# Patient Record
Sex: Male | Born: 1955 | Race: White | Hispanic: No | Marital: Married | Smoking: Never smoker
Health system: Southern US, Community
[De-identification: ages and names within clinical notes are randomized; demographics above are authoritative.]

## PROBLEM LIST (undated history)

## (undated) DIAGNOSIS — I10 Essential (primary) hypertension: Secondary | ICD-10-CM

## (undated) DIAGNOSIS — Z9861 Coronary angioplasty status: Secondary | ICD-10-CM

## (undated) DIAGNOSIS — C801 Malignant (primary) neoplasm, unspecified: Secondary | ICD-10-CM

## (undated) DIAGNOSIS — K219 Gastro-esophageal reflux disease without esophagitis: Secondary | ICD-10-CM

## (undated) DIAGNOSIS — I341 Nonrheumatic mitral (valve) prolapse: Secondary | ICD-10-CM

## (undated) DIAGNOSIS — Z9289 Personal history of other medical treatment: Secondary | ICD-10-CM

## (undated) DIAGNOSIS — E785 Hyperlipidemia, unspecified: Secondary | ICD-10-CM

## (undated) DIAGNOSIS — I2510 Atherosclerotic heart disease of native coronary artery without angina pectoris: Secondary | ICD-10-CM

## (undated) DIAGNOSIS — Z923 Personal history of irradiation: Secondary | ICD-10-CM

## (undated) DIAGNOSIS — I2109 ST elevation (STEMI) myocardial infarction involving other coronary artery of anterior wall: Secondary | ICD-10-CM

## (undated) DIAGNOSIS — Z9889 Other specified postprocedural states: Secondary | ICD-10-CM

## (undated) DIAGNOSIS — I340 Nonrheumatic mitral (valve) insufficiency: Secondary | ICD-10-CM

## (undated) DIAGNOSIS — R112 Nausea with vomiting, unspecified: Secondary | ICD-10-CM

## (undated) DIAGNOSIS — R911 Solitary pulmonary nodule: Secondary | ICD-10-CM

## (undated) DIAGNOSIS — C3491 Malignant neoplasm of unspecified part of right bronchus or lung: Secondary | ICD-10-CM

## (undated) HISTORY — DX: Malignant neoplasm of unspecified part of right bronchus or lung: C34.91

## (undated) HISTORY — DX: Gastro-esophageal reflux disease without esophagitis: K21.9

## (undated) HISTORY — DX: Other specified postprocedural states: Z98.890

## (undated) HISTORY — PX: COLONOSCOPY: SHX174

## (undated) HISTORY — DX: Hyperlipidemia, unspecified: E78.5

## (undated) HISTORY — DX: Solitary pulmonary nodule: R91.1

## (undated) HISTORY — DX: Nonrheumatic mitral (valve) prolapse: I34.1

## (undated) HISTORY — DX: ST elevation (STEMI) myocardial infarction involving other coronary artery of anterior wall: I21.09

## (undated) HISTORY — DX: Atherosclerotic heart disease of native coronary artery without angina pectoris: I25.10

## (undated) HISTORY — DX: Nonrheumatic mitral (valve) insufficiency: I34.0

## (undated) HISTORY — PX: CARDIAC CATHETERIZATION: SHX172

## (undated) HISTORY — DX: Coronary angioplasty status: Z98.61

## (undated) HISTORY — DX: Essential (primary) hypertension: I10

## (undated) HISTORY — DX: Personal history of other medical treatment: Z92.89

---
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
## 1991-08-15 HISTORY — PX: ANTERIOR CRUCIATE LIGAMENT REPAIR: SHX115

## 2002-08-14 DIAGNOSIS — I2510 Atherosclerotic heart disease of native coronary artery without angina pectoris: Secondary | ICD-10-CM

## 2002-08-14 DIAGNOSIS — I2109 ST elevation (STEMI) myocardial infarction involving other coronary artery of anterior wall: Secondary | ICD-10-CM

## 2002-08-14 DIAGNOSIS — Z9861 Coronary angioplasty status: Secondary | ICD-10-CM

## 2002-08-14 HISTORY — DX: Coronary angioplasty status: Z98.61

## 2002-08-14 HISTORY — DX: Atherosclerotic heart disease of native coronary artery without angina pectoris: I25.10

## 2002-08-14 HISTORY — DX: ST elevation (STEMI) myocardial infarction involving other coronary artery of anterior wall: I21.09

## 2002-08-14 HISTORY — PX: CARDIAC CATHETERIZATION: SHX172

## 2002-11-06 ENCOUNTER — Inpatient Hospital Stay (HOSPITAL_COMMUNITY): Admission: EM | Admit: 2002-11-06 | Discharge: 2002-11-09 | Payer: Self-pay | Admitting: Emergency Medicine

## 2002-11-10 ENCOUNTER — Inpatient Hospital Stay (HOSPITAL_COMMUNITY): Admission: EM | Admit: 2002-11-10 | Discharge: 2002-11-11 | Payer: Self-pay | Admitting: Emergency Medicine

## 2004-09-13 ENCOUNTER — Encounter: Admission: RE | Admit: 2004-09-13 | Discharge: 2004-09-13 | Payer: Self-pay | Admitting: Family Medicine

## 2006-05-07 ENCOUNTER — Observation Stay (HOSPITAL_COMMUNITY): Admission: EM | Admit: 2006-05-07 | Discharge: 2006-05-07 | Payer: Self-pay | Admitting: Emergency Medicine

## 2009-03-24 ENCOUNTER — Ambulatory Visit: Payer: Self-pay | Admitting: Sports Medicine

## 2009-03-24 DIAGNOSIS — M7750 Other enthesopathy of unspecified foot: Secondary | ICD-10-CM | POA: Insufficient documentation

## 2009-04-29 ENCOUNTER — Ambulatory Visit: Payer: Self-pay | Admitting: Sports Medicine

## 2009-12-12 HISTORY — PX: NM MYOVIEW LTD: HXRAD82

## 2010-12-30 NOTE — Discharge Summary (Signed)
NAME:  [name], [name] NO.:  [id number]   MEDICAL RECORD NO.:  [id number]          PATIENT TYPE:  INP   LOCATION:  2807                         FACILITY:  [facility]   PHYSICIAN:  Cristy Hilts. Jacinto Halim, MD       DATE OF BIRTH:  06/18/1956   DATE OF ADMISSION:  05/07/2006  DATE OF DISCHARGE:  05/07/2006                                 DISCHARGE SUMMARY   HISTORY:  The patient was never actually admitted.  [name] is a 55-year-  old white married male patient of Dr. Lenise Herald who came to the  hospital who complains of chest pressure, shortness of breath,  lightheadedness and bilateral arm heaviness.  He states it started over the  weekend when he mowed his grass.  He had to keep stopping.  He felt like  something was standing on his chest.  He rated it a 6/10.  He played golf  and he had no symptoms Sunday; he was just very tired.  Today at work he was  just sitting and symptoms recurred.  He did not try to take any  nitroglycerin.  He called the office and he was told to come to the ER.  His  symptoms with his previous MI were some mild lightheadedness and eventual  bilateral arm numbness.  He was seen by Dr. Jacinto Halim, who decided to take him  to the cath lab.  He underwent cardiac catheterization.  He had no  significant change in his coronary anatomy since 2004.  He has a LAD stent  that was widely patent.  It was a Cypher stent.  He had no thrombus.  He had  a distal 40% lesion in his LAD.  He had a 50% lesion in the side branch of  his RCA and a 30% lesion in his midcircumflex.  He was Perclosed and decided  that he could go home that evening.   LABORATORY:  His hemoglobin was 14.5, hematocrit 41, WBCs 4.4, platelets  were 204.  His sodium is 141, potassium 4.1, BUN 13, creatinine 0.9.  AST  was 32, ALT was 21.  TSH was 1.004.  I do not see a chest x-ray report in  this chart at the time of this dictation.   DISCHARGE MEDICATIONS:  Same meds as previously prior to  hospitalization.  His activity is restricted for a week.  He cannot do any strenuous lifting,  pushing, pulling or exercise.   DIAGNOSIS:  1. Chest pain not cardiac ischemia, etiology unknown.  2. Known coronary artery disease.  3. Mitral valve prolapse found on the cardiac catheterization.  He has a      normal ejection fraction.  4. Dyslipidemia.  5. Gastroesophageal reflux disease, previously on Prilosec.      Alan [name], N.P.      Cristy Hilts. Jacinto Halim, MD  Electronically Signed    BB/MEDQ  D:  06/01/2006  T:  06/02/2006  Job:  [id number]   cc:   L. Lupe Carney, M.D.  Darlin Priestly, MD

## 2010-12-30 NOTE — Discharge Summary (Signed)
NAME:  Alan [name], Alan [name]                        ACCOUNT NO.:  [id number]   MEDICAL RECORD NO.:  [id number]                   PATIENT TYPE:  INP   LOCATION:  4733                                 FACILITY:  [facility]   PHYSICIAN:  Darlin Priestly, M.D.             DATE OF BIRTH:  07/16/1956   DATE OF ADMISSION:  11/10/2002  DATE OF DISCHARGE:  11/11/2002                                 DISCHARGE SUMMARY   DISCHARGE DIAGNOSES:  1. Orthostatic hypotensive and syncope.  2. Coronary artery disease with recent myocardial infarction secondary to     left anterior descending artery occlusion, treated with left anterior     descending artery stenting.  3. Mild left ventricle dysfunction with an ejection fraction of 40 to 45%.  4. PENICILLIN allergy.  5. Hyperlipidemia.   HOSPITAL COURSE:  The patient is a 55 year old male who was discharged on  November 09, 2002 after an MI treated with LAD Cypher stenting.  His EF was 40  to 45%.  Prior to his catheterization he did have two episodes of VF and was  shocked.  He was discharged November 09, 2002.  That night he had an episode of  orthostatic dizziness.  On the day of admission, November 10, 2002 he had frank  syncope after getting up.  He did hit his right lateral chest.  He was seen  in the emergency room and admitted.  His EKG showed sinus rhythm without  acute changes.  CK-MB were negative although his troponin is still elevated  from his MI.  Hemoglobin was 15.  He was given IV fluids and his Altace was  held and Toprol cut in half.  On November 11, 2002 he is up, ambulating without  problems and has had no further orthostatic symptoms.  We feel he can be  discharged.  Dr. Jacinto Halim would like to get an outpatient echocardiogram.  The  patient will be discharged on November 11, 2002.   DISCHARGE MEDICATIONS:  1. Aspirin 81 mg q.d.  2. Plavix 75 mg q.d.  3. Toprol XL 12.5 mg q.d.  4. Lipitor 20 mg q.d.  5. Protonix 40 mg q.d.  6. Vioxx p.r.n.   LABORATORY DATA:  White count 6.7, hemoglobin 15.1, hematocrit 43.5,  platelets 213.  Sodium 131, potassium 4.1, BUN 18, creatinine 0.8, glucose  91.  INR 0.9.  CK 133, MB 2.2, troponin still elevated at 1.29.  His  troponin on November 08, 2002 was 4.16.   Rib films were normal showing no fracture.  EKG showed sinus rhythm without  acute changes.  His Q-Tc is 423.   DISPOSITION:  The patient is discharged in stable condition.  He has been  instructed not to drive or do anything strenuous for the next few days.  He  will stop his Altace and we cut his Toprol to 12.5 mg a day.  He will have  an echocardiogram and  see Dr. Jenne Campus on the same day, December 02, 2002.     Abelino Derrick, P.A.                      Darlin Priestly, M.D.   Lenard Lance  D:  11/11/2002  T:  11/11/2002  Job:  [id number]

## 2010-12-30 NOTE — Discharge Summary (Signed)
NAME:  Alan [name], Alan [name]                        ACCOUNT NO.:  [id number]   MEDICAL RECORD NO.:  [id number]                   PATIENT TYPE:  INP   LOCATION:  3703                                 FACILITY:  [facility]   PHYSICIAN:  Darlin Priestly, M.D.             DATE OF BIRTH:  26-Oct-1955   DATE OF ADMISSION:  11/06/2002  DATE OF DISCHARGE:  11/09/2002                                 DISCHARGE SUMMARY   ADMISSION DIAGNOSES:  1. Acute myocardial infarction.  2. Family history of coronary artery disease.   DISCHARGE DIAGNOSES:  1. Acute myocardial infarction.  2. Family history of coronary artery disease.  3. Status post emergent cardiac catheterization on November 06, 2002 by Dr.     Lenise Herald.  The left anterior descending was found to have proximal     thrombus and was 99% occluded in that region.  Dr. Jenne Campus performed     AngioJet x2 runs there with intravascular ultrasound-guided intervention.     He performed percutaneous transluminal coronary angioplasty and stent     with two Cypher stents to the proximal left anterior descending.  There     was some other residual coronary disease that was planned to continue     medical therapy for.  Ejection fraction was 40%-45%.   HISTORY OF PRESENT ILLNESS:  The patient is a pleasant 55 year old white  male who had no prior medical history who presented to the emergency room on  November 06, 2002 with complaints of abrupt onset of chest pain after running  3.5 miles that afternoon.  He normally runs about 2-3 miles per day without  any significant symptoms; however, after running on this evening he  developed left chest pain with shortness of breath which radiated into both  arms.  As well, he did have associated nausea and diaphoresis.  He attempted  to drive himself to [facility] but had to stop secondary to  recurrent and ongoing chest pain and therefore called EMS.  He had no prior  history of chest pain or heart failure.   He was given two sublingual  nitroglycerin by EMS with brief relief of his chest pain, which returned  again in the ER.  At the time of Dr. Mikey Bussing evaluation, he was  complaining of chest pain radiating to the left arm despite being in the ER  on IV nitroglycerin.  There was no history of peptic ulcer disease or GI  bleeds.  No significant known risk factors for coronary disease.  At that  time, he was stable with a heart rate of 87, blood pressure 118/83, oxygen  saturation 99% on room air.  No significant abnormalities to physical exam.  Labs were pending.  EKG shows normal sinus rhythm and anterior ST elevation.   At that time, he was evaluated by Dr. Lenise Herald.  It was felt that he  needed to proceed with urgent cardiac  catheterization.  Risks and benefits  of the procedure were discussed and the patient was willing to proceed.  At  that point, he was continued with IV heparin, IV nitroglycerin, and aspirin  therapy.  Family was at the bedside and was agreeable.   HOSPITAL COURSE:  On November 06, 2002, the patient underwent urgent cardiac  catheterization by Dr. Lenise Herald.  Please see the dictated report for  details.  It was felt that the culprit lesion was an area in the proximal  LAD that had thrombus and it was 99% stenosed.  He performed AngioJet x2  runs there.  He performed IVUS of the vessel.  He proceeded with stenting x2  with two Cypher stents.  As well, there was PTCA with a Maverick.  Planned  to continue Integrilin for 18 hours post intervention.  Plavix for six  months.  We would add statin, beta-blocker, ACE inhibitor, and proton pump  in addition to his other heart medicines that we had begun.  EF was 40%-45%  with anterior and apical hypokinesis, mild MR.  The patient tolerated the  procedure with no complications.  During the heart catheterization  procedure, the patient had ventricular fibrillation x2 with successful  cardioversion x2 at 200 joules.    On that evening, just a little after midnight, the patient complained of  nausea, feeling lightheaded.  Cold washcloths were administered to the  forehead and neck and the patient was feeling better; however, shortly  thereafter the sheath was pulled and the patient complained of nausea and  feeling faint.  Blood pressure was 88/45, heart rate 70.  They gave an amp  of atropine with blood pressure increased to 104/60 and heart rate 64.  This  was followed by Zofran.   The following day, on November 07, 2002, he had no other arrhythmias and was  continued on IV amiodarone. At that time, Dr. Jenne Campus planned to stop the IV  amiodarone.  As well, it was thought that the patient would need followup  evaluation of the PDA disease in several weeks.   On November 08, 2002, the patient remained stable.  He was having no further  chest pain and was maintaining sinus rhythm.  We continued to adjust  medications and monitor him post MI.   On November 09, 2002, again the patient was stable with no chest pain or  shortness of breath.  He was tolerating full activity with rehab with no  problems.  He was afebrile at 98.1, pulse 65, blood pressure 108/70, oxygen  saturation 95% on room air.  Labs have remained stable.  He has maintained  sinus rhythm with no arrhythmias.  His exam is stable and there is no bleed  or hematoma at the groin site.   At this time, he is seen and evaluated by Dr. Charolette Child, who deems him  stable for discharge home.   CONSULTATIONS:  None.   PROCEDURES:  Emergent cardiac catheterization on November 06, 2002 by Dr.  Lenise Herald.  Please see the dictated report for details.  The patient  was found to have the following:  The right coronary artery was free of  significant disease; however, the posterior descending artery branch has a  90% proximal stenosis.  The left main was free of significant disease.  The left circumflex and its branches are also free of significant  disease;  however, the left anterior descending in its proximal portion has  significant thrombus and is 99% stenosed.  Distal to this it has a 40%  stenosis.  The first diagonal vessel is small but has an ostial 70%  stenosis.  Ejection fraction was estimated at 40%-45% with anterior and  apical hypokinesis.  Mild mitral regurgitation.  Dr. Jenne Campus proceeded with  AngioJet x2 runs to the proximal left anterior descending.  He then  performed intravascular ultrasound interrogation.  He then proceeded with  stenting x2 with Cypher stents.  As well, he used Maverick balloons.  Note  that the patient did have ventricular fibrillation x2 with successful shock  x2 at 200 joules.  As well, apparently amiodarone was subsequently begun.  At that time, Dr. Jenne Campus planned to continue Integrilin for 18 hours post  intervention.  Planned to continue Plavix for six months post drug-eluting  stents.  Start statin, beta-blocker, ACE inhibitor, proton pump, in addition  to the aspirin that was already started.   LABORATORY DATA:  TSH normal at 1.112.  Lipid profile showed total  cholesterol 184, triglycerides 55, HDL 50, LDL 123.  Cardiac enzymes showed  CK's of 1035, 781.  CK-MB's were 62.3, 14.2.  Troponin-I 7.56, 4.16.  Electrolytes showed sodium 141, potassium 2.7 which was repleted up to 3.8.  Glucose 141, BUN 20, creatinine 0.8.  These remained stable.  White count  8.4, hemoglobin 14.4, hematocrit 40.8, and platelets 210.  Pro time 13.1,  INR 0.9, PTT 28.   EKG on admission shows normal sinus rhythm, 85 beats per minute.  There was  about 2-3 mm of ST elevation in V2 and about 4 mm of ST elevation in V3-V4.  There is ST depression in leads II, III, aVF, and scooping effect there.   Followup EKG on November 06, 2002 post intervention is much improved.   DISCHARGE MEDICATIONS:  1. Enteric-coated aspirin 325 mg once a day.  2. Plavix 75 mg once a day.  3. Toprol XL 25 mg once a day.  4. Protonix  40 mg once a day for one month.  5. Lipitor 20 mg once a day.  6. Altace 5 mg once a day.  7. Nitroglycerin 0.4 mg sublingual as directed for chest pain.   ACTIVITY:  No strenuous activity, lifting greater than 5 pounds, driving, or  sexual activity for three days.   DIET:  Low-salt/low-fat/low-cholesterol diet.   WOUND CARE:  May gently wash the groin site with warm water and soap.  Call  [phone] for any bleeding or increased size or pain of the groin site.   FOLLOW UP:  On Monday, call [phone] to make an appointment to see Dr.  Jenne Campus in two weeks.   On an outpatient basis, in about 4-6 months, Dr. Jenne Campus had planned to  proceed with possible Cardiolite to evaluate the posterior descending artery  territory.     Mary B. Easley, P.A.-C.                   Darlin Priestly, M.D.    MBE/MEDQ  D:  12/01/2002  T:  12/02/2002  Job:  [id number]

## 2010-12-30 NOTE — Cardiovascular Report (Signed)
NAME:  JAMARRIUS, SALAY NO.:  [id number]   MEDICAL RECORD NO.:  [id number]          PATIENT TYPE:  INP   LOCATION:  2807                         FACILITY:  [facility]   PHYSICIAN:  Cristy Hilts. Jacinto Halim, MD       DATE OF BIRTH:  02-Jun-1956   DATE OF PROCEDURE:  05/07/2006  DATE OF DISCHARGE:  05/07/2006                              CARDIAC CATHETERIZATION   REFERRING PHYSICIAN:  L. Lupe Carney, M.D.   PROCEDURE PERFORMED:  1. Left ventriculography.  2. Selective right and left coronary arteriography.  3. Right femoral angiography and closure of the right femoral arterial      access with StarClose.   INDICATIONS:  Mr. Marty Uy is a 55 year old gentleman with history of  known coronary artery disease with acute anterior wall myocardial infarction  in March 2004.  He has had implantation of a 3.5 x 18 mm Cypher into the mid-  LAD, and a 3.5 x 8 mm Cypher into the proximal LAD, overlapping stents.  He  now was readmitted to the hospital through the emergency room with chest  pain.  Given his atypical presentation previously, and because of worrisome  symptoms, and also the patient's concern, he was brought to the cardiac  catheterization lab for direct evaluation of his coronary anatomy for  progression of coronary disease.   HEMODYNAMIC DATA:  The left ventricular pressures were 116/3, with end-  diastolic pressure of 14 mmHg.  The aortic pressures was 114/74, with a mean  of 92 mmHg.  There was no pressure gradient across the aortic valve.   ANGIOGRAPHIC DATA:  Left ventricle:  The LV systolic function was normal,  with ejection fraction of 55%.  There was mild mid to distal anterolateral  and apical hypokinesis.  There was mild mitral regurgitation, with evidence  of mild mitral valve prolapse.   Right coronary artery:  Right coronary artery is a large-caliber vessel and  a dominant vessel.  It has mild luminal irregularity.  There is a large PDA  and a moderate  to large PLA.  The PLA secondary branch has a 50% to at most  60% stenosis in its ostium, which is significantly improved from around 90%  from previous cardiac catheterization.   Left main:  Left main is a large-caliber vessel.  It is smooth and normal.   Circumflex:  Circumflex is a moderate-caliber vessel.  It has mild luminal  irregularity.   LAD:  LAD is a large-caliber vessel.  The previously placed stent is widely  opened.  The LAD gives origin to several small diagonals.  They are widely  patent.  There is a 40% stenosis in the distal LAD, and there is also a kink  noted at the same region.  However, this lesion did not appear to be  significant.   IMPRESSION:  1. No significant change in coronary anatomy since 2004.  The previously      placed Cypher stents into the proximal and mid-LAD are widely patent.  2. The chest pain could be secondary to mild coronary vasospasm versus  GERD.   RECOMMENDATIONS:  Will add 30 mg of Imdur to his current regimen, and ask  the patient to take Protonix one hour prior to his meal.  I will take him  off of work for a week, and he can return to work the following Monday.  He  will follow up with Dr. Lenise Herald in two weeks.   A total of 120 cc of contrast was utilized for diagnostic angiography.   TECHNIQUE OF THE PROCEDURE:  Under usual sterile precautions, using a 6  French right femoral arterial access, a 6 [place] multipurpose B2 catheter  was advanced into the ascending aorta over a 0.035 J-wire.  The catheter was  gently advanced to the left ventricle.  Left ventricular pressures were  monitored.  Hand contrast injection of the left ventricle was performed both  in LAO and RAO projections.  The catheter was flushed with saline, pulled  back into the ascending aorta, and the pressure gradient across the aortic  valve was monitored.  Right coronary artery was selectively engaged, and  angiography was performed.  Then, the left  main coronary artery was  selectively engaged, and angiography was performed.  Because of inadequate  visualization, a 6 French Judkins left side diagnostic catheter was advanced  and engaged in the left main coronary artery.  Angiography was repeated.  Then, the catheter was pulled out of the body in the usual fashion. Right  femoral angiography was performed through the arterial access sheath, and  the access was closed with StarClose, with excellent hemostasis.  The  patient tolerated the procedure well.  There were no immediate complications  reported.      Cristy Hilts. Jacinto Halim, MD  Electronically Signed     JRG/MEDQ  D:  05/07/2006  T:  05/09/2006  Job:  [id number]   cc:   L. Lupe Carney, M.D.  Darlin Priestly, MD

## 2010-12-30 NOTE — Cardiovascular Report (Signed)
NAME:  Alan [name], Alan [name]                        ACCOUNT NO.:  [id number]   MEDICAL RECORD NO.:  [id number]                   PATIENT TYPE:  INP   LOCATION:  1823                                 FACILITY:  [facility]   PHYSICIAN:  Darlin Priestly, M.D.             DATE OF BIRTH:  [birth year]   DATE OF PROCEDURE:  11/06/2002  DATE OF DISCHARGE:                              CARDIAC CATHETERIZATION   PROCEDURES:  1. Left heart catheterization.  2. Coronary angiography.  3. Left ventriculogram.  4. Insertion of temporary transvenous pacer wire.  5. Left anterior descending, proximal:     a. AngioJet rheolytic therapy.     b. Intravascular ultrasound imaging.     c. Placement of intracoronary stent x2.     d. Percutaneous transluminal coronary angioplasty.   SURGEON:  Darlin Priestly, M.D.   COMPLICATIONS:  None.   The patient did have two VF episodes in the catheterization lab, which were  successfully shocked at 200 joules to sinus rhythm.   INDICATIONS:  The patient is a 54 year old male with no significant previous  medical history.  The patient was running at approximately 4 p.m. when he  developed substernal chest pain approximately 5 p.m. radiating into both  arms with shortness of breath, nausea, diaphoresis.  He subsequently  presented to the ER, where he was found to have anterior ST elevation.  He  is now brought emergently for percutaneous intervention.   DESCRIPTION OF PROCEDURE:  After getting formal written consent, the patient  was brought to the cardiac catheterization, where the right and left groin  were shaved, prepped, and draped in the usual sterile fashion.  The  procedure was begun with a stab incision and modified Seldinger technique, a  #6 French arterial sheath was inserted in the right femoral vein and a #7  [place] arterial sheath inserted in the right femoral artery.  Next a  transvenous pacing wire was then floated into the RV apex and pacing  thresholds were then determined.  Next a #6 [place] JR4 diagnostic catheter  was then used to coaxially engage the right coronary ostium.  This revealed  a large right coronary artery which was dominant and gave rise to both a PDA  as well as a posterolateral branch.  There was no significant disease in the  RCA.  The PDA was a medium-sized vessel with no significant disease.  The  PLA was a large vessel which bifurcated proximally and had 90% ostial lesion  in the lower bifurcation.   The right coronary catheter was then exchanged for a #7 [place] guiding  catheter, which was then coaxially engaged in the left coronary ostium.  Selective angiography was the performed.  This reveals a large left main  with no significant disease.  The LAD was a large vessel, which was  subtotally occluded in its proximal portion with a large thrombus burden  proximally.  There  was TIMI 1-2 flow into the mid- and distal portion of the  LAD.  There was a 40% mid-LAD stenotic lesion beyond the second diagonal.  The first diagonal was a small vessel with 70% ostial lesion.  The second  diagonal was a large vessel which bifurcates in its midsegment and has no  significant disease.   The circumflex was a large vessel that coursed in the AV groove and, and  there were three obtuse marginal branches.  There was no significant disease  in the AV groove circumflex.  The first OM was a large vessel which is  irregular but has no high-grade stenosis.  The second OM is of medium size  and bifurcates in the midsegment and has no stenosis.  The third OM is a  large vessel with a 30% proximal lesion.   Left ventriculogram reveals mildly depressed EF of 40-45% with anterior,  lateral, and apical hypokinesis.  There is mild mitral regurgitation.   HEMODYNAMICS:  Systemic arterial pressure 107/70, LV pressure 114/8, LVEDP  of 21.   INTERVENTIONAL PROCEDURE:  LAD, proximal:  Following diagnostic angiography,  a 0.014  short Forte guidewire was advanced out of the guiding catheter and  easily passed into the mid- and apical LAD.  Next a 4 [place] AngioJet  rheolytic catheter was then positioned in the mid-LAD and two pullbacks were  then performed.  Follow-up angiogram revealed excellent thrombus burden  removal.  There was noted to be increased spasm in the mid-LAD.  This  catheter was then removed and the intravascular ultrasound imaging 40  megahertz catheter was then advanced to the mid-LAD and mechanical pullback  was performed.  The LAD in its midsegment appeared to be approximately 3-3.5  luminal diameter.  There was plaque rupture in the proximal LAD with a 4-4.2  vessel diameter.  The IVUS imaging catheter was removed and a Cypher 3.5 x  18 mm stent was then positioned across the mid-LAD stenotic lesion.  This  stent was employed to a maximum of 18 atmospheres for a total of one minute.  Follow-up angiogram revealed excellent luminal gain; however, there were  some mild irregularities to the proximal portion of the stent.  This stent  was then post-dilated using a Maverick 4.0 x 15 mm balloon taken to 12  atmospheres for a total luminal diameter of 4.28.  This balloon was then  removed and a second Cypher 3.5 x 8 mm stent was then carefully placed in  the proximal LAD to overlap the previously-placed stent.  This stent was  then deployed to a maximum of 18 atmospheres.  The stent balloon was then  removed and the Maverick balloon was then reinserted and positioned across  the overlapping segment.  An additional inflation was then performed to a  maximum of 12 atmospheres.  This balloon was then pulled back to cover the  proximal portion of the proximal Cypher stent and an additional inflation to  12 atmospheres was then performed again, for a luminal diameter of 4.28. Follow-up angiogram revealed no evidence of dissection or thrombus with TIMI-  3 flow through the distal vessel.  IV heparin was  used throughout the case  to maintain an ACT of between 200 and 300.  IV Integrilin was used  throughout the case.   Final orthogonal angiograms revealed less than 10% residual stenosis in the  proximal LAD stenotic lesion with TIMI-3 flow through the distal vessel.  This concluded the procedure.  All balloons and __________  catheters were  removed.  Hemostatic sheath was sewn into place and the patient transferred  back to the ward in stable condition.   CONCLUSION:  1. Successful AngioJet rheolytic therapy with intravascular ultrasound     imaging and placement of two intracoronary stents (Cypher 3.5 x 18,     Cypher 3.5 x 8) in the proximal left anterior descending stenotic lesion.  2. Post-dilatation of the proximal Cypher stents to 4.28 diameter using a     Maverick 4.0 x 15 mm balloon.  3. A 90% stenosis of lower branch of the posterolateral branch.  4. Mildly depressed left ventricular systolic function with wall motion     abnormality as noted above.  5. Mild mitral regurgitation.  6. Adjunct use of Integrilin infusion.                                               Darlin Priestly, M.D.    RHM/MEDQ  D:  11/06/2002  T:  11/08/2002  Job:  [id number]

## 2011-09-14 ENCOUNTER — Encounter (HOSPITAL_COMMUNITY): Payer: Self-pay | Admitting: Respiratory Therapy

## 2011-09-14 ENCOUNTER — Other Ambulatory Visit: Payer: Self-pay | Admitting: Cardiovascular Disease

## 2011-09-15 ENCOUNTER — Ambulatory Visit (HOSPITAL_COMMUNITY)
Admission: RE | Admit: 2011-09-15 | Discharge: 2011-09-15 | Disposition: A | Discharge status: Home / Self Care | Payer: Federal, State, Local not specified - PPO | Source: Ambulatory Visit | Attending: Cardiovascular Disease | Admitting: Cardiovascular Disease

## 2011-09-15 ENCOUNTER — Encounter (HOSPITAL_COMMUNITY)
Admission: RE | Disposition: A | Discharge status: Home / Self Care | Payer: Self-pay | Source: Ambulatory Visit | Attending: Cardiovascular Disease

## 2011-09-15 DIAGNOSIS — R0789 Other chest pain: Secondary | ICD-10-CM | POA: Insufficient documentation

## 2011-09-15 DIAGNOSIS — Z79899 Other long term (current) drug therapy: Secondary | ICD-10-CM | POA: Insufficient documentation

## 2011-09-15 DIAGNOSIS — R0602 Shortness of breath: Secondary | ICD-10-CM | POA: Insufficient documentation

## 2011-09-15 DIAGNOSIS — E785 Hyperlipidemia, unspecified: Secondary | ICD-10-CM | POA: Insufficient documentation

## 2011-09-15 DIAGNOSIS — Z7902 Long term (current) use of antithrombotics/antiplatelets: Secondary | ICD-10-CM | POA: Insufficient documentation

## 2011-09-15 DIAGNOSIS — I2510 Atherosclerotic heart disease of native coronary artery without angina pectoris: Secondary | ICD-10-CM | POA: Insufficient documentation

## 2011-09-15 DIAGNOSIS — I252 Old myocardial infarction: Secondary | ICD-10-CM | POA: Insufficient documentation

## 2011-09-15 DIAGNOSIS — Z7982 Long term (current) use of aspirin: Secondary | ICD-10-CM | POA: Insufficient documentation

## 2011-09-15 HISTORY — PX: LEFT HEART CATHETERIZATION WITH CORONARY ANGIOGRAM: SHX5451

## 2011-09-15 SURGERY — LEFT HEART CATHETERIZATION WITH CORONARY ANGIOGRAM
Anesthesia: LOCAL

## 2011-09-15 MED ORDER — SODIUM CHLORIDE 0.9 % IV SOLN: INTRAVENOUS | Status: AC

## 2011-09-15 MED ORDER — ONDANSETRON HCL 4 MG/2ML IJ SOLN: 4.0000 mg | Freq: Four times a day (QID) | INTRAMUSCULAR | Status: DC | PRN

## 2011-09-15 MED ORDER — SODIUM CHLORIDE 0.9 % IV SOLN: INTRAVENOUS | Status: DC

## 2011-09-15 MED ORDER — SODIUM CHLORIDE 0.9 % IJ SOLN: 3.0000 mL | INTRAMUSCULAR | Status: DC | PRN

## 2011-09-15 MED ORDER — NITROGLYCERIN 0.2 MG/ML ON CALL CATH LAB
INTRAVENOUS | Status: AC
  Filled 2011-09-15: qty 1

## 2011-09-15 MED ORDER — ACETAMINOPHEN 325 MG PO TABS: 650.0000 mg | ORAL_TABLET | ORAL | Status: DC | PRN

## 2011-09-15 MED ORDER — DIAZEPAM 5 MG PO TABS
5.0000 mg | ORAL_TABLET | ORAL | Status: AC
  Administered 2011-09-15: 5 mg via ORAL
  Filled 2011-09-15: qty 1

## 2011-09-15 MED ORDER — HEPARIN (PORCINE) IN NACL 2-0.9 UNIT/ML-% IJ SOLN
INTRAMUSCULAR | Status: AC
  Filled 2011-09-15: qty 2000

## 2011-09-15 MED ORDER — LIDOCAINE HCL (PF) 1 % IJ SOLN
INTRAMUSCULAR | Status: AC
  Filled 2011-09-15: qty 30

## 2011-09-15 MED ORDER — SODIUM CHLORIDE 0.9 % IV SOLN
250.0000 mL | INTRAVENOUS | Status: DC | PRN
  Administered 2011-09-15: 1000 mL via INTRAVENOUS

## 2011-09-15 NOTE — Op Note (Signed)
DAYLN TUGWELL is a 56 y.o. male    [id number] LOCATION:  FACILITY: [facility]  PHYSICIAN: Nanetta Batty, M.D. Dec 26, 1955   DATE OF PROCEDURE:  09/15/2011  DATE OF DISCHARGE:  [facility]  CARDIAC CATHETERIZATION     History obtained from chart review. Mr. Sorg is a 56 year old Caucasian male father of one son who seen by Dr. Gerlene Burdock went up in the office 09/14/2011. History of anterior wall myocardial infarction treated with urgent PCI by Dr. Lenise Herald in March of 2004. He had 2 overlapping DES stents placed at that time which was IVUS guided. He was restudied in 2007 by Dr. Yates Decamp  Demonstrated that his stents were widely patent patent.. His other problems include hyperlipidemia. 2 episodes of chest pain or shortness of breath and was seen by Dr. Alanda Amass for diagnostic coronary arteriography was testitis diagnostic method to evaluate his symptoms.   PROCEDURE DESCRIPTION:    The patient was brought to the second floor  [facility] lab in the postabsorptive state. He was  premedicated with 5 mg of Valium by mouth. His right groin was prepped and shaved in usual sterile fashion. Xylocaine 1% was used  for local anesthesia. A 5 French sheath was inserted into the right common femoral  artery using standard Seldinger technique.5 French right and left Judkins diagnostic catheters along with a 5 French pigtail catheter were used for selective coronary angiography, left ventriculography respectively. Visipaque dye was used for the entirety of the case. Retrograde aortic, left ventricular pressures were recorded.    HEMODYNAMICS:    AO SYSTOLIC/AO DIASTOLIC: 109/69   LV SYSTOLIC/LV DIASTOLIC: 108/8  ANGIOGRAPHIC RESULTS:   1. Left main; normal  2. LAD; widely patent proximal LAD stent 3. Left circumflex; normal.  4. Right coronary artery; mild irregularities in the posterior descending artery 5. Left ventriculography; RAO left  ventriculogram was performed using  25 mL of Visipaque dye at 12 mL/second. The overall LVEF estimated  50 %  With wall motion abnormalities notable for very subtle anterior hypokinesia  IMPRESSION:Mr. Longoria has essentially normal coronary arteries and normal left ventricular function. His proximal LAD stent is widely patent. The etiology of his symptoms are most likely noncardiac. The sheath was removed and pressure was held on the groin to achieve hemostasis. Patient left the lab in stable condition. He will discharge her later today after remaining recumbent for 4  hours and will see Dr. Alanda Amass back in the office in followup.  Runell Gess MD, [facility] 09/15/2011 9:25 AM

## 2011-09-15 NOTE — H&P (Signed)
  H & P will be scanned in.  Pt was reexamined and existing H & P reviewed. No changes found.  Runell Gess, MD [facility] 09/15/2011 9:20 AM

## 2012-02-21 ENCOUNTER — Ambulatory Visit (INDEPENDENT_AMBULATORY_CARE_PROVIDER_SITE_OTHER): Payer: Federal, State, Local not specified - PPO | Admitting: Sports Medicine

## 2012-02-21 VITALS — BP 138/83 | Ht 76.0 in | Wt 185.0 lb

## 2012-02-21 DIAGNOSIS — M7740 Metatarsalgia, unspecified foot: Secondary | ICD-10-CM

## 2012-02-21 DIAGNOSIS — M2040 Other hammer toe(s) (acquired), unspecified foot: Secondary | ICD-10-CM

## 2012-02-21 DIAGNOSIS — M722 Plantar fascial fibromatosis: Secondary | ICD-10-CM

## 2012-02-21 DIAGNOSIS — M7750 Other enthesopathy of unspecified foot: Secondary | ICD-10-CM

## 2012-02-21 NOTE — Progress Notes (Signed)
  Subjective:    Patient ID: Alan [name], male    DOB: 04-14-1956, 56 y.o.   MRN: [id number]  HPI Pt with hx of hammer toes that comes today complaining of pain on right heel area for about one month. Pain is described as throbbing and is worst when changing activity from rest to initiation of walk, also when walking on hard surface. He runs on treadmill and has change to elliptical for about two weeks. The pain is tolerable while exercising but the next day gets worse. He also has tried sleeping with 90 degree angle boots and this helped at the beginning but there is no further improvement. He also has taken ibuprofen and tylenol with no relieve of his symptoms. He also described shooting tingling sensation on 3rd and 4th right toes for 3-4 months duration. Denies numbness or other symptoms.  Pt uses soft orthotics inserts due to his feet deformity that were custom made about 2-3 years ago and hard inserts on his regular shoes. He changes his exercise shoes every 6 months to a year.   Review of Systems Per HPI    Objective:   Physical Exam Awake alert and oriented x3, no acute distress. Well-developed and well-nourished. Right foot: Inspection of the right foot shows hammertoe deformity of the second third and fourth toes. He has some slight tenderness in between the metatarsal head of the third and fourth toes. He is tender to palpation at the calcaneal insertion of the plantar fascia. No pain with calcaneal squeeze. No erythema no soft tissue swelling. No tenderness along the Achilles tendon. He is neurovascular intact distally. Walking with a slight limp.  MSK ultrasound: Right plantar fascia was visualized with the probe in the longitudinal position. Measures 0.71 cm which compares to a measurement of 0.44 cm on the uninvolved right heel. Right plantar fascia also shows several hypoechoic areas consistent with edema and inflammatory changes. I do not appreciate a significant amount of  neovascularization.       Assessment & Plan:  #1. Right heel pain secondary to plantar fasciitis #2. Hammertoe deformity right foot #3. Right third toe discomfort possibly secondary to Morton's neuroma.  Patient was instructed in an extensive home exercise program including plantar fascial stretches and he centric exercises. Daily icing. Arch strap with activity. He orthotics that he has for his stress shoes are much too hard and I've recommended that he instead use his soft inserts in his tennis shoes. I've also given him new metatarsal pads for all of his inserts hoping this will improve his metatarsal pain. I recommended that he continue crosstraining until symptoms improve. Also continue with his night splint. Followup with me in 4-6 weeks for recheck. We discussed the possibility of cortisone injection if symptoms worsen.

## 2013-03-10 ENCOUNTER — Other Ambulatory Visit: Payer: Self-pay | Admitting: *Deleted

## 2013-03-10 MED ORDER — LOSARTAN POTASSIUM 25 MG PO TABS: 25.0000 mg | ORAL_TABLET | Freq: Every day | ORAL | Status: DC

## 2013-03-10 NOTE — Telephone Encounter (Signed)
Rx was sent to pharmacy electronically. 

## 2013-09-17 ENCOUNTER — Telehealth: Payer: Self-pay | Admitting: *Deleted

## 2013-09-17 NOTE — Telephone Encounter (Signed)
CLEARANCE FOR COLONOSCOPY 10/23/13 STOP PLAVIX 5 DAYS PRE-OP ,RESTART 1-2 DAYS POST-OP PER DH  FAXED  ON 09/16/13

## 2013-10-09 ENCOUNTER — Other Ambulatory Visit: Payer: Self-pay | Admitting: Cardiology

## 2013-10-10 ENCOUNTER — Other Ambulatory Visit: Payer: Self-pay | Admitting: Cardiology

## 2013-10-10 NOTE — Telephone Encounter (Signed)
Rx was sent to pharmacy electronically. 

## 2013-11-18 ENCOUNTER — Other Ambulatory Visit: Payer: Self-pay | Admitting: Cardiology

## 2013-11-19 NOTE — Telephone Encounter (Signed)
Rx was sent to pharmacy electronically. 

## 2013-11-25 ENCOUNTER — Encounter: Payer: Self-pay | Admitting: *Deleted

## 2013-11-27 ENCOUNTER — Ambulatory Visit (INDEPENDENT_AMBULATORY_CARE_PROVIDER_SITE_OTHER): Payer: Federal, State, Local not specified - PPO | Admitting: Cardiology

## 2013-11-27 ENCOUNTER — Encounter: Payer: Self-pay | Admitting: Cardiology

## 2013-11-27 VITALS — BP 110/62 | HR 68 | Ht 76.0 in | Wt 194.0 lb

## 2013-11-27 DIAGNOSIS — Z9889 Other specified postprocedural states: Secondary | ICD-10-CM

## 2013-11-27 DIAGNOSIS — I059 Rheumatic mitral valve disease, unspecified: Secondary | ICD-10-CM

## 2013-11-27 DIAGNOSIS — I341 Nonrheumatic mitral (valve) prolapse: Secondary | ICD-10-CM

## 2013-11-27 DIAGNOSIS — E785 Hyperlipidemia, unspecified: Secondary | ICD-10-CM

## 2013-11-27 DIAGNOSIS — Z9861 Coronary angioplasty status: Secondary | ICD-10-CM

## 2013-11-27 DIAGNOSIS — I2510 Atherosclerotic heart disease of native coronary artery without angina pectoris: Secondary | ICD-10-CM

## 2013-11-27 DIAGNOSIS — I2109 ST elevation (STEMI) myocardial infarction involving other coronary artery of anterior wall: Secondary | ICD-10-CM

## 2013-11-27 DIAGNOSIS — I10 Essential (primary) hypertension: Secondary | ICD-10-CM

## 2013-11-27 DIAGNOSIS — I340 Nonrheumatic mitral (valve) insufficiency: Secondary | ICD-10-CM

## 2013-11-27 NOTE — Patient Instructions (Signed)
Your physician has requested that you have an echocardiogram. Echocardiography is a painless test that uses sound waves to create images of your heart. It provides your doctor with information about the size and shape of your heart and how well your heart's chambers and valves are working. This procedure takes approximately one hour. There are no restrictions for this procedure.  Your physician wants you to follow-up in [place].  You will receive a reminder letter in the mail two months in advance. If you don't receive a letter, please call our office to schedule the follow-up appointment.  CONTINUE WITH MEDICATION

## 2013-11-29 ENCOUNTER — Encounter: Payer: Self-pay | Admitting: Cardiology

## 2013-11-29 DIAGNOSIS — I341 Nonrheumatic mitral (valve) prolapse: Secondary | ICD-10-CM | POA: Insufficient documentation

## 2013-11-29 DIAGNOSIS — E785 Hyperlipidemia, unspecified: Secondary | ICD-10-CM | POA: Insufficient documentation

## 2013-11-29 DIAGNOSIS — I2510 Atherosclerotic heart disease of native coronary artery without angina pectoris: Secondary | ICD-10-CM | POA: Insufficient documentation

## 2013-11-29 DIAGNOSIS — I340 Nonrheumatic mitral (valve) insufficiency: Secondary | ICD-10-CM | POA: Insufficient documentation

## 2013-11-29 DIAGNOSIS — Z9861 Coronary angioplasty status: Secondary | ICD-10-CM

## 2013-11-29 DIAGNOSIS — I10 Essential (primary) hypertension: Secondary | ICD-10-CM | POA: Insufficient documentation

## 2013-11-29 DIAGNOSIS — I2109 ST elevation (STEMI) myocardial infarction involving other coronary artery of anterior wall: Secondary | ICD-10-CM | POA: Insufficient documentation

## 2013-11-29 NOTE — Assessment & Plan Note (Signed)
He is not on a statin, but is taking fluids and eating. His cholesterol labs have been followed by his PCP. Unfortunately anatomy results recently.

## 2013-11-29 NOTE — Progress Notes (Signed)
PATIENT: Alan [name] MRN: [id number]  DOB: September 23, 1955   DOV:11/29/2013 PCP: Donnie Coffin, MD  Clinic Note: Chief Complaint  Patient presents with  . Follow-up    12 months follow-up    HPI: Alan [name] is a 58 y.o.  male with a PMH below who presents today for annual followup of CAD. He had a history of the anterior MI back in 2000 11/19/1944 In the possibility occlusion treated with 2 overlapping the DES stents (Cypher DES 3.37mm x 18 mm). He also has a history of mitral regurgitation with mitral prolapse. I last saw him in February of 2 is 14. He is doing quite well. He is an avid runner runs at least 3-4 days a week about 3 miles at a time.  Interval History: Alan [name] is doing quite well today. He denies any recurrence of any anginal chest pain at rest or exertion. Exertional dyspnea with only exercise he does a colonoscopy a month ago and it did well. He denies any PND, orthopnea or edema. Denies any lightheadedness, dizziness or wooziness. No syncope/near syncope no TIA or RCA symptoms. He also denies any melena, hematochezia or hematuria. No epistaxis or claudication.  Past Medical History  Diagnosis Date  . ST elevation myocardial infarction (STEMI) of anterior wall, subsequent episode of care 2004    he had a proximal LAD occlusion treated with 2 overlapping 3.5 x 1.8 mm Cypher DES stents.  Marland Kitchen CAD S/P percutaneous coronary angioplasty 2004    which showed essentially normal LV size and function, moderately dilated left atrium, moderate mitral prolapse with mild to moderate regurgitation.  Marland Kitchen History of stress test 12/2009    he walked 9 mins reaching 10 METS. There was an attenuation artifact in the inferior region but no ischemia or infaract, low risk.  . H/O echocardiogram 09/2011    which showed essentially normal LV size and function, moderately dilated left atrium, moderate mitral prolapse with mild to moderate regurgitation.  . Mitral valve prolapse     Moderate - with  moderate MR, noted February t 2013  . Moderate mitral regurgitation by prior echocardiogram   . Hypertension   . Dyslipidemia, goal LDL below 70   . GERD (gastroesophageal reflux disease)     Prior Cardiac Evaluation and Past Surgical History: Past Surgical History  Procedure Laterality Date  . Cardiac catheterization  2004    he had a proximal LAD occlusion treated with 2 overlapping 3.5 x 1.8 mm Cypher DES stents.  . Cardiac catheterization  2007    where he had widely patent LAD stents, 40% dostal LAD, 30% circumflex and 50-60% inferior bifurcation lesion in the PL with mild anteroapical hypokinesis.  Marland Kitchen Anterior cruciate ligament repair  1993  . [facility]m myoview ltd  May 2011    Walk 9 minutes, and 10 METs, diaphragmatic attenuation but no ischemia or infarction.  . Transthoracic echocardiogram  February 2013    Mild L. the dilation (likely normal for size) normal function greater than 55% EF. Moderate LA dilation. Moderate mitral prolapse of posterior leaflet with mild to moderate mitral regurgitation. -- No significant change since 2010    Allergies  Allergen Reactions  . Penicillins     Current Outpatient Prescriptions  Medication Sig Dispense Refill  . aspirin 81 MG tablet Take 81 mg by mouth. Mon, Wed and Fri      . clopidogrel (PLAVIX) 75 MG tablet TAKE 1 TABLET BY MOUTH EVERY DAY  30 tablet  0  . Coenzyme  Q10 (CO Q 10) 100 MG CAPS Take 1 capsule by mouth daily.      Marland Kitchen losartan (COZAAR) 25 MG tablet Take 1 tablet (25 mg total) by mouth daily.  30 tablet  11  . Multiple Vitamins-Minerals (MULTIVITAMINS THER. W/MINERALS) TABS Take 1 tablet by mouth daily.      . Probiotic Product (PROBIOTIC DAILY PO) Take 1 tablet by mouth daily.       No current facility-administered medications for this visit.    History   Social History Narrative   He is a married father of one. Exercises avidly as noted above - runs routinely at least 3 miles 3-4 times a day.  He drinks his Xango fruit  juce - 32 Oz. Daily.     Never smoked and only takes occasional alcohol    ROS: A comprehensive Review of Systems - Negative except Recovering from a recent injury a muscle pull in his shoulder. He is just now getting back to his full exercise.  PHYSICAL EXAM BP 110/62  Pulse 68  Ht 6\' 4"  (1.93 m)  Wt 194 lb (87.998 kg)  BMI 23.62 kg/m2 General appearance: alert, cooperative, no distress and Pleasant mood and affect. Very healthy-appearing Neck: no adenopathy, no carotid bruit, no JVD, supple, symmetrical, trachea midline and thyroid not enlarged, symmetric, no tenderness/mass/nodules Lungs: clear to auscultation bilaterally, normal percussion bilaterally and Nonlabored, good air movement Heart: RRR, normal S1-S2. There is a soft midsystolic click at 2/6 HSM this high pitched and blowing ; nondisplaced PMI. No other rubs or gallops. Abdomen: soft, non-tender; bowel sounds normal; no masses,  no organomegaly Extremities: extremities normal, atraumatic, no cyanosis or edema Pulses: 2+ and symmetric Neurologic: Alert and oriented X 3, normal strength and tone. Normal symmetric reflexes. Normal coordination and gait   Adult ECG Report  Rate: 68 ;  Rhythm: normal sinus rhythm Borderline LVH but otherwise normal axis and intervals.  Narrative Interpretation: Essentially normal EKG  Recent Labs: No labs available  ASSESSMENT / PLAN: ST elevation myocardial infarction (STEMI) of anterior wall, subsequent episode of care No adverse effects with normal EF. No recurrent angina or heart failure symptoms  CAD S/P percutaneous coronary angioplasty - DES PCI to occluded LAD during anterior STEMI Shealy active with no recurrent anginal symptoms. On aspirin plus Plavix as well as an ARB. None beta blocker due to bradycardia in the past. He also did not tolerate the beta blocker with his running  Mitral regurgitation  This was stable from 2010 to 2013. Due for another echocardiogram now. By exam,  the murmur does seem a bit louder and more prominent. I also did hear a mitral click this time the prolapse. Explained in the main reason to be adding the ACE inhibitor (which was then changed to an ARB due to cough) was in order to provide afterload reduction and minimize mitral regurgitation.  Dyslipidemia, goal LDL below 70 He is not on a statin, but is taking fluids and eating. His cholesterol labs have been followed by his PCP. Unfortunately anatomy results recently.  Hypertension Blood pressure is excellently controlled. He did not really need to be ARB for blood pressure standpoint, however he is starting well without problems.    Orders Placed This Encounter  Procedures  . EKG 12-Lead  . 2D Echocardiogram without contrast    Standing Status: Future     Number of Occurrences:      Standing Expiration Date: 11/27/2014    Scheduling Instructions:  DX MITRAL REGRUG AND MITRAL PROLAPSE    Order Specific Question:  Type of Echo    Answer:  Complete    Order Specific Question:  Where should this test be performed    Answer:  [facility]    Order Specific Question:  Reason for exam-Echo    Answer:  Mitral Valve Disorder  424.0     Followup: One year  Dresden Lozito W. Ellyn Hack, M.D., M.S. Interventional Cardiology [facility]

## 2013-11-29 NOTE — Assessment & Plan Note (Signed)
No adverse effects with normal EF. No recurrent angina or heart failure symptoms

## 2013-11-29 NOTE — Assessment & Plan Note (Addendum)
This was stable from 2010 to 2013. Due for another echocardiogram now. By exam, the murmur does seem a bit louder and more prominent. I also did hear a mitral click this time the prolapse. Explained in the main reason to be adding the ACE inhibitor (which was then changed to an ARB due to cough) was in order to provide afterload reduction and minimize mitral regurgitation.

## 2013-11-29 NOTE — Assessment & Plan Note (Signed)
Shealy active with no recurrent anginal symptoms. On aspirin plus Plavix as well as an ARB. None beta blocker due to bradycardia in the past. He also did not tolerate the beta blocker with his running

## 2013-11-29 NOTE — Assessment & Plan Note (Signed)
Blood pressure is excellently controlled. He did not really need to be ARB for blood pressure standpoint, however he is starting well without problems.

## 2013-12-12 ENCOUNTER — Ambulatory Visit (HOSPITAL_COMMUNITY): Payer: Federal, State, Local not specified - PPO

## 2013-12-18 ENCOUNTER — Other Ambulatory Visit: Payer: Self-pay | Admitting: Cardiology

## 2013-12-19 NOTE — Telephone Encounter (Signed)
Rx was sent to pharmacy electronically. 

## 2013-12-26 ENCOUNTER — Telehealth: Payer: Self-pay | Admitting: *Deleted

## 2013-12-26 ENCOUNTER — Ambulatory Visit (HOSPITAL_COMMUNITY)
Admission: RE | Admit: 2013-12-26 | Discharge: 2013-12-26 | Disposition: A | Discharge status: Home / Self Care | Payer: Federal, State, Local not specified - PPO | Source: Ambulatory Visit | Attending: Cardiology | Admitting: Cardiology

## 2013-12-26 DIAGNOSIS — I059 Rheumatic mitral valve disease, unspecified: Secondary | ICD-10-CM | POA: Insufficient documentation

## 2013-12-26 DIAGNOSIS — I341 Nonrheumatic mitral (valve) prolapse: Secondary | ICD-10-CM

## 2013-12-26 NOTE — Progress Notes (Signed)
Quick Note:  Echo Result:  *EF 60-65%; Grade 2 diastolic Dysfunction; Moderate Left Atrial dilation *Severe posterior leaflet prolapse with Severe Regurgitation (was moderate & moderate last check)   Tthe Mitral Valve looks more severe -- both the prolapse & the regurgitation. I was concerned by the physical exam findings.  At this point, we recommend having a TEE (Trans-esophageal Echo) done to better assess the valve. This is basically to see if it is time for surgical repair.   We should have him come back in to either see me or an APP - to discuss TEE & future thoughts.  Leonie Man, MD  ______

## 2013-12-26 NOTE — Telephone Encounter (Signed)
Spoke to wife. Result given . Verbalized understanding. Offered an appointment for 12/31/13. Wife states he will be out of town unitl next week.Next opening is in June 2015. Appointment schedule for 01/20/14 at 10 :30 am will notify schedule

## 2013-12-26 NOTE — Progress Notes (Signed)
2D Echocardiogram Complete.  12/26/2013   Husam Hohn, RDCS 

## 2013-12-26 NOTE — Telephone Encounter (Signed)
Left message on both phones,to call back discuss results

## 2013-12-26 NOTE — Telephone Encounter (Signed)
Message copied by Raiford Simmonds on Fri Dec 26, 2013  3:02 PM ------      Message from: Leonie Man      Created: Fri Dec 26, 2013  1:56 PM       Echo Result:       #EF 60-65%; Grade 2 diastolic Dysfunction; Moderate Left Atrial dilation      #Severe posterior leaflet prolapse with Severe Regurgitation (was moderate & moderate last check)                  Tthe Mitral Valve looks more severe -- both the prolapse & the regurgitation.  I was concerned by the physical exam findings.            At this point, we recommend having a TEE (Trans-esophageal Echo) done to better assess the valve.  This is basically to see if it is time for surgical repair.             We should have him come back in to either see me or an APP - to discuss TEE & future thoughts.            Leonie Man, MD       ------

## 2014-01-27 ENCOUNTER — Encounter: Payer: Self-pay | Admitting: Cardiology

## 2014-01-28 ENCOUNTER — Ambulatory Visit (INDEPENDENT_AMBULATORY_CARE_PROVIDER_SITE_OTHER): Payer: Federal, State, Local not specified - PPO | Admitting: Cardiology

## 2014-01-28 ENCOUNTER — Encounter: Payer: Self-pay | Admitting: Internal Medicine

## 2014-01-28 VITALS — BP 138/72 | HR 72 | Ht 76.0 in | Wt 194.9 lb

## 2014-01-28 DIAGNOSIS — D689 Coagulation defect, unspecified: Secondary | ICD-10-CM

## 2014-01-28 DIAGNOSIS — I059 Rheumatic mitral valve disease, unspecified: Secondary | ICD-10-CM

## 2014-01-28 DIAGNOSIS — Z9861 Coronary angioplasty status: Secondary | ICD-10-CM

## 2014-01-28 DIAGNOSIS — I10 Essential (primary) hypertension: Secondary | ICD-10-CM

## 2014-01-28 DIAGNOSIS — Z01818 Encounter for other preprocedural examination: Secondary | ICD-10-CM

## 2014-01-28 DIAGNOSIS — I2510 Atherosclerotic heart disease of native coronary artery without angina pectoris: Secondary | ICD-10-CM

## 2014-01-28 DIAGNOSIS — I341 Nonrheumatic mitral (valve) prolapse: Secondary | ICD-10-CM

## 2014-01-28 DIAGNOSIS — I340 Nonrheumatic mitral (valve) insufficiency: Secondary | ICD-10-CM

## 2014-01-28 DIAGNOSIS — E785 Hyperlipidemia, unspecified: Secondary | ICD-10-CM

## 2014-01-28 NOTE — Patient Instructions (Signed)
Your physician has requested that you have a TEE. During a TEE, sound waves are used to create images of your heart. It provides your doctor with information about the size and shape of your heart and how well your heart's chambers and valves are working. In this test, a transducer is attached to the end of a flexible tube that's guided down your throat and into your esophagus (the tube leading from you mouth to your stomach) to get a more detailed image of your heart. You are not awake for the procedure. Please see the instruction sheet given to you today. For further information please visit [email or web address].  PLEASE DO LAB- BMP ,CBC, TSH , PT ,PTT Your physician recommends that you schedule a follow-up appointment AFTER TEE TO DISCUSS RESULTS

## 2014-01-30 ENCOUNTER — Encounter: Payer: Self-pay | Admitting: Cardiology

## 2014-01-30 DIAGNOSIS — Z01818 Encounter for other preprocedural examination: Secondary | ICD-10-CM | POA: Insufficient documentation

## 2014-01-30 LAB — CBC
HCT: 41.3 % (ref 39.0–52.0)
HEMOGLOBIN: 14.4 g/dL (ref 13.0–17.0)
MCH: 31.3 pg (ref 26.0–34.0)
MCHC: 34.9 g/dL (ref 30.0–36.0)
MCV: 89.8 fL (ref 78.0–100.0)
Platelets: 196 10*3/uL (ref 150–400)
RBC: 4.6 MIL/uL (ref 4.22–5.81)
RDW: 13.3 % (ref 11.5–15.5)
WBC: 4.9 10*3/uL (ref 4.0–10.5)

## 2014-01-30 NOTE — Assessment & Plan Note (Signed)
We'll check basic labs for preprocedure testing.

## 2014-01-30 NOTE — Assessment & Plan Note (Signed)
No active anginal symptoms. Remains on aspirin Plavix as well as ARB. Beta blocker because he cannot tolerate them as a runner. His resting heart rate is usually in the 60s and 70s.

## 2014-01-30 NOTE — Assessment & Plan Note (Signed)
Well-controlled with ARB. Continue current regimen

## 2014-01-30 NOTE — Assessment & Plan Note (Signed)
This actually shows a progression of disease since 2013. I did note before the murmur and click sounded louder on exam. This makes sense now with the new findings on echo. Thankfully his EF is still stable and LV chamber size is not dilated.  Plan: Per echocardiogram report, we'll order a TEE for better mitral valve evaluation

## 2014-01-30 NOTE — Assessment & Plan Note (Signed)
See above. Click is definitely prominent on exam.

## 2014-01-30 NOTE — Assessment & Plan Note (Signed)
Labs usually followed by PCP. He says that his hasn't been well-controlled. He does not take a statin because of severe myalgias in the past.

## 2014-01-30 NOTE — Progress Notes (Signed)
PCP: Donnie Coffin, MD  Clinic Note: Chief Complaint  Patient presents with  . Follow-up    result echo , no chest pain , no sob, no edema    HPI: Alan [name] is a 58 y.o. male with a Cardiovascular Problem List below who presents today for followup of the transthoracic echocardiogram for mitral prolapse and regurgitation. Echocardiogram on May 15 which revealed normal EF of 60% with severe posterior mitral valve leaflet prolapse with severe mitral regurgitation. Recommended TEE..  Interval History: He comes in today really feeling too much different overall. He has noted maybe a slight drop in his exercise tolerance, but is hard to tell because his also not to do as much for other reasons.  Otherwise denies any chest tightness or pressure with rest or exertion. No exertional dyspnea unless he is really pushing it. He is an avid runner. Denies any PND, orthopnea or edema. No rapid or irregular heartbeat/palpitations. No TIA/amaurosis fugax, syncope/near-syncope symptoms. No melena, hematochezia hematuria, epistaxis or claudication.  Past Medical History  Diagnosis Date  . ST elevation myocardial infarction (STEMI) of anterior wall, subsequent episode of care 2004    he had a proximal LAD occlusion treated with 2 overlapping 3.5 x 1.8 mm Cypher DES stents.  Marland Kitchen CAD S/P percutaneous coronary angioplasty 2004    which showed essentially normal LV size and function, moderately dilated left atrium, moderate mitral prolapse with mild to moderate regurgitation.  Marland Kitchen History of stress test 12/2009    he walked 9 mins reaching 10 METS. There was an attenuation artifact in the inferior region but no ischemia or infaract, low risk.  . H/O echocardiogram 09/2011    which showed essentially normal LV size and function, moderately dilated left atrium, moderate mitral prolapse with mild to moderate regurgitation.  . Mitral valve prolapse     Moderate - with moderate MR, noted February t 2013  .  Moderate mitral regurgitation by prior echocardiogram   . Hypertension   . Dyslipidemia, goal LDL below 70   . GERD (gastroesophageal reflux disease)     Prior Cardiac Evaluation and Past Surgical History: Past Surgical History  Procedure Laterality Date  . Cardiac catheterization  2004    he had a proximal LAD occlusion treated with 2 overlapping 3.5 x 1.8 mm Cypher DES stents.  . Cardiac catheterization  2007    where he had widely patent LAD stents, 40% dostal LAD, 30% circumflex and 50-60% inferior bifurcation lesion in the PL with mild anteroapical hypokinesis.  Marland Kitchen Anterior cruciate ligament repair  1993  . Nm myoview ltd  May 2011    Walk 9 minutes, and 10 METs, diaphragmatic attenuation but no ischemia or infarction.  . Transthoracic echocardiogram  February 2013    Mild L. the dilation (likely normal for size) normal function greater than 55% EF. Moderate LA dilation. Moderate mitral prolapse of posterior leaflet with mild to moderate mitral regurgitation. -- No significant change since 2010    MEDICATIONS AND ALLERGIES REVIEWED IN EPIC No Change in Social and Family History  ROS: A comprehensive Review of Systems - Negative except Mild joint aches and pains and is slightly decreased exercise tolerance  PHYSICAL EXAM BP 138/72  Pulse 72  Ht 6\' 4"  (1.93 m)  Wt 194 lb 14.4 oz (88.406 kg)  BMI 23.73 kg/m2 General appearance: alert, cooperative, no distress and Pleasant mood and affect. Very healthy-appearing  Neck: no adenopathy, no carotid bruit, no JVD, supple, symmetrical, trachea midline and thyroid not  enlarged, symmetric, no tenderness/mass/nodules  Lungs: clear to auscultation bilaterally, normal percussion bilaterally and Nonlabored, good air movement  Heart: RRR, normal S1-S2. There is a soft midsystolic click with 3/6 HSM this high pitched and blowing ; nondisplaced PMI. No other rubs or gallops.  Abdomen: soft, non-tender; bowel sounds normal; no masses, no  organomegaly  Extremities: extremities normal, atraumatic, no cyanosis or edema  Pulses: 2+ and symmetric  Neurologic: Alert and oriented X 3, normal strength and tone. Normal symmetric reflexes. Normal coordination and gait   Adult ECG Report - not performed  Recent Labs: None  ASSESSMENT / PLAN: Severe mitral regurgitation This actually shows a progression of disease since 2013. I did note before the murmur and click sounded louder on exam. This makes sense now with the new findings on echo. Thankfully his EF is still stable and LV chamber size is not dilated.  Plan: Per echocardiogram report, we'll order a TEE for better mitral valve evaluation  Mitral valve prolapse - severe prolapse of the posterior leaflet with severe MR See above. Click is definitely prominent on exam.  CAD S/P percutaneous coronary angioplasty - DES PCI to occluded LAD during anterior STEMI No active anginal symptoms. Remains on aspirin Plavix as well as ARB. Beta blocker because he cannot tolerate them as a runner. His resting heart rate is usually in the 60s and 70s.  Dyslipidemia, goal LDL below 70 Labs usually followed by PCP. He says that his hasn't been well-controlled. He does not take a statin because of severe myalgias in the past.  Hypertension Well-controlled with ARB. Continue current regimen  Pre-op testing We'll check basic labs for preprocedure testing.      Orders Placed This Encounter  Procedures  . APTT  . Basic metabolic panel  . CBC  . Protime-INR  . TSH  . Echocardiogram transesophageal    Standing Status: Future     Number of Occurrences:      Standing Expiration Date: 01/29/2015    Scheduling Instructions:     SCHEDULE WITH EITHER DR HILTY OR DR [facility]    Order Specific Question:  Type of Echo    Answer:  Complete    Order Specific Question:  Reason for exam-Echo    Answer:  Mitral Valve Disorder  424.0   No orders of the defined types were placed in this encounter.     Followup: 1-2 months - after TEE  DAVID W. Ellyn Hack, M.D., M.S. Interventional Cardiologist [facility]

## 2014-01-31 LAB — BASIC METABOLIC PANEL
BUN: 17 mg/dL (ref 6–23)
CHLORIDE: 105 meq/L (ref 96–112)
CO2: 25 mEq/L (ref 19–32)
CREATININE: 0.78 mg/dL (ref 0.50–1.35)
Calcium: 9.8 mg/dL (ref 8.4–10.5)
Glucose, Bld: 92 mg/dL (ref 70–99)
POTASSIUM: 4.1 meq/L (ref 3.5–5.3)
SODIUM: 140 meq/L (ref 135–145)

## 2014-01-31 LAB — TSH: TSH: 1.442 u[IU]/mL (ref 0.350–4.500)

## 2014-01-31 LAB — APTT: APTT: 32 s (ref 24–37)

## 2014-01-31 LAB — PROTIME-INR
INR: 0.93 (ref <1.50)
PROTHROMBIN TIME: 12.4 s (ref 11.6–15.2)

## 2014-02-03 ENCOUNTER — Telehealth: Payer: Self-pay | Admitting: *Deleted

## 2014-02-03 NOTE — Telephone Encounter (Signed)
Spoke to patient. Result given . Verbalized understanding  

## 2014-02-03 NOTE — Telephone Encounter (Signed)
Message copied by Raiford Simmonds on Tue Feb 03, 2014  3:22 PM ------      Message from: Leonie Man      Created: Sun Feb 01, 2014  2:32 PM       Labs are fine for TEE.            Leonie Man, MD       ------

## 2014-02-04 ENCOUNTER — Other Ambulatory Visit: Payer: Self-pay | Admitting: *Deleted

## 2014-02-04 DIAGNOSIS — Z01818 Encounter for other preprocedural examination: Secondary | ICD-10-CM

## 2014-02-06 ENCOUNTER — Ambulatory Visit (HOSPITAL_COMMUNITY)
Admission: RE | Admit: 2014-02-06 | Discharge: 2014-02-06 | Disposition: A | Discharge status: Home / Self Care | Payer: Federal, State, Local not specified - PPO | Source: Ambulatory Visit | Attending: Internal Medicine | Admitting: Internal Medicine

## 2014-02-06 ENCOUNTER — Encounter (HOSPITAL_COMMUNITY): Payer: Self-pay | Admitting: Internal Medicine

## 2014-02-06 ENCOUNTER — Telehealth: Payer: Self-pay | Admitting: Cardiology

## 2014-02-06 ENCOUNTER — Encounter (HOSPITAL_COMMUNITY)
Admission: RE | Disposition: A | Discharge status: Home / Self Care | Payer: Self-pay | Source: Ambulatory Visit | Attending: Internal Medicine

## 2014-02-06 DIAGNOSIS — I340 Nonrheumatic mitral (valve) insufficiency: Secondary | ICD-10-CM

## 2014-02-06 DIAGNOSIS — Z01818 Encounter for other preprocedural examination: Secondary | ICD-10-CM

## 2014-02-06 DIAGNOSIS — I341 Nonrheumatic mitral (valve) prolapse: Secondary | ICD-10-CM

## 2014-02-06 DIAGNOSIS — I059 Rheumatic mitral valve disease, unspecified: Secondary | ICD-10-CM

## 2014-02-06 HISTORY — PX: TEE WITHOUT CARDIOVERSION: SHX5443

## 2014-02-06 HISTORY — DX: Nonrheumatic mitral (valve) insufficiency: I34.0

## 2014-02-06 SURGERY — ECHOCARDIOGRAM, TRANSESOPHAGEAL
Anesthesia: Moderate Sedation

## 2014-02-06 MED ORDER — LIDOCAINE VISCOUS 2 % MT SOLN
OROMUCOSAL | Status: AC
  Filled 2014-02-06: qty 15

## 2014-02-06 MED ORDER — MIDAZOLAM HCL 10 MG/2ML IJ SOLN
INTRAMUSCULAR | Status: DC | PRN
  Administered 2014-02-06: 1 mg via INTRAVENOUS
  Administered 2014-02-06: 2 mg via INTRAVENOUS
  Administered 2014-02-06 (×2): 1 mg via INTRAVENOUS

## 2014-02-06 MED ORDER — MIDAZOLAM HCL 5 MG/ML IJ SOLN
INTRAMUSCULAR | Status: AC
  Filled 2014-02-06: qty 2

## 2014-02-06 MED ORDER — FENTANYL CITRATE 0.05 MG/ML IJ SOLN
INTRAMUSCULAR | Status: DC | PRN
Start: 2014-02-06 — End: 2014-02-06
  Administered 2014-02-06 (×2): 25 ug via INTRAVENOUS

## 2014-02-06 MED ORDER — SODIUM CHLORIDE 0.9 % IV SOLN: INTRAVENOUS | Status: DC

## 2014-02-06 MED ORDER — LIDOCAINE VISCOUS 2 % MT SOLN
OROMUCOSAL | Status: DC | PRN
  Administered 2014-02-06: 10 mL via OROMUCOSAL

## 2014-02-06 MED ORDER — BUTAMBEN-TETRACAINE-BENZOCAINE 2-2-14 % EX AERO
INHALATION_SPRAY | CUTANEOUS | Status: DC | PRN
  Administered 2014-02-06: 2 via TOPICAL

## 2014-02-06 MED ORDER — FENTANYL CITRATE 0.05 MG/ML IJ SOLN
INTRAMUSCULAR | Status: AC
  Filled 2014-02-06: qty 2

## 2014-02-06 NOTE — Telephone Encounter (Signed)
Alan [name] is calling and they are wanting to know what is the next step that is needed to take . He had a TEE on this morning. Please call  Thanks

## 2014-02-06 NOTE — Discharge Instructions (Signed)
Transesophageal Echocardiogram °Transesophageal echocardiography (TEE) is a special type of test that produces images of the heart by using sound waves (echocardiogram). This type of echocardiography can obtain better images of the heart than standard echocardiography. TEE is done by passing a flexible tube down the esophagus. The heart is located in front of the esophagus. Because the heart and esophagus are close to one another, your health care provider can take very clear, detailed pictures of the heart via ultrasound waves. °TEE may be done: °· If your health care provider needs more information based on standard echocardiography findings. °· If you had a stroke. This might have happened because a clot formed in your heart. TEE can visualize different areas of the heart and check for clots. °· To check valve anatomy and function. °· To check for infection on the inside of your heart (endocarditis). °· To evaluate the dividing wall (septum) of the heart and presence of a hole that did not close after birth (patent foramen ovale or atrial septal defect). °· To help diagnose a tear in the wall of the aorta (aortic dissection). °· During cardiac valve surgery. This allows the surgeon to assess the valve repair before closing the chest. °· During a variety of other cardiac procedures to guide positioning of catheters. °· Sometimes before a cardioversion, which is a shock to convert heart rhythm back to normal. °LET YOUR HEALTH CARE PROVIDER KNOW ABOUT:  °· Any allergies you have. °· All medicines you are taking, including vitamins, herbs, eye drops, creams, and over-the-counter medicines. °· Previous problems you or members of your family have had with the use of anesthetics. °· Any blood disorders you have. °· Previous surgeries you have had. °· Medical conditions you have. °· Swallowing difficulties. °· An esophageal obstruction. °RISKS AND COMPLICATIONS  °Generally, TEE is a safe procedure. However, as with any  procedure, complications can occur. Possible complications include an esophageal tear (rupture). °BEFORE THE PROCEDURE  °· Do not eat or drink for 6 hours before the procedure or as directed by your health care provider. °· Arrange for someone to drive you home after the procedure. Do not drive yourself home. During the procedure, you will be given medicines that can continue to make you feel drowsy and can impair your reflexes. °· An IV access tube will be started in the arm. °PROCEDURE  °· A medicine to help you relax (sedative) will be given through the IV access tube. °· A medicine may be sprayed or gargled to numb the back of the throat. °· Your blood pressure, heart rate, and breathing (vital signs) will be monitored during the procedure. °· The TEE probe is a long, flexible tube. The tip of the probe is placed into the back of the mouth, and you will be asked to swallow. This helps to pass the tip of the probe into the esophagus. Once the tip of the probe is in the correct area, your health care provider can take pictures of the heart. °· TEE is usually not a painful procedure. You may feel the probe press against the back of the throat. The probe does not enter the trachea and does not affect your breathing. °AFTER THE PROCEDURE  °· You will be in bed, resting, until you have fully returned to consciousness. °· When you first awaken, your throat may feel slightly sore and will probably still feel numb. This will improve slowly over time. °· You will not be allowed to eat or drink until it   is clear that the numbness has improved. °· Once you have been able to drink, urinate, and sit on the edge of the bed without feeling sick to your stomach (nausea) or dizzy, you may be cleared to go home. °· You should have a friend or family member with you for the next 24 hours after your procedure. °Document Released: 10/21/2002 Document Revised: 08/05/2013 Document Reviewed: 01/30/2013 °[employer]® Patient Information  ©2015 [employer]. This information is not intended to replace advice given to you by your health care provider. Make sure you discuss any questions you have with your health care provider. ° °

## 2014-02-06 NOTE — Telephone Encounter (Signed)
Informed Mrs Conger , will call when,  appointment is made available

## 2014-02-06 NOTE — Telephone Encounter (Signed)
I have not heard the results -- have been in the cath lab all day.  Will discuss with performing MD.  They should have given a hint of this plan.    Leonie Man, MD

## 2014-02-06 NOTE — H&P (Signed)
     INTERVAL PROCEDURE H&P  History and Physical Interval Note:  02/06/2014 9:34 AM  Alan [name] has presented today for their planned procedure. The various methods of treatment have been discussed with the patient and family. After consideration of risks, benefits and other options for treatment, the patient has consented to the procedure.  The patients' outpatient history has been reviewed, patient examined, and no change in status from most recent office note within the past 30 days. I have reviewed the patients' chart and labs and will proceed as planned. Questions were answered to the patient's satisfaction.   Alan Casino, MD, [facility] Attending Cardiologist [facility]  Alan [name],Alan [name] 02/06/2014, 9:34 AM

## 2014-02-06 NOTE — Telephone Encounter (Signed)
Will refer to Dr. Roxy Manns for Surgical Evaluation.  Will probably need to do a R&LHC.  Will make the referral on Monday. They should hear from [place] once we get a hold of Dr. Roxy Manns.  I was too busy today to check results.  Leonie Man, MD

## 2014-02-06 NOTE — CV Procedure (Signed)
TRANSESOPHAGEAL ECHOCARDIOGRAM (TEE) NOTE  INDICATIONS: Mitral valve prolapse, severe MR  PROCEDURE:   Informed consent was obtained prior to the procedure. The risks, benefits and alternatives for the procedure were discussed and the patient comprehended these risks.  Risks include, but are not limited to, cough, sore throat, vomiting, nausea, somnolence, esophageal and stomach trauma or perforation, bleeding, low blood pressure, aspiration, pneumonia, infection, trauma to the teeth and death.    After a procedural time-out, the patient was given 7 mg versed and 75 mcg fentanyl for moderate sedation.  The oropharynx was anesthetized 10 cc of topical 1% viscous lidocaine and 1 cetacaine spray.  The transesophageal probe was inserted in the esophagus and stomach without difficulty and multiple views were obtained.  The patient was kept under observation until the patient left the procedure room.  The patient left the procedure room in stable condition.   Agitated microbubble saline contrast was not administered.  COMPLICATIONS:    There were no immediate complications.  Findings:  1. LEFT VENTRICLE: The left ventricular wall thickness is top normal.  The left ventricular cavity is normal in size. Wall motion is normal.  LVEF is 55-60%.  2. RIGHT VENTRICLE:  The right ventricle is normal in structure and function without any thrombus or masses.    3. LEFT ATRIUM:  The left atrium is dilated in size without any thrombus or masses.  There is not spontaneous echo contrast ("smoke") in the left atrium consistent with a low flow state.  4. LEFT ATRIAL APPENDAGE:  The left atrial appendage is free of any thrombus or masses. The appendage has single lobes. Prominent trabeculation is noted. Pulse doppler indicates moderate flow in the appendage.  5. ATRIAL SEPTUM:  The atrial septum appears intact and is free of thrombus and/or masses.  There is no evidence for interatrial shunting by color  doppler.  6. RIGHT ATRIUM:  The right atrium is normal in size and function without any thrombus or masses.  7. MITRAL VALVE:  The mitral valve demonstrates severe prolapse of the P2 segment of the posterior leaflet, with a prolapsing cord and Severe, anteriorly directed eccentric regurgitation. Due to the eccentricity of the jet, PISA calculations could not be obtained, however, flow reversal in the pulmonary veins was noted. The valve was well visualized in 2D and 3D surgeon's views. The anterior leaflet appears to be intact and normally functioning. There were no vegetations or stenosis.  8. AORTIC VALVE:  The aortic valve is normal in structure and function with trivial regurgitation.  There were no vegetations or stenosis  9. TRICUSPID VALVE:  The tricuspid valve is normal in structure and function with trivial to mild regurgitation.  There were no vegetations or stenosis  10.  PULMONIC VALVE:  The pulmonic valve is normal in structure and function with trivial regurgitation.  There were no vegetations or stenosis.   11. AORTIC ARCH, ASCENDING AND DESCENDING AORTA:  There was no atherosclerosis of the ascending aorta, aortic arch, or proximal descending aorta.  12. PULMONARY VEINS: Anomalous pulmonary venous return was not noted.  Reversal of flow in the pulmonary vein was noted, suggesting severe mitral regurgitation.  13. PERICARDIUM: The pericardium appeared normal and non-thickened.  There is a no pericardial effusion.  IMPRESSION:   1. Severe, anteriorly directed mitral regurgitation, secondary to a flail P2 segment of the posterior mitral leaflet. There is at least one ruptured cord that is seen prolapsing into the left atrium.  The valve leaflet itself  does not appear thickened or myxomatous. The anterior mitral leaflet appears normal.  2. Moderate left atrial enlargement. 3. Normal LV systolic function, EF 63-78%. 4. No LAA thrombus  RECOMMENDATIONS:    1.  Surgical evaluation  for mitral valve repair is recommended.  Time Spent Directly with the Patient:  60 minutes   Pixie Casino, MD, [facility] Attending Cardiologist [facility]  02/06/2014, 11:41 AM

## 2014-02-06 NOTE — Progress Notes (Signed)
  Echocardiogram Echocardiogram Transesophageal has been performed.  Alan [name] 02/06/2014, 11:25 AM

## 2014-02-09 ENCOUNTER — Encounter (HOSPITAL_COMMUNITY): Payer: Self-pay | Admitting: Internal Medicine

## 2014-02-09 NOTE — Telephone Encounter (Signed)
RN spoke to patient.appointment.  Schedule for February 18 2014 2:30 pm to discuss TEE. PATIENT VERBALIZED UNDERSTANDING.

## 2014-02-18 ENCOUNTER — Encounter: Payer: Self-pay | Admitting: Cardiology

## 2014-02-18 ENCOUNTER — Ambulatory Visit (INDEPENDENT_AMBULATORY_CARE_PROVIDER_SITE_OTHER): Payer: Federal, State, Local not specified - PPO | Admitting: Cardiology

## 2014-02-18 VITALS — BP 126/70 | HR 74 | Ht 76.0 in | Wt 197.1 lb

## 2014-02-18 DIAGNOSIS — I10 Essential (primary) hypertension: Secondary | ICD-10-CM

## 2014-02-18 DIAGNOSIS — I2510 Atherosclerotic heart disease of native coronary artery without angina pectoris: Secondary | ICD-10-CM

## 2014-02-18 DIAGNOSIS — Z01818 Encounter for other preprocedural examination: Secondary | ICD-10-CM

## 2014-02-18 DIAGNOSIS — I341 Nonrheumatic mitral (valve) prolapse: Secondary | ICD-10-CM

## 2014-02-18 DIAGNOSIS — E785 Hyperlipidemia, unspecified: Secondary | ICD-10-CM

## 2014-02-18 DIAGNOSIS — Z9861 Coronary angioplasty status: Secondary | ICD-10-CM

## 2014-02-18 DIAGNOSIS — I340 Nonrheumatic mitral (valve) insufficiency: Secondary | ICD-10-CM

## 2014-02-18 DIAGNOSIS — I059 Rheumatic mitral valve disease, unspecified: Secondary | ICD-10-CM

## 2014-02-18 NOTE — Patient Instructions (Signed)
Your physician recommends that you schedule a follow-up appointment  with DR Halford Chessman for surgery   Your physician wants you to follow-up after surgery- @ 2 months DR Ellyn Hack.   You will receive a reminder letter in the mail two months in advance. If you don't receive a letter, please call our office to schedule the follow-up appointment.

## 2014-02-19 ENCOUNTER — Encounter: Payer: Self-pay | Admitting: Cardiology

## 2014-02-19 NOTE — Assessment & Plan Note (Signed)
Anticipate need for Pre-op R&LHC. Has had labs for TEE - if able to schedule within 30 days, can avoid rechecking labs.

## 2014-02-19 NOTE — Assessment & Plan Note (Addendum)
Significant progression of disease to "Surgical" grade severity. CVTS Referral - Dr. Roxy Manns for MVR.  Remain ready to schedule R&LHC pre-op if requested.  Continue ARB for afterload reduction - not much BP room for additional RX.

## 2014-02-19 NOTE — Progress Notes (Signed)
PCP: Donnie Coffin, MD  Clinic Note: Chief Complaint  Patient presents with  . Follow-up    results of TEE    HPI: Alan [name] is a 58 y.o. male with a Cardiovascular Problem List below who presents today for followup of the trans-esophageal echocardiogram for mitral prolapse and regurgitation. 2D Transthroacic Echocardiogram on May 15 which revealed normal EF of 60% with severe posterior mitral valve leaflet prolapse with severe mitral regurgitation. Recommended TEE during 6/19 clinic visit. TEE 6/26: Severe mitral regurgitation with a flail P2 segment and ruptured chord. Recommendation: MVR.  Interval History: Today he returns to discuss the TEE findings and confirm the plan. He continues to deny any significant symptoms, besides a slight decline in exercise tolerance.  Otherwise denies any chest tightness or pressure with rest or exertion. No exertional dyspnea unless he is really pushing it. He is an avid runner. Denies any PND, orthopnea or edema. No rapid or irregular heartbeat/palpitations. No TIA/amaurosis fugax, syncope/near-syncope symptoms. No melena, hematochezia hematuria, epistaxis or claudication.  Past Medical History  Diagnosis Date  . ST elevation myocardial infarction (STEMI) of anterior wall, subsequent episode of care 2004    he had a proximal LAD occlusion treated with 2 overlapping 3.5 x 1.8 mm Cypher DES stents.  Marland Kitchen CAD S/P percutaneous coronary angioplasty 2004    which showed essentially normal LV size and function, moderately dilated left atrium, moderate mitral prolapse with mild to moderate regurgitation.  Marland Kitchen History of stress test 12/2009    he walked 9 mins reaching 10 METS. There was an attenuation artifact in the inferior region but no ischemia or infaract, low risk.  . H/O echocardiogram 09/2011    which showed essentially normal LV size and function, moderately dilated left atrium, moderate mitral prolapse with mild to moderate regurgitation.  .  Mitral valve prolapse     Moderate - with moderate MR, noted February t 2013  . Severe mitral regurgitation by prior echocardiogram 02/06/2014    TEE: Severe mitral regurgitation with a flail P2 segment and ruptured; Normal LV size & function, dilated LA.  Marland Kitchen Hypertension   . Dyslipidemia, goal LDL below 70   . GERD (gastroesophageal reflux disease)     Prior Cardiac Evaluation and Past Surgical History: Past Surgical History  Procedure Laterality Date  . Cardiac catheterization  2004    he had a proximal LAD occlusion treated with 2 overlapping 3.5 x 1.8 mm Cypher DES stents.  . Cardiac catheterization  2007    where he had widely patent LAD stents, 40% dostal LAD, 30% circumflex and 50-60% inferior bifurcation lesion in the PL with mild anteroapical hypokinesis.  Marland Kitchen Anterior cruciate ligament repair  1993  . Nm myoview ltd  May 2011    Walk 9 minutes, and 10 METs, diaphragmatic attenuation but no ischemia or infarction.  . Transthoracic echocardiogram  February 2013    Mild L. the dilation (likely normal for size) normal function greater than 55% EF. Moderate LA dilation. Moderate mitral prolapse of posterior leaflet with mild to moderate mitral regurgitation. -- No significant change since 2010  . Tee without cardioversion N/A 02/06/2014    Procedure: TRANSESOPHAGEAL ECHOCARDIOGRAM (TEE);  Surgeon: Pixie Casino, MD;  Normal LV Size U& function - EF 55-60%, no regional WMA.  MV P2 Leaflet is flail with ruptured chord with severe prolapse, anterior leaflet intact.  Severe, eccentric anterior directed MR with dilated LA.    MEDICATIONS AND ALLERGIES REVIEWED IN EPIC No Change in Social  and Family History  ROS: A comprehensive Review of Systems - Negative except Mild joint aches and pains and is slightly decreased exercise tolerance  PHYSICAL EXAM BP 126/70  Pulse 74  Ht 6\' 4"  (1.93 m)  Wt 197 lb 1.6 oz (89.404 kg)  BMI 24.00 kg/m2 General appearance: alert, cooperative, no  distress and Pleasant mood and affect. Very healthy-appearing  Neck: no adenopathy, no carotid bruit, no JVD, supple, symmetrical, trachea midline and thyroid not enlarged, symmetric, no tenderness/mass/nodules  Lungs: clear to auscultation bilaterally, normal percussion bilaterally and Nonlabored, good air movement  Heart: RRR, normal S1-S2. There is a soft midsystolic click with 2-7/7 HSM this high pitched and blowing ; nondisplaced PMI. No other rubs or gallops.  Abdomen: soft, non-tender; bowel sounds normal; no masses, no organomegaly  Extremities: extremities normal, atraumatic, no cyanosis or edema  Pulses: 2+ and symmetric  Neurologic: Alert and oriented X 3, normal strength and tone. Normal symmetric reflexes. Normal coordination and gait   Adult ECG Report - not performed  Recent Labs: None  ASSESSMENT / PLAN: Severre Mitral valve prolapse - severe prolapse of the posterior (P2) leaflet with severe MR Due to worsening clinical exam finding, was referred for re-evaluation of MVP/MR with echo done in June that confirmed progression of disease to Severe MR/MVP of Posterior Leaflet --> subsequently confirmed by TEE with Severe mitral regurgitation with a flail P2 segment and ruptured cord.  Thankfully EF remains relatively preserved by ~55-60%.  No SSx of CHF. Plan: Referral to Dr. Darylene Price of CT Surgery for consideration of MVR given the severity of MR & flail P2.  With h/o CAD, would anticipate potential need for pre-op R&LHC.  Will await initial consultation with Dr. Roxy Manns to confirm plans & will remain ready to schedule R&LHC if requested.  He denies angina Sx, but has not had an ischemic evaluation since 2011.   Severe mitral regurgitation Significant progression of disease to "Surgical" grade severity. CVTS Referral - Dr. Roxy Manns for MVR.  Remain ready to schedule R&LHC pre-op if requested.  Continue ARB for afterload reduction - not much BP room for additional RX.   CAD S/P  percutaneous coronary angioplasty - DES PCI to occluded LAD during anterior STEMI Remains asymptomatic.   No BB due to intolerance related to negative chronotropic effects.  On DAPT - would need to hold pero-operatively.  Not on statin - also mild intolerance due to mylagias & pt preference to avoid. Anticipate need for pre-op R&LHC.   Essential hypertension Well controlled on low dose ARB.  Dyslipidemia, goal LDL below 70 Monitored by PCP.  Reports adequate control.  No Statin due to h/o myalgias.  Pre-op testing Anticipate need for Pre-op R&LHC. Has had labs for TEE - if able to schedule within 30 days, can avoid rechecking labs.   I spent > 50 % of the interview discussing TEE findings & recommendations for referral to CVTS for MVR, likely need for R&LHC. Multiple questions addressed including natural progression of disease without surgery, explanation of the surgery, recovery time, pre-op testing, post-op follow-up, potential complications.  Most of the discussion was for the benefit of his wife, who was not present during his pre-TEE visit.  Orders Placed This Encounter  Procedures  . Ambulatory referral to Cardiothoracic Surgery    Referral Priority:  Routine    Referral Type:  Surgical    Referral Reason:  Specialty Services Required    Referred to Provider:  Rexene Alberts, MD  Requested Specialty:  Cardiothoracic Surgery    Number of Visits Requested:  1   No orders of the defined types were placed in this encounter.     Followup: 1-2 months - post-op AVR  Lunna Vogelgesang W. Ellyn Hack, M.D., M.S. Interventional Cardiologist [facility]

## 2014-02-19 NOTE — Assessment & Plan Note (Signed)
Well controlled on low dose ARB.

## 2014-02-19 NOTE — Assessment & Plan Note (Signed)
Remains asymptomatic.   No BB due to intolerance related to negative chronotropic effects.  On DAPT - would need to hold pero-operatively.  Not on statin - also mild intolerance due to mylagias & pt preference to avoid. Anticipate need for pre-op R&LHC.

## 2014-02-19 NOTE — Assessment & Plan Note (Signed)
Monitored by PCP.  Reports adequate control.  No Statin due to h/o myalgias.

## 2014-02-19 NOTE — Assessment & Plan Note (Signed)
Due to worsening clinical exam finding, was referred for re-evaluation of MVP/MR with echo done in June that confirmed progression of disease to Severe MR/MVP of Posterior Leaflet --> subsequently confirmed by TEE with Severe mitral regurgitation with a flail P2 segment and ruptured cord.  Thankfully EF remains relatively preserved by ~55-60%.  No SSx of CHF. Plan: Referral to Dr. Darylene Price of CT Surgery for consideration of MVR given the severity of MR & flail P2.  With h/o CAD, would anticipate potential need for pre-op R&LHC.  Will await initial consultation with Dr. Roxy Manns to confirm plans & will remain ready to schedule R&LHC if requested.  He denies angina Sx, but has not had an ischemic evaluation since 2011.

## 2014-02-20 ENCOUNTER — Telehealth: Payer: Self-pay | Admitting: *Deleted

## 2014-02-20 NOTE — Telephone Encounter (Signed)
Message copied by Raiford Simmonds on Fri Feb 20, 2014 10:18 AM ------      Message from: Drue Second A      Created: Fri Feb 20, 2014  8:44 AM       APPOINTMENT SET UP FOR  7/21 WITH DR Roxy Manns AT 3PM, CAN YOU PLEASE [place] PATIENT?  THANKS      ----- Message -----         From: Rexene Alberts, MD         Sent: 02/19/2014   2:52 PM           To: Ned Clines            Perhaps on an afternoon the week after next.  Check with Thurmond Butts to look for a day when I have a relatively short case.            ----- Message -----         From: Ned Clines         Sent: 02/19/2014  11:34 AM           To: Rexene Alberts, MD            Received referral for patient, severe mitral regurgitation, [id number], echo/tee 6/26.  Patient requested you, when would you like to see patient?  Thanks            [place]             ------

## 2014-02-20 NOTE — Telephone Encounter (Signed)
RN called an notified patient of schedule appointment. Patient verbalized understanding.

## 2014-02-24 ENCOUNTER — Telehealth: Payer: Self-pay | Admitting: *Deleted

## 2014-02-24 DIAGNOSIS — D689 Coagulation defect, unspecified: Secondary | ICD-10-CM

## 2014-02-24 DIAGNOSIS — Z01818 Encounter for other preprocedural examination: Secondary | ICD-10-CM

## 2014-02-24 DIAGNOSIS — I340 Nonrheumatic mitral (valve) insufficiency: Secondary | ICD-10-CM

## 2014-02-24 NOTE — Telephone Encounter (Signed)
Message copied by Raiford Simmonds on Tue Feb 24, 2014  6:41 PM ------      Message from: Leonie Man      Created: Mon Feb 23, 2014  7:03 PM       Lets plan for that Thursday - I am in the lab then.       Will forward this to my RN Ivin Booty) to go ahead & schedule the R&LHC for 7/23.            Leonie Man, MD            ----- Message -----         From: Rexene Alberts, MD         Sent: 02/23/2014  11:19 AM           To: Leonie Man, MD            He's seeing me on Tues 7/21.  If you don't have time before then it can wait until later.  Whatever works            ----- Message -----         From: Leonie Man, MD         Sent: 02/22/2014   6:27 PM           To: Rexene Alberts, MD            OK - will see if I can get a hold of him - am only in lab on Tuesday of this week & next.  What day is he seeing you?            DH      ----- Message -----         From: Rexene Alberts, MD         Sent: 02/21/2014  10:07 AM           To: Leonie Man, MD            Either way is fine.             ----- Message -----         From: Leonie Man, MD         Sent: 02/20/2014  10:42 PM           To: Rexene Alberts, MD            Should I go ahead & schedule the cath b4 he sees you, or wait?            Leonie Man, MD            ----- Message -----         From: Rexene Alberts, MD         Sent: 02/20/2014   6:17 AM           To: Leonie Man, MD            Got it.  As you noted, he will need R+L cath.  I think he is scheduled to see me the week after next in the office.  Thanks.            ----- Message -----         From: Leonie Man, MD         Sent: 02/19/2014  11:17 PM           To: Rexene Alberts, MD                                                 ------

## 2014-02-24 NOTE — Telephone Encounter (Signed)
SPOKE TO MRS Curless ,  MR Kyllo WAS IN [place].  informed MRS KLEMENS that Dr Ellyn Hack and Dr Roxy Manns would like patient to have right and left heart cath on 7 /23/15. Lab slip will be mailed/faxed [phone] to him. Scheduler will contact patient of time/instruction of cath.per wife ,will do labs 03/03/14  Wife verbalized understanding.

## 2014-02-25 ENCOUNTER — Encounter: Payer: Self-pay | Admitting: Cardiology

## 2014-02-26 ENCOUNTER — Telehealth: Payer: Self-pay | Admitting: Cardiology

## 2014-02-26 NOTE — Telephone Encounter (Signed)
Please call,concerning his xray he is suppose to have before his procedure on 03-03-14.Pt says he was not aware of the xray,he knew he needed lab work.

## 2014-02-26 NOTE — Telephone Encounter (Signed)
Patient called to clarify if he needed CXR since it was in his instruction letter. Informed him this is pre-programmed into the letter and must have not been taken out but that a CXR was NOT ordered and is not needed. Patient voiced understanding.

## 2014-02-27 LAB — CBC
HCT: 39.6 % (ref 39.0–52.0)
Hemoglobin: 14.2 g/dL (ref 13.0–17.0)
MCH: 32.4 pg (ref 26.0–34.0)
MCHC: 35.9 g/dL (ref 30.0–36.0)
MCV: 90.4 fL (ref 78.0–100.0)
PLATELETS: 187 10*3/uL (ref 150–400)
RBC: 4.38 MIL/uL (ref 4.22–5.81)
RDW: 13.2 % (ref 11.5–15.5)
WBC: 4.7 10*3/uL (ref 4.0–10.5)

## 2014-02-27 LAB — BASIC METABOLIC PANEL
BUN: 18 mg/dL (ref 6–23)
CALCIUM: 9.5 mg/dL (ref 8.4–10.5)
CHLORIDE: 105 meq/L (ref 96–112)
CO2: 25 meq/L (ref 19–32)
CREATININE: 0.8 mg/dL (ref 0.50–1.35)
Glucose, Bld: 88 mg/dL (ref 70–99)
Potassium: 4 mEq/L (ref 3.5–5.3)
Sodium: 139 mEq/L (ref 135–145)

## 2014-02-27 LAB — PROTIME-INR
INR: 0.98 (ref <1.50)
Prothrombin Time: 13 seconds (ref 11.6–15.2)

## 2014-02-27 LAB — APTT: aPTT: 33 seconds (ref 24–37)

## 2014-03-03 ENCOUNTER — Institutional Professional Consult (permissible substitution) (INDEPENDENT_AMBULATORY_CARE_PROVIDER_SITE_OTHER): Payer: Federal, State, Local not specified - PPO | Admitting: Thoracic Surgery (Cardiothoracic Vascular Surgery)

## 2014-03-03 ENCOUNTER — Encounter: Payer: Self-pay | Admitting: Thoracic Surgery (Cardiothoracic Vascular Surgery)

## 2014-03-03 VITALS — BP 111/74 | HR 92 | Resp 20 | Ht 76.0 in | Wt 190.0 lb

## 2014-03-03 DIAGNOSIS — I341 Nonrheumatic mitral (valve) prolapse: Secondary | ICD-10-CM

## 2014-03-03 DIAGNOSIS — I059 Rheumatic mitral valve disease, unspecified: Secondary | ICD-10-CM

## 2014-03-03 DIAGNOSIS — I340 Nonrheumatic mitral (valve) insufficiency: Secondary | ICD-10-CM

## 2014-03-03 NOTE — Progress Notes (Signed)
[address]       [place] [address]             [phone]     CARDIOTHORACIC SURGERY CONSULTATION REPORT  Referring Provider is Leonie Man, MD PCP is Donnie Coffin, MD or Lavone Orn, MD  Chief Complaint  Patient presents with  . Mitral Regurgitation    HPI:  Patient is a 58 year old male from [place] with history of coronary artery disease and mitral valve prolapse with mitral regurgitation who has been referred for possible elective mitral valve repair.  The patient's cardiac history dates back to 2004 when he presented with an acute ST segment elevation myocardial infarction involving the anterior wall which was treated with PCI and stenting with overlapping drug-eluting stent things in the left anterior descending coronary artery. The patient recovered uneventfully. At the time he was noted to have a heart murmur on exam, and echocardiograms revealed the presence of mitral valve prolapse with mild mitral regurgitation. He has been followed carefully ever since by a variety different cardiologist, most recently by Dr. Ellyn Hack. Recent followup transthoracic echocardiogram suggested significant increase in severity of mitral regurgitation. This prompted transesophageal echocardiogram which was performed 02/06/2014 and confirmed the presence of a large flail segment of the posterior leaflet with severe (4+) mitral regurgitation. Left ventricular systolic function was mildly reduced with ejection fraction estimated 55-60%. The patient has been referred for elective surgical consultation.  The patient is married and lives in [place] with his wife. 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. He lives a reasonably active physical lifestyle without any particular limitations. He specifically denies any symptoms of exertional shortness of breath. He does admit to some exertional fatigue and occasional tachypalpitations. He denies any  history of exertional chest discomfort. He specifically denies any history of resting shortness of breath, PND, orthopnea, or lower extremity edema.    Past Medical History  Diagnosis Date  . ST elevation myocardial infarction (STEMI) of anterior wall, subsequent episode of care 2004    he had a proximal LAD occlusion treated with 2 overlapping 3.5 x 1.8 mm Cypher DES stents.  Marland Kitchen CAD S/P percutaneous coronary angioplasty 2004    which showed essentially normal LV size and function, moderately dilated left atrium, moderate mitral prolapse with mild to moderate regurgitation.  Marland Kitchen History of stress test 12/2009    he walked 9 mins reaching 10 METS. There was an attenuation artifact in the inferior region but no ischemia or infaract, low risk.  . H/O echocardiogram 09/2011    which showed essentially normal LV size and function, moderately dilated left atrium, moderate mitral prolapse with mild to moderate regurgitation.  . Mitral valve prolapse     Moderate - with moderate MR, noted February t 2013  . Severe mitral regurgitation by prior echocardiogram 02/06/2014    TEE: Severe mitral regurgitation with a flail P2 segment and ruptured; Normal LV size & function, dilated LA.  Marland Kitchen Hypertension   . Dyslipidemia, goal LDL below 70   . GERD (gastroesophageal reflux disease)     Past Surgical History  Procedure Laterality Date  . Cardiac catheterization  2004    he had a proximal LAD occlusion treated with 2 overlapping 3.5 x 1.8 mm Cypher DES stents.  . Cardiac catheterization  2007    where he had widely patent LAD stents, 40% dostal LAD, 30% circumflex and 50-60% inferior bifurcation lesion in the PL with mild anteroapical  hypokinesis.  Marland Kitchen Anterior cruciate ligament repair  1993  . Nm myoview ltd  May 2011    Walk 9 minutes, and 10 METs, diaphragmatic attenuation but no ischemia or infarction.  . Transthoracic echocardiogram  February 2013    Mild L. the dilation (likely normal for size) normal  function greater than 55% EF. Moderate LA dilation. Moderate mitral prolapse of posterior leaflet with mild to moderate mitral regurgitation. -- No significant change since 2010  . Tee without cardioversion N/A 02/06/2014    Procedure: TRANSESOPHAGEAL ECHOCARDIOGRAM (TEE);  Surgeon: Pixie Casino, MD;  Normal LV Size U& function - EF 55-60%, no regional WMA.  MV P2 Leaflet is flail with ruptured chord with severe prolapse, anterior leaflet intact.  Severe, eccentric anterior directed MR with dilated LA.    Family History  Problem Relation Age of Onset  . Hypertension Mother   . Heart Problems Father     triple bypass 1989  . Cancer Paternal Grandmother     Pancreatic    History   Social History  . Marital Status: Single    Spouse Name: N/A    Number of Children: N/A  . Years of Education: N/A   Occupational History  . Not on file.   Social History Main Topics  . Smoking status: Never Smoker   . Smokeless tobacco: Not on file  . Alcohol Use: Yes  . Drug Use: No  . Sexual Activity: Not on file   Other Topics Concern  . Not on file   Social History Narrative   He is a married father of one. Exercises avidly as noted above - runs routinely at least 3 miles 3-4 times a day.  He drinks his Xango fruit juce - 32 Oz. Daily.     Never smoked and only takes occasional alcohol    Current Outpatient Prescriptions  Medication Sig Dispense Refill  . aspirin 81 MG tablet Take 81 mg by mouth. Mon, Wed and Fri      . clopidogrel (PLAVIX) 75 MG tablet TAKE 1 TABLET BY MOUTH EVERY DAY  30 tablet  11  . Coenzyme Q10 (CO Q 10) 100 MG CAPS Take 1 capsule by mouth daily.      Marland Kitchen losartan (COZAAR) 25 MG tablet Take 1 tablet (25 mg total) by mouth daily.  30 tablet  11  . Multiple Vitamins-Minerals (MULTIVITAMINS THER. W/MINERALS) TABS Take 1 tablet by mouth daily.      . Probiotic Product (PROBIOTIC DAILY PO) Take 1 tablet by mouth daily.       No current facility-administered medications  for this visit.    Allergies  Allergen Reactions  . Penicillins       Review of Systems:   General:  normal appetite, decreased energy, no weight gain, no weight loss, no fever  Cardiac:  no chest pain with exertion, no chest pain at rest, no SOB with exertion, no resting SOB, no PND, no orthopnea, + palpitations, no arrhythmia, no atrial fibrillation, no LE edema, no dizzy spells, no syncope  Respiratory:  no shortness of breath, no home oxygen, no productive cough, no dry cough, no bronchitis, no wheezing, no hemoptysis, no asthma, no pain with inspiration or cough, no sleep apnea, no CPAP at night  GI:   no difficulty swallowing, no reflux, no frequent heartburn, no hiatal hernia, no abdominal pain, no constipation, no diarrhea, no hematochezia, no hematemesis, no melena  GU:   no dysuria,  no frequency, no urinary tract infection,  no hematuria, no enlarged prostate, no kidney stones, no kidney disease  Vascular:  no pain suggestive of claudication, no pain in feet, no leg cramps, no varicose veins, no DVT, no non-healing foot ulcer  Neuro:   no stroke, no TIA's, no seizures, no headaches, no temporary blindness one eye,  no slurred speech, no peripheral neuropathy, no chronic pain, no instability of gait, no memory/cognitive dysfunction  Musculoskeletal: mild arthritis in knees, no joint swelling, no myalgias, no difficulty walking, normal mobility   Skin:   no rash, no itching, no skin infections, no pressure sores or ulcerations  Psych:   no anxiety, no depression, no nervousness, no unusual recent stress  Eyes:   no blurry vision, + floaters, no recent vision changes, does not wear glasses or contacts  ENT:   no hearing loss, no loose or painful teeth, no dentures, last saw dentist within the past year  Hematologic:  + easy bruising, no abnormal bleeding, no clotting disorder, no frequent epistaxis  Endocrine:  no diabetes, does not check CBG's at home     Physical Exam:   BP  111/74  Pulse 92  Resp 20  Ht 6\' 4"  (1.93 m)  Wt 190 lb (86.183 kg)  BMI 23.14 kg/m2  SpO2 96%  General:    well-appearing  HEENT:  Unremarkable   Neck:   no JVD, no bruits, no adenopathy   Chest:   clear to auscultation, symmetrical breath sounds, no wheezes, no rhonchi   CV:   RRR, grade IV/VI holosystolic murmur heard all across precordium w/ radiation to back  Abdomen:  soft, non-tender, no masses   Extremities:  warm, well-perfused, pulses palpable, no LE edema  Rectal/GU  Deferred  Neuro:   Grossly non-focal and symmetrical throughout  Skin:   Clean and dry, no rashes, no breakdown   Diagnostic Tests:  Transthoracic Echocardiography  Patient:    Alan, [name] MR #:       [id number] Study Date: 12/26/2013 Gender:     M Age:        71 Height:     193cm Weight:     87.1kg BSA:        2.5m^2 Pt. Status: Room:    ATTENDING    Wynetta Fines  PERFORMING   [facility], Outpatient  SONOGRAPHER  [facility], RDCS cc:  ------------------------------------------------------------ LV EF: 60% -   65%  ------------------------------------------------------------ Indications:      424.0 Mitral valve disease.  ------------------------------------------------------------ History:   PMH:  STEMI (2000)  Coronary artery disease. Mitral valve prolapse.  Risk factors:  Family history of coronary artery disease. Hypertension. Dyslipidemia.  ------------------------------------------------------------ Study Conclusions  - Left ventricle: The cavity size was normal. There was mild   focal basal hypertrophy of the septum. Systolic function   was normal. The estimated ejection fraction was in the   range of 60% to 65%. Wall motion was normal; there were no   regional wall motion abnormalities. Features are   consistent with a pseudonormal left ventricular filling   pattern, with  concomitant abnormal relaxation and   increased filling pressure (grade 2 diastolic   dysfunction). - Mitral valve: Calcified annulus. Severe prolapse,   involving the posterior leaflet. Severe regurgitation   directed eccentrically and anteriorly. - Left atrium: The atrium was moderately dilated. - Pulmonary arteries: Systolic pressure was mildly   increased.  Impressions:  - Normal LV function; severe prolapse of posterior MV   leaflet; eccentric, anteriorly directed MR difficult to   quantitate but most likely severe; suggest TEE to further   assess if clinically indicated. Transthoracic echocardiography.  M-mode, complete 2D, spectral Doppler, and color Doppler.  Height:  Height: 193cm. Height: 76in.  Weight:  Weight: 87.1kg. Weight: 191.6lb.  Body mass index:  BMI: 23.4kg/m^2.  Body surface area:    BSA: 2.74m^2.  Blood pressure:     110/62.  Patient status:  Outpatient.  Location:  Echo laboratory.  ------------------------------------------------------------  ------------------------------------------------------------ Left ventricle:  The cavity size was normal. There was mild focal basal hypertrophy of the septum. Systolic function was normal. The estimated ejection fraction was in the range of 60% to 65%. Wall motion was normal; there were no regional wall motion abnormalities. Features are consistent with a pseudonormal left ventricular filling pattern, with concomitant abnormal relaxation and increased filling pressure (grade 2 diastolic dysfunction).  ------------------------------------------------------------ Aortic valve:   Trileaflet; normal thickness leaflets. Mobility was not restricted.  Doppler:  Transvalvular velocity was within the normal range. There was no stenosis.  No regurgitation.    Mean gradient: 22mm Hg (S). Peak gradient: 81mm Hg (S).  ------------------------------------------------------------ Aorta:  Aortic root: The aortic root was normal  in size.  ------------------------------------------------------------ Mitral valve:   Calcified annulus. Mobility was not restricted.  Severe prolapse, involving the posterior leaflet.  Doppler:  Transvalvular velocity was within the normal range. There was no evidence for stenosis.  Severe regurgitation directed eccentrically and anteriorly.    Peak gradient: 11mm Hg (D).  ------------------------------------------------------------ Left atrium:  LA Volume/BSA= 53.2 ml/m2 The atrium was moderately dilated.  ------------------------------------------------------------ Right ventricle:  The cavity size was normal. Systolic function was normal.  ------------------------------------------------------------ Pulmonic valve:    Doppler:  Transvalvular velocity was within the normal range. There was no evidence for stenosis.  Trivial regurgitation.  ------------------------------------------------------------ Tricuspid valve:   Structurally normal valve.    Doppler: Transvalvular velocity was within the normal range.  Mild regurgitation.  ------------------------------------------------------------ Pulmonary artery:   Systolic pressure was mildly increased.   ------------------------------------------------------------ Right atrium:  The atrium was normal in size.  ------------------------------------------------------------ Pericardium:  There was no pericardial effusion.  ------------------------------------------------------------ Systemic veins: Inferior vena cava: The vessel was normal in size; the respirophasic diameter changes were in the normal range (= 50%); findings are consistent with normal central venous pressure. Diameter: 13.20mm.  ------------------------------------------------------------  2D measurements        Normal  Doppler               Normal IVC                            measurements Diam       13.2 mm     ------  Left ventricle Left ventricle                  Ea, lat      10.8 cm/ ------- LVID ED,   56.2 mm     43-52   ann, tiss         s chord,                         DP PLAX                           E/Ea, lat  14.26     ------- LVID ES,   32.4 mm     23-38   ann, tiss chord,                         DP PLAX                           Ea, med      12.2 cm/ ------- FS, chord,   42 %      >29     ann, tiss         s PLAX                           DP LVPW, ED   9.55 mm     ------  E/Ea, med   12.62     ------- IVS/LVPW    1.3        <1.3    ann, tiss ratio, ED                      DP Ventricular septum             Aortic valve IVS, ED    12.4 mm     ------  Peak vel, S   186 cm/ ------- Aorta                                            s Root diam,   30 mm     ------  Mean vel, S   148 cm/ ------- ED                                               s Left atrium                    VTI, S       37.8 cm  ------- AP dim       43 mm     ------  Mean           11 mm  ------- AP dim     1.97 cm/m^2 <2.2    gradient, S       Hg index                          Peak           14 mm  -------                                gradient, S       Hg                                Mitral valve                                Peak E vel    154 cm/ -------  s                                Peak A vel   90.5 cm/ -------                                                  s                                Deceleratio   275 ms  150-230                                n time                                Peak            9 mm  -------                                gradient, D       Hg                                Peak E/A      1.7     -------                                ratio                                Tricuspid valve                                Regurg peak   278 cm/ -------                                vel               s                                Peak RV-RA     31 mm  -------                                 gradient, S       Hg                                Right ventricle                                Sa vel, lat    16 cm/ -------  ann, tiss         s                                DP   ------------------------------------------------------------ Prepared and Electronically Authenticated by  Kirk Ruths 2015-05-15T12:10:51.130    Transesophageal Echocardiography  Patient:    Alan, [name] MR #:       [id number] Study Date: 02/06/2014 Gender:     M Age:        53 Height:     193 cm Weight:     88.2 kg BSA:        2.17 m^2 Pt. Status: Room:   SONOGRAPHER  Florentina Jenny, RDCS  ADMITTING    Lyman Bishop MD  [place] MD  Maury MD  ORDERING     Glenetta Hew  REFERRING    Glenetta Hew  cc:  ------------------------------------------------------------------- LV EF: 55% -   60%  ------------------------------------------------------------------- Indications:      Mitral regurgitation 424.0.  ------------------------------------------------------------------- Study Conclusions  - Left ventricle: The cavity size was normal. Wall thickness was   normal. Systolic function was normal. The estimated ejection   fraction was in the range of 55% to 60%. Wall motion was normal;   there were no regional wall motion abnormalities. - Aortic valve: No evidence of vegetation. There was trivial   regurgitation. - Mitral valve: There is a flail P2 segment of the posterior mitral   leaflet with ruptured cord that is seen prolapsing into the   atrium past the valve plane. The anterior leaflet appears intact.   There is severe, eccentric, anteriorly/septally directed mitral   regurgitation. - Left atrium: The atrium was dilated. No evidence of thrombus in   the atrial cavity or appendage. - Right atrium: No evidence of thrombus in the atrial cavity or   appendage. - Atrial septum: No defect or  patent foramen ovale was identified. - Tricuspid valve: No evidence of vegetation. - Pulmonic valve: No evidence of vegetation.  Impressions:  - Severe mitral regurgitation with a flail P2 segment and ruptured   cord.  Diagnostic transesophageal echocardiography.  2D and color Doppler.  Birthdate:  Patient birthdate: 12-15-1955.  Age:  Patient is 58 yr old.  Sex:  Gender: male.  Height:  Height: 193 cm. Height: 76 in. Weight:  Weight: 88.2 kg. Weight: 194 lb.  Body mass index:  BMI: 23.7 kg/m^2.  Body surface area:    BSA: 2.17 m^2.  Blood pressure:     139/90  Patient status:  Outpatient.  Study date:  Study date: 02/06/2014. Study time: 10:07 AM.  Location:  Endoscopy.  -------------------------------------------------------------------  ------------------------------------------------------------------- Left ventricle:  The cavity size was normal. Wall thickness was normal. Systolic function was normal. The estimated ejection fraction was in the range of 55% to 60%. Wall motion was normal; there were no regional wall motion abnormalities.  ------------------------------------------------------------------- Aortic valve:   Structurally normal valve. Trileaflet. Cusp separation was normal.  No evidence of vegetation.  Doppler:  There was trivial regurgitation.  ------------------------------------------------------------------- Aorta:  The aorta was normal, not dilated, and non-diseased.  ------------------------------------------------------------------- Mitral valve:  There is a flail P2 segment of the posterior mitral leaflet with ruptured cord that is seen prolapsing into the atrium past the valve plane. The anterior leaflet appears intact. There is severe, eccentric, anteriorly/septally directed mitral regurgitation.  ------------------------------------------------------------------- Left atrium:  The atrium  was dilated.  No evidence of thrombus in the atrial cavity  or appendage.  ------------------------------------------------------------------- Atrial septum:  No defect or patent foramen ovale was identified.   ------------------------------------------------------------------- Right ventricle:  The cavity size was normal. Wall thickness was normal. Systolic function was normal.  ------------------------------------------------------------------- Pulmonic valve:    Structurally normal valve.   Cusp separation was normal.  No evidence of vegetation.  Doppler:  There was trivial regurgitation.  ------------------------------------------------------------------- Tricuspid valve:   Structurally normal valve.   Leaflet separation was normal.  No evidence of vegetation.  Doppler:  There was mild regurgitation.  ------------------------------------------------------------------- Pulmonary artery:   The main pulmonary artery was normal-sized.  ------------------------------------------------------------------- Right atrium:  The atrium was normal in size.  No evidence of thrombus in the atrial cavity or appendage.  ------------------------------------------------------------------- Pericardium:  There was no pericardial effusion.   ------------------------------------------------------------------- Post procedure conclusions Ascending Aorta:  - The aorta was normal, not dilated, and non-diseased.  ------------------------------------------------------------------- Prepared and Electronically Authenticated by  Lyman Bishop MD 2015-06-26T17:09:51    Impression:  The patient has stage C asymptomatic severe primary mitral regurgitation.  I have personally reviewed the patient's recent transthoracic and transesophageal echocardiograms. He has mitral valve prolapse with a large flail segment of the middle scallop (P2) of the posterior leaflet with severe (4+) mitral regurgitation. Left ventricular systolic function is preserved. The patient  does have history of coronary artery disease with PCI and stenting of the left anterior descending coronary artery for treatment of acute ST segment elevation myocardial infarction in the remote past. He will need diagnostic cardiac catheterization performed. In the absence of significant coronary artery disease he would likely be a good candidate for mitral valve repair using minimally invasive approach.   Plan:  The patient and his wife were counseled at length regarding the indications, risks and potential benefits of mitral valve repair.  The rationale for elective surgery has been explained, including a comparison between surgery and continued medical therapy with close follow-up.  The likelihood of successful and durable valve repair has been discussed with particular reference to the findings of their recent echocardiogram.  Based upon these findings and previous experience, I have quoted them a greater than 95 percent likelihood of successful valve repair.   Patient is eager to proceed with surgery in the near future. Once diagnostic cardiac catheterization as been performed we will make final decision as to whether or not surgery may be performed using a minimally invasive approach. We tentatively plan to proceed with surgery on Wednesday, 04/01/2014. The patient will return for followup on Monday, 03/16/2014 to review the results of his catheterization and make final plans for surgery.    I spent in excess of 90 minutes during the conduct of this office consultation and >50% of this time involved direct face-to-face encounter with the patient for counseling and/or coordination of their care.   Valentina Gu. Roxy Manns, MD 03/03/2014 4:24 PM

## 2014-03-04 ENCOUNTER — Other Ambulatory Visit: Payer: Self-pay

## 2014-03-04 ENCOUNTER — Other Ambulatory Visit: Payer: Self-pay | Admitting: *Deleted

## 2014-03-04 ENCOUNTER — Telehealth: Payer: Self-pay | Admitting: Cardiology

## 2014-03-04 DIAGNOSIS — I059 Rheumatic mitral valve disease, unspecified: Secondary | ICD-10-CM

## 2014-03-04 DIAGNOSIS — I340 Nonrheumatic mitral (valve) insufficiency: Secondary | ICD-10-CM

## 2014-03-04 NOTE — Telephone Encounter (Signed)
Pt having a cath on tomorow,need to ask you a question.

## 2014-03-04 NOTE — Telephone Encounter (Signed)
Pt. Asked if it was ok if he had a glass of wine at suppertime the night before his cath,I told him that one glass if it was going to be around 7:00 pm. But not to have anything to eat or drink after midnight, pt. Stated understanding of instructions

## 2014-03-05 ENCOUNTER — Ambulatory Visit (HOSPITAL_COMMUNITY)
Admission: RE | Admit: 2014-03-05 | Discharge: 2014-03-05 | Disposition: A | Discharge status: Home / Self Care | Payer: Federal, State, Local not specified - PPO | Source: Ambulatory Visit | Attending: Cardiology | Admitting: Cardiology

## 2014-03-05 ENCOUNTER — Encounter (HOSPITAL_COMMUNITY)
Admission: RE | Disposition: A | Discharge status: Home / Self Care | Payer: Self-pay | Source: Ambulatory Visit | Attending: Cardiology

## 2014-03-05 ENCOUNTER — Encounter (HOSPITAL_COMMUNITY): Payer: Self-pay | Admitting: Pharmacy Technician

## 2014-03-05 DIAGNOSIS — I340 Nonrheumatic mitral (valve) insufficiency: Secondary | ICD-10-CM

## 2014-03-05 DIAGNOSIS — I10 Essential (primary) hypertension: Secondary | ICD-10-CM | POA: Insufficient documentation

## 2014-03-05 DIAGNOSIS — E785 Hyperlipidemia, unspecified: Secondary | ICD-10-CM | POA: Insufficient documentation

## 2014-03-05 DIAGNOSIS — K219 Gastro-esophageal reflux disease without esophagitis: Secondary | ICD-10-CM | POA: Insufficient documentation

## 2014-03-05 DIAGNOSIS — I252 Old myocardial infarction: Secondary | ICD-10-CM | POA: Insufficient documentation

## 2014-03-05 DIAGNOSIS — I341 Nonrheumatic mitral (valve) prolapse: Secondary | ICD-10-CM

## 2014-03-05 DIAGNOSIS — Z01818 Encounter for other preprocedural examination: Secondary | ICD-10-CM

## 2014-03-05 DIAGNOSIS — Z9861 Coronary angioplasty status: Secondary | ICD-10-CM | POA: Insufficient documentation

## 2014-03-05 DIAGNOSIS — I059 Rheumatic mitral valve disease, unspecified: Secondary | ICD-10-CM | POA: Insufficient documentation

## 2014-03-05 DIAGNOSIS — I2510 Atherosclerotic heart disease of native coronary artery without angina pectoris: Secondary | ICD-10-CM | POA: Insufficient documentation

## 2014-03-05 HISTORY — PX: LEFT AND RIGHT HEART CATHETERIZATION WITH CORONARY ANGIOGRAM: SHX5449

## 2014-03-05 LAB — POCT I-STAT 3, VENOUS BLOOD GAS (G3P V)
ACID-BASE EXCESS: 2 mmol/L (ref 0.0–2.0)
Bicarbonate: 27.8 mEq/L — ABNORMAL HIGH (ref 20.0–24.0)
O2 SAT: 65 %
PO2 VEN: 35 mmHg (ref 30.0–45.0)
TCO2: 29 mmol/L (ref 0–100)
pCO2, Ven: 48.7 mmHg (ref 45.0–50.0)
pH, Ven: 7.365 — ABNORMAL HIGH (ref 7.250–7.300)

## 2014-03-05 LAB — POCT I-STAT 3, ART BLOOD GAS (G3+)
ACID-BASE DEFICIT: 2 mmol/L (ref 0.0–2.0)
Bicarbonate: 24.2 mEq/L — ABNORMAL HIGH (ref 20.0–24.0)
O2 Saturation: 94 %
PH ART: 7.349 — AB (ref 7.350–7.450)
TCO2: 25 mmol/L (ref 0–100)
pCO2 arterial: 43.8 mmHg (ref 35.0–45.0)
pO2, Arterial: 77 mmHg — ABNORMAL LOW (ref 80.0–100.0)

## 2014-03-05 LAB — POCT ACTIVATED CLOTTING TIME: ACTIVATED CLOTTING TIME: 157 s

## 2014-03-05 SURGERY — LEFT AND RIGHT HEART CATHETERIZATION WITH CORONARY ANGIOGRAM
Anesthesia: LOCAL

## 2014-03-05 MED ORDER — ACETAMINOPHEN 325 MG PO TABS: 650.0000 mg | ORAL_TABLET | ORAL | Status: DC | PRN

## 2014-03-05 MED ORDER — MORPHINE SULFATE 2 MG/ML IJ SOLN: 2.0000 mg | INTRAMUSCULAR | Status: DC | PRN

## 2014-03-05 MED ORDER — MIDAZOLAM HCL 2 MG/2ML IJ SOLN
INTRAMUSCULAR | Status: AC
  Filled 2014-03-05: qty 2

## 2014-03-05 MED ORDER — ASPIRIN 81 MG PO CHEW: 81.0000 mg | CHEWABLE_TABLET | ORAL | Status: DC

## 2014-03-05 MED ORDER — LIDOCAINE HCL (PF) 1 % IJ SOLN
INTRAMUSCULAR | Status: AC
  Filled 2014-03-05: qty 30

## 2014-03-05 MED ORDER — ONDANSETRON HCL 4 MG/2ML IJ SOLN: 4.0000 mg | Freq: Four times a day (QID) | INTRAMUSCULAR | Status: DC | PRN

## 2014-03-05 MED ORDER — SODIUM CHLORIDE 0.9 % IV SOLN: 1.0000 mL/kg/h | INTRAVENOUS | Status: DC

## 2014-03-05 MED ORDER — NITROGLYCERIN 1 MG/10 ML FOR IR/CATH LAB
INTRA_ARTERIAL | Status: AC
  Filled 2014-03-05: qty 10

## 2014-03-05 MED ORDER — VERAPAMIL HCL 2.5 MG/ML IV SOLN
INTRAVENOUS | Status: AC
  Filled 2014-03-05: qty 2

## 2014-03-05 MED ORDER — SODIUM CHLORIDE 0.9 % IV SOLN
INTRAVENOUS | Status: DC
  Administered 2014-03-05: 08:00:00 via INTRAVENOUS

## 2014-03-05 MED ORDER — ONDANSETRON HCL 4 MG/2ML IJ SOLN
INTRAMUSCULAR | Status: AC
  Filled 2014-03-05: qty 2

## 2014-03-05 MED ORDER — HEPARIN (PORCINE) IN NACL 2-0.9 UNIT/ML-% IJ SOLN
INTRAMUSCULAR | Status: AC
  Filled 2014-03-05: qty 1000

## 2014-03-05 MED ORDER — SODIUM CHLORIDE 0.9 % IJ SOLN
3.0000 mL | INTRAMUSCULAR | Status: DC | PRN
Start: 2014-03-05 — End: 2014-03-05

## 2014-03-05 MED ORDER — DOPAMINE-DEXTROSE 3.2-5 MG/ML-% IV SOLN
INTRAVENOUS | Status: AC
  Filled 2014-03-05: qty 250

## 2014-03-05 MED ORDER — SODIUM CHLORIDE 0.9 % IV SOLN
250.0000 mL | INTRAVENOUS | Status: DC | PRN
Start: 2014-03-05 — End: 2014-03-05

## 2014-03-05 MED ORDER — SODIUM CHLORIDE 0.9 % IJ SOLN: 3.0000 mL | Freq: Two times a day (BID) | INTRAMUSCULAR | Status: DC

## 2014-03-05 MED ORDER — FENTANYL CITRATE 0.05 MG/ML IJ SOLN
INTRAMUSCULAR | Status: AC
  Filled 2014-03-05: qty 2

## 2014-03-05 NOTE — CV Procedure (Signed)
CARDIAC CATHETERIZATION REPORT  NAME:  Alan [name]   MRN: [id number] DOB:  Jun 01, 1956   ADMIT DATE: 03/05/2014 Procedure Date: 03/05/2014  INTERVENTIONAL CARDIOLOGIST: Leonie Man, M.D., MS PRIMARY CARE PROVIDER: Donnie Coffin, MD PRIMARY CARDIOLOGIST: Leonie Man, MD, MS  PATIENT:  Alan [name] is a 58 y.o. male with prior Anterior STEMI - PCI to LAD as well as Mitral Regurgitation / MVP.  He has remained symptomatically stable, but surveillance Echocardiography identified significant progression of MR confirmed by TEE to be Severe MR.  He has been referred to CT Surgery with planned MVR in August.  He now presents for R&LHC.  PRE-OPERATIVE DIAGNOSIS:    CAD- s/p PCI to LAD (for Anterior STEMI)  Severe MR with MVP  PROCEDURES PERFORMED:    Right & Left Heart Catheterization with Native Coronary Angiography  via Right Radial Artery & Right Brachial Vein Access.  PROCEDURE: The patient was brought to the [facility] in the fasting state and prepped and draped in the usual sterile fashion for Right radial & brahcial access. A modified Allen's test was performed on the Right wrist demonstrating excellent collateral flow. Sterile technique was used including antiseptics, cap, gloves, gown, hand hygiene, mask and sheet. Skin prep: Chlorhexidine.   Consent: Risks of procedure as well as the alternatives and risks of each were explained to the (patient/caregiver). Consent for procedure obtained.   Time Out: Verified patient identification, verified procedure, site/side was marked, verified correct patient position, special equipment/implants available, medications/allergies/relevent history reviewed, required imaging and test results available. Performed.  Access:  Right Radial Artery: 6 Fr sheath -- Seldinger technique using Angiocath Micropuncture Kit 10 mL radial cocktail IA; 4500 Units IV Heparin Right Brachial/Antecubital Vein: The  existing 18-gauge IV was exchanged over a wire for a 5Fr short sheath --> after initial attempts to advance the Swan Ganz catheter were unsuccessful, this sheath was exchanged for a standard 5Fr glide sheath.  Right Heart Catheterization: 5 Fr Gordy Councilman catheter advanced under fluoroscopy with balloon inflated to the RA, RV, then PCWP-PA for hemodynamic measurement.  Simultaneous FA & PA blood gases checked for SaO2% to calculate FICK CO/CI  Simultaneous PCWP/LV & RV/LV pressures monitored with Angled Pigtail in LV.  Catheter removed completely out of the body with balloon deflated.  Left Heart Catheterization: 5Fr Catheters advanced or exchanged over a J-wire; TIG 4.0 catheter advanced first.  LV Hemodynamics (LV Gram): Angled Pigtail Left Coronary Artery Cineangiography: TIG 4.0 & EBU Guide Catheter  Right Coronary Artery Cineangiography: TIG 4.0 Catheter   Venous Sheath removed in the holding area with manual pressure for hemostasis.  TR band:  1020 hours; 13 mL he   EBL: < 10 ml, not including ABG and VBG samples   FINDINGS:  Hemodynamics:  Findings:   SaO2%  Pressures mmHg  Mean P  mmHg  EDP  mmHg   Right Atrium   8/6  6  Right Ventricle    45/7    17   Pulmonary Artery   65   41/17   27    PCWP   large V wave   24/35   21    Central Aortic   94   96/53   59    Left Ventricle   99/12    25          Cardiac Output:   Cardiac Index:    Fick   5.15    2.37  Left Ventriculography: Deferred  Coronary Anatomy: Right dominant  Left Main: Large caliber vessel that trifurcates into the LAD, Circumflex and Ramus Intermedius. This is a short vessel but free of disease. LAD: A large caliber vessel with a proximal stent is widely patent. There does appear to be a step up and step down with the stent being larger diameter than the rest the vessel. There is about 30-40% stenosis just prior to the stent. The downstream LAD has a distal 40% stenosis near the apex.  D1: Small-caliber  vessel it comes off from the stented segment. Minimal luminal irregularity. There is some mild ostial disease.  D2: Moderate caliber vessel with at least 4 branches covering the anterolateral wall. Angiographically normal. Left Circumflex: Moderate to large caliber vessel with mild little irregular it is. There is a small caliber mid OM branch as well as another smaller OM branch for the vessel itself terminates as a large inferolateral OM/LPL with mild little irregularities.  Ramus intermedius: Small to moderate caliber vessel with proximal 40% tubular stenosis. The vessel only covers about two thirds of the anterolateral wall.   RCA: Large caliber, dominant vessel that bifurcates distally into the Right Posterior Descending Artery (RPDA) and the  Right Posterior AV Groove Branch (RPAV). Just prior to bifurcation is roughly 40% stenosis.  RPDA: Moderate caliber vessel with a proximal roughly  40% stenosis  RPL Sysytem:The RPAV begins a large caliber vessel is of major illnesses. There is a smaller branch that has 40-50% proximal stenosis.  MEDICATIONS:  Anesthesia:  Local Lidocaine 3 ml  Sedation:  2 mg IV Versed, 25 mcg IV fentanyl ;  Omnipaque Contrast: 140 ml  Anticoagulation:  IV Heparin 4500 Units Radial Cocktail: 5 mg Verapamil, 400 mcg NTG, 2 ml 2% Lidocaine in 10 ml NS IV NTG 200 mcg x 1 500 mL NS Bolus Short run of Dopamine infusion at 10 mcg/mn reduced to 0 prior to completion of the procedure.  PATIENT DISPOSITION:    The patient was transferred to the PACU holding area in a hemodynamicaly stable, chest pain free condition.  The patient tolerated the procedure well, and there were no complications. He did get hypotensive with mild bradycardia in high 40s  The patient was stable before, during, and after the procedure.  POST-OPERATIVE DIAGNOSIS:    Moderate ~40% pre-stent stenosis in proximal LAD otherwise minimal CAD  Mildly elevated pulmonary pressures with normal  Cardiac Output & Index by FICK.  Large V wave on PCWP waveform confirming Severe MR.  PLAN OF CARE:  Standard post Radial cath care - will monitor an additional hour to ensure BP remains stable.  Discharge post bedrest  Plan to proceed with MVR via L thoracotomy minimally invasive approach, will need to stop Plavix 5-7 days pre-op.  F/u with Dr. Ellyn Hack post -op.   Leonie Man, M.D., M.S. [facility] [address] [place], [place]  [address]  [phone]  03/05/2014 10:25 AM

## 2014-03-05 NOTE — Interval H&P Note (Signed)
History and Physical Interval Note:  03/05/2014 7:29 AM  Alan [name]  has presented today for surgery, with the diagnosis of Severe MR - Plan for MVR by Dr. Roxy Manns.  The various methods of treatment have been discussed with the patient and family. After consideration of risks, benefits and other options for treatment, the patient has consented to  Procedure(s): LEFT AND RIGHT HEART CATHETERIZATION WITH CORONARY ANGIOGRAM (N/A) as a surgical intervention .  The patient's history has been reviewed, patient examined, no change in status, stable for surgery.  I have reviewed the patient's chart and labs.  Questions were answered to the patient's satisfaction.     HARDING,DAVID W

## 2014-03-05 NOTE — Discharge Instructions (Signed)
Radial Site Care °Refer to this sheet in the next few weeks. These instructions provide you with information on caring for yourself after your procedure. Your caregiver may also give you more specific instructions. Your treatment has been planned according to current medical practices, but problems sometimes occur. Call your caregiver if you have any problems or questions after your procedure. °HOME CARE INSTRUCTIONS °· You may shower the day after the procedure. Remove the bandage (dressing) and gently wash the site with plain soap and water. Gently pat the site dry. °· Do not apply powder or lotion to the site. °· Do not submerge the affected site in water for 3 to 5 days. °· Inspect the site at least twice daily. °· Do not flex or bend the affected arm for 24 hours. °· No lifting over 5 pounds (2.3 kg) for 5 days after your procedure. °· Do not drive home if you are discharged the same day of the procedure. Have someone else drive you. °· You may drive 24 hours after the procedure unless otherwise instructed by your caregiver. °· Do not operate machinery or power tools for 24 hours. °· A responsible adult should be with you for the first 24 hours after you arrive home. °What to expect: °· Any bruising will usually fade within 1 to 2 weeks. °· Blood that collects in the tissue (hematoma) may be painful to the touch. It should usually decrease in size and tenderness within 1 to 2 weeks. °SEEK IMMEDIATE MEDICAL CARE IF: °· You have unusual pain at the radial site. °· You have redness, warmth, swelling, or pain at the radial site. °· You have drainage (other than a small amount of blood on the dressing). °· You have chills. °· You have a fever or persistent symptoms for more than 72 hours. °· You have a fever and your symptoms suddenly get worse. °· Your arm becomes pale, cool, tingly, or numb. °· You have heavy bleeding from the site. Hold pressure on the site. °Document Released: 09/02/2010 Document Revised:  10/23/2011 Document Reviewed: 09/02/2010 °ExitCare® Patient Information ©2015 [employer]. This information is not intended to replace advice given to you by your health care provider. Make sure you discuss any questions you have with your health care provider. ° °

## 2014-03-05 NOTE — H&P (View-Only) (Signed)
PCP: Donnie Coffin, MD  Clinic Note: Chief Complaint  Patient presents with  . Follow-up    results of TEE    HPI: Alan [name] is a 58 y.o. male with a Cardiovascular Problem List below who presents today for followup of the trans-esophageal echocardiogram for mitral prolapse and regurgitation. 2D Transthroacic Echocardiogram on May 15 which revealed normal EF of 60% with severe posterior mitral valve leaflet prolapse with severe mitral regurgitation. Recommended TEE during 6/19 clinic visit. TEE 6/26: Severe mitral regurgitation with a flail P2 segment and ruptured chord. Recommendation: MVR.  Interval History: Today he returns to discuss the TEE findings and confirm the plan. He continues to deny any significant symptoms, besides a slight decline in exercise tolerance.  Otherwise denies any chest tightness or pressure with rest or exertion. No exertional dyspnea unless he is really pushing it. He is an avid runner. Denies any PND, orthopnea or edema. No rapid or irregular heartbeat/palpitations. No TIA/amaurosis fugax, syncope/near-syncope symptoms. No melena, hematochezia hematuria, epistaxis or claudication.  Past Medical History  Diagnosis Date  . ST elevation myocardial infarction (STEMI) of anterior wall, subsequent episode of care 2004    he had a proximal LAD occlusion treated with 2 overlapping 3.5 x 1.8 mm Cypher DES stents.  Marland Kitchen CAD S/P percutaneous coronary angioplasty 2004    which showed essentially normal LV size and function, moderately dilated left atrium, moderate mitral prolapse with mild to moderate regurgitation.  Marland Kitchen History of stress test 12/2009    he walked 9 mins reaching 10 METS. There was an attenuation artifact in the inferior region but no ischemia or infaract, low risk.  . H/O echocardiogram 09/2011    which showed essentially normal LV size and function, moderately dilated left atrium, moderate mitral prolapse with mild to moderate regurgitation.  .  Mitral valve prolapse     Moderate - with moderate MR, noted February t 2013  . Severe mitral regurgitation by prior echocardiogram 02/06/2014    TEE: Severe mitral regurgitation with a flail P2 segment and ruptured; Normal LV size & function, dilated LA.  Marland Kitchen Hypertension   . Dyslipidemia, goal LDL below 70   . GERD (gastroesophageal reflux disease)     Prior Cardiac Evaluation and Past Surgical History: Past Surgical History  Procedure Laterality Date  . Cardiac catheterization  2004    he had a proximal LAD occlusion treated with 2 overlapping 3.5 x 1.8 mm Cypher DES stents.  . Cardiac catheterization  2007    where he had widely patent LAD stents, 40% dostal LAD, 30% circumflex and 50-60% inferior bifurcation lesion in the PL with mild anteroapical hypokinesis.  Marland Kitchen Anterior cruciate ligament repair  1993  . Nm myoview ltd  May 2011    Walk 9 minutes, and 10 METs, diaphragmatic attenuation but no ischemia or infarction.  . Transthoracic echocardiogram  February 2013    Mild L. the dilation (likely normal for size) normal function greater than 55% EF. Moderate LA dilation. Moderate mitral prolapse of posterior leaflet with mild to moderate mitral regurgitation. -- No significant change since 2010  . Tee without cardioversion N/A 02/06/2014    Procedure: TRANSESOPHAGEAL ECHOCARDIOGRAM (TEE);  Surgeon: Pixie Casino, MD;  Normal LV Size U& function - EF 55-60%, no regional WMA.  MV P2 Leaflet is flail with ruptured chord with severe prolapse, anterior leaflet intact.  Severe, eccentric anterior directed MR with dilated LA.    MEDICATIONS AND ALLERGIES REVIEWED IN EPIC No Change in Social  and Family History  ROS: A comprehensive Review of Systems - Negative except Mild joint aches and pains and is slightly decreased exercise tolerance  PHYSICAL EXAM BP 126/70  Pulse 74  Ht 6\' 4"  (1.93 m)  Wt 197 lb 1.6 oz (89.404 kg)  BMI 24.00 kg/m2 General appearance: alert, cooperative, no  distress and Pleasant mood and affect. Very healthy-appearing  Neck: no adenopathy, no carotid bruit, no JVD, supple, symmetrical, trachea midline and thyroid not enlarged, symmetric, no tenderness/mass/nodules  Lungs: clear to auscultation bilaterally, normal percussion bilaterally and Nonlabored, good air movement  Heart: RRR, normal S1-S2. There is a soft midsystolic click with 2-6/9 HSM this high pitched and blowing ; nondisplaced PMI. No other rubs or gallops.  Abdomen: soft, non-tender; bowel sounds normal; no masses, no organomegaly  Extremities: extremities normal, atraumatic, no cyanosis or edema  Pulses: 2+ and symmetric  Neurologic: Alert and oriented X 3, normal strength and tone. Normal symmetric reflexes. Normal coordination and gait   Adult ECG Report - not performed  Recent Labs: None  ASSESSMENT / PLAN: Severre Mitral valve prolapse - severe prolapse of the posterior (P2) leaflet with severe MR Due to worsening clinical exam finding, was referred for re-evaluation of MVP/MR with echo done in June that confirmed progression of disease to Severe MR/MVP of Posterior Leaflet --> subsequently confirmed by TEE with Severe mitral regurgitation with a flail P2 segment and ruptured cord.  Thankfully EF remains relatively preserved by ~55-60%.  No SSx of CHF. Plan: Referral to Dr. Darylene Price of CT Surgery for consideration of MVR given the severity of MR & flail P2.  With h/o CAD, would anticipate potential need for pre-op R&LHC.  Will await initial consultation with Dr. Roxy Manns to confirm plans & will remain ready to schedule R&LHC if requested.  He denies angina Sx, but has not had an ischemic evaluation since 2011.   Severe mitral regurgitation Significant progression of disease to "Surgical" grade severity. CVTS Referral - Dr. Roxy Manns for MVR.  Remain ready to schedule R&LHC pre-op if requested.  Continue ARB for afterload reduction - not much BP room for additional RX.   CAD S/P  percutaneous coronary angioplasty - DES PCI to occluded LAD during anterior STEMI Remains asymptomatic.   No BB due to intolerance related to negative chronotropic effects.  On DAPT - would need to hold pero-operatively.  Not on statin - also mild intolerance due to mylagias & pt preference to avoid. Anticipate need for pre-op R&LHC.   Essential hypertension Well controlled on low dose ARB.  Dyslipidemia, goal LDL below 70 Monitored by PCP.  Reports adequate control.  No Statin due to h/o myalgias.  Pre-op testing Anticipate need for Pre-op R&LHC. Has had labs for TEE - if able to schedule within 30 days, can avoid rechecking labs.   I spent > 50 % of the interview discussing TEE findings & recommendations for referral to CVTS for MVR, likely need for R&LHC. Multiple questions addressed including natural progression of disease without surgery, explanation of the surgery, recovery time, pre-op testing, post-op follow-up, potential complications.  Most of the discussion was for the benefit of his wife, who was not present during his pre-TEE visit.  Orders Placed This Encounter  Procedures  . Ambulatory referral to Cardiothoracic Surgery    Referral Priority:  Routine    Referral Type:  Surgical    Referral Reason:  Specialty Services Required    Referred to Provider:  Rexene Alberts, MD  Requested Specialty:  Cardiothoracic Surgery    Number of Visits Requested:  1   No orders of the defined types were placed in this encounter.     Followup: 1-2 months - post-op AVR  DAVID W. Ellyn Hack, M.D., M.S. Interventional Cardiologist [facility]

## 2014-03-06 ENCOUNTER — Telehealth: Payer: Self-pay | Admitting: Internal Medicine

## 2014-03-06 NOTE — Telephone Encounter (Signed)
Mr. Rousseau called to discuss a headache he's having. He had an uneventful [facility] today with plans for a minimally invasive MV repair. He's had a posterior headache tonight with pressure. It is not the worst headache of his life but he doesn't get headaches often. Pressure sensation, posterior, mild to moderate improvement with tylenol. No focal neurological deficits. He was initially able to sleep. He has no vision changes, no fever/chills, no weakness, walking well, no dizziness/syncope. He is with his wife. We discussed trying ibuprofen and if symptoms persist, worsening or warning signs add on to call 911. He verbalized understanding. He will call the office in AM or PCP if still gradually persistent.

## 2014-03-10 ENCOUNTER — Telehealth: Payer: Self-pay | Admitting: Cardiology

## 2014-03-10 ENCOUNTER — Encounter (HOSPITAL_COMMUNITY): Payer: Self-pay | Admitting: Emergency Medicine

## 2014-03-10 ENCOUNTER — Emergency Department (HOSPITAL_COMMUNITY)
Admission: EM | Admit: 2014-03-10 | Discharge: 2014-03-10 | Disposition: A | Discharge status: Home / Self Care | Payer: Federal, State, Local not specified - PPO | Attending: Emergency Medicine | Admitting: Emergency Medicine

## 2014-03-10 DIAGNOSIS — Z8719 Personal history of other diseases of the digestive system: Secondary | ICD-10-CM | POA: Insufficient documentation

## 2014-03-10 DIAGNOSIS — I10 Essential (primary) hypertension: Secondary | ICD-10-CM | POA: Insufficient documentation

## 2014-03-10 DIAGNOSIS — Z862 Personal history of diseases of the blood and blood-forming organs and certain disorders involving the immune mechanism: Secondary | ICD-10-CM | POA: Insufficient documentation

## 2014-03-10 DIAGNOSIS — R5383 Other fatigue: Secondary | ICD-10-CM

## 2014-03-10 DIAGNOSIS — R112 Nausea with vomiting, unspecified: Secondary | ICD-10-CM | POA: Insufficient documentation

## 2014-03-10 DIAGNOSIS — Z880 Allergy status to penicillin: Secondary | ICD-10-CM | POA: Insufficient documentation

## 2014-03-10 DIAGNOSIS — R5381 Other malaise: Secondary | ICD-10-CM | POA: Insufficient documentation

## 2014-03-10 DIAGNOSIS — I252 Old myocardial infarction: Secondary | ICD-10-CM | POA: Insufficient documentation

## 2014-03-10 DIAGNOSIS — I2510 Atherosclerotic heart disease of native coronary artery without angina pectoris: Secondary | ICD-10-CM | POA: Insufficient documentation

## 2014-03-10 DIAGNOSIS — Z7982 Long term (current) use of aspirin: Secondary | ICD-10-CM | POA: Insufficient documentation

## 2014-03-10 DIAGNOSIS — Z8639 Personal history of other endocrine, nutritional and metabolic disease: Secondary | ICD-10-CM | POA: Insufficient documentation

## 2014-03-10 DIAGNOSIS — Z9861 Coronary angioplasty status: Secondary | ICD-10-CM | POA: Insufficient documentation

## 2014-03-10 DIAGNOSIS — Z9889 Other specified postprocedural states: Secondary | ICD-10-CM | POA: Insufficient documentation

## 2014-03-10 DIAGNOSIS — Z7902 Long term (current) use of antithrombotics/antiplatelets: Secondary | ICD-10-CM | POA: Insufficient documentation

## 2014-03-10 DIAGNOSIS — R110 Nausea: Secondary | ICD-10-CM | POA: Insufficient documentation

## 2014-03-10 DIAGNOSIS — Z79899 Other long term (current) drug therapy: Secondary | ICD-10-CM | POA: Insufficient documentation

## 2014-03-10 LAB — COMPREHENSIVE METABOLIC PANEL
ALT: 17 U/L (ref 0–53)
ANION GAP: 18 — AB (ref 5–15)
AST: 23 U/L (ref 0–37)
Albumin: 4.2 g/dL (ref 3.5–5.2)
Alkaline Phosphatase: 66 U/L (ref 39–117)
BUN: 22 mg/dL (ref 6–23)
CHLORIDE: 106 meq/L (ref 96–112)
CO2: 21 mEq/L (ref 19–32)
Calcium: 9.5 mg/dL (ref 8.4–10.5)
Creatinine, Ser: 0.71 mg/dL (ref 0.50–1.35)
Glucose, Bld: 110 mg/dL — ABNORMAL HIGH (ref 70–99)
POTASSIUM: 4.6 meq/L (ref 3.7–5.3)
SODIUM: 145 meq/L (ref 137–147)
TOTAL PROTEIN: 7 g/dL (ref 6.0–8.3)
Total Bilirubin: 1.1 mg/dL (ref 0.3–1.2)

## 2014-03-10 LAB — CBC WITH DIFFERENTIAL/PLATELET
BASOS ABS: 0 10*3/uL (ref 0.0–0.1)
BASOS PCT: 0 % (ref 0–1)
EOS ABS: 0.1 10*3/uL (ref 0.0–0.7)
Eosinophils Relative: 1 % (ref 0–5)
HCT: 45.9 % (ref 39.0–52.0)
Hemoglobin: 15.7 g/dL (ref 13.0–17.0)
Lymphocytes Relative: 6 % — ABNORMAL LOW (ref 12–46)
Lymphs Abs: 0.5 10*3/uL — ABNORMAL LOW (ref 0.7–4.0)
MCH: 32.3 pg (ref 26.0–34.0)
MCHC: 34.2 g/dL (ref 30.0–36.0)
MCV: 94.4 fL (ref 78.0–100.0)
MONOS PCT: 7 % (ref 3–12)
Monocytes Absolute: 0.6 10*3/uL (ref 0.1–1.0)
NEUTROS ABS: 7.9 10*3/uL — AB (ref 1.7–7.7)
NEUTROS PCT: 86 % — AB (ref 43–77)
PLATELETS: 169 10*3/uL (ref 150–400)
RBC: 4.86 MIL/uL (ref 4.22–5.81)
RDW: 12.5 % (ref 11.5–15.5)
WBC: 9.2 10*3/uL (ref 4.0–10.5)

## 2014-03-10 LAB — TROPONIN I

## 2014-03-10 MED ORDER — SODIUM CHLORIDE 0.9 % IV SOLN
1000.0000 mL | Freq: Once | INTRAVENOUS | Status: AC
  Administered 2014-03-10: 1000 mL via INTRAVENOUS

## 2014-03-10 MED ORDER — ONDANSETRON HCL 4 MG/2ML IJ SOLN
4.0000 mg | Freq: Once | INTRAMUSCULAR | Status: AC
  Administered 2014-03-10: 4 mg via INTRAVENOUS
  Filled 2014-03-10: qty 2

## 2014-03-10 MED ORDER — ONDANSETRON 8 MG PO TBDP: 8.0000 mg | ORAL_TABLET | Freq: Three times a day (TID) | ORAL | Status: DC | PRN

## 2014-03-10 MED ORDER — SODIUM CHLORIDE 0.9 % IV SOLN: 1000.0000 mL | INTRAVENOUS | Status: DC

## 2014-03-10 NOTE — Discharge Instructions (Signed)

## 2014-03-10 NOTE — ED Notes (Signed)
Per pt sts that he woke up this am feeling weak and very sick. Denies any pain. sts some SOB. Pt scheduled for a valve replacement in a few weeks.

## 2014-03-10 NOTE — ED Notes (Signed)
D/c I/v

## 2014-03-10 NOTE — Telephone Encounter (Signed)
Pt's wife called, Pt woke this AM with nausea and feeling very weak.  She is bringing him to ER at [facility], I agree, MVR is scheduled for 04/01/14.

## 2014-03-10 NOTE — ED Provider Notes (Signed)
CSN: [id number]     Arrival date & time 03/10/14  0709 History   First MD Initiated Contact with Patient 03/10/14 [phone]     Chief Complaint  Patient presents with  . Nausea  . Weakness    The history is provided by the patient, medical records and the spouse.   Patient presents with nausea and vomiting this morning.  He states he feels weak.  He was in his normal state of health yesterday.  He does have a history of severe mitral valve prolapse with plans for surgery.  Denies chest pain.  Denies any significant shortness of breath.  Denies exertional shortness of breath.  Continues to work out.  Patient with right radial heart catheterization performed 5 days ago which demonstrates nonobstructive coronary disease.  This was done in a preop fashion for his mitral valve procedure.  The chest pain at this time.  Denies abdominal pain.  No abdominal cramping.  Denies diarrhea.  No lightheadedness or syncope.  No sick contacts.  Patient vomited on arrival to emergency department with nonbloody or bilious material.  He states after vomiting he feels much better.  His wife reports that his color is better.  He is currently drinking water and states he feels better during the history and exam.  He denies fevers and chills.  He denies urinary complaints.   Past Medical History  Diagnosis Date  . ST elevation myocardial infarction (STEMI) of anterior wall, subsequent episode of care 2004    he had a proximal LAD occlusion treated with 2 overlapping 3.5 x 1.8 mm Cypher DES stents.  Marland Kitchen CAD S/P percutaneous coronary angioplasty 2004    which showed essentially normal LV size and function, moderately dilated left atrium, moderate mitral prolapse with mild to moderate regurgitation.  Marland Kitchen History of stress test 12/2009    he walked 9 mins reaching 10 METS. There was an attenuation artifact in the inferior region but no ischemia or infaract, low risk.  . H/O echocardiogram 09/2011    which showed essentially normal  LV size and function, moderately dilated left atrium, moderate mitral prolapse with mild to moderate regurgitation.  . Mitral valve prolapse     Moderate - with moderate MR, noted February t 2013  . Severe mitral regurgitation by prior echocardiogram 02/06/2014    TEE: Severe mitral regurgitation with a flail P2 segment and ruptured; Normal LV size & function, dilated LA.  Marland Kitchen Hypertension   . Dyslipidemia, goal LDL below 70   . GERD (gastroesophageal reflux disease)    Past Surgical History  Procedure Laterality Date  . Cardiac catheterization  2004    he had a proximal LAD occlusion treated with 2 overlapping 3.5 x 1.8 mm Cypher DES stents.  . Cardiac catheterization  2007    where he had widely patent LAD stents, 40% dostal LAD, 30% circumflex and 50-60% inferior bifurcation lesion in the PL with mild anteroapical hypokinesis.  Marland Kitchen Anterior cruciate ligament repair  1993  . Nm myoview ltd  May 2011    Walk 9 minutes, and 10 METs, diaphragmatic attenuation but no ischemia or infarction.  . Transthoracic echocardiogram  February 2013    Mild L. the dilation (likely normal for size) normal function greater than 55% EF. Moderate LA dilation. Moderate mitral prolapse of posterior leaflet with mild to moderate mitral regurgitation. -- No significant change since 2010  . Tee without cardioversion N/A 02/06/2014    Procedure: TRANSESOPHAGEAL ECHOCARDIOGRAM (TEE);  Surgeon: Pixie Casino, MD;  Normal LV Size U& function - EF 55-60%, no regional WMA.  MV P2 Leaflet is flail with ruptured chord with severe prolapse, anterior leaflet intact.  Severe, eccentric anterior directed MR with dilated LA.   Family History  Problem Relation Age of Onset  . Hypertension Mother   . Heart Problems Father     triple bypass 1989  . Cancer Paternal Grandmother     Pancreatic   History  Substance Use Topics  . Smoking status: Never Smoker   . Smokeless tobacco: Not on file  . Alcohol Use: Yes    Review of  Systems  Neurological: Positive for weakness.  All other systems reviewed and are negative.     Allergies  Amoxicillin; Other; and Penicillins  Home Medications   Prior to Admission medications   Medication Sig Start Date End Date Taking? Authorizing Provider  aspirin 81 MG tablet Take 81 mg by mouth daily.    Yes Historical Provider, MD  cholecalciferol (VITAMIN D) 1000 UNITS tablet Take 1,000 Units by mouth daily.   Yes Historical Provider, MD  clopidogrel (PLAVIX) 75 MG tablet Take 75 mg by mouth daily.   Yes Historical Provider, MD  Coenzyme Q10 (CO Q 10) 100 MG CAPS Take 1 capsule by mouth daily.   Yes Historical Provider, MD  losartan (COZAAR) 25 MG tablet Take 1 tablet (25 mg total) by mouth daily. 03/10/13  Yes Leonie Man, MD  Multiple Vitamins-Minerals (MULTIVITAMINS THER. W/MINERALS) TABS Take 1 tablet by mouth daily.   Yes Historical Provider, MD  Probiotic Product (PROBIOTIC DAILY PO) Take 1 tablet by mouth daily.   Yes Historical Provider, MD  ondansetron (ZOFRAN ODT) 8 MG disintegrating tablet Take 1 tablet (8 mg total) by mouth every 8 (eight) hours as needed for nausea or vomiting. 03/10/14   Hoy Morn, MD   BP 117/78  Pulse 87  Temp(Src) 98 F (36.7 C) (Oral)  Resp 12  SpO2 96% Physical Exam  Nursing note and vitals reviewed. Constitutional: He is oriented to person, place, and time. He appears well-developed and well-nourished.  HENT:  Head: Normocephalic and atraumatic.  Eyes: EOM are normal.  Neck: Normal range of motion.  Cardiovascular: Normal rate, regular rhythm, normal heart sounds and intact distal pulses.   Pulmonary/Chest: Effort normal and breath sounds normal. No respiratory distress.  Abdominal: Soft. He exhibits no distension. There is no tenderness.  Musculoskeletal: Normal range of motion.  Neurological: He is alert and oriented to person, place, and time.  Skin: Skin is warm and dry.  Psychiatric: He has a normal mood and affect.  Judgment normal.    ED Course  Procedures (including critical care time) Labs Review Labs Reviewed  CBC WITH DIFFERENTIAL - Abnormal; Notable for the following:    Neutrophils Relative % 86 (*)    Neutro Abs 7.9 (*)    Lymphocytes Relative 6 (*)    Lymphs Abs 0.5 (*)    All other components within normal limits  COMPREHENSIVE METABOLIC PANEL - Abnormal; Notable for the following:    Glucose, Bld 110 (*)    Anion gap 18 (*)    All other components within normal limits  TROPONIN I    Imaging Review No results found.   EKG Interpretation   Date/Time:  Tuesday March 10 2014 07:10:19 EDT Ventricular Rate:  94 PR Interval:  152 QRS Duration: 92 QT Interval:  368 QTC Calculation: 460 R Axis:   86 Text Interpretation:  Normal sinus rhythm with  sinus arrhythmia Normal ECG  No significant change was found Confirmed by Cartez Mogle  MD, Royetta Probus ([id number]) on  03/10/2014 7:49:17 AM      MDM   Final diagnoses:  Nausea    8:43 AM Patient feels much better at this time.  Discharge home in good condition.  Likely viral process.  Home with Zofran.  Repeat abdominal exam is benign.  I do not believe this is related to his mitral valve prolapse.  He understands to return to the ER for new or worsening symptoms.     Hoy Morn, MD 03/10/14 [phone]

## 2014-03-11 ENCOUNTER — Other Ambulatory Visit: Payer: Self-pay | Admitting: *Deleted

## 2014-03-11 DIAGNOSIS — I7100 Dissection of unspecified site of aorta: Secondary | ICD-10-CM

## 2014-03-11 DIAGNOSIS — I059 Rheumatic mitral valve disease, unspecified: Secondary | ICD-10-CM

## 2014-03-13 ENCOUNTER — Ambulatory Visit
Admission: RE | Admit: 2014-03-13 | Discharge: 2014-03-13 | Disposition: A | Discharge status: Home / Self Care | Payer: Federal, State, Local not specified - PPO | Source: Ambulatory Visit | Attending: Thoracic Surgery (Cardiothoracic Vascular Surgery) | Admitting: Thoracic Surgery (Cardiothoracic Vascular Surgery)

## 2014-03-13 DIAGNOSIS — I059 Rheumatic mitral valve disease, unspecified: Secondary | ICD-10-CM

## 2014-03-13 DIAGNOSIS — I7100 Dissection of unspecified site of aorta: Secondary | ICD-10-CM

## 2014-03-13 MED ORDER — IOHEXOL 350 MG/ML SOLN
80.0000 mL | Freq: Once | INTRAVENOUS | Status: AC | PRN
  Administered 2014-03-13: 80 mL via INTRAVENOUS

## 2014-03-15 ENCOUNTER — Other Ambulatory Visit: Payer: Self-pay | Admitting: Cardiology

## 2014-03-16 ENCOUNTER — Ambulatory Visit (INDEPENDENT_AMBULATORY_CARE_PROVIDER_SITE_OTHER): Payer: Federal, State, Local not specified - PPO | Admitting: Thoracic Surgery (Cardiothoracic Vascular Surgery)

## 2014-03-16 ENCOUNTER — Other Ambulatory Visit: Payer: Self-pay | Admitting: Cardiology

## 2014-03-16 ENCOUNTER — Encounter: Payer: Self-pay | Admitting: Thoracic Surgery (Cardiothoracic Vascular Surgery)

## 2014-03-16 ENCOUNTER — Other Ambulatory Visit: Payer: Self-pay | Admitting: Thoracic Surgery (Cardiothoracic Vascular Surgery)

## 2014-03-16 ENCOUNTER — Other Ambulatory Visit: Payer: Self-pay

## 2014-03-16 VITALS — BP 126/79 | HR 67 | Ht 76.0 in | Wt 197.0 lb

## 2014-03-16 DIAGNOSIS — I341 Nonrheumatic mitral (valve) prolapse: Secondary | ICD-10-CM

## 2014-03-16 DIAGNOSIS — I340 Nonrheumatic mitral (valve) insufficiency: Secondary | ICD-10-CM

## 2014-03-16 DIAGNOSIS — R911 Solitary pulmonary nodule: Secondary | ICD-10-CM

## 2014-03-16 DIAGNOSIS — I059 Rheumatic mitral valve disease, unspecified: Secondary | ICD-10-CM

## 2014-03-16 HISTORY — DX: Solitary pulmonary nodule: R91.1

## 2014-03-16 MED ORDER — AMIODARONE HCL 200 MG PO TABS: 200.0000 mg | ORAL_TABLET | Freq: Two times a day (BID) | ORAL | Status: DC

## 2014-03-16 NOTE — Progress Notes (Addendum)
[name]     CARDIOTHORACIC SURGERY OFFICE NOTE  Referring Provider is Leonie Man, MD PCP is Donnie Coffin, MD   HPI:  Patient returns for followup of stage C asymptomatic severe primary mitral regurgitation. He was originally seen in consultation on 03/03/2014. Since then he underwent left and right heart catheterization by Dr. Ellyn Hack on 03/05/2014.  Coronary arteriography was notable for 40% stenosis of the proximal left anterior descending coronary artery with otherwise widely patent stents in the proximal and midportion of the LAD. There was no other significant flow limiting coronary artery disease. Pulmonary artery pressures were mildly elevated and there were large V waves on wedge tracings consistent with severe mitral regurgitation.  Since then the patient underwent CT angiography of the aorta and its branches to establish whether or not he would be candidate for femoral cannulation for surgery. He returns to the office today to review the results of these tests with hopes to proceed with surgery later this month.  He reports no new problems or complaints.   Current Outpatient Prescriptions  Medication Sig Dispense Refill  . aspirin 81 MG tablet Take 81 mg by mouth daily.       . cholecalciferol (VITAMIN D) 1000 UNITS tablet Take 1,000 Units by mouth daily.      . clopidogrel (PLAVIX) 75 MG tablet Take 75 mg by mouth daily.      . Coenzyme Q10 (CO Q 10) 100 MG CAPS Take 1 capsule by mouth daily.      . Multiple Vitamins-Minerals (MULTIVITAMINS THER. W/MINERALS) TABS Take 1 tablet by mouth daily.      . Probiotic Product (PROBIOTIC DAILY PO) Take 1 tablet by mouth daily.      Marland Kitchen amiodarone (PACERONE) 200 MG tablet TAKE 1 TABLET BY MOUTH TWICE DAILY STARTING 7 DAYS PRIOR TO SURGERY  180 tablet  0  . losartan (COZAAR) 25 MG tablet TAKE 1 TABLET BY MOUTH EVERY DAY  90 tablet  5  . ondansetron (ZOFRAN  ODT) 8 MG disintegrating tablet Take 1 tablet (8 mg total) by mouth every 8 (eight) hours as needed for nausea or vomiting.  10 tablet  0   No current facility-administered medications for this visit.      Physical Exam:   BP 126/79  Pulse 67  Ht _0  (1.93 m)  Wt 197 lb (89.359 kg)  BMI 23.99 kg/m2  SpO2 97%  General:  Well-appearing  Chest:   Clear to auscultation  CV:   Regular rate and rhythm with prominent systolic murmur  Incisions:  n/a  Abdomen:  Soft and nontender  Extremities:  Warm and well-perfused  Diagnostic Tests:  CARDIAC CATHETERIZATION REPORT   NAME: Alan [name] MRN: [id number]  DOB: January 26, 1956 ADMIT DATE: 03/05/2014  Procedure Date: 03/05/2014  INTERVENTIONAL CARDIOLOGIST: Leonie Man, M.D., MS  PRIMARY CARE PROVIDER: Donnie Coffin, MD  PRIMARY CARDIOLOGIST: Leonie Man, MD, MS  PATIENT: Alan [name] is a 58 y.o. male with prior Anterior STEMI - PCI to LAD as well as Mitral Regurgitation / MVP. He has remained symptomatically stable, but surveillance Echocardiography identified significant progression of MR confirmed by TEE to be Severe MR. He has been referred to CT Surgery with planned MVR in August. He now presents for R&LHC.  PRE-OPERATIVE DIAGNOSIS:  CAD- s/p PCI to LAD (for Anterior STEMI)  Severe MR with MVP PROCEDURES PERFORMED:  Right & Left Heart Catheterization with Native Coronary Angiography via Right Radial Artery & Right Brachial Vein Access. PROCEDURE: The patient was brought to the [facility] in the fasting state and prepped and draped in the usual sterile fashion for Right radial & brahcial access. A modified Allen's test was performed on the Right wrist demonstrating excellent collateral flow. Sterile technique was used including antiseptics, cap, gloves, gown, hand hygiene, mask and sheet. Skin prep: Chlorhexidine.  Consent: Risks of procedure as well as the alternatives and risks of each  were explained to the (patient/caregiver). Consent for procedure obtained.  Time Out: Verified patient identification, verified procedure, site/side was marked, verified correct patient position, special equipment/implants available, medications/allergies/relevent history reviewed, required imaging and test results available. Performed.  Access:  Right Radial Artery: 6 Fr sheath -- Seldinger technique using Angiocath Micropuncture Kit  10 mL radial cocktail IA; 4500 Units IV Heparin  Right Brachial/Antecubital Vein: The existing 18-gauge IV was exchanged over a wire for a 5Fr short sheath --> after initial attempts to advance the Swan Ganz catheter were unsuccessful, this sheath was exchanged for a standard 5Fr glide sheath. Right Heart Catheterization: 5 Fr Gordy Councilman catheter advanced under fluoroscopy with balloon inflated to the RA, RV, then PCWP-PA for hemodynamic measurement.  Simultaneous FA & PA blood gases checked for SaO2% to calculate FICK CO/CI  Simultaneous PCWP/LV & RV/LV pressures monitored with Angled Pigtail in LV.  Catheter removed completely out of the body with balloon deflated.  Left Heart Catheterization: 5Fr Catheters advanced or exchanged over a J-wire; TIG 4.0 catheter advanced first.  LV Hemodynamics (LV Gram): Angled Pigtail  Left Coronary Artery Cineangiography: TIG 4.0 & EBU Guide Catheter  Right Coronary Artery Cineangiography: TIG 4.0 Catheter  Venous Sheath removed in the holding area with manual pressure for hemostasis.  TR band: 1020 hours; 13 mL he  EBL: < 10 ml, not including ABG and VBG samples  FINDINGS:  Hemodynamics:  Findings:   SaO2%  Pressures mmHg  Mean P  mmHg  EDP  mmHg   Right Atrium   8/6   6   Right Ventricle   45/7   17   Pulmonary Artery  65  41/17  27    PCWP  large V wave  24/35  21    Central Aortic  94  96/53  59    Left Ventricle   99/12   25          Cardiac Output:   Cardiac Index:    Fick  5.15   2.37    Left Ventriculography:  Deferred  Coronary Anatomy: Right dominant  Left Main: Large caliber vessel that trifurcates into the LAD, Circumflex and Ramus Intermedius. This is a short vessel but free of disease. LAD: A large caliber vessel with a proximal stent is widely patent. There does appear to be a step up and step down with the stent being larger diameter than the rest the vessel. There is about 30-40% stenosis just prior to the stent. The downstream LAD has a distal 40% stenosis near the apex.  D1: Small-caliber vessel it comes off from the stented segment. Minimal luminal irregularity. There is some mild ostial disease.  D2: Moderate caliber vessel with at least 4 branches covering the anterolateral wall. Angiographically normal. Left Circumflex: Moderate to large caliber vessel with mild little irregular it is. There is a small caliber mid OM branch as  well as another smaller OM branch for the vessel itself terminates as a large inferolateral OM/LPL with mild little irregularities.  Ramus intermedius: Small to moderate caliber vessel with proximal 40% tubular stenosis. The vessel only covers about two thirds of the anterolateral wall.  RCA: Large caliber, dominant vessel that bifurcates distally into the Right Posterior Descending Artery (RPDA) and the Right Posterior AV Groove Branch (RPAV). Just prior to bifurcation is roughly 40% stenosis.  RPDA: Moderate caliber vessel with a proximal roughly 40% stenosis  RPL Sysytem:The RPAV begins a large caliber vessel is of major illnesses. There is a smaller branch that has 40-50% proximal stenosis. MEDICATIONS:  Anesthesia: Local Lidocaine 3 ml Sedation: 2 mg IV Versed, 25 mcg IV fentanyl ; Omnipaque Contrast: 140 ml  Anticoagulation: IV Heparin 4500 Units Radial Cocktail: 5 mg Verapamil, 400 mcg NTG, 2 ml 2% Lidocaine in 10 ml NS  IV NTG 200 mcg x 1  500 mL NS Bolus  Short run of Dopamine infusion at 10 mcg/mn reduced to 0 prior to completion of the procedure. PATIENT  DISPOSITION:  The patient was transferred to the PACU holding area in a hemodynamicaly stable, chest pain free condition.  The patient tolerated the procedure well, and there were no complications. He did get hypotensive with mild bradycardia in high 40s  The patient was stable before, during, and after the procedure. POST-OPERATIVE DIAGNOSIS:  Moderate ~40% pre-stent stenosis in proximal LAD otherwise minimal CAD  Mildly elevated pulmonary pressures with normal Cardiac Output & Index by FICK.  Large V wave on PCWP waveform confirming Severe MR. PLAN OF CARE:  Standard post Radial cath care - will monitor an additional hour to ensure BP remains stable.  Discharge post bedrest  Plan to proceed with MVR via L thoracotomy minimally invasive approach, will need to stop Plavix 5-7 days pre-op.  F/u with Dr. Ellyn Hack post -op. Leonie Man, M.D., M.S.  [facility]  [address] [place], [place] [address]  [phone]  03/05/2014  10:25 AM   CT ANGIOGRAPHY CHEST, ABDOMEN AND PELVIS  TECHNIQUE:  Multidetector CT imaging through the chest, abdomen and pelvis was  performed using the standard protocol during bolus administration of  intravenous contrast. Multiplanar reconstructed images and MIPs were  obtained and reviewed to evaluate the vascular anatomy.  CONTRAST: 62m OMNIPAQUE IOHEXOL 350 MG/ML SOLN  COMPARISON: None.  FINDINGS:  CTA CHEST FINDINGS  VASCULAR  Heart/Vascular: Left atrial dilatation concerning for mitral valve  insufficiency. Probable mild left ventricular concentric  hypertrophy. Atherosclerotic calcifications present within the  coronary arteries. There appears to be a stent in the proximal left  anterior descending coronary artery. No pericardial effusion.  Limited evaluation of the mitral valve secondary to non cardiac  gated technique. Conventional 3 vessel arch anatomy. Trace  atherosclerotic calcifications without evidence  of dissection,  aneurysmal dilatation or significant stenosis. Normal size main and  central pulmonary arteries.  Review of the MIP images confirms the above findings.  NON VASCULAR  Mediastinum: Unremarkable appearance of the thyroid gland.  Nonspecific low-attenuation mediastinal adenopathy. A right  paratracheal lymph node measures 1.2 cm in short axis while a low  right paratracheal node measures 1.4 cm in short axis. Additionally,  there are calcified right hilar nodes which are not enlarged by CT  criteria. Unremarkable thoracic esophagus.  Lungs/Pleura: No pleural effusion. Nonspecific focus of ground-glass  attenuation nodular opacity in the right middle lobe measures 1.2 x  1.0 cm on  image 46 of series 5. The solid component measures  approximately 5 mm. Linear atelectasis versus scarring present  within the right lower lobe. Trace dependent atelectasis posteriorly  in the lower lobes. No focal airspace consolidation, evidence of  pulmonary edema or emphysema.  Bones/Soft Tissues: No acute fracture or aggressive appearing lytic  or blastic osseous lesion.  CTA ABDOMEN AND PELVIS FINDINGS  VASCULAR  Aorta: Normal caliber aorta with out evidence of aneurysm,  dissection or significant atherosclerotic plaque.  Celiac: Widely patent. Conventional hepatic arterial anatomy.  SMA: Widely patent.  Renals: Two co-dominant right renal arteries which are widely patent  and demonstrate no evidence of fibromuscular dysplasia. On the left,  there is a single renal artery with trace atherosclerotic  calcification at the origin but no evidence of a stenosis. No  changes of fibromuscular dysplasia.  IMA: Widely patent and unremarkable.  Inflow: Mildly tortuous common iliac arteries. No evidence of  atherosclerotic plaque, aneurysm, dissection or other abnormality.  The bilateral internal and external iliac arteries are also  unremarkable.  Proximal Outflow: Unremarkable appearance of the  bilateral common,  superficial and profunda femoral arteries. Single metallic artifact  identified over lying in the right common femoral artery. Probable  StarClose extra arterial closure device.  Veins: Given limitations of non venous phase timing, no focal  abnormality identified.  Review of the MIP images confirms the above findings.  NON-VASCULAR  Abdomen: Unremarkable CT appearance of the stomach, duodenum,  spleen, adrenal glands and pancreas. Normal hepatic contour and  morphology. No suspicious hepatic lesion. 9 mm focus with peripheral  incomplete nodular enhancement in the right hemi liver (image 131  series 4) is incompletely evaluated without delayed imaging but  highly likely a benign hemangioma. Gallbladder is unremarkable. No  intra or extrahepatic biliary ductal dilatation. Unremarkable  appearance of the bilateral kidneys. No focal solid lesion,  hydronephrosis or nephrolithiasis.  Colonic diverticular disease without CT evidence of active  inflammation. No evidence of obstruction or focal bowel wall  thickening. Normal appendix in the right lower quadrant. The  terminal ileum is unremarkable. No free fluid or suspicious  adenopathy.  Pelvis: Unremarkable bladder, prostate gland and seminal vesicles.  No free fluid or suspicious adenopathy.  Bones/Soft Tissues: No acute fracture or aggressive appearing lytic  or blastic osseous lesion. Focal L2-L3 degenerative disc disease.  IMPRESSION:  CTA CHEST  1. Suspicious 1.2 x 1.0 cm semi-solid (central solid component  measures 5 mm) ground-glass attenuation nodular opacity within the  right middle lobe. Differential considerations include a focus of  active infection/inflammation, parenchymal scarring from a remote  infectious/inflammatory process and primary bronchogenic  adenocarcinoma. Initial follow-up by chest CT without contrast is  recommended in 3 months to confirm persistence. This recommendation  follows the  consensus statement: Recommendations for the Management  of Subsolid Pulmonary Nodules Detected at CT: A Statement from the  [place] as published in Radiology 2013; 266:304-317.  2. Nonspecific, low-attenuation mediastinal lymphadenopathy.  Recommend attention on three-month follow-up imaging.  3. Focal left atrial and left atrial appendage dilatation consistent  with the clinical history of mitral valve insufficiency.  4. Borderline concentric hypertrophy of the left ventricular  myocardium. Query clinical history of systemic arterial  hypertension?  5. Atherosclerosis including coronary artery disease. There appears  to be a metallic stent in the left anterior descending coronary  artery.  6. Unremarkable appearance of the thoracic aorta.  CTA ABD/PELVIS  1. Essentially unremarkable appearance of the abdominal aorta and  branch vessels for age as detailed above.  2. Access vessels are widely patent, minimally tortuous and  essentially plaque free.  3. Small metallic clip overlying the right common femoral artery  likely represents a StarClose extra arterial closure device.  These results will be called to the ordering clinician or  representative by the Radiologist Assistant, and communication  documented in the PACS or zVision Dashboard.  Signed,  Criselda Peaches, MD  [facility]  [facility]  Electronically Signed  By: Jacqulynn Cadet M.D.  On: 03/13/2014 14:10    Impression:  The patient has stage C asymptomatic severe primary mitral regurgitation. I have personally reviewed the patient's recent transthoracic and transesophageal echocardiograms. He has mitral valve prolapse with a large flail segment of the middle scallop (P2) of the posterior leaflet with severe (4+) mitral regurgitation. Left ventricular systolic function is preserved.  Cardiac catheterization demonstrates 40% proximal stenosis of the left  anterior descending coronary artery was otherwise widely patent stents in the left anterior descending coronary artery and no other significant flow limiting coronary artery disease. CT angiography is notable for the absence of significant aortoiliac occlusive disease, and as such the patient appears to be a relatively good candidate for a minimally invasive approach for surgery.  However, the CT scan also reveals a small "suspicious" groundglass opacity deep within the parenchyma of the right middle lobe that requires followup. The patient has no history of tobacco use and would otherwise be at relatively low risk for primary lung cancer.  Unfortunately, the location of this nodule is deep within the parenchyma of the right middle lobe and therefore not be easy to biopsy at the time of right mini thoracotomy for minimally invasive mitral valve repair.  Moreover, postoperatively it is very possible that the radiographic appearance of the entire right middle lobe may be at least temporarily altered by the right mini thoracotomy approach for mitral valve repair.     Plan:  I discussed options at length with the patient and his wife in the office today. I explained the significance of the incidental lung nodule discovered on CT scan. Although the likelihood that this could be an invasive lung cancer is probably quite small, it definitely requires followup.  Under the circumstances I would favor postponing surgery for approximately 3 months and getting a second repeat chest CT scan to ensure stability or resolution of this lesion.  The rationale for this recommendation was discussed at length. However, the patient is very anxious about his mitral valve surgery, and at this time he insists on proceeding with mitral valve repair as soon as practical. Both he and his wife understand that he might develop significant scar tissue or adhesions around the right middle lobe which could affect either the radiographic  appearance of the right middle lobe lesion or the risks associated with surgical resection in the future.  Following a decision to proceed with surgery in the immediate future, I again reviewed the indications, risks, and potential benefits of mitral valve repair with the patient and his wife.  They understand and accept all potential risks of surgery including but not limited to risk of death, stroke or other neurologic complication, myocardial infarction, congestive heart failure, respiratory failure, renal failure, bleeding requiring transfusion and/or reexploration, arrhythmia, infection or other wound complications, pneumonia, pleural and/or pericardial effusion, pulmonary embolus, aortic dissection or other major vascular complication, or delayed complications related to valve repair or replacement including but not limited to structural valve  deterioration and failure, thrombosis, embolization, endocarditis, or paravalvular leak.  The patient will return for followup on Monday, 04/06/2014 prior to surgery on Wednesday, 04/08/2014.  He will stop taking Plavix 7 days before his operation but continued taking aspirin.  He has been given a prescription for amiodarone to begin 7 days prior to surgery to decrease his risk of perioperative atrial dysrhythmias.  All of their questions have been answered.      I spent in excess of 45 minutes during the conduct of this office consultation and >50% of this time involved direct face-to-face encounter with the patient for counseling and/or coordination of their care.   Valentina Gu. Roxy Manns, MD 03/16/2014 6:28 PM

## 2014-03-16 NOTE — Telephone Encounter (Signed)
Rx refill sent to patient pharmacy   

## 2014-03-16 NOTE — Patient Instructions (Signed)
Stop taking Plavix and begin taking Amiodarone 7 days before your surgery

## 2014-03-24 ENCOUNTER — Encounter: Payer: Self-pay | Admitting: *Deleted

## 2014-03-30 ENCOUNTER — Other Ambulatory Visit (HOSPITAL_COMMUNITY): Payer: Federal, State, Local not specified - PPO

## 2014-03-30 ENCOUNTER — Ambulatory Visit (HOSPITAL_COMMUNITY): Payer: Federal, State, Local not specified - PPO

## 2014-03-30 ENCOUNTER — Encounter (HOSPITAL_COMMUNITY): Payer: Federal, State, Local not specified - PPO

## 2014-04-01 ENCOUNTER — Encounter (HOSPITAL_COMMUNITY): Payer: Self-pay | Admitting: Pharmacy Technician

## 2014-04-04 NOTE — Pre-Procedure Instructions (Signed)
ODARIUS DINES  04/04/2014   Your procedure is scheduled on:  August 26  Report to [facility] Admitting at 06:30 AM.  Call this number if you have problems the morning of surgery: [phone]   Remember:   Do not eat food or drink liquids after midnight.   Take these medicines the morning of surgery with A SIP OF WATER: Amiodarone,    STOP Vitamin D, CoQ10, Plavix, Multiple Vitamins, Probiotic today   STOP/ Do not take Aleve, Naproxen, Advil, Ibuprofen, Motrin, Vitamins, Herbs, or Supplements starting today   Do not wear jewelry, make-up or nail polish.  Do not wear lotions, powders, or perfumes. You may wear deodorant.  Do not shave 48 hours prior to surgery. Men may shave face and neck.  Do not bring valuables to the hospital.  [facility] is not responsible for any belongings or valuables.               Contacts, dentures or bridgework may not be worn into surgery.  Leave suitcase in the car. After surgery it may be brought to your room.  For patients admitted to the hospital, discharge time is determined by your treatment team.               Special Instructions: See [facility] Preparing For Surgery   Please read over the following fact sheets that you were given: Pain Booklet, Coughing and Deep Breathing, Blood Transfusion Information and Surgical Site Infection Prevention

## 2014-04-06 ENCOUNTER — Encounter (HOSPITAL_COMMUNITY)
Admission: RE | Admit: 2014-04-06 | Discharge: 2014-04-06 | Disposition: A | Discharge status: Home / Self Care | Payer: Federal, State, Local not specified - PPO | Source: Ambulatory Visit | Attending: Thoracic Surgery (Cardiothoracic Vascular Surgery) | Admitting: Thoracic Surgery (Cardiothoracic Vascular Surgery)

## 2014-04-06 ENCOUNTER — Encounter (HOSPITAL_COMMUNITY): Payer: Self-pay

## 2014-04-06 ENCOUNTER — Ambulatory Visit (INDEPENDENT_AMBULATORY_CARE_PROVIDER_SITE_OTHER): Payer: Federal, State, Local not specified - PPO | Admitting: Thoracic Surgery (Cardiothoracic Vascular Surgery)

## 2014-04-06 ENCOUNTER — Ambulatory Visit (HOSPITAL_COMMUNITY)
Admission: RE | Admit: 2014-04-06 | Discharge: 2014-04-06 | Disposition: A | Discharge status: Home / Self Care | Payer: Federal, State, Local not specified - PPO | Source: Ambulatory Visit | Attending: Thoracic Surgery (Cardiothoracic Vascular Surgery) | Admitting: Thoracic Surgery (Cardiothoracic Vascular Surgery)

## 2014-04-06 ENCOUNTER — Encounter: Payer: Self-pay | Admitting: Thoracic Surgery (Cardiothoracic Vascular Surgery)

## 2014-04-06 VITALS — BP 128/78 | HR 71 | Ht 76.0 in | Wt 193.0 lb

## 2014-04-06 DIAGNOSIS — I340 Nonrheumatic mitral (valve) insufficiency: Secondary | ICD-10-CM

## 2014-04-06 DIAGNOSIS — I059 Rheumatic mitral valve disease, unspecified: Secondary | ICD-10-CM | POA: Diagnosis present

## 2014-04-06 DIAGNOSIS — I341 Nonrheumatic mitral (valve) prolapse: Secondary | ICD-10-CM

## 2014-04-06 DIAGNOSIS — Z01810 Encounter for preprocedural cardiovascular examination: Secondary | ICD-10-CM

## 2014-04-06 HISTORY — DX: Other specified postprocedural states: R11.2

## 2014-04-06 HISTORY — DX: Other specified postprocedural states: Z98.890

## 2014-04-06 LAB — PULMONARY FUNCTION TEST
DL/VA % PRED: 83 %
DL/VA: 4.12 ml/min/mmHg/L
DLCO UNC: 42.68 ml/min/mmHg
DLCO cor % pred: 105 %
DLCO cor: 42.68 ml/min/mmHg
DLCO unc % pred: 105 %
FEF 25-75 Pre: 2.44 L/sec
FEF2575-%Pred-Pre: 66 %
FEV1-%Pred-Pre: 82 %
FEV1-PRE: 3.7 L
FEV1FVC-%Pred-Pre: 91 %
FEV6-%PRED-PRE: 92 %
FEV6-Pre: 5.24 L
FEV6FVC-%Pred-Pre: 102 %
FVC-%PRED-PRE: 90 %
FVC-Pre: 5.34 L
Pre FEV1/FVC ratio: 69 %
Pre FEV6/FVC Ratio: 98 %
RV % PRED: 100 %
RV: 2.53 L
TLC % pred: 107 %
TLC: 8.8 L

## 2014-04-06 LAB — CBC
HEMATOCRIT: 38.7 % — AB (ref 39.0–52.0)
Hemoglobin: 13 g/dL (ref 13.0–17.0)
MCH: 30.5 pg (ref 26.0–34.0)
MCHC: 33.6 g/dL (ref 30.0–36.0)
MCV: 90.8 fL (ref 78.0–100.0)
Platelets: 154 10*3/uL (ref 150–400)
RBC: 4.26 MIL/uL (ref 4.22–5.81)
RDW: 12.4 % (ref 11.5–15.5)
WBC: 4.6 10*3/uL (ref 4.0–10.5)

## 2014-04-06 LAB — HEMOGLOBIN A1C
HEMOGLOBIN A1C: 5.4 % (ref <5.7)
Mean Plasma Glucose: 108 mg/dL (ref <117)

## 2014-04-06 LAB — COMPREHENSIVE METABOLIC PANEL
ALK PHOS: 70 U/L (ref 39–117)
ALT: 23 U/L (ref 0–53)
ANION GAP: 14 (ref 5–15)
AST: 29 U/L (ref 0–37)
Albumin: 4.1 g/dL (ref 3.5–5.2)
BUN: 16 mg/dL (ref 6–23)
CHLORIDE: 105 meq/L (ref 96–112)
CO2: 20 mEq/L (ref 19–32)
CREATININE: 0.7 mg/dL (ref 0.50–1.35)
Calcium: 9.3 mg/dL (ref 8.4–10.5)
GFR calc Af Amer: >90 mL/min (ref >90)
Glucose, Bld: 94 mg/dL (ref 70–99)
POTASSIUM: 4 meq/L (ref 3.7–5.3)
Sodium: 139 mEq/L (ref 137–147)
Total Bilirubin: 1.1 mg/dL (ref 0.3–1.2)
Total Protein: 6.8 g/dL (ref 6.0–8.3)

## 2014-04-06 LAB — BLOOD GAS, ARTERIAL
Acid-base deficit: 1.1 mmol/L (ref 0.0–2.0)
Bicarbonate: 22.5 mEq/L (ref 20.0–24.0)
Drawn by: 206361
FIO2: 0.21 %
O2 Saturation: 97.8 %
PCO2 ART: 34 mmHg — AB (ref 35.0–45.0)
PO2 ART: 95.5 mmHg (ref 80.0–100.0)
Patient temperature: 98.6
TCO2: 23.5 mmol/L (ref 0–100)
pH, Arterial: 7.436 (ref 7.350–7.450)

## 2014-04-06 LAB — URINALYSIS, ROUTINE W REFLEX MICROSCOPIC
Bilirubin Urine: NEGATIVE
GLUCOSE, UA: NEGATIVE mg/dL
Hgb urine dipstick: NEGATIVE
Ketones, ur: NEGATIVE mg/dL
LEUKOCYTES UA: NEGATIVE
NITRITE: NEGATIVE
PROTEIN: NEGATIVE mg/dL
Specific Gravity, Urine: 1.018 (ref 1.005–1.030)
Urobilinogen, UA: 0.2 mg/dL (ref 0.0–1.0)
pH: 7.5 (ref 5.0–8.0)

## 2014-04-06 LAB — SURGICAL PCR SCREEN
MRSA, PCR: NEGATIVE
Staphylococcus aureus: POSITIVE — AB

## 2014-04-06 LAB — TYPE AND SCREEN
ABO/RH(D): O POS
Antibody Screen: NEGATIVE

## 2014-04-06 LAB — APTT: aPTT: 32 seconds (ref 24–37)

## 2014-04-06 LAB — PROTIME-INR
INR: 1.01 (ref 0.00–1.49)
Prothrombin Time: 13.3 seconds (ref 11.6–15.2)

## 2014-04-06 LAB — ABO/RH: ABO/RH(D): O POS

## 2014-04-06 MED ORDER — ALPRAZOLAM 0.5 MG PO TABS: 0.5000 mg | ORAL_TABLET | Freq: Every evening | ORAL | Status: DC | PRN

## 2014-04-06 NOTE — H&P (Addendum)
     [address]       [place] [address]             [phone]          CARDIOTHORACIC SURGERY HISTORY AND PHYSICAL EXAM  Referring Provider is Harding, David W, MD PCP is MITCHELL,DEAN, MD or GRIFFIN, JOHN, MD    Chief Complaint   Patient presents with   .  Mitral Regurgitation     HPI:  Patient is a 58-year-old male from [place] with history of coronary artery disease and mitral valve prolapse with mitral regurgitation who has been referred for possible elective mitral valve repair.  The patient's cardiac history dates back to 2004 when he presented with an acute ST segment elevation myocardial infarction involving the anterior wall which was treated with PCI and stenting with overlapping drug-eluting stent things in the left anterior descending coronary artery. The patient recovered uneventfully. At the time he was noted to have a heart murmur on exam, and echocardiograms revealed the presence of mitral valve prolapse with mild mitral regurgitation. He has been followed carefully ever since by a variety different cardiologist, most recently by Dr. Harding. Recent followup transthoracic echocardiogram suggested significant increase in severity of mitral regurgitation. This prompted transesophageal echocardiogram which was performed 02/06/2014 and confirmed the presence of a large flail segment of the posterior leaflet with severe (4+) mitral regurgitation. Left ventricular systolic function was mildly reduced with ejection fraction estimated 55-60%. The patient was referred for elective surgical consultation.  He was originally seen in consultation on 03/03/2014. Since then he underwent left and right heart catheterization by Dr. Harding on 03/05/2014.  Coronary arteriography was notable for 40% stenosis of the proximal left anterior descending coronary artery with otherwise widely patent stents in the proximal and midportion of the LAD. There was no other significant  flow limiting coronary artery disease. Pulmonary artery pressures were mildly elevated and there were large V waves on wedge tracings consistent with severe mitral regurgitation.  Since then the patient underwent CT angiography of the aorta and its branches to establish whether or not he would be candidate for femoral cannulation for surgery.    The patient is married and lives in [place] with his wife. 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. He lives a reasonably active physical lifestyle without any particular limitations. He specifically denies any symptoms of exertional shortness of breath. He does admit to some exertional fatigue and occasional tachypalpitations. He denies any history of exertional chest discomfort. He specifically denies any history of resting shortness of breath, PND, orthopnea, or lower extremity edema.   Past Medical History  Diagnosis Date  . ST elevation myocardial infarction (STEMI) of anterior wall, subsequent episode of care 2004    he had a proximal LAD occlusion treated with 2 overlapping 3.5 x 1.8 mm Cypher DES stents.  . CAD S/P percutaneous coronary angioplasty 2004    which showed essentially normal LV size and function, moderately dilated left atrium, moderate mitral prolapse with mild to moderate regurgitation.  . History of stress test 12/2009    he walked 9 mins reaching 10 METS. There was an attenuation artifact in the inferior region but no ischemia or infaract, low risk.  . H/O echocardiogram 09/2011    which showed essentially normal LV size and function, moderately dilated left atrium, moderate mitral prolapse with mild to moderate regurgitation.  . Mitral valve prolapse     Moderate - with   moderate MR, noted February t 2013  . Severe mitral regurgitation by prior echocardiogram 02/06/2014    TEE: Severe mitral regurgitation with a flail P2 segment and ruptured; Normal LV size & function, dilated LA.  Marland Kitchen Hypertension   .  Dyslipidemia, goal LDL below 70   . GERD (gastroesophageal reflux disease)   . Incidental lung nodule, greater than or equal to 28m 03/16/2014    Ground glass opacity RML noted on CT scan  . PONV (postoperative nausea and vomiting)     Past Surgical History  Procedure Laterality Date  . Cardiac catheterization  2004    he had a proximal LAD occlusion treated with 2 overlapping 3.5 x 1.8 mm Cypher DES stents.  . Cardiac catheterization  2007    where he had widely patent LAD stents, 40% dostal LAD, 30% circumflex and 50-60% inferior bifurcation lesion in the PL with mild anteroapical hypokinesis.  .Marland KitchenAnterior cruciate ligament repair  1993  . Nm myoview ltd  May 2011    Walk 9 minutes, and 10 METs, diaphragmatic attenuation but no ischemia or infarction.  . Transthoracic echocardiogram  February 2013    Mild L. the dilation (likely normal for size) normal function greater than 55% EF. Moderate LA dilation. Moderate mitral prolapse of posterior leaflet with mild to moderate mitral regurgitation. -- No significant change since 2010  . Tee without cardioversion N/A 02/06/2014    Procedure: TRANSESOPHAGEAL ECHOCARDIOGRAM (TEE);  Surgeon: KPixie Casino MD;  Normal LV Size U& function - EF 55-60%, no regional WMA.  MV P2 Leaflet is flail with ruptured chord with severe prolapse, anterior leaflet intact.  Severe, eccentric anterior directed MR with dilated LA.  . Colonoscopy      Family History  Problem Relation Age of Onset  . Hypertension Mother   . Heart Problems Father     triple bypass 1989  . Cancer Paternal Grandmother     Pancreatic    Social History History  Substance Use Topics  . Smoking status: Never Smoker   . Smokeless tobacco: Not on file  . Alcohol Use: Yes     Comment: social    Prior to Admission medications   Medication Sig Start Date End Date Taking? Authorizing Provider  amiodarone (PACERONE) 200 MG tablet Take 200 mg by mouth 2 (two) times daily. For 7 days  04/01/14  Yes Historical Provider, MD  aspirin 81 MG tablet Take 81 mg by mouth daily.    Yes Historical Provider, MD  cholecalciferol (VITAMIN D) 1000 UNITS tablet Take 1,000 Units by mouth daily.   Yes Historical Provider, MD  clopidogrel (PLAVIX) 75 MG tablet Take 75 mg by mouth daily.   Yes Historical Provider, MD  Coenzyme Q10 (CO Q 10) 100 MG CAPS Take 100 mg by mouth daily.    Yes Historical Provider, MD  losartan (COZAAR) 25 MG tablet Take 25 mg by mouth daily.   Yes Historical Provider, MD  Multiple Vitamins-Minerals (MULTIVITAMINS THER. W/MINERALS) TABS Take 1 tablet by mouth daily.   Yes Historical Provider, MD  Probiotic Product (PROBIOTIC DAILY PO) Take 1 tablet by mouth daily.   Yes Historical Provider, MD  ALPRAZolam (Duanne Moron 0.5 MG tablet Take 1 tablet (0.5 mg total) by mouth at bedtime as needed for anxiety or sleep. 04/06/14   CRexene Alberts MD    Allergies  Allergen Reactions  . Amoxicillin Hives  . Other Hives    "CILLINS"  . Penicillins Hives     Review of  Systems:              General:                      normal appetite, decreased energy, no weight gain, no weight loss, no fever             Cardiac:                      no chest pain with exertion, no chest pain at rest, no SOB with exertion, no resting SOB, no PND, no orthopnea, + palpitations, no arrhythmia, no atrial fibrillation, no LE edema, no dizzy spells, no syncope             Respiratory:                no shortness of breath, no home oxygen, no productive cough, no dry cough, no bronchitis, no wheezing, no hemoptysis, no asthma, no pain with inspiration or cough, no sleep apnea, no CPAP at night             GI:                                no difficulty swallowing, no reflux, no frequent heartburn, no hiatal hernia, no abdominal pain, no constipation, no diarrhea, no hematochezia, no hematemesis, no melena             GU:                              no dysuria,  no frequency, no urinary tract infection,  no hematuria, no enlarged prostate, no kidney stones, no kidney disease             Vascular:                     no pain suggestive of claudication, no pain in feet, no leg cramps, no varicose veins, no DVT, no non-healing foot ulcer             Neuro:                         no stroke, no TIA's, no seizures, no headaches, no temporary blindness one eye,  no slurred speech, no peripheral neuropathy, no chronic pain, no instability of gait, no memory/cognitive dysfunction             Musculoskeletal:         mild arthritis in knees, no joint swelling, no myalgias, no difficulty walking, normal mobility               Skin:                            no rash, no itching, no skin infections, no pressure sores or ulcerations             Psych:                         no anxiety, no depression, no nervousness, no unusual recent stress             Eyes:  no blurry vision, + floaters, no recent vision changes, does not wear glasses or contacts             ENT:                            no hearing loss, no loose or painful teeth, no dentures, last saw dentist within the past year             Hematologic:               + easy bruising, no abnormal bleeding, no clotting disorder, no frequent epistaxis             Endocrine:                   no diabetes, does not check CBG's at home                           Physical Exam:              BP 111/74  Pulse 92  Resp 20  Ht 6' 4" (1.93 m)  Wt 190 lb (86.183 kg)  BMI 23.14 kg/m2  SpO2 96%             General:                        well-appearing             HEENT:                       Unremarkable               Neck:                           no JVD, no bruits, no adenopathy               Chest:                         clear to auscultation, symmetrical breath sounds, no wheezes, no rhonchi               CV:                              RRR, grade IV/VI holosystolic murmur heard all across precordium w/ radiation to back              Abdomen:                    soft, non-tender, no masses               Extremities:                 warm, well-perfused, pulses palpable, no LE edema             Rectal/GU                   Deferred             Neuro:                         Grossly non-focal and symmetrical throughout               Skin:                            Clean and dry, no rashes, no breakdown   Diagnostic Tests:  Transthoracic Echocardiography  Patient:    Gilad, Dugger MR #:       [id number] Study Date: 12/26/2013 Gender:     M Age:        68 Height:     193cm Weight:     87.1kg BSA:        2.84m2 Pt. Status: Room:    ATTENDING    HWynetta Fines PERFORMING   [facility], Outpatient  SONOGRAPHER  [facility] cc:  ------------------------------------------------------------ LV EF: 60% -   65%  ------------------------------------------------------------ Indications:      424.0 Mitral valve disease.  ------------------------------------------------------------ History:   PMH:  STEMI (2000)  Coronary artery disease. Mitral valve prolapse.  Risk factors:  Family history of coronary artery disease. Hypertension. Dyslipidemia.  ------------------------------------------------------------ Study Conclusions  - Left ventricle: The cavity size was normal. There was mild   focal basal hypertrophy of the septum. Systolic function   was normal. The estimated ejection fraction was in the   range of 60% to 65%. Wall motion was normal; there were no   regional wall motion abnormalities. Features are   consistent with a pseudonormal left ventricular filling   pattern, with concomitant abnormal relaxation and   increased filling pressure (grade 2 diastolic   dysfunction). - Mitral valve: Calcified annulus. Severe prolapse,   involving the posterior leaflet. Severe regurgitation   directed eccentrically and  anteriorly. - Left atrium: The atrium was moderately dilated. - Pulmonary arteries: Systolic pressure was mildly   increased. Impressions:  - Normal LV function; severe prolapse of posterior MV   leaflet; eccentric, anteriorly directed MR difficult to   quantitate but most likely severe; suggest TEE to further   assess if clinically indicated. Transthoracic echocardiography.  M-mode, complete 2D, spectral Doppler, and color Doppler.  Height:  Height: 193cm. Height: 76in.  Weight:  Weight: 87.1kg. Weight: 191.6lb.  Body mass index:  BMI: 23.4kg/m^2.  Body surface area:    BSA: 2.162m.  Blood pressure:     110/62.  Patient status:  Outpatient.  Location:  Echo laboratory.  ------------------------------------------------------------  ------------------------------------------------------------ Left ventricle:  The cavity size was normal. There was mild focal basal hypertrophy of the septum. Systolic function was normal. The estimated ejection fraction was in the range of 60% to 65%. Wall motion was normal; there were no regional wall motion abnormalities. Features are consistent with a pseudonormal left ventricular filling pattern, with concomitant abnormal relaxation and increased filling pressure (grade 2 diastolic dysfunction).  ------------------------------------------------------------ Aortic valve:   Trileaflet; normal thickness leaflets. Mobility was not restricted.  Doppler:  Transvalvular velocity was within the normal range. There was no stenosis.  No regurgitation.    Mean gradient: 111mg (S). Peak gradient: 73m71m (S).  ------------------------------------------------------------ Aorta:  Aortic root: The aortic root was normal in size.  ------------------------------------------------------------ Mitral valve:   Calcified annulus. Mobility was not restricted.  Severe prolapse, involving the posterior leaflet.  Doppler:  Transvalvular velocity was within  the normal range. There was no evidence for stenosis.  Severe regurgitation directed eccentrically and anteriorly.    Peak gradient: 9mm 39m(D).  ------------------------------------------------------------ Left atrium:  LA Volume/BSA= 53.2 ml/m2 The atrium was moderately dilated.  ------------------------------------------------------------ Right ventricle:  The cavity size was normal. Systolic function was normal.  ------------------------------------------------------------ Pulmonic valve:    Doppler:  Transvalvular velocity was within the normal range. There was no evidence for stenosis.  Trivial regurgitation.  ------------------------------------------------------------ Tricuspid valve:   Structurally normal valve.    Doppler: Transvalvular velocity was within the normal range.  Mild regurgitation.  ------------------------------------------------------------ Pulmonary artery:   Systolic pressure was mildly increased.   ------------------------------------------------------------ Right atrium:  The atrium was normal in size.  ------------------------------------------------------------ Pericardium:  There was no pericardial effusion.  ------------------------------------------------------------ Systemic veins: Inferior vena cava: The vessel was normal in size; the respirophasic diameter changes were in the normal range (= 50%); findings are consistent with normal central venous pressure. Diameter: 13.2mm.  ------------------------------------------------------------  2D measurements        Normal  Doppler               Normal IVC                            measurements Diam       13.2 mm     ------  Left ventricle Left ventricle                 Ea, lat      10.8 cm/ ------- LVID ED,   56.2 mm     43-52   ann, tiss         s chord,                         DP PLAX                           E/Ea, lat   14.26     ------- LVID ES,   32.4 mm     23-38   ann,  tiss chord,                         DP PLAX                           Ea, med      12.2 cm/ ------- FS, chord,   42 %      >29     ann, tiss         s PLAX                           DP LVPW, ED   9.55 mm     ------  E/Ea, med   12.62     ------- IVS/LVPW    1.3        <1.3    ann, tiss ratio, ED                      DP Ventricular septum             Aortic valve IVS, ED    12.4 mm     ------  Peak vel, S   186 cm/ ------- Aorta                                              s Root diam,   30 mm     ------  Mean vel, S   148 cm/ ------- ED                                               s Left atrium                    VTI, S       37.8 cm  ------- AP dim       43 mm     ------  Mean           11 mm  ------- AP dim     1.97 cm/m^2 <2.2    gradient, S       Hg index                          Peak           14 mm  -------                                gradient, S       Hg                                Mitral valve                                Peak E vel    154 cm/ -------                                                  s                                Peak A vel   90.5 cm/ -------                                                  s                                Deceleratio   275 ms  150-230                                n time                                Peak            9 mm  -------                                gradient, D  Hg                                Peak E/A      1.7     -------                                ratio                                Tricuspid valve                                Regurg peak   278 cm/ -------                                vel               s                                Peak RV-RA     31 mm  -------                                gradient, S       Hg                                Right ventricle                                Sa vel, lat    16 cm/ -------                                ann, tiss         s                                 DP   ------------------------------------------------------------ Prepared and Electronically Authenticated by  Crenshaw, Brian 2015-05-15T12:10:51.130    Transesophageal Echocardiography  Patient:    Baumgarten, Jhalil H MR #:       [id number] Study Date: 02/06/2014 Gender:     M Age:        58 Height:     193 cm Weight:     88.2 kg BSA:        2.17 m^2 Pt. Status: Room:   SONOGRAPHER  Rich Lombardo, RDCS  ADMITTING    Kenneth Hilty MD  ATTENDING    Kenneth Hilty MD  PERFORMING   Kenneth Hilty MD  ORDERING     David Harding  REFERRING    David Harding  cc:  ------------------------------------------------------------------- LV EF: 55% -   60%  ------------------------------------------------------------------- Indications:      Mitral regurgitation 424.0.  ------------------------------------------------------------------- Study Conclusions  - Left ventricle: The cavity size was normal. Wall thickness was   normal. Systolic function was normal. The estimated ejection   fraction was   in the range of 55% to 60%. Wall motion was normal;   there were no regional wall motion abnormalities. - Aortic valve: No evidence of vegetation. There was trivial   regurgitation. - Mitral valve: There is a flail P2 segment of the posterior mitral   leaflet with ruptured cord that is seen prolapsing into the   atrium past the valve plane. The anterior leaflet appears intact.   There is severe, eccentric, anteriorly/septally directed mitral   regurgitation. - Left atrium: The atrium was dilated. No evidence of thrombus in   the atrial cavity or appendage. - Right atrium: No evidence of thrombus in the atrial cavity or   appendage. - Atrial septum: No defect or patent foramen ovale was identified. - Tricuspid valve: No evidence of vegetation. - Pulmonic valve: No evidence of vegetation.  Impressions:  - Severe mitral regurgitation with a flail P2 segment and ruptured    cord.  Diagnostic transesophageal echocardiography.  2D and color Doppler.  Birthdate:  Patient birthdate: 08/05/1956.  Age:  Patient is 58 yr old.  Sex:  Gender: male.  Height:  Height: 193 cm. Height: 76 in. Weight:  Weight: 88.2 kg. Weight: 194 lb.  Body mass index:  BMI: 23.7 kg/m^2.  Body surface area:    BSA: 2.17 m^2.  Blood pressure:     139/90  Patient status:  Outpatient.  Study date:  Study date: 02/06/2014. Study time: 10:07 AM.  Location:  Endoscopy.  -------------------------------------------------------------------  ------------------------------------------------------------------- Left ventricle:  The cavity size was normal. Wall thickness was normal. Systolic function was normal. The estimated ejection fraction was in the range of 55% to 60%. Wall motion was normal; there were no regional wall motion abnormalities.  ------------------------------------------------------------------- Aortic valve:   Structurally normal valve. Trileaflet. Cusp separation was normal.  No evidence of vegetation.  Doppler:  There was trivial regurgitation.  ------------------------------------------------------------------- Aorta:  The aorta was normal, not dilated, and non-diseased.  ------------------------------------------------------------------- Mitral valve:  There is a flail P2 segment of the posterior mitral leaflet with ruptured cord that is seen prolapsing into the atrium past the valve plane. The anterior leaflet appears intact. There is severe, eccentric, anteriorly/septally directed mitral regurgitation.  ------------------------------------------------------------------- Left atrium:  The atrium was dilated.  No evidence of thrombus in the atrial cavity or appendage.  ------------------------------------------------------------------- Atrial septum:  No defect or patent foramen ovale was identified.    ------------------------------------------------------------------- Right ventricle:  The cavity size was normal. Wall thickness was normal. Systolic function was normal.  ------------------------------------------------------------------- Pulmonic valve:    Structurally normal valve.   Cusp separation was normal.  No evidence of vegetation.  Doppler:  There was trivial regurgitation.  ------------------------------------------------------------------- Tricuspid valve:   Structurally normal valve.   Leaflet separation was normal.  No evidence of vegetation.  Doppler:  There was mild regurgitation.  ------------------------------------------------------------------- Pulmonary artery:   The main pulmonary artery was normal-sized.  ------------------------------------------------------------------- Right atrium:  The atrium was normal in size.  No evidence of thrombus in the atrial cavity or appendage.  ------------------------------------------------------------------- Pericardium:  There was no pericardial effusion.   ------------------------------------------------------------------- Post procedure conclusions Ascending Aorta:  - The aorta was normal, not dilated, and non-diseased.  ------------------------------------------------------------------- Prepared and Electronically Authenticated by  Kenneth Hilty MD 2015-06-26T17:09:51  CARDIAC CATHETERIZATION REPORT   NAME: Desmund H Hagans MRN: [id number]   DOB: 03/05/1956 ADMIT DATE: 03/05/2014  Procedure Date: 03/05/2014   INTERVENTIONAL CARDIOLOGIST: David W Harding, M.D., MS   PRIMARY CARE PROVIDER: MITCHELL,DEAN, MD   PRIMARY CARDIOLOGIST: HARDING,DAVID W,   MD, MS   PATIENT: FERRIS FIELDEN is a 58 y.o. male with prior Anterior STEMI - PCI to LAD as well as Mitral Regurgitation / MVP. He has remained symptomatically stable, but surveillance Echocardiography identified significant progression of MR confirmed by TEE to be  Severe MR. He has been referred to CT Surgery with planned MVR in August. He now presents for R&LHC.   PRE-OPERATIVE DIAGNOSIS:   CAD- s/p PCI to LAD (for Anterior STEMI)    Severe MR with MVP PROCEDURES PERFORMED:   Right & Left Heart Catheterization with Native Coronary Angiography via Right Radial Artery & Right Brachial Vein Access. PROCEDURE: The patient was brought to the [facility] in the fasting state and prepped and draped in the usual sterile fashion for Right radial & brahcial access. A modified Allen's test was performed on the Right wrist demonstrating excellent collateral flow. Sterile technique was used including antiseptics, cap, gloves, gown, hand hygiene, mask and sheet. Skin prep: Chlorhexidine.   Consent: Risks of procedure as well as the alternatives and risks of each were explained to the (patient/caregiver). Consent for procedure obtained.   Time Out: Verified patient identification, verified procedure, site/side was marked, verified correct patient position, special equipment/implants available, medications/allergies/relevent history reviewed, required imaging and test results available. Performed.  Access:   Right Radial Artery: 6 Fr sheath -- Seldinger technique using Angiocath Micropuncture Kit    10 mL radial cocktail IA; 4500 Units IV Heparin    Right Brachial/Antecubital Vein: The existing 18-gauge IV was exchanged over a wire for a 5Fr short sheath --> after initial attempts to advance the Swan Ganz catheter were unsuccessful, this sheath was exchanged for a standard 5Fr glide sheath. Right Heart Catheterization: 5 Fr Gordy Councilman catheter advanced under fluoroscopy with balloon inflated to the RA, RV, then PCWP-PA for hemodynamic measurement.   Simultaneous FA & PA blood gases checked for SaO2% to calculate FICK CO/CI    Simultaneous PCWP/LV & RV/LV pressures monitored with Angled Pigtail in LV.    Catheter removed completely  out of the body with balloon deflated.    Left Heart Catheterization: 5Fr Catheters advanced or exchanged over a J-wire; TIG 4.0 catheter advanced first.    LV Hemodynamics (LV Gram): Angled Pigtail    Left Coronary Artery Cineangiography: TIG 4.0 & EBU Guide Catheter    Right Coronary Artery Cineangiography: TIG 4.0 Catheter    Venous Sheath removed in the holding area with manual pressure for hemostasis.    TR band: 1020 hours; 13 mL he EBL: < 10 ml, not including ABG and VBG samples   FINDINGS:  Hemodynamics:   Findings:    SaO2%   Pressures mmHg   Mean P    mmHg   EDP   mmHg    Right Atrium     8/6    6    Right Ventricle     45/7     17    Pulmonary Artery   65   41/17   27      PCWP   large V wave   24/35   21      Central Aortic   94   96/53   59      Left Ventricle     99/12     25                 Cardiac Output:     Cardiac Index:  Fick   5.15     2.37      Left Ventriculography: Deferred  Coronary Anatomy: Right dominant   Left Main: Large caliber vessel that trifurcates into the LAD, Circumflex and Ramus Intermedius. This is a short vessel but free of disease. LAD: A large caliber vessel with a proximal stent is widely patent. There does appear to be a step up and step down with the stent being larger diameter than the rest the vessel. There is about 30-40% stenosis just prior to the stent. The downstream LAD has a distal 40% stenosis near the apex.   D1: Small-caliber vessel it comes off from the stented segment. Minimal luminal irregularity. There is some mild ostial disease.    D2: Moderate caliber vessel with at least 4 branches covering the anterolateral wall. Angiographically normal. Left Circumflex: Moderate to large caliber vessel with mild little irregular it is. There is a small caliber mid OM branch as well as another smaller OM branch for the vessel itself terminates as a large inferolateral OM/LPL with mild little irregularities.   Ramus intermedius:  Small to moderate caliber vessel with proximal 40% tubular stenosis. The vessel only covers about two thirds of the anterolateral wall.   RCA: Large caliber, dominant vessel that bifurcates distally into the Right Posterior Descending Artery (RPDA) and the Right Posterior AV Groove Branch (RPAV). Just prior to bifurcation is roughly 40% stenosis.    RPDA: Moderate caliber vessel with a proximal roughly 40% stenosis    RPL Sysytem:The RPAV begins a large caliber vessel is of major illnesses. There is a smaller branch that has 40-50% proximal stenosis. MEDICATIONS:   Anesthesia: Local Lidocaine 3 ml  Sedation: 2 mg IV Versed, 25 mcg IV fentanyl ;  Omnipaque Contrast: 140 ml    Anticoagulation: IV Heparin 4500 Units  Radial Cocktail: 5 mg Verapamil, 400 mcg NTG, 2 ml 2% Lidocaine in 10 ml NS    IV NTG 200 mcg x 1    500 mL NS Bolus    Short run of Dopamine infusion at 10 mcg/mn reduced to 0 prior to completion of the procedure. PATIENT DISPOSITION:   The patient was transferred to the PACU holding area in a hemodynamicaly stable, chest pain free condition.    The patient tolerated the procedure well, and there were no complications. He did get hypotensive with mild bradycardia in high 40s    The patient was stable before, during, and after the procedure. POST-OPERATIVE DIAGNOSIS:   Moderate ~40% pre-stent stenosis in proximal LAD otherwise minimal CAD    Mildly elevated pulmonary pressures with normal Cardiac Output & Index by FICK.    Large V wave on PCWP waveform confirming Severe MR. PLAN OF CARE:   Standard post Radial cath care - will monitor an additional hour to ensure BP remains stable.    Discharge post bedrest    Plan to proceed with MVR via L thoracotomy minimally invasive approach, will need to stop Plavix 5-7 days pre-op.    F/u with Dr. Harding post -op. HARDING,DAVID W, M.D., M.S.   [facility]  [address]    [place], [place] [address]   [phone]   03/05/2014   10:25 AM   CT ANGIOGRAPHY CHEST, ABDOMEN AND PELVIS  TECHNIQUE:   Multidetector CT imaging through the chest, abdomen and pelvis was   performed using the standard protocol during bolus administration of   intravenous contrast. Multiplanar reconstructed images and MIPs were     obtained and reviewed to evaluate the vascular anatomy.   CONTRAST: 39m OMNIPAQUE IOHEXOL 350 MG/ML SOLN   COMPARISON: None.   FINDINGS:   CTA CHEST FINDINGS   VASCULAR   Heart/Vascular: Left atrial dilatation concerning for mitral valve   insufficiency. Probable mild left ventricular concentric   hypertrophy. Atherosclerotic calcifications present within the   coronary arteries. There appears to be a stent in the proximal left   anterior descending coronary artery. No pericardial effusion.   Limited evaluation of the mitral valve secondary to non cardiac   gated technique. Conventional 3 vessel arch anatomy. Trace   atherosclerotic calcifications without evidence of dissection,   aneurysmal dilatation or significant stenosis. Normal size main and   central pulmonary arteries.   Review of the MIP images confirms the above findings.   NON VASCULAR   Mediastinum: Unremarkable appearance of the thyroid gland.   Nonspecific low-attenuation mediastinal adenopathy. A right   paratracheal lymph node measures 1.2 cm in short axis while a low   right paratracheal node measures 1.4 cm in short axis. Additionally,   there are calcified right hilar nodes which are not enlarged by CT   criteria. Unremarkable thoracic esophagus.   Lungs/Pleura: No pleural effusion. Nonspecific focus of ground-glass   attenuation nodular opacity in the right middle lobe measures 1.2 x   1.0 cm on image 46 of series 5. The solid component measures   approximately 5 mm. Linear atelectasis versus scarring present   within the right lower lobe. Trace dependent atelectasis posteriorly   in  the lower lobes. No focal airspace consolidation, evidence of   pulmonary edema or emphysema.   Bones/Soft Tissues: No acute fracture or aggressive appearing lytic   or blastic osseous lesion.   CTA ABDOMEN AND PELVIS FINDINGS   VASCULAR   Aorta: Normal caliber aorta with out evidence of aneurysm,   dissection or significant atherosclerotic plaque.   Celiac: Widely patent. Conventional hepatic arterial anatomy.   SMA: Widely patent.   Renals: Two co-dominant right renal arteries which are widely patent   and demonstrate no evidence of fibromuscular dysplasia. On the left,   there is a single renal artery with trace atherosclerotic   calcification at the origin but no evidence of a stenosis. No   changes of fibromuscular dysplasia.   IMA: Widely patent and unremarkable.   Inflow: Mildly tortuous common iliac arteries. No evidence of   atherosclerotic plaque, aneurysm, dissection or other abnormality.   The bilateral internal and external iliac arteries are also   unremarkable.   Proximal Outflow: Unremarkable appearance of the bilateral common,   superficial and profunda femoral arteries. Single metallic artifact   identified over lying in the right common femoral artery. Probable   StarClose extra arterial closure device.   Veins: Given limitations of non venous phase timing, no focal   abnormality identified.   Review of the MIP images confirms the above findings.   NON-VASCULAR   Abdomen: Unremarkable CT appearance of the stomach, duodenum,   spleen, adrenal glands and pancreas. Normal hepatic contour and   morphology. No suspicious hepatic lesion. 9 mm focus with peripheral   incomplete nodular enhancement in the right hemi liver (image 131   series 4) is incompletely evaluated without delayed imaging but   highly likely a benign hemangioma. Gallbladder is unremarkable. No   intra or extrahepatic biliary ductal dilatation. Unremarkable   appearance of the bilateral kidneys. No  focal solid lesion,   hydronephrosis or nephrolithiasis.  Colonic diverticular disease without CT evidence of active   inflammation. No evidence of obstruction or focal bowel wall   thickening. Normal appendix in the right lower quadrant. The   terminal ileum is unremarkable. No free fluid or suspicious   adenopathy.   Pelvis: Unremarkable bladder, prostate gland and seminal vesicles.   No free fluid or suspicious adenopathy.   Bones/Soft Tissues: No acute fracture or aggressive appearing lytic   or blastic osseous lesion. Focal L2-L3 degenerative disc disease.   IMPRESSION:   CTA CHEST   1. Suspicious 1.2 x 1.0 cm semi-solid (central solid component   measures 5 mm) ground-glass attenuation nodular opacity within the   right middle lobe. Differential considerations include a focus of   active infection/inflammation, parenchymal scarring from a remote   infectious/inflammatory process and primary bronchogenic   adenocarcinoma. Initial follow-up by chest CT without contrast is   recommended in 3 months to confirm persistence. This recommendation   follows the consensus statement: Recommendations for the Management   of Subsolid Pulmonary Nodules Detected at CT: A Statement from the   Fleischner Society as published in Radiology 2013; 266:304-317.   2. Nonspecific, low-attenuation mediastinal lymphadenopathy.   Recommend attention on three-month follow-up imaging.   3. Focal left atrial and left atrial appendage dilatation consistent   with the clinical history of mitral valve insufficiency.   4. Borderline concentric hypertrophy of the left ventricular   myocardium. Query clinical history of systemic arterial   hypertension?   5. Atherosclerosis including coronary artery disease. There appears   to be a metallic stent in the left anterior descending coronary   artery.   6. Unremarkable appearance of the thoracic aorta.   CTA ABD/PELVIS   1. Essentially unremarkable appearance of  the abdominal aorta and   branch vessels for age as detailed above.   2. Access vessels are widely patent, minimally tortuous and   essentially plaque free.   3. Small metallic clip overlying the right common femoral artery   likely represents a StarClose extra arterial closure device.   These results will be called to the ordering clinician or   representative by the Radiologist Assistant, and communication   documented in the PACS or zVision Dashboard.   Signed,   Heath K. McCullough, MD   [facility]   [facility]   Electronically Signed   By: Heath McCullough M.D.   On: 03/13/2014 14:10      Impression:  The patient has stage C asymptomatic severe primary mitral regurgitation. I have personally reviewed the patient's recent transthoracic and transesophageal echocardiograms. He has mitral valve prolapse with a large flail segment of the middle scallop (P2) of the posterior leaflet with severe (4+) mitral regurgitation. Left ventricular systolic function is preserved.  Cardiac catheterization demonstrates 40% proximal stenosis of the left anterior descending coronary artery with otherwise widely patent stents in the left anterior descending coronary artery and no other significant flow limiting coronary artery disease. CT angiography is notable for the absence of significant aortoiliac occlusive disease, and as such the patient appears to be a relatively good candidate for a minimally invasive approach for surgery.  However, the CT scan also reveals a small "suspicious" groundglass opacity deep within the parenchyma of the right middle lobe that requires followup. The patient has no history of tobacco use and would otherwise be at relatively low risk for primary lung cancer.  Unfortunately, the location of this nodule is deep within the parenchyma of the   right middle lobe and therefore not be easy to biopsy at the time of right mini thoracotomy  for minimally invasive mitral valve repair.  Moreover, postoperatively it is very possible that the radiographic appearance of the entire right middle lobe may be at least temporarily altered by the right mini thoracotomy approach for mitral valve repair.     Plan:  I discussed options at length with the patient and his wife in the office today. I explained the significance of the incidental lung nodule discovered on CT scan. Although the likelihood that this could be an invasive lung cancer is probably quite small, it definitely requires followup.  Under the circumstances I would favor postponing surgery for approximately 3 months and getting a second repeat chest CT scan to ensure stability or resolution of this lesion.  The rationale for this recommendation was discussed at length. However, the patient is very anxious about his mitral valve surgery, and at this time he insists on proceeding with mitral valve repair as soon as practical. Both he and his wife understand that he might develop significant scar tissue or adhesions around the right middle lobe which could affect either the radiographic appearance of the right middle lobe lesion or the risks associated with surgical resection in the future.  Following a decision to proceed with surgery in the immediate future, I again reviewed the indications, risks, and potential benefits of mitral valve repair with the patient and his wife.  They understand and accept all potential risks of surgery including but not limited to risk of death, stroke or other neurologic complication, myocardial infarction, congestive heart failure, respiratory failure, renal failure, bleeding requiring transfusion and/or reexploration, arrhythmia, infection or other wound complications, pneumonia, pleural and/or pericardial effusion, pulmonary embolus, aortic dissection or other major vascular complication, or delayed complications related to valve repair or replacement including  but not limited to structural valve deterioration and failure, thrombosis, embolization, endocarditis, or paravalvular leak.  The patient will return for followup on Monday, 04/06/2014 prior to surgery on Wednesday, 04/08/2014.  He will stop taking Plavix 7 days before his operation but continued taking aspirin.  He has been given a prescription for amiodarone to begin 7 days prior to surgery to decrease his risk of perioperative atrial dysrhythmias.  All of their questions have been answered.     Valentina Gu. Roxy Manns, MD

## 2014-04-06 NOTE — Pre-Procedure Instructions (Signed)
Alan [name]  04/06/2014   Your procedure is scheduled on:  Wednesday April 08, 2014 at 8:30 AM.  Report to [facility] Admitting at 06:30 AM.  Call this number if you have problems the morning of surgery: [phone]   Remember:   Do not eat food or drink liquids after midnight.   Take these medicines the morning of surgery with A SIP OF WATER: Amiodarone (Pacerone)    STOP Vitamin D, CoQ10, Plavix, Multiple Vitamins, Probiotic today   STOP/ Do not take Aleve, Naproxen, Advil, Ibuprofen, Motrin, Vitamins, Herbs, or Supplements starting today   Do not wear jewelry.  Do not wear lotions, powders, or colgone.  Men may shave face and neck.  Do not bring valuables to the hospital.  [facility] is not responsible for any belongings or valuables.               Contacts, dentures or bridgework may not be worn into surgery.  Leave suitcase in the car. After surgery it may be brought to your room.  For patients admitted to the hospital, discharge time is determined by your treatment team.               Special Instructions: See [facility] Preparing For Surgery; Shower the night before your surgery and the morning of your surgery using CHG soap   Please read over the following fact sheets that you were given: Pain Booklet, Coughing and Deep Breathing, Blood Transfusion Information and Surgical Site Infection Prevention

## 2014-04-06 NOTE — Progress Notes (Signed)
04/06/14 1252  OBSTRUCTIVE SLEEP APNEA  Have you ever been diagnosed with sleep apnea through a sleep study? No  Do you snore loudly (loud enough to be heard through closed doors)?  0  Do you often feel tired, fatigued, or sleepy during the daytime? 0  Has anyone observed you stop breathing during your sleep? 0  Do you have, or are you being treated for high blood pressure? 1  BMI more than 35 kg/m2? 0  Age over 57 years old? 1  Neck circumference greater than 40 cm/16 inches? 1  Gender: 1  Obstructive Sleep Apnea Score 4  Score 4 or greater  Results sent to PCP   This patient has screened at risk for sleep apnea using the STOP bang tool during a pre-surgical visit. A score of 4 or greater is at risk for sleep apnea.

## 2014-04-06 NOTE — Progress Notes (Signed)
Alan [name]       [place] [address]             [phone]     CARDIOTHORACIC SURGERY OFFICE NOTE  Referring Provider is Alan Man, MD PCP is Alan Coffin, MD   HPI:  Patient returns for followup of stage C asymptomatic severe primary mitral regurgitation. He was originally seen in consultation on 03/03/2014 and he was seen more recently on 03/16/2014. He now returns with tentative plans to proceed with elective mitral valve repair later this week. He reports no new problems or complaints over last 2 weeks.    Current Outpatient Prescriptions  Medication Sig Dispense Refill  . amiodarone (PACERONE) 200 MG tablet Take 200 mg by mouth 2 (two) times daily. For 7 days      . aspirin 81 MG tablet Take 81 mg by mouth daily.       . cholecalciferol (VITAMIN D) 1000 UNITS tablet Take 1,000 Units by mouth daily.      . clopidogrel (PLAVIX) 75 MG tablet Take 75 mg by mouth daily.      . Coenzyme Q10 (CO Q 10) 100 MG CAPS Take 100 mg by mouth daily.       Marland Kitchen losartan (COZAAR) 25 MG tablet Take 25 mg by mouth daily.      . Multiple Vitamins-Minerals (MULTIVITAMINS THER. W/MINERALS) TABS Take 1 tablet by mouth daily.      . Probiotic Product (PROBIOTIC DAILY PO) Take 1 tablet by mouth daily.      Marland Kitchen ALPRAZolam (XANAX) 0.5 MG tablet Take 1 tablet (0.5 mg total) by mouth at bedtime as needed for anxiety or sleep.  5 tablet  0   No current facility-administered medications for this visit.      Physical Exam:   BP 128/78  Pulse 71  Ht 6\' 4"  (1.93 m)  Wt 193 lb (87.544 kg)  BMI 23.50 kg/m2  SpO2 97%  General:  Well appearing  Chest:   Clear  CV:   Regular rate and rhythm with holosystolic murmur  Incisions:  n/a  Abdomen:  Soft and nontender  Extremities:  Warm and well-perfused  Diagnostic Tests:  CHEST  2 VIEW   COMPARISON:  CT scan of the chest of March 13, 2014   FINDINGS: The lungs are well-expanded and clear. The heart and  pulmonary vascularity are normal. There is no pleural effusion. The mediastinum is normal in width. The bony thorax is unremarkable.   IMPRESSION: There is no acute cardiopulmonary abnormality.     Electronically Signed   By: Alan  [name]   On: 04/06/2014 14:48      Impression:  The patient has stage C asymptomatic severe primary mitral regurgitation. I have personally reviewed the patient's recent transthoracic and transesophageal echocardiograms. He has mitral valve prolapse with a large flail segment of the middle scallop (P2) of the posterior leaflet with severe (4+) mitral regurgitation. Left ventricular systolic function is preserved. Cardiac catheterization demonstrates 40% proximal stenosis of the left anterior descending coronary artery was otherwise widely patent stents in the left anterior descending coronary artery and no other significant flow limiting coronary artery disease. CT angiography is notable for the absence of significant aortoiliac occlusive disease, and as such the patient appears to be a relatively good candidate for a minimally invasive approach for surgery.    Plan:  I again reviewed the indications, risks, and potential benefits of surgery with Alan [name] in the office  today. Expectations for his postoperative convalescence at been discussed at length. All of his questions been addressed.  I spent in excess of 15 minutes during the conduct of this office consultation and >50% of this time involved direct face-to-face encounter with the patient for counseling and/or coordination of their care.   Alan Gu. Roxy Manns, MD 04/06/2014 2:57 PM

## 2014-04-06 NOTE — Progress Notes (Signed)
Mupirocin ointment Rx called into [facility] ([phone]) for positive PCR of staph. Pt notified and voiced understanding.

## 2014-04-06 NOTE — Progress Notes (Signed)
Pre-op Cardiac Surgery  Carotid Findings:  1-39% ICA stenosis.  Vertebral artery flow is antegrade.  Upper Extremity Right Left  Brachial Pressures 119T 116T  Radial Waveforms T T  Ulnar Waveforms T T  Palmar Arch (Allen's Test) Doppler signal remains normal with radial compression and obliterates with ulnar compression Doppler signal remains normal with radial compression and obliterates with ulnar compression   Findings:      Lower  Extremity Right Left  Dorsalis Pedis    Anterior Tibial    Posterior Tibial    Ankle/Brachial Indices      Findings:

## 2014-04-06 NOTE — Patient Instructions (Signed)
Nothing to eat or drink after midnight the night before surgery Do not take any medications on the morning of surgery

## 2014-04-07 ENCOUNTER — Other Ambulatory Visit: Payer: Self-pay

## 2014-04-07 ENCOUNTER — Encounter (HOSPITAL_COMMUNITY): Payer: Self-pay | Admitting: Certified Registered Nurse Anesthetist

## 2014-04-07 DIAGNOSIS — I340 Nonrheumatic mitral (valve) insufficiency: Secondary | ICD-10-CM

## 2014-04-07 MED ORDER — HEPARIN SODIUM (PORCINE) 1000 UNIT/ML IJ SOLN
INTRAMUSCULAR | Status: DC
Start: 2014-04-08 — End: 2014-04-08
  Filled 2014-04-07: qty 30

## 2014-04-07 MED ORDER — METOPROLOL TARTRATE 12.5 MG HALF TABLET
12.5000 mg | ORAL_TABLET | Freq: Once | ORAL | Status: AC
  Administered 2014-04-08: 12.5 mg via ORAL
  Filled 2014-04-07: qty 1

## 2014-04-07 MED ORDER — PLASMA-LYTE 148 IV SOLN
INTRAVENOUS | Status: AC
  Administered 2014-04-08: 11:00:00
  Filled 2014-04-07: qty 2.5

## 2014-04-07 MED ORDER — VANCOMYCIN HCL 10 G IV SOLR
1500.0000 mg | INTRAVENOUS | Status: AC
  Administered 2014-04-08: 1500 mg via INTRAVENOUS
  Filled 2014-04-07: qty 1500

## 2014-04-07 MED ORDER — DOPAMINE-DEXTROSE 3.2-5 MG/ML-% IV SOLN
2.0000 ug/kg/min | INTRAVENOUS | Status: DC
  Filled 2014-04-07: qty 250

## 2014-04-07 MED ORDER — EPINEPHRINE HCL 1 MG/ML IJ SOLN
0.5000 ug/min | INTRAVENOUS | Status: DC
  Filled 2014-04-07: qty 4

## 2014-04-07 MED ORDER — DEXMEDETOMIDINE HCL IN NACL 400 MCG/100ML IV SOLN
0.1000 ug/kg/h | INTRAVENOUS | Status: DC
  Filled 2014-04-07: qty 100

## 2014-04-07 MED ORDER — MAGNESIUM SULFATE 50 % IJ SOLN
40.0000 meq | INTRAMUSCULAR | Status: DC
  Filled 2014-04-07: qty 10

## 2014-04-07 MED ORDER — PHENYLEPHRINE HCL 10 MG/ML IJ SOLN
30.0000 ug/min | INTRAVENOUS | Status: DC
  Filled 2014-04-07: qty 2

## 2014-04-07 MED ORDER — POTASSIUM CHLORIDE 2 MEQ/ML IV SOLN
80.0000 meq | INTRAVENOUS | Status: DC
  Filled 2014-04-07: qty 40

## 2014-04-07 MED ORDER — LEVOFLOXACIN IN D5W 500 MG/100ML IV SOLN
500.0000 mg | INTRAVENOUS | Status: AC
  Administered 2014-04-08: 500 mg via INTRAVENOUS
  Filled 2014-04-07: qty 100

## 2014-04-07 MED ORDER — INSULIN REGULAR HUMAN 100 UNIT/ML IJ SOLN
INTRAMUSCULAR | Status: DC
  Filled 2014-04-07: qty 2.5

## 2014-04-07 MED ORDER — GLUTARALDEHYDE 0.625% SOAKING SOLUTION: TOPICAL | Status: DC | PRN

## 2014-04-07 MED ORDER — AMINOCAPROIC ACID 250 MG/ML IV SOLN
INTRAVENOUS | Status: DC
  Filled 2014-04-07: qty 40

## 2014-04-07 MED ORDER — NITROGLYCERIN IN D5W 200-5 MCG/ML-% IV SOLN
2.0000 ug/min | INTRAVENOUS | Status: DC
  Filled 2014-04-07: qty 250

## 2014-04-07 MED ORDER — VANCOMYCIN HCL 1000 MG IV SOLR
INTRAVENOUS | Status: AC
  Administered 2014-04-08: 12:00:00
  Filled 2014-04-07: qty 1000

## 2014-04-08 ENCOUNTER — Encounter (HOSPITAL_COMMUNITY)
Admission: RE | Disposition: A | Discharge status: Home / Self Care | Payer: Federal, State, Local not specified - PPO | Source: Ambulatory Visit | Attending: Thoracic Surgery (Cardiothoracic Vascular Surgery)

## 2014-04-08 ENCOUNTER — Encounter (HOSPITAL_COMMUNITY): Payer: Self-pay | Admitting: *Deleted

## 2014-04-08 ENCOUNTER — Inpatient Hospital Stay (HOSPITAL_COMMUNITY): Payer: Federal, State, Local not specified - PPO

## 2014-04-08 ENCOUNTER — Encounter (HOSPITAL_COMMUNITY): Payer: Federal, State, Local not specified - PPO | Admitting: Certified Registered Nurse Anesthetist

## 2014-04-08 ENCOUNTER — Inpatient Hospital Stay (HOSPITAL_COMMUNITY)
Admission: RE | Admit: 2014-04-08 | Discharge: 2014-04-12 | DRG: 220 | Disposition: A | Discharge status: Home / Self Care | Payer: Federal, State, Local not specified - PPO | Source: Ambulatory Visit | Attending: Thoracic Surgery (Cardiothoracic Vascular Surgery) | Admitting: Thoracic Surgery (Cardiothoracic Vascular Surgery)

## 2014-04-08 ENCOUNTER — Inpatient Hospital Stay (HOSPITAL_COMMUNITY): Payer: Federal, State, Local not specified - PPO | Admitting: Certified Registered Nurse Anesthetist

## 2014-04-08 DIAGNOSIS — I2510 Atherosclerotic heart disease of native coronary artery without angina pectoris: Secondary | ICD-10-CM

## 2014-04-08 DIAGNOSIS — R011 Cardiac murmur, unspecified: Secondary | ICD-10-CM | POA: Diagnosis present

## 2014-04-08 DIAGNOSIS — Z881 Allergy status to other antibiotic agents status: Secondary | ICD-10-CM | POA: Diagnosis not present

## 2014-04-08 DIAGNOSIS — I252 Old myocardial infarction: Secondary | ICD-10-CM | POA: Diagnosis not present

## 2014-04-08 DIAGNOSIS — I341 Nonrheumatic mitral (valve) prolapse: Secondary | ICD-10-CM | POA: Diagnosis present

## 2014-04-08 DIAGNOSIS — R911 Solitary pulmonary nodule: Secondary | ICD-10-CM | POA: Diagnosis present

## 2014-04-08 DIAGNOSIS — Z8249 Family history of ischemic heart disease and other diseases of the circulatory system: Secondary | ICD-10-CM | POA: Diagnosis not present

## 2014-04-08 DIAGNOSIS — Z880 Allergy status to penicillin: Secondary | ICD-10-CM

## 2014-04-08 DIAGNOSIS — I059 Rheumatic mitral valve disease, unspecified: Secondary | ICD-10-CM | POA: Diagnosis present

## 2014-04-08 DIAGNOSIS — J9819 Other pulmonary collapse: Secondary | ICD-10-CM | POA: Diagnosis not present

## 2014-04-08 DIAGNOSIS — E785 Hyperlipidemia, unspecified: Secondary | ICD-10-CM | POA: Diagnosis present

## 2014-04-08 DIAGNOSIS — D6959 Other secondary thrombocytopenia: Secondary | ICD-10-CM | POA: Diagnosis not present

## 2014-04-08 DIAGNOSIS — I10 Essential (primary) hypertension: Secondary | ICD-10-CM | POA: Diagnosis present

## 2014-04-08 DIAGNOSIS — D62 Acute posthemorrhagic anemia: Secondary | ICD-10-CM | POA: Diagnosis not present

## 2014-04-08 DIAGNOSIS — K219 Gastro-esophageal reflux disease without esophagitis: Secondary | ICD-10-CM | POA: Diagnosis present

## 2014-04-08 DIAGNOSIS — Z9861 Coronary angioplasty status: Secondary | ICD-10-CM | POA: Diagnosis not present

## 2014-04-08 DIAGNOSIS — Z7982 Long term (current) use of aspirin: Secondary | ICD-10-CM | POA: Diagnosis not present

## 2014-04-08 DIAGNOSIS — Z9889 Other specified postprocedural states: Secondary | ICD-10-CM

## 2014-04-08 DIAGNOSIS — Z7902 Long term (current) use of antithrombotics/antiplatelets: Secondary | ICD-10-CM

## 2014-04-08 DIAGNOSIS — I340 Nonrheumatic mitral (valve) insufficiency: Secondary | ICD-10-CM | POA: Diagnosis present

## 2014-04-08 HISTORY — DX: Other specified postprocedural states: Z98.890

## 2014-04-08 HISTORY — PX: INTRAOPERATIVE TRANSESOPHAGEAL ECHOCARDIOGRAM: SHX5062

## 2014-04-08 HISTORY — PX: MITRAL VALVE REPAIR: SHX2039

## 2014-04-08 LAB — POCT I-STAT, CHEM 8
BUN: 16 mg/dL (ref 6–23)
BUN: 15 mg/dL (ref 6–23)
BUN: 16 mg/dL (ref 6–23)
BUN: 14 mg/dL (ref 6–23)
BUN: 14 mg/dL (ref 6–23)
BUN: 13 mg/dL (ref 6–23)
CALCIUM ION: 1.27 mmol/L — AB (ref 1.12–1.23)
CHLORIDE: 107 meq/L (ref 96–112)
CHLORIDE: 107 meq/L (ref 96–112)
CHLORIDE: 109 meq/L (ref 96–112)
CREATININE: 0.6 mg/dL (ref 0.50–1.35)
CREATININE: 0.6 mg/dL (ref 0.50–1.35)
Calcium, Ion: 1.09 mmol/L — ABNORMAL LOW (ref 1.12–1.23)
Calcium, Ion: 1.26 mmol/L — ABNORMAL HIGH (ref 1.12–1.23)
Calcium, Ion: 1.13 mmol/L (ref 1.12–1.23)
Calcium, Ion: 1.12 mmol/L (ref 1.12–1.23)
Calcium, Ion: 1.16 mmol/L (ref 1.12–1.23)
Chloride: 105 mEq/L (ref 96–112)
Chloride: 106 mEq/L (ref 96–112)
Chloride: 110 mEq/L (ref 96–112)
Creatinine, Ser: 0.7 mg/dL (ref 0.50–1.35)
Creatinine, Ser: 0.7 mg/dL (ref 0.50–1.35)
Creatinine, Ser: 0.7 mg/dL (ref 0.50–1.35)
Creatinine, Ser: 0.7 mg/dL (ref 0.50–1.35)
GLUCOSE: 81 mg/dL (ref 70–99)
GLUCOSE: 106 mg/dL — AB (ref 70–99)
Glucose, Bld: 158 mg/dL — ABNORMAL HIGH (ref 70–99)
Glucose, Bld: 109 mg/dL — ABNORMAL HIGH (ref 70–99)
Glucose, Bld: 119 mg/dL — ABNORMAL HIGH (ref 70–99)
Glucose, Bld: 142 mg/dL — ABNORMAL HIGH (ref 70–99)
HCT: 27 % — ABNORMAL LOW (ref 39.0–52.0)
HCT: 24 % — ABNORMAL LOW (ref 39.0–52.0)
HEMATOCRIT: 37 % — AB (ref 39.0–52.0)
HEMATOCRIT: 32 % — AB (ref 39.0–52.0)
HEMATOCRIT: 22 % — AB (ref 39.0–52.0)
HEMATOCRIT: 30 % — AB (ref 39.0–52.0)
HEMOGLOBIN: 12.6 g/dL — AB (ref 13.0–17.0)
HEMOGLOBIN: 9.2 g/dL — AB (ref 13.0–17.0)
Hemoglobin: 10.9 g/dL — ABNORMAL LOW (ref 13.0–17.0)
Hemoglobin: 8.2 g/dL — ABNORMAL LOW (ref 13.0–17.0)
Hemoglobin: 7.5 g/dL — ABNORMAL LOW (ref 13.0–17.0)
Hemoglobin: 10.2 g/dL — ABNORMAL LOW (ref 13.0–17.0)
POTASSIUM: 3.8 meq/L (ref 3.7–5.3)
POTASSIUM: 3.8 meq/L (ref 3.7–5.3)
POTASSIUM: 4.8 meq/L (ref 3.7–5.3)
Potassium: 3.9 mEq/L (ref 3.7–5.3)
Potassium: 4.1 mEq/L (ref 3.7–5.3)
Potassium: 3.9 mEq/L (ref 3.7–5.3)
SODIUM: 141 meq/L (ref 137–147)
SODIUM: 139 meq/L (ref 137–147)
SODIUM: 141 meq/L (ref 137–147)
Sodium: 140 mEq/L (ref 137–147)
Sodium: 139 mEq/L (ref 137–147)
Sodium: 139 mEq/L (ref 137–147)
TCO2: 25 mmol/L (ref 0–100)
TCO2: 23 mmol/L (ref 0–100)
TCO2: 23 mmol/L (ref 0–100)
TCO2: 25 mmol/L (ref 0–100)
TCO2: 20 mmol/L (ref 0–100)
TCO2: 17 mmol/L (ref 0–100)

## 2014-04-08 LAB — POCT I-STAT 3, ART BLOOD GAS (G3+)
ACID-BASE DEFICIT: 4 mmol/L — AB (ref 0.0–2.0)
BICARBONATE: 20.5 meq/L (ref 20.0–24.0)
Bicarbonate: 25 mEq/L — ABNORMAL HIGH (ref 20.0–24.0)
O2 Saturation: 100 %
O2 Saturation: 90 %
PCO2 ART: 43.8 mmHg (ref 35.0–45.0)
PH ART: 7.365 (ref 7.350–7.450)
PH ART: 7.406 (ref 7.350–7.450)
PO2 ART: 53 mmHg — AB (ref 80.0–100.0)
Patient temperature: 35
TCO2: 26 mmol/L (ref 0–100)
TCO2: 22 mmol/L (ref 0–100)
pCO2 arterial: 32 mmHg — ABNORMAL LOW (ref 35.0–45.0)
pO2, Arterial: 340 mmHg — ABNORMAL HIGH (ref 80.0–100.0)

## 2014-04-08 LAB — HEMOGLOBIN AND HEMATOCRIT, BLOOD
HEMATOCRIT: 24.9 % — AB (ref 39.0–52.0)
Hemoglobin: 8.8 g/dL — ABNORMAL LOW (ref 13.0–17.0)

## 2014-04-08 LAB — POCT I-STAT 4, (NA,K, GLUC, HGB,HCT)
Glucose, Bld: 103 mg/dL — ABNORMAL HIGH (ref 70–99)
HEMATOCRIT: 31 % — AB (ref 39.0–52.0)
Hemoglobin: 10.5 g/dL — ABNORMAL LOW (ref 13.0–17.0)
Potassium: 3.7 mEq/L (ref 3.7–5.3)
Sodium: 142 mEq/L (ref 137–147)

## 2014-04-08 LAB — CBC
HCT: 31.1 % — ABNORMAL LOW (ref 39.0–52.0)
HCT: 31.9 % — ABNORMAL LOW (ref 39.0–52.0)
Hemoglobin: 10.9 g/dL — ABNORMAL LOW (ref 13.0–17.0)
Hemoglobin: 11.2 g/dL — ABNORMAL LOW (ref 13.0–17.0)
MCH: 32.2 pg (ref 26.0–34.0)
MCH: 32 pg (ref 26.0–34.0)
MCHC: 35 g/dL (ref 30.0–36.0)
MCHC: 35.1 g/dL (ref 30.0–36.0)
MCV: 92 fL (ref 78.0–100.0)
MCV: 91.1 fL (ref 78.0–100.0)
PLATELETS: 88 10*3/uL — AB (ref 150–400)
PLATELETS: 91 10*3/uL — AB (ref 150–400)
RBC: 3.38 MIL/uL — ABNORMAL LOW (ref 4.22–5.81)
RBC: 3.5 MIL/uL — AB (ref 4.22–5.81)
RDW: 12.5 % (ref 11.5–15.5)
RDW: 12.6 % (ref 11.5–15.5)
WBC: 6.9 10*3/uL (ref 4.0–10.5)
WBC: 8.8 10*3/uL (ref 4.0–10.5)

## 2014-04-08 LAB — GLUCOSE, CAPILLARY
GLUCOSE-CAPILLARY: 88 mg/dL (ref 70–99)
GLUCOSE-CAPILLARY: 112 mg/dL — AB (ref 70–99)
GLUCOSE-CAPILLARY: 106 mg/dL — AB (ref 70–99)
Glucose-Capillary: 100 mg/dL — ABNORMAL HIGH (ref 70–99)
Glucose-Capillary: 138 mg/dL — ABNORMAL HIGH (ref 70–99)

## 2014-04-08 LAB — APTT: aPTT: 37 seconds (ref 24–37)

## 2014-04-08 LAB — CREATININE, SERUM
Creatinine, Ser: 0.78 mg/dL (ref 0.50–1.35)
GFR calc Af Amer: >90 mL/min (ref >90)
GFR calc non Af Amer: >90 mL/min (ref >90)

## 2014-04-08 LAB — PLATELET COUNT: Platelets: 98 10*3/uL — ABNORMAL LOW (ref 150–400)

## 2014-04-08 LAB — MAGNESIUM: Magnesium: 2.6 mg/dL — ABNORMAL HIGH (ref 1.5–2.5)

## 2014-04-08 LAB — POCT I-STAT GLUCOSE
GLUCOSE: 133 mg/dL — AB (ref 70–99)
Operator id: 3402

## 2014-04-08 LAB — PROTIME-INR
INR: 1.57 — ABNORMAL HIGH (ref 0.00–1.49)
PROTHROMBIN TIME: 18.8 s — AB (ref 11.6–15.2)

## 2014-04-08 SURGERY — REPAIR, MITRAL VALVE, MINIMALLY INVASIVE
Anesthesia: General | Site: Chest | Laterality: Right

## 2014-04-08 MED ORDER — METOPROLOL TARTRATE 12.5 MG HALF TABLET
12.5000 mg | ORAL_TABLET | Freq: Two times a day (BID) | ORAL | Status: DC
  Administered 2014-04-10 – 2014-04-12 (×4): 12.5 mg via ORAL
  Filled 2014-04-08 (×9): qty 1

## 2014-04-08 MED ORDER — FENTANYL CITRATE 0.05 MG/ML IJ SOLN
INTRAMUSCULAR | Status: AC
  Filled 2014-04-08: qty 5

## 2014-04-08 MED ORDER — 0.9 % SODIUM CHLORIDE (POUR BTL) OPTIME
TOPICAL | Status: DC | PRN
  Administered 2014-04-08: 6000 mL

## 2014-04-08 MED ORDER — GLUTARALDEHYDE 0.625% SOAKING SOLUTION
Freq: Once | TOPICAL | Status: DC
  Filled 2014-04-08: qty 50

## 2014-04-08 MED ORDER — SODIUM CHLORIDE 0.9 % IJ SOLN
3.0000 mL | Freq: Two times a day (BID) | INTRAMUSCULAR | Status: DC
  Administered 2014-04-09 – 2014-04-12 (×7): 3 mL via INTRAVENOUS

## 2014-04-08 MED ORDER — VANCOMYCIN HCL 1000 MG IV SOLR
INTRAVENOUS | Status: AC
  Administered 2014-04-08: 14:00:00
  Filled 2014-04-08: qty 1000

## 2014-04-08 MED ORDER — MUPIROCIN 2 % EX OINT
1.0000 "application " | TOPICAL_OINTMENT | Freq: Two times a day (BID) | CUTANEOUS | Status: DC
  Administered 2014-04-08 – 2014-04-12 (×8): 1 via NASAL
  Filled 2014-04-08: qty 22

## 2014-04-08 MED ORDER — LACTATED RINGERS IV SOLN: 500.0000 mL | Freq: Once | INTRAVENOUS | Status: AC | PRN

## 2014-04-08 MED ORDER — MIDAZOLAM HCL 2 MG/2ML IJ SOLN: 2.0000 mg | INTRAMUSCULAR | Status: DC | PRN

## 2014-04-08 MED ORDER — BUPIVACAINE 0.5 % ON-Q PUMP SINGLE CATH 400 ML: 400.0000 mL | INJECTION | Status: DC

## 2014-04-08 MED ORDER — SODIUM CHLORIDE 0.9 % IV SOLN
INTRAVENOUS | Status: DC
  Administered 2014-04-08: 1.6 [IU]/h via INTRAVENOUS
  Filled 2014-04-08: qty 2.5

## 2014-04-08 MED ORDER — ACETAMINOPHEN 650 MG RE SUPP
650.0000 mg | Freq: Once | RECTAL | Status: AC
  Administered 2014-04-08: 650 mg via RECTAL

## 2014-04-08 MED ORDER — ROCURONIUM BROMIDE 100 MG/10ML IV SOLN
INTRAVENOUS | Status: DC | PRN
  Administered 2014-04-08: 40 mg via INTRAVENOUS
  Administered 2014-04-08: 60 mg via INTRAVENOUS

## 2014-04-08 MED ORDER — ACETAMINOPHEN 500 MG PO TABS
1000.0000 mg | ORAL_TABLET | Freq: Four times a day (QID) | ORAL | Status: DC
  Administered 2014-04-09 – 2014-04-12 (×11): 1000 mg via ORAL
  Filled 2014-04-08 (×20): qty 2

## 2014-04-08 MED ORDER — METOPROLOL TARTRATE 25 MG/10 ML ORAL SUSPENSION
12.5000 mg | Freq: Two times a day (BID) | ORAL | Status: DC
  Filled 2014-04-08 (×3): qty 5

## 2014-04-08 MED ORDER — DOCUSATE SODIUM 100 MG PO CAPS
200.0000 mg | ORAL_CAPSULE | Freq: Every day | ORAL | Status: DC
  Administered 2014-04-09 – 2014-04-12 (×4): 200 mg via ORAL
  Filled 2014-04-08 (×5): qty 2

## 2014-04-08 MED ORDER — VECURONIUM BROMIDE 10 MG IV SOLR
INTRAVENOUS | Status: DC | PRN
  Administered 2014-04-08: 3 mg via INTRAVENOUS
  Administered 2014-04-08: 5 mg via INTRAVENOUS
  Administered 2014-04-08: 2 mg via INTRAVENOUS
  Administered 2014-04-08 (×2): 5 mg via INTRAVENOUS

## 2014-04-08 MED ORDER — SODIUM CHLORIDE 0.9 % IR SOLN
Status: DC | PRN
  Administered 2014-04-08: 1000 mL

## 2014-04-08 MED ORDER — MORPHINE SULFATE 2 MG/ML IJ SOLN
1.0000 mg | INTRAMUSCULAR | Status: AC | PRN
  Administered 2014-04-08 (×2): 2 mg via INTRAVENOUS
  Filled 2014-04-08 (×2): qty 1

## 2014-04-08 MED ORDER — SODIUM CHLORIDE 0.9 % IJ SOLN
3.0000 mL | INTRAMUSCULAR | Status: DC | PRN
Start: 2014-04-09 — End: 2014-04-12

## 2014-04-08 MED ORDER — SODIUM CHLORIDE 0.9 % IV SOLN: 250.0000 mL | INTRAVENOUS | Status: DC

## 2014-04-08 MED ORDER — LACTATED RINGERS IV SOLN
INTRAVENOUS | Status: DC | PRN
  Administered 2014-04-08: 08:00:00 via INTRAVENOUS

## 2014-04-08 MED ORDER — ASPIRIN EC 325 MG PO TBEC
325.0000 mg | DELAYED_RELEASE_TABLET | Freq: Every day | ORAL | Status: DC
  Administered 2014-04-09 – 2014-04-10 (×2): 325 mg via ORAL
  Filled 2014-04-08 (×2): qty 1

## 2014-04-08 MED ORDER — SODIUM CHLORIDE 0.9 % IV SOLN
250.0000 [IU] | INTRAVENOUS | Status: DC | PRN
  Administered 2014-04-08: 2.9 [IU]/h via INTRAVENOUS

## 2014-04-08 MED ORDER — LACTATED RINGERS IV SOLN
INTRAVENOUS | Status: DC | PRN
  Administered 2014-04-08 (×2): via INTRAVENOUS

## 2014-04-08 MED ORDER — SODIUM CHLORIDE 0.9 % IR SOLN
Status: DC | PRN
  Administered 2014-04-08: 3000 mL

## 2014-04-08 MED ORDER — MIDAZOLAM HCL 5 MG/5ML IJ SOLN
INTRAMUSCULAR | Status: DC | PRN
  Administered 2014-04-08 (×2): 3 mg via INTRAVENOUS
  Administered 2014-04-08 (×2): 2 mg via INTRAVENOUS

## 2014-04-08 MED ORDER — SODIUM CHLORIDE 0.9 % IV SOLN
INTRAVENOUS | Status: DC
  Administered 2014-04-08: 20 mL/h via INTRAVENOUS

## 2014-04-08 MED ORDER — SODIUM CHLORIDE 0.9 % IV SOLN
INTRAVENOUS | Status: AC
Start: 2014-04-08 — End: 2014-04-09
  Administered 2014-04-08: 100 mL/h via INTRAVENOUS

## 2014-04-08 MED ORDER — LEVOFLOXACIN IN D5W 750 MG/150ML IV SOLN
750.0000 mg | INTRAVENOUS | Status: AC
  Administered 2014-04-09: 750 mg via INTRAVENOUS
  Filled 2014-04-08: qty 150

## 2014-04-08 MED ORDER — OXYCODONE HCL 5 MG PO TABS
5.0000 mg | ORAL_TABLET | ORAL | Status: DC | PRN
  Administered 2014-04-10: 5 mg via ORAL
  Administered 2014-04-11: 10 mg via ORAL
  Filled 2014-04-08: qty 1
  Filled 2014-04-08: qty 2

## 2014-04-08 MED ORDER — ACETAMINOPHEN 160 MG/5ML PO SOLN: 650.0000 mg | Freq: Once | ORAL | Status: AC

## 2014-04-08 MED ORDER — ACETAMINOPHEN 160 MG/5ML PO SOLN
1000.0000 mg | Freq: Four times a day (QID) | ORAL | Status: DC
  Administered 2014-04-08: 1000 mg
  Filled 2014-04-08: qty 40.6

## 2014-04-08 MED ORDER — BISACODYL 5 MG PO TBEC
10.0000 mg | DELAYED_RELEASE_TABLET | Freq: Every day | ORAL | Status: DC
  Administered 2014-04-09 – 2014-04-12 (×4): 10 mg via ORAL
  Filled 2014-04-08 (×4): qty 2

## 2014-04-08 MED ORDER — INSULIN REGULAR BOLUS VIA INFUSION
0.0000 [IU] | Freq: Three times a day (TID) | INTRAVENOUS | Status: DC
  Filled 2014-04-08: qty 10

## 2014-04-08 MED ORDER — SODIUM CHLORIDE 0.9 % IV SOLN
10.0000 g | INTRAVENOUS | Status: DC | PRN
  Administered 2014-04-08: 5 g/h via INTRAVENOUS

## 2014-04-08 MED ORDER — NITROGLYCERIN IN D5W 200-5 MCG/ML-% IV SOLN
0.0000 ug/min | INTRAVENOUS | Status: DC
  Administered 2014-04-08: 0 ug/min via INTRAVENOUS

## 2014-04-08 MED ORDER — PROTAMINE SULFATE 10 MG/ML IV SOLN
INTRAVENOUS | Status: DC | PRN
  Administered 2014-04-08: 290 mg via INTRAVENOUS
  Administered 2014-04-08: 10 mg via INTRAVENOUS

## 2014-04-08 MED ORDER — PHENYLEPHRINE HCL 10 MG/ML IJ SOLN
0.0000 ug/min | INTRAVENOUS | Status: DC
  Administered 2014-04-09: 10 ug/min via INTRAVENOUS
  Filled 2014-04-08: qty 2

## 2014-04-08 MED ORDER — HEPARIN SODIUM (PORCINE) 1000 UNIT/ML IJ SOLN
INTRAMUSCULAR | Status: DC | PRN
Start: 2014-04-08 — End: 2014-04-08
  Administered 2014-04-08: 5 mL via INTRAVENOUS
  Administered 2014-04-08: 31 mL via INTRAVENOUS

## 2014-04-08 MED ORDER — PROPOFOL 10 MG/ML IV BOLUS
INTRAVENOUS | Status: DC | PRN
  Administered 2014-04-08: 20 mg via INTRAVENOUS

## 2014-04-08 MED ORDER — ONDANSETRON HCL 4 MG/2ML IJ SOLN
4.0000 mg | Freq: Four times a day (QID) | INTRAMUSCULAR | Status: DC | PRN
  Administered 2014-04-09 – 2014-04-11 (×4): 4 mg via INTRAVENOUS
  Filled 2014-04-08 (×4): qty 2

## 2014-04-08 MED ORDER — FAMOTIDINE IN NACL 20-0.9 MG/50ML-% IV SOLN
20.0000 mg | Freq: Two times a day (BID) | INTRAVENOUS | Status: AC
  Administered 2014-04-08 (×2): 20 mg via INTRAVENOUS
  Filled 2014-04-08: qty 50

## 2014-04-08 MED ORDER — LACTATED RINGERS IV SOLN
INTRAVENOUS | Status: DC | PRN
  Administered 2014-04-08 (×2): via INTRAVENOUS

## 2014-04-08 MED ORDER — MIDAZOLAM HCL 10 MG/2ML IJ SOLN
INTRAMUSCULAR | Status: AC
  Filled 2014-04-08: qty 2

## 2014-04-08 MED ORDER — ALBUMIN HUMAN 5 % IV SOLN
250.0000 mL | INTRAVENOUS | Status: AC | PRN
  Administered 2014-04-09 (×2): 250 mL via INTRAVENOUS

## 2014-04-08 MED ORDER — ALBUMIN HUMAN 5 % IV SOLN
INTRAVENOUS | Status: DC | PRN
  Administered 2014-04-08 (×2): via INTRAVENOUS

## 2014-04-08 MED ORDER — MAGNESIUM SULFATE 4000MG/100ML IJ SOLN
4.0000 g | Freq: Once | INTRAMUSCULAR | Status: AC
  Administered 2014-04-08: 4 g via INTRAVENOUS
  Filled 2014-04-08: qty 100

## 2014-04-08 MED ORDER — CHLORHEXIDINE GLUCONATE 4 % EX LIQD
30.0000 mL | CUTANEOUS | Status: DC
  Filled 2014-04-08: qty 30

## 2014-04-08 MED ORDER — LIDOCAINE HCL (CARDIAC) 20 MG/ML IV SOLN
INTRAVENOUS | Status: DC | PRN
  Administered 2014-04-08: 20 mg via INTRAVENOUS

## 2014-04-08 MED ORDER — BUPIVACAINE 0.5 % ON-Q PUMP SINGLE CATH 400 ML
400.0000 mL | INJECTION | Status: DC
  Filled 2014-04-08: qty 400

## 2014-04-08 MED ORDER — LACTATED RINGERS IV SOLN
INTRAVENOUS | Status: DC
  Administered 2014-04-08: 20 mL/h via INTRAVENOUS

## 2014-04-08 MED ORDER — VANCOMYCIN HCL IN DEXTROSE 1-5 GM/200ML-% IV SOLN
1000.0000 mg | Freq: Once | INTRAVENOUS | Status: AC
  Administered 2014-04-08: 1000 mg via INTRAVENOUS
  Filled 2014-04-08: qty 200

## 2014-04-08 MED ORDER — PHENYLEPHRINE HCL 10 MG/ML IJ SOLN
10.0000 mg | INTRAVENOUS | Status: DC | PRN
  Administered 2014-04-08: 15 ug/min via INTRAVENOUS

## 2014-04-08 MED ORDER — INSULIN ASPART 100 UNIT/ML ~~LOC~~ SOLN
0.0000 [IU] | SUBCUTANEOUS | Status: DC
Start: 2014-04-08 — End: 2014-04-10
  Administered 2014-04-08 – 2014-04-09 (×5): 2 [IU] via SUBCUTANEOUS

## 2014-04-08 MED ORDER — FENTANYL CITRATE 0.05 MG/ML IJ SOLN
INTRAMUSCULAR | Status: DC | PRN
  Administered 2014-04-08: 250 ug via INTRAVENOUS
  Administered 2014-04-08: 100 ug via INTRAVENOUS
  Administered 2014-04-08: 150 ug via INTRAVENOUS
  Administered 2014-04-08 (×4): 100 ug via INTRAVENOUS
  Administered 2014-04-08 (×2): 50 ug via INTRAVENOUS
  Administered 2014-04-08: 100 ug via INTRAVENOUS
  Administered 2014-04-08: 150 ug via INTRAVENOUS
  Administered 2014-04-08: 100 ug via INTRAVENOUS
  Administered 2014-04-08: 150 ug via INTRAVENOUS
  Administered 2014-04-08: 100 ug via INTRAVENOUS
  Administered 2014-04-08: 150 ug via INTRAVENOUS

## 2014-04-08 MED ORDER — ONDANSETRON HCL 4 MG/2ML IJ SOLN
INTRAMUSCULAR | Status: DC | PRN
  Administered 2014-04-08: 4 mg via INTRAVENOUS

## 2014-04-08 MED ORDER — SODIUM CHLORIDE 0.9 % IV SOLN
200.0000 ug | INTRAVENOUS | Status: DC | PRN
  Administered 2014-04-08: .3 ug/kg/h via INTRAVENOUS

## 2014-04-08 MED ORDER — METOPROLOL TARTRATE 1 MG/ML IV SOLN: 2.5000 mg | INTRAVENOUS | Status: DC | PRN

## 2014-04-08 MED ORDER — BISACODYL 10 MG RE SUPP: 10.0000 mg | Freq: Every day | RECTAL | Status: DC

## 2014-04-08 MED ORDER — PROPOFOL 10 MG/ML IV BOLUS
INTRAVENOUS | Status: AC
  Filled 2014-04-08: qty 20

## 2014-04-08 MED ORDER — MORPHINE SULFATE 2 MG/ML IJ SOLN
2.0000 mg | INTRAMUSCULAR | Status: DC | PRN
  Administered 2014-04-09 (×2): 2 mg via INTRAVENOUS
  Administered 2014-04-09: 4 mg via INTRAVENOUS
  Filled 2014-04-08: qty 2
  Filled 2014-04-08 (×2): qty 1

## 2014-04-08 MED ORDER — POTASSIUM CHLORIDE 10 MEQ/50ML IV SOLN
10.0000 meq | INTRAVENOUS | Status: AC
  Administered 2014-04-08 (×3): 10 meq via INTRAVENOUS

## 2014-04-08 MED ORDER — NITROGLYCERIN IN D5W 200-5 MCG/ML-% IV SOLN
INTRAVENOUS | Status: DC | PRN
  Administered 2014-04-08: 16.67 ug/min via INTRAVENOUS

## 2014-04-08 MED ORDER — SODIUM CHLORIDE 0.45 % IV SOLN
INTRAVENOUS | Status: DC
  Administered 2014-04-08: 20 mL/h via INTRAVENOUS

## 2014-04-08 MED ORDER — ASPIRIN 81 MG PO CHEW: 324.0000 mg | CHEWABLE_TABLET | Freq: Every day | ORAL | Status: DC

## 2014-04-08 MED ORDER — DEXMEDETOMIDINE HCL IN NACL 200 MCG/50ML IV SOLN
0.1000 ug/kg/h | INTRAVENOUS | Status: DC
  Administered 2014-04-08 (×2): 0.7 ug/kg/h via INTRAVENOUS
  Filled 2014-04-08: qty 50

## 2014-04-08 MED ORDER — PANTOPRAZOLE SODIUM 40 MG PO TBEC: 40.0000 mg | DELAYED_RELEASE_TABLET | Freq: Every day | ORAL | Status: DC

## 2014-04-08 SURGICAL SUPPLY — 103 items
ADAPTER CARDIO PERF ANTE/RETRO (ADAPTER) ×3 IMPLANT
BAG DECANTER FOR FLEXI CONT (MISCELLANEOUS) ×9 IMPLANT
BENZOIN TINCTURE PRP APPL 2/3 (GAUZE/BANDAGES/DRESSINGS) ×3 IMPLANT
BLADE SURG 11 STRL SS (BLADE) ×3 IMPLANT
CANISTER SUCTION 2500CC (MISCELLANEOUS) ×6 IMPLANT
CANNULA FEM VENOUS REMOTE 22FR (CANNULA) ×3 IMPLANT
CANNULA FEMORAL ART 14 SM (MISCELLANEOUS) ×3 IMPLANT
CANNULA GUNDRY RCSP 15FR (MISCELLANEOUS) ×3 IMPLANT
CANNULA OPTISITE PERFUSION 16F (CANNULA) IMPLANT
CANNULA OPTISITE PERFUSION 18F (CANNULA) ×3 IMPLANT
CARDIAC SUCTION (MISCELLANEOUS) ×3 IMPLANT
CATH KIT ON Q 5IN SLV (PAIN MANAGEMENT) IMPLANT
CATH KIT ON-Q SILVERSOAK 5IN (CATHETERS) ×3 IMPLANT
CONN ST 1/4X3/8  BEN (MISCELLANEOUS) ×2
CONN ST 1/4X3/8 BEN (MISCELLANEOUS) ×4 IMPLANT
CONT SPEC STER OR (MISCELLANEOUS) ×3 IMPLANT
COVER BACK TABLE 24X17X13 BIG (DRAPES) ×3 IMPLANT
COVER PROBE W GEL 5X96 (DRAPES) ×3 IMPLANT
COVER SURGICAL LIGHT HANDLE (MISCELLANEOUS) ×3 IMPLANT
CRADLE DONUT ADULT HEAD (MISCELLANEOUS) ×3 IMPLANT
DERMABOND ADVANCED (GAUZE/BANDAGES/DRESSINGS) ×2
DERMABOND ADVANCED .7 DNX12 (GAUZE/BANDAGES/DRESSINGS) ×4 IMPLANT
DEVICE PMI PUNCTURE CLOSURE (MISCELLANEOUS) ×3 IMPLANT
DEVICE SUT CK QUICK LOAD INDV (Prosthesis & Implant Heart) ×6 IMPLANT
DEVICE SUT CK QUICK LOAD MINI (Prosthesis & Implant Heart) ×9 IMPLANT
DEVICE TROCAR PUNCTURE CLOSURE (ENDOMECHANICALS) ×3 IMPLANT
DRAIN CHANNEL 28F RND 3/8 FF (WOUND CARE) ×6 IMPLANT
DRAPE BILATERAL SPLIT (DRAPES) ×3 IMPLANT
DRAPE C-ARM 42X72 X-RAY (DRAPES) ×3 IMPLANT
DRAPE CV SPLIT W-CLR ANES SCRN (DRAPES) ×3 IMPLANT
DRAPE INCISE IOBAN 66X45 STRL (DRAPES) ×12 IMPLANT
DRAPE SLUSH/WARMER DISC (DRAPES) ×3 IMPLANT
DRSG COVADERM 4X8 (GAUZE/BANDAGES/DRESSINGS) ×3 IMPLANT
ELECT BLADE 6.5 EXT (BLADE) ×3 IMPLANT
ELECT REM PT RETURN 9FT ADLT (ELECTROSURGICAL) ×6
ELECTRODE REM PT RTRN 9FT ADLT (ELECTROSURGICAL) ×4 IMPLANT
FEMORAL VENOUS CANN RAP (CANNULA) IMPLANT
GAUZE SPONGE 4X4 12PLY STRL (GAUZE/BANDAGES/DRESSINGS) ×3 IMPLANT
GLOVE BIO SURGEON STRL SZ 6 (GLOVE) ×21 IMPLANT
GLOVE BIO SURGEON STRL SZ 6.5 (GLOVE) ×6 IMPLANT
GLOVE BIO SURGEON STRL SZ7.5 (GLOVE) ×9 IMPLANT
GLOVE BIOGEL PI IND STRL 6 (GLOVE) ×8 IMPLANT
GLOVE BIOGEL PI INDICATOR 6 (GLOVE) ×4
GLOVE ORTHO TXT STRL SZ7.5 (GLOVE) ×9 IMPLANT
GOWN STRL REUS W/ TWL LRG LVL3 (GOWN DISPOSABLE) ×14 IMPLANT
GOWN STRL REUS W/TWL LRG LVL3 (GOWN DISPOSABLE) ×7
GUIDEWIRE ANG ZIPWIRE 038X150 (WIRE) IMPLANT
INSERT CONFORM CROSS CLAMP 66M (MISCELLANEOUS) IMPLANT
INSERT CONFORM CROSS CLAMP 86M (MISCELLANEOUS) IMPLANT
IV NS 1000ML (IV SOLUTION) ×2
IV NS 1000ML BAXH (IV SOLUTION) ×4 IMPLANT
KIT BASIN OR (CUSTOM PROCEDURE TRAY) ×3 IMPLANT
KIT DEVICE SUT COR-KNOT MIS 5 (INSTRUMENTS) ×3 IMPLANT
KIT DILATOR VASC 18G NDL (KITS) ×3 IMPLANT
KIT ROOM TURNOVER OR (KITS) ×3 IMPLANT
KIT SUCTION CATH 14FR (SUCTIONS) ×3 IMPLANT
LEAD PACING MYOCARDI (MISCELLANEOUS) ×3 IMPLANT
MARKER SKIN DUAL TIP RULER LAB (MISCELLANEOUS) ×3 IMPLANT
NEEDLE AORTIC ROOT 14G 7F (CATHETERS) ×3 IMPLANT
NS IRRIG 1000ML POUR BTL (IV SOLUTION) ×18 IMPLANT
PACK OPEN HEART (CUSTOM PROCEDURE TRAY) ×3 IMPLANT
PAD ARMBOARD 7.5X6 YLW CONV (MISCELLANEOUS) ×9 IMPLANT
PAD ELECT DEFIB RADIOL ZOLL (MISCELLANEOUS) ×3 IMPLANT
PATCH CORMATRIX 4CMX7CM (Prosthesis & Implant Heart) ×3 IMPLANT
RETRACTOR PVM SOFT TISSUE M (INSTRUMENTS) ×3 IMPLANT
RETRACTOR TRL SOFT TISSUE LG (INSTRUMENTS) IMPLANT
RETRACTOR TRM SOFT TISSUE 7.5 (INSTRUMENTS) IMPLANT
RING RECHORD MEMO 3D 34MM (Vascular Products) ×3 IMPLANT
SET CANNULATION TOURNIQUET (MISCELLANEOUS) ×3 IMPLANT
SET IRRIG TUBING LAPAROSCOPIC (IRRIGATION / IRRIGATOR) ×3 IMPLANT
SOLUTION ANTI FOG 6CC (MISCELLANEOUS) ×3 IMPLANT
SPONGE GAUZE 4X4 12PLY STER LF (GAUZE/BANDAGES/DRESSINGS) ×3 IMPLANT
SUCKER WEIGHTED FLEX (MISCELLANEOUS) ×6 IMPLANT
SUT BONE WAX W31G (SUTURE) ×3 IMPLANT
SUT E-PACK MINIMALLY INVASIVE (SUTURE) ×3 IMPLANT
SUT ETHIBOND (SUTURE) ×6 IMPLANT
SUT ETHIBOND 2 0 SH (SUTURE) ×3 IMPLANT
SUT ETHIBOND 2-0 RB-1 WHT (SUTURE) ×6 IMPLANT
SUT ETHIBOND X763 2 0 SH 1 (SUTURE) ×3 IMPLANT
SUT GORETEX CV 4 TH 22 36 (SUTURE) ×3 IMPLANT
SUT GORETEX CV-5THC-13 36IN (SUTURE) ×51 IMPLANT
SUT GORETEX CV4 TH-18 (SUTURE) ×24 IMPLANT
SUT MNCRL AB 3-0 PS2 18 (SUTURE) IMPLANT
SUT PROLENE 3 0 SH1 36 (SUTURE) ×15 IMPLANT
SUT PROLENE 4 0 RB 1 (SUTURE) ×9
SUT PROLENE 4-0 RB1 .5 CRCL 36 (SUTURE) ×18 IMPLANT
SUT SILK 2 0 SH CR/8 (SUTURE) IMPLANT
SUT SILK 3 0 SH CR/8 (SUTURE) IMPLANT
SUT VIC AB 2-0 CTX 36 (SUTURE) IMPLANT
SUT VIC AB 3-0 SH 8-18 (SUTURE) IMPLANT
SUT VICRYL 2 TP 1 (SUTURE) IMPLANT
SYRINGE 10CC LL (SYRINGE) ×3 IMPLANT
SYSTEM SAHARA CHEST DRAIN ATS (WOUND CARE) ×3 IMPLANT
TAPE CLOTH SURG 4X10 WHT LF (GAUZE/BANDAGES/DRESSINGS) ×3 IMPLANT
TOWEL OR 17X24 6PK STRL BLUE (TOWEL DISPOSABLE) ×6 IMPLANT
TOWEL OR 17X26 10 PK STRL BLUE (TOWEL DISPOSABLE) ×6 IMPLANT
TRAY FOLEY IC TEMP SENS 16FR (CATHETERS) ×3 IMPLANT
TROCAR XCEL BLADELESS 5X75MML (TROCAR) ×3 IMPLANT
TROCAR XCEL NON-BLD 11X100MML (ENDOMECHANICALS) ×6 IMPLANT
TUNNELER SHEATH ON-Q 11GX8 DSP (PAIN MANAGEMENT) ×3 IMPLANT
UNDERPAD 30X30 INCONTINENT (UNDERPADS AND DIAPERS) ×3 IMPLANT
WATER STERILE IRR 1000ML POUR (IV SOLUTION) ×6 IMPLANT
WIRE BENTSON .035X145CM (WIRE) ×3 IMPLANT

## 2014-04-08 NOTE — Progress Notes (Signed)
Recruitment done at this time per MD. PC 40, rate 10, 100%, +5 and I:E of 1:1. Patient responded well. SAT increased to 100%. RT will continue to monitor.

## 2014-04-08 NOTE — Op Note (Signed)
CARDIOTHORACIC SURGERY OPERATIVE NOTE  Date of Procedure:  04/08/2014  Preoperative Diagnosis: Severe Mitral Regurgitation  Postoperative Diagnosis: Same  Procedure:    Minimally-Invasive Mitral Valve Repair  Complex valvuloplasty including quadrangular resection of flail segment of posterior leaflet  Sliding leaflet plasty  Chordal transfer of tertiary chord from posterior annulus to free margin of P2  Gore-tex neocord placement x4  Sorin Memo 3D Rechord Ring Annuloplasty (size 9mm, catalog # [id number], serial # [id number])    Surgeon: Valentina Gu. 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 The patient's cardiac history dates back to 2004 when he presented with an acute ST segment elevation myocardial infarction involving the anterior wall which was treated with PCI and stenting with overlapping drug-eluting stent things in the left anterior descending coronary artery. The patient recovered uneventfully. At the time he was noted to have a heart murmur on exam, and echocardiograms revealed the presence of mitral valve prolapse with mild mitral regurgitation. He has been followed carefully ever since by a variety different cardiologist, most recently by Dr. Ellyn Hack. Recent followup transthoracic echocardiogram suggested significant increase in severity of mitral  regurgitation. This prompted transesophageal echocardiogram which was performed 02/06/2014 and confirmed the presence of a large flail segment of the posterior leaflet with severe (4+) mitral regurgitation. Left ventricular systolic function was mildly reduced with ejection fraction estimated 55-60%. He underwent left and right heart catheterization by Dr. Ellyn Hack on 03/05/2014. Coronary arteriography was notable for 40% stenosis of the proximal left anterior descending coronary artery with otherwise widely patent stents in the proximal and midportion of the LAD. There was no other significant flow limiting coronary artery disease. Pulmonary artery pressures were mildly elevated and there were large V waves on wedge tracings consistent with severe mitral regurgitation.  The patient has been seen in consultation and counseled at length regarding the indications, risks and potential benefits of surgery.  All questions have been answered, and the patient provides full informed consent for the operation as described.     DETAILS OF THE OPERATIVE PROCEDURE  Preparation:  The patient is brought to the operating room on the above mentioned date and central monitoring was established by the anesthesia team including placement of Swan-Ganz catheter through the left internal jugular vein.  A radial arterial line is placed. The patient is placed in the supine position on the operating table.  Intravenous antibiotics are administered. General endotracheal anesthesia is induced uneventfully. The patient is initially intubated using a dual lumen endotracheal tube.  A Foley catheter is placed.  Baseline transesophageal echocardiogram was performed.  Findings were notable for mitral valve prolapse with a large flail segment of the middle scallop (P2) of the posterior leaflet and severe (4+) mitral regurgitation. The left ventricle was mildly dilated. There was normal left ventricular systolic function. There was trivial  aortic insufficiency. No other significant abnormalities  were noted.  A soft roll is placed behind the patient's left scapula and the neck gently extended and turned to the left.   The patient's right neck, chest, abdomen, both groins, and both lower extremities are prepared and draped in a sterile manner. A time out procedure is performed.  Surgical Approach:  A right miniature anterolateral thoracotomy incision is performed. The incision is placed just lateral to and superior to the right nipple. The pectoralis major muscle is retracted medially and completely preserved. The right pleural space is entered through the 3rd intercostal space. A soft tissue retractor is placed.  Two 11 mm ports are placed through separate stab incisions inferiorly. The right pleural space is insufflated continuously with carbon dioxide gas through the posterior port during the remainder of the operation.  A pledgeted sutures placed through the dome of the right hemidiaphragm and retracted inferiorly to facilitate exposure.  A longitudinal incision is made in the pericardium 3 cm anterior to the phrenic nerve and silk traction sutures are placed on either side of the incision for exposure.   Extracorporeal Cardiopulmonary Bypass and Myocardial Protection:  A small incision is made in the right inguinal crease and the anterior surface of the right common femoral artery and right common femoral vein are identified.  The patient is placed in Trendelenburg position. The right internal jugular vein is cannulated with Seldinger technique and a guidewire advanced into the right atrium. The patient is heparinized systemically. The right internal jugular vein is cannulated with a 14 [place] pediatric femoral venous cannula. Pursestring sutures are placed on the anterior surface of the right common femoral vein and right common femoral artery. The right common femoral vein is cannulated with the Seldinger technique and a guidewire is  advanced under transesophageal echocardiogram guidance through the right atrium. The femoral vein is cannulated with a long 22 French femoral venous cannula. The right common femoral artery is cannulated with Seldinger technique and a flexible guidewire is advanced until it can be appreciated intraluminally in the descending thoracic aorta on transesophageal echocardiogram. The femoral artery is cannulated with an 18 French femoral arterial cannula.  Adequate heparinization is verified.     The entire pre-bypass portion of the operation was notable for stable hemodynamics.  Cardiopulmonary bypass was begun.  Vacuum assist venous drainage is utilized. The incision in the pericardium is extended in both directions. Venous drainage and exposure are notably excellent. A retrograde cardioplegia cannula is placed through the right atrium into the coronary sinus using transesophageal echocardiogram guidance.  An antegrade cardioplegia cannula is placed in the ascending aorta.    The patient is cooled to 28C systemic temperature.  The aortic cross clamp is applied and cold blood cardioplegia is delivered initially in an antegrade fashion through the aortic root.   Supplemental cardioplegia is given retrograde through the coronary sinus catheter. The initial cardioplegic arrest is rapid with early diastolic arrest.  Repeat doses of cardioplegia are administered intermittently every 20 to 30 minutes throughout the entire cross clamp portion of the operation through the aortic root and through the coronary sinus catheter in order to maintain completely flat electrocardiogram.  Myocardial protection was felt to be excellent.   Mitral Valve Repair:  A left atriotomy incision was performed through the interatrial groove and extended partially across the back wall of the left atrium after opening the oblique sinus inferiorly.  The mitral valve is exposed using a self-retaining retractor.  The mitral valve was inspected  and notable for forme  fruste variant of Barlow's disease.  There was a huge flail P2 with multiple ruptured primary cords and elongation of secondary cords. There was some billowing and prolapsed of the anterior leaflet as well, although this was relatively mild. There was mild calcification in the posterior annulus.  Interrupted 2-0 Ethibond horizontal mattress sutures are placed circumferentially around the entire mitral valve annulus. The sutures will ultimately be utilized for ring annuloplasty, and at this juncture there are utilized to suspend the valve symmetrically.  A pledgeted CV 4 Gore-Tex sutures placed through the head of the anterior papillary muscle and tied in a horizontal mattress fashion. A second CV 4 Gore-Tex sutures placed through the head of the anterior papillary muscle incorporating the pledgets from the first suture in a figure-of-eight fashion.  Similarly, another pledgeted CV 4 Gore-Tex sutures placed through the head of the posterior papillary muscle in a horizontal mattress fashion.  A fourth CV 4 Gore-Tex sutures placed through the head of the anterior papillary muscle incorporating the pledgets from the first suture in a figure-of-eight fashion.  The 8 limbs of these 4 sutures will later be utilized for neocord replacement.    The large flail segment (P2) of the posterior leaflet was repaired using a modest size quadrangular resection. Approximately 30% of the total surface area of P2 is resected.  A small sliding leaflet plasty is performed to facilitate shortening of the very tall P2 segment.  A relatively large tertiary cord attached to the posterior annulus is mobilized during the dissection for later chordal transfer.  The intervening defect in PT was repaired using interrupted everting CV 5 simple Gore-Tex sutures. At this juncture the valve was tested with saline and there appears to be of reasonably symmetrical line of coaptation and competent valve, although there lacked  adequate support for the free margin of P2.  The mobilized tertiary cord is reattached to the free margin of P2.  The individual limbs of the Goretex neocords were retrieved from the LV chamber, woven into the posterior leaflet beginning at the free margin where they were placed from the ventricular surface to the atrial surface, and then woven in a diamond shaped fashion towards the posterior mitral annulus.  The Goretex sutures were then tied while the LV was distended with saline so as to adjust the length of the neocords to the appropriate length.  The valve was tested with saline and appeared competent even without ring annuloplasty complete. The valve was sized to a 34 mm annuloplasty ring, based upon the transverse distance between the left and right commissures and the height of the anterior leaflet, corresponding to a size just slightly larger than the overall surface area of the anterior leaflet.  A Sorin Memo 3D annuloplasty ring (size 71mm, catalog [id number], serial [id number]) was secured in place uneventfully. All ring sutures were secured using a Cor-knot device.  The valve was tested with saline and appeared competent although there was some restriction of the posterior leaflet because 2 of the neocords were too short.  These were excised. The cleft between P1 and P2 was closed with interrupted everting 4-0 Prolene suture.  The valve is again tested with saline and appears to be perfectly competent with a broad symmetrical line of coaptation of the anterior and posterior leaflet. There is no residual leak. There was a broad, symmetrical line of coaptation of the anterior and posterior leaflet which was confirmed using the blue ink test.  Rewarming is begun.   Procedure Completion:  The  atriotomy was closed using a 2-layer closure of running 3-0 Prolene suture after placing a sump drain across the mitral valve to serve as a left ventricular vent.  One final dose of warm retrograde "hot shot"  cardioplegia was administered retrograde through the coronary sinus catheter while all air was evacuated through the aortic root.  The aortic cross clamp was removed after a total cross clamp time of 176 minutes.  Epicardial pacing wires are fixed to the inferior wall of the right ventricule and to the right atrial appendage. The patient is rewarmed to 37C temperature. The left ventricular vent is removed.  The patient is ventilated and flow volumes turndown while the mitral valve repair is inspected using transesophageal echocardiogram. The valve repair appears intact with no residual leak. The antegrade cardioplegia cannula is now removed. The patient is weaned and disconnected from cardiopulmonary bypass.  The patient's rhythm at separation from bypass was AV paced.  The patient was weaned from bypass without any inotropic support. Total cardiopulmonary bypass time for the operation was 224 minutes.  Followup transesophageal echocardiogram performed after separation from bypass revealed a well-seated annuloplasty ring in the mitral position with a normal functioning mitral valve. There was no residual leak.  Left ventricular function was unchanged from preoperatively.    The femoral arterial and venous cannulae were removed uneventfully. There was a palpable pulse in the distal right common femoral artery after removal of the cannula. Protamine was administered to reverse the anticoagulation. The right internal jugular cannula was removed and manual pressure held on the neck for 15 minutes.  Single lung ventilation was begun. The atriotomy closure was inspected for hemostasis. The pericardial sac was drained using a 28 French Bard drain placed through the anterior port incision.  The pericardium was closed using a patch of core matrix bovine submucosal tissue patch. The right pleural space is irrigated with saline solution and inspected for hemostasis. The right pleural space was drained using a 28 French  Bard drain placed through the posterior port incision. A single lumen On-Q Catheter is tunneled through the subcutaneous tissues to the posterior port incision and subsequently tunneled into the subpleural space posteriorly to cover the second through the sixth intercostal nerve roots. This catheter will ultimately be connected to a continuous infusion pump instilling 0.5% bupivacaine solution.  The miniature thoracotomy incision was closed in multiple layers in routine fashion. The right groin incision was inspected for hemostasis and closed in multiple layers in routine fashion.  The post-bypass portion of the operation was notable for stable rhythm and hemodynamics.  No blood products were administered during the operation.   Disposition:  The patient tolerated the procedure well.  The patient was reintubated using a single lumen endotracheal tube and subsequently transported to the surgical intensive care unit in stable condition. There were no intraoperative complications. All sponge instrument and needle counts are verified correct at completion of the operation.     Valentina Gu. Roxy Manns MD 04/08/2014 4:09 PM

## 2014-04-08 NOTE — Anesthesia Postprocedure Evaluation (Signed)
  Anesthesia Post-op Note  Patient: Alan [name]  Procedure(s) Performed: Procedure(s): MINIMALLY INVASIVE MITRAL VALVE REPAIR (MVR) (Right) INTRAOPERATIVE TRANSESOPHAGEAL ECHOCARDIOGRAM (N/A)  Patient Location: SICU  Anesthesia Type:General  Level of Consciousness: sedated and Patient remains intubated per anesthesia plan  Airway and Oxygen Therapy: Patient remains intubated per anesthesia plan and Patient placed on Ventilator (see vital sign flow sheet for setting)  Post-op Pain: none  Post-op Assessment: Post-op Vital signs reviewed, Patient's Cardiovascular Status Stable, Respiratory Function Stable and Pain level controlled, remains intubated and sedated  Post-op Vital Signs: Reviewed and stable  Last Vitals:  Filed Vitals:   04/08/14 1643  BP: 129/80  Pulse:   Temp:   Resp:     Complications: No apparent anesthesia complications

## 2014-04-08 NOTE — Anesthesia Procedure Notes (Signed)
Anesthesia Procedure Note PA catheter:  Routine monitors. Timeout, sterile prep, drape, FBP L neck.  Trendelenburg position.  1% [place]ido local, finder and trocar LIJ 2nd pass (1st pass unable to pass wire) with US guidance.  Cordis placed over J wire. PA catheter in easily.  Sterile dressing applied.  Patient tolerated well, VSS.  Jenita Seashore, MD  [phone]

## 2014-04-08 NOTE — Progress Notes (Signed)
CT surgery p.m. Rounds  Back from OR hemodynamically stable Chest x-ray reviewed, lines in good position Arterial PO2 low but should improve with adequate ICU ventilator Cardiac index greater than 2, not bleeding

## 2014-04-08 NOTE — Anesthesia Preprocedure Evaluation (Addendum)
Anesthesia Evaluation  Patient identified by MRN, date of birth, ID band Patient awake    Reviewed: Allergy & Precautions, H&P , NPO status , Patient's Chart, lab work & pertinent test results, reviewed documented beta blocker date and time   History of Anesthesia Complications (+) PONV and history of anesthetic complications  Airway Mallampati: II TM Distance: >3 FB Neck ROM: Full    Dental  (+) Teeth Intact, Dental Advisory Given   Pulmonary neg pulmonary ROS,  breath sounds clear to auscultation        Cardiovascular hypertension, Pt. on medications - angina+ CAD, + Past MI and + Cardiac Stents + Valvular Problems/Murmurs (flail P2 with ruptured chord) MR Rhythm:Regular Rate:Normal + Systolic murmurs 6/54         6/15 ECHO:  EF 55-60%, severe MR with flail P2 03/05/2014 cath:  Coronary arteriography was notable for 40% stenosis of the proximal left anterior descending coronary artery with otherwise widely patent stents in the proximal and midportion of the LAD. There was no other significant flow limiting coronary artery disease.    Neuro/Psych negative neurological ROS     GI/Hepatic Neg liver ROS, GERD-  Medicated and Controlled,  Endo/Other  negative endocrine ROS  Renal/GU negative Renal ROS     Musculoskeletal   Abdominal (+)  Abdomen: soft.    Peds  Hematology   Anesthesia Other Findings   Reproductive/Obstetrics                       Anesthesia Physical Anesthesia Plan  ASA: IV  Anesthesia Plan: General   Post-op Pain Management:    Induction: Intravenous  Airway Management Planned: Oral ETT and Double Lumen EBT  Additional Equipment: Arterial line, CVP, PA Cath, 3D TEE and Ultrasound Guidance Line Placement  Intra-op Plan:   Post-operative Plan: Post-operative intubation/ventilation  Informed Consent: I have reviewed the patients History and Physical, chart, labs  and discussed the procedure including the risks, benefits and alternatives for the proposed anesthesia with the patient or authorized representative who has indicated his/her understanding and acceptance.   Dental advisory given  Plan Discussed with: Anesthesiologist, Surgeon and CRNA  Anesthesia Plan Comments: (Plan routine monitors,  A-line, PA cath, GETA with DLT, TEE, and post op ventilation)       Anesthesia Quick Evaluation

## 2014-04-08 NOTE — Brief Op Note (Addendum)
04/08/2014  2:30 PM  PATIENT:  Alan [name]  58 y.o. male  PRE-OPERATIVE DIAGNOSIS:  mitral regurgitation  POST-OPERATIVE DIAGNOSIS:  mitral regurgitation  PROCEDURE:  Procedure(s):  MINIMALLY INVASIVE MITRAL VALVE REPAIR (MVR) (Right) -Complex Valvular Repair utilizing a 34 mm Sorin Memo 3D Reochord Ring Annuloplasty -Quadrangular resection with sliding plasty of the posterior leaflet (p2) -Suspension of Neochords x 4 -Chordal Transfer  INTRAOPERATIVE TRANSESOPHAGEAL ECHOCARDIOGRAM (N/A)  SURGEON:    Rexene Alberts, MD  ASSISTANTS:  Ellwood Handler, PA-C  ANESTHESIA:   Midge Minium, MD  CROSSCLAMP TIME:   176'  CARDIOPULMONARY BYPASS TIME: 224'  FINDINGS:  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  MV Stenosis: No mitral valve stenosis.  MV Disease Functional Class: MV Disease Functional Class: Type II.  Etiology (Choose at least one and up to five): Degenerative.  MV Lesions (Choose at least one): Leaflet prolapse, posterior. and Elongated/ruptured chords(s).    Mitral/Tricuspid/Pulmonary Valve Procedure  Mitral Valve Procedure Performed:  Repair: Annuloplasty., Leaflet Resection. Resection Type: Quadrangular. Mitral Leaflet Resection Location: Posterior., Sliding Plasty., Neochrods. Number of Neochords Inserted: 4 and Chordal/Leaflet Transfer.  Implant: Annuloplasty Device: Implant model number [id number], Size 34, Unique Device Identifier [id number].       COMPLICATIONS: None  BASELINE WEIGHT: 87 kg  PATIENT DISPOSITION:   TO SICU IN STABLE CONDITION  Tyreik Delahoussaye H 04/08/2014 4:01 PM

## 2014-04-08 NOTE — Interval H&P Note (Signed)
History and Physical Interval Note:  04/08/2014 6:22 AM  Alan [name]  has presented today for surgery, with the diagnosis of mitral regurgitation  The various methods of treatment have been discussed with the patient and family. After consideration of risks, benefits and other options for treatment, the patient has consented to  Procedure(s): MINIMALLY INVASIVE MITRAL VALVE REPAIR (MVR) (Right) INTRAOPERATIVE TRANSESOPHAGEAL ECHOCARDIOGRAM (N/A) as a surgical intervention .  The patient's history has been reviewed, patient examined, no change in status, stable for surgery.  I have reviewed the patient's chart and labs.  Questions were answered to the patient's satisfaction.     [name] H

## 2014-04-08 NOTE — Progress Notes (Signed)
Echocardiogram Echocardiogram Transesophageal has been performed.  Darlina Sicilian M 04/08/2014, 10:20 AM

## 2014-04-08 NOTE — Transfer of Care (Signed)
Immediate Anesthesia Transfer of Care Note  Patient: Alan [name]  Procedure(s) Performed: Procedure(s): MINIMALLY INVASIVE MITRAL VALVE REPAIR (MVR) (Right) INTRAOPERATIVE TRANSESOPHAGEAL ECHOCARDIOGRAM (N/A)  Patient Location: SICU  Anesthesia Type:General  Level of Consciousness: Patient remains intubated per anesthesia plan  Airway & Oxygen Therapy: Patient remains intubated per anesthesia plan  Post-op Assessment: Report given to PACU RN  Post vital signs: Reviewed and stable  Complications: No apparent anesthesia complications

## 2014-04-08 NOTE — OR Nursing (Signed)
45 min call made to SICU nurse  

## 2014-04-09 ENCOUNTER — Inpatient Hospital Stay (HOSPITAL_COMMUNITY): Payer: Federal, State, Local not specified - PPO

## 2014-04-09 LAB — MAGNESIUM
MAGNESIUM: 2.2 mg/dL (ref 1.5–2.5)
MAGNESIUM: 2.1 mg/dL (ref 1.5–2.5)

## 2014-04-09 LAB — CBC
HCT: 29.4 % — ABNORMAL LOW (ref 39.0–52.0)
HCT: 29.3 % — ABNORMAL LOW (ref 39.0–52.0)
HEMOGLOBIN: 10.4 g/dL — AB (ref 13.0–17.0)
Hemoglobin: 10.2 g/dL — ABNORMAL LOW (ref 13.0–17.0)
MCH: 32.7 pg (ref 26.0–34.0)
MCH: 32.8 pg (ref 26.0–34.0)
MCHC: 35.4 g/dL (ref 30.0–36.0)
MCHC: 34.8 g/dL (ref 30.0–36.0)
MCV: 92.5 fL (ref 78.0–100.0)
MCV: 94.2 fL (ref 78.0–100.0)
PLATELETS: 83 10*3/uL — AB (ref 150–400)
PLATELETS: 80 10*3/uL — AB (ref 150–400)
RBC: 3.18 MIL/uL — ABNORMAL LOW (ref 4.22–5.81)
RBC: 3.11 MIL/uL — ABNORMAL LOW (ref 4.22–5.81)
RDW: 12.6 % (ref 11.5–15.5)
RDW: 13.1 % (ref 11.5–15.5)
WBC: 8 10*3/uL (ref 4.0–10.5)
WBC: 8.4 10*3/uL (ref 4.0–10.5)

## 2014-04-09 LAB — POCT I-STAT 3, ART BLOOD GAS (G3+)
ACID-BASE DEFICIT: 1 mmol/L (ref 0.0–2.0)
Acid-base deficit: 5 mmol/L — ABNORMAL HIGH (ref 0.0–2.0)
BICARBONATE: 21 meq/L (ref 20.0–24.0)
Bicarbonate: 19.8 mEq/L — ABNORMAL LOW (ref 20.0–24.0)
O2 Saturation: 98 %
O2 Saturation: 95 %
TCO2: 21 mmol/L (ref 0–100)
TCO2: 22 mmol/L (ref 0–100)
pCO2 arterial: 22.5 mmHg — ABNORMAL LOW (ref 35.0–45.0)
pCO2 arterial: 42.1 mmHg (ref 35.0–45.0)
pH, Arterial: 7.556 — ABNORMAL HIGH (ref 7.350–7.450)
pH, Arterial: 7.311 — ABNORMAL LOW (ref 7.350–7.450)
pO2, Arterial: 86 mmHg (ref 80.0–100.0)
pO2, Arterial: 89 mmHg (ref 80.0–100.0)

## 2014-04-09 LAB — BASIC METABOLIC PANEL
ANION GAP: 11 (ref 5–15)
BUN: 13 mg/dL (ref 6–23)
CALCIUM: 7.2 mg/dL — AB (ref 8.4–10.5)
CHLORIDE: 112 meq/L (ref 96–112)
CO2: 21 meq/L (ref 19–32)
Creatinine, Ser: 0.74 mg/dL (ref 0.50–1.35)
GFR calc Af Amer: >90 mL/min (ref >90)
GFR calc non Af Amer: >90 mL/min (ref >90)
GLUCOSE: 131 mg/dL — AB (ref 70–99)
Potassium: 4 mEq/L (ref 3.7–5.3)
SODIUM: 144 meq/L (ref 137–147)

## 2014-04-09 LAB — GLUCOSE, CAPILLARY
GLUCOSE-CAPILLARY: 120 mg/dL — AB (ref 70–99)
Glucose-Capillary: 133 mg/dL — ABNORMAL HIGH (ref 70–99)
Glucose-Capillary: 127 mg/dL — ABNORMAL HIGH (ref 70–99)
Glucose-Capillary: 121 mg/dL — ABNORMAL HIGH (ref 70–99)
Glucose-Capillary: 126 mg/dL — ABNORMAL HIGH (ref 70–99)
Glucose-Capillary: 116 mg/dL — ABNORMAL HIGH (ref 70–99)

## 2014-04-09 LAB — POCT I-STAT, CHEM 8
BUN: 11 mg/dL (ref 6–23)
Calcium, Ion: 1.22 mmol/L (ref 1.12–1.23)
Chloride: 106 mEq/L (ref 96–112)
Creatinine, Ser: 0.6 mg/dL (ref 0.50–1.35)
Glucose, Bld: 133 mg/dL — ABNORMAL HIGH (ref 70–99)
HCT: 28 % — ABNORMAL LOW (ref 39.0–52.0)
Hemoglobin: 9.5 g/dL — ABNORMAL LOW (ref 13.0–17.0)
Potassium: 3.8 mEq/L (ref 3.7–5.3)
Sodium: 138 mEq/L (ref 137–147)
TCO2: 20 mmol/L (ref 0–100)

## 2014-04-09 LAB — CREATININE, SERUM
CREATININE: 0.64 mg/dL (ref 0.50–1.35)
GFR calc Af Amer: >90 mL/min (ref >90)

## 2014-04-09 MED ORDER — PANTOPRAZOLE SODIUM 40 MG PO TBEC
40.0000 mg | DELAYED_RELEASE_TABLET | Freq: Every day | ORAL | Status: DC
  Administered 2014-04-09 – 2014-04-12 (×4): 40 mg via ORAL
  Filled 2014-04-09 (×4): qty 1

## 2014-04-09 MED ORDER — POTASSIUM CHLORIDE CRYS ER 20 MEQ PO TBCR: 40.0000 meq | EXTENDED_RELEASE_TABLET | Freq: Once | ORAL | Status: DC

## 2014-04-09 MED ORDER — INSULIN ASPART 100 UNIT/ML ~~LOC~~ SOLN: 0.0000 [IU] | SUBCUTANEOUS | Status: DC

## 2014-04-09 MED ORDER — KETOROLAC TROMETHAMINE 15 MG/ML IJ SOLN
15.0000 mg | Freq: Four times a day (QID) | INTRAMUSCULAR | Status: AC
  Administered 2014-04-09 – 2014-04-10 (×5): 15 mg via INTRAVENOUS
  Filled 2014-04-09 (×5): qty 1

## 2014-04-09 MED ORDER — AMIODARONE HCL 200 MG PO TABS
200.0000 mg | ORAL_TABLET | Freq: Two times a day (BID) | ORAL | Status: DC
  Administered 2014-04-10 – 2014-04-12 (×5): 200 mg via ORAL
  Filled 2014-04-09 (×6): qty 1

## 2014-04-09 MED ORDER — FUROSEMIDE 10 MG/ML IJ SOLN
20.0000 mg | Freq: Four times a day (QID) | INTRAMUSCULAR | Status: AC
Start: 2014-04-09 — End: 2014-04-10
  Administered 2014-04-09 – 2014-04-10 (×3): 20 mg via INTRAVENOUS
  Filled 2014-04-09 (×3): qty 2

## 2014-04-09 MED ORDER — POTASSIUM CHLORIDE 10 MEQ/50ML IV SOLN
10.0000 meq | INTRAVENOUS | Status: AC
  Administered 2014-04-09 (×3): 10 meq via INTRAVENOUS

## 2014-04-09 MED ORDER — CETYLPYRIDINIUM CHLORIDE 0.05 % MT LIQD
7.0000 mL | Freq: Two times a day (BID) | OROMUCOSAL | Status: DC
  Administered 2014-04-09 – 2014-04-11 (×6): 7 mL via OROMUCOSAL

## 2014-04-09 MED ORDER — MORPHINE SULFATE 2 MG/ML IJ SOLN
2.0000 mg | INTRAMUSCULAR | Status: DC | PRN
  Administered 2014-04-09 – 2014-04-10 (×5): 2 mg via INTRAVENOUS
  Filled 2014-04-09 (×5): qty 1

## 2014-04-09 MED ORDER — CLOPIDOGREL BISULFATE 75 MG PO TABS
75.0000 mg | ORAL_TABLET | Freq: Every day | ORAL | Status: DC
  Administered 2014-04-10 – 2014-04-12 (×3): 75 mg via ORAL
  Filled 2014-04-09 (×3): qty 1

## 2014-04-09 MED ORDER — SODIUM CHLORIDE 0.9 % IV SOLN
INTRAVENOUS | Status: DC
  Administered 2014-04-09: 10 mL/h via INTRAVENOUS

## 2014-04-09 NOTE — Procedures (Signed)
Extubation Procedure Note  Patient Details:   Name: Alan [name] DOB: March 30, 1956 MRN: [id number]   Airway Documentation:  Airway 40 mm (Active)  Secured at (cm) 25 cm 04/08/2014  9:00 PM  Measured From Lips 04/08/2014  9:00 PM  Secured Location Right 04/08/2014  9:00 PM  Secured By Rana Snare Tape 04/08/2014  9:00 PM  Site Condition Dry;Cool 04/08/2014  4:40 PM     Airway (Active)    Evaluation  O2 sats: stable throughout Complications: No apparent complications Patient did tolerate procedure well. Bilateral Breath Sounds: Diminished;Clear  Patient extubated to 5 lpm nasal cannula.  VC 1.3L  NIF -26 positive cuff leak noted.  Patient able to vocalize post extubation.  Catha Brow 04/09/2014, 1:25 AM

## 2014-04-09 NOTE — Progress Notes (Signed)
Utilization Review Completed.  

## 2014-04-09 NOTE — Progress Notes (Addendum)
      [address]       [place] [address]             [phone]        CARDIOTHORACIC SURGERY PROGRESS NOTE   R1 Day Post-Op Procedure(s) (LRB): MINIMALLY INVASIVE MITRAL VALVE REPAIR (MVR) (Right) INTRAOPERATIVE TRANSESOPHAGEAL ECHOCARDIOGRAM (N/A)  Subjective: Looks good.  Mild soreness across middle of chest.  No SOB  Objective: Vital signs: BP Readings from Last 1 Encounters:  04/09/14 106/58   Pulse Readings from Last 1 Encounters:  04/09/14 80   Resp Readings from Last 1 Encounters:  04/09/14 10   Temp Readings from Last 1 Encounters:  04/09/14 99.1 F (37.3 C)     Hemodynamics: PAP: (19-32)/(10-19) 29/13 mmHg CO:  [4.1 L/min-9.2 L/min] 9.2 L/min CI:  [1.9 L/min/m2-109 L/min/m2] 4.2 L/min/m2  Physical Exam:  Rhythm:   Sinus 60's - AAI paced for rate  Breath sounds: clear  Heart sounds:  RRR w/out murmur  Incisions:  Dressings dry, intact  Abdomen:  Soft, non-distended, non-tender  Extremities:  Warm, well-perfused    Intake/Output from previous day: 08/26 0701 - 08/27 0700 In: 6975.6 [I.V.:4872.6; Blood:773; NG/GT:30; IV Piggyback:1300] Out: 7253 [Urine:3280; Emesis/NG output:100; Blood:1150; Chest Tube:500] Intake/Output this shift:    Lab Results:  CBC: Recent Labs  04/08/14 2300 04/09/14 0400  WBC 8.8 8.0  HGB 11.2* 10.4*  HCT 31.9* 29.4*  PLT 91* 83*    BMET:  Recent Labs  04/06/14 1319  04/08/14 2259 04/08/14 2300 04/09/14 0400  NA 139  < > 141  --  144  K 4.0  < > 3.9  --  4.0  CL 105  < > 110  --  112  CO2 20  --   --   --  21  GLUCOSE 94  < > 142*  --  131*  BUN 16  < > 13  --  13  CREATININE 0.70  < > 0.70 0.78 0.74  CALCIUM 9.3  --   --   --  7.2*  < > = values in this interval not displayed.   CBG (last 3)   Recent Labs  04/08/14 2102 04/08/14 2344 04/09/14 0404  GLUCAP 106* 138* 133*    ABG    Component Value Date/Time   PHART 7.311* 04/09/2014 0222   PCO2ART 42.1 04/09/2014 0222   PO2ART 89.0 04/09/2014 0222   HCO3 21.0 04/09/2014 0222   TCO2 22 04/09/2014 0222   ACIDBASEDEF 5.0* 04/09/2014 0222   O2SAT 95.0 04/09/2014 0222    CXR: Looks good.  Mild bibasilar atelectasis  Assessment/Plan: S/P Procedure(s) (LRB): MINIMALLY INVASIVE MITRAL VALVE REPAIR (MVR) (Right) INTRAOPERATIVE TRANSESOPHAGEAL ECHOCARDIOGRAM (N/A)  Doing well POD1 Maintaining NSR w/ stable hemodynamics on very low dose Neo drip for BP Expected post op acute blood loss anemia, mild, stable Expected post op volume excess Expected post op atelectasis, mild Post op thrombocytopenia, stable   Mobilize  Wean Neo drip as tolerated  D/C lines  Hold diuretics until BP stable off Neo drip  Restart Plavix tomorrow if platelet count stable  No coumadin since patient on dual anti-platelet therapy   Alan [name] H 04/09/2014 7:45 AM

## 2014-04-09 NOTE — Progress Notes (Signed)
Patient ID: Alan [name], male   DOB: 04-Nov-1955, 58 y.o.   MRN: [id number]  SICU Evening Rounds:  Hemodynamically stable. A-pacing at 80  Urine output ok  BMET    Component Value Date/Time   NA 138 04/09/2014 1602   K 3.8 04/09/2014 1602   CL 106 04/09/2014 1602   CO2 21 04/09/2014 0400   GLUCOSE 133* 04/09/2014 1602   BUN 11 04/09/2014 1602   CREATININE 0.60 04/09/2014 1602   CREATININE 0.80 02/27/2014 1152   CALCIUM 7.2* 04/09/2014 0400   GFRNONAA >90 04/09/2014 0400   GFRAA >90 04/09/2014 0400    CBC    Component Value Date/Time   WBC 8.4 04/09/2014 1610   RBC 3.11* 04/09/2014 1610   HGB 10.2* 04/09/2014 1610   HCT 29.3* 04/09/2014 1610   PLT 80* 04/09/2014 1610   MCV 94.2 04/09/2014 1610   MCH 32.8 04/09/2014 1610   MCHC 34.8 04/09/2014 1610   RDW 13.1 04/09/2014 1610   LYMPHSABS 0.5* 03/10/2014 0738   MONOABS 0.6 03/10/2014 0738   EOSABS 0.1 03/10/2014 0738   BASOSABS 0.0 03/10/2014 0738    A/P: stable. repleat K+

## 2014-04-10 ENCOUNTER — Inpatient Hospital Stay (HOSPITAL_COMMUNITY): Payer: Federal, State, Local not specified - PPO

## 2014-04-10 ENCOUNTER — Encounter (HOSPITAL_COMMUNITY): Payer: Self-pay | Admitting: Thoracic Surgery (Cardiothoracic Vascular Surgery)

## 2014-04-10 LAB — CBC
HCT: 29.6 % — ABNORMAL LOW (ref 39.0–52.0)
HEMOGLOBIN: 9.9 g/dL — AB (ref 13.0–17.0)
MCH: 32.2 pg (ref 26.0–34.0)
MCHC: 33.4 g/dL (ref 30.0–36.0)
MCV: 96.4 fL (ref 78.0–100.0)
Platelets: 69 10*3/uL — ABNORMAL LOW (ref 150–400)
RBC: 3.07 MIL/uL — AB (ref 4.22–5.81)
RDW: 13.2 % (ref 11.5–15.5)
WBC: 6.9 10*3/uL (ref 4.0–10.5)

## 2014-04-10 LAB — BASIC METABOLIC PANEL
Anion gap: 7 (ref 5–15)
BUN: 12 mg/dL (ref 6–23)
CHLORIDE: 105 meq/L (ref 96–112)
CO2: 28 mEq/L (ref 19–32)
Calcium: 8.3 mg/dL — ABNORMAL LOW (ref 8.4–10.5)
Creatinine, Ser: 0.73 mg/dL (ref 0.50–1.35)
GFR calc Af Amer: >90 mL/min (ref >90)
GFR calc non Af Amer: >90 mL/min (ref >90)
GLUCOSE: 111 mg/dL — AB (ref 70–99)
POTASSIUM: 3.4 meq/L — AB (ref 3.7–5.3)
Sodium: 140 mEq/L (ref 137–147)

## 2014-04-10 LAB — GLUCOSE, CAPILLARY
GLUCOSE-CAPILLARY: 116 mg/dL — AB (ref 70–99)
Glucose-Capillary: 114 mg/dL — ABNORMAL HIGH (ref 70–99)
Glucose-Capillary: 96 mg/dL (ref 70–99)

## 2014-04-10 MED ORDER — ASPIRIN EC 81 MG PO TBEC: 81.0000 mg | DELAYED_RELEASE_TABLET | Freq: Every day | ORAL | Status: DC

## 2014-04-10 MED ORDER — POTASSIUM CHLORIDE 10 MEQ/50ML IV SOLN
10.0000 meq | INTRAVENOUS | Status: AC
  Administered 2014-04-10 (×3): 10 meq via INTRAVENOUS
  Filled 2014-04-10 (×2): qty 50

## 2014-04-10 MED ORDER — POTASSIUM CHLORIDE 10 MEQ/50ML IV SOLN
10.0000 meq | INTRAVENOUS | Status: AC | PRN
  Administered 2014-04-10 (×3): 10 meq via INTRAVENOUS
  Filled 2014-04-10 (×2): qty 50

## 2014-04-10 MED ORDER — ALPRAZOLAM 0.25 MG PO TABS: 0.2500 mg | ORAL_TABLET | Freq: Four times a day (QID) | ORAL | Status: DC | PRN

## 2014-04-10 MED ORDER — POTASSIUM CHLORIDE CRYS ER 20 MEQ PO TBCR
20.0000 meq | EXTENDED_RELEASE_TABLET | Freq: Every day | ORAL | Status: DC
  Administered 2014-04-11 – 2014-04-12 (×2): 20 meq via ORAL
  Filled 2014-04-10 (×2): qty 1

## 2014-04-10 MED ORDER — ALPRAZOLAM 0.5 MG PO TABS
0.5000 mg | ORAL_TABLET | Freq: Every evening | ORAL | Status: DC | PRN
  Administered 2014-04-10 – 2014-04-11 (×2): 0.5 mg via ORAL
  Filled 2014-04-10 (×2): qty 1

## 2014-04-10 MED ORDER — LOSARTAN POTASSIUM 25 MG PO TABS
25.0000 mg | ORAL_TABLET | Freq: Every day | ORAL | Status: DC
  Administered 2014-04-11 – 2014-04-12 (×2): 25 mg via ORAL
  Filled 2014-04-10 (×2): qty 1

## 2014-04-10 MED ORDER — SODIUM CHLORIDE 0.9 % IV SOLN: 250.0000 mL | INTRAVENOUS | Status: DC | PRN

## 2014-04-10 MED ORDER — TRAMADOL HCL 50 MG PO TABS: 50.0000 mg | ORAL_TABLET | ORAL | Status: DC | PRN

## 2014-04-10 MED ORDER — SODIUM CHLORIDE 0.9 % IJ SOLN: 3.0000 mL | INTRAMUSCULAR | Status: DC | PRN

## 2014-04-10 MED ORDER — FUROSEMIDE 10 MG/ML IJ SOLN
20.0000 mg | Freq: Once | INTRAMUSCULAR | Status: AC
  Administered 2014-04-10: 20 mg via INTRAVENOUS
  Filled 2014-04-10: qty 2

## 2014-04-10 MED ORDER — MOVING RIGHT ALONG BOOK
Freq: Once | Status: AC
  Administered 2014-04-10: 11:00:00
  Filled 2014-04-10: qty 1

## 2014-04-10 MED ORDER — FUROSEMIDE 40 MG PO TABS
40.0000 mg | ORAL_TABLET | Freq: Every day | ORAL | Status: DC
  Administered 2014-04-11 – 2014-04-12 (×2): 40 mg via ORAL
  Filled 2014-04-10 (×2): qty 1

## 2014-04-10 MED ORDER — FUROSEMIDE 40 MG PO TABS: 40.0000 mg | ORAL_TABLET | Freq: Every day | ORAL | Status: DC

## 2014-04-10 MED ORDER — ASPIRIN EC 81 MG PO TBEC
81.0000 mg | DELAYED_RELEASE_TABLET | Freq: Every day | ORAL | Status: DC
  Administered 2014-04-11 – 2014-04-12 (×2): 81 mg via ORAL
  Filled 2014-04-10 (×2): qty 1

## 2014-04-10 MED ORDER — SODIUM CHLORIDE 0.9 % IJ SOLN
3.0000 mL | Freq: Two times a day (BID) | INTRAMUSCULAR | Status: DC
  Administered 2014-04-10 – 2014-04-12 (×5): 3 mL via INTRAVENOUS

## 2014-04-10 MED FILL — Heparin Sodium (Porcine) Inj 1000 Unit/ML: INTRAMUSCULAR | Qty: 20 | Status: AC

## 2014-04-10 MED FILL — Sodium Chloride IV Soln 0.9%: INTRAVENOUS | Qty: 2000 | Status: AC

## 2014-04-10 MED FILL — Mannitol IV Soln 20%: INTRAVENOUS | Qty: 500 | Status: AC

## 2014-04-10 MED FILL — Sodium Bicarbonate IV Soln 8.4%: INTRAVENOUS | Qty: 50 | Status: AC

## 2014-04-10 MED FILL — Heparin Sodium (Porcine) Inj 1000 Unit/ML: INTRAMUSCULAR | Qty: 30 | Status: AC

## 2014-04-10 MED FILL — Magnesium Sulfate Inj 50%: INTRAMUSCULAR | Qty: 10 | Status: AC

## 2014-04-10 MED FILL — Electrolyte-R (PH 7.4) Solution: INTRAVENOUS | Qty: 5000 | Status: AC

## 2014-04-10 MED FILL — Potassium Chloride Inj 2 mEq/ML: INTRAVENOUS | Qty: 40 | Status: AC

## 2014-04-10 MED FILL — Lidocaine HCl IV Inj 20 MG/ML: INTRAVENOUS | Qty: 5 | Status: AC

## 2014-04-10 MED FILL — Dexmedetomidine HCl IV Soln 200 MCG/2ML: INTRAVENOUS | Qty: 2 | Status: AC

## 2014-04-10 NOTE — Progress Notes (Signed)
Pt transferred to 2W23 with belongings. Pt ambulated 400 ft from ICU room to new room.  CCMD notified of transfer. VSS.  Wife notified of room change.

## 2014-04-10 NOTE — Progress Notes (Addendum)
      [address]       [place], [address]             [phone]        CARDIOTHORACIC SURGERY PROGRESS NOTE   R2 Days Post-Op Procedure(s) (LRB): MINIMALLY INVASIVE MITRAL VALVE REPAIR (MVR) (Right) INTRAOPERATIVE TRANSESOPHAGEAL ECHOCARDIOGRAM (N/A)  Subjective: Feeling well.  Minimal soreness.  Appetite improving.  Ambulated around SICU twice this morning  Objective: Vital signs: BP Readings from Last 1 Encounters:  04/10/14 123/77   Pulse Readings from Last 1 Encounters:  04/10/14 80   Resp Readings from Last 1 Encounters:  04/10/14 11   Temp Readings from Last 1 Encounters:  04/10/14 98.1 F (36.7 C) Oral    Hemodynamics:    Physical Exam:  Rhythm:   sinus  Breath sounds: clear  Heart sounds:  RRR w/out murmur  Incisions:  Clean and dry  Abdomen:  Soft, non-distended, non-tender  Extremities:  Warm, well-perfused    Intake/Output from previous day: 08/27 0701 - 08/28 0700 In: 705.2 [P.O.:100; I.V.:205.2; IV Piggyback:400] Out: 7169 [Urine:2910; Chest Tube:595] Intake/Output this shift: Total I/O In: 170 [P.O.:120; IV Piggyback:50] Out: 50 [Urine:50]  Lab Results:  CBC: Recent Labs  04/09/14 1610 04/10/14 0405  WBC 8.4 6.9  HGB 10.2* 9.9*  HCT 29.3* 29.6*  PLT 80* 69*    BMET:  Recent Labs  04/09/14 0400 04/09/14 1602 04/09/14 1610 04/10/14 0405  NA 144 138  --  140  K 4.0 3.8  --  3.4*  CL 112 106  --  105  CO2 21  --   --  28  GLUCOSE 131* 133*  --  111*  BUN 13 11  --  12  CREATININE 0.74 0.60 0.64 0.73  CALCIUM 7.2*  --   --  8.3*     CBG (last 3)   Recent Labs  04/09/14 2340 04/10/14 0350 04/10/14 0736  GLUCAP 120* 116* 114*    ABG    Component Value Date/Time   PHART 7.311* 04/09/2014 0222   PCO2ART 42.1 04/09/2014 0222   PO2ART 89.0 04/09/2014 0222   HCO3 21.0 04/09/2014 0222   TCO2 20 04/09/2014 1602   ACIDBASEDEF 5.0* 04/09/2014 0222   O2SAT 95.0 04/09/2014 0222    CXR: PORTABLE CHEST -  1 VIEW  COMPARISON: 04/09/2014  FINDINGS:  Tiny right apical pneumothorax is slightly smaller. Right chest tube  in unchanged position. Left internal jugular vascular sheath noted  with removal of Swan-Ganz central line. Heart size and vascular  pattern are normal. No consolidation or effusion. Stable mild  scarring left base.  IMPRESSION:  Approximately 4 mm right apical pneumothorax, smaller when compared  to prior study  Electronically Signed  By: Skipper Cliche M.D.  On: 04/10/2014 07:20    Assessment/Plan: S/P Procedure(s) (LRB): MINIMALLY INVASIVE MITRAL VALVE REPAIR (MVR) (Right) INTRAOPERATIVE TRANSESOPHAGEAL ECHOCARDIOGRAM (N/A)  Doing very well POD2 Maintaining NSR w/ stable BP Expected post op acute blood loss anemia, stabe Expected post op volume excess, mild, diuresing Expected post op atelectasis, mild   Mobilize  Diuresis  No coumadin since patient on dual antiplatelet therapy  Leave chest tubes one more day  Transfer step down  Possibly ready for d/c home 2-3 days   OWEN,CLARENCE H 04/10/2014 10:28 AM

## 2014-04-10 NOTE — Progress Notes (Signed)
Pt's HR with bursts of AFib with RVR. Upon assessment of patient, HR is back in SR 70-80s. BP 107/66. MD paged and orders received to give PM does of Lopressor and Amiodarone early. Will continue to monitor pt closely.

## 2014-04-11 ENCOUNTER — Inpatient Hospital Stay (HOSPITAL_COMMUNITY): Payer: Federal, State, Local not specified - PPO

## 2014-04-11 LAB — BASIC METABOLIC PANEL
Anion gap: 8 (ref 5–15)
BUN: 12 mg/dL (ref 6–23)
CHLORIDE: 103 meq/L (ref 96–112)
CO2: 30 mEq/L (ref 19–32)
Calcium: 8.5 mg/dL (ref 8.4–10.5)
Creatinine, Ser: 0.79 mg/dL (ref 0.50–1.35)
GFR calc Af Amer: >90 mL/min (ref >90)
GFR calc non Af Amer: >90 mL/min (ref >90)
Glucose, Bld: 99 mg/dL (ref 70–99)
Potassium: 3.9 mEq/L (ref 3.7–5.3)
SODIUM: 141 meq/L (ref 137–147)

## 2014-04-11 LAB — CBC
HEMATOCRIT: 29.5 % — AB (ref 39.0–52.0)
HEMOGLOBIN: 9.8 g/dL — AB (ref 13.0–17.0)
MCH: 31.4 pg (ref 26.0–34.0)
MCHC: 33.2 g/dL (ref 30.0–36.0)
MCV: 94.6 fL (ref 78.0–100.0)
Platelets: 80 10*3/uL — ABNORMAL LOW (ref 150–400)
RBC: 3.12 MIL/uL — AB (ref 4.22–5.81)
RDW: 12.9 % (ref 11.5–15.5)
WBC: 6.9 10*3/uL (ref 4.0–10.5)

## 2014-04-11 NOTE — Discharge Summary (Signed)
Physician Discharge Summary  Patient ID: Alan [name] MRN: [id number] DOB/AGE: 12-28-1955 58 y.o.  Admit date: 04/08/2014 Discharge date: 04/11/2014  Admission Diagnoses: Mitral regurgitation   Discharge Diagnoses:  Principal Problem:   S/P minimally invasive mitral valve repair Active Problems:   CAD S/P percutaneous coronary angioplasty - DES PCI to occluded LAD during anterior STEMI   Severe mitral regurgitation   Severre Mitral valve prolapse - severe prolapse of the posterior (P2) leaflet with severe MR   Essential hypertension  History of present illness: Patient is a 58 year old male from [place] with history of coronary artery disease and mitral valve prolapse with mitral regurgitation who has been referred for possible elective mitral valve repair. The patient's cardiac history dates back to 2004 when he presented with an acute ST segment elevation myocardial infarction involving the anterior wall which was treated with PCI and stenting with overlapping drug-eluting stent things in the left anterior descending coronary artery. The patient recovered uneventfully. At the time he was noted to have a heart murmur on exam, and echocardiograms revealed the presence of mitral valve prolapse with mild mitral regurgitation. He has been followed carefully ever since by a variety different cardiologist, most recently by Dr. Ellyn Hack. Recent followup transthoracic echocardiogram suggested significant increase in severity of mitral regurgitation. This prompted transesophageal echocardiogram which was performed 02/06/2014 and confirmed the presence of a large flail segment of the posterior leaflet with severe (4+) mitral regurgitation. Left ventricular systolic function was mildly reduced with ejection fraction estimated 55-60%. The patient was referred for elective surgical consultation. He was originally seen in consultation on 03/03/2014. Since then he underwent left and right heart  catheterization by Dr. Ellyn Hack on 03/05/2014. Coronary arteriography was notable for 40% stenosis of the proximal left anterior descending coronary artery with otherwise widely patent stents in the proximal and midportion of the LAD. There was no other significant flow limiting coronary artery disease. Pulmonary artery pressures were mildly elevated and there were large V waves on wedge tracings consistent with severe mitral regurgitation. Since then the patient underwent CT angiography of the aorta and its branches to establish whether or not he would be candidate for femoral cannulation for surgery.  The patient is married and lives in [place] with his wife. 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. He lives a reasonably active physical lifestyle without any particular limitations. He specifically denies any symptoms of exertional shortness of breath. He does admit to some exertional fatigue and occasional tachypalpitations. He denies any history of exertional chest discomfort. He specifically denies any history of resting shortness of breath, PND, orthopnea, or lower extremity edema. The procedure, risks, and benefits were discussed with the patient. He agreed to proceed. He was admitted this hospitalization for minimally invasive mitral valve repair.  Past Medical History   Diagnosis  Date   .  ST elevation myocardial infarction (STEMI) of anterior wall, subsequent episode of care  2004     he had a proximal LAD occlusion treated with 2 overlapping 3.5 x 1.8 mm Cypher DES stents.   Marland Kitchen  CAD S/P percutaneous coronary angioplasty  2004     which showed essentially normal LV size and function, moderately dilated left atrium, moderate mitral prolapse with mild to moderate regurgitation.   Marland Kitchen  History of stress test  12/2009     he walked 9 mins reaching 10 METS. There was an attenuation artifact in the inferior region but no ischemia or infaract, low risk.   Marland Kitchen  H/O echocardiogram   09/2011     which showed essentially normal LV size and function, moderately dilated left atrium, moderate mitral prolapse with mild to moderate regurgitation.   .  Mitral valve prolapse      Moderate - with moderate MR, noted February t 2013   .  Severe mitral regurgitation by prior echocardiogram  02/06/2014     TEE: Severe mitral regurgitation with a flail P2 segment and ruptured; Normal LV size & function, dilated LA.   Marland Kitchen  Hypertension    .  Dyslipidemia, goal LDL below 70    .  GERD (gastroesophageal reflux disease)    .  Incidental lung nodule, greater than or equal to 67mm  03/16/2014     Ground glass opacity RML noted on CT scan   .  PONV (postoperative nausea and vomiting)     Past Surgical History   Procedure  Laterality  Date   .  Cardiac catheterization   2004     he had a proximal LAD occlusion treated with 2 overlapping 3.5 x 1.8 mm Cypher DES stents.   .  Cardiac catheterization   2007     where he had widely patent LAD stents, 40% dostal LAD, 30% circumflex and 50-60% inferior bifurcation lesion in the PL with mild anteroapical hypokinesis.   Marland Kitchen  Anterior cruciate ligament repair   1993   .  Nm myoview ltd   May 2011     Walk 9 minutes, and 10 METs, diaphragmatic attenuation but no ischemia or infarction.   .  Transthoracic echocardiogram   February 2013     Mild L. the dilation (likely normal for size) normal function greater than 55% EF. Moderate LA dilation. Moderate mitral prolapse of posterior leaflet with mild to moderate mitral regurgitation. -- No significant change since 2010   .  Tee without cardioversion  N/A  02/06/2014     Procedure: TRANSESOPHAGEAL ECHOCARDIOGRAM (TEE); Surgeon: Pixie Casino, MD; Normal LV Size U& function - EF 55-60%, no regional WMA. MV P2 Leaflet is flail with ruptured chord with severe prolapse, anterior leaflet intact. Severe, eccentric anterior directed MR with dilated LA.   .  Colonoscopy      Family History   Problem  Relation  Age of  Onset   .  Hypertension  Mother    .  Heart Problems  Father      triple bypass 1989   .  Cancer  Paternal Grandmother      Pancreatic    Social History  History   Substance Use Topics   .  Smoking status:  Never Smoker   .  Smokeless tobacco:  Not on file   .  Alcohol Use:  Yes      Comment: social    Prior to Admission medications   Medication  Sig  Start Date  End Date  Taking?  Authorizing Provider   amiodarone (PACERONE) 200 MG tablet  Take 200 mg by mouth 2 (two) times daily. For 7 days  04/01/14   Yes  Historical Provider, MD   aspirin 81 MG tablet  Take 81 mg by mouth daily.    Yes  Historical Provider, MD   cholecalciferol (VITAMIN D) 1000 UNITS tablet  Take 1,000 Units by mouth daily.    Yes  Historical Provider, MD   clopidogrel (PLAVIX) 75 MG tablet  Take 75 mg by mouth daily.    Yes  Historical Provider, MD   Coenzyme Q10 (CO  Q 10) 100 MG CAPS  Take 100 mg by mouth daily.    Yes  Historical Provider, MD   losartan (COZAAR) 25 MG tablet  Take 25 mg by mouth daily.    Yes  Historical Provider, MD   Multiple Vitamins-Minerals (MULTIVITAMINS THER. W/MINERALS) TABS  Take 1 tablet by mouth daily.    Yes  Historical Provider, MD   Probiotic Product (PROBIOTIC DAILY PO)  Take 1 tablet by mouth daily.    Yes  Historical Provider, MD   ALPRAZolam Duanne Moron) 0.5 MG tablet  Take 1 tablet (0.5 mg total) by mouth at bedtime as needed for anxiety or sleep.  04/06/14    Rexene Alberts, MD    Allergies   Allergen  Reactions   .  Amoxicillin  Hives   .  Other  Hives     "CILLINS"   .  Penicillins  Hives      Discharged Condition: good  Hospital Course: The patient was admitted electively and on 04/08/2014 taken the operating room for minimally invasive mitral repair. CARDIOTHORACIC SURGERY OPERATIVE NOTE  Date of Procedure: 04/08/2014  Preoperative Diagnosis: Severe Mitral Regurgitation  Postoperative Diagnosis: Same  Procedure:  Minimally-Invasive Mitral Valve Repair Complex  valvuloplasty including quadrangular resection of flail segment of posterior leaflet  Sliding leaflet plasty  Chordal transfer of tertiary chord from posterior annulus to free margin of P2  Gore-tex neocord placement x4  Sorin Memo 3D Rechord Ring Annuloplasty (size 20mm, catalog # [id number], serial # [id number])  Surgeon: Valentina Gu. 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  Postoperative hospital course: The patient is done quite well. He has maintained stable hemodynamics. He was weaned from the ventilator without difficulty using standard protocols. He has remained neurologically intact. He has maintained normal sinus rhythm. He had some mild postoperative volume overload which has responded to diuretics. He has an expected acute blood loss anemia which is stable. He is not felt to require Coumadin as he is on antiplatelet therapy. He is tolerating gradually increasing activities using standard postoperative rehabilitation modalities. Incisions are healing well without evidence of infection. At time of discharge she was felt to be quite stable.  Consults: None  Treatments: surgery: As described above  Discharge Exam: Blood pressure 115/68, pulse 69, temperature 98.6 F (37 C), temperature source Oral, resp. rate 18, height 6\' 4"  (1.93 m), weight 197 lb 1.5 oz (89.4 kg), SpO2 96.00%. General appearance: alert, cooperative and no distress  Heart: regular rate and rhythm  Lungs: clear to auscultation bilaterally  Abdomen: benign exam  Extremities:  no edema  Wound: incis healing well   Disposition: 01-Home or Self Care   medications at time of discharge:   Medication List         ALPRAZolam 0.5 MG tablet  Commonly known as:  XANAX  Take 1 tablet (0.5 mg total) by mouth at bedtime as needed for anxiety or sleep.     amiodarone 200 MG tablet  Commonly known as:  PACERONE  Take 1 tablet (200 mg total) by mouth 2 (two) times  daily.     aspirin 81 MG tablet  Take 81 mg by mouth daily.     cholecalciferol 1000 UNITS tablet  Commonly known as:  VITAMIN D  Take 1,000 Units by mouth daily.     clopidogrel 75 MG tablet  Commonly known as:  PLAVIX  Take 75 mg by mouth daily.     Co Q 10 100 MG Caps  Take 100 mg by mouth daily.     losartan 25 MG tablet  Commonly known as:  COZAAR  Take 25 mg by mouth daily.     metoprolol tartrate 12.5 mg Tabs tablet  Commonly known as:  LOPRESSOR  Take 0.5 tablets (12.5 mg total) by mouth 2 (two) times daily.     multivitamins ther. w/minerals Tabs tablet  Take 1 tablet by mouth daily.     mupirocin ointment 2 %  Commonly known as:  BACTROBAN  Place 1 application into the nose 2 (two) times daily. BID for 5 days for Positive PCR     PROBIOTIC DAILY PO  Take 1 tablet by mouth daily.     traMADol 50 MG tablet  Commonly known as:  ULTRAM  Take 1-2 tablets (50-100 mg total) by mouth every 6 (six) hours as needed for moderate pain.            Follow-up Information   Follow up with Rexene Alberts, MD. (2 weeks-the office will contact you with this appointment. Please obtain a chest x-ray from [place] 1 hour prior to seeing the surgeon. [facility] is located in the same office complex.)    Specialty:  Cardiothoracic Surgery   Contact information:   [address] [place] [address] [phone]       Follow up with Leonie Man, MD. (Please arrange to see cardiologist in 2 weeks)    Specialty:  Cardiology   Contact information:   [address] [place] [address] [phone]       Follow up with [facility] Roslyn. (The office will contact you his appointment to see nurse for suture removal)       Signed: GOLD,WAYNE E 04/11/2014, 10:57 AM

## 2014-04-11 NOTE — Progress Notes (Signed)
Pt given IV Zofran at this time for nausea thought to be caused by Oxy; will cont. To monitor.

## 2014-04-11 NOTE — Progress Notes (Signed)
CARDIAC REHAB PHASE I   PRE:  Rate/Rhythm: 73 SR  BP:  Supine: 116/70 Sitting:   Standing:    SaO2: 92% RA  MODE:  Ambulation: 550 ft   POST:  Rate/Rhythm: 76 SR  BP:  Supine:   Sitting: 129/69  Standing:    SaO2: 97% RA  [id number] Patient tolerated ambulation well with assist x1 and pushing rolling walker, one standing rest break taken, c/o left sided pain, which passed when he burped. To chair after walk with legs elevated, chest tube intact. Reviewed post surgery instructions including restrictions, risk factor modification, and activity progression. Pt states he eats heart healthy and exercised regularly prior to surgery. Discussed Phase 2 cardiac rehab, but patient is not interested at this time unless his physician recommends it. Pt verbalizes understanding of instructions given. Pt would like a walker for home use, will let pt's RN know of request.    Sol Passer, MS, ACSM CCEP

## 2014-04-11 NOTE — Progress Notes (Addendum)
[address]       [place] [address]             [phone]      3 Days Post-Op Procedure(s) (LRB): MINIMALLY INVASIVE MITRAL VALVE REPAIR (MVR) (Right) INTRAOPERATIVE TRANSESOPHAGEAL ECHOCARDIOGRAM (N/A) Subjective: conts to feel better  Objective: Vital signs in last 24 hours: Temp:  [97.9 F (36.6 C)-98.8 F (37.1 C)] 98.6 F (37 C) (08/29 0556) Pulse Rate:  [68-82] 69 (08/29 0556) Cardiac Rhythm:  [-] Normal sinus rhythm;Heart block (08/28 1940) Resp:  [11-21] 18 (08/29 0556) BP: (107-128)/(64-79) 115/68 mmHg (08/29 0556) SpO2:  [96 %-100 %] 96 % (08/29 0556) Weight:  [197 lb 1.5 oz (89.4 kg)] 197 lb 1.5 oz (89.4 kg) (08/29 0556)  Hemodynamic parameters for last 24 hours:    Intake/Output from previous day: 08/28 0701 - 08/29 0700 In: 710 [P.O.:480; I.V.:30; IV Piggyback:200] Out: 1950 [Urine:1750; Chest Tube:200] Intake/Output this shift:    General appearance: alert, cooperative and no distress Heart: regular rate and rhythm and no murmur Lungs: mildly dim in right base Abdomen: benign Extremities: not edematous Wound: gressings clean and dry  Lab Results:  Recent Labs  04/10/14 0405 04/11/14 0420  WBC 6.9 6.9  HGB 9.9* 9.8*  HCT 29.6* 29.5*  PLT 69* 80*   BMET:  Recent Labs  04/10/14 0405 04/11/14 0420  NA 140 141  K 3.4* 3.9  CL 105 103  CO2 28 30  GLUCOSE 111* 99  BUN 12 12  CREATININE 0.73 0.79  CALCIUM 8.3* 8.5    PT/INR:  Recent Labs  04/08/14 1640  LABPROT 18.8*  INR 1.57*   ABG    Component Value Date/Time   PHART 7.311* 04/09/2014 0222   HCO3 21.0 04/09/2014 0222   TCO2 20 04/09/2014 1602   ACIDBASEDEF 5.0* 04/09/2014 0222   O2SAT 95.0 04/09/2014 0222   CBG (last 3)   Recent Labs  04/10/14 0350 04/10/14 0736 04/10/14 1203  GLUCAP 116* 114* 96    Meds Scheduled Meds: . acetaminophen  1,000 mg Oral 4 times per day  . amiodarone  200 mg Oral BID  . antiseptic oral rinse  7 mL Mouth Rinse  BID  . aspirin EC  81 mg Oral Daily  . bisacodyl  10 mg Oral Daily   Or  . bisacodyl  10 mg Rectal Daily  . clopidogrel  75 mg Oral Daily  . docusate sodium  200 mg Oral Daily  . furosemide  40 mg Oral Daily  . losartan  25 mg Oral Daily  . metoprolol tartrate  12.5 mg Oral BID  . mupirocin ointment  1 application Nasal BID  . pantoprazole  40 mg Oral Daily  . potassium chloride  20 mEq Oral Daily  . sodium chloride  3 mL Intravenous Q12H  . sodium chloride  3 mL Intravenous Q12H   Continuous Infusions: . sodium chloride 10 mL/hr at 04/10/14 0400   PRN Meds:.sodium chloride, ALPRAZolam, metoprolol, morphine injection, ondansetron (ZOFRAN) IV, oxyCODONE, sodium chloride, sodium chloride, traMADol  Xrays Dg Chest Port 1 View  04/10/2014   CLINICAL DATA:  Mitral valve repair  EXAM: PORTABLE CHEST - 1 VIEW  COMPARISON:  04/09/2014  FINDINGS: Tiny right apical pneumothorax is slightly smaller. Right chest tube in unchanged position. Left internal jugular vascular sheath noted with removal of Swan-Ganz central line. Heart size and vascular pattern are normal. No consolidation or effusion. Stable mild scarring left base.  IMPRESSION: Approximately  4 mm right apical pneumothorax, smaller when compared to prior study   Electronically Signed   By: Skipper Cliche M.D.   On: 04/10/2014 07:20    Assessment/Plan: S/P Procedure(s) (LRB): MINIMALLY INVASIVE MITRAL VALVE REPAIR (MVR) (Right) INTRAOPERATIVE TRANSESOPHAGEAL ECHOCARDIOGRAM (N/A) 1 very steady progress 2 d/c chest tube 3 labs stable 4 cont pulm toilet/rehab 5 gentle diuresis 6 poss d/c 1-2 days    LOS: 3 days    GOLD,WAYNE E 04/11/2014  I have seen and examined the patient and agree with the assessment and plan as outlined.  OWEN,CLARENCE H 04/11/2014 11:24 AM

## 2014-04-11 NOTE — Progress Notes (Signed)
Patient ambulated 550 feet with rolling walker. No difficulties and no complaints.

## 2014-04-11 NOTE — Discharge Instructions (Signed)
Mitral Valve Replacement/Repair, Care After Refer to this sheet in the next few weeks. These instructions provide you with information on caring for yourself after your procedure. Your health care provider may also give you specific instructions. Your treatment has been planned according to current medical practices, but problems sometimes occur. Call your health care provider if you have any problems or questions after your procedure.  HOME CARE INSTRUCTIONS   Take medicines only as directed by your health care provider.  Take your temperature every morning for the first 7 days after surgery. Write these down.  Weigh yourself every morning for at least 7 days after surgery. Write your weight down.  Take frequent naps or rest often throughout the day.  Avoid lifting more than 10 lb (4.5 kg) or pushing or pulling things with your arms for 6-8 weeks or as directed by your health care provider.  Avoid driving or airplane travel for 4-6 weeks after surgery or as directed. If you are riding in a car for an extended period, stop every 1-2 hours to stretch your legs.  Avoid crossing your legs.  Avoid climbing stairs and using the handrail to pull yourself up for the first 2-3 weeks after surgery.  Do not take baths for 2-4 weeks after surgery. Take showers . Pat incisions dry. Do not rub incisions with a washcloth or towel.  Return to work as directed by your health care provider.  Drink enough fluids to keep your urine clear or pale yellow.  Do not strain to have a bowel movement. Eat high-fiber foods if you become constipated. You may also take a medicine to help you have a bowel movement (laxative) as directed by your health care provider.  Resume sexual activity as directed by your health care provider. SEEK MEDICAL CARE IF:   You develop a skin rash.   Your weight is increasing each day over 2-3 days.  Your weight increases by 2 or more pounds (1 kg) in a single day.  You have a  fever. SEEK IMMEDIATE MEDICAL CARE IF:   You develop chest pain that is not coming from your incision.  You develop shortness of breath or difficulty breathing.  You have drainage, redness, swelling, or pain at your incision site.  You have pus coming from your incision.  You develop light-headedness. MAKE SURE YOU:  Understand these directions.  Will watch your condition.  Will get help right away if you are not doing well or get worse. Document Released: 02/17/2005 Document Revised: 12/15/2013 Document Reviewed: 12/31/2012 [facility] Patient Information 2015 [place], [place]. This information is not intended to replace advice given to you by your health care provider. Make sure you discuss any questions you have with your health care provider.

## 2014-04-12 ENCOUNTER — Inpatient Hospital Stay (HOSPITAL_COMMUNITY): Payer: Federal, State, Local not specified - PPO

## 2014-04-12 ENCOUNTER — Other Ambulatory Visit: Payer: Self-pay | Admitting: Thoracic Surgery (Cardiothoracic Vascular Surgery)

## 2014-04-12 MED ORDER — TRAMADOL HCL 50 MG PO TABS: 50.0000 mg | ORAL_TABLET | Freq: Four times a day (QID) | ORAL | Status: DC | PRN

## 2014-04-12 MED ORDER — METOPROLOL TARTRATE 12.5 MG HALF TABLET: 12.5000 mg | ORAL_TABLET | Freq: Two times a day (BID) | ORAL | Status: DC

## 2014-04-12 MED ORDER — AMIODARONE HCL 200 MG PO TABS: 200.0000 mg | ORAL_TABLET | Freq: Two times a day (BID) | ORAL | Status: DC

## 2014-04-12 NOTE — Progress Notes (Addendum)
[address]       [place] [address]             [phone]      4 Days Post-Op Procedure(s) (LRB): MINIMALLY INVASIVE MITRAL VALVE REPAIR (MVR) (Right) INTRAOPERATIVE TRANSESOPHAGEAL ECHOCARDIOGRAM (N/A) Subjective: Feels ok, no new isues  Objective: Vital signs in last 24 hours: Temp:  [98 F (36.7 C)-98.9 F (37.2 C)] 98 F (36.7 C) (08/30 0539) Pulse Rate:  [63-72] 65 (08/30 0539) Cardiac Rhythm:  [-] Normal sinus rhythm;Heart block (08/29 1950) Resp:  [18-20] 18 (08/30 0539) BP: (104-121)/(52-84) 121/77 mmHg (08/30 0539) SpO2:  [95 %-100 %] 100 % (08/30 0539) Weight:  [189 lb 2.5 oz (85.8 kg)] 189 lb 2.5 oz (85.8 kg) (08/30 0539)  Hemodynamic parameters for last 24 hours:    Intake/Output from previous day: 08/29 0701 - 08/30 0700 In: 480 [P.O.:480] Out: -  Intake/Output this shift:    General appearance: alert, cooperative and no distress Heart: regular rate and rhythm Lungs: clear to auscultation bilaterally Abdomen: benign exam Extremities: no edema Wound: incis healing well  Lab Results:  Recent Labs  04/10/14 0405 04/11/14 0420  WBC 6.9 6.9  HGB 9.9* 9.8*  HCT 29.6* 29.5*  PLT 69* 80*   BMET:  Recent Labs  04/10/14 0405 04/11/14 0420  NA 140 141  K 3.4* 3.9  CL 105 103  CO2 28 30  GLUCOSE 111* 99  BUN 12 12  CREATININE 0.73 0.79  CALCIUM 8.3* 8.5    PT/INR: No results found for this basename: LABPROT, INR,  in the last 72 hours ABG    Component Value Date/Time   PHART 7.311* 04/09/2014 0222   HCO3 21.0 04/09/2014 0222   TCO2 20 04/09/2014 1602   ACIDBASEDEF 5.0* 04/09/2014 0222   O2SAT 95.0 04/09/2014 0222   CBG (last 3)   Recent Labs  04/10/14 0350 04/10/14 0736 04/10/14 1203  GLUCAP 116* 114* 96    Meds Scheduled Meds: . acetaminophen  1,000 mg Oral 4 times per day  . amiodarone  200 mg Oral BID  . antiseptic oral rinse  7 mL Mouth Rinse BID  . aspirin EC  81 mg Oral Daily  . bisacodyl  10  mg Oral Daily   Or  . bisacodyl  10 mg Rectal Daily  . clopidogrel  75 mg Oral Daily  . docusate sodium  200 mg Oral Daily  . furosemide  40 mg Oral Daily  . losartan  25 mg Oral Daily  . metoprolol tartrate  12.5 mg Oral BID  . mupirocin ointment  1 application Nasal BID  . pantoprazole  40 mg Oral Daily  . potassium chloride  20 mEq Oral Daily  . sodium chloride  3 mL Intravenous Q12H  . sodium chloride  3 mL Intravenous Q12H   Continuous Infusions: . sodium chloride 10 mL/hr at 04/10/14 0400   PRN Meds:.sodium chloride, ALPRAZolam, ondansetron (ZOFRAN) IV, sodium chloride, sodium chloride, traMADol  Xrays Dg Chest 2 View  04/12/2014   CLINICAL DATA:  Status post mitral valve repair  EXAM: CHEST  2 VIEW  COMPARISON:  04/11/2014  FINDINGS: The previously seen tiny right pneumothorax is again identified and stable. Cardiac shadow is stable. Postsurgical changes are again noted. Some bibasilar atelectasis is seen. No focal confluent infiltrate is noted. No sizable effusion is seen.  IMPRESSION: Stable right pneumothorax.  Mild bibasilar atelectasis.   Electronically Signed   By: Linus Mako.D.  On: 04/12/2014 07:37   Dg Chest Port 1v Same Day  04/11/2014   CLINICAL DATA:  Chest tube removal.  EXAM: PORTABLE CHEST - 1 VIEW SAME DAY  COMPARISON:  Chest x-ray 04/10/2014.  FINDINGS: Previously noted right-sided chest tube has been removed. Trace right apical pneumothorax (less than 5% of the volume of the right hemithorax) similar to the prior examination prior to chest-tube removal. Previously noted left internal jugular catheter is also been removed. Lung volumes are low. There are minimal bibasilar opacities likely related to mild postoperative subsegmental atelectasis. No consolidative airspace disease. No pleural effusions. No evidence of pulmonary edema. Heart size is normal. Mediastinal contours are unremarkable. A cluster of faintly radiopaque wires are seen projecting over the lower  right hemithorax medially, presumably epicardial pacing wires. Postoperative changes of mitral annuloplasty are noted.  IMPRESSION: 1. Status post removal of right-sided chest tube with unchanged tiny right apical pneumothorax. 2. Low lung volumes with minimal bibasilar subsegmental atelectasis. 3. Postoperative changes and support apparatus, as above.   Electronically Signed   By: Vinnie Langton M.D.   On: 04/11/2014 14:21    Assessment/Plan: S/P Procedure(s) (LRB): MINIMALLY INVASIVE MITRAL VALVE REPAIR (MVR) (Right) INTRAOPERATIVE TRANSESOPHAGEAL ECHOCARDIOGRAM (N/A) Plan for discharge: see discharge orders D/c epw's   LOS: 4 days    GOLD,WAYNE E 04/12/2014  I have seen and examined the patient and agree with the assessment and plan as outlined.  D/C home today.  Follow up in office 2 weeks from tomorrow w/ CXR.  Instructions given.  OWEN,CLARENCE H 04/12/2014 10:34 AM

## 2014-04-12 NOTE — Progress Notes (Signed)
UR Completed.  Alan [name] Jane [phone] 04/12/2014  

## 2014-04-12 NOTE — Progress Notes (Signed)
1010 EPW discontinued per protocol. Patient tolerated well. Wires intact. No drainage noted. BP 116/69. Patient educated to remain in bed x 1 hour and verbalized understanding.

## 2014-04-15 ENCOUNTER — Telehealth: Payer: Self-pay | Admitting: Cardiology

## 2014-04-15 NOTE — Telephone Encounter (Signed)
Close encounter 

## 2014-04-17 ENCOUNTER — Ambulatory Visit (INDEPENDENT_AMBULATORY_CARE_PROVIDER_SITE_OTHER): Payer: Self-pay

## 2014-04-17 DIAGNOSIS — I059 Rheumatic mitral valve disease, unspecified: Secondary | ICD-10-CM

## 2014-04-17 DIAGNOSIS — Z4802 Encounter for removal of sutures: Secondary | ICD-10-CM

## 2014-04-17 NOTE — Progress Notes (Signed)
Pt was received in clinic today for removal of Sutures. Pt 's skin looks well. Healing, No draining, Mild pain. Pt tolerated removal well.

## 2014-04-23 ENCOUNTER — Other Ambulatory Visit: Payer: Self-pay | Admitting: Thoracic Surgery (Cardiothoracic Vascular Surgery)

## 2014-04-23 DIAGNOSIS — I059 Rheumatic mitral valve disease, unspecified: Secondary | ICD-10-CM

## 2014-04-27 ENCOUNTER — Ambulatory Visit (INDEPENDENT_AMBULATORY_CARE_PROVIDER_SITE_OTHER): Payer: Self-pay | Admitting: Thoracic Surgery (Cardiothoracic Vascular Surgery)

## 2014-04-27 ENCOUNTER — Ambulatory Visit
Admission: RE | Admit: 2014-04-27 | Discharge: 2014-04-27 | Disposition: A | Discharge status: Home / Self Care | Payer: Federal, State, Local not specified - PPO | Source: Ambulatory Visit | Attending: Thoracic Surgery (Cardiothoracic Vascular Surgery) | Admitting: Thoracic Surgery (Cardiothoracic Vascular Surgery)

## 2014-04-27 ENCOUNTER — Encounter: Payer: Self-pay | Admitting: Thoracic Surgery (Cardiothoracic Vascular Surgery)

## 2014-04-27 VITALS — BP 110/72 | HR 70 | Resp 16 | Ht 76.0 in | Wt 182.0 lb

## 2014-04-27 DIAGNOSIS — Z9889 Other specified postprocedural states: Secondary | ICD-10-CM

## 2014-04-27 DIAGNOSIS — I340 Nonrheumatic mitral (valve) insufficiency: Secondary | ICD-10-CM

## 2014-04-27 DIAGNOSIS — I059 Rheumatic mitral valve disease, unspecified: Secondary | ICD-10-CM

## 2014-04-27 DIAGNOSIS — I341 Nonrheumatic mitral (valve) prolapse: Secondary | ICD-10-CM

## 2014-04-27 MED ORDER — AMIODARONE HCL 200 MG PO TABS: 200.0000 mg | ORAL_TABLET | Freq: Every day | ORAL | Status: DC

## 2014-04-27 NOTE — Patient Instructions (Addendum)
The patient is to decrease their dose of Amiodarone to 200 mg daily until their current prescription runs out, then stop taking it altogether.  The patient may continue to gradually increase their physical activity as tolerated.  They should refrain from any heavy lifting or strenuous use of their arms and shoulders until at least 8 weeks from the time of their surgery, and they should avoid activities that cause increased pain in their chest on the side of their surgical incision.  Otherwise they may continue to increase their activities without any particular limitations.  The patient may return to driving an automobile as long as they are no longer requiring oral narcotic pain relievers during the daytime.  It would be wise to start driving only short distances during the daylight and gradually increase from there as they feel comfortable.

## 2014-04-27 NOTE — Progress Notes (Signed)
[address]       [place] [address]             [phone]     CARDIOTHORACIC SURGERY OFFICE NOTE  Referring Provider is Leonie Man, MD PCP is Donnie Coffin, MD   HPI:  Patient returns for routine followup status post minimally invasive mitral valve repair on 04/08/2014. His postoperative recovery in the hospital was entirely uncomplicated and he was discharged on the fourth postoperative day.  He remains on dual antiplatelet therapy because of his history of coronary artery disease with drug-eluting stent. For this reason he was not placed on anticoagulation using warfarin.  He returns to the office today for routine followup. He reports that he is doing exceptionally well. He no longer has any significant pain and he only took a total of 3 pain pills after leaving the hospital. He states that he still feels numbness along the right anterolateral chest wall related to his surgical incision. He is sleeping well. He has no shortness of breath. He has no tachypalpitations. He is walking every day and has been going to the gym for exercise. He is eager to start increasing his activity.   Current Outpatient Prescriptions  Medication Sig Dispense Refill  . ALPRAZolam (XANAX) 0.5 MG tablet Take 1 tablet (0.5 mg total) by mouth at bedtime as needed for anxiety or sleep.  5 tablet  0  . amiodarone (PACERONE) 200 MG tablet Take 1 tablet (200 mg total) by mouth 2 (two) times daily.  60 tablet  1  . aspirin 81 MG tablet Take 81 mg by mouth daily.       . cholecalciferol (VITAMIN D) 1000 UNITS tablet Take 1,000 Units by mouth daily.      . clopidogrel (PLAVIX) 75 MG tablet Take 75 mg by mouth daily.      . Coenzyme Q10 (CO Q 10) 100 MG CAPS Take 100 mg by mouth daily.       Marland Kitchen losartan (COZAAR) 25 MG tablet Take 25 mg by mouth daily.      . metoprolol tartrate (LOPRESSOR) 12.5 mg TABS tablet Take 0.5 tablets (12.5 mg total) by mouth 2 (two) times daily.  60 each  1  .  Multiple Vitamins-Minerals (MULTIVITAMINS THER. W/MINERALS) TABS Take 1 tablet by mouth daily.      . Probiotic Product (PROBIOTIC DAILY PO) Take 1 tablet by mouth daily.       No current facility-administered medications for this visit.      Physical Exam:   BP 110/72  Pulse 70  Resp 16  Ht 6\' 4"  (1.93 m)  Wt 182 lb (82.555 kg)  BMI 22.16 kg/m2  SpO2 98%  General:  Well-appearing  Chest:   clear  CV:   Regular rate and rhythm without murmur  Incisions:  Healing nicely  Abdomen:  Soft and nontender  Extremities:  Warm and well-perfused  Diagnostic Tests:  CHEST 2 VIEW  COMPARISON: PA and lateral chest of April 12, 2014  FINDINGS:  The lungs are well-expanded. The tiny right apical pneumothorax has  resolved. There is decreased conspicuity of coarse lung markings in  the mid and lower right lung. The cardiac silhouette is normal in  size. The mitral valve ring is visible. The pulmonary vascularity is  normal. There is no pleural effusion. No acute bony thoracic  abnormality is demonstrated.  IMPRESSION:  There is no evidence of CHF nor other acute cardiopulmonary  abnormality. Coarse  interstitial lung markings in and the mid and  lower right lung are resolving.  Electronically Signed  By: David Martinique  On: 04/27/2014 09:12    Impression:  Patient is doing very well approximately 3 weeks following minimally invasive mitral valve repair.  Plan:  I've instructed the patient to decrease his dose of amiodarone to 200 mg daily. When his current prescription runs out I would not plan to redo amiodarone as long as he does not have any arrhythmias. We have otherwise not made any changes to his current medications. I've encouraged patient to continue to gradually increase his physical activity without any particular limitations at this time. He may resume driving an automobile. He is ready to participate in the cardiac rehabilitation program. All of his questions been  addressed.    Valentina Gu. Roxy Manns, MD 04/27/2014 9:43 AM

## 2014-05-25 ENCOUNTER — Encounter: Payer: Self-pay | Admitting: Thoracic Surgery (Cardiothoracic Vascular Surgery)

## 2014-05-25 ENCOUNTER — Ambulatory Visit (INDEPENDENT_AMBULATORY_CARE_PROVIDER_SITE_OTHER): Payer: Self-pay | Admitting: Thoracic Surgery (Cardiothoracic Vascular Surgery)

## 2014-05-25 VITALS — BP 124/76 | HR 80 | Ht 76.0 in | Wt 182.0 lb

## 2014-05-25 DIAGNOSIS — I340 Nonrheumatic mitral (valve) insufficiency: Secondary | ICD-10-CM

## 2014-05-25 DIAGNOSIS — Z9889 Other specified postprocedural states: Secondary | ICD-10-CM

## 2014-05-25 DIAGNOSIS — I341 Nonrheumatic mitral (valve) prolapse: Secondary | ICD-10-CM

## 2014-05-25 DIAGNOSIS — R911 Solitary pulmonary nodule: Secondary | ICD-10-CM

## 2014-05-25 NOTE — Progress Notes (Signed)
[address]       [place] [address]             [phone]     CARDIOTHORACIC SURGERY OFFICE NOTE  Referring Provider is Leonie Man, MD PCP is Donnie Coffin, MD   HPI:  Patient returns for routine followup status post minimally invasive mitral valve repair on 04/08/2014. His postoperative recovery in the hospital was entirely uncomplicated and he was discharged on the fourth postoperative day. He remains on dual antiplatelet therapy because of his history of coronary artery disease with drug-eluting stent. For this reason he was not placed on anticoagulation using warfarin. He was last seen in our office on 04/27/2014 and he returns to the office today for routine followup now approximately 6 weeks post-op.  He has not yet been seen in followup by Dr. Ellyn Hack but he has an appointment to see him in the office tomorrow.  Patient states that he is doing exceptionally well. He notes that his exercise tolerance is already noticeably better than it was prior to surgery. He has been exercising regularly at least 3 days a week and he recently even began jogging. He has no shortness of breath. He has not had any chest pain or palpitations. He started playing golf. He has been back to work for a couple of weeks and overall he continues to do remarkably well. He has been under some stress and he has had some difficulty sleeping. Otherwise he has no complaints.    Current Outpatient Prescriptions  Medication Sig Dispense Refill  . amiodarone (PACERONE) 200 MG tablet Take 1 tablet (200 mg total) by mouth daily.  60 tablet  1  . aspirin 81 MG tablet Take 81 mg by mouth daily.       . cholecalciferol (VITAMIN D) 1000 UNITS tablet Take 1,000 Units by mouth daily.      . clopidogrel (PLAVIX) 75 MG tablet Take 75 mg by mouth daily.      . Coenzyme Q10 (CO Q 10) 100 MG CAPS Take 100 mg by mouth daily.       Marland Kitchen losartan (COZAAR) 25 MG tablet Take 25 mg by mouth daily.      .  metoprolol tartrate (LOPRESSOR) 12.5 mg TABS tablet Take 0.5 tablets (12.5 mg total) by mouth 2 (two) times daily.  60 each  1  . Multiple Vitamins-Minerals (MULTIVITAMINS THER. W/MINERALS) TABS Take 1 tablet by mouth daily.      . Probiotic Product (PROBIOTIC DAILY PO) Take 1 tablet by mouth daily.      Marland Kitchen ALPRAZolam (XANAX) 0.5 MG tablet Take 1 tablet (0.5 mg total) by mouth at bedtime as needed for anxiety or sleep.  5 tablet  0   No current facility-administered medications for this visit.      Physical Exam:   BP 124/76  Pulse 80  Ht 6\' 4"  (1.93 m)  Wt 182 lb (82.555 kg)  BMI 22.16 kg/m2  SpO2 94%  General:  Well-appearing  Chest:   Clear  CV:   Regular rate and rhythm without murmur  Incisions:  Healing nicely  Abdomen:  Soft nontender  Extremities:  Warm and well-perfused  Diagnostic Tests:  n/a   Impression:  Patient is doing exceptionally well approximately 6 weeks following minimally invasive mitral valve repair.   Plan:  I have instructed the patient to stop taking amiodarone. We have not made any other changes to his current medications. I've encouraged him to  continue to increase his activity without any particular limitations at this time. At some point he will need a routine followup echocardiogram to establish a new baseline for left ventricular ejection fraction.  The patient has been reminded regarding the life long need for antibiotic prophylaxis for all dental cleaning and related procedures.  We will plan to see him back in 6 months with chest CT scan to followup the small lung nodule noted on CT scan performed immediately prior to surgery.    Valentina Gu. Roxy Manns, MD 05/25/2014 2:00 PM

## 2014-05-25 NOTE — Patient Instructions (Signed)
Patient has been instructed to stop taking amiodarone.  Patient may resume normal physical activity without any particular limitations at this time.  Endocarditis is a potentially serious infection of heart valves or inside lining of the heart.  It occurs more commonly in patients with diseased heart valves (such as patient's with aortic or mitral valve disease) and in patients who have undergone heart valve repair or replacement.  Certain surgical and dental procedures may put you at risk, such as dental cleaning, other dental procedures, or any surgery involving the respiratory, urinary, gastrointestinal tract, gallbladder or prostate gland.   To minimize your chances for develooping endocarditis, maintain good oral health and seek prompt medical attention for any infections involving the mouth, teeth, gums, skin or urinary tract.  Always notify your doctor or dentist about your underlying heart valve condition before having any invasive procedures. You will need to take antibiotics before certain procedures.

## 2014-05-26 ENCOUNTER — Encounter: Payer: Self-pay | Admitting: Cardiology

## 2014-05-26 ENCOUNTER — Ambulatory Visit (INDEPENDENT_AMBULATORY_CARE_PROVIDER_SITE_OTHER): Payer: Federal, State, Local not specified - PPO | Admitting: Cardiology

## 2014-05-26 VITALS — BP 100/60 | HR 76 | Ht 76.0 in | Wt 192.0 lb

## 2014-05-26 DIAGNOSIS — I2510 Atherosclerotic heart disease of native coronary artery without angina pectoris: Secondary | ICD-10-CM

## 2014-05-26 DIAGNOSIS — Z9889 Other specified postprocedural states: Secondary | ICD-10-CM

## 2014-05-26 DIAGNOSIS — E785 Hyperlipidemia, unspecified: Secondary | ICD-10-CM

## 2014-05-26 DIAGNOSIS — I340 Nonrheumatic mitral (valve) insufficiency: Secondary | ICD-10-CM

## 2014-05-26 DIAGNOSIS — I341 Nonrheumatic mitral (valve) prolapse: Secondary | ICD-10-CM

## 2014-05-26 DIAGNOSIS — I10 Essential (primary) hypertension: Secondary | ICD-10-CM

## 2014-05-26 DIAGNOSIS — Z9861 Coronary angioplasty status: Secondary | ICD-10-CM

## 2014-05-26 MED ORDER — ZOLPIDEM TARTRATE 5 MG PO TABS: 5.0000 mg | ORAL_TABLET | Freq: Every evening | ORAL | Status: DC | PRN

## 2014-05-26 NOTE — Patient Instructions (Signed)
STOP LOSARTAN   MAY USE AMBIEN (ZOLIPDEM) AT BEDTIME IF NEED FOR SLEEP.  6 month before next office visit. Your physician has requested that you have an echocardiogram. Echocardiography is a painless test that uses sound waves to create images of your heart. It provides your doctor with information about the size and shape of your heart and how well your heart's chambers and valves are working. This procedure takes approximately one hour. There are no restrictions for this procedure.   Your physician wants you to follow-up in 6 month Dr Ellyn Hack. 30 MIN APPT.  You will receive a reminder letter in the mail two months in advance. If you don't receive a letter, please call our office to schedule the follow-up appointment.

## 2014-05-28 ENCOUNTER — Encounter: Payer: Self-pay | Admitting: Cardiology

## 2014-05-28 NOTE — Assessment & Plan Note (Signed)
If anything he is relatively hypotensive. We have started him on Cozaar preoperatively. I think we can stop it now to avoid excessive hypotension.

## 2014-05-28 NOTE — Assessment & Plan Note (Signed)
Followup by PCP. By report his control is inadequate. Not on statins due to myalgias.

## 2014-05-28 NOTE — Assessment & Plan Note (Signed)
He had preserved ejection fraction with severe MR preoperatively.  Repeat echocardiogram should show preserved ejection fraction with normal bowel function. In fact he EF should increase.

## 2014-05-28 NOTE — Assessment & Plan Note (Signed)
Remains asymptomatic. On low-dose beta blocker. He is very active and has not had anginal symptoms. Preop cath showed minimal CAD with widely patent LAD stent. He is not on statin, but has been relatively well controlled based on his PCPs evaluation.

## 2014-05-28 NOTE — Assessment & Plan Note (Signed)
Doing very well postoperatively. No arrhythmias.  Plan: Recheck 2-D echocardiogram prior to followup visit in 6 months

## 2014-05-28 NOTE — Progress Notes (Signed)
PCP: Donnie Coffin, MD  Clinic Note: Chief Complaint  Patient presents with  . Follow-up    post valve replacement; no complaints    HPI: Alan [name] is a 58 y.o. male with a PMH below who presents today for his initial cardiology evaluation status post minimally invasive mitral valve repair on 04/08/2014.  He had relatively uncomplicated postop course except for mild A. fib which was treated with amiodarone. He was discharged on postop day 4. He was not started on warfarin due to his Plavix which was restarted postoperatively. He saw Dr. Roxy Manns yesterday and was doing very well with increased exercise tolerance, and finally back to playing golf and jogging. His main concern is difficulty sleeping. Amiodarone was stopped yesterday..  Past Medical History  Diagnosis Date  . ST elevation myocardial infarction (STEMI) of anterior wall, subsequent episode of care 2004    he had a proximal LAD occlusion treated with 2 overlapping 3.5 x 1.8 mm Cypher DES stents.  Marland Kitchen CAD S/P percutaneous coronary angioplasty 2004    which showed essentially normal LV size and function, moderately dilated left atrium, moderate mitral prolapse with mild to moderate regurgitation.  Marland Kitchen History of stress test 12/2009    he walked 9 mins reaching 10 METS. There was an attenuation artifact in the inferior region but no ischemia or infaract, low risk.  . H/O echocardiogram 09/2011    which showed essentially normal LV size and function, moderately dilated left atrium, moderate mitral prolapse with mild to moderate regurgitation.  Marland Kitchen History of Mitral valve prolapse     Moderate - with moderate MR, noted February t 2013  . History of Severe mitral regurgitation by prior echocardiogram 02/06/2014    TEE: Severe mitral regurgitation with a flail P2 segment and ruptured; Normal LV size & function, dilated LA.  . 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 Dyslipidemia, goal LDL below 70   . Essential hypertension   . Incidental lung nodule, greater than or equal to 34mm 03/16/2014    Ground glass opacity RML noted on CT scan  . PONV (postoperative nausea and vomiting)   . GERD (gastroesophageal reflux disease)    Interval History: He does not that he is feeling much better. He is very pleased at how rapidly he recovered. The main problem he had the surgery was throat soreness from the intubation and extubation process. Overall he is more cheerful today than normal energy level his exercise tolerance seems to be better in that he is getting back to the level of exercise he was doing. He is old shape.  He's not had any resting or exertional chest tightness or pressure or dyspnea. No heart failure symptoms of PND, orthopnea or edema. Musculoskeletal pain from his surgery at all since resolved to the point that he is able to swing a [employer]. He denies any rapid or irregular heartbeats/palpitations. No lightheadedness no dizziness, syncope or near syncope, TIA/amaurosis fugax symptoms.  No melena, hematochezia, hematuria, or epstaxis. No claudication.  ROS: A comprehensive was performed. Review of Systems  Constitutional:       Increased energy level  HENT: Positive for congestion. Negative for nosebleeds.   Respiratory: Negative for cough, shortness of breath and wheezing.   Cardiovascular: Negative  for claudication and leg swelling.  Gastrointestinal: Negative for constipation, blood in stool and melena.  Genitourinary: Negative for frequency and hematuria.  Musculoskeletal: Negative.  Negative for myalgias.  Neurological: Negative for dizziness, sensory change, speech change, focal weakness, seizures, loss of consciousness and headaches.    Endo/Heme/Allergies: Does not bruise/bleed easily.  Psychiatric/Behavioral: Negative for depression. The patient has insomnia.         Mostly having a hard time falling asleep.    Current Outpatient Prescriptions on File Prior to Visit  Medication Sig Dispense Refill  . aspirin 81 MG tablet Take 81 mg by mouth daily.       . cholecalciferol (VITAMIN D) 1000 UNITS tablet Take 1,000 Units by mouth daily.      . clopidogrel (PLAVIX) 75 MG tablet Take 75 mg by mouth daily.      . Coenzyme Q10 (CO Q 10) 100 MG CAPS Take 100 mg by mouth daily.       . metoprolol tartrate (LOPRESSOR) 12.5 mg TABS tablet Take 0.5 tablets (12.5 mg total) by mouth 2 (two) times daily.  60 each  1  . Multiple Vitamins-Minerals (MULTIVITAMINS THER. W/MINERALS) TABS Take 1 tablet by mouth daily.      . Probiotic Product (PROBIOTIC DAILY PO) Take 1 tablet by mouth daily.       No current facility-administered medications on file prior to visit.   ALLERGIES REVIEWED IN EPIC -- no change SOCIAL AND FAMILY HISTORY REVIEWED IN EPIC -- no change  Wt Readings from Last 3 Encounters:  05/26/14 192 lb (87.091 kg)  05/25/14 182 lb (82.555 kg)  04/27/14 182 lb (82.555 kg)   different scales  PHYSICAL EXAM BP 100/60  Pulse 76  Ht 6\' 4"  (1.93 m)  Wt 192 lb (87.091 kg)  BMI 23.38 kg/m2 General appearance: alert, cooperative, appears stated age, no distress; healthy appearing well-nourished and well-groomed. Neck: no adenopathy, no carotid bruit and no JVD HEENT: [place]/AT, EOMI, MMM, anicteric sclera Lungs: CTAB, normal percussion bilaterally and non-labored Heart: RRR, S1& S2 normal, no murmur, click, rub or gallop; nondisplaced PMI Abdomen: soft, non-tender; bowel sounds normal; no masses,  no organomegaly;  Extremities: extremities normal, atraumatic, no cyanosis, and edema  Pulses: 2+ and symmetric;  Neurologic: Mental status: Alert, oriented, thought content appropriate Cranial nerves: normal (II-XII grossly  intact)    Adult ECG Report  Rate: 76 ;  Rhythm: normal sinus rhythm  Narrative Interpretation: Normal EKG  Recent Labs:  No new labs available.    ASSESSMENT / PLAN: Alan [name] is progressing very well postoperatively from his mitral valve repair. He is not having any untoward symptoms. No arrhythmias, no significant pain. Certainly no heart failure symptoms. As expected with a healthy gentleman known to the surgery he has recovered very well.   He will need a reevaluation of his EF and valve function prior to his followup visit.  S/P minimally invasive mitral valve repair Doing very well postoperatively. No arrhythmias.  Plan: Recheck 2-D echocardiogram prior to followup visit in 6 months  History of Severe Mitral valve prolapse - severe prolapse of the posterior (P2) leaflet with severe MR He had preserved ejection fraction with severe MR preoperatively.  Repeat echocardiogram should show preserved ejection fraction with normal bowel function. In fact he EF should increase.  Essential hypertension If anything he is relatively hypotensive. We have started him on Cozaar preoperatively. I think we can stop it now to avoid excessive hypotension.  CAD S/P percutaneous coronary angioplasty -  DES PCI to occluded LAD during anterior STEMI Remains asymptomatic. On low-dose beta blocker. He is very active and has not had anginal symptoms. Preop cath showed minimal CAD with widely patent LAD stent. He is not on statin, but has been relatively well controlled based on his PCPs evaluation.  Dyslipidemia, goal LDL below 70 Followup by PCP. By report his control is inadequate. Not on statins due to myalgias.    Orders Placed This Encounter  Procedures  . EKG 12-Lead  . 2D Echocardiogram without contrast    Standing Status: Future     Number of Occurrences:      Standing Expiration Date: 05/26/2015    Order Specific Question:  Type of Echo    Answer:  Complete    Order Specific Question:   Where should this test be performed    Answer:  [facility]    Order Specific Question:  Reason for exam-Echo    Answer:  Chest Pain  786.50 / R07.9   Meds ordered this encounter  Medications  . zolpidem (AMBIEN) 5 MG tablet    Sig: Take 1 tablet (5 mg total) by mouth at bedtime as needed for sleep.    Dispense:  30 tablet    Refill:  1     Followup: 6 months   Juliza Machnik W, M.D., M.S. Interventional Cardiologist   Pager # [phone]

## 2014-06-15 ENCOUNTER — Other Ambulatory Visit: Payer: Self-pay | Admitting: Cardiology

## 2014-06-15 NOTE — Telephone Encounter (Signed)
Rx was sent to pharmacy electronically. 

## 2014-07-23 ENCOUNTER — Encounter (HOSPITAL_COMMUNITY): Payer: Self-pay | Admitting: Cardiovascular Disease

## 2014-07-28 ENCOUNTER — Other Ambulatory Visit: Payer: Self-pay | Admitting: Cardiology

## 2014-07-29 NOTE — Telephone Encounter (Signed)
OK to fill.  [place]

## 2014-07-30 NOTE — Telephone Encounter (Signed)
Phoned in refill via pharmacy VM service

## 2014-07-31 ENCOUNTER — Telehealth: Payer: Self-pay | Admitting: Cardiology

## 2014-07-31 NOTE — Telephone Encounter (Signed)
Left message on answer machine. Medication was called into pharmacy on 12/171/5.

## 2014-07-31 NOTE — Telephone Encounter (Signed)
I think I already responded to that - OK to refill.  Alan [name] W

## 2014-07-31 NOTE — Telephone Encounter (Signed)
Pt called in stating that he needs a refill on his Zolpidem called in to the [facility] in [place]. Please call  Thanks

## 2014-08-03 ENCOUNTER — Telehealth: Payer: Self-pay | Admitting: *Deleted

## 2014-08-03 NOTE — Telephone Encounter (Signed)
Spoke to [place] ( rep) information given for prior authorization DX insomnia  Can use up to 1 tablet nightly. Approve from 06/04/14 to 08/03/15,notified [facility] and patient 's wife notifed

## 2014-09-14 ENCOUNTER — Other Ambulatory Visit: Payer: Self-pay | Admitting: Cardiology

## 2014-09-17 NOTE — Telephone Encounter (Signed)
Okay to refill? 

## 2014-09-22 NOTE — Telephone Encounter (Signed)
Prescription called in to pharmacy

## 2014-11-03 ENCOUNTER — Other Ambulatory Visit: Payer: Self-pay | Admitting: *Deleted

## 2014-11-03 DIAGNOSIS — R911 Solitary pulmonary nodule: Secondary | ICD-10-CM

## 2014-11-30 ENCOUNTER — Ambulatory Visit (INDEPENDENT_AMBULATORY_CARE_PROVIDER_SITE_OTHER): Payer: Federal, State, Local not specified - PPO | Admitting: Thoracic Surgery (Cardiothoracic Vascular Surgery)

## 2014-11-30 ENCOUNTER — Ambulatory Visit
Admission: RE | Admit: 2014-11-30 | Discharge: 2014-11-30 | Disposition: A | Discharge status: Home / Self Care | Payer: Federal, State, Local not specified - PPO | Source: Ambulatory Visit | Attending: Thoracic Surgery (Cardiothoracic Vascular Surgery) | Admitting: Thoracic Surgery (Cardiothoracic Vascular Surgery)

## 2014-11-30 ENCOUNTER — Encounter: Payer: Self-pay | Admitting: Thoracic Surgery (Cardiothoracic Vascular Surgery)

## 2014-11-30 VITALS — BP 112/73 | HR 74 | Resp 20 | Ht 76.0 in | Wt 196.0 lb

## 2014-11-30 DIAGNOSIS — Z9889 Other specified postprocedural states: Secondary | ICD-10-CM

## 2014-11-30 DIAGNOSIS — R911 Solitary pulmonary nodule: Secondary | ICD-10-CM | POA: Diagnosis not present

## 2014-11-30 NOTE — Patient Instructions (Signed)

## 2014-11-30 NOTE — Progress Notes (Signed)
[address]       [place] [address]             [phone]     CARDIOTHORACIC SURGERY OFFICE NOTE  Referring Provider is Leonie Man, MD PCP is Donnie Coffin, MD   HPI:  Patient returns for routine followup status post minimally invasive mitral valve repair on 04/08/2014. His postoperative recovery in the hospital was entirely uncomplicated and he was discharged on the fourth postoperative day. He remains on dual antiplatelet therapy because of his history of coronary artery disease with drug-eluting stent. For this reason he was not placed on anticoagulation using warfarin. He was last seen in our office on 05/25/2014 at which time he was doing well.  He was seen in follow-up by Dr. Ellyn Hack the following day who plans to see the patient again in the near future and get a routine follow-up echocardiogram. The patient returns to the office today having undergone follow-up chest CT scan because of the presence of a small nodule in the right lung that was incidentally discovered prior to his mitral valve surgery. The patient states that clinically he is doing exceptionally well from a cardiac standpoint. He is back to normal physical activity with good exercise tolerance. He denies any symptoms of exertional shortness of breath or fatigue.  Overall the patient states that he feels fantastic with exception of problems related to left shoulder pain that is unrelated from his cardiac surgery.    Current Outpatient Prescriptions  Medication Sig Dispense Refill  . aspirin 81 MG tablet Take 81 mg by mouth daily.     . cholecalciferol (VITAMIN D) 1000 UNITS tablet Take 1,000 Units by mouth daily.    . clopidogrel (PLAVIX) 75 MG tablet Take 75 mg by mouth daily.    . Coenzyme Q10 (CO Q 10) 100 MG CAPS Take 100 mg by mouth daily.     . metoprolol tartrate (LOPRESSOR) 25 MG tablet TAKE 1/2 TABLET(12.5 MG) BY MOUTH TWICE DAILY 30 tablet 11  . Multiple Vitamins-Minerals  (MULTIVITAMINS THER. W/MINERALS) TABS Take 1 tablet by mouth daily.    . Probiotic Product (PROBIOTIC DAILY PO) Take 1 tablet by mouth daily.    Marland Kitchen zolpidem (AMBIEN) 5 MG tablet TAKE 1 TABLET BY MOUTH EVERY DAY AS NEEDED FOR AT BEDTIME 30 tablet 0   No current facility-administered medications for this visit.      Physical Exam:   BP 112/73 mmHg  Pulse 74  Resp 20  Ht 6\' 4"  (1.93 m)  Wt 196 lb (88.905 kg)  BMI 23.87 kg/m2  SpO2 98%  General:  Well-appearing  Chest:   Clear  CV:   Regular rate and rhythm without murmur  Incisions:  Completely healed  Abdomen:  Soft and nontender  Extremities:  Warm and well-perfused  Diagnostic Tests:  CT CHEST WITHOUT CONTRAST  TECHNIQUE: Multidetector CT imaging of the chest was performed following the standard protocol without IV contrast.  COMPARISON: 03/13/2014  FINDINGS: Heart is normal size. Ascending aorta is upper limits normal in caliber measuring 3.7 cm maximally, stable. Coronary artery calcifications noted. Left anterior descending coronary stent noted. Prior mitral valve replacement.  No mediastinal, hilar or axillary adenopathy. Chest wall soft tissues are unremarkable.  Ground-glass nodule is again noted within the right middle lobe. This measures 10 x 7 mm compared with 12 x 10 mm previously, it measured on image 44 of today's study. Linear densities in the lung bases, most  likely scarring. No additional suspicious pulmonary nodules. No pleural effusions.  Imaging into the upper abdomen demonstrates no acute findings. No acute bony abnormality or focal bone lesion.  IMPRESSION: Right middle lobe nodule again noted, slightly smaller on today's study, measuring 10 x 7 mm. This most likely reflects an inflammatory process given the slightly smaller size, but recommend continued followup with repeat CT in 6-12 months.   Electronically Signed  By: Rolm Baptise M.D.  On: 11/30/2014  14:35    Impression:  Patient is doing very well more than 6 months status post minimally invasive mitral valve repair. He recovered uneventfully and currently enjoys normal exercise tolerance without any symptoms of exertional shortness of breath. He does not have a murmur on exam. He has not yet had a routine follow-up echocardiogram performed since surgery.  Follow-up chest CT scan demonstrates that the small nodule in the right lung has actually gotten a bit smaller in size, most consistent with some type of inflammatory process. The interpreting radiologist has recommended at least 1 additional follow-up CT scan in the future to ensure stability.    Plan:  The patient will return in 6 months for one additional CT scan of the chest. He will continue to follow-up with Dr. Ellyn Hack. I reminded the patient that at some point he should have a routine follow-up echocardiogram performed to reestablish new baseline and reassess left ventricular systolic function. He has been reminded regarding the lifelong need for antibiotic prophylaxis for all dental cleaning and related procedures.   I spent in excess of 15 minutes during the conduct of this office consultation and >50% of this time involved direct face-to-face encounter with the patient for counseling and/or coordination of their care.   Valentina Gu. Roxy Manns, MD 11/30/2014 2:58 PM

## 2014-12-31 ENCOUNTER — Other Ambulatory Visit: Payer: Self-pay | Admitting: Cardiology

## 2015-01-01 NOTE — Telephone Encounter (Signed)
Rx(s) sent to pharmacy electronically.  

## 2015-05-20 ENCOUNTER — Other Ambulatory Visit: Payer: Self-pay | Admitting: *Deleted

## 2015-05-20 DIAGNOSIS — R911 Solitary pulmonary nodule: Secondary | ICD-10-CM

## 2015-05-24 ENCOUNTER — Other Ambulatory Visit: Payer: Self-pay | Admitting: Cardiology

## 2015-05-24 NOTE — Telephone Encounter (Signed)
Alan [name], this patient wants a refill on ambien. I do not feel comfortable refilling this medication. Will you please take care of this. Thank you for your time.

## 2015-05-24 NOTE — Telephone Encounter (Signed)
PATIENT IS REQUESTING REFILL MEDICATIONS - AMBIEN LAST PRESCRIPTION WAS 10 /2015  WITH ONLY 1 REFILL. IS IT OKAY, TO REFILL OR SEND TO PRIMARY TO FILL?

## 2015-05-25 NOTE — Telephone Encounter (Signed)
Called in prescription refill x 1 ambien 5 mg #30

## 2015-05-25 NOTE — Telephone Encounter (Signed)
OK to refill until he has PCP

## 2015-06-07 ENCOUNTER — Ambulatory Visit
Admission: RE | Admit: 2015-06-07 | Discharge: 2015-06-07 | Disposition: A | Discharge status: Home / Self Care | Payer: Federal, State, Local not specified - PPO | Source: Ambulatory Visit | Attending: Thoracic Surgery (Cardiothoracic Vascular Surgery) | Admitting: Thoracic Surgery (Cardiothoracic Vascular Surgery)

## 2015-06-07 ENCOUNTER — Ambulatory Visit (INDEPENDENT_AMBULATORY_CARE_PROVIDER_SITE_OTHER): Payer: Federal, State, Local not specified - PPO | Admitting: Thoracic Surgery (Cardiothoracic Vascular Surgery)

## 2015-06-07 ENCOUNTER — Encounter: Payer: Self-pay | Admitting: Thoracic Surgery (Cardiothoracic Vascular Surgery)

## 2015-06-07 VITALS — BP 115/78 | HR 75 | Resp 20 | Ht 76.0 in | Wt 196.0 lb

## 2015-06-07 DIAGNOSIS — I340 Nonrheumatic mitral (valve) insufficiency: Secondary | ICD-10-CM | POA: Diagnosis not present

## 2015-06-07 DIAGNOSIS — R911 Solitary pulmonary nodule: Secondary | ICD-10-CM | POA: Diagnosis not present

## 2015-06-07 DIAGNOSIS — I341 Nonrheumatic mitral (valve) prolapse: Secondary | ICD-10-CM

## 2015-06-07 DIAGNOSIS — Z9889 Other specified postprocedural states: Secondary | ICD-10-CM

## 2015-06-07 NOTE — Progress Notes (Signed)
[address]       [place], [address]             [phone]     CARDIOTHORACIC SURGERY OFFICE NOTE  Referring Provider is Leonie Man, MD PCP is Donnie Coffin, MD   HPI:  Patient returns for routine follow-up approximately one year status post minimally invasive mitral valve repair on 04/08/2014. He was last seen here in our office on 11/30/2014. Since then the patient has continued to do very well clinically. He has been followed intermittently by Dr. Ellyn Hack. He has still not had a follow-up echocardiogram since his surgery was performed. The patient reports normal exercise tolerance with no limitations whatsoever. He specifically denies any symptoms of exertional shortness of breath. He never gets any chest pain or chest pressure with activity. His health is otherwise been good and he has no complaints.   Current Outpatient Prescriptions  Medication Sig Dispense Refill  . aspirin 81 MG tablet Take 81 mg by mouth daily.     . cholecalciferol (VITAMIN D) 1000 UNITS tablet Take 1,000 Units by mouth daily.    . clopidogrel (PLAVIX) 75 MG tablet Take 1 tablet (75 mg total) by mouth daily. 30 tablet 5  . Coenzyme Q10 (CO Q 10) 100 MG CAPS Take 100 mg by mouth daily.     . metoprolol tartrate (LOPRESSOR) 25 MG tablet TAKE 1/2 TABLET(12.5 MG) BY MOUTH TWICE DAILY 30 tablet 11  . Multiple Vitamins-Minerals (MULTIVITAMINS THER. W/MINERALS) TABS Take 1 tablet by mouth daily.    . Probiotic Product (PROBIOTIC DAILY PO) Take 1 tablet by mouth daily.    Marland Kitchen zolpidem (AMBIEN) 5 MG tablet TAKE 1 TABLET BY MOUTH EVERY DAY AT BEDTIME AS NEEDED FOR SLEEP 30 tablet 1   No current facility-administered medications for this visit.      Physical Exam:   BP 115/78 mmHg  Pulse 75  Resp 20  Ht '6\' 4"'$  (1.93 m)  Wt 196 lb (88.905 kg)  BMI 23.87 kg/m2  SpO2 98%  General:  Well-appearing  Chest:   Clear  CV:   Regular rate and rhythm without  murmur  Incisions:  Completely healed  Abdomen:  Soft and nontender  Extremities:  Warm and well-perfused  Diagnostic Tests:  CT CHEST WITHOUT CONTRAST  TECHNIQUE: Multidetector CT imaging of the chest was performed following the standard protocol without IV contrast.  COMPARISON: Chest CT, 11/30/2014 and 03/13/2014.  FINDINGS: Lungs and pleura: There is a small irregular focal opacity in the right middle lobe just below the minor fissure. It currently measures 13 x 8 by 6 mm. This is without significant change the prior 2 studies allowing for differences in measurement technique. There is also linear opacity in the lateral right middle lobe which retracts the oblique fissure anteriorly. This is consistent with scarring or atelectasis. It is stable from the most recent prior exam. Minor linear scarring or subsegmental atelectasis is noted in the lateral right lower lobe, stable. Lungs are otherwise clear. No pleural effusion or pneumothorax.  Thoracic inlet: No mass or adenopathy. Thyroid is unremarkable.  Mediastinum and hila: Heart is normal in size and configuration. Prosthetic mitral valve appears well positioned and is also stable. There are dense coronary artery calcifications. No mediastinal or hilar masses or enlarged lymph nodes.  Limited upper abdomen: Unremarkable.  Musculoskeletal: Minimal mid thoracic spine disc degenerative change. Otherwise unremarkable.  IMPRESSION: 1. Small irregular focal opacity in the right middle lobe  is without substantial change from the prior 2 exams allowing for differences in slice selection and measurement technique. This is likely an area of postinflammatory scarring. There is linear scarring or atelectasis in the lateral right middle lobe, stable from the most recent prior exam. Recommend 1 additional followup unenhanced chest CT in 1 year to re-evaluate this focal right middle lobe opacity and establish 2 years  of stability. 2. No acute findings. No new lung nodules.   Electronically Signed  By: Lajean Manes M.D.  On: 06/07/2015 13:58   Impression:  Patient is doing very well more than one year status post minimally invasive mitral valve repair. He denies any symptoms of exertional shortness of breath or chest discomfort. Follow-up CT scan of the chest performed to reevaluate the presence of a small, benign-appearing nodule in the right lung remains stable. The patient has never been a smoker and would be at low risk for lung cancer.  Plan:  I have suggested that the patient should undergo routine follow-up echocardiogram at some point to establish a new baseline.  I have reminded the patient that he should continue to take prophylactic antibiotics prior to all dental cleanings and related procedures for the rest of his life. All of his questions have been addressed. In the future he will call and return to see [place] as needed.  I spent in excess of 15 minutes during the conduct of this office consultation and >50% of this time involved direct face-to-face encounter with the patient for counseling and/or coordination of their care.  Valentina Gu. Roxy Manns, MD 06/07/2015 3:26 PM

## 2015-06-07 NOTE — Patient Instructions (Signed)

## 2015-06-18 ENCOUNTER — Telehealth: Payer: Self-pay | Admitting: *Deleted

## 2015-06-18 DIAGNOSIS — Z9889 Other specified postprocedural states: Secondary | ICD-10-CM

## 2015-06-18 DIAGNOSIS — I341 Nonrheumatic mitral (valve) prolapse: Secondary | ICD-10-CM

## 2015-06-18 NOTE — Telephone Encounter (Signed)
-----   Message from Cruz Condon sent at 06/08/2015 11:23 AM EDT ----- Alan [name].. This pt saw Dr. Ricard Dillon and he suggested that he have a baseline echocardiogram on file. Pt wants Dr. Ellyn Hack to order this for him.  Please advise Thanks Charlena Cross

## 2015-06-18 NOTE — Telephone Encounter (Signed)
Patient needs an echo Per last office note - needed echo and follow up  RN spoke to ebony -She states she will give patient appointment.

## 2015-06-28 ENCOUNTER — Other Ambulatory Visit: Payer: Self-pay | Admitting: Cardiology

## 2015-06-29 ENCOUNTER — Other Ambulatory Visit: Payer: Self-pay

## 2015-06-29 ENCOUNTER — Ambulatory Visit (HOSPITAL_COMMUNITY): Payer: Federal, State, Local not specified - PPO | Attending: Cardiology

## 2015-06-29 DIAGNOSIS — I10 Essential (primary) hypertension: Secondary | ICD-10-CM | POA: Insufficient documentation

## 2015-06-29 DIAGNOSIS — Z9889 Other specified postprocedural states: Secondary | ICD-10-CM

## 2015-06-29 DIAGNOSIS — I341 Nonrheumatic mitral (valve) prolapse: Secondary | ICD-10-CM | POA: Diagnosis not present

## 2015-06-29 DIAGNOSIS — E785 Hyperlipidemia, unspecified: Secondary | ICD-10-CM | POA: Diagnosis not present

## 2015-06-29 DIAGNOSIS — I77810 Thoracic aortic ectasia: Secondary | ICD-10-CM | POA: Diagnosis not present

## 2015-06-29 DIAGNOSIS — I5189 Other ill-defined heart diseases: Secondary | ICD-10-CM | POA: Diagnosis not present

## 2015-06-30 ENCOUNTER — Other Ambulatory Visit: Payer: Self-pay | Admitting: Cardiology

## 2015-06-30 HISTORY — PX: TRANSTHORACIC ECHOCARDIOGRAM: SHX275

## 2015-06-30 NOTE — Telephone Encounter (Signed)
Rx(s) sent to pharmacy electronically.  

## 2015-06-30 NOTE — Telephone Encounter (Signed)
°*  STAT* If patient is at the pharmacy, call can be transferred to refill team.   1. Which medications need to be refilled? (please list name of each medication and dose if known) Clopidogrel   2. Which pharmacy/location (including street and city if local pharmacy) is medication to be sent to? [facility] on [address]   3. Do they need a 30 day or 90 day supply? 90  * he is completely out*

## 2015-07-01 ENCOUNTER — Telehealth: Payer: Self-pay | Admitting: *Deleted

## 2015-07-01 MED ORDER — CLOPIDOGREL BISULFATE 75 MG PO TABS: 75.0000 mg | ORAL_TABLET | Freq: Every day | ORAL | Status: DC

## 2015-07-01 NOTE — Telephone Encounter (Signed)
REFILL 

## 2015-07-01 NOTE — Telephone Encounter (Signed)
-----   Message from Leonie Man, MD sent at 07/01/2015  3:44 PM EST ----- Good news.   The Mitral Valve looks great - no MR & normal gradients. EF normal @ 50-55%.  Everything else looks great!!.  [place], Leonie Green, MD

## 2015-07-01 NOTE — Telephone Encounter (Signed)
Spoke to patient. Result given . Verbalized understanding Appointment schedule 1/10/16at 4 pm Refill clopidogrel

## 2015-07-08 ENCOUNTER — Other Ambulatory Visit: Payer: Self-pay | Admitting: Cardiology

## 2015-07-12 NOTE — Telephone Encounter (Signed)
°*  STAT* If patient is at the pharmacy, call can be transferred to refill team.   1. Which medications need to be refilled? (please list name of each medication and dose if known) Metoprolol  2. Which pharmacy/location (including street and city if local pharmacy) is medication to be sent to?[facility][phone]  3. Do they need a 30 day or 90 day supply? 30 and refills- does need this until his next appt,January 10,2017 please.

## 2015-08-09 ENCOUNTER — Other Ambulatory Visit: Payer: Self-pay | Admitting: Cardiology

## 2015-08-24 ENCOUNTER — Ambulatory Visit (INDEPENDENT_AMBULATORY_CARE_PROVIDER_SITE_OTHER): Payer: Federal, State, Local not specified - PPO | Admitting: Cardiology

## 2015-08-24 ENCOUNTER — Encounter: Payer: Self-pay | Admitting: Cardiology

## 2015-08-24 VITALS — BP 124/82 | HR 67 | Ht 76.0 in | Wt 194.0 lb

## 2015-08-24 DIAGNOSIS — Z9889 Other specified postprocedural states: Secondary | ICD-10-CM

## 2015-08-24 DIAGNOSIS — I10 Essential (primary) hypertension: Secondary | ICD-10-CM

## 2015-08-24 DIAGNOSIS — I341 Nonrheumatic mitral (valve) prolapse: Secondary | ICD-10-CM | POA: Diagnosis not present

## 2015-08-24 DIAGNOSIS — I2510 Atherosclerotic heart disease of native coronary artery without angina pectoris: Secondary | ICD-10-CM

## 2015-08-24 DIAGNOSIS — E785 Hyperlipidemia, unspecified: Secondary | ICD-10-CM

## 2015-08-24 DIAGNOSIS — Z9861 Coronary angioplasty status: Secondary | ICD-10-CM

## 2015-08-24 NOTE — Progress Notes (Signed)
PCP: Donnie Coffin, MD  Clinic Note: Chief Complaint  Patient presents with  . Annual Exam    NO COMPLAINTS.  Marland Kitchen Coronary Artery Disease  . Mitral Valve Prolapse    Status post MVR    HPI: Alan [name] is a 60 y.o. male with a PMH below who presents today for delayed one-year follow-up for his history of CAD-PCI to LAD in the setting anterior STEMI as well as minimally invasive mitral repair in August 2015 by Dr. Roxy Manns.  Alan [name] was last seen on 05/26/2014 here at this office. He has seen Dr. Roxy Manns several times to follow-up abnormal findings on chest CT. He was last seen there on 06/07/2015 - the follow-up CT scan found a small benign-appearing nodule in the right lung that was stable. He was doing otherwise well from a postop mitral valve repair.  Recent Hospitalizations: None  Studies Reviewed:   2-D echocardiogram on 06/30/2015: Low normal LV function with EF 50-55%. GR 1 DD. No MR. Normal gradient of 4 mmHg the mitral valve. No prolapse.  Interval History: [name] presents today doing great without any major complaints. He is back to his routine level activity. He is thinking about retiring soon. 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 This is made him really look his life, and he hopes to try to enjoy some time being retired. He is very stable cardiac standpoint. No active anginal heart failure symptoms.   No chest pain or shortness of breath with rest or exertion. No PND, orthopnea or edema. No palpitations, lightheadedness, dizziness, weakness or syncope/near syncope. No TIA/amaurosis fugax symptoms. No claudication.  ROS: A comprehensive was performed. Review of Systems  Constitutional: Negative for malaise/fatigue.  HENT: Negative for nosebleeds.   Respiratory: Negative.   Cardiovascular:       Per history of present illness  Gastrointestinal: Negative for blood in stool and melena.  Genitourinary: Negative for  hematuria.  Musculoskeletal: Negative for myalgias.  Neurological: Negative.  Negative for weakness.  Endo/Heme/Allergies: Does not bruise/bleed easily.  Psychiatric/Behavioral: Negative for depression.  All other systems reviewed and are negative.    Past Medical History  Diagnosis Date  . ST elevation myocardial infarction (STEMI) of anterior wall, subsequent episode of care [facility]) 2004    he had a proximal LAD occlusion treated with 2 overlapping 3.5 x 1.8 mm Cypher DES stents.  Marland Kitchen CAD S/P percutaneous coronary angioplasty 2004    which showed essentially normal LV size and function, moderately dilated left atrium, moderate mitral prolapse with mild to moderate regurgitation.  Marland Kitchen History of stress test 12/2009    he walked 9 mins reaching 10 METS. There was an attenuation artifact in the inferior region but no ischemia or infaract, low risk.  Marland Kitchen History of Mitral valve prolapse     Moderate - with moderate MR, noted February t 2013  . History of Severe mitral regurgitation by prior echocardiogram 02/06/2014    TEE: Severe mitral regurgitation with a flail P2 segment and ruptured; Normal LV size & function, dilated LA.  . 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 Dyslipidemia, goal LDL below 70   . Essential hypertension   . Incidental lung nodule, greater than or equal  to 92m 03/16/2014    Ground glass opacity RML noted on CT scan  . PONV (postoperative nausea and vomiting)   . GERD (gastroesophageal reflux disease)     Past Surgical History  Procedure Laterality Date  . Cardiac catheterization  2004    he had a proximal LAD occlusion treated with 2 overlapping 3.5 x 1.8 mm Cypher DES stents.  . Cardiac  catheterization  2007; 03/2014    Widely patent LAD stents, 40% distal LAD, 30% Cx, ~50% PL-PDA bifurcation lesion   . Anterior cruciate ligament repair  1993  . Nm myoview ltd  May 2011    Walk 9 minutes, and 10 METs, diaphragmatic attenuation but no ischemia or infarction.  . Transthoracic echocardiogram  February 2013    Mild L. the dilation (likely normal for size) normal function greater than 55% EF. Moderate LA dilation. Moderate mitral prolapse of posterior leaflet with mild to moderate mitral regurgitation. -- No significant change since 2010  . Tee without cardioversion N/A 02/06/2014    Procedure: TRANSESOPHAGEAL ECHOCARDIOGRAM (TEE);  Surgeon: KPixie Casino MD;  Normal LV Size U& function - EF 55-60%, no regional WMA.  MV P2 Leaflet is flail with ruptured chord with severe prolapse, anterior leaflet intact.  Severe, eccentric anterior directed MR with dilated LA.  . Colonoscopy    . Mitral valve repair Right 04/08/2014    Procedure: MINIMALLY INVASIVE MITRAL VALVE REPAIR (MVR);  Surgeon: CRexene Alberts MD;  [place]  Service: Open Heart Surgery;  Laterality: Right;  . Intraoperative transesophageal echocardiogram N/A 04/08/2014    Procedure: INTRAOPERATIVE TRANSESOPHAGEAL ECHOCARDIOGRAM;  Surgeon: CRexene Alberts MD;  [place]  Service: Open Heart Surgery;  Laterality: N/A;  . Left heart catheterization with coronary angiogram N/A 09/15/2011    Procedure: LEFT HEART CATHETERIZATION WITH CORONARY ANGIOGRAM;  Surgeon: JLorretta Harp MD;  [facility];  Service: Cardiovascular;  Laterality: N/A;  . Left and right heart catheterization with coronary angiogram N/A 03/05/2014    Procedure: LEFT AND RIGHT HEART CATHETERIZATION WITH CORONARY ANGIOGRAM;  Surgeon: DLeonie Man MD;  [facility];  Service: Cardiovascular;  Laterality: N/A;  . Transthoracic echocardiogram  June 30 2015    Low normal LV function with EF 50-55%. GR 1 DD. No MR. Normal  gradient of 4 mmHg the mitral valve. No prolapse.   Prior to Admission medications   Medication Sig Start Date End Date Taking? Authorizing Provider  aspirin 81 MG tablet Take 81 mg by mouth daily.    Yes Historical Provider, MD  cholecalciferol (VITAMIN D) 1000 UNITS tablet Take 1,000 Units by mouth daily.   Yes Historical Provider, MD  clopidogrel (PLAVIX) 75 MG tablet TAKE 1 TABLET(75 MG) BY MOUTH DAILY 07/01/15  Yes DLeonie Man MD  Coenzyme Q10 (CO Q 10) 100 MG CAPS Take 100 mg by mouth daily.    Yes Historical Provider, MD  metoprolol tartrate (LOPRESSOR) 25 MG tablet TAKE 1/2 TABLET BY MOUTH TWICE DAILY 08/10/15  Yes KRockne Menghini RPH-CPP  Multiple Vitamins-Minerals (MULTIVITAMINS THER. W/MINERALS) TABS Take 1 tablet by mouth daily.   Yes Historical Provider, MD  Probiotic Product (PROBIOTIC DAILY PO) Take 1 tablet by mouth daily.   Yes Historical Provider, MD  zolpidem (AMBIEN) 5 MG tablet TAKE 1 TABLET BY MOUTH EVERY DAY AT BEDTIME AS NEEDED FOR SLEEP 05/25/15  Yes DLeonie Man MD   Allergies  Allergen Reactions  . Amoxicillin Hives  . Other  Hives    "CILLINS"  . Penicillins Hives     Social History   Social History  . Marital Status: Single    Spouse Name: N/A  . Number of Children: N/A  . Years of Education: N/A   Social History Main Topics  . Smoking status: Never Smoker   . Smokeless tobacco: Never Used  . Alcohol Use: Yes     Comment: social  . Drug Use: No  . Sexual Activity: Not Asked   Other Topics Concern  . None   Social History Narrative   He is a married father of one. Exercises avidly as noted above - runs routinely at least 3 miles 3-4 times a day.  He drinks his Xango fruit juce - 32 Oz. Daily.     Never smoked and only takes occasional alcohol   Family History  Problem Relation Age of Onset  . Hypertension Mother   . Heart Problems Father     triple bypass 1989  . Cancer Paternal Grandmother     Pancreatic     Wt Readings  from Last 3 Encounters:  08/24/15 194 lb (87.998 kg)  06/07/15 196 lb (88.905 kg)  11/30/14 196 lb (88.905 kg)    PHYSICAL EXAM BP 124/82 mmHg  Pulse 67  Ht '6\' 4"'$  (1.93 m)  Wt 194 lb (87.998 kg)  BMI 23.62 kg/m2 General appearance: alert, cooperative, appears stated age, no distress; healthy appearing well-nourished and well-groomed. Neck: no adenopathy, no carotid bruit and no JVD HEENT: [place]/AT, EOMI, MMM, anicteric sclera Lungs: CTAB, normal percussion bilaterally and non-labored Heart: RRR, S1& S2 normal, no murmur, click, rub or gallop; nondisplaced PMI Abdomen: soft, non-tender; bowel sounds normal; no masses, no organomegaly;  Extremities: extremities normal, atraumatic, no cyanosis, and edema  Pulses: 2+ and symmetric;  Neurologic: Mental status: Alert, oriented, thought content appropriate Cranial nerves: normal (II-XII grossly intact)   Adult ECG Report  Rate: 68 ;  Rhythm: normal sinus rhythm and Possible left atrial enlargement. Normal axis, intervals and durations.;   Narrative Interpretation: Relatively normal EKG. Stable   Other studies Reviewed: Additional studies/ records that were reviewed today include:  Recent Labs:  He states that he recently had labs checked by his PCP. He says cholesterol level was a little bit "up for him", but still in the normal range.    ASSESSMENT / PLAN: Problem List Items Addressed This Visit    S/P MVR (mitral valve repair)   Relevant Orders   EKG 12-Lead (Completed)   S/P minimally invasive mitral valve repair (Chronic)    Repeat echo looked good. Probably doesn't need another one for 2 years.      History of Severe Mitral valve prolapse - severe prolapse of the posterior (P2) leaflet with severe MR (Chronic)    Recent relook echo looks great. Low normal EF, but normally functioning valve. No heart failure symptoms. No murmur on exam Would probably follow-up echo may be every 2 years      Essential hypertension  (Chronic)    Excellent control on beta blocker. We started Cozaar preoperatively, but was stopped due to hypotension      Dyslipidemia, goal LDL below 70 (Chronic)    Monitored by PCP. He takes coenzyme Q 10 and is very active. He has had myalgias with statins, and as long as his lipids look okay, I think we can be fine holding off.      Relevant Orders   EKG 12-Lead (Completed)   CAD S/P  percutaneous coronary angioplasty - DES PCI to occluded LAD during anterior STEMI - Primary (Chronic)    Asymptomatic with no anginal symptoms. He is on low-dose beta blocker as well as Plavix. No bleeding issues. Stent was widely patent and preop catheterization surgery.  Lipid panel has been relatively normal per PCP evaluation. Not currently on statin, due to patient preference      Relevant Orders   EKG 12-Lead (Completed)      Current medicines are reviewed at length with the patient today. (+/- concerns) none The following changes have been made: None Studies Ordered:   Orders Placed This Encounter  Procedures  . EKG 12-Lead      Leonie Man, M.D., M.S. Interventional Cardiologist   Pager # [phone]

## 2015-08-24 NOTE — Patient Instructions (Signed)
NO CHANGE IN CURRENT MEDICATIONS  Your physician wants you to follow-up in 12 MONTHS WITH DR HARDING.  You will receive a reminder letter in the mail two months in advance. If you don't receive a letter, please call our office to schedule the follow-up appointment.  If you need a refill on your cardiac medications before your next appointment, please call your pharmacy.   

## 2015-08-26 ENCOUNTER — Encounter: Payer: Self-pay | Admitting: Cardiology

## 2015-08-26 NOTE — Assessment & Plan Note (Signed)
Asymptomatic with no anginal symptoms. He is on low-dose beta blocker as well as Plavix. No bleeding issues. Stent was widely patent and preop catheterization surgery.  Lipid panel has been relatively normal per PCP evaluation. Not currently on statin, due to patient preference

## 2015-08-26 NOTE — Assessment & Plan Note (Signed)
Repeat echo looked good. Probably doesn't need another one for 2 years.

## 2015-08-26 NOTE — Assessment & Plan Note (Signed)
Recent relook echo looks great. Low normal EF, but normally functioning valve. No heart failure symptoms. No murmur on exam Would probably follow-up echo may be every 2 years

## 2015-08-26 NOTE — Assessment & Plan Note (Addendum)
Monitored by PCP. He takes coenzyme Q 10 and is very active. He has had myalgias with statins, and as long as his lipids look okay, I think we can be fine holding off.

## 2015-08-26 NOTE — Assessment & Plan Note (Signed)
Excellent control on beta blocker. We started Cozaar preoperatively, but was stopped due to hypotension

## 2015-10-31 ENCOUNTER — Other Ambulatory Visit: Payer: Self-pay | Admitting: Cardiology

## 2015-11-01 NOTE — Telephone Encounter (Signed)
Rx request sent to pharmacy.  

## 2016-02-22 ENCOUNTER — Other Ambulatory Visit: Payer: Self-pay | Admitting: Pharmacist Clinician (PhC)/ Clinical Pharmacy Specialist

## 2016-04-20 ENCOUNTER — Other Ambulatory Visit: Payer: Self-pay | Admitting: Cardiology

## 2016-05-24 ENCOUNTER — Other Ambulatory Visit: Payer: Self-pay | Admitting: Cardiology

## 2016-05-26 ENCOUNTER — Other Ambulatory Visit: Payer: Self-pay | Admitting: *Deleted

## 2016-05-26 ENCOUNTER — Other Ambulatory Visit: Payer: Self-pay

## 2016-05-26 MED ORDER — ZOLPIDEM TARTRATE 5 MG PO TABS: ORAL_TABLET | ORAL | 2 refills | Status: DC

## 2016-05-26 NOTE — Telephone Encounter (Signed)
REFILLED BY PHONE  #[phone] X 2 REFILLS

## 2016-10-30 ENCOUNTER — Other Ambulatory Visit: Payer: Self-pay | Admitting: Cardiology

## 2017-01-30 ENCOUNTER — Other Ambulatory Visit: Payer: Self-pay | Admitting: Cardiology

## 2017-03-01 ENCOUNTER — Other Ambulatory Visit: Payer: Self-pay | Admitting: Cardiology

## 2017-03-01 NOTE — Telephone Encounter (Signed)
REFILL 

## 2017-06-03 ENCOUNTER — Other Ambulatory Visit: Payer: Self-pay | Admitting: Cardiology

## 2017-07-04 ENCOUNTER — Ambulatory Visit: Payer: Federal, State, Local not specified - PPO | Admitting: Cardiology

## 2017-07-04 ENCOUNTER — Encounter: Payer: Self-pay | Admitting: Cardiology

## 2017-07-04 VITALS — BP 117/60 | HR 59 | Ht 76.0 in | Wt 185.0 lb

## 2017-07-04 DIAGNOSIS — I341 Nonrheumatic mitral (valve) prolapse: Secondary | ICD-10-CM

## 2017-07-04 DIAGNOSIS — I10 Essential (primary) hypertension: Secondary | ICD-10-CM

## 2017-07-04 DIAGNOSIS — E785 Hyperlipidemia, unspecified: Secondary | ICD-10-CM

## 2017-07-04 DIAGNOSIS — Z9861 Coronary angioplasty status: Secondary | ICD-10-CM

## 2017-07-04 DIAGNOSIS — Z9889 Other specified postprocedural states: Secondary | ICD-10-CM

## 2017-07-04 DIAGNOSIS — I2510 Atherosclerotic heart disease of native coronary artery without angina pectoris: Secondary | ICD-10-CM | POA: Diagnosis not present

## 2017-07-04 DIAGNOSIS — I340 Nonrheumatic mitral (valve) insufficiency: Secondary | ICD-10-CM

## 2017-07-04 NOTE — Progress Notes (Signed)
PCP: Alan [name], L.[name], MD  Clinic Note: Chief Complaint  Patient presents with  . Follow-up    CAD, valve disease    HPI: Alan [name] is a 61 y.o. male with a PMH below who presents today for almost 2-year follow-up for CAD-PCI. Marland KitchenCAD-PCI to LAD in the setting anterior STEMI as well as minimally invasive mitral repair in August 2015 by Dr. Roxy [name]. Alan [name] was last seen on in January 2017 he was doing very well at that time. -No major issues.  Recent Hospitalizations: None  Studies Personally Reviewed - (if available, images/films reviewed: From Epic Chart or Care Everywhere)  None  Interval History: Alan [name] returns today for a very delayed follow-up visit doing very well.  He has not had any cardiac symptoms since last time I saw him.  He is very active with exercise, golf etc.  He has had nothing to slow him down the last year and a half. He questions the need for some of his cardiac medications.  Cardiac review of symptoms: No chest pain or shortness of breath with rest or exertion.  No PND, orthopnea or edema.  No palpitations, lightheadedness, dizziness, weakness or syncope/near syncope. No TIA/amaurosis fugax symptoms. No melena, hematochezia, hematuria, or epstaxis. No claudication.  ROS: A comprehensive was performed. Review of Systems  Constitutional: Negative for fever, malaise/fatigue and weight loss.  HENT: Negative for congestion and nosebleeds.   Respiratory: Negative for cough, shortness of breath and wheezing.   Cardiovascular:       Negative per HPI  Gastrointestinal: Negative for blood in stool, constipation, heartburn and melena.  Genitourinary: Negative for dysuria and hematuria.  Musculoskeletal: Negative for falls and joint pain.  Skin: Negative.   Neurological: Negative for dizziness and weakness.  Endo/Heme/Allergies: Negative for environmental allergies. Does not bruise/bleed easily.  Psychiatric/Behavioral: Negative for memory loss.  The patient is not nervous/anxious and does not have insomnia.     I have reviewed and (if needed) personally updated the patient's problem list, medications, allergies, past medical and surgical history, social and family history.   Past Medical History:  Diagnosis Date  . CAD S/P percutaneous coronary angioplasty 2004   which showed essentially normal LV size and function, moderately dilated left atrium, moderate mitral prolapse with mild to moderate regurgitation.  . Dyslipidemia, goal LDL below 70   . Essential hypertension   . GERD (gastroesophageal reflux disease)   . History of Mitral valve prolapse    Moderate - with moderate MR, noted February t 2013  . History of Severe mitral regurgitation by prior echocardiogram 02/06/2014   TEE: Severe mitral regurgitation with a flail P2 segment and ruptured; Normal LV size & function, dilated LA.  Marland Kitchen History of stress test 12/2009   he walked 9 mins reaching 10 METS. There was an attenuation artifact in the inferior region but no ischemia or infaract, low risk.  . Incidental lung nodule, greater than or equal to 85mm 03/16/2014   Ground glass opacity RML noted on CT scan  . PONV (postoperative nausea and vomiting)   . 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 ST elevation myocardial infarction (STEMI) of anterior wall, subsequent episode of care [facility]) 2004   he had a proximal LAD occlusion treated with 2  overlapping 3.5 x 1.8 mm Cypher DES stents.    Past Surgical History:  Procedure Laterality Date  . ANTERIOR CRUCIATE LIGAMENT REPAIR  1993  . CARDIAC CATHETERIZATION  2004   he had a proximal LAD occlusion treated with 2 overlapping 3.5 x 1.8 mm Cypher DES stents.  Marland Kitchen  CARDIAC CATHETERIZATION  2007; 03/2014   Widely patent LAD stents, 40% distal LAD, 30% Cx, ~50% PL-PDA bifurcation lesion   . COLONOSCOPY    . INTRAOPERATIVE TRANSESOPHAGEAL ECHOCARDIOGRAM N/A 04/08/2014   Procedure: INTRAOPERATIVE TRANSESOPHAGEAL ECHOCARDIOGRAM;  Surgeon: Rexene Alberts, MD;  Location: [place];  Service: Open Heart Surgery;  Laterality: N/A;  . LEFT AND RIGHT HEART CATHETERIZATION WITH CORONARY ANGIOGRAM N/A 03/05/2014   Procedure: LEFT AND RIGHT HEART CATHETERIZATION WITH CORONARY ANGIOGRAM;  Surgeon: Leonie Man, MD;  Location: [facility] CATH LAB;  Service: Cardiovascular;  Laterality: N/A;  . LEFT HEART CATHETERIZATION WITH CORONARY ANGIOGRAM N/A 09/15/2011   Procedure: LEFT HEART CATHETERIZATION WITH CORONARY ANGIOGRAM;  Surgeon: Lorretta Harp, MD;  Location: [facility] CATH LAB;  Service: Cardiovascular;  Laterality: N/A;  . MITRAL VALVE REPAIR Right 04/08/2014   Procedure: MINIMALLY INVASIVE MITRAL VALVE REPAIR (MVR);  Surgeon: Rexene Alberts, MD;  Location: [facility];  Service: Open Heart Surgery;  Laterality: Right;  . NM MYOVIEW LTD  May 2011   Walk 9 minutes, and 10 METs, diaphragmatic attenuation but no ischemia or infarction.  . TEE WITHOUT CARDIOVERSION N/A 02/06/2014   Procedure: TRANSESOPHAGEAL ECHOCARDIOGRAM (TEE);  Surgeon: Pixie Casino, MD;  Normal LV Size U& function - EF 55-60%, no regional WMA.  MV P2 Leaflet is flail with ruptured chord with severe prolapse, anterior leaflet intact.  Severe, eccentric anterior directed MR with dilated LA.  Marland Kitchen TRANSTHORACIC ECHOCARDIOGRAM  February 2013   Mild L. the dilation (likely normal for size) normal function greater than 55% EF. Moderate LA dilation. Moderate mitral prolapse of posterior leaflet with mild to moderate mitral regurgitation. -- No significant change since 2010  . TRANSTHORACIC ECHOCARDIOGRAM  June 30 2015   Low normal LV function with EF 50-55%. GR 1 DD. No MR. Normal gradient of 4 mmHg the mitral valve. No prolapse.     Current Meds  Medication Sig  . aspirin 81 MG tablet Take 81 mg by mouth daily.   . cholecalciferol (VITAMIN D) 1000 UNITS tablet Take 1,000 Units by mouth daily.  . Coenzyme Q10 (CO Q 10) 100 MG CAPS Take 100 mg by mouth daily.   . Multiple Vitamins-Minerals (MULTIVITAMINS THER. W/MINERALS) TABS Take 1 tablet by mouth daily.  . Probiotic Product (PROBIOTIC DAILY PO) Take 1 tablet by mouth daily.  Marland Kitchen zolpidem (AMBIEN) 5 MG tablet TAKE 1 TABLET BY MOUTH EVERY DAY AT BEDTIME AS NEEDED FOR SLEEP  . [DISCONTINUED] clopidogrel (PLAVIX) 75 MG tablet TAKE 1 TABLET BY MOUTH DAILY  . [DISCONTINUED] clopidogrel (PLAVIX) 75 MG tablet TAKE 1 TABLET BY MOUTH DAILY  . [DISCONTINUED] clopidogrel (PLAVIX) 75 MG tablet TAKE 1 TABLET BY MOUTH EVERY DAY. NEED OFFICE VISIT  . [DISCONTINUED] metoprolol tartrate (LOPRESSOR) 25 MG tablet Take 0.5 tablets (12.5 mg total) by mouth 2 (two) times daily.    Allergies  Allergen Reactions  . Amoxicillin Hives  . Other Hives    "CILLINS"  . Penicillins Hives    Social History   Socioeconomic History  . Marital status: Single    Spouse name: None  . Number of children: None  . Years of education: None  . Highest  education level: None  Social Needs  . Financial resource strain: None  . Food insecurity - worry: None  . Food insecurity - inability: None  . Transportation needs - medical: None  . Transportation needs - non-medical: None  Occupational History  . None  Tobacco Use  . Smoking status: Never Smoker  . Smokeless tobacco: Never Used  Substance and Sexual Activity  . Alcohol use: Yes    Comment: social  . Drug use: No  . Sexual activity: None  Other Topics Concern  . None  Social History Narrative   He is a married father of one. Exercises avidly as noted above - runs routinely at least 3 miles 3-4 times a day.  He drinks his Xango fruit juce - 32 Oz. Daily.     Never smoked and only takes occasional alcohol    family history includes  Cancer in his paternal grandmother; Heart Problems in his father; Hypertension in his mother.  Wt Readings from Last 3 Encounters:  07/04/17 185 lb (83.9 kg)  08/24/15 194 lb (88 kg)  06/07/15 196 lb (88.9 kg)    PHYSICAL EXAM BP 117/60   Pulse (!) 59   Ht 6\' 4"  (1.93 m)   Wt 185 lb (83.9 kg)   BMI 22.52 kg/m  Physical Exam  Constitutional: He is oriented to person, place, and time. He appears well-developed and well-nourished. No distress.  Healthy-appearing.  HENT:  Head: Normocephalic and atraumatic.  Eyes: EOM are normal. Pupils are equal, round, and reactive to light.  Neck: Normal range of motion. Neck supple. No hepatojugular reflux and no JVD present. Carotid bruit is not present.  Cardiovascular: Normal rate, regular rhythm and intact distal pulses. Exam reveals no gallop and no friction rub.  Murmur (Very soft SEM and RUSB.) heard. Nondisplaced PMI  Pulmonary/Chest: Effort normal and breath sounds normal. No respiratory distress. He has no wheezes.  Abdominal: Soft. Bowel sounds are normal. He exhibits no distension. There is no tenderness. There is no rebound.  Musculoskeletal: Normal range of motion. He exhibits no edema or deformity.  Neurological: He is alert and oriented to person, place, and time.  Skin: Skin is warm and dry. No rash noted. No erythema.  Psychiatric: He has a normal mood and affect. His behavior is normal. Judgment and thought content normal.  Nursing note and vitals reviewed.    Adult ECG Report  Rate: 59 ;  Rhythm: normal sinus rhythm and Normal axis, intervals and durations.;   Narrative Interpretation: NORMAL EKG - stable   Other studies Reviewed: Additional studies/ records that were reviewed today include:  Recent Labs:  No labs available - followed by PCP.  ASSESSMENT / PLAN: Problem List Items Addressed This Visit    CAD S/P percutaneous coronary angioplasty - DES PCI to occluded LAD during anterior STEMI (Chronic)    Remains  asymptomatic with no further anginal symptoms.  Very active. He is now only taking 12.5 mg of metoprolol once a day.  I think it is fine versus simply stop this now. Continue with the aspirin but okay to stop Plavix.  As such she will only be on aspirin.  Not currently on statin due to patient preference.      Dyslipidemia, goal LDL below 70 (Chronic)    Labs are monitored by PCP.  He is only agreeable to take coenzyme Q 10.  He has had multiple issues with statins in the past.  As long as his lipids are close to control,  he would prefer to not be on another medicine.      Essential hypertension (Chronic)    Really probably not a true diagnosis for him.  He is on minimal dose of metoprolol which we are stopping now.      Relevant Orders   EKG 12-Lead   History of Severe mitral regurgitation (Chronic)   Relevant Orders   ECHOCARDIOGRAM COMPLETE   History of Severe Mitral valve prolapse - severe prolapse of the posterior (P2) leaflet with severe MR - Primary (Chronic)    Status post mitral valve repair in 2015.  He actually noted dramatic improvement in symptoms and breathing issues postoperatively.  He has had no issues since. Last echo was in 2016.  We can follow-up echo for see him back next year.      Relevant Orders   EKG 12-Lead   ECHOCARDIOGRAM COMPLETE   S/P MVR (mitral valve repair)   Relevant Orders   ECHOCARDIOGRAM COMPLETE      Current medicines are reviewed at length with the patient today. (+/- concerns) ?  Can he stop some medications. The following changes have been made:See below   Patient Instructions  Medication Instructions: Discontinue the Metoprolol Discontinue the Plavix  If you need a refill on your cardiac medications before your next appointment, please call your pharmacy.    Procedures/Testing: Your physician has requested that you have an echocardiogram in November 2019. Echocardiography is a painless test that uses sound waves to create  images of your heart. It provides your doctor with information about the size and shape of your heart and how well your heart's chambers and valves are working. This procedure takes approximately one hour. There are no restrictions for this procedure. This will be done at [address], [address]    Follow-Up: Your physician wants you to follow-up in: 12 months with Dr. Ellyn Hack. You will receive a reminder letter in the mail two months in advance. If you don't receive a letter, please call our office at [phone] to schedule this follow-up appointment.   Thank you for choosing Heartcare at [facility]!!        Studies Ordered:   Orders Placed This Encounter  Procedures  . EKG 12-Lead  . ECHOCARDIOGRAM COMPLETE      Glenetta Hew, M.D., M.S. Interventional Cardiologist   Pager # [phone] Phone # [phone] [address] [place], [place] [address]

## 2017-07-04 NOTE — Patient Instructions (Addendum)
Medication Instructions: Discontinue the Metoprolol Discontinue the Plavix  If you need a refill on your cardiac medications before your next appointment, please call your pharmacy.    Procedures/Testing: Your physician has requested that you have an echocardiogram in November 2019. Echocardiography is a painless test that uses sound waves to create images of your heart. It provides your doctor with information about the size and shape of your heart and how well your heart's chambers and valves are working. This procedure takes approximately one hour. There are no restrictions for this procedure. This will be done at [address], [address]    Follow-Up: Your physician wants you to follow-up in: 12 months with Dr. Ellyn Hack. You will receive a reminder letter in the mail two months in advance. If you don't receive a letter, please call our office at [phone] to schedule this follow-up appointment.   Thank you for choosing [facility] at [facility]!!

## 2017-07-06 ENCOUNTER — Encounter: Payer: Self-pay | Admitting: Cardiology

## 2017-07-06 NOTE — Assessment & Plan Note (Signed)
Labs are monitored by PCP.  He is only agreeable to take coenzyme Q 10.  He has had multiple issues with statins in the past.  As long as his lipids are close to control, he would prefer to not be on another medicine.

## 2017-07-06 NOTE — Assessment & Plan Note (Addendum)
Remains asymptomatic with no further anginal symptoms.  Very active. He is now only taking 12.5 mg of metoprolol once a day.  I think it is fine versus simply stop this now. Continue with the aspirin but okay to stop Plavix.  As such she will only be on aspirin.  Not currently on statin due to patient preference.

## 2017-07-06 NOTE — Assessment & Plan Note (Signed)
Status post mitral valve repair in 2015.  He actually noted dramatic improvement in symptoms and breathing issues postoperatively.  He has had no issues since. Last echo was in 2016.  We can follow-up echo for see him back next year.

## 2017-07-06 NOTE — Assessment & Plan Note (Signed)
Really probably not a true diagnosis for him.  He is on minimal dose of metoprolol which we are stopping now.

## 2017-10-11 ENCOUNTER — Other Ambulatory Visit: Payer: Self-pay | Admitting: Cardiology

## 2017-10-11 NOTE — Telephone Encounter (Signed)
° °*  STAT* If patient is at the pharmacy, call can be transferred to refill team.   1. Which medications need to be refilled? (please list name of each medication and dose if known) *zolpidem (AMBIEN) 5 MG tablet   TAKE 1 TABLET BY MOUTH EVERY DAY AT BEDTIME AS NEEDED FOR SLEEP 2. Which pharmacy/location (including street and city if local pharmacy) is medication to be sent to? [id number] - [place], [address]. Do they need a 30 day or 90 day supply?  [place]

## 2017-10-12 ENCOUNTER — Other Ambulatory Visit: Payer: Self-pay | Admitting: *Deleted

## 2017-10-12 MED ORDER — ZOLPIDEM TARTRATE 5 MG PO TABS: ORAL_TABLET | ORAL | 2 refills | Status: DC

## 2017-10-12 NOTE — Progress Notes (Signed)
MEDICATION PHONE IN .- AMBIEN REFILL X2 PER DR HARDING.

## 2017-10-12 NOTE — Telephone Encounter (Signed)
OKAY TO REFILL PER DR HARDING.

## 2018-07-11 ENCOUNTER — Other Ambulatory Visit (HOSPITAL_COMMUNITY): Payer: Federal, State, Local not specified - PPO

## 2018-07-15 ENCOUNTER — Other Ambulatory Visit (HOSPITAL_COMMUNITY): Payer: Federal, State, Local not specified - PPO

## 2018-07-18 ENCOUNTER — Ambulatory Visit (HOSPITAL_COMMUNITY): Payer: Federal, State, Local not specified - PPO | Attending: Cardiovascular Disease

## 2018-07-18 ENCOUNTER — Other Ambulatory Visit: Payer: Self-pay

## 2018-07-18 DIAGNOSIS — Z9889 Other specified postprocedural states: Secondary | ICD-10-CM | POA: Diagnosis not present

## 2018-07-18 DIAGNOSIS — I340 Nonrheumatic mitral (valve) insufficiency: Secondary | ICD-10-CM | POA: Insufficient documentation

## 2018-07-18 DIAGNOSIS — I341 Nonrheumatic mitral (valve) prolapse: Secondary | ICD-10-CM | POA: Diagnosis not present

## 2018-07-18 HISTORY — PX: TRANSTHORACIC ECHOCARDIOGRAM: SHX275

## 2018-07-22 ENCOUNTER — Telehealth: Payer: Self-pay | Admitting: *Deleted

## 2018-07-22 DIAGNOSIS — I340 Nonrheumatic mitral (valve) insufficiency: Secondary | ICD-10-CM

## 2018-07-22 DIAGNOSIS — Z9889 Other specified postprocedural states: Secondary | ICD-10-CM

## 2018-07-22 NOTE — Telephone Encounter (Signed)
The patient has been notified of the result and verbalized understanding.  All questions (if any) were answered. Raiford Simmonds, RN 07/22/2018 12:11 PM  Annual  Office appointment schedule  routed to primary DR [place] 2 YEARS -- [address]

## 2018-07-23 ENCOUNTER — Encounter: Payer: Self-pay | Admitting: Cardiology

## 2018-07-23 ENCOUNTER — Ambulatory Visit: Payer: Federal, State, Local not specified - PPO | Admitting: Cardiology

## 2018-07-23 VITALS — BP 100/80 | HR 91 | Ht 75.0 in | Wt 191.0 lb

## 2018-07-23 DIAGNOSIS — I341 Nonrheumatic mitral (valve) prolapse: Secondary | ICD-10-CM | POA: Diagnosis not present

## 2018-07-23 DIAGNOSIS — I10 Essential (primary) hypertension: Secondary | ICD-10-CM | POA: Diagnosis not present

## 2018-07-23 DIAGNOSIS — Z9889 Other specified postprocedural states: Secondary | ICD-10-CM

## 2018-07-23 DIAGNOSIS — E785 Hyperlipidemia, unspecified: Secondary | ICD-10-CM

## 2018-07-23 DIAGNOSIS — I2510 Atherosclerotic heart disease of native coronary artery without angina pectoris: Secondary | ICD-10-CM

## 2018-07-23 DIAGNOSIS — Z9861 Coronary angioplasty status: Secondary | ICD-10-CM | POA: Diagnosis not present

## 2018-07-23 NOTE — Patient Instructions (Signed)
Medication Instructions:  NOT NEEDED If you need a refill on your cardiac medications before your next appointment, please call your pharmacy.   Lab work: LIPID CMP IN 6 MONTHS -- DO NOT EAT OR DRINK THE MORNING OF TEST WILL MAILLABSLIP If you have labs (blood work) drawn today and your tests are completely normal, you will receive your results only by: Marland Kitchen MyChart Message (if you have MyChart) OR . A paper copy in the mail If you have any lab test that is abnormal or we need to change your treatment, we will call you to review the results.  Testing/Procedures:  IN DEC 2021 - 2 YEARS-- [place] has requested that you have an echocardiogram. Echocardiography is a painless test that uses sound waves to create images of your heart. It provides your doctor with information about the size and shape of your heart and how well your heart's chambers and valves are working. This procedure takes approximately one hour. There are no restrictions for this procedure.    Follow-Up: At [facility], you and your health needs are our priority.  As part of our continuing mission to provide you with exceptional heart care, we have created designated Provider Care Teams.  These Care Teams include your primary Cardiologist (physician) and Advanced Practice Providers (APPs -  Physician Assistants and Nurse Practitioners) who all work together to provide you with the care you need, when you need it. You will need a follow up appointment in 12 months  DEC 2020.  Please call our office 2 months in advance to schedule this appointment.  You may see No primary care provider on file. or one of the following Advanced Practice Providers on your designated Care Team:   Rosaria Ferries, PA-C Jory Sims, DNP, ANP  Your physician recommends that you schedule a follow-up appointment in 6 MONTHS WITH CVRR- CHOLESTEROL  Any Other Special Instructions Will Be Listed Below (If  Applicable).

## 2018-07-23 NOTE — Progress Notes (Signed)
PCP: [name], L.[name], MD  Clinic Note: Chief Complaint  Patient presents with  . Follow-up    No complaints  . Coronary Artery Disease    Status post PCI.  Not on any medications.  . Mitral Valve Prolapse    With regurgitation status post MVR.    HPI: Alan [name] is a 62 y.o. male with a PMH CAD-PCI as well as MVR who presents today for almost 1-year follow-up .   Marland KitchenCAD-PCI to LAD in the setting anterior STEMI as well as minimally invasive mitral repair in August 2015 by Dr. Roxy Manns. Alan [name] was last seen on in no 2018.  Doing well.  Very active with golf.  Not slowing down at all.  No angina or heart failure.  He wanted to stop his medicines.  (Now not on any standing medications)  Recent Hospitalizations: None  Studies Personally Reviewed - (if available, images/films reviewed: From Epic Chart or Care Everywhere)  2D Echo July 18, 2018: Normal LV size and function.  EF 50-60 %.  Unable to assess diastolic function.  Mitral valve sewing ring in place.  Mild stenosis with a gradient of 6 mmHg noted. -->  Stable (follow-up in 2 years)   Interval History: Alan [name] returns today for routine annual follow-up visit still doing well.  He has noted that he has not been doing as much playing golf and other activities that he had been doing, but has still been active.  He has been having some issues with vertigo and dizziness, oftentimes associated with when he is somewhat dehydrated.  As a result, he is not doing as much aggressive golfing as he had been doing before.  Not doing when he is either too hot or too cold.   Relatively stable overall from chronic standpoint: No chest pain/pressure or dyspnea with rest or exertion.  No PND, orthopnea or edema.  Remainder of CARDIAC REVIEW OF SYMPTOMS: No palpitations, lightheadedness, dizziness, weakness or syncope/near syncope. No TIA/amaurosis fugax symptoms. No melena, hematochezia, hematuria, or epstaxis. No claudication.  ROS:  A comprehensive was performed. Review of Systems  Constitutional: Negative for fever, malaise/fatigue and weight loss.  HENT: Negative for congestion and nosebleeds.   Respiratory: Negative for cough, shortness of breath and wheezing.   Cardiovascular:       Negative per HPI  Gastrointestinal: Negative for blood in stool, constipation, heartburn and melena.  Genitourinary: Negative for dysuria and hematuria.  Musculoskeletal: Positive for back pain. Negative for falls and joint pain.  Skin: Negative.   Neurological: Negative for dizziness and weakness.  Endo/Heme/Allergies: Negative for environmental allergies. Does not bruise/bleed easily.  Psychiatric/Behavioral: Negative for memory loss. The patient is not nervous/anxious and does not have insomnia.   All other systems reviewed and are negative.   I have reviewed and (if needed) personally updated the patient's problem list, medications, allergies, past medical and surgical history, social and family history.   Past Medical History:  Diagnosis Date  . CAD S/P percutaneous coronary angioplasty 2004   which showed essentially normal LV size and function, moderately dilated left atrium, moderate mitral prolapse with mild to moderate regurgitation.  . Dyslipidemia, goal LDL below 70   . Essential hypertension   . GERD (gastroesophageal reflux disease)   . History of Mitral valve prolapse    Moderate - with moderate MR, noted February t 2013  . History of Severe mitral regurgitation by prior echocardiogram 02/06/2014   TEE: Severe mitral regurgitation with a flail P2 segment  and ruptured; Normal LV size & function, dilated LA.  Marland Kitchen History of stress test 12/2009   he walked 9 mins reaching 10 METS. There was an attenuation artifact in the inferior region but no ischemia or infaract, low risk.  . Incidental lung nodule, greater than or equal to 67mm 03/16/2014   Ground glass opacity RML noted on CT scan  . PONV (postoperative nausea and  vomiting)   . 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 ST elevation myocardial infarction (STEMI) of anterior wall, subsequent episode of care [facility]) 2004   he had a proximal LAD occlusion treated with 2 overlapping 3.5 x 1.8 mm Cypher DES stents.    Past Surgical History:  Procedure Laterality Date  . ANTERIOR CRUCIATE LIGAMENT REPAIR  1993  . CARDIAC CATHETERIZATION  2004   he had a proximal LAD occlusion treated with 2 overlapping 3.5 x 1.8 mm Cypher DES stents.  Marland Kitchen CARDIAC CATHETERIZATION  2007; 03/2014   Widely patent LAD stents, 40% distal LAD, 30% Cx, ~50% PL-PDA bifurcation lesion   . COLONOSCOPY    . INTRAOPERATIVE TRANSESOPHAGEAL ECHOCARDIOGRAM N/A 04/08/2014   Procedure: INTRAOPERATIVE TRANSESOPHAGEAL ECHOCARDIOGRAM;  Surgeon: Rexene Alberts, MD;  Location: [place];  Service: Open Heart Surgery;  Laterality: N/A;  . LEFT AND RIGHT HEART CATHETERIZATION WITH CORONARY ANGIOGRAM N/A 03/05/2014   Procedure: LEFT AND RIGHT HEART CATHETERIZATION WITH CORONARY ANGIOGRAM;  Surgeon: Leonie Man, MD;  Location: [facility] CATH LAB;  Service: Cardiovascular;  Laterality: N/A;  . LEFT HEART CATHETERIZATION WITH CORONARY ANGIOGRAM N/A 09/15/2011   Procedure: LEFT HEART CATHETERIZATION WITH CORONARY ANGIOGRAM;  Surgeon: Lorretta Harp, MD;  Location: [facility] CATH LAB;  Service: Cardiovascular;  Laterality: N/A;  . MITRAL VALVE REPAIR Right 04/08/2014   Procedure: MINIMALLY INVASIVE MITRAL VALVE REPAIR (MVR);  Surgeon: Rexene Alberts, MD;  Location: [place];  Service: Open Heart Surgery;  Laterality: Right;  . NM MYOVIEW LTD  May 2011   Walk 9 minutes, and 10 METs, diaphragmatic attenuation but no ischemia or infarction.  . TEE WITHOUT  CARDIOVERSION N/A 02/06/2014   Procedure: TRANSESOPHAGEAL ECHOCARDIOGRAM (TEE);  Surgeon: Pixie Casino, MD;  Normal LV Size U& function - EF 55-60%, no regional WMA.  MV P2 Leaflet is flail with ruptured chord with severe prolapse, anterior leaflet intact.  Severe, eccentric anterior directed MR with dilated LA.  Marland Kitchen TRANSTHORACIC ECHOCARDIOGRAM  07/18/2018   Normal LV size and function.  EF 50-60 %.  Unable to assess diastolic function.  Mitral valve sewing ring in place.  Mild stenosis with a gradient of 6 mmHg noted. -  Domingo Dimes ECHOCARDIOGRAM  June 30 2015   Low normal LV function with EF 50-55%. GR 1 DD. No MR. Normal gradient of 4 mmHg the mitral valve. No prolapse.    Current Meds  Medication Sig  . cholecalciferol (VITAMIN D) 1000 UNITS tablet Take 1,000 Units by mouth daily.  . Coenzyme Q10 (CO Q 10) 100 MG CAPS Take 100 mg by mouth daily.   . Multiple Vitamins-Minerals (MULTIVITAMINS THER. W/MINERALS) TABS Take 1 tablet by mouth daily.  . Probiotic Product (PROBIOTIC DAILY PO) Take 1 tablet by mouth daily.  Marland Kitchen zolpidem (AMBIEN) 5 MG tablet TAKE 1 TABLET BY MOUTH EVERY NIGHT AT BEDTIME  AS NEEDED FOR SLEEP    Allergies  Allergen Reactions  . Amoxicillin Hives  . Other Hives    "CILLINS"  . Penicillins Hives    Social History   Tobacco Use  . Smoking status: Never Smoker  . Smokeless tobacco: Never Used  Substance Use Topics  . Alcohol use: Yes    Comment: social  . Drug use: No   Social History   Social History Narrative   He is a married father of one. Exercises avidly as noted above - runs routinely at least 3 miles 3-4 times a day.  He drinks his Xango fruit juce - 32 Oz. Daily.     Never smoked and only takes occasional alcohol   --Had a lot of social stress over the summer time.  Temporally separated from his wife.  Now back together and all is well.  family history includes Cancer in his paternal grandmother; Heart Problems in his father;  Hypertension in his mother.  Wt Readings from Last 3 Encounters:  07/23/18 191 lb (86.6 kg)  07/04/17 185 lb (83.9 kg)  08/24/15 194 lb (88 kg)    PHYSICAL EXAM BP 100/80   Pulse 91   Ht 6\' 3"  (1.905 m)   Wt 191 lb (86.6 kg)   BMI 23.87 kg/m  Physical Exam  Constitutional: He is oriented to person, place, and time. He appears well-developed and well-nourished. No distress.  Healthy-appearing.  Appears younger than stated age.  Well-groomed  HENT:  Head: Normocephalic and atraumatic.  Neck: Normal range of motion. Neck supple. No hepatojugular reflux and no JVD present. Carotid bruit is not present.  Cardiovascular: Normal rate, regular rhythm and intact distal pulses. Exam reveals no gallop and no friction rub.  Murmur (Very soft SEM and RUSB.) heard. Nondisplaced PMI  Pulmonary/Chest: Effort normal and breath sounds normal. No respiratory distress. He has no wheezes.  Abdominal: Soft. Bowel sounds are normal. He exhibits no distension. There is no tenderness. There is no rebound.  Musculoskeletal: Normal range of motion. He exhibits no edema or deformity.  Neurological: He is alert and oriented to person, place, and time.  Skin: Skin is warm and dry. No rash noted. No erythema.  Psychiatric: He has a normal mood and affect. His behavior is normal. Judgment and thought content normal.  Nursing note and vitals reviewed.    Adult ECG Report  Rate: 91;  Rhythm: normal sinus rhythm and Borderline rightward axis, otherwise normal intervals and durations.;   Narrative Interpretation: NORMAL EKG - stable   Other studies Reviewed: Additional studies/ records that were reviewed today include:  Recent Labs:  No results found for: CHOL, HDL, LDLCALC, LDLDIRECT, TRIG, CHOLHDL By [facility](05/23/2018): Total cholesterol 199, triglyceride 94, LDL 131 and HDL 49 .  Glucose 92, BUN/creatinine 16/0.7.  ASSESSMENT / PLAN: Problem List Items Addressed This Visit    CAD S/P percutaneous coronary  angioplasty - DES PCI to occluded LAD during anterior STEMI - Primary (Chronic)    Remains totally asymptomatic with no angina symptoms.  No heart failure symptoms.  No longer on metoprolol. He was post to be on aspirin, but has not been taking.  No longer on Plavix. Has been reluctant to consider statin or other cholesterol lowering agent.  But is taking co-Q10.  He agrees that if he starts having any symptoms he will start taking aspirin again.      Relevant Orders   EKG 12-Lead (Completed)   Comprehensive metabolic panel   Dyslipidemia, goal LDL  below 70 (Chronic)    Labs from October shows LDL is really poorly controlled.  He has not been interested in statins in the past.  I will have him recheck his labs in 6 months and then come in and talk to our pharmacist in the [place] ([facility]) to discuss the possibility of trying a new non-statin medication.      Relevant Orders   Lipid panel   Essential hypertension (Chronic)    Normal blood pressure.  Not on any medications.      Relevant Orders   Comprehensive metabolic panel   History of Severe Mitral valve prolapse - severe prolapse of the posterior (P2) leaflet with severe MR (Chronic)    Follow-up echo showed well-seated mitral ring.  Mild stenosis with slight increase in gradient from 2 years ago. Would be okay for follow-up in about 2 years.      Relevant Orders   EKG 12-Lead (Completed)   S/P minimally invasive mitral valve repair (Chronic)    Stable sewing ring.  Mild stenosis.  Follow-up echo in 2 years.      Relevant Orders   Comprehensive metabolic panel      Current medicines are reviewed at length with the patient today. (+/- concerns) none The following changes have been made:See below   Patient Instructions  Medication Instructions:  NOT NEEDED If you need a refill on your cardiac medications before your next appointment, please call your pharmacy.   Lab work: LIPID CMP IN 6  MONTHS -- DO NOT EAT OR DRINK THE MORNING OF TEST WILL MAILLABSLIP If you have labs (blood work) drawn today and your tests are completely normal, you will receive your results only by: Marland Kitchen MyChart Message (if you have MyChart) OR . A paper copy in the mail If you have any lab test that is abnormal or we need to change your treatment, we will call you to review the results.  Testing/Procedures:  IN DEC 2021 - 2 YEARS-- [facility] has requested that you have an echocardiogram. Echocardiography is a painless test that uses sound waves to create images of your heart. It provides your doctor with information about the size and shape of your heart and how well your heart's chambers and valves are working. This procedure takes approximately one hour. There are no restrictions for this procedure.    Follow-Up: At [facility], you and your health needs are our priority.  As part of our continuing mission to provide you with exceptional heart care, we have created designated Provider Care Teams.  These Care Teams include your primary Cardiologist (physician) and Advanced Practice Providers (APPs -  Physician Assistants and Nurse Practitioners) who all work together to provide you with the care you need, when you need it. You will need a follow up appointment in 12 months  DEC 2020.  Please call our office 2 months in advance to schedule this appointment.  You may see No primary care provider on file. or one of the following Advanced Practice Providers on your designated Care Team:   Rosaria Ferries, PA-C Jory Sims, DNP, ANP  Your physician recommends that you schedule a follow-up appointment in 6 MONTHS WITH [facility]- CHOLESTEROL  Any Other Special Instructions Will Be Listed Below (If Applicable).      Studies Ordered:   Orders Placed This Encounter  Procedures  . Lipid panel  . Comprehensive metabolic panel  . EKG 12-Lead  Glenetta Hew,  M.D., M.S. Interventional Cardiologist   Pager # [phone] Phone # [phone] [address] [place], [place] [address]

## 2018-07-25 ENCOUNTER — Encounter: Payer: Self-pay | Admitting: Cardiology

## 2018-07-25 NOTE — Assessment & Plan Note (Signed)
Stable sewing ring.  Mild stenosis.  Follow-up echo in 2 years.

## 2018-07-25 NOTE — Assessment & Plan Note (Signed)
Follow-up echo showed well-seated mitral ring.  Mild stenosis with slight increase in gradient from 2 years ago. Would be okay for follow-up in about 2 years.

## 2018-07-25 NOTE — Assessment & Plan Note (Signed)
Normal blood pressure.  Not on any medications.

## 2018-07-25 NOTE — Assessment & Plan Note (Signed)
Remains totally asymptomatic with no angina symptoms.  No heart failure symptoms.  No longer on metoprolol. He was post to be on aspirin, but has not been taking.  No longer on Plavix. Has been reluctant to consider statin or other cholesterol lowering agent.  But is taking co-Q10.  He agrees that if he starts having any symptoms he will start taking aspirin again.

## 2018-07-25 NOTE — Assessment & Plan Note (Deleted)
Stable sewing ring.  Mild stenosis.  Follow-up echo in 2 years.

## 2018-07-25 NOTE — Assessment & Plan Note (Signed)
Labs from October shows LDL is really poorly controlled.  He has not been interested in statins in the past.  I will have him recheck his labs in 6 months and then come in and talk to our pharmacist in the [place] ([facility]) to discuss the possibility of trying a new non-statin medication.

## 2018-07-26 ENCOUNTER — Emergency Department (HOSPITAL_COMMUNITY): Payer: Federal, State, Local not specified - PPO

## 2018-07-26 ENCOUNTER — Other Ambulatory Visit: Payer: Self-pay

## 2018-07-26 ENCOUNTER — Emergency Department (HOSPITAL_COMMUNITY)
Admission: EM | Admit: 2018-07-26 | Discharge: 2018-07-26 | Disposition: A | Discharge status: Home / Self Care | Payer: Federal, State, Local not specified - PPO | Attending: Emergency Medicine | Admitting: Emergency Medicine

## 2018-07-26 ENCOUNTER — Encounter (HOSPITAL_COMMUNITY): Payer: Self-pay | Admitting: Emergency Medicine

## 2018-07-26 DIAGNOSIS — I4581 Long QT syndrome: Secondary | ICD-10-CM | POA: Diagnosis not present

## 2018-07-26 DIAGNOSIS — Z79899 Other long term (current) drug therapy: Secondary | ICD-10-CM | POA: Insufficient documentation

## 2018-07-26 DIAGNOSIS — R9431 Abnormal electrocardiogram [ECG] [EKG]: Secondary | ICD-10-CM

## 2018-07-26 DIAGNOSIS — I10 Essential (primary) hypertension: Secondary | ICD-10-CM | POA: Diagnosis not present

## 2018-07-26 DIAGNOSIS — R42 Dizziness and giddiness: Secondary | ICD-10-CM | POA: Insufficient documentation

## 2018-07-26 LAB — BASIC METABOLIC PANEL
Anion gap: 12 (ref 5–15)
BUN: 16 mg/dL (ref 8–23)
CO2: 21 mmol/L — ABNORMAL LOW (ref 22–32)
CREATININE: 0.87 mg/dL (ref 0.61–1.24)
Calcium: 8.8 mg/dL — ABNORMAL LOW (ref 8.9–10.3)
Chloride: 107 mmol/L (ref 98–111)
GFR calc Af Amer: >60 mL/min (ref >60)
GFR calc non Af Amer: >60 mL/min (ref >60)
Glucose, Bld: 129 mg/dL — ABNORMAL HIGH (ref 70–99)
Potassium: 3.6 mmol/L (ref 3.5–5.1)
Sodium: 140 mmol/L (ref 135–145)

## 2018-07-26 LAB — CBC
HCT: 39.9 % (ref 39.0–52.0)
Hemoglobin: 13.1 g/dL (ref 13.0–17.0)
MCH: 31 pg (ref 26.0–34.0)
MCHC: 32.8 g/dL (ref 30.0–36.0)
MCV: 94.3 fL (ref 80.0–100.0)
Platelets: 207 10*3/uL (ref 150–400)
RBC: 4.23 MIL/uL (ref 4.22–5.81)
RDW: 11.7 % (ref 11.5–15.5)
WBC: 13.8 10*3/uL — ABNORMAL HIGH (ref 4.0–10.5)
nRBC: 0 % (ref 0.0–0.2)

## 2018-07-26 LAB — CBG MONITORING, ED: Glucose-Capillary: 144 mg/dL — ABNORMAL HIGH (ref 70–99)

## 2018-07-26 MED ORDER — PROMETHAZINE HCL 25 MG/ML IJ SOLN
25.0000 mg | Freq: Once | INTRAMUSCULAR | Status: AC
  Administered 2018-07-26: 25 mg via INTRAVENOUS
  Filled 2018-07-26: qty 1

## 2018-07-26 MED ORDER — DIAZEPAM 5 MG/ML IJ SOLN
2.5000 mg | Freq: Once | INTRAMUSCULAR | Status: AC
  Administered 2018-07-26: 2.5 mg via INTRAVENOUS
  Filled 2018-07-26: qty 2

## 2018-07-26 MED ORDER — DIAZEPAM 5 MG PO TABS: 5.0000 mg | ORAL_TABLET | Freq: Three times a day (TID) | ORAL | 0 refills | Status: DC | PRN

## 2018-07-26 MED ORDER — MECLIZINE HCL 12.5 MG PO TABS: 12.5000 mg | ORAL_TABLET | Freq: Three times a day (TID) | ORAL | 0 refills | Status: DC | PRN

## 2018-07-26 NOTE — ED Provider Notes (Signed)
[facility] DEPARTMENT Provider Note   CSN: [id number] Arrival date & time: 07/26/18  1257     History   Chief Complaint Chief Complaint  Patient presents with  . Dizziness  . Nausea  . Emesis    HPI Alan [name] is a 62 y.o. male with history of CAD, dyslipidemia, hypertension, MVP, MI presenting today with sudden onset of dizziness.  Patient states that he walked up the stairs today reaching his office when he has had a sudden onset of what the patient describes as a vertigo.  Patient states that his eyes went "sideways "and he brought himself slowly to the ground lying down on his side for several moments before calling his wife to the room.  Patient states that shortly after he developed nausea and nonbloody, nonbilious emesis.  Patient was then seen by EMS who gave 8 mg of Zofran without relief of symptoms.  Upon arrival to emergency department patient still endorsing lightheadedness, dizziness as well as nausea and vomiting.  Patient reports that he has had similar symptoms in the past, diagnosed with vertigo at urgent care which resolved after several days following antihistamines.  Additionally patient reports that he has had an upper respiratory tract infection for the past few weeks, denies fever, chest pain, shortness of breath, abdominal pain, extremity swelling.  Patient states that he was feeling well this morning prior to symptom onset.  HPI  Past Medical History:  Diagnosis Date  . CAD S/P percutaneous coronary angioplasty 2004   which showed essentially normal LV size and function, moderately dilated left atrium, moderate mitral prolapse with mild to moderate regurgitation.  . Dyslipidemia, goal LDL below 70   . Essential hypertension   . GERD (gastroesophageal reflux disease)   . History of Mitral valve prolapse    Moderate - with moderate MR, noted February t 2013  . History of Severe mitral regurgitation by prior echocardiogram  02/06/2014   TEE: Severe mitral regurgitation with a flail P2 segment and ruptured; Normal LV size & function, dilated LA.  Marland Kitchen History of stress test 12/2009   he walked 9 mins reaching 10 METS. There was an attenuation artifact in the inferior region but no ischemia or infaract, low risk.  . Incidental lung nodule, greater than or equal to 85mm 03/16/2014   Ground glass opacity RML noted on CT scan  . PONV (postoperative nausea and vomiting)   . 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 ST elevation myocardial infarction (STEMI) of anterior wall, subsequent episode of care [facility]) 2004   he had a proximal LAD occlusion treated with 2 overlapping 3.5 x 1.8 mm Cypher DES stents.    Patient Active Problem List   Diagnosis Date Noted  . S/P minimally invasive mitral valve repair 04/08/2014  . Incidental lung nodule, greater than or equal to 11mm 03/16/2014  . CAD S/P percutaneous coronary angioplasty - DES PCI to occluded LAD during anterior STEMI 11/29/2013  . History of Severe mitral regurgitation 11/29/2013  . History of Severe Mitral valve prolapse - severe prolapse of the posterior (P2) leaflet with severe MR 11/29/2013  . Essential hypertension   . Dyslipidemia, goal LDL below 70   . METATARSALGIA 03/24/2009    Past Surgical History:  Procedure Laterality Date  . ANTERIOR CRUCIATE LIGAMENT REPAIR  1993  . CARDIAC CATHETERIZATION  2004   he had a proximal LAD occlusion treated with 2 overlapping 3.5 x 1.8 mm Cypher DES stents.  Marland Kitchen CARDIAC CATHETERIZATION  2007; 03/2014   Widely patent LAD stents, 40% distal LAD, 30% Cx, ~50% PL-PDA bifurcation lesion   . COLONOSCOPY    . INTRAOPERATIVE TRANSESOPHAGEAL ECHOCARDIOGRAM N/A  04/08/2014   Procedure: INTRAOPERATIVE TRANSESOPHAGEAL ECHOCARDIOGRAM;  Surgeon: Rexene Alberts, MD;  Location: [place];  Service: Open Heart Surgery;  Laterality: N/A;  . LEFT AND RIGHT HEART CATHETERIZATION WITH CORONARY ANGIOGRAM N/A 03/05/2014   Procedure: LEFT AND RIGHT HEART CATHETERIZATION WITH CORONARY ANGIOGRAM;  Surgeon: Leonie Man, MD;  Location: [facility] CATH LAB;  Service: Cardiovascular;  Laterality: N/A;  . LEFT HEART CATHETERIZATION WITH CORONARY ANGIOGRAM N/A 09/15/2011   Procedure: LEFT HEART CATHETERIZATION WITH CORONARY ANGIOGRAM;  Surgeon: Lorretta Harp, MD;  Location: [facility] CATH LAB;  Service: Cardiovascular;  Laterality: N/A;  . MITRAL VALVE REPAIR Right 04/08/2014   Procedure: MINIMALLY INVASIVE MITRAL VALVE REPAIR (MVR);  Surgeon: Rexene Alberts, MD;  Location: [place];  Service: Open Heart Surgery;  Laterality: Right;  . NM MYOVIEW LTD  May 2011   Walk 9 minutes, and 10 METs, diaphragmatic attenuation but no ischemia or infarction.  . TEE WITHOUT CARDIOVERSION N/A 02/06/2014   Procedure: TRANSESOPHAGEAL ECHOCARDIOGRAM (TEE);  Surgeon: Pixie Casino, MD;  Normal LV Size U& function - EF 55-60%, no regional WMA.  MV P2 Leaflet is flail with ruptured chord with severe prolapse, anterior leaflet intact.  Severe, eccentric anterior directed MR with dilated LA.  Marland Kitchen TRANSTHORACIC ECHOCARDIOGRAM  07/18/2018   Normal LV size and function.  EF 50-60 %.  Unable to assess diastolic function.  Mitral valve sewing ring in place.  Mild stenosis with a gradient of 6 mmHg noted. -  Domingo Dimes ECHOCARDIOGRAM  June 30 2015   Low normal LV function with EF 50-55%. GR 1 DD. No MR. Normal gradient of 4 mmHg the mitral valve. No prolapse.        Home Medications    Prior to Admission medications   Medication Sig Start Date End Date Taking? Authorizing Provider  cholecalciferol (VITAMIN D) 1000 UNITS tablet Take 1,000 Units by mouth daily.    [provider]  Coenzyme Q10 (CO Q  10) 100 MG CAPS Take 100 mg by mouth daily.     [provider]  diazepam (VALIUM) 5 MG tablet Take 1 tablet (5 mg total) by mouth every 8 (eight) hours as needed (Vertigo). 07/26/18   Deliah Boston, PA-C  meclizine (ANTIVERT) 12.5 MG tablet Take 1 tablet (12.5 mg total) by mouth 3 (three) times daily as needed for dizziness. 07/26/18   Nuala Alpha A, PA-C  Multiple Vitamins-Minerals (MULTIVITAMINS THER. W/MINERALS) TABS Take 1 tablet by mouth daily.    [provider]  Probiotic Product (PROBIOTIC DAILY PO) Take 1 tablet by mouth daily.    [provider]  zolpidem (AMBIEN) 5 MG tablet TAKE 1 TABLET BY MOUTH EVERY NIGHT AT BEDTIME AS NEEDED FOR SLEEP 10/12/17   Leonie Man, MD    Family History Family History  Problem Relation Age of Onset  . Hypertension Mother   . Heart Problems Father        triple bypass 1989  . Cancer Paternal Grandmother        Pancreatic    Social History Social History  Tobacco Use  . Smoking status: Never Smoker  . Smokeless tobacco: Never Used  Substance Use Topics  . Alcohol use: Yes    Comment: social  . Drug use: No     Allergies   Amoxicillin; Other; and Penicillins   Review of Systems Review of Systems  Constitutional: Negative.  Negative for chills and fever.  HENT: Negative.  Negative for rhinorrhea and sore throat.   Eyes: Positive for visual disturbance. Negative for photophobia and pain.  Respiratory: Negative.  Negative for cough and shortness of breath.   Cardiovascular: Negative.  Negative for chest pain.  Gastrointestinal: Positive for nausea and vomiting. Negative for abdominal pain and diarrhea.  Genitourinary: Negative.  Negative for dysuria and hematuria.  Musculoskeletal: Negative.  Negative for arthralgias and myalgias.  Skin: Negative.  Negative for rash.  Neurological: Positive for dizziness and light-headedness. Negative for syncope, facial asymmetry, speech difficulty, weakness,  numbness and headaches.   Physical Exam Updated Vital Signs BP 119/90   Pulse 91   Temp (!) 97.2 F (36.2 C) (Oral)   Resp 14   Ht 6\' 3"  (1.905 m)   Wt 86 kg   SpO2 95%   BMI 23.70 kg/m   Physical Exam Constitutional:      General: He is not in acute distress.    Appearance: He is well-developed.  HENT:     Head: Normocephalic and atraumatic.     Comments: Patient refusing positional changes as this would exacerbate his symptoms.    Right Ear: Hearing, tympanic membrane, ear canal and external ear normal.     Left Ear: Hearing, tympanic membrane, ear canal and external ear normal.     Nose: Nose normal.     Mouth/Throat:     Lips: Pink.     Mouth: Mucous membranes are moist.     Pharynx: Oropharynx is clear. Uvula midline.  Eyes:     General: Vision grossly intact. Gaze aligned appropriately.     Extraocular Movements: Extraocular movements intact.     Conjunctiva/sclera: Conjunctivae normal.     Pupils: Pupils are equal, round, and reactive to light.     Visual Fields: Right eye visual fields normal and left eye visual fields normal.     Comments: Skew grossly absent No nystagmus on my examination however patient is refusing to move his head and is not experiencing dizziness at this time. Patient refuses head impulse test.  Neck:     Musculoskeletal: Full passive range of motion without pain, normal range of motion and neck supple.     Trachea: Trachea and phonation normal. No tracheal deviation.  Cardiovascular:     Rate and Rhythm: Normal rate and regular rhythm.     Pulses: Normal pulses.          Radial pulses are 2+ on the right side and 2+ on the left side.       Dorsalis pedis pulses are 2+ on the right side and 2+ on the left side.       Posterior tibial pulses are 2+ on the right side and 2+ on the left side.     Heart sounds: Normal heart sounds.  Pulmonary:     Effort: Pulmonary effort is normal. No respiratory distress.     Breath sounds: Normal breath  sounds and air entry.  Chest:     Chest wall: No deformity, tenderness or crepitus.  Abdominal:     General: Bowel sounds are normal.     Palpations: Abdomen  is soft.     Tenderness: There is no abdominal tenderness. There is no guarding or rebound.  Musculoskeletal: Normal range of motion.     Right lower leg: Normal.     Left lower leg: Normal.  Feet:     Right foot:     Protective Sensation: 3 sites tested. 3 sites sensed.     Left foot:     Protective Sensation: 3 sites tested. 3 sites sensed.  Skin:    General: Skin is warm and dry.     Capillary Refill: Capillary refill takes less than 2 seconds.  Neurological:     Mental Status: He is alert and oriented to person, place, and time.     GCS: GCS eye subscore is 4. GCS verbal subscore is 5. GCS motor subscore is 6.     Cranial Nerves: Cranial nerves are intact.     Sensory: Sensation is intact.     Motor: Motor function is intact.     Coordination: Coordination is intact.     Comments: Mental Status: Alert, oriented, thought content appropriate, able to give a coherent history. Speech fluent without evidence of aphasia. Able to follow 2 step commands without difficulty. Cranial Nerves: II: Peripheral visual fields grossly normal, pupils equal, round, reactive to light III,IV, VI: ptosis not present, extra-ocular motions intact bilaterally V,VII: smile symmetric, eyebrows raise symmetric, facial light touch sensation equal VIII: hearing grossly normal to voice X: uvula elevates symmetrically XI: bilateral shoulder shrug symmetric and strong XII: midline tongue extension without fassiculations Motor: Normal tone. 5/5 strength in upper and lower extremities bilaterally including strong and equal grip strength and dorsiflexion/plantar flexion Sensory: Sensation intact to light touch in all extremities. Cerebellar: normal finger-to-nose with bilateral upper extremities. Normal heel-to -shin balance bilaterally of the  lower extremity. No pronator drift.  Gait: normal gait and balance CV: distal pulses palpable throughout  Psychiatric:        Mood and Affect: Mood and affect normal.        Speech: Speech normal.        Behavior: Behavior normal.     ED Treatments / Results  Labs (all labs ordered are listed, but only abnormal results are displayed) Labs Reviewed  BASIC METABOLIC PANEL - Abnormal; Notable for the following components:      Result Value   CO2 21 (*)    Glucose, Bld 129 (*)    Calcium 8.8 (*)    All other components within normal limits  CBC - Abnormal; Notable for the following components:   WBC 13.8 (*)    All other components within normal limits  CBG MONITORING, ED - Abnormal; Notable for the following components:   Glucose-Capillary 144 (*)    All other components within normal limits    EKG EKG Interpretation  Date/Time:  Friday July 26 2018 15:22:10 EST Ventricular Rate:  86 PR Interval:    QRS Duration: 87 QT Interval:  420 QTC Calculation: 503 R Axis:   79 Text Interpretation:  Sinus rhythm Probable left atrial enlargement Prolonged QT interval Confirmed by Virgel Manifold [phone]) on 07/26/2018 3:32:58 PM   Radiology Ct Head Wo Contrast  Result Date: 07/26/2018 CLINICAL DATA:  Woke up early this morning with lightheadedness and dizziness as well as nausea and vomiting. EXAM: CT HEAD WITHOUT CONTRAST TECHNIQUE: Contiguous axial images were obtained from the base of the skull through the vertex without intravenous contrast. COMPARISON:  None. FINDINGS: Brain: Ventricles, cisterns and other CSF spaces are  normal. There is no mass, mass effect, shift of midline structures or acute hemorrhage. No evidence of acute infarction. Prominent perivascular space over the region inferior to the left lentiform nucleus. Vascular: No hyperdense vessel or unexpected calcification. Skull: Normal. Negative for fracture or focal lesion. Sinuses/Orbits: Orbits are normal. Paranasal  sinuses are well developed and well aerated. Mastoid air cells are clear. Other: None. IMPRESSION: No acute findings. Electronically Signed   By: Marin Olp M.D.   On: 07/26/2018 16:18    Procedures Procedures (including critical care time)  Medications Ordered in ED Medications  promethazine (PHENERGAN) injection 25 mg (25 mg Intravenous Given 07/26/18 1348)  diazepam (VALIUM) injection 2.5 mg (2.5 mg Intravenous Given 07/26/18 1352)    Initial Impression / Assessment and Plan / ED Course  I have reviewed the triage vital signs and the nursing notes.  Pertinent labs & imaging results that were available during my care of the patient were reviewed by me and considered in my medical decision making (see chart for details).    62 year old male presenting today with sudden onset dizziness, nausea and vomiting following positional change.  Patient symptoms today most consistent with a peripheral cause, likely vertigo. No red flag features for central vertigo to include gradual onset, vertical/bidirectional or nonfatigable nystagmus, or focal neuro deficit.     Patient's dizziness, nausea and vomiting resolved following Valium and Phenergan today.  Patient is now ambulatory on department without assistance or difficulty.  BMP nonacute CBC with mild leukocytosis, no infectious symptoms CBG nonacute CT head without acute findings  Of note patient noted to have prolonged QT interval on ECG today, was given 8 mg of Zofran by EMS prior to arrival.  Second EKG today shows improvement of QT prolongation.  Patient informed of QT prolongation and to follow-up with primary care provider for reassessment on Monday and possible repeat EKG.  Patient has been discharged with meclizine for peripheral vertigo and diazepam for severe symptoms.  At this time there does not appear to be any evidence of an acute emergency medical condition and the patient appears stable for discharge with appropriate  outpatient follow up. Diagnosis was discussed with patient who verbalizes understanding of care plan and is agreeable to discharge. I have discussed return precautions with patient and wife at bedside who verbalize understanding of return precautions. Patient strongly encouraged to follow-up with their PCP on Monday regarding vertigo at QT. All questions answered.  Patient was seen and evaluated by Dr. Sabra Heck during this visit who agrees with discharge at this time with meclizine, Valium and PCP follow-up regarding vertigo and QT prolongation.  Note: Portions of this report may have been transcribed using voice recognition software. Every effort was made to ensure accuracy; however, inadvertent computerized transcription errors may still be present. Final Clinical Impressions(s) / ED Diagnoses   Final diagnoses:  Vertigo  Prolonged Q-T interval on ECG    ED Discharge Orders         Ordered    diazepam (VALIUM) 5 MG tablet  Every 8 hours PRN     07/26/18 1632    meclizine (ANTIVERT) 12.5 MG tablet  3 times daily PRN     07/26/18 1632           Deliah Boston, PA-C 07/26/18 1726    Noemi Chapel, MD 07/27/18 [phone]

## 2018-07-26 NOTE — ED Triage Notes (Signed)
Arrived via EMS from home. Patient woke up middle of the night at 0400 having a bad dream and was lightheaded and dizzy. States at 1130 developed nausea and emesis. Denies pain alert answering and following commands appropriate. EMS administered Zofran 8mg  with little to no relief per patient. States still feels lightheaded and has a history of vertigo.

## 2018-07-26 NOTE — Discharge Instructions (Signed)
You have been diagnosed today with Vertigo.  At this time there does not appear to be the presence of an emergent medical condition, however there is always the potential for conditions to change. Please read and follow the below instructions.  Please return to the Emergency Department immediately for any new or worsening symptoms or if your symptoms return and do not improve following medication Please be sure to follow up with your Primary Care Provider on [facility] regarding your visit today; please call their office to schedule an appointment even if you are feeling better for a follow-up visit. You may use the antihistamine meclizine as prescribed for vertigo symptoms.  You may use the medication Valium for severe symptoms as prescribed. Your lecture cardiogram today showed prolonged QT interval, this was likely caused by medications given to you for nausea today.  This should resolve spontaneously with cessation of QT prolonging drugs however it is important for you to follow-up with your primary care provider or cardiologist for follow-up regarding this.  Contact a health care provider if: Your medicines do not relieve your vertigo or they make it worse. You have a fever. Your condition gets worse or you develop new symptoms. Your family or friends notice any behavioral changes. Your nausea or vomiting gets worse. You have numbness or a pins and needles sensation in part of your body. Get help right away if: You have difficulty moving or speaking. You are always dizzy. You faint. You develop severe headaches. You have weakness in your hands, arms, or legs. You have changes in your hearing or vision. You develop a stiff neck. You develop sensitivity to light. Get help right away if: You have chest pain or difficulty breathing. You have a fluttering feeling in your chest. You faint. You have a seizure. Please read the additional information packets attached to your discharge  summary.  Do not take your medicine if  develop an itchy rash, swelling in your mouth or lips, or difficulty breathing.

## 2018-07-26 NOTE — ED Provider Notes (Signed)
Medical screening examination/treatment/procedure(s) were conducted as a shared visit with non-physician practitioner(s) and myself.  I personally evaluated the patient during the encounter.  Clinical Impression:   Final diagnoses:  Vertigo  Prolonged Q-T interval on ECG    The patient is a pleasant 62 year old male, states that he had an upper respiratory illness for the last couple of weeks which is gradually improved, he also reports that he has had a couple of episodes of fairly severe vertigo in the past 6 months, each 1 lasted for couple of days and then resolved, worse with movement.  He reports that he had acute onset this time of dizziness with feeling like his vision was abnormal, he laid down on the ground, he had continual symptoms which were severe and caused multiple episodes of vomiting.  He comes in by ambulance after getting Phenergan and 2 doses of Zofran with only minimal improvement.  He does not want to move his head because this induces the symptoms and has induced the symptoms in the times where he has had this in the past.  He was evaluated at the urgent care in the past and was given antihistamines with some improvement over several days.  No numbness weakness changes in speech or facial droop, his wife is at the bedside and corroborates the patient's story and how normal he looks at this time.  The patient refuses to move his head, he does not want to comply with the exam to look for nystagmus, he has a normal neurologic exam otherwise.  CT scan of the brain to rule out other causes, Phenergan, small dose of Valium, reevaluate.  Patient agreeable.  Seems consistent with a benign cause of inner ear vertigo   Noemi Chapel, MD 07/27/18 [phone]

## 2018-07-26 NOTE — ED Notes (Signed)
Patient verbalizes understanding of discharge instructions. Opportunity for questioning and answers were provided. Armband removed by staff, pt discharged from ED. Pt wheeled to lobby. 

## 2018-07-26 NOTE — ED Notes (Signed)
ED Provider at bedside. 

## 2018-12-24 ENCOUNTER — Telehealth: Payer: Self-pay | Admitting: *Deleted

## 2018-12-24 NOTE — Telephone Encounter (Signed)
-----   Message from Raiford Simmonds, RN sent at 07/23/2018  3:16 PM EST ----- LABS DUE @June  10,2020 -- LIPID,CMP  MAIL LABSLIP @ MAY 11,2020

## 2018-12-24 NOTE — Telephone Encounter (Addendum)
Labslip and letter mailed  01/03/19

## 2019-01-03 ENCOUNTER — Other Ambulatory Visit: Payer: Self-pay | Admitting: *Deleted

## 2019-01-03 DIAGNOSIS — I2510 Atherosclerotic heart disease of native coronary artery without angina pectoris: Secondary | ICD-10-CM

## 2019-01-03 DIAGNOSIS — I10 Essential (primary) hypertension: Secondary | ICD-10-CM

## 2019-01-03 DIAGNOSIS — E785 Hyperlipidemia, unspecified: Secondary | ICD-10-CM

## 2019-01-03 DIAGNOSIS — Z9889 Other specified postprocedural states: Secondary | ICD-10-CM

## 2019-01-03 DIAGNOSIS — Z9861 Coronary angioplasty status: Secondary | ICD-10-CM

## 2019-03-18 ENCOUNTER — Other Ambulatory Visit: Payer: Self-pay | Admitting: Family Medicine

## 2019-03-18 ENCOUNTER — Ambulatory Visit
Admission: RE | Admit: 2019-03-18 | Discharge: 2019-03-18 | Disposition: A | Discharge status: Home / Self Care | Payer: Federal, State, Local not specified - PPO | Source: Ambulatory Visit | Attending: Family Medicine | Admitting: Family Medicine

## 2019-03-18 DIAGNOSIS — R0781 Pleurodynia: Secondary | ICD-10-CM

## 2019-04-18 ENCOUNTER — Ambulatory Visit
Admission: RE | Admit: 2019-04-18 | Discharge: 2019-04-18 | Disposition: A | Discharge status: Home / Self Care | Payer: Federal, State, Local not specified - PPO | Source: Ambulatory Visit | Attending: Family Medicine | Admitting: Family Medicine

## 2019-04-18 ENCOUNTER — Other Ambulatory Visit: Payer: Self-pay | Admitting: Family Medicine

## 2019-04-18 DIAGNOSIS — R9389 Abnormal findings on diagnostic imaging of other specified body structures: Secondary | ICD-10-CM

## 2019-04-25 ENCOUNTER — Other Ambulatory Visit: Payer: Self-pay | Admitting: Family Medicine

## 2019-04-25 DIAGNOSIS — R9389 Abnormal findings on diagnostic imaging of other specified body structures: Secondary | ICD-10-CM

## 2019-04-30 ENCOUNTER — Other Ambulatory Visit: Payer: Self-pay

## 2019-04-30 ENCOUNTER — Ambulatory Visit
Admission: RE | Admit: 2019-04-30 | Discharge: 2019-04-30 | Disposition: A | Discharge status: Home / Self Care | Payer: Federal, State, Local not specified - PPO | Source: Ambulatory Visit | Attending: Family Medicine | Admitting: Family Medicine

## 2019-04-30 DIAGNOSIS — R9389 Abnormal findings on diagnostic imaging of other specified body structures: Secondary | ICD-10-CM

## 2019-04-30 MED ORDER — IOPAMIDOL (ISOVUE-300) INJECTION 61%
75.0000 mL | Freq: Once | INTRAVENOUS | Status: AC | PRN
  Administered 2019-04-30: 75 mL via INTRAVENOUS

## 2019-05-08 ENCOUNTER — Ambulatory Visit: Payer: Federal, State, Local not specified - PPO | Admitting: Pulmonary Disease

## 2019-05-08 ENCOUNTER — Telehealth: Payer: Self-pay

## 2019-05-08 ENCOUNTER — Other Ambulatory Visit: Payer: Self-pay

## 2019-05-08 ENCOUNTER — Encounter: Payer: Self-pay | Admitting: Pulmonary Disease

## 2019-05-08 VITALS — BP 118/64 | HR 80 | Temp 97.9°F | Ht 76.0 in | Wt 190.4 lb

## 2019-05-08 DIAGNOSIS — R918 Other nonspecific abnormal finding of lung field: Secondary | ICD-10-CM

## 2019-05-08 DIAGNOSIS — C3490 Malignant neoplasm of unspecified part of unspecified bronchus or lung: Secondary | ICD-10-CM

## 2019-05-08 LAB — CBC WITH DIFFERENTIAL/PLATELET
Basophils Absolute: 0 10*3/uL (ref 0.0–0.1)
Basophils Relative: 0.7 % (ref 0.0–3.0)
Eosinophils Absolute: 0.5 10*3/uL (ref 0.0–0.7)
Eosinophils Relative: 7.2 % — ABNORMAL HIGH (ref 0.0–5.0)
HCT: 40.5 % (ref 39.0–52.0)
Hemoglobin: 13.5 g/dL (ref 13.0–17.0)
Lymphocytes Relative: 18.9 % (ref 12.0–46.0)
Lymphs Abs: 1.3 10*3/uL (ref 0.7–4.0)
MCHC: 33.4 g/dL (ref 30.0–36.0)
MCV: 92.5 fl (ref 78.0–100.0)
Monocytes Absolute: 0.6 10*3/uL (ref 0.1–1.0)
Monocytes Relative: 8.9 % (ref 3.0–12.0)
Neutro Abs: 4.6 10*3/uL (ref 1.4–7.7)
Neutrophils Relative %: 64.3 % (ref 43.0–77.0)
Platelets: 272 10*3/uL (ref 150.0–400.0)
RBC: 4.38 Mil/uL (ref 4.22–5.81)
RDW: 12.9 % (ref 11.5–15.5)
WBC: 7.1 10*3/uL (ref 4.0–10.5)

## 2019-05-08 LAB — PROTIME-INR
INR: 1.2 ratio — ABNORMAL HIGH (ref 0.8–1.0)
Prothrombin Time: 13.6 s — ABNORMAL HIGH (ref 9.6–13.1)

## 2019-05-08 NOTE — Patient Instructions (Signed)
We have schedule you for bronchoscope on October 2 at 1 PM at [facility].  Please arrive 2 hours before procedure We will get some labs today including CBC, PT/INR and schedule a PET scan Follow-up in 2 to 4 weeks.

## 2019-05-08 NOTE — Progress Notes (Signed)
Alan [name]    [id number]    05-01-1956  Primary Care Physician:Mitchell, L.[name], MD  Referring Physician: [name], Carlean Jews.Marlou [name], [address] [employer] [place],  [place] [address]  Chief complaint: Consult for lung mass  HPI: Alan [name] has history of mitral valve repair, coronary artery disease Referred here for evaluation of right middle lobe mass  Patient removes remote history of aspiration on magnesium and melatonin pills around 2013.  He choked on the pill and coughed up fragments of pill.  This episode repeated again in 2018 Was evaluated at primary care in July 2020 for cough, dyspnea.  He had a right middle lobe opacity on chest x-ray and treated with doxycycline, prednisone.  COVID testing at that time was negative  As he has persistent symptoms he had a repeat chest x-ray and CT scan which demonstrated a right middle lobe mass with mediastinal lymphadenopathy and has been referred here for further evaluation. Chief complaint today is cough, dyspnea, chest tightness and sharp pain while coughing.  No fevers or chills, hemoptysis  Pets: Has a dog.  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  He has feather pillows for the past 10 years. Smoking history: Never smoker Travel history: Traveled to [place] in 2016.  No significant recent travel Relevant family history: No significant family history of lung disease  Outpatient Encounter Medications as of 05/08/2019  Medication Sig  . cholecalciferol (VITAMIN D) 1000 UNITS tablet Take 1,000 Units by mouth daily.  . Coenzyme Q10 (CO Q 10) 100 MG CAPS Take 100 mg by mouth daily.   . diazepam (VALIUM) 5 MG tablet Take 1 tablet (5 mg total) by mouth every 8 (eight) hours as needed (Vertigo).  . meclizine (ANTIVERT) 12.5 MG tablet Take 1 tablet (12.5 mg total) by mouth 3 (three) times daily as needed for dizziness.  . Multiple  Vitamins-Minerals (MULTIVITAMINS THER. W/MINERALS) TABS Take 1 tablet by mouth daily.  . Probiotic Product (PROBIOTIC DAILY PO) Take 1 tablet by mouth daily.  Marland Kitchen zolpidem (AMBIEN) 5 MG tablet TAKE 1 TABLET BY MOUTH EVERY NIGHT AT BEDTIME AS NEEDED FOR SLEEP   No facility-administered encounter medications on file as of 05/08/2019.     Allergies as of 05/08/2019 - Review Complete 05/08/2019  Allergen Reaction Noted  . Amoxicillin Hives 03/05/2014  . Other Hives 03/05/2014  . Penicillins Hives 03/24/2009    Past Medical History:  Diagnosis Date  . CAD S/P percutaneous coronary angioplasty 2004   which showed essentially normal LV size and function, moderately dilated left atrium, moderate mitral prolapse with mild to moderate regurgitation.  . Dyslipidemia, goal LDL below 70   . Essential hypertension   . GERD (gastroesophageal reflux disease)   . History of Mitral valve prolapse    Moderate - with moderate MR, noted February t 2013  . History of Severe mitral regurgitation by prior echocardiogram 02/06/2014   TEE: Severe mitral regurgitation with a flail P2 segment and ruptured; Normal LV size & function, dilated LA.  Marland Kitchen History of stress test 12/2009   he walked 9 mins reaching 10 METS. There was an attenuation artifact in the inferior region but no ischemia or infaract, low risk.  . Incidental lung nodule, greater than or equal to 41mm 03/16/2014   Ground glass opacity RML noted on CT scan  . PONV (postoperative nausea and vomiting)   . 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 ST elevation myocardial infarction (STEMI) of anterior wall, subsequent episode of care [facility]) 2004   he had a proximal LAD occlusion treated  with 2 overlapping 3.5 x 1.8 mm Cypher DES stents.    Past Surgical History:  Procedure Laterality Date  . ANTERIOR CRUCIATE LIGAMENT REPAIR  1993  . CARDIAC CATHETERIZATION  2004   he had a proximal LAD occlusion treated with 2 overlapping 3.5 x 1.8 mm Cypher DES stents.  Marland Kitchen CARDIAC CATHETERIZATION  2007; 03/2014   Widely patent LAD stents, 40% distal LAD, 30% Cx, ~50% PL-PDA bifurcation lesion   . COLONOSCOPY    . INTRAOPERATIVE TRANSESOPHAGEAL ECHOCARDIOGRAM N/A 04/08/2014   Procedure: INTRAOPERATIVE TRANSESOPHAGEAL ECHOCARDIOGRAM;  Surgeon: Alan Alberts, MD;  Location: [place];  Service: Open Heart Surgery;  Laterality: N/A;  . LEFT AND RIGHT HEART CATHETERIZATION WITH CORONARY ANGIOGRAM N/A 03/05/2014   Procedure: LEFT AND RIGHT HEART CATHETERIZATION WITH CORONARY ANGIOGRAM;  Surgeon: Alan Man, MD;  Location: [facility] CATH LAB;  Service: Cardiovascular;  Laterality: N/A;  . LEFT HEART CATHETERIZATION WITH CORONARY ANGIOGRAM N/A 09/15/2011   Procedure: LEFT HEART CATHETERIZATION WITH CORONARY ANGIOGRAM;  Surgeon: Alan Harp, MD;  Location: [facility] CATH LAB;  Service: Cardiovascular;  Laterality: N/A;  . MITRAL VALVE REPAIR Right 04/08/2014   Procedure: MINIMALLY INVASIVE MITRAL VALVE REPAIR (MVR);  Surgeon: Alan Alberts, MD;  Location: [place];  Service: Open Heart Surgery;  Laterality: Right;  . NM MYOVIEW LTD  May 2011   Walk 9 minutes, and 10 METs, diaphragmatic attenuation but no ischemia or infarction.  . TEE WITHOUT CARDIOVERSION N/A 02/06/2014   Procedure: TRANSESOPHAGEAL ECHOCARDIOGRAM (TEE);  Surgeon: Alan Casino, MD;  Normal LV Size U& function - EF 55-60%, no regional WMA.  MV P2 Leaflet is flail with ruptured chord with severe prolapse, anterior leaflet intact.  Severe, eccentric anterior directed MR with dilated LA.  Marland Kitchen TRANSTHORACIC ECHOCARDIOGRAM  07/18/2018   Normal LV size and function.  EF 50-60 %.  Unable to assess diastolic function.  Mitral valve sewing ring in place.   Mild stenosis with a gradient of 6 mmHg noted. -  Domingo Dimes ECHOCARDIOGRAM  June 30 2015   Low normal LV function with EF 50-55%. GR 1 DD. No MR. Normal gradient of 4 mmHg the mitral valve. No prolapse.    Family History  Problem Relation Age of Onset  . Hypertension Mother   . Heart Problems Father        triple bypass 1989  . Cancer Paternal Grandmother        Pancreatic    Social History   Socioeconomic History  . Marital status: Single    Spouse name: Not on file  . Number of children: Not on file  . Years of education: Not on file  . Highest education level: Not on file  Occupational History  . Not on file  Social Needs  . Financial resource strain: Not on file  . Food insecurity    Worry: Not on file    Inability: Not on file  . Transportation needs    Medical: Not on file    Non-medical: Not on file  Tobacco Use  . Smoking status: Never Smoker  .  Smokeless tobacco: Never Used  Substance and Sexual Activity  . Alcohol use: Yes    Comment: social  . Drug use: No  . Sexual activity: Not on file  Lifestyle  . Physical activity    Days per week: Not on file    Minutes per session: Not on file  . Stress: Not on file  Relationships  . Social Herbalist on phone: Not on file    Gets together: Not on file    Attends religious service: Not on file    Active member of club or organization: Not on file    Attends meetings of clubs or organizations: Not on file    Relationship status: Not on file  . Intimate partner violence    Fear of current or ex partner: Not on file    Emotionally abused: Not on file    Physically abused: Not on file    Forced sexual activity: Not on file  Other Topics Concern  . Not on file  Social History Narrative   He is a married father of one. Exercises avidly as noted above - runs routinely at least 3 miles 3-4 times a day.  He drinks his Xango fruit juce - 32 Oz. Daily.     Never smoked and only takes occasional  alcohol    Review of systems: Review of Systems  Constitutional: Negative for fever and chills.  HENT: Negative.   Eyes: Negative for blurred vision.  Respiratory: as per HPI  Cardiovascular: Negative for chest pain and palpitations.  Gastrointestinal: Negative for vomiting, diarrhea, blood per rectum. Genitourinary: Negative for dysuria, urgency, frequency and hematuria.  Musculoskeletal: Negative for myalgias, back pain and joint pain.  Skin: Negative for itching and rash.  Neurological: Negative for dizziness, tremors, focal weakness, seizures and loss of consciousness.  Endo/Heme/Allergies: Negative for environmental allergies.  Psychiatric/Behavioral: Negative for depression, suicidal ideas and hallucinations.  All other systems reviewed and are negative.  Physical Exam: Blood pressure 118/64, pulse 80, temperature 97.9 F (36.6 C), temperature source Temporal, height 6\' 4"  (1.93 m), weight 190 lb 6.4 oz (86.4 kg), SpO2 95 %. Gen:      No acute distress HEENT:  EOMI, sclera anicteric Neck:     No masses; no thyromegaly Lungs:    Clear to auscultation bilaterally; normal respiratory effort CV:         Regular rate and rhythm; no murmurs Abd:      + bowel sounds; soft, non-tender; no palpable masses, no distension Ext:    No edema; adequate peripheral perfusion Skin:      Warm and dry; no rash Neuro: alert and oriented x 3 Psych: normal mood and affect  Data Reviewed: Imaging: CT chest 11/30/2014- middle lobe groundglass opacity CT chest 06/07/2015- persistent right middle lobe opacity without change. CT chest 04/30/2019- right infrahilar mass with narrowing of the right middle lobe bronchus.  Volume loss and atelectasis in the right middle lobe.  Mild atelectasis in the right lower lobe.  Paratracheal, subcarinal, right supra clavicular lymphadenopathy.  I have reviewed the images personally  Assessment:  Lung mass CT reviewed with right middle lobe infrahilar mass with  mediastinal lymphadenopathy.  He does have a small supraclavicular node.  Findings are suspicious for malignancy even though he is a non-smoker.  Previously noted to have a right middle lobe nodule in 2016 but current mass is in a different area and is likely not related.  He has remote history of pill  aspiration.  Discussed PET scan and assessment of supraclavicular node versus bronchoscopy and we have decided to proceed with bronchoscopy as that will enable airway inspection as well. Risks benefits of procedure discussed in detail with patient and wife over telephone Check CBC and PT/INR Procedure scheduled for 10/2 at [facility].  More then 1/2 the time of the 40 min visit was spent in counseling and/or coordination of care with the patient and family.  Plan/Recommendations: - Bronchoscope with endobronchial ultrasound on 10/2 - PET scan - CBC, PT/INR  Marshell Garfinkel MD [facility] 05/08/2019, 9:13 AM  CC: Alan Dust, L.[name], MD

## 2019-05-08 NOTE — Progress Notes (Signed)
cbc

## 2019-05-08 NOTE — Telephone Encounter (Signed)
Patient is scheduled for a Bronchoscopy 05/16/2019 at 1pm. Please schedule patient to have Covid testing for this procedure. Patient is aware that he will be contacted to have this done. Thank you

## 2019-05-09 NOTE — Telephone Encounter (Signed)
I was going to schedule patient for covid testing but someone outside of our office has already set this up. Nothing further needed.   Patient was scheduled for covid testing by: Clifton Custard

## 2019-05-13 ENCOUNTER — Telehealth: Payer: Self-pay | Admitting: Cardiology

## 2019-05-13 ENCOUNTER — Other Ambulatory Visit (HOSPITAL_COMMUNITY)
Admission: RE | Admit: 2019-05-13 | Discharge: 2019-05-13 | Disposition: A | Discharge status: Home / Self Care | Payer: Federal, State, Local not specified - PPO | Source: Ambulatory Visit | Attending: Pulmonary Disease | Admitting: Pulmonary Disease

## 2019-05-13 DIAGNOSIS — Z01812 Encounter for preprocedural laboratory examination: Secondary | ICD-10-CM | POA: Diagnosis present

## 2019-05-13 DIAGNOSIS — R918 Other nonspecific abnormal finding of lung field: Secondary | ICD-10-CM | POA: Insufficient documentation

## 2019-05-13 DIAGNOSIS — Z20828 Contact with and (suspected) exposure to other viral communicable diseases: Secondary | ICD-10-CM | POA: Insufficient documentation

## 2019-05-13 MED ORDER — ZOLPIDEM TARTRATE 5 MG PO TABS: ORAL_TABLET | ORAL | 2 refills | Status: DC

## 2019-05-13 NOTE — Telephone Encounter (Signed)
New Message   *STAT* If patient is at the pharmacy, call can be transferred to refill team.   1. Which medications need to be refilled? (please list name of each medication and dose if known) zolpidem (AMBIEN) 5 MG tablet  2. Which pharmacy/location (including street and city if local pharmacy) is medication to be sent to? [id number] - [place], [place] - [address] AT [address]  3. Do they need a 30 day or 90 day supply? 90 day

## 2019-05-13 NOTE — Telephone Encounter (Signed)
Spoke with Dr Ellyn Hack and he stated patient can have 90 day supply with 2 additional refills. Rx called into pharmacy and left message with refill instructions. Contacted patient and made him aware as well and he voiced understanding.

## 2019-05-13 NOTE — Telephone Encounter (Signed)
Yes  Glenetta Hew, MD

## 2019-05-14 LAB — NOVEL CORONAVIRUS, NAA (HOSP ORDER, SEND-OUT TO REF LAB; TAT 18-24 HRS): SARS-CoV-2, NAA: NOT DETECTED

## 2019-05-15 ENCOUNTER — Telehealth: Payer: Self-pay | Admitting: Pulmonary Disease

## 2019-05-15 ENCOUNTER — Other Ambulatory Visit: Payer: Self-pay

## 2019-05-15 ENCOUNTER — Ambulatory Visit (HOSPITAL_COMMUNITY)
Admission: RE | Admit: 2019-05-15 | Discharge: 2019-05-15 | Disposition: A | Discharge status: Home / Self Care | Payer: Federal, State, Local not specified - PPO | Source: Ambulatory Visit | Attending: Pulmonary Disease | Admitting: Pulmonary Disease

## 2019-05-15 ENCOUNTER — Encounter (HOSPITAL_COMMUNITY): Payer: Self-pay | Admitting: *Deleted

## 2019-05-15 DIAGNOSIS — C3490 Malignant neoplasm of unspecified part of unspecified bronchus or lung: Secondary | ICD-10-CM | POA: Insufficient documentation

## 2019-05-15 LAB — GLUCOSE, CAPILLARY: Glucose-Capillary: 101 mg/dL — ABNORMAL HIGH (ref 70–99)

## 2019-05-15 MED ORDER — FLUDEOXYGLUCOSE F - 18 (FDG) INJECTION
9.1000 | Freq: Once | INTRAVENOUS | Status: AC | PRN
  Administered 2019-05-15: 07:00:00 9.1 via INTRAVENOUS

## 2019-05-15 NOTE — Progress Notes (Signed)
Alan [name] denies chest pain or shortness of breath. Patient was tested for Covid and has been in quarantine since that time. I instructed patient to stop all vitamins and supplements.

## 2019-05-15 NOTE — Telephone Encounter (Signed)
Call report on PET scan dated today 05/15/19   Impression:  IMPRESSION: 1. Hypermetabolic nodule in the in the RIGHT lower lobe along the fissure may represent primary bronchogenic carcinoma. 2. Enlarged hypermetabolic RIGHT hilar node/nodule is favored metastatic bronchogenic carcinoma node versus primary lesion. 3. 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 No supraclavicular adenopathy. 4. No evidence distant metastatic disease. 5. Peripheral consolidation in the RIGHT lower lobe and thickening along the fissures is favored chronic inflammatory or infectious process.  If multidisciplinary follow up management is desired, this is available in the [facility] through the [facility] [phone].  These results will be called to the ordering clinician or representative by the Radiologist Assistant, and communication documented in the PACS or zVision Dashboard.   Electronically Signed   By: Suzy Bouchard M.D.   On: 05/15/2019 09:15

## 2019-05-16 ENCOUNTER — Other Ambulatory Visit: Payer: Self-pay

## 2019-05-16 ENCOUNTER — Ambulatory Visit (HOSPITAL_COMMUNITY): Payer: Federal, State, Local not specified - PPO | Admitting: Certified Registered"

## 2019-05-16 ENCOUNTER — Encounter (HOSPITAL_COMMUNITY)
Admission: RE | Disposition: A | Discharge status: Home / Self Care | Payer: Self-pay | Source: Home / Self Care | Attending: Pulmonary Disease

## 2019-05-16 ENCOUNTER — Encounter (HOSPITAL_COMMUNITY): Payer: Self-pay

## 2019-05-16 ENCOUNTER — Ambulatory Visit (HOSPITAL_COMMUNITY): Payer: Federal, State, Local not specified - PPO

## 2019-05-16 ENCOUNTER — Ambulatory Visit (HOSPITAL_COMMUNITY)
Admission: RE | Admit: 2019-05-16 | Discharge: 2019-05-16 | Disposition: A | Discharge status: Home / Self Care | Payer: Federal, State, Local not specified - PPO | Attending: Pulmonary Disease | Admitting: Pulmonary Disease

## 2019-05-16 DIAGNOSIS — Z955 Presence of coronary angioplasty implant and graft: Secondary | ICD-10-CM | POA: Insufficient documentation

## 2019-05-16 DIAGNOSIS — J90 Pleural effusion, not elsewhere classified: Secondary | ICD-10-CM | POA: Diagnosis not present

## 2019-05-16 DIAGNOSIS — Z79899 Other long term (current) drug therapy: Secondary | ICD-10-CM | POA: Diagnosis not present

## 2019-05-16 DIAGNOSIS — I10 Essential (primary) hypertension: Secondary | ICD-10-CM | POA: Diagnosis not present

## 2019-05-16 DIAGNOSIS — Z880 Allergy status to penicillin: Secondary | ICD-10-CM | POA: Diagnosis not present

## 2019-05-16 DIAGNOSIS — I2510 Atherosclerotic heart disease of native coronary artery without angina pectoris: Secondary | ICD-10-CM | POA: Diagnosis not present

## 2019-05-16 DIAGNOSIS — J984 Other disorders of lung: Secondary | ICD-10-CM | POA: Insufficient documentation

## 2019-05-16 DIAGNOSIS — Z888 Allergy status to other drugs, medicaments and biological substances status: Secondary | ICD-10-CM | POA: Diagnosis not present

## 2019-05-16 DIAGNOSIS — Z800 Family history of malignant neoplasm of digestive organs: Secondary | ICD-10-CM | POA: Diagnosis not present

## 2019-05-16 DIAGNOSIS — I252 Old myocardial infarction: Secondary | ICD-10-CM | POA: Diagnosis not present

## 2019-05-16 DIAGNOSIS — K219 Gastro-esophageal reflux disease without esophagitis: Secondary | ICD-10-CM | POA: Diagnosis not present

## 2019-05-16 DIAGNOSIS — R918 Other nonspecific abnormal finding of lung field: Secondary | ICD-10-CM

## 2019-05-16 DIAGNOSIS — C342 Malignant neoplasm of middle lobe, bronchus or lung: Secondary | ICD-10-CM | POA: Insufficient documentation

## 2019-05-16 DIAGNOSIS — Z8249 Family history of ischemic heart disease and other diseases of the circulatory system: Secondary | ICD-10-CM | POA: Insufficient documentation

## 2019-05-16 DIAGNOSIS — R590 Localized enlarged lymph nodes: Secondary | ICD-10-CM | POA: Diagnosis not present

## 2019-05-16 DIAGNOSIS — Z9889 Other specified postprocedural states: Secondary | ICD-10-CM

## 2019-05-16 HISTORY — PX: VIDEO BRONCHOSCOPY WITH ENDOBRONCHIAL ULTRASOUND: SHX6177

## 2019-05-16 LAB — BASIC METABOLIC PANEL
Anion gap: 11 (ref 5–15)
BUN: 12 mg/dL (ref 8–23)
CO2: 24 mmol/L (ref 22–32)
Calcium: 9.4 mg/dL (ref 8.9–10.3)
Chloride: 105 mmol/L (ref 98–111)
Creatinine, Ser: 0.66 mg/dL (ref 0.61–1.24)
GFR calc Af Amer: >60 mL/min (ref >60)
GFR calc non Af Amer: >60 mL/min (ref >60)
Glucose, Bld: 95 mg/dL (ref 70–99)
Potassium: 3.6 mmol/L (ref 3.5–5.1)
Sodium: 140 mmol/L (ref 135–145)

## 2019-05-16 SURGERY — BRONCHOSCOPY, WITH EBUS
Anesthesia: General

## 2019-05-16 MED ORDER — FENTANYL CITRATE (PF) 100 MCG/2ML IJ SOLN: 25.0000 ug | INTRAMUSCULAR | Status: DC | PRN

## 2019-05-16 MED ORDER — FENTANYL CITRATE (PF) 250 MCG/5ML IJ SOLN
INTRAMUSCULAR | Status: DC | PRN
  Administered 2019-05-16: 100 ug via INTRAVENOUS
  Administered 2019-05-16: 50 ug via INTRAVENOUS

## 2019-05-16 MED ORDER — FENTANYL CITRATE (PF) 250 MCG/5ML IJ SOLN
INTRAMUSCULAR | Status: AC
  Filled 2019-05-16: qty 5

## 2019-05-16 MED ORDER — LIDOCAINE 2% (20 MG/ML) 5 ML SYRINGE
INTRAMUSCULAR | Status: AC
  Filled 2019-05-16: qty 5

## 2019-05-16 MED ORDER — PROPOFOL 10 MG/ML IV BOLUS
INTRAVENOUS | Status: DC | PRN
  Administered 2019-05-16: 30 mg via INTRAVENOUS
  Administered 2019-05-16: 200 mg via INTRAVENOUS

## 2019-05-16 MED ORDER — ROCURONIUM BROMIDE 10 MG/ML (PF) SYRINGE
PREFILLED_SYRINGE | INTRAVENOUS | Status: DC | PRN
  Administered 2019-05-16: 60 mg via INTRAVENOUS

## 2019-05-16 MED ORDER — ONDANSETRON HCL 4 MG/2ML IJ SOLN
INTRAMUSCULAR | Status: AC
  Filled 2019-05-16: qty 2

## 2019-05-16 MED ORDER — MEPERIDINE HCL 25 MG/ML IJ SOLN: 6.2500 mg | INTRAMUSCULAR | Status: DC | PRN

## 2019-05-16 MED ORDER — LACTATED RINGERS IV SOLN
INTRAVENOUS | Status: DC
  Administered 2019-05-16: 11:00:00 via INTRAVENOUS

## 2019-05-16 MED ORDER — DEXAMETHASONE SODIUM PHOSPHATE 10 MG/ML IJ SOLN
INTRAMUSCULAR | Status: DC | PRN
  Administered 2019-05-16: 5 mg via INTRAVENOUS

## 2019-05-16 MED ORDER — 0.9 % SODIUM CHLORIDE (POUR BTL) OPTIME
TOPICAL | Status: DC | PRN
  Administered 2019-05-16: 1000 mL

## 2019-05-16 MED ORDER — ROCURONIUM BROMIDE 10 MG/ML (PF) SYRINGE
PREFILLED_SYRINGE | INTRAVENOUS | Status: AC
  Filled 2019-05-16: qty 10

## 2019-05-16 MED ORDER — PROPOFOL 10 MG/ML IV BOLUS
INTRAVENOUS | Status: AC
  Filled 2019-05-16: qty 20

## 2019-05-16 MED ORDER — MIDAZOLAM HCL 2 MG/2ML IJ SOLN: 0.5000 mg | Freq: Once | INTRAMUSCULAR | Status: DC | PRN

## 2019-05-16 MED ORDER — ONDANSETRON HCL 4 MG/2ML IJ SOLN
INTRAMUSCULAR | Status: DC | PRN
  Administered 2019-05-16: 4 mg via INTRAVENOUS

## 2019-05-16 MED ORDER — SUGAMMADEX SODIUM 200 MG/2ML IV SOLN
INTRAVENOUS | Status: DC | PRN
  Administered 2019-05-16: 200 mg via INTRAVENOUS

## 2019-05-16 MED ORDER — LIDOCAINE 2% (20 MG/ML) 5 ML SYRINGE
INTRAMUSCULAR | Status: DC | PRN
  Administered 2019-05-16: 30 mg via INTRAVENOUS

## 2019-05-16 MED ORDER — PROMETHAZINE HCL 25 MG/ML IJ SOLN: 6.2500 mg | INTRAMUSCULAR | Status: DC | PRN

## 2019-05-16 SURGICAL SUPPLY — 36 items
ADAPTER VALVE BIOPSY EBUS (MISCELLANEOUS) IMPLANT
ADPTR VALVE BIOPSY EBUS (MISCELLANEOUS) ×2
BRUSH CYTOL CELLEBRITY 1.5X140 (MISCELLANEOUS) IMPLANT
CANISTER SUCT 3000ML PPV (MISCELLANEOUS) ×3 IMPLANT
CONT SPEC 4OZ CLIKSEAL STRL BL (MISCELLANEOUS) ×3 IMPLANT
COVER BACK TABLE 60X90IN (DRAPES) ×3 IMPLANT
FILTER STRAW FLUID ASPIR (MISCELLANEOUS) IMPLANT
FORCEPS BIOP RJ4 1.8 (CUTTING FORCEPS) IMPLANT
GAUZE SPONGE 4X4 12PLY STRL (GAUZE/BANDAGES/DRESSINGS) ×3 IMPLANT
GLOVE BIO SURGEON STRL SZ7.5 (GLOVE) ×3 IMPLANT
GOWN STRL REUS W/ TWL LRG LVL3 (GOWN DISPOSABLE) ×2 IMPLANT
GOWN STRL REUS W/TWL LRG LVL3 (GOWN DISPOSABLE) ×4
KIT CLEAN ENDO COMPLIANCE (KITS) ×6 IMPLANT
KIT TURNOVER KIT B (KITS) ×3 IMPLANT
MARKER SKIN DUAL TIP RULER LAB (MISCELLANEOUS) ×3 IMPLANT
NDL ASPIRATION VIZISHOT 19G (NEEDLE) IMPLANT
NDL ASPIRATION VIZISHOT 21G (NEEDLE) ×1 IMPLANT
NEEDLE ASPIRATION VIZISHOT 19G (NEEDLE) IMPLANT
NEEDLE ASPIRATION VIZISHOT 21G (NEEDLE) ×3 IMPLANT
NS IRRIG 1000ML POUR BTL (IV SOLUTION) ×3 IMPLANT
OIL SILICONE PENTAX (PARTS (SERVICE/REPAIRS)) ×3 IMPLANT
PAD ARMBOARD 7.5X6 YLW CONV (MISCELLANEOUS) ×6 IMPLANT
STOPCOCK 4 WAY LG BORE MALE ST (IV SETS) ×2 IMPLANT
SYR 20ML ECCENTRIC (SYRINGE) ×9 IMPLANT
SYR 20ML LL LF (SYRINGE) ×2 IMPLANT
SYR 3ML LL SCALE MARK (SYRINGE) IMPLANT
SYR 5ML LUER SLIP (SYRINGE) ×3 IMPLANT
TOWEL GREEN STERILE FF (TOWEL DISPOSABLE) ×3 IMPLANT
TRAP SPECIMEN MUCOUS 40CC (MISCELLANEOUS) IMPLANT
TUBE CONNECTING 20'X1/4 (TUBING) ×1
TUBE CONNECTING 20X1/4 (TUBING) ×2 IMPLANT
TUBING EXTENTION W/L.L. (IV SETS) ×2 IMPLANT
VALVE BIOPSY  SINGLE USE (MISCELLANEOUS) ×4
VALVE BIOPSY SINGLE USE (MISCELLANEOUS) ×1 IMPLANT
VALVE SUCTION BRONCHIO DISP (MISCELLANEOUS) ×3 IMPLANT
WATER STERILE IRR 1000ML POUR (IV SOLUTION) ×3 IMPLANT

## 2019-05-16 NOTE — Anesthesia Procedure Notes (Addendum)
Procedure Name: Intubation Date/Time: 05/16/2019 2:43 PM Performed by: Myna Bright, CRNA Pre-anesthesia Checklist: Patient identified, Emergency Drugs available, Suction available and Patient being monitored Patient Re-evaluated:Patient Re-evaluated prior to induction Oxygen Delivery Method: Circle system utilized Preoxygenation: Pre-oxygenation with 100% oxygen Induction Type: IV induction Ventilation: Mask ventilation without difficulty Laryngoscope Size: Mac and 4 Grade View: Grade III Tube type: Oral Tube size: 8.5 mm Number of attempts: 2 Airway Equipment and Method: Bougie stylet Placement Confirmation: ETT inserted through vocal cords under direct vision,  positive ETCO2 and breath sounds checked- equal and bilateral Secured at: 22 cm Tube secured with: Tape Dental Injury: Teeth and Oropharynx as per pre-operative assessment  Difficulty Due To: Difficult Airway- due to anterior larynx Comments: Easy mask airway. Grade III view with MAC 4 blade. DL x 1 by CRNA, partial view of cords, but unable to pass ETT. DL x 1 by MD, partial view of cords, passed bougie and ETT over bougie. Atraumatic intubation, SaO2 never dropped below 99%. Recommend Glidescope for future intubations.

## 2019-05-16 NOTE — Op Note (Signed)
Video Bronchoscopy with Endobronchial Ultrasound Procedure Note  Date of Operation: 05/16/2019  Pre-op Diagnosis: Lung mass and mediastinal LAD  Post-op Diagnosis: Same  Surgeon: Marshell Garfinkel  Assistants:   Anesthesia: General endotracheal anesthesia  Operation: Flexible video fiberoptic bronchoscopy with endobronchial ultrasound and biopsies.  Estimated Blood Loss: Minimal  Complications: None apparent  Indications and History: Alan [name] is a 63 y.o. male with right lung mass and mediastinal, hilar lymphadenopathy. Recommendation made to achieve tissue sampling via endobronchial ultrasound guided nodal biopsies and also navigational biopsies of the distal opacities. The risks, benefits, complications, treatment options and expected outcomes were discussed with the patient.  The possibilities of pneumothorax, pneumonia, reaction to medication, pulmonary aspiration, perforation of a viscus, bleeding, failure to diagnose a condition and creating a complication requiring transfusion or operation were discussed with the patient who freely signed the consent.    Description of Procedure: The patient was examined in the preoperative area and history and data from the preprocedure consultation were reviewed. It was deemed appropriate to proceed.  The patient was taken to OR 8, identified as Alan [name] and the procedure verified as Flexible Video Fiberoptic Bronchoscopy.  A Time Out was held and the above information confirmed. After being taken to the operating room general anesthesia was initiated and the patient  was orally intubated. The video fiberoptic bronchoscope was introduced via the endotracheal tube and a general inspection was performed which showed normal airway anatomy. The standard scope was then withdrawn and the endobronchial ultrasound was used to identify and characterize the peritracheal, hilar and bronchial lymph nodes. Inspection showed enlargement of  station 7 and 11R nodes. Using real-time ultrasound guidance Wang needle biopsies were take from Station 7 and 11R  nodes and were sent for cytology.  Verified with onsite pathology that there was enough tissue to make a diagnosis  At the end of the procedure a general airway inspection was performed and there was no evidence of active bleeding. The bronchoscope was removed. The patient tolerated the procedure well without apparent complications. There was no significant blood loss. The bronchoscope was withdrawn. Anesthesia was reversed and the patient was taken to the PACU for recovery.   Samples: 1. Wang needle biopsies from 7 node 2. Wang needle biopsies from 11R node  Plans:  The patient will be discharged from the PACU to home when recovered from anesthesia and after chest x-ray is reviewed. We will review the cytology results with the patient when they become available. Outpatient followup will be with Dr Vaughan Browner.   Marshell Garfinkel MD [facility] 05/16/2019, 4:03 PM

## 2019-05-16 NOTE — Anesthesia Postprocedure Evaluation (Signed)
Anesthesia Post Note  Patient: BREK REECE  Procedure(s) Performed: VIDEO BRONCHOSCOPY WITH ENDOBRONCHIAL ULTRASOUND (N/A )     Patient location during evaluation: PACU Anesthesia Type: General Level of consciousness: awake and alert Pain management: pain level controlled Vital Signs Assessment: post-procedure vital signs reviewed and stable Respiratory status: spontaneous breathing, nonlabored ventilation, respiratory function stable and patient connected to nasal cannula oxygen Cardiovascular status: blood pressure returned to baseline and stable Postop Assessment: no apparent nausea or vomiting Anesthetic complications: no    Last Vitals:  Vitals:   05/16/19 1615 05/16/19 1630  BP: 126/90 136/90  Pulse: 89 84  Resp: 12 13  Temp:  36.4 C  SpO2: 100% 98%    Last Pain:  Vitals:   05/16/19 1630  TempSrc:   PainSc: 0-No pain                 Effie Berkshire

## 2019-05-16 NOTE — Transfer of Care (Signed)
Immediate Anesthesia Transfer of Care Note  Patient: Alan [name]  Procedure(s) Performed: VIDEO BRONCHOSCOPY WITH ENDOBRONCHIAL ULTRASOUND (N/A )  Patient Location: PACU  Anesthesia Type:General  Level of Consciousness: awake, alert , oriented and patient cooperative  Airway & Oxygen Therapy: Patient Spontanous Breathing and Patient connected to face mask oxygen  Post-op Assessment: Report given to RN, Post -op Vital signs reviewed and stable and Patient moving all extremities  Post vital signs: Reviewed and stable  Last Vitals:  Vitals Value Taken Time  BP 130/92 05/16/19 1559  Temp    Pulse 86 05/16/19 1603  Resp 11 05/16/19 1603  SpO2 97 % 05/16/19 1603  Vitals shown include unvalidated device data.  Last Pain:  Vitals:   05/16/19 1119  TempSrc: Oral  PainSc:       Patients Stated Pain Goal: 2 (14/48/18 5631)  Complications: No apparent anesthesia complications

## 2019-05-16 NOTE — H&P (Signed)
Alan [name]    [id number]    Aug 11, 1956  Primary Care Physician:Alan [name], Alan Sa, MD  Referring Physician: No referring [name] defined for this encounter.  Chief complaint: Eval for lung mass  HPI: [name] has history of mitral valve repair, coronary artery disease Referred here for evaluation of right middle lobe mass  Patient removes remote history of aspiration on magnesium and melatonin pills around 2013.  He choked on the pill and coughed up fragments of pill.  This episode repeated again in 2018 Was evaluated at primary care in July 2020 for cough, dyspnea.  He had a right middle lobe opacity on chest x-ray and treated with doxycycline, prednisone.  COVID testing at that time was negative  As he has persistent symptoms he had a repeat chest x-ray and CT scan which demonstrated a right middle lobe mass with mediastinal lymphadenopathy and has been referred here for further evaluation. Chief complaint today is cough, dyspnea, chest tightness and sharp pain while coughing.  Alan fevers or chills, hemoptysis  Pets: Has a dog.  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  He has feather pillows for the past 10 years. Smoking history: Never smoker Travel history: Traveled to [place] in 2016.  Alan significant recent travel Relevant family history: Alan significant family history of lung disease  Outpatient Encounter Medications as of 05/08/2019  Medication Sig  . cholecalciferol (VITAMIN D) 1000 UNITS tablet Take 1,000 Units by mouth daily.  . Coenzyme Q10 (CO Q 10) 100 MG CAPS Take 100 mg by mouth daily.   . diazepam (VALIUM) 5 MG tablet Take 1 tablet (5 mg total) by mouth every 8 (eight) hours as needed (Vertigo).  . meclizine (ANTIVERT) 12.5 MG tablet Take 1 tablet (12.5 mg total) by mouth 3 (three) times daily as needed for dizziness.  . Multiple Vitamins-Minerals (MULTIVITAMINS THER.  W/MINERALS) TABS Take 1 tablet by mouth daily.  . Probiotic Product (PROBIOTIC DAILY PO) Take 1 tablet by mouth daily.  Marland Kitchen zolpidem (AMBIEN) 5 MG tablet TAKE 1 TABLET BY MOUTH EVERY NIGHT AT BEDTIME AS NEEDED FOR SLEEP   Alan facility-administered encounter medications on file as of 05/08/2019.    Physical Exam: Blood pressure (!) 146/91, pulse 81, temperature 98 F (36.7 C), temperature source Oral, resp. rate 18, height 6\' 4"  (1.93 m), weight 86.2 kg, SpO2 100 %. Gen:      Alan acute distress HEENT:  EOMI, sclera anicteric Neck:     Alan masses; Alan thyromegaly Lungs:    Clear to auscultation bilaterally; normal respiratory effort CV:         Regular rate and rhythm; Alan murmurs Abd:      + bowel sounds; soft, non-tender; Alan palpable masses, Alan distension Ext:    Alan edema; adequate peripheral perfusion Skin:      Warm and dry; Alan rash Neuro: alert and oriented x 3 Psych: normal mood and affect  Data Reviewed: Imaging: CT chest 11/30/2014- middle lobe groundglass opacity CT chest 06/07/2015- persistent right middle lobe opacity without change. CT chest 04/30/2019- right infrahilar mass with narrowing of the right middle lobe bronchus.  Volume loss and atelectasis in the right middle lobe.  Mild atelectasis in the right lower lobe.  Paratracheal, subcarinal, right supra clavicular lymphadenopathy.   PET scan 37/01/2830- hypermetabolic right lower lobe lung nodule with positive right hilar, mediastinal lymph nodes.  Alan supraclavicular adenopathy.  I have reviewed the images personally.  Assessment:  Lung mass CT and PET reviewed with right middle lobe infrahilar mass with mediastinal lymphadenopathy. Findings are suspicious for malignancy even though he is a non-smoker.  Previously noted to have a right middle lobe nodule in 2016 but current mass is in a different area and is likely not related.  He has remote history of pill aspiration.  He is here for endobronchial ultrasound bronchoscopy and  biopsy. All risks/benefit reviewed in detail with patient and he has agreed to proceed  Plan/Recommendations: - Proceed with bronchoscope with endobronchial ultrasound  Marshell Garfinkel MD [facility] 05/16/2019, 11:52 AM  CC: Alan ref. [name] found

## 2019-05-16 NOTE — Anesthesia Preprocedure Evaluation (Addendum)
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   breath sounds clear to auscultation       Cardiovascular hypertension, (-) angina+ CAD, + Past MI and + Cardiac Stents  + Valvular Problems/Murmurs (s/p MV repair 2015)  Rhythm:Regular Rate:Normal  '19 ECHO: EF 50-55%, mild MS, trivial MR   Neuro/Psych negative neurological ROS     GI/Hepatic negative GI ROS, Neg liver ROS,   Endo/Other  negative endocrine ROS  Renal/GU negative Renal ROS     Musculoskeletal   Abdominal   Peds  Hematology negative hematology ROS (+)   Anesthesia Other Findings   Reproductive/Obstetrics                            Anesthesia Physical Anesthesia Plan  ASA: III  Anesthesia Plan: General   Post-op Pain Management:    Induction: Intravenous  PONV Risk Score and Plan: 3 and Ondansetron, Dexamethasone and Treatment may vary due to age or medical condition  Airway Management Planned: Oral ETT  Additional Equipment:   Intra-op Plan:   Post-operative Plan: Extubation in OR  Informed Consent: I have reviewed the patients History and Physical, chart, labs and discussed the procedure including the risks, benefits and alternatives for the proposed anesthesia with the patient or authorized representative who has indicated his/her understanding and acceptance.     Dental advisory given  Plan Discussed with: CRNA and Surgeon  Anesthesia Plan Comments:        Anesthesia Quick Evaluation

## 2019-05-17 ENCOUNTER — Encounter (HOSPITAL_COMMUNITY): Payer: Self-pay | Admitting: Pulmonary Disease

## 2019-05-20 LAB — CYTOLOGY - NON PAP

## 2019-05-21 ENCOUNTER — Other Ambulatory Visit: Payer: Self-pay

## 2019-05-21 DIAGNOSIS — C3490 Malignant neoplasm of unspecified part of unspecified bronchus or lung: Secondary | ICD-10-CM

## 2019-05-26 ENCOUNTER — Telehealth: Payer: Self-pay | Admitting: *Deleted

## 2019-05-26 ENCOUNTER — Telehealth: Payer: Self-pay | Admitting: Pulmonary Disease

## 2019-05-26 DIAGNOSIS — R918 Other nonspecific abnormal finding of lung field: Secondary | ICD-10-CM

## 2019-05-26 NOTE — Telephone Encounter (Signed)
Checked referral & saw that Dr Inda Merlin has made a note and sent to [facility] to schedule.  I called pt & he states he received a call from oncology 2 hrs ago.  Nothing further needed.

## 2019-05-26 NOTE — Telephone Encounter (Signed)
Patient referred to Medical Oncology by Dr. Vaughan Browner.  I called patient to arrange appointment for [place] on Thursday, 05/29/2019 and instructed him to arrive at 1:15 PM for labs at 1:30 PM.  We discussed that he would be seeing Dr. Julien Nordmann at 2:00 PM and Dr. Sondra Come at 3:00 PM.  Patient verbalized and was able to read back instructions.  We discussed that he would not be able to have a visitor but that we can call or face time his wife during his appointment.  I encouraged patient to call back if he has any questions.

## 2019-05-29 ENCOUNTER — Encounter: Payer: Self-pay | Admitting: *Deleted

## 2019-05-29 ENCOUNTER — Inpatient Hospital Stay: Payer: Federal, State, Local not specified - PPO | Attending: Internal Medicine

## 2019-05-29 ENCOUNTER — Encounter: Payer: Self-pay | Admitting: Internal Medicine

## 2019-05-29 ENCOUNTER — Telehealth: Payer: Self-pay | Admitting: Internal Medicine

## 2019-05-29 ENCOUNTER — Other Ambulatory Visit: Payer: Self-pay | Admitting: *Deleted

## 2019-05-29 ENCOUNTER — Ambulatory Visit
Admission: RE | Admit: 2019-05-29 | Discharge: 2019-05-29 | Disposition: A | Discharge status: Home / Self Care | Payer: Federal, State, Local not specified - PPO | Source: Ambulatory Visit | Attending: Radiation Oncology | Admitting: Radiation Oncology

## 2019-05-29 ENCOUNTER — Other Ambulatory Visit: Payer: Self-pay

## 2019-05-29 ENCOUNTER — Inpatient Hospital Stay (HOSPITAL_BASED_OUTPATIENT_CLINIC_OR_DEPARTMENT_OTHER): Payer: Federal, State, Local not specified - PPO | Admitting: Internal Medicine

## 2019-05-29 ENCOUNTER — Inpatient Hospital Stay: Payer: Federal, State, Local not specified - PPO

## 2019-05-29 VITALS — BP 130/85 | HR 85 | Temp 98.7°F | Resp 17 | Wt 184.2 lb

## 2019-05-29 VITALS — BP 130/85 | HR 85 | Temp 98.7°F | Resp 17 | Ht 76.0 in | Wt 184.2 lb

## 2019-05-29 DIAGNOSIS — I10 Essential (primary) hypertension: Secondary | ICD-10-CM | POA: Insufficient documentation

## 2019-05-29 DIAGNOSIS — C3490 Malignant neoplasm of unspecified part of unspecified bronchus or lung: Secondary | ICD-10-CM

## 2019-05-29 DIAGNOSIS — C771 Secondary and unspecified malignant neoplasm of intrathoracic lymph nodes: Secondary | ICD-10-CM | POA: Insufficient documentation

## 2019-05-29 DIAGNOSIS — Z5111 Encounter for antineoplastic chemotherapy: Secondary | ICD-10-CM

## 2019-05-29 DIAGNOSIS — C3431 Malignant neoplasm of lower lobe, right bronchus or lung: Secondary | ICD-10-CM | POA: Insufficient documentation

## 2019-05-29 DIAGNOSIS — C3491 Malignant neoplasm of unspecified part of right bronchus or lung: Secondary | ICD-10-CM

## 2019-05-29 DIAGNOSIS — Z7189 Other specified counseling: Secondary | ICD-10-CM | POA: Insufficient documentation

## 2019-05-29 DIAGNOSIS — R918 Other nonspecific abnormal finding of lung field: Secondary | ICD-10-CM

## 2019-05-29 DIAGNOSIS — I2510 Atherosclerotic heart disease of native coronary artery without angina pectoris: Secondary | ICD-10-CM

## 2019-05-29 LAB — CBC WITH DIFFERENTIAL (CANCER CENTER ONLY)
Abs Immature Granulocytes: 0.03 10*3/uL (ref 0.00–0.07)
Basophils Absolute: 0 10*3/uL (ref 0.0–0.1)
Basophils Relative: 1 %
Eosinophils Absolute: 0.2 10*3/uL (ref 0.0–0.5)
Eosinophils Relative: 2 %
HCT: 40 % (ref 39.0–52.0)
Hemoglobin: 13.3 g/dL (ref 13.0–17.0)
Immature Granulocytes: 0 %
Lymphocytes Relative: 18 %
Lymphs Abs: 1.5 10*3/uL (ref 0.7–4.0)
MCH: 30.8 pg (ref 26.0–34.0)
MCHC: 33.3 g/dL (ref 30.0–36.0)
MCV: 92.6 fL (ref 80.0–100.0)
Monocytes Absolute: 0.6 10*3/uL (ref 0.1–1.0)
Monocytes Relative: 7 %
Neutro Abs: 6 10*3/uL (ref 1.7–7.7)
Neutrophils Relative %: 72 %
Platelet Count: 242 10*3/uL (ref 150–400)
RBC: 4.32 MIL/uL (ref 4.22–5.81)
RDW: 12.1 % (ref 11.5–15.5)
WBC Count: 8.3 10*3/uL (ref 4.0–10.5)
nRBC: 0 % (ref 0.0–0.2)

## 2019-05-29 MED ORDER — PROCHLORPERAZINE MALEATE 10 MG PO TABS: 10.0000 mg | ORAL_TABLET | Freq: Four times a day (QID) | ORAL | 0 refills | Status: DC | PRN

## 2019-05-29 NOTE — Progress Notes (Signed)
Oncology Nurse Navigator Documentation  Oncology Nurse Navigator Flowsheets 05/29/2019  Diagnosis Status Pending Molecular Studies  Phase of Treatment Chemo/Radiation Concurrent  Navigator Follow Up Date: 06/02/2019  Navigator Follow Up Reason: Review Note  Navigator Location [facility]  Navigator Encounter Type Initial MedOnc;Initial RadOnc/spoke with patient today at thoracic clinic.  Patient needed blood test Guardant 360, I help to arrange this and it was completed.  I gave and explained information on lung cancer, treatment, and next steps.    Abnormal Finding Date 04/30/2019  Confirmed Diagnosis Date 05/16/2019  Multidisiplinary Clinic Date 05/29/2019  Treatment Initiated Date 06/02/2019  Patient Visit Type MedOnc  Treatment Phase Pre-Tx/Tx Discussion  Barriers/Navigation Needs Coordination of Care;Education  Education Newly Diagnosed Cancer Education;Other  Interventions Coordination of Care;Education  Coordination of Care Appts;Other  Education Method Verbal;Written  Acuity Level 2-Minimal Needs (1-2 Barriers Identified)  Time Spent with Patient 45

## 2019-05-29 NOTE — Progress Notes (Signed)
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

## 2019-05-29 NOTE — Progress Notes (Signed)
Alan [name] Telephone:([phone]   [phone] Multidisciplinary thoracic oncology clinic  CONSULT NOTE  REFERRING PHYSICIAN: Dr. Marshell Garfinkel  REASON FOR CONSULTATION:  63 years old white male recently diagnosed with lung cancer.  HPI Alan [name] is a 63 y.o. male a never smoker with with past medical history significant for coronary artery disease, dyslipidemia, mitral valve repair.  The patient mentions that few years ago he has some aspiration of melatonin and magnesium that were extracted.  He had a similar episode around 18 months ago.  In early July 2020 the patient presented to his primary care physician complaining of cough and shortness of breasts.  He was treated with a course of antibiotics as well as a steroid with no improvement in his condition.  Chest x-ray at that time showed right middle lobe opacity that did not show any improvement after the treatment.  He had CT scan of the chest on 04/30/2019 and that showed 3.8 cm right infrahilar mass causing prominent narrowing of the right middle lobe bronchus, prominent narrowing of the right lower lobe artery and abutting the bronchus intermedius and the right lower lobe bronchus.  This is associated with atelectasis and some peripheral airspace opacity in both the right middle lobe and right lower lobe.  There was also a small right pleural effusion.  There was mildly abnormal enlargement of prevascular and right paratracheal adenopathy suspicious for malignant involvement.  The patient was referred to Dr. Vaughan Browner and a PET scan was performed on May 15, 2019 and that showed hypermetabolic nodule in the right lower lobe along the fissure suspicious for primary bronchogenic carcinoma.  There was enlarged hypermetabolic right hilar node/nodule favored metastatic bronchogenic carcinoma node versus primary lesion.  There was also mediastinal nodal metastasis to the ipsilateral and contralateral nodal station  but no supraclavicular adenopathy.  No evidence of distant metastatic disease. On May 16, 2019 the patient underwent video bronchoscopy with endobronchial ultrasound under the care of Dr. Vaughan Browner with endobronchial biopsy and Wang needle biopsies from station 7 node as well as 11 R node.  The final cytology ([id number]) from lymph node from station 7 as well as 11-hour showed malignant cells consistent with adenocarcinoma.  There was no tumor in the cellblock for molecular studies. The patient was referred to the multidisciplinary thoracic oncology clinic today for evaluation and recommendation regarding treatment of his condition. When seen today he continues to complain of dry cough as well as shortness of breath with exertion but no significant chest pain or hemoptysis.  He lost only few pounds in the last few weeks.  He denied having any headache or visual changes but has few episodes of vertigo.  He denied having any nausea, vomiting, diarrhea or constipation. Family history significant for mother with history of breast cancer.  Father had heart disease and paternal grandmother had pancreatic cancer. The patient is married and has 1 son.  He is currently retired and used to work in parishes and sub like contraction.  His wife Daine Floras was available by phone during the visit.  The patient has no history for smoking and drinks 1-2 drinks 4-5 times a week.  He has no history of drug abuse.  HPI  Past Medical History:  Diagnosis Date   CAD S/P percutaneous coronary angioplasty 2004   which showed essentially normal LV size and function, moderately dilated left atrium, moderate mitral prolapse with mild to moderate regurgitation.   Dyslipidemia, goal LDL below 70  Essential hypertension    GERD (gastroesophageal reflux disease)    History of Mitral valve prolapse    Moderate - with moderate MR, noted February t 2013   History of Severe mitral regurgitation by prior echocardiogram  02/06/2014   TEE: Severe mitral regurgitation with a flail P2 segment and ruptured; Normal LV size & function, dilated LA.   History of stress test 12/2009   he walked 9 mins reaching 10 METS. There was an attenuation artifact in the inferior region but no ischemia or infaract, low risk.   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    Past Surgical History:  Procedure Laterality Date   ANTERIOR CRUCIATE LIGAMENT REPAIR Left 1993   CARDIAC CATHETERIZATION  2004   he had a proximal LAD occlusion treated with 2 overlapping 3.5 x 1.8 mm Cypher DES stents.   CARDIAC CATHETERIZATION  2007; 03/2014   Widely patent LAD stents, 40% distal LAD, 30% Cx, ~50% PL-PDA bifurcation lesion    COLONOSCOPY     INTRAOPERATIVE TRANSESOPHAGEAL ECHOCARDIOGRAM N/A 04/08/2014   Procedure: INTRAOPERATIVE TRANSESOPHAGEAL ECHOCARDIOGRAM;  Surgeon: Rexene Alberts, MD;  Location: [place];  Service: Open Heart Surgery;  Laterality: N/A;   LEFT AND RIGHT HEART CATHETERIZATION WITH CORONARY ANGIOGRAM N/A 03/05/2014   Procedure: LEFT AND RIGHT HEART CATHETERIZATION WITH CORONARY ANGIOGRAM;  Surgeon: Leonie Man, MD;  Location: [facility] CATH LAB;  Service: Cardiovascular;  Laterality: N/A;   LEFT HEART CATHETERIZATION WITH CORONARY ANGIOGRAM N/A 09/15/2011   Procedure: LEFT HEART CATHETERIZATION WITH CORONARY  ANGIOGRAM;  Surgeon: Lorretta Harp, MD;  Location: [facility] CATH LAB;  Service: Cardiovascular;  Laterality: N/A;   MITRAL VALVE REPAIR Right 04/08/2014   Procedure: MINIMALLY INVASIVE MITRAL VALVE REPAIR (MVR);  Surgeon: Rexene Alberts, MD;  Location: [place];  Service: Open Heart Surgery;  Laterality: Right;   NM MYOVIEW LTD  May 2011   Walk 9 minutes, and 10 METs, diaphragmatic attenuation but no ischemia or infarction.   TEE WITHOUT CARDIOVERSION N/A 02/06/2014   Procedure: TRANSESOPHAGEAL ECHOCARDIOGRAM (TEE);  Surgeon: Pixie Casino, MD;  Normal LV Size U& function - EF 55-60%, no regional WMA.  MV P2 Leaflet is flail with ruptured chord with severe prolapse, anterior leaflet intact.  Severe, eccentric anterior directed MR with dilated LA.   TRANSTHORACIC ECHOCARDIOGRAM  07/18/2018   Normal LV size and function.  EF 50-60 %.  Unable to assess diastolic function.  Mitral valve sewing ring in place.  Mild stenosis with a gradient of 6 mmHg noted. -   TRANSTHORACIC ECHOCARDIOGRAM  June 30 2015   Low normal LV function with EF 50-55%. GR 1 DD. No MR. Normal gradient of 4 mmHg the mitral valve. No prolapse.   VIDEO BRONCHOSCOPY WITH ENDOBRONCHIAL ULTRASOUND N/A 05/16/2019   Procedure: VIDEO BRONCHOSCOPY WITH ENDOBRONCHIAL ULTRASOUND;  Surgeon: Marshell Garfinkel, MD;  Location: [place];  Service: Pulmonary;  Laterality:  N/A;    Family History  Problem Relation Age of Onset   Hypertension Mother    Heart Problems Father        triple bypass 1989   Cancer Paternal Grandmother        Pancreatic    Social History Social History   Tobacco Use   Smoking status: Never Smoker   Smokeless tobacco: Never Used  Substance Use Topics   Alcohol use: Yes    Alcohol/week: 10.0 standard drinks    Types: 5 Cans of beer, 5 Shots of liquor per week    Comment: social   Drug use: No    Allergies  Allergen Reactions   A-Cillin [Ampicillin] Shortness Of Breath, Swelling and Rash    Did it  involve swelling of the face/tongue/throat, SOB, or low BP? Yes Did it involve sudden or severe rash/hives, skin peeling, or any reaction on the inside of your mouth or nose? Yes Did you need to seek medical attention at a hospital or doctor's office? Yes When did it last happen?~20 years ago If all above answers are "NO", may proceed with cephalosporin use.     Amoxicillin Hives, Shortness Of Breath and Swelling    Did it involve swelling of the face/tongue/throat, SOB, or low BP? Yes Did it involve sudden or severe rash/hives, skin peeling, or any reaction on the inside of your mouth or nose? Yes Did you need to seek medical attention at a hospital or doctor's office? Yes When did it last happen?~20 years ago If all above answers are "NO", may proceed with cephalosporin use.    Other Hives, Shortness Of Breath and Swelling    ALL "CILLINS"   Penicillins Hives, Shortness Of Breath and Swelling    Did it involve swelling of the face/tongue/throat, SOB, or low BP? Yes Did it involve sudden or severe rash/hives, skin peeling, or any reaction on the inside of your mouth or nose? Yes Did you need to seek medical attention at a hospital or doctor's office? Yes When did it last happen?~20 years ago If all above answers are "NO", may proceed with cephalosporin use.      Current Outpatient Medications  Medication Sig Dispense Refill   Ascorbic Acid (VITAMIN C) 1000 MG tablet Take 1,000 mg by mouth daily.     Coenzyme Q10 (CO Q 10) 100 MG CAPS Take 100 mg by mouth daily.      Probiotic Product (PROBIOTIC DAILY PO) Take 1 capsule by mouth daily.      Specialty Vitamins Products (PROSTATE PO) Take 1 capsule by mouth daily. PROSTATE FORMULA     Vitamin D-Vitamin K (D3 + K2 DOTS PO) Take 1 tablet by mouth daily.     zolpidem (AMBIEN) 5 MG tablet TAKE 1 TABLET BY MOUTH EVERY NIGHT AT BEDTIME AS NEEDED FOR SLEEP (Patient taking differently: Take 5 mg by mouth at bedtime as needed for  sleep. ) 90 tablet 2   No current facility-administered medications for this visit.     Review of Systems  Constitutional: negative Eyes: negative Ears, nose, mouth, throat, and face: negative Respiratory: positive for cough and dyspnea on exertion Cardiovascular: negative Gastrointestinal: negative Genitourinary:negative Integument/breast: negative Hematologic/lymphatic: negative Musculoskeletal:negative Neurological: negative Behavioral/Psych: negative Endocrine: negative Allergic/Immunologic: negative  Physical Exam  [id number], healthy, no distress, well nourished and well developed SKIN: skin color, texture, turgor are normal, no rashes or significant lesions HEAD: Normocephalic, No masses, lesions, tenderness or abnormalities EYES: normal, PERRLA, Conjunctiva are pink and non-injected EARS:  External ears normal, Canals clear OROPHARYNX:no exudate, no erythema and lips, buccal mucosa, and tongue normal  NECK: supple, no adenopathy, no JVD LYMPH:  no palpable lymphadenopathy, no hepatosplenomegaly LUNGS: clear to auscultation , and palpation HEART: regular rate & rhythm, no murmurs and no gallops ABDOMEN:abdomen soft, non-tender, normal bowel sounds and no masses or organomegaly BACK: No CVA tenderness, Range of motion is normal EXTREMITIES:no joint deformities, effusion, or inflammation, no edema  NEURO: alert & oriented x 3 with fluent speech, no focal motor/sensory deficits  PERFORMANCE STATUS: ECOG 1  LABORATORY DATA: Lab Results  Component Value Date   WBC 8.3 05/29/2019   HGB 13.3 05/29/2019   HCT 40.0 05/29/2019   MCV 92.6 05/29/2019   PLT 242 05/29/2019      Chemistry      Component Value Date/Time   NA 140 05/16/2019 1116   K 3.6 05/16/2019 1116   CL 105 05/16/2019 1116   CO2 24 05/16/2019 1116   BUN 12 05/16/2019 1116   CREATININE 0.66 05/16/2019 1116   CREATININE 0.80 02/27/2014 1152      Component Value Date/Time   CALCIUM 9.4 05/16/2019  1116   ALKPHOS 70 04/06/2014 1319   AST 29 04/06/2014 1319   ALT 23 04/06/2014 1319   BILITOT 1.1 04/06/2014 1319       RADIOGRAPHIC STUDIES: Ct Chest W Contrast  Result Date: 04/30/2019 CLINICAL DATA:  Right middle lobe opacity for further assessment. EXAM: CT CHEST WITH CONTRAST TECHNIQUE: Multidetector CT imaging of the chest was performed during intravenous contrast administration. CONTRAST:  39mL ISOVUE-300 IOPAMIDOL (ISOVUE-300) INJECTION 61% COMPARISON:  04/18/2019 and chest CT from 06/07/2015 FINDINGS: Cardiovascular: Coronary, aortic arch, and branch vessel atherosclerotic vascular disease. Prosthetic mitral valve. Prominent narrowing of the right lower pulmonary arterial branch due to surrounding tumor. Mediastinum/Nodes: Prevascular lymph node 1.2 cm in short axis on image 56/2. Lower right paratracheal node 1.2 cm in short axis, image 75/2 (previously 0.3 cm in short axis). Scattered smaller subcarinal, AP window, paratracheal, and right supraclavicular nodes. Lungs/Pleura: A 3.7 by 3.5 by 3.8 cm right infrahilar mass causes marked narrowing of the right lower pulmonary arterial branch as well as prominent narrowing of the right middle lobe bronchus. The mass abuts the bronchus intermedius and right lower lobe bronchus as well. There is volume loss and atelectasis in the right middle lobe with some slightly nodular components especially along the periphery, as well as mild atelectasis in the right lower lobe with some peripheral nodularity and peripheral airspace opacity in the right lower lobe near the costophrenic angle. A small right pleural effusion is present. Strictly speaking in some of the marginal areas of airspace opacity, tumor nodularity is difficult to totally exclude. In the right lower lobe on image 105/3 a 0.4 cm pulmonary nodule is present. No significant findings in the left lung. Upper Abdomen: Unremarkable.  Adrenal glands normal. Musculoskeletal: Small expansile lucent  lesion in the left posterolateral tenth rib on image 146/3, unchanged from 06/07/2015, likely a small benign lesions such as an enchondroma. An otherwise nonspecific 9 mm in diameter lesion eccentric to the left in the T3 vertebral body is unchanged from 2016 hence likely benign. IMPRESSION: 1. 3.8 cm right infrahilar mass causing prominent narrowing of the right middle lobe bronchus, prominent narrowing of the right lower pulmonary artery, and abutting the bronchus intermedius and right lower lobe bronchus. This is associated with atelectasis and some peripheral airspace opacity in both the right middle lobe and right lower lobe.  There is also a small right pleural effusion. Strictly speaking it is difficult to exclude the possibility of additional tumor in the peripheral regions of airspace opacity. 2. Mildly abnormally enlarged prevascular and right paratracheal adenopathy, suspicious for malignant involvement. 3. Other imaging findings of potential clinical significance: Aortic Atherosclerosis (ICD10-I70.0). Coronary atherosclerosis. Prosthetic mitral valve. Benign-appearing lucent lesion in the left tenth rib, no change from 2016. Likely benign sclerotic lesion in the T3 vertebral body, not significantly changed from 2016. Electronically Signed   By: Van Clines M.D.   On: 04/30/2019 17:34   Nm Pet Image Initial (pi) Skull Base To Thigh  Result Date: 05/15/2019 CLINICAL DATA:  Initial treatment strategy for lung mass. EXAM: NUCLEAR MEDICINE PET SKULL BASE TO THIGH TECHNIQUE: 9.8 mCi F-18 FDG was injected intravenously. Full-ring PET imaging was performed from the skull base to thigh after the radiotracer. CT data was obtained and used for attenuation correction and anatomic localization. Fasting blood glucose: 101 mg/dl COMPARISON:  None. FINDINGS: Mediastinal blood pool activity: SUV max 2.2 Liver activity: SUV max NA NECK: No hypermetabolic lymph nodes in the neck. No hypermetabolic supraclavicular  nodes. Incidental CT findings: none CHEST: Hypermetabolic high RIGHT paratracheal lymph node measures 9 mm (image 57/4 with SUV max equal 4.2. Hypermetabolic prevascular and RIGHT lower paratracheal lymph nodes. Example prevascular node anterior to the ascending aorta measuring 10 mm SUV max equal 7.8. Hypermetabolic contralateral RIGHT lower paratracheal node measuring only 8 mm SUV max equal 4.0. Hypermetabolic RIGHT paratracheal nodes with SUV max equal 5.0. Large intensely hypermetabolic RIGHT hilar lymph node measuring approximately 1.7 cm SUV max equal 9.2. Incidental CT findings: none ABDOMEN/PELVIS: No abnormal hypermetabolic activity within the liver, pancreas, adrenal glands, or spleen. No hypermetabolic lymph nodes in the abdomen or pelvis. Incidental CT findings: Prostate normal SKELETON: No focal hypermetabolic activity to suggest skeletal metastasis. Incidental CT findings: none IMPRESSION: 1. Hypermetabolic nodule in the in the RIGHT lower lobe along the fissure may represent primary bronchogenic carcinoma. 2. Enlarged hypermetabolic RIGHT hilar node/nodule is favored metastatic bronchogenic carcinoma node versus primary lesion. 3. 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 No supraclavicular adenopathy. 4. No evidence distant metastatic disease. 5. Peripheral consolidation in the RIGHT lower lobe and thickening along the fissures is favored chronic inflammatory or infectious process. If multidisciplinary follow up management is desired, this is available in the [place] through the Multidisciplinary Thoracic Clinic [phone]. These results will be called to the ordering clinician or representative by the Radiologist Assistant, and communication documented in the PACS or zVision Dashboard. Electronically Signed   By: Suzy Bouchard M.D.   On: 05/15/2019 09:15   Dg Chest Port 1 View  Result Date: 05/16/2019 CLINICAL DATA:  Status post  bronchoscopy EXAM: PORTABLE CHEST 1 VIEW COMPARISON:  04/18/2019 FINDINGS: Bilateral mild interstitial thickening with linear bands of airspace disease at the lung bases likely reflecting atelectasis. Persistent hazy right lower lobe airspace disease. Small right pleural effusion. No pneumothorax. Normal heart size. Right hilar mass not well delineated on the x-ray and better visualized on recent chest CT dated 04/30/2019. No aggressive osseous lesion. IMPRESSION: 1. Small right pleural effusion bibasilar atelectasis. Hazy right lower lobe airspace disease unchanged from the prior examination. Electronically Signed   By: Kathreen Devoid   On: 05/16/2019 16:26    ASSESSMENT: This is a very pleasant 63 years old white male with recently diagnosed at least stage IIIb (T2b, N3, M0) non-small cell lung cancer, adenocarcinoma presented with  right lower lobe lung nodule in addition to right hilar mass/node as well as ipsilateral and contralateral mediastinal lymphadenopathy diagnosed in October 2020. There was insufficient tissue material for molecular studies.   PLAN: I had a lengthy discussion with the patient and his wife today about his current disease stage, prognosis and treatment options. I recommended for the patient to complete the staging work-up by ordering MRI of the brain to rule out brain metastasis. I also recommended for the patient to have blood test sent to [facility] for molecular studies. I had a lengthy discussion with the patient and his wife today about his current disease stage, prognosis and treatment options. I recommended for the patient treatment with concurrent chemoradiation with weekly carboplatin for AUC of 2 and paclitaxel 45 mg/M2 for 6-7 weeks.  This will be followed by treatment with immunotherapy with Imfinzi if the patient has no disease progression after the induction concurrent chemoradiation. I discussed with the patient and his wife the adverse effect of this treatment  including but not limited to alopecia, myelosuppression, nausea and vomiting, peripheral neuropathy, liver or renal dysfunction. I will arrange for the patient to have a chemotherapy education class before the first dose of his treatment. He will be seen later today by Dr. Sondra Come from radiation oncology for evaluation and discussion of the radiotherapy portion of his treatment. If the patient has any evidence of metastatic disease to the brain, we may change the treatment plan accordingly. He will come back for follow-up visit in 1 week after the treatment for evaluation and management of any adverse effect of his chemotherapy. I will call his pharmacy with prescription for Compazine 10 mg p.o. every 6 hours as needed for nausea. The patient was advised to call immediately if he has any concerning symptoms in the interval. The patient voices understanding of current disease status and treatment options and is in agreement with the current care plan.  All questions were answered. The patient knows to call the clinic with any problems, questions or concerns. We can certainly see the patient much sooner if necessary.  Thank you so much for allowing me to participate in the care of Alan [name]. I will continue to follow up the patient with you and assist in his care.  I spent 55 minutes counseling the patient face to face. The total time spent in the appointment was 80 minutes.  Disclaimer: This note was dictated with voice recognition software. Similar sounding words can inadvertently be transcribed and may not be corrected upon review.   Eilleen Kempf May 29, 2019, 1:44 PM

## 2019-05-29 NOTE — Telephone Encounter (Signed)
Scheduled appt per 10/15 los - gave patient AVS and calender -   No patient edu scheduled because they are booked next week before chemo treatment. Message sent to chemo edu RN to see if patient can be worked in before chemo

## 2019-05-29 NOTE — Progress Notes (Signed)
Radiation Oncology         [phone] ________________________________  Multidisciplinary Thoracic Oncology Clinic [place] [facility]) Initial Outpatient Consultation  Name: Alan [name] MRN: [id number]  Date: 05/29/2019  DOB: [birth year]  CC:Mitchell, L.[name], MD  Marshell Garfinkel, MD   REFERRING PHYSICIAN: Marshell Garfinkel, MD  DIAGNOSIS: Stage IIIb (T2b, N3, M0) non-small cell lung cancer, adenocarcinoma presented with right lower lobe lung nodule in addition to right hilar mass/node as well as ipsilateral and contralateral mediastinal lymphadenopathy diagnosed in October 2020.    ICD-10-CM   1. Adenocarcinoma of right lung, stage 3 (HCC)  C34.91     HISTORY OF PRESENT ILLNESS::Alan [name] is a 63 y.o. male who presented to his PCP in 02/2019 for cough and dyspnea. Chest x-ray performed at that time showed a right middle lobe opacity. He was treated with doxycycline and prednisone. His symptoms persisted, and he underwent repeat chest x-ray on 04/18/2019. This showed: persistent and slightly worsened patchy density in the right middle lobe. This prompted a chest CT, which was performed on 04/30/2019 and revealed: 3.8 cm right infrahilar mass causing prominent narrowing of the RML bronchus and right lower pulmonary artery, and abutting the bronchus intermedius and RLL bronchus; mass is associated with atelectasis and some peripheral airspace opacity in the the RML and RLL; mildly enlarged prevascular and right paratracheal adenopathy.  He was referred to Dr. Vaughan Browner on 05/08/2019, who ordered PET scan. Performed on 05/15/2019, PET scan showed: hypermetabolic nodule in the RLL along the fissure; enlarged hypermetabolic right hilar node/nodule; mediastinal nodal metastasis to the ipsilateral and contralateral nodal stations; no supraclavicular adenopathy; no evidence of distant metastatic disease.  He proceeded to bronchoscopy with lymph node biopsies on 05/16/2019. Cytology from the procedure showed  malignant cells consistent with adenocarcinoma in both biopsied lymph nodes.  The patient was referred today for presentation in the multidisciplinary conference.  Radiology studies and pathology slides were presented there for review and discussion of treatment options.  A consensus was discussed regarding potential next steps.  PREVIOUS RADIATION THERAPY: No  PAST MEDICAL HISTORY:  has a past medical history of CAD S/P percutaneous coronary angioplasty (2004), Dyslipidemia, goal LDL below 70, Essential hypertension, GERD (gastroesophageal reflux disease), History of Mitral valve prolapse, History of Severe mitral regurgitation by prior echocardiogram (02/06/2014), History of stress test (12/2009), Incidental lung nodule, greater than or equal to 75mm (03/16/2014), PONV (postoperative nausea and vomiting), S/P Minimally Invasive MVR (mitral valve repair) (04/08/2014), and ST elevation myocardial infarction (STEMI) of anterior wall, subsequent episode of care [facility]) (2004).    PAST SURGICAL HISTORY: Past Surgical History:  Procedure Laterality Date   ANTERIOR CRUCIATE LIGAMENT REPAIR Left 1993   CARDIAC CATHETERIZATION  2004   he had a proximal LAD occlusion treated with 2 overlapping 3.5 x 1.8 mm Cypher DES stents.   CARDIAC CATHETERIZATION  2007; 03/2014   Widely patent LAD stents, 40% distal LAD, 30% Cx, ~50% PL-PDA bifurcation lesion    COLONOSCOPY     INTRAOPERATIVE TRANSESOPHAGEAL ECHOCARDIOGRAM N/A 04/08/2014   Procedure: INTRAOPERATIVE TRANSESOPHAGEAL ECHOCARDIOGRAM;  Surgeon: Rexene Alberts, MD;  Location: [place];  Service: Open Heart Surgery;  Laterality: N/A;   LEFT AND RIGHT HEART CATHETERIZATION WITH CORONARY ANGIOGRAM N/A 03/05/2014   Procedure: LEFT AND RIGHT HEART CATHETERIZATION WITH CORONARY ANGIOGRAM;  Surgeon: Leonie Man, MD;  Location: [facility] CATH LAB;  Service: Cardiovascular;  Laterality: N/A;   LEFT HEART CATHETERIZATION WITH CORONARY ANGIOGRAM N/A 09/15/2011   Procedure:  LEFT HEART CATHETERIZATION WITH  CORONARY ANGIOGRAM;  Surgeon: Lorretta Harp, MD;  Location: [facility] CATH LAB;  Service: Cardiovascular;  Laterality: N/A;   MITRAL VALVE REPAIR Right 04/08/2014   Procedure: MINIMALLY INVASIVE MITRAL VALVE REPAIR (MVR);  Surgeon: Rexene Alberts, MD;  Location: [place];  Service: Open Heart Surgery;  Laterality: Right;   NM MYOVIEW LTD  May 2011   Walk 9 minutes, and 10 METs, diaphragmatic attenuation but no ischemia or infarction.   TEE WITHOUT CARDIOVERSION N/A 02/06/2014   Procedure: TRANSESOPHAGEAL ECHOCARDIOGRAM (TEE);  Surgeon: Pixie Casino, MD;  Normal LV Size U& function - EF 55-60%, no regional WMA.  MV P2 Leaflet is flail with ruptured chord with severe prolapse, anterior leaflet intact.  Severe, eccentric anterior directed MR with dilated LA.   TRANSTHORACIC ECHOCARDIOGRAM  07/18/2018   Normal LV size and function.  EF 50-60 %.  Unable to assess diastolic function.  Mitral valve sewing ring in place.  Mild stenosis with a gradient of 6 mmHg noted. -   TRANSTHORACIC ECHOCARDIOGRAM  June 30 2015   Low normal LV function with EF 50-55%. GR 1 DD. No MR. Normal gradient of 4 mmHg the mitral valve. No prolapse.   VIDEO BRONCHOSCOPY WITH ENDOBRONCHIAL ULTRASOUND N/A 05/16/2019   Procedure: VIDEO BRONCHOSCOPY WITH ENDOBRONCHIAL ULTRASOUND;  Surgeon: Marshell Garfinkel, MD;  Location: [place];  Service: Pulmonary;  Laterality: N/A;    FAMILY HISTORY: family history includes Cancer in his paternal grandmother; Heart Problems in his father; Hypertension in his mother.  SOCIAL HISTORY:  reports that he has never smoked. He has never used smokeless tobacco. He reports current alcohol use of about 10.0 standard drinks of alcohol per week. He reports that he does not use drugs.  ALLERGIES: A-cillin [ampicillin], Amoxicillin, Other, and Penicillins  MEDICATIONS:  Current Outpatient Medications  Medication Sig Dispense Refill   Ascorbic Acid (VITAMIN C) 1000 MG  tablet Take 1,000 mg by mouth daily.     Coenzyme Q10 (CO Q 10) 100 MG CAPS Take 100 mg by mouth daily.      Probiotic Product (PROBIOTIC DAILY PO) Take 1 capsule by mouth daily.      prochlorperazine (COMPAZINE) 10 MG tablet Take 1 tablet (10 mg total) by mouth every 6 (six) hours as needed for nausea or vomiting. 30 tablet 0   Specialty Vitamins Products (PROSTATE PO) Take 1 capsule by mouth daily. PROSTATE FORMULA     Vitamin D-Vitamin K (D3 + K2 DOTS PO) Take 1 tablet by mouth daily.     zolpidem (AMBIEN) 5 MG tablet TAKE 1 TABLET BY MOUTH EVERY NIGHT AT BEDTIME AS NEEDED FOR SLEEP (Patient taking differently: Take 5 mg by mouth at bedtime as needed for sleep. ) 90 tablet 2   No current facility-administered medications for this encounter.     REVIEW OF SYSTEMS:  REVIEW OF SYSTEMS: A 10+ POINT REVIEW OF SYSTEMS WAS OBTAINED including neurology, dermatology, psychiatry, cardiac, respiratory, lymph, extremities, GI, GU, musculoskeletal, constitutional, reproductive, HEENT. All pertinent positives are noted in the HPI. All others are negative.  Patient reports approximately 10 pound weight loss over the past month.  Continues to have good appetite.  He has had some difficulties with sleeping related to anxiety and diagnosis.  Patient denies any hemoptysis or  pain within the chest area.  He has noted some mild dyspnea but continues to exercise on a regular basis without any significant difficulty.  He denies any headaches or visual difficulties or confusion.  Denies any new bony  pain.   PHYSICAL EXAM:  weight is 184 lb 3.2 oz (83.6 kg). His temperature is 98.7 F (37.1 C). His blood pressure is 130/85 and his pulse is 85. His respiration is 17 and oxygen saturation is 98%.   General: Alert and oriented, in no acute distress, accompanied by his wife through FaceTime, overall healthy-appearing HEENT: Head is normocephalic. Extraocular movements are intact. Oropharynx is clear. Neck: Neck is  supple, no palpable cervical or supraclavicular lymphadenopathy. Heart: Regular in rate and rhythm with no murmurs, rubs, or gallops. Chest: Clear to auscultation bilaterally, with no rhonchi, wheezes, or rales. Abdomen: Soft, nontender, nondistended, with no rigidity or guarding. Extremities: No cyanosis or edema. Lymphatics: see Neck Exam Skin: No concerning lesions. Musculoskeletal: symmetric strength and muscle tone throughout. Neurologic: Cranial nerves II through XII are grossly intact. No obvious focalities. Speech is fluent. Coordination is intact. Psychiatric: Judgment and insight are intact. Affect is appropriate.   KPS = 90  100 - Normal; no complaints; no evidence of disease. 90   - Able to carry on normal activity; minor signs or symptoms of disease. 80   - Normal activity with effort; some signs or symptoms of disease. 53   - Cares for self; unable to carry on normal activity or to do active work. 60   - Requires occasional assistance, but is able to care for most of his personal needs. 50   - Requires considerable assistance and frequent medical care. 43   - Disabled; requires special care and assistance. 61   - Severely disabled; hospital admission is indicated although death not imminent. 65   - Very sick; hospital admission necessary; active supportive treatment necessary. 10   - Moribund; fatal processes progressing rapidly. 0     - Dead  Karnofsky DA, Abelmann [place], Craver LS and Burchenal [facility] [phone]) The use of the nitrogen mustards in the palliative treatment of carcinoma: with particular reference to bronchogenic carcinoma Cancer 1 634-56  LABORATORY DATA:  Lab Results  Component Value Date   WBC 8.3 05/29/2019   HGB 13.3 05/29/2019   HCT 40.0 05/29/2019   MCV 92.6 05/29/2019   PLT 242 05/29/2019   Lab Results  Component Value Date   NA 140 05/16/2019   K 3.6 05/16/2019   CL 105 05/16/2019   CO2 24 05/16/2019   Lab Results  Component Value Date   ALT  23 04/06/2014   AST 29 04/06/2014   ALKPHOS 70 04/06/2014   BILITOT 1.1 04/06/2014    PULMONARY FUNCTION TEST:   Recent Review Flowsheet Data    Spirometry Latest Ref Rng & Units 04/06/2014   FVC-%PRED-PRE % 90   FEV1-PRE L 3.70   FEV1-%PRED-PRE % 82   DLCO [facility] ml/min/mmHg 42.68      RADIOGRAPHY: Ct Chest W Contrast  Result Date: 04/30/2019 CLINICAL DATA:  Right middle lobe opacity for further assessment. EXAM: CT CHEST WITH CONTRAST TECHNIQUE: Multidetector CT imaging of the chest was performed during intravenous contrast administration. CONTRAST:  39mL ISOVUE-300 IOPAMIDOL (ISOVUE-300) INJECTION 61% COMPARISON:  04/18/2019 and chest CT from 06/07/2015 FINDINGS: Cardiovascular: Coronary, aortic arch, and branch vessel atherosclerotic vascular disease. Prosthetic mitral valve. Prominent narrowing of the right lower pulmonary arterial branch due to surrounding tumor. Mediastinum/Nodes: Prevascular lymph node 1.2 cm in short axis on image 56/2. Lower right paratracheal node 1.2 cm in short axis, image 75/2 (previously 0.3 cm in short axis). Scattered smaller subcarinal, AP window, paratracheal, and right supraclavicular nodes. Lungs/Pleura: A 3.7 by 3.5  by 3.8 cm right infrahilar mass causes marked narrowing of the right lower pulmonary arterial branch as well as prominent narrowing of the right middle lobe bronchus. The mass abuts the bronchus intermedius and right lower lobe bronchus as well. There is volume loss and atelectasis in the right middle lobe with some slightly nodular components especially along the periphery, as well as mild atelectasis in the right lower lobe with some peripheral nodularity and peripheral airspace opacity in the right lower lobe near the costophrenic angle. A small right pleural effusion is present. Strictly speaking in some of the marginal areas of airspace opacity, tumor nodularity is difficult to totally exclude. In the right lower lobe on image 105/3 a 0.4 cm  pulmonary nodule is present. No significant findings in the left lung. Upper Abdomen: Unremarkable.  Adrenal glands normal. Musculoskeletal: Small expansile lucent lesion in the left posterolateral tenth rib on image 146/3, unchanged from 06/07/2015, likely a small benign lesions such as an enchondroma. An otherwise nonspecific 9 mm in diameter lesion eccentric to the left in the T3 vertebral body is unchanged from 2016 hence likely benign. IMPRESSION: 1. 3.8 cm right infrahilar mass causing prominent narrowing of the right middle lobe bronchus, prominent narrowing of the right lower pulmonary artery, and abutting the bronchus intermedius and right lower lobe bronchus. This is associated with atelectasis and some peripheral airspace opacity in both the right middle lobe and right lower lobe. There is also a small right pleural effusion. Strictly speaking it is difficult to exclude the possibility of additional tumor in the peripheral regions of airspace opacity. 2. Mildly abnormally enlarged prevascular and right paratracheal adenopathy, suspicious for malignant involvement. 3. Other imaging findings of potential clinical significance: Aortic Atherosclerosis (ICD10-I70.0). Coronary atherosclerosis. Prosthetic mitral valve. Benign-appearing lucent lesion in the left tenth rib, no change from 2016. Likely benign sclerotic lesion in the T3 vertebral body, not significantly changed from 2016. Electronically Signed   By: Van Clines M.D.   On: 04/30/2019 17:34   Nm Pet Image Initial (pi) Skull Base To Thigh  Result Date: 05/15/2019 CLINICAL DATA:  Initial treatment strategy for lung mass. EXAM: NUCLEAR MEDICINE PET SKULL BASE TO THIGH TECHNIQUE: 9.8 mCi F-18 FDG was injected intravenously. Full-ring PET imaging was performed from the skull base to thigh after the radiotracer. CT data was obtained and used for attenuation correction and anatomic localization. Fasting blood glucose: 101 mg/dl COMPARISON:  None.  FINDINGS: Mediastinal blood pool activity: SUV max 2.2 Liver activity: SUV max NA NECK: No hypermetabolic lymph nodes in the neck. No hypermetabolic supraclavicular nodes. Incidental CT findings: none CHEST: Hypermetabolic high RIGHT paratracheal lymph node measures 9 mm (image 57/4 with SUV max equal 4.2. Hypermetabolic prevascular and RIGHT lower paratracheal lymph nodes. Example prevascular node anterior to the ascending aorta measuring 10 mm SUV max equal 7.8. Hypermetabolic contralateral RIGHT lower paratracheal node measuring only 8 mm SUV max equal 4.0. Hypermetabolic RIGHT paratracheal nodes with SUV max equal 5.0. Large intensely hypermetabolic RIGHT hilar lymph node measuring approximately 1.7 cm SUV max equal 9.2. Incidental CT findings: none ABDOMEN/PELVIS: No abnormal hypermetabolic activity within the liver, pancreas, adrenal glands, or spleen. No hypermetabolic lymph nodes in the abdomen or pelvis. Incidental CT findings: Prostate normal SKELETON: No focal hypermetabolic activity to suggest skeletal metastasis. Incidental CT findings: none IMPRESSION: 1. Hypermetabolic nodule in the in the RIGHT lower lobe along the fissure may represent primary bronchogenic carcinoma. 2. Enlarged hypermetabolic RIGHT hilar node/nodule is favored metastatic bronchogenic carcinoma node versus  primary lesion. 3. 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 No supraclavicular adenopathy. 4. No evidence distant metastatic disease. 5. Peripheral consolidation in the RIGHT lower lobe and thickening along the fissures is favored chronic inflammatory or infectious process. If multidisciplinary follow up management is desired, this is available in the [facility] through the Multidisciplinary Thoracic Clinic [phone]. These results will be called to the ordering clinician or representative by the Radiologist Assistant, and communication documented in the PACS or zVision  Dashboard. Electronically Signed   By: Suzy Bouchard M.D.   On: 05/15/2019 09:15   Dg Chest Port 1 View  Result Date: 05/16/2019 CLINICAL DATA:  Status post bronchoscopy EXAM: PORTABLE CHEST 1 VIEW COMPARISON:  04/18/2019 FINDINGS: Bilateral mild interstitial thickening with linear bands of airspace disease at the lung bases likely reflecting atelectasis. Persistent hazy right lower lobe airspace disease. Small right pleural effusion. No pneumothorax. Normal heart size. Right hilar mass not well delineated on the x-ray and better visualized on recent chest CT dated 04/30/2019. No aggressive osseous lesion. IMPRESSION: 1. Small right pleural effusion bibasilar atelectasis. Hazy right lower lobe airspace disease unchanged from the prior examination. Electronically Signed   By: Kathreen Devoid   On: 05/16/2019 16:26      IMPRESSION:  Stage IIIb (T2b, N3, M0) non-small cell lung cancer, adenocarcinoma.  Patient would be a good candidate for a definitive course of radiation therapy along with radiosensitizing chemotherapy. He will proceed with a brain MRI next week to complete his staging work-up.  I discussed the overall course of treatment side effects and potential long-term toxicities of radiation therapy in this situation with the patient.  He appears to understand and wishes to proceed with planned course of treatment.  Radiosensitizing chemotherapy will consist of carboplatin and paclitaxel on a weekly basis.  This will likely then be followed by immunotherapy with Imfinzi.  PLAN: CT simulation on October 19 with treatments to begin October 26 concomitant with radiosensitizing chemotherapy.  Treatment plans will change if brain MRI shows metastasis.  Anticipate 6 weeks of radiation therapy directed at any chest.   ------------------------------------------------  Blair Promise, PhD, MD  This document serves as a record of services personally performed by Gery Pray, MD. It was created on his  behalf by Wilburn Mylar, a trained medical scribe. The creation of this record is based on the scribe's personal observations and the provider's statements to them. This document has been checked and approved by the attending provider.

## 2019-05-29 NOTE — Progress Notes (Signed)
The proposed treatment discussed in cancer conference 05/29/19 is for discussion purpose only and not a binding recommendation.  The patient was not physically examined nor present for their treatment options.  Therefore, final treatment plans cannot be decided.

## 2019-06-02 ENCOUNTER — Inpatient Hospital Stay: Payer: Federal, State, Local not specified - PPO

## 2019-06-02 ENCOUNTER — Ambulatory Visit
Admission: RE | Admit: 2019-06-02 | Discharge: 2019-06-02 | Disposition: A | Discharge status: Home / Self Care | Payer: Federal, State, Local not specified - PPO | Source: Ambulatory Visit | Attending: Radiation Oncology | Admitting: Radiation Oncology

## 2019-06-02 ENCOUNTER — Other Ambulatory Visit: Payer: Self-pay

## 2019-06-02 DIAGNOSIS — C3491 Malignant neoplasm of unspecified part of right bronchus or lung: Secondary | ICD-10-CM | POA: Diagnosis present

## 2019-06-02 DIAGNOSIS — Z510 Encounter for antineoplastic radiation therapy: Secondary | ICD-10-CM | POA: Diagnosis not present

## 2019-06-02 NOTE — Progress Notes (Signed)
  Radiation Oncology         [phone] ________________________________  Name: Alan [name] MRN: [id number]  Date: 06/02/2019  DOB: 1956/06/05  SIMULATION AND TREATMENT PLANNING NOTE    ICD-10-CM   1. Adenocarcinoma of right lung, stage 3 (HCC)  C34.91     DIAGNOSIS:  Stage IIIb (T2b, N3, M0) non-small cell lung cancer, adenocarcinoma presented with right lower lobe lung nodule in addition to right hilar mass/node as well as ipsilateral and contralateral mediastinal lymphadenopathy diagnosed in October 2020.  NARRATIVE:  The patient was brought to the [place].  Identity was confirmed.  All relevant records and images related to the planned course of therapy were reviewed.  The patient freely provided informed written consent to proceed with treatment after reviewing the details related to the planned course of therapy. The consent form was witnessed and verified by the simulation staff.  Then, the patient was set-up in a stable reproducible  supine position for radiation therapy.  CT images were obtained.  Surface markings were placed.  The CT images were loaded into the planning software.  Then the target and avoidance structures were contoured.  Treatment planning then occurred.  The radiation prescription was entered and confirmed.  Then, I designed and supervised the construction of a total of 6 medically necessary complex treatment devices.  I have requested : 3D Simulation  I have requested a DVH of the following structures: GTV, PTV,, heart, esophagus, lungs, spinal cord.  I have ordered:dose calc.  PLAN:  The patient will receive 60 Gy in 30 fractions along with radiosensitizing chemotherapy.   Special Treatment Procedure Note: The patient will be receiving radiosensitizing chemotherapy. Given the potential of increased toxicities related to combined therapy and the necessity for close monitoring of the patient and blood work, this constitutes a special treatment  procedure.  -----------------------------------  Blair Promise, PhD, MD  This document serves as a record of services personally performed by Gery Pray, MD. It was created on his behalf by Wilburn Mylar, a trained medical scribe. The creation of this record is based on the scribe's personal observations and the provider's statements to them. This document has been checked and approved by the attending provider.

## 2019-06-04 ENCOUNTER — Ambulatory Visit (HOSPITAL_COMMUNITY)
Admission: RE | Admit: 2019-06-04 | Discharge: 2019-06-04 | Disposition: A | Discharge status: Home / Self Care | Payer: Federal, State, Local not specified - PPO | Source: Ambulatory Visit | Attending: Internal Medicine | Admitting: Internal Medicine

## 2019-06-04 ENCOUNTER — Other Ambulatory Visit: Payer: Self-pay

## 2019-06-04 DIAGNOSIS — C3490 Malignant neoplasm of unspecified part of unspecified bronchus or lung: Secondary | ICD-10-CM | POA: Diagnosis not present

## 2019-06-04 DIAGNOSIS — Z510 Encounter for antineoplastic radiation therapy: Secondary | ICD-10-CM | POA: Diagnosis not present

## 2019-06-04 LAB — POCT I-STAT CREATININE: Creatinine, Ser: 1.3 mg/dL — ABNORMAL HIGH (ref 0.61–1.24)

## 2019-06-04 MED ORDER — GADOBUTROL 1 MMOL/ML IV SOLN
8.0000 mL | Freq: Once | INTRAVENOUS | Status: AC | PRN
  Administered 2019-06-04: 15:00:00 8 mL via INTRAVENOUS

## 2019-06-09 ENCOUNTER — Inpatient Hospital Stay: Payer: Federal, State, Local not specified - PPO

## 2019-06-09 ENCOUNTER — Ambulatory Visit
Admission: RE | Admit: 2019-06-09 | Discharge: 2019-06-09 | Disposition: A | Discharge status: Home / Self Care | Payer: Federal, State, Local not specified - PPO | Source: Ambulatory Visit | Attending: Radiation Oncology | Admitting: Radiation Oncology

## 2019-06-09 ENCOUNTER — Other Ambulatory Visit: Payer: Self-pay

## 2019-06-09 VITALS — BP 123/83 | HR 79 | Temp 98.4°F | Resp 12 | Wt 184.8 lb

## 2019-06-09 DIAGNOSIS — C3491 Malignant neoplasm of unspecified part of right bronchus or lung: Secondary | ICD-10-CM

## 2019-06-09 DIAGNOSIS — Z510 Encounter for antineoplastic radiation therapy: Secondary | ICD-10-CM | POA: Diagnosis not present

## 2019-06-09 DIAGNOSIS — C3431 Malignant neoplasm of lower lobe, right bronchus or lung: Secondary | ICD-10-CM | POA: Diagnosis not present

## 2019-06-09 LAB — CMP (CANCER CENTER ONLY)
ALT: 15 U/L (ref 0–44)
AST: 17 U/L (ref 15–41)
Albumin: 4.1 g/dL (ref 3.5–5.0)
Alkaline Phosphatase: 87 U/L (ref 38–126)
Anion gap: 11 (ref 5–15)
BUN: 13 mg/dL (ref 8–23)
CO2: 22 mmol/L (ref 22–32)
Calcium: 9.7 mg/dL (ref 8.9–10.3)
Chloride: 107 mmol/L (ref 98–111)
Creatinine: 0.77 mg/dL (ref 0.61–1.24)
GFR, Est AFR Am: >60 mL/min (ref >60)
GFR, Estimated: >60 mL/min (ref >60)
Glucose, Bld: 92 mg/dL (ref 70–99)
Potassium: 4 mmol/L (ref 3.5–5.1)
Sodium: 140 mmol/L (ref 135–145)
Total Bilirubin: 0.8 mg/dL (ref 0.3–1.2)
Total Protein: 7 g/dL (ref 6.5–8.1)

## 2019-06-09 LAB — CBC WITH DIFFERENTIAL (CANCER CENTER ONLY)
Abs Immature Granulocytes: 0.02 10*3/uL (ref 0.00–0.07)
Basophils Absolute: 0 10*3/uL (ref 0.0–0.1)
Basophils Relative: 0 %
Eosinophils Absolute: 0.2 10*3/uL (ref 0.0–0.5)
Eosinophils Relative: 3 %
HCT: 41.1 % (ref 39.0–52.0)
Hemoglobin: 13.7 g/dL (ref 13.0–17.0)
Immature Granulocytes: 0 %
Lymphocytes Relative: 17 %
Lymphs Abs: 1 10*3/uL (ref 0.7–4.0)
MCH: 30.3 pg (ref 26.0–34.0)
MCHC: 33.3 g/dL (ref 30.0–36.0)
MCV: 90.9 fL (ref 80.0–100.0)
Monocytes Absolute: 0.5 10*3/uL (ref 0.1–1.0)
Monocytes Relative: 8 %
Neutro Abs: 4.3 10*3/uL (ref 1.7–7.7)
Neutrophils Relative %: 72 %
Platelet Count: 206 10*3/uL (ref 150–400)
RBC: 4.52 MIL/uL (ref 4.22–5.81)
RDW: 12.3 % (ref 11.5–15.5)
WBC Count: 6 10*3/uL (ref 4.0–10.5)
nRBC: 0 % (ref 0.0–0.2)

## 2019-06-09 MED ORDER — PALONOSETRON HCL INJECTION 0.25 MG/5ML
0.2500 mg | Freq: Once | INTRAVENOUS | Status: AC
  Administered 2019-06-09: 0.25 mg via INTRAVENOUS

## 2019-06-09 MED ORDER — SODIUM CHLORIDE 0.9 % IV SOLN
Freq: Once | INTRAVENOUS | Status: AC
  Administered 2019-06-09: 09:00:00 via INTRAVENOUS
  Filled 2019-06-09: qty 250

## 2019-06-09 MED ORDER — SODIUM CHLORIDE 0.9 % IV SOLN
20.0000 mg | Freq: Once | INTRAVENOUS | Status: AC
  Administered 2019-06-09: 20 mg via INTRAVENOUS
  Filled 2019-06-09: qty 20

## 2019-06-09 MED ORDER — FAMOTIDINE IN NACL 20-0.9 MG/50ML-% IV SOLN
INTRAVENOUS | Status: AC
  Filled 2019-06-09: qty 50

## 2019-06-09 MED ORDER — PALONOSETRON HCL INJECTION 0.25 MG/5ML
INTRAVENOUS | Status: AC
  Filled 2019-06-09: qty 5

## 2019-06-09 MED ORDER — DIPHENHYDRAMINE HCL 50 MG/ML IJ SOLN
50.0000 mg | Freq: Once | INTRAMUSCULAR | Status: AC
  Administered 2019-06-09: 09:00:00 50 mg via INTRAVENOUS

## 2019-06-09 MED ORDER — DIPHENHYDRAMINE HCL 50 MG/ML IJ SOLN
INTRAMUSCULAR | Status: AC
  Filled 2019-06-09: qty 1

## 2019-06-09 MED ORDER — SONAFINE EX EMUL
1.0000 "application " | Freq: Two times a day (BID) | CUTANEOUS | Status: DC
  Administered 2019-06-09: 1 via TOPICAL

## 2019-06-09 MED ORDER — SODIUM CHLORIDE 0.9 % IV SOLN
276.4000 mg | Freq: Once | INTRAVENOUS | Status: AC
  Administered 2019-06-09: 280 mg via INTRAVENOUS
  Filled 2019-06-09: qty 28

## 2019-06-09 MED ORDER — FAMOTIDINE IN NACL 20-0.9 MG/50ML-% IV SOLN
20.0000 mg | Freq: Once | INTRAVENOUS | Status: AC
  Administered 2019-06-09: 20 mg via INTRAVENOUS

## 2019-06-09 MED ORDER — SODIUM CHLORIDE 0.9 % IV SOLN
45.0000 mg/m2 | Freq: Once | INTRAVENOUS | Status: AC
  Administered 2019-06-09: 96 mg via INTRAVENOUS
  Filled 2019-06-09: qty 16

## 2019-06-09 NOTE — Progress Notes (Signed)
  Radiation Oncology         [phone] ________________________________  Name: Alan [name] MRN: [id number]  Date: 06/09/2019  DOB: [birth year]  Simulation Verification Note    ICD-10-CM   1. Adenocarcinoma of right lung, stage 3 (HCC)  C34.91 Sonafine emulsion 1 application    Status: outpatient  NARRATIVE: The patient was brought to the treatment unit and placed in the planned treatment position. The clinical setup was verified. Then port films were obtained and uploaded to the radiation oncology medical record software.  The treatment beams were carefully compared against the planned radiation fields. The position location and shape of the radiation fields was reviewed. They targeted volume of tissue appears to be appropriately covered by the radiation beams. Organs at risk appear to be excluded as planned.  Based on my personal review, I approved the simulation verification. The patient's treatment will proceed as planned.  -----------------------------------  Blair Promise, PhD, MD

## 2019-06-09 NOTE — Patient Instructions (Addendum)
[facility] Discharge Instructions for Patients Receiving Chemotherapy  Today you received the following chemotherapy agents Paclitaxel, Carboplatin  To help prevent nausea and vomiting after your treatment, we encourage you to take your nausea medication as directed by your MD.   If you develop nausea and vomiting that is not controlled by your nausea medication, call the clinic.   BELOW ARE SYMPTOMS THAT SHOULD BE REPORTED IMMEDIATELY:  *FEVER GREATER THAN 100.5 F  *CHILLS WITH OR WITHOUT FEVER  NAUSEA AND VOMITING THAT IS NOT CONTROLLED WITH YOUR NAUSEA MEDICATION  *UNUSUAL SHORTNESS OF BREATH  *UNUSUAL BRUISING OR BLEEDING  TENDERNESS IN MOUTH AND THROAT WITH OR WITHOUT PRESENCE OF ULCERS  *URINARY PROBLEMS  *BOWEL PROBLEMS  UNUSUAL RASH Items with * indicate a potential emergency and should be followed up as soon as possible.  Feel free to call the clinic should you have any questions or concerns. The clinic phone number is [phone]. Paclitaxel injection What is this medicine? PACLITAXEL (PAK li TAX el) is a chemotherapy drug. It targets fast dividing cells, like cancer cells, and causes these cells to die. This medicine is used to treat ovarian cancer, breast cancer, lung cancer, Kaposi's sarcoma, and other cancers. This medicine may be used for other purposes; ask your health care provider or pharmacist if you have questions. COMMON BRAND NAME(S): Onxol, Taxol What should I tell my health care provider before I take this medicine? They need to know if you have any of these conditions:  history of irregular heartbeat  liver disease  low blood counts, like low white cell, platelet, or red cell counts  lung or breathing disease, like asthma  tingling of the fingers or toes, or other nerve disorder  an unusual or allergic reaction to paclitaxel, alcohol, polyoxyethylated castor oil, other chemotherapy, other medicines, foods, dyes, or  preservatives  pregnant or trying to get pregnant  breast-feeding How should I use this medicine? This drug is given as an infusion into a vein. It is administered in a hospital or clinic by a specially trained health care professional. Talk to your pediatrician regarding the use of this medicine in children. Special care may be needed. Overdosage: If you think you have taken too much of this medicine contact a poison control center or emergency room at once. NOTE: This medicine is only for you. Do not share this medicine with others. What if I miss a dose? It is important not to miss your dose. Call your doctor or health care professional if you are unable to keep an appointment. What may interact with this medicine? Do not take this medicine with any of the following medications:  disulfiram  metronidazole This medicine may also interact with the following medications:  antiviral medicines for hepatitis, HIV or AIDS  certain antibiotics like erythromycin and clarithromycin  certain medicines for fungal infections like ketoconazole and itraconazole  certain medicines for seizures like carbamazepine, phenobarbital, phenytoin  gemfibrozil  nefazodone  rifampin  St. John's wort This list may not describe all possible interactions. Give your health care provider a list of all the medicines, herbs, non-prescription drugs, or dietary supplements you use. Also tell them if you smoke, drink alcohol, or use illegal drugs. Some items may interact with your medicine. What should I watch for while using this medicine? Your condition will be monitored carefully while you are receiving this medicine. You will need important blood work done while you are taking this medicine. This medicine can cause serious allergic  reactions. To reduce your risk you will need to take other medicine(s) before treatment with this medicine. If you experience allergic reactions like skin rash, itching or hives,  swelling of the face, lips, or tongue, tell your doctor or health care professional right away. In some cases, you may be given additional medicines to help with side effects. Follow all directions for their use. This drug may make you feel generally unwell. This is not uncommon, as chemotherapy can affect healthy cells as well as cancer cells. Report any side effects. Continue your course of treatment even though you feel ill unless your doctor tells you to stop. Call your doctor or health care professional for advice if you get a fever, chills or sore throat, or other symptoms of a cold or flu. Do not treat yourself. This drug decreases your body's ability to fight infections. Try to avoid being around people who are sick. This medicine may increase your risk to bruise or bleed. Call your doctor or health care professional if you notice any unusual bleeding. Be careful brushing and flossing your teeth or using a toothpick because you may get an infection or bleed more easily. If you have any dental work done, tell your dentist you are receiving this medicine. Avoid taking products that contain aspirin, acetaminophen, ibuprofen, naproxen, or ketoprofen unless instructed by your doctor. These medicines may hide a fever. Do not become pregnant while taking this medicine. Women should inform their doctor if they wish to become pregnant or think they might be pregnant. There is a potential for serious side effects to an unborn child. Talk to your health care professional or pharmacist for more information. Do not breast-feed an infant while taking this medicine. Men are advised not to father a child while receiving this medicine. This product may contain alcohol. Ask your pharmacist or healthcare provider if this medicine contains alcohol. Be sure to tell all healthcare providers you are taking this medicine. Certain medicines, like metronidazole and disulfiram, can cause an unpleasant reaction when taken with  alcohol. The reaction includes flushing, headache, nausea, vomiting, sweating, and increased thirst. The reaction can last from 30 minutes to several hours. What side effects may I notice from receiving this medicine? Side effects that you should report to your doctor or health care professional as soon as possible:  allergic reactions like skin rash, itching or hives, swelling of the face, lips, or tongue  breathing problems  changes in vision  fast, irregular heartbeat  high or low blood pressure  mouth sores  pain, tingling, numbness in the hands or feet  signs of decreased platelets or bleeding - bruising, pinpoint red spots on the skin, black, tarry stools, blood in the urine  signs of decreased red blood cells - unusually weak or tired, feeling faint or lightheaded, falls  signs of infection - fever or chills, cough, sore throat, pain or difficulty passing urine  signs and symptoms of liver injury like dark yellow or brown urine; general ill feeling or flu-like symptoms; light-colored stools; loss of appetite; nausea; right upper belly pain; unusually weak or tired; yellowing of the eyes or skin  swelling of the ankles, feet, hands  unusually slow heartbeat Side effects that usually do not require medical attention (report to your doctor or health care professional if they continue or are bothersome):  diarrhea  hair loss  loss of appetite  muscle or joint pain  nausea, vomiting  pain, redness, or irritation at site where injected  tiredness  This list may not describe all possible side effects. Call your doctor for medical advice about side effects. You may report side effects to FDA at 1-800-FDA-1088. Where should I keep my medicine? This drug is given in a hospital or clinic and will not be stored at home. NOTE: This sheet is a summary. It may not cover all possible information. If you have questions about this medicine, talk to your doctor, pharmacist, or  health care provider.  2020 Elsevier/Gold Standard (2017-04-03 13:14:55)  Carboplatin injection What is this medicine? CARBOPLATIN (KAR boe pla tin) is a chemotherapy drug. It targets fast dividing cells, like cancer cells, and causes these cells to die. This medicine is used to treat ovarian cancer and many other cancers. This medicine may be used for other purposes; ask your health care provider or pharmacist if you have questions. COMMON BRAND NAME(S): Paraplatin What should I tell my health care provider before I take this medicine? They need to know if you have any of these conditions:  blood disorders  hearing problems  kidney disease  recent or ongoing radiation therapy  an unusual or allergic reaction to carboplatin, cisplatin, other chemotherapy, other medicines, foods, dyes, or preservatives  pregnant or trying to get pregnant  breast-feeding How should I use this medicine? This drug is usually given as an infusion into a vein. It is administered in a hospital or clinic by a specially trained health care professional. Talk to your pediatrician regarding the use of this medicine in children. Special care may be needed. Overdosage: If you think you have taken too much of this medicine contact a poison control center or emergency room at once. NOTE: This medicine is only for you. Do not share this medicine with others. What if I miss a dose? It is important not to miss a dose. Call your doctor or health care professional if you are unable to keep an appointment. What may interact with this medicine?  medicines for seizures  medicines to increase blood counts like filgrastim, pegfilgrastim, sargramostim  some antibiotics like amikacin, gentamicin, neomycin, streptomycin, tobramycin  vaccines Talk to your doctor or health care professional before taking any of these medicines:  acetaminophen  aspirin  ibuprofen  ketoprofen  naproxen This list may not describe  all possible interactions. Give your health care provider a list of all the medicines, herbs, non-prescription drugs, or dietary supplements you use. Also tell them if you smoke, drink alcohol, or use illegal drugs. Some items may interact with your medicine. What should I watch for while using this medicine? Your condition will be monitored carefully while you are receiving this medicine. You will need important blood work done while you are taking this medicine. This drug may make you feel generally unwell. This is not uncommon, as chemotherapy can affect healthy cells as well as cancer cells. Report any side effects. Continue your course of treatment even though you feel ill unless your doctor tells you to stop. In some cases, you may be given additional medicines to help with side effects. Follow all directions for their use. Call your doctor or health care professional for advice if you get a fever, chills or sore throat, or other symptoms of a cold or flu. Do not treat yourself. This drug decreases your body's ability to fight infections. Try to avoid being around people who are sick. This medicine may increase your risk to bruise or bleed. Call your doctor or health care professional if you notice any unusual bleeding.  Be careful brushing and flossing your teeth or using a toothpick because you may get an infection or bleed more easily. If you have any dental work done, tell your dentist you are receiving this medicine. Avoid taking products that contain aspirin, acetaminophen, ibuprofen, naproxen, or ketoprofen unless instructed by your doctor. These medicines may hide a fever. Do not become pregnant while taking this medicine. Women should inform their doctor if they wish to become pregnant or think they might be pregnant. There is a potential for serious side effects to an unborn child. Talk to your health care professional or pharmacist for more information. Do not breast-feed an infant while  taking this medicine. What side effects may I notice from receiving this medicine? Side effects that you should report to your doctor or health care professional as soon as possible:  allergic reactions like skin rash, itching or hives, swelling of the face, lips, or tongue  signs of infection - fever or chills, cough, sore throat, pain or difficulty passing urine  signs of decreased platelets or bleeding - bruising, pinpoint red spots on the skin, black, tarry stools, nosebleeds  signs of decreased red blood cells - unusually weak or tired, fainting spells, lightheadedness  breathing problems  changes in hearing  changes in vision  chest pain  high blood pressure  low blood counts - This drug may decrease the number of white blood cells, red blood cells and platelets. You may be at increased risk for infections and bleeding.  nausea and vomiting  pain, swelling, redness or irritation at the injection site  pain, tingling, numbness in the hands or feet  problems with balance, talking, walking  trouble passing urine or change in the amount of urine Side effects that usually do not require medical attention (report to your doctor or health care professional if they continue or are bothersome):  hair loss  loss of appetite  metallic taste in the mouth or changes in taste This list may not describe all possible side effects. Call your doctor for medical advice about side effects. You may report side effects to FDA at 1-800-FDA-1088. Where should I keep my medicine? This drug is given in a hospital or clinic and will not be stored at home. NOTE: This sheet is a summary. It may not cover all possible information. If you have questions about this medicine, talk to your doctor, pharmacist, or health care provider.  2020 Elsevier/Gold Standard (2007-11-05 14:38:05)    Please show the [facility] at check-in to the Emergency Department and triage nurse. Coronavirus  (COVID-19) Are you at risk?  Are you at risk for the Coronavirus (COVID-19)?  To be considered HIGH RISK for Coronavirus (COVID-19), you have to meet the following criteria:  . Traveled to [place], [place], [place], [place] or [place]; or in the [place] to [place], [place], [place], or [place]; and have fever, cough, and shortness of breath within the last 2 weeks of travel OR . Been in close contact with a person diagnosed with COVID-19 within the last 2 weeks and have fever, cough, and shortness of breath . IF YOU DO NOT MEET THESE CRITERIA, YOU ARE CONSIDERED LOW RISK FOR COVID-19.  What to do if you are HIGH RISK for COVID-19?  Marland Kitchen If you are having a medical emergency, call 911. . Seek medical care right away. Before you go to a doctor's office, urgent care or emergency department, call ahead and tell them about your recent travel,  contact with someone diagnosed with COVID-19, and your symptoms. You should receive instructions from your physician's office regarding next steps of care.  . When you arrive at healthcare provider, tell the healthcare staff immediately you have returned from visiting [place], [place], [place], [place] or [place]; or traveled in the [place] to [place], [place], [place], or [place]; in the last two weeks or you have been in close contact with a person diagnosed with COVID-19 in the last 2 weeks.   . Tell the health care staff about your symptoms: fever, cough and shortness of breath. . After you have been seen by a medical provider, you will be either: o Tested for (COVID-19) and discharged home on quarantine except to seek medical care if symptoms worsen, and asked to  - Stay home and avoid contact with others until you get your results (4-5 days)  - Avoid travel on public transportation if possible (such as bus, train, or airplane) or o Sent to the Emergency Department by EMS for evaluation, COVID-19 testing, and possible admission depending  on your condition and test results.  What to do if you are LOW RISK for COVID-19?  Reduce your risk of any infection by using the same precautions used for avoiding the common cold or flu:  Marland Kitchen Wash your hands often with soap and warm water for at least 20 seconds.  If soap and water are not readily available, use an alcohol-based hand sanitizer with at least 60% alcohol.  . If coughing or sneezing, cover your mouth and nose by coughing or sneezing into the elbow areas of your shirt or coat, into a tissue or into your sleeve (not your hands). . Avoid shaking hands with others and consider head nods or verbal greetings only. . Avoid touching your eyes, nose, or mouth with unwashed hands.  . Avoid close contact with people who are sick. . Avoid places or events with large numbers of people in one location, like concerts or sporting events. . Carefully consider travel plans you have or are making. . If you are planning any travel outside or inside the [place], visit the CDC's Travelers' Health webpage for the latest health notices. . If you have some symptoms but not all symptoms, continue to monitor at home and seek medical attention if your symptoms worsen. . If you are having a medical emergency, call 911.   [facility] / e-Visit: [email or web address]         [facility]: [facility]: [phone]                   [facility]: [phone]

## 2019-06-10 ENCOUNTER — Other Ambulatory Visit: Payer: Self-pay

## 2019-06-10 ENCOUNTER — Telehealth: Payer: Self-pay | Admitting: *Deleted

## 2019-06-10 ENCOUNTER — Ambulatory Visit
Admission: RE | Admit: 2019-06-10 | Discharge: 2019-06-10 | Disposition: A | Discharge status: Home / Self Care | Payer: Federal, State, Local not specified - PPO | Source: Ambulatory Visit | Attending: Radiation Oncology | Admitting: Radiation Oncology

## 2019-06-10 DIAGNOSIS — Z510 Encounter for antineoplastic radiation therapy: Secondary | ICD-10-CM | POA: Diagnosis not present

## 2019-06-10 NOTE — Telephone Encounter (Signed)
Called pt to see how he did with his chemo treatment yest & he reports no problems.  He states he understands the side effects & when to call & knows how to reach [place].

## 2019-06-10 NOTE — Telephone Encounter (Signed)
-----   Message from Lester Endeavor, RN sent at 06/09/2019  2:31 PM EDT ----- Regarding: Dr. Julien Nordmann Pt. received first time Taxol and Carboplatin, tolerated both well without any issues.

## 2019-06-11 ENCOUNTER — Other Ambulatory Visit: Payer: Self-pay

## 2019-06-11 ENCOUNTER — Ambulatory Visit
Admission: RE | Admit: 2019-06-11 | Discharge: 2019-06-11 | Disposition: A | Discharge status: Home / Self Care | Payer: Federal, State, Local not specified - PPO | Source: Ambulatory Visit | Attending: Radiation Oncology | Admitting: Radiation Oncology

## 2019-06-11 DIAGNOSIS — Z510 Encounter for antineoplastic radiation therapy: Secondary | ICD-10-CM | POA: Diagnosis not present

## 2019-06-12 ENCOUNTER — Other Ambulatory Visit: Payer: Self-pay

## 2019-06-12 ENCOUNTER — Ambulatory Visit
Admission: RE | Admit: 2019-06-12 | Discharge: 2019-06-12 | Disposition: A | Discharge status: Home / Self Care | Payer: Federal, State, Local not specified - PPO | Source: Ambulatory Visit | Attending: Radiation Oncology | Admitting: Radiation Oncology

## 2019-06-12 DIAGNOSIS — Z510 Encounter for antineoplastic radiation therapy: Secondary | ICD-10-CM | POA: Diagnosis not present

## 2019-06-13 ENCOUNTER — Other Ambulatory Visit: Payer: Self-pay

## 2019-06-13 ENCOUNTER — Ambulatory Visit
Admission: RE | Admit: 2019-06-13 | Discharge: 2019-06-13 | Disposition: A | Discharge status: Home / Self Care | Payer: Federal, State, Local not specified - PPO | Source: Ambulatory Visit | Attending: Radiation Oncology | Admitting: Radiation Oncology

## 2019-06-13 DIAGNOSIS — Z510 Encounter for antineoplastic radiation therapy: Secondary | ICD-10-CM | POA: Diagnosis not present

## 2019-06-13 LAB — GUARDANT 360

## 2019-06-16 ENCOUNTER — Encounter: Payer: Self-pay | Admitting: Internal Medicine

## 2019-06-16 ENCOUNTER — Inpatient Hospital Stay: Payer: Federal, State, Local not specified - PPO

## 2019-06-16 ENCOUNTER — Ambulatory Visit
Admission: RE | Admit: 2019-06-16 | Discharge: 2019-06-16 | Disposition: A | Discharge status: Home / Self Care | Payer: Federal, State, Local not specified - PPO | Source: Ambulatory Visit | Attending: Radiation Oncology | Admitting: Radiation Oncology

## 2019-06-16 ENCOUNTER — Other Ambulatory Visit: Payer: Self-pay

## 2019-06-16 ENCOUNTER — Inpatient Hospital Stay: Payer: Federal, State, Local not specified - PPO | Attending: Internal Medicine | Admitting: Internal Medicine

## 2019-06-16 VITALS — BP 127/81 | HR 73 | Temp 97.8°F | Resp 18 | Ht 76.0 in | Wt 184.9 lb

## 2019-06-16 DIAGNOSIS — C3431 Malignant neoplasm of lower lobe, right bronchus or lung: Secondary | ICD-10-CM | POA: Insufficient documentation

## 2019-06-16 DIAGNOSIS — C3491 Malignant neoplasm of unspecified part of right bronchus or lung: Secondary | ICD-10-CM | POA: Diagnosis present

## 2019-06-16 DIAGNOSIS — Z5111 Encounter for antineoplastic chemotherapy: Secondary | ICD-10-CM | POA: Insufficient documentation

## 2019-06-16 DIAGNOSIS — Z510 Encounter for antineoplastic radiation therapy: Secondary | ICD-10-CM | POA: Diagnosis not present

## 2019-06-16 LAB — CBC WITH DIFFERENTIAL (CANCER CENTER ONLY)
Abs Immature Granulocytes: 0.03 10*3/uL (ref 0.00–0.07)
Basophils Absolute: 0 10*3/uL (ref 0.0–0.1)
Basophils Relative: 1 %
Eosinophils Absolute: 0.1 10*3/uL (ref 0.0–0.5)
Eosinophils Relative: 3 %
HCT: 40.1 % (ref 39.0–52.0)
Hemoglobin: 13.3 g/dL (ref 13.0–17.0)
Immature Granulocytes: 1 %
Lymphocytes Relative: 16 %
Lymphs Abs: 0.7 10*3/uL (ref 0.7–4.0)
MCH: 31.2 pg (ref 26.0–34.0)
MCHC: 33.2 g/dL (ref 30.0–36.0)
MCV: 94.1 fL (ref 80.0–100.0)
Monocytes Absolute: 0.3 10*3/uL (ref 0.1–1.0)
Monocytes Relative: 8 %
Neutro Abs: 3.2 10*3/uL (ref 1.7–7.7)
Neutrophils Relative %: 71 %
Platelet Count: 187 10*3/uL (ref 150–400)
RBC: 4.26 MIL/uL (ref 4.22–5.81)
RDW: 12.3 % (ref 11.5–15.5)
WBC Count: 4.4 10*3/uL (ref 4.0–10.5)
nRBC: 0 % (ref 0.0–0.2)

## 2019-06-16 LAB — CMP (CANCER CENTER ONLY)
ALT: 18 U/L (ref 0–44)
AST: 19 U/L (ref 15–41)
Albumin: 4.1 g/dL (ref 3.5–5.0)
Alkaline Phosphatase: 87 U/L (ref 38–126)
Anion gap: 10 (ref 5–15)
BUN: 14 mg/dL (ref 8–23)
CO2: 24 mmol/L (ref 22–32)
Calcium: 9.4 mg/dL (ref 8.9–10.3)
Chloride: 107 mmol/L (ref 98–111)
Creatinine: 0.77 mg/dL (ref 0.61–1.24)
GFR, Est AFR Am: >60 mL/min (ref >60)
GFR, Estimated: >60 mL/min (ref >60)
Glucose, Bld: 87 mg/dL (ref 70–99)
Potassium: 4.1 mmol/L (ref 3.5–5.1)
Sodium: 141 mmol/L (ref 135–145)
Total Bilirubin: 0.8 mg/dL (ref 0.3–1.2)
Total Protein: 6.9 g/dL (ref 6.5–8.1)

## 2019-06-16 MED ORDER — SODIUM CHLORIDE 0.9 % IV SOLN
Freq: Once | INTRAVENOUS | Status: AC
  Administered 2019-06-16: 13:00:00 via INTRAVENOUS
  Filled 2019-06-16: qty 250

## 2019-06-16 MED ORDER — DIPHENHYDRAMINE HCL 50 MG/ML IJ SOLN
INTRAMUSCULAR | Status: AC
  Filled 2019-06-16: qty 1

## 2019-06-16 MED ORDER — FAMOTIDINE IN NACL 20-0.9 MG/50ML-% IV SOLN
20.0000 mg | Freq: Once | INTRAVENOUS | Status: AC
  Administered 2019-06-16: 20 mg via INTRAVENOUS

## 2019-06-16 MED ORDER — FAMOTIDINE IN NACL 20-0.9 MG/50ML-% IV SOLN
INTRAVENOUS | Status: AC
  Filled 2019-06-16: qty 50

## 2019-06-16 MED ORDER — SODIUM CHLORIDE 0.9 % IV SOLN
276.4000 mg | Freq: Once | INTRAVENOUS | Status: AC
  Administered 2019-06-16: 16:00:00 280 mg via INTRAVENOUS
  Filled 2019-06-16: qty 28

## 2019-06-16 MED ORDER — SODIUM CHLORIDE 0.9 % IV SOLN
20.0000 mg | Freq: Once | INTRAVENOUS | Status: AC
  Administered 2019-06-16: 20 mg via INTRAVENOUS
  Filled 2019-06-16: qty 20

## 2019-06-16 MED ORDER — DIPHENHYDRAMINE HCL 50 MG/ML IJ SOLN
50.0000 mg | Freq: Once | INTRAMUSCULAR | Status: AC
  Administered 2019-06-16: 50 mg via INTRAVENOUS

## 2019-06-16 MED ORDER — SODIUM CHLORIDE 0.9 % IV SOLN
45.0000 mg/m2 | Freq: Once | INTRAVENOUS | Status: AC
  Administered 2019-06-16: 96 mg via INTRAVENOUS
  Filled 2019-06-16: qty 16

## 2019-06-16 MED ORDER — PALONOSETRON HCL INJECTION 0.25 MG/5ML
0.2500 mg | Freq: Once | INTRAVENOUS | Status: AC
  Administered 2019-06-16: 0.25 mg via INTRAVENOUS

## 2019-06-16 MED ORDER — PALONOSETRON HCL INJECTION 0.25 MG/5ML
INTRAVENOUS | Status: AC
  Filled 2019-06-16: qty 5

## 2019-06-16 NOTE — Progress Notes (Signed)
[place] Telephone:([phone]) [phone]   Fax:([phone]  OFFICE PROGRESS NOTE  [name], L.[name], [place] [address] [place] [address]  DIAGNOSIS: stage IIIb (T2b, N3, M0) non-small cell lung cancer, adenocarcinoma presented with right lower lobe lung nodule in addition to right hilar mass/node as well as ipsilateral and contralateral mediastinal lymphadenopathy diagnosed in October 2020.  Molecular studies by [employer] showed no actionable mutations.  PRIOR THERAPY:None.  CURRENT THERAPY: Concurrent chemoradiation with weekly carboplatin for AUC of 2 and paclitaxel 45 mg/M2.  Status post 1 cycle.  INTERVAL HISTORY: Alan [name] 63 y.o. male returns to the clinic today for follow-up visit.  The patient is feeling fine today with no concerning complaints.  He had one episode of nausea after the first week of his treatment.  He denied having any recent weight loss or night sweats.  He has no nausea, vomiting, diarrhea or constipation.  He has no headache or visual changes.  He denied having any chest pain, shortness of breath, cough or hemoptysis.  He tolerated the first week of his treatment fairly well.  He had molecular studies performed by [employer] that showed no actionable mutations.  He is here today for evaluation before starting cycle #2.  MEDICAL HISTORY: Past Medical History:  Diagnosis Date   CAD S/P percutaneous coronary angioplasty 2004   which showed essentially normal LV size and function, moderately dilated left atrium, moderate mitral prolapse with mild to moderate regurgitation.   Dyslipidemia, goal LDL below 70    Essential hypertension    GERD (gastroesophageal reflux disease)    History of Mitral valve prolapse    Moderate - with moderate MR, noted February t 2013   History of Severe mitral regurgitation by prior echocardiogram 02/06/2014   TEE: Severe mitral regurgitation with a flail P2 segment and ruptured;  Normal LV size & function, dilated LA.   History of stress test 12/2009   he walked 9 mins reaching 10 METS. There was an attenuation artifact in the inferior region but no ischemia or infaract, low risk.   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    ALLERGIES:  is allergic to a-cillin [ampicillin]; amoxicillin; other; and penicillins.  MEDICATIONS:  Current Outpatient Medications  Medication Sig Dispense Refill   Ascorbic Acid (VITAMIN C) 1000 MG tablet Take 1,000 mg by mouth daily.     Coenzyme Q10 (CO Q 10) 100 MG CAPS Take 100 mg by mouth daily.      Probiotic Product (PROBIOTIC DAILY PO) Take 1 capsule by mouth daily.      prochlorperazine (COMPAZINE) 10 MG tablet Take 1 tablet (10 mg total) by mouth every 6 (six) hours as needed for nausea or vomiting. 30 tablet 0   Specialty Vitamins Products (PROSTATE PO) Take 1 capsule by mouth daily. PROSTATE FORMULA     Vitamin  D-Vitamin K (D3 + K2 DOTS PO) Take 1 tablet by mouth daily.     zolpidem (AMBIEN) 5 MG tablet TAKE 1 TABLET BY MOUTH EVERY NIGHT AT BEDTIME AS NEEDED FOR SLEEP (Patient taking differently: Take 5 mg by mouth at bedtime as needed for sleep. ) 90 tablet 2   No current facility-administered medications for this visit.     SURGICAL HISTORY:  Past Surgical History:  Procedure Laterality Date   ANTERIOR  CRUCIATE LIGAMENT REPAIR Left 1993   CARDIAC CATHETERIZATION  2004   he had a proximal LAD occlusion treated with 2 overlapping 3.5 x 1.8 mm Cypher DES stents.   CARDIAC CATHETERIZATION  2007; 03/2014   Widely patent LAD stents, 40% distal LAD, 30% Cx, ~50% PL-PDA bifurcation lesion    COLONOSCOPY     INTRAOPERATIVE TRANSESOPHAGEAL ECHOCARDIOGRAM N/A 04/08/2014   Procedure: INTRAOPERATIVE TRANSESOPHAGEAL ECHOCARDIOGRAM;  Surgeon: Rexene Alberts, MD;  Location: [place];  Service: Open Heart Surgery;  Laterality: N/A;   LEFT AND RIGHT HEART CATHETERIZATION WITH CORONARY ANGIOGRAM N/A 03/05/2014   Procedure: LEFT AND RIGHT HEART CATHETERIZATION WITH CORONARY ANGIOGRAM;  Surgeon: Leonie Man, MD;  Location: [facility] C[facility] LAB;  Service: Cardiovascular;  Laterality: N/A;   LEFT HEART CATHETERIZATION WITH CORONARY ANGIOGRAM N/A 09/15/2011   Procedure: LEFT HEART CATHETERIZATION WITH CORONARY ANGIOGRAM;  Surgeon: Lorretta Harp, MD;  Location: [facility] CATH LAB;  Service: Cardiovascular;  Laterality: N/A;   MITRAL VALVE REPAIR Right 04/08/2014   Procedure: MINIMALLY INVASIVE MITRAL VALVE REPAIR (MVR);  Surgeon: Rexene Alberts, MD;  Location: [place];  Service: Open Heart Surgery;  Laterality: Right;   NM MYOVIEW LTD  May 2011   Walk 9 minutes, and 10 METs, diaphragmatic attenuation but no ischemia or infarction.   TEE WITHOUT CARDIOVERSION N/A 02/06/2014   Procedure: TRANSESOPHAGEAL ECHOCARDIOGRAM (TEE);  Surgeon: Pixie Casino, MD;  Normal LV Size U& function - EF 55-60%, no regional WMA.  MV P2 Leaflet is flail with ruptured chord with severe prolapse, anterior leaflet intact.  Severe, eccentric anterior directed MR with dilated LA.   TRANSTHORACIC ECHOCARDIOGRAM  07/18/2018   Normal LV size and function.  EF 50-60 %.  Unable to assess diastolic function.  Mitral valve sewing ring in place.  Mild stenosis with a gradient of 6 mmHg noted. -   TRANSTHORACIC ECHOCARDIOGRAM  June 30 2015   Low normal  LV function with EF 50-55%. GR 1 DD. No MR. Normal gradient of 4 mmHg the mitral valve. No prolapse.   VIDEO BRONCHOSCOPY WITH ENDOBRONCHIAL ULTRASOUND N/A 05/16/2019   Procedure: VIDEO BRONCHOSCOPY WITH ENDOBRONCHIAL ULTRASOUND;  Surgeon: Marshell Garfinkel, MD;  Location: [place];  Service: Pulmonary;  Laterality: N/A;    REVIEW OF SYSTEMS:  A comprehensive review of systems was negative.   PHYSICAL EXAMINATION: General appearance: alert, cooperative and no distress Head: Normocephalic, without obvious abnormality, atraumatic Neck: no adenopathy, no JVD, supple, symmetrical, trachea midline and thyroid not enlarged, symmetric, no tenderness/mass/nodules Lymph nodes: Cervical, supraclavicular, and axillary nodes normal. Resp: clear to auscultation bilaterally Back: symmetric, no curvature. ROM normal. No CVA tenderness. Cardio: regular rate and rhythm, S1, S2 normal, no murmur, click, rub or gallop GI: soft, non-tender; bowel sounds normal; no masses,  no organomegaly Extremities: extremities normal, atraumatic, no cyanosis or edema  ECOG PERFORMANCE STATUS: 1 - Symptomatic but completely ambulatory  Blood pressure 127/81, pulse 73, temperature 97.8 F (36.6 C), temperature source Temporal, resp. rate 18, height 6\' 4"  (1.93 m), weight 184 lb  14.4 oz (83.9 kg), SpO2 100 %.  LABORATORY DATA: Lab Results  Component Value Date   WBC 4.4 06/16/2019   HGB 13.3 06/16/2019   HCT 40.1 06/16/2019   MCV 94.1 06/16/2019   PLT 187 06/16/2019      Chemistry      Component Value Date/Time   NA 141 06/16/2019 1123   K 4.1 06/16/2019 1123   CL 107 06/16/2019 1123   CO2 24 06/16/2019 1123   BUN 14 06/16/2019 1123   CREATININE 0.77 06/16/2019 1123   CREATININE 0.80 02/27/2014 1152      Component Value Date/Time   CALCIUM 9.4 06/16/2019 1123   ALKPHOS 87 06/16/2019 1123   AST 19 06/16/2019 1123   ALT 18 06/16/2019 1123   BILITOT 0.8 06/16/2019 1123       RADIOGRAPHIC STUDIES: Mr Jeri Cos  WG Contrast  Result Date: 06/04/2019 CLINICAL DATA:  63 year old male with non-small cell lung cancer. Staging. EXAM: MRI HEAD WITHOUT AND WITH CONTRAST TECHNIQUE: Multiplanar, multiecho pulse sequences of the brain and surrounding structures were obtained without and with intravenous contrast. CONTRAST:  21mL GADAVIST GADOBUTROL 1 MMOL/ML IV SOLN COMPARISON:  PET-CT 05/15/2019. head CT 07/26/2018. FINDINGS: Brain: No abnormal enhancement identified. No midline shift, mass effect, or evidence of intracranial mass lesion. No dural thickening. No restricted diffusion to suggest acute infarction. No ventriculomegaly, extra-axial collection or acute intracranial hemorrhage. Cervicomedullary junction and pituitary are within normal limits. Generally mild for age scattered nonspecific cerebral white matter T2 and FLAIR hyperintensity. There is more intense involvement of the posterior left lentiform or deep white matter capsules. There is also a small chronic microhemorrhage in the right lower parietal lobe on series 7, image 20. But no cortical encephalomalacia or other chronic cerebral blood products. Possible small chronic lacunar infarct in the left cerebellum on series 5, image 7. Negative brainstem. Vascular: Major intracranial vascular flow voids are preserved. The major dural venous sinuses are enhancing and appear to be patent. Skull and upper cervical spine: Negative visible cervical spine and spinal cord. Visualized bone marrow signal is within normal limits. Sinuses/Orbits: Negative orbits. Negative paranasal sinuses. Other: Mastoids are clear. Visible internal auditory structures appear normal. Normal stylomastoid foramina. Scalp and face soft tissues appear negative. IMPRESSION: 1. No metastatic disease or acute intracranial abnormality. 2. Mild for age signal changes in the brain, including a solitary chronic microhemorrhage in the right parietal lobe, compatible with chronic small vessel disease.  Electronically Signed   By: Genevie Ann M.D.   On: 06/04/2019 16:42    ASSESSMENT AND PLAN: This is a very pleasant 63 years old white male recently diagnosed with a stage IIIb non-small cell lung cancer, adenocarcinoma with no actionable mutation.  He is currently undergoing a course of concurrent chemoradiation with weekly carboplatin and paclitaxel status post 1 cycle.  The patient tolerated the first cycle of his treatment well with no concerning adverse effects. I recommended for him to proceed with cycle #2 today as planned. The patient will come back for follow-up visit in 2 weeks for evaluation before starting cycle #4. He was advised to call immediately if he has any concerning symptoms in the interval. The patient voices understanding of current disease status and treatment options and is in agreement with the current care plan.  All questions were answered. The patient knows to call the clinic with any problems, questions or concerns. We can certainly see the patient much sooner if necessary.  I spent 10 minutes counseling the  patient face to face. The total time spent in the appointment was 15 minutes.  Disclaimer: This note was dictated with voice recognition software. Similar sounding words can inadvertently be transcribed and may not be corrected upon review.

## 2019-06-16 NOTE — Patient Instructions (Signed)
[facility] Discharge Instructions for Patients Receiving Chemotherapy  Today you received the following chemotherapy agents Paclitaxel (TAXOL) & Carboplatin (PARAPLATIN).  To help prevent nausea and vomiting after your treatment, we encourage you to take your nausea medication as prescribed.   If you develop nausea and vomiting that is not controlled by your nausea medication, call the clinic.   BELOW ARE SYMPTOMS THAT SHOULD BE REPORTED IMMEDIATELY:  *FEVER GREATER THAN 100.5 F  *CHILLS WITH OR WITHOUT FEVER  NAUSEA AND VOMITING THAT IS NOT CONTROLLED WITH YOUR NAUSEA MEDICATION  *UNUSUAL SHORTNESS OF BREATH  *UNUSUAL BRUISING OR BLEEDING  TENDERNESS IN MOUTH AND THROAT WITH OR WITHOUT PRESENCE OF ULCERS  *URINARY PROBLEMS  *BOWEL PROBLEMS  UNUSUAL RASH Items with * indicate a potential emergency and should be followed up as soon as possible.  Feel free to call the clinic should you have any questions or concerns. The clinic phone number is [phone].  Please show the CHEMO ALERT CARD at check-in to the Emergency Department and triage nurse.  Coronavirus (COVID-19) Are you at risk?  Are you at risk for the Coronavirus (COVID-19)?  To be considered HIGH RISK for Coronavirus (COVID-19), you have to meet the following criteria:  . Traveled to [place], [place], [place], [place] or [place]; or in the [place] to [place], [place], [place], or [place]; and have fever, cough, and shortness of breath within the last 2 weeks of travel OR . Been in close contact with a person diagnosed with COVID-19 within the last 2 weeks and have fever, cough, and shortness of breath . IF YOU DO NOT MEET THESE CRITERIA, YOU ARE CONSIDERED LOW RISK FOR COVID-19.  What to do if you are HIGH RISK for COVID-19?  . If you are having a medical emergency, call 911. . Seek medical care right away. Before you go to a doctor's office, urgent care or emergency department,  call ahead and tell them about your recent travel, contact with someone diagnosed with COVID-19, and your symptoms. You should receive instructions from your physician's office regarding next steps of care.  . When you arrive at healthcare provider, tell the healthcare staff immediately you have returned from visiting [place], [place], [place], [place] or [place]; or traveled in the [place] to [place], [place], [place], or [place]; in the last two weeks or you have been in close contact with a person diagnosed with COVID-19 in the last 2 weeks.   . Tell the health care staff about your symptoms: fever, cough and shortness of breath. . After you have been seen by a medical provider, you will be either: o Tested for (COVID-19) and discharged home on quarantine except to seek medical care if symptoms worsen, and asked to  - Stay home and avoid contact with others until you get your results (4-5 days)  - Avoid travel on public transportation if possible (such as bus, train, or airplane) or o Sent to the Emergency Department by EMS for evaluation, COVID-19 testing, and possible admission depending on your condition and test results.  What to do if you are LOW RISK for COVID-19?  Reduce your risk of any infection by using the same precautions used for avoiding the common cold or flu:  . Wash your hands often with soap and warm water for at least 20 seconds.  If soap and water are not readily available, use an alcohol-based hand sanitizer with at least 60% alcohol.  .   If coughing or sneezing, cover your mouth and nose by coughing or sneezing into the elbow areas of your shirt or coat, into a tissue or into your sleeve (not your hands). . Avoid shaking hands with others and consider head nods or verbal greetings only. . Avoid touching your eyes, nose, or mouth with unwashed hands.  . Avoid close contact with people who are sick. . Avoid places or events with large numbers of people in one  location, like concerts or sporting events. . Carefully consider travel plans you have or are making. . If you are planning any travel outside or inside the [place], visit the CDC's Travelers' Health webpage for the latest health notices. . If you have some symptoms but not all symptoms, continue to monitor at home and seek medical attention if your symptoms worsen. . If you are having a medical emergency, call 911.   ADDITIONAL HEALTHCARE OPTIONS FOR PATIENTS  [facility] / e-Visit: [email or web address]         [facility]: [phone]  [facility]: [phone]                   [facility]: [phone]   

## 2019-06-17 ENCOUNTER — Ambulatory Visit
Admission: RE | Admit: 2019-06-17 | Discharge: 2019-06-17 | Disposition: A | Discharge status: Home / Self Care | Payer: Federal, State, Local not specified - PPO | Source: Ambulatory Visit | Attending: Radiation Oncology | Admitting: Radiation Oncology

## 2019-06-17 ENCOUNTER — Other Ambulatory Visit: Payer: Self-pay

## 2019-06-17 DIAGNOSIS — Z510 Encounter for antineoplastic radiation therapy: Secondary | ICD-10-CM | POA: Diagnosis not present

## 2019-06-18 ENCOUNTER — Ambulatory Visit
Admission: RE | Admit: 2019-06-18 | Discharge: 2019-06-18 | Disposition: A | Discharge status: Home / Self Care | Payer: Federal, State, Local not specified - PPO | Source: Ambulatory Visit | Attending: Radiation Oncology | Admitting: Radiation Oncology

## 2019-06-18 ENCOUNTER — Other Ambulatory Visit: Payer: Self-pay

## 2019-06-18 DIAGNOSIS — Z510 Encounter for antineoplastic radiation therapy: Secondary | ICD-10-CM | POA: Diagnosis not present

## 2019-06-19 ENCOUNTER — Ambulatory Visit
Admission: RE | Admit: 2019-06-19 | Discharge: 2019-06-19 | Disposition: A | Discharge status: Home / Self Care | Payer: Federal, State, Local not specified - PPO | Source: Ambulatory Visit | Attending: Radiation Oncology | Admitting: Radiation Oncology

## 2019-06-19 ENCOUNTER — Other Ambulatory Visit: Payer: Self-pay

## 2019-06-19 DIAGNOSIS — Z510 Encounter for antineoplastic radiation therapy: Secondary | ICD-10-CM | POA: Diagnosis not present

## 2019-06-20 ENCOUNTER — Ambulatory Visit
Admission: RE | Admit: 2019-06-20 | Discharge: 2019-06-20 | Disposition: A | Discharge status: Home / Self Care | Payer: Federal, State, Local not specified - PPO | Source: Ambulatory Visit | Attending: Radiation Oncology | Admitting: Radiation Oncology

## 2019-06-20 ENCOUNTER — Other Ambulatory Visit: Payer: Self-pay

## 2019-06-20 DIAGNOSIS — Z510 Encounter for antineoplastic radiation therapy: Secondary | ICD-10-CM | POA: Diagnosis not present

## 2019-06-23 ENCOUNTER — Inpatient Hospital Stay: Payer: Federal, State, Local not specified - PPO

## 2019-06-23 ENCOUNTER — Other Ambulatory Visit: Payer: Self-pay

## 2019-06-23 ENCOUNTER — Ambulatory Visit
Admission: RE | Admit: 2019-06-23 | Discharge: 2019-06-23 | Disposition: A | Discharge status: Home / Self Care | Payer: Federal, State, Local not specified - PPO | Source: Ambulatory Visit | Attending: Radiation Oncology | Admitting: Radiation Oncology

## 2019-06-23 VITALS — BP 113/83 | HR 83 | Temp 98.3°F | Resp 18

## 2019-06-23 DIAGNOSIS — C3491 Malignant neoplasm of unspecified part of right bronchus or lung: Secondary | ICD-10-CM

## 2019-06-23 DIAGNOSIS — C3431 Malignant neoplasm of lower lobe, right bronchus or lung: Secondary | ICD-10-CM | POA: Diagnosis not present

## 2019-06-23 DIAGNOSIS — Z510 Encounter for antineoplastic radiation therapy: Secondary | ICD-10-CM | POA: Diagnosis not present

## 2019-06-23 LAB — CMP (CANCER CENTER ONLY)
ALT: 18 U/L (ref 0–44)
AST: 18 U/L (ref 15–41)
Albumin: 3.9 g/dL (ref 3.5–5.0)
Alkaline Phosphatase: 80 U/L (ref 38–126)
Anion gap: 10 (ref 5–15)
BUN: 14 mg/dL (ref 8–23)
CO2: 23 mmol/L (ref 22–32)
Calcium: 9.1 mg/dL (ref 8.9–10.3)
Chloride: 107 mmol/L (ref 98–111)
Creatinine: 0.7 mg/dL (ref 0.61–1.24)
GFR, Est AFR Am: >60 mL/min (ref >60)
GFR, Estimated: >60 mL/min (ref >60)
Glucose, Bld: 96 mg/dL (ref 70–99)
Potassium: 4.1 mmol/L (ref 3.5–5.1)
Sodium: 140 mmol/L (ref 135–145)
Total Bilirubin: 0.6 mg/dL (ref 0.3–1.2)
Total Protein: 6.5 g/dL (ref 6.5–8.1)

## 2019-06-23 LAB — CBC WITH DIFFERENTIAL (CANCER CENTER ONLY)
Abs Immature Granulocytes: 0.04 10*3/uL (ref 0.00–0.07)
Basophils Absolute: 0 10*3/uL (ref 0.0–0.1)
Basophils Relative: 1 %
Eosinophils Absolute: 0.1 10*3/uL (ref 0.0–0.5)
Eosinophils Relative: 2 %
HCT: 36.2 % — ABNORMAL LOW (ref 39.0–52.0)
Hemoglobin: 12.3 g/dL — ABNORMAL LOW (ref 13.0–17.0)
Immature Granulocytes: 1 %
Lymphocytes Relative: 10 %
Lymphs Abs: 0.4 10*3/uL — ABNORMAL LOW (ref 0.7–4.0)
MCH: 31 pg (ref 26.0–34.0)
MCHC: 34 g/dL (ref 30.0–36.0)
MCV: 91.2 fL (ref 80.0–100.0)
Monocytes Absolute: 0.3 10*3/uL (ref 0.1–1.0)
Monocytes Relative: 9 %
Neutro Abs: 2.9 10*3/uL (ref 1.7–7.7)
Neutrophils Relative %: 77 %
Platelet Count: 162 10*3/uL (ref 150–400)
RBC: 3.97 MIL/uL — ABNORMAL LOW (ref 4.22–5.81)
RDW: 12.4 % (ref 11.5–15.5)
WBC Count: 3.7 10*3/uL — ABNORMAL LOW (ref 4.0–10.5)
nRBC: 0 % (ref 0.0–0.2)

## 2019-06-23 MED ORDER — FAMOTIDINE IN NACL 20-0.9 MG/50ML-% IV SOLN
20.0000 mg | Freq: Once | INTRAVENOUS | Status: AC
  Administered 2019-06-23: 14:00:00 20 mg via INTRAVENOUS

## 2019-06-23 MED ORDER — SODIUM CHLORIDE 0.9 % IV SOLN
20.0000 mg | Freq: Once | INTRAVENOUS | Status: AC
  Administered 2019-06-23: 20 mg via INTRAVENOUS
  Filled 2019-06-23: qty 2

## 2019-06-23 MED ORDER — PALONOSETRON HCL INJECTION 0.25 MG/5ML
0.2500 mg | Freq: Once | INTRAVENOUS | Status: AC
  Administered 2019-06-23: 0.25 mg via INTRAVENOUS

## 2019-06-23 MED ORDER — FAMOTIDINE IN NACL 20-0.9 MG/50ML-% IV SOLN
INTRAVENOUS | Status: AC
  Filled 2019-06-23: qty 50

## 2019-06-23 MED ORDER — SODIUM CHLORIDE 0.9 % IV SOLN
Freq: Once | INTRAVENOUS | Status: AC
  Administered 2019-06-23: 13:00:00 via INTRAVENOUS
  Filled 2019-06-23: qty 250

## 2019-06-23 MED ORDER — DIPHENHYDRAMINE HCL 50 MG/ML IJ SOLN
INTRAMUSCULAR | Status: AC
  Filled 2019-06-23: qty 1

## 2019-06-23 MED ORDER — PALONOSETRON HCL INJECTION 0.25 MG/5ML
INTRAVENOUS | Status: AC
  Filled 2019-06-23: qty 5

## 2019-06-23 MED ORDER — SODIUM CHLORIDE 0.9 % IV SOLN
276.4000 mg | Freq: Once | INTRAVENOUS | Status: AC
  Administered 2019-06-23: 280 mg via INTRAVENOUS
  Filled 2019-06-23: qty 28

## 2019-06-23 MED ORDER — SODIUM CHLORIDE 0.9 % IV SOLN
45.0000 mg/m2 | Freq: Once | INTRAVENOUS | Status: AC
  Administered 2019-06-23: 96 mg via INTRAVENOUS
  Filled 2019-06-23: qty 16

## 2019-06-23 MED ORDER — DIPHENHYDRAMINE HCL 50 MG/ML IJ SOLN
50.0000 mg | Freq: Once | INTRAMUSCULAR | Status: AC
  Administered 2019-06-23: 50 mg via INTRAVENOUS

## 2019-06-23 NOTE — Patient Instructions (Signed)
[facility] Discharge Instructions for Patients Receiving Chemotherapy  Today you received the following chemotherapy agents Paclitaxel (TAXOL) & Carboplatin (PARAPLATIN).  To help prevent nausea and vomiting after your treatment, we encourage you to take your nausea medication as prescribed.   If you develop nausea and vomiting that is not controlled by your nausea medication, call the clinic.   BELOW ARE SYMPTOMS THAT SHOULD BE REPORTED IMMEDIATELY:  *FEVER GREATER THAN 100.5 F  *CHILLS WITH OR WITHOUT FEVER  NAUSEA AND VOMITING THAT IS NOT CONTROLLED WITH YOUR NAUSEA MEDICATION  *UNUSUAL SHORTNESS OF BREATH  *UNUSUAL BRUISING OR BLEEDING  TENDERNESS IN MOUTH AND THROAT WITH OR WITHOUT PRESENCE OF ULCERS  *URINARY PROBLEMS  *BOWEL PROBLEMS  UNUSUAL RASH Items with * indicate a potential emergency and should be followed up as soon as possible.  Feel free to call the clinic should you have any questions or concerns. The clinic phone number is [phone].  Please show the CHEMO ALERT CARD at check-in to the Emergency Department and triage nurse.  Coronavirus (COVID-19) Are you at risk?  Are you at risk for the Coronavirus (COVID-19)?  To be considered HIGH RISK for Coronavirus (COVID-19), you have to meet the following criteria:  . Traveled to [place], [place], [place], [place] or [place]; or in the [place] to [place], [place], [place], or [place]; and have fever, cough, and shortness of breath within the last 2 weeks of travel OR . Been in close contact with a person diagnosed with COVID-19 within the last 2 weeks and have fever, cough, and shortness of breath . IF YOU DO NOT MEET THESE CRITERIA, YOU ARE CONSIDERED LOW RISK FOR COVID-19.  What to do if you are HIGH RISK for COVID-19?  . If you are having a medical emergency, call 911. . Seek medical care right away. Before you go to a doctor's office, urgent care or emergency department,  call ahead and tell them about your recent travel, contact with someone diagnosed with COVID-19, and your symptoms. You should receive instructions from your physician's office regarding next steps of care.  . When you arrive at healthcare provider, tell the healthcare staff immediately you have returned from visiting [place], [place], [place], [place] or [place]; or traveled in the [place] to [place], [place], [place], or [place]; in the last two weeks or you have been in close contact with a person diagnosed with COVID-19 in the last 2 weeks.   . Tell the health care staff about your symptoms: fever, cough and shortness of breath. . After you have been seen by a medical provider, you will be either: o Tested for (COVID-19) and discharged home on quarantine except to seek medical care if symptoms worsen, and asked to  - Stay home and avoid contact with others until you get your results (4-5 days)  - Avoid travel on public transportation if possible (such as bus, train, or airplane) or o Sent to the Emergency Department by EMS for evaluation, COVID-19 testing, and possible admission depending on your condition and test results.  What to do if you are LOW RISK for COVID-19?  Reduce your risk of any infection by using the same precautions used for avoiding the common cold or flu:  . Wash your hands often with soap and warm water for at least 20 seconds.  If soap and water are not readily available, use an alcohol-based hand sanitizer with at least 60% alcohol.  .   If coughing or sneezing, cover your mouth and nose by coughing or sneezing into the elbow areas of your shirt or coat, into a tissue or into your sleeve (not your hands). . Avoid shaking hands with others and consider head nods or verbal greetings only. . Avoid touching your eyes, nose, or mouth with unwashed hands.  . Avoid close contact with people who are sick. . Avoid places or events with large numbers of people in one  location, like concerts or sporting events. . Carefully consider travel plans you have or are making. . If you are planning any travel outside or inside the [place], visit the CDC's Travelers' Health webpage for the latest health notices. . If you have some symptoms but not all symptoms, continue to monitor at home and seek medical attention if your symptoms worsen. . If you are having a medical emergency, call 911.   ADDITIONAL HEALTHCARE OPTIONS FOR PATIENTS  [facility] / e-Visit: [email or web address]         [facility]: [phone]  [facility]: [phone]                   [facility]: [phone]   

## 2019-06-24 ENCOUNTER — Ambulatory Visit
Admission: RE | Admit: 2019-06-24 | Discharge: 2019-06-24 | Disposition: A | Discharge status: Home / Self Care | Payer: Federal, State, Local not specified - PPO | Source: Ambulatory Visit | Attending: Radiation Oncology | Admitting: Radiation Oncology

## 2019-06-24 ENCOUNTER — Other Ambulatory Visit: Payer: Self-pay | Admitting: Radiation Oncology

## 2019-06-24 ENCOUNTER — Other Ambulatory Visit: Payer: Self-pay

## 2019-06-24 DIAGNOSIS — Z510 Encounter for antineoplastic radiation therapy: Secondary | ICD-10-CM | POA: Diagnosis not present

## 2019-06-24 MED ORDER — SUCRALFATE 1 G PO TABS: 1.0000 g | ORAL_TABLET | Freq: Three times a day (TID) | ORAL | 1 refills | Status: DC

## 2019-06-25 ENCOUNTER — Other Ambulatory Visit: Payer: Self-pay

## 2019-06-25 ENCOUNTER — Ambulatory Visit
Admission: RE | Admit: 2019-06-25 | Discharge: 2019-06-25 | Disposition: A | Discharge status: Home / Self Care | Payer: Federal, State, Local not specified - PPO | Source: Ambulatory Visit | Attending: Radiation Oncology | Admitting: Radiation Oncology

## 2019-06-25 DIAGNOSIS — Z510 Encounter for antineoplastic radiation therapy: Secondary | ICD-10-CM | POA: Diagnosis not present

## 2019-06-26 ENCOUNTER — Ambulatory Visit
Admission: RE | Admit: 2019-06-26 | Discharge: 2019-06-26 | Disposition: A | Discharge status: Home / Self Care | Payer: Federal, State, Local not specified - PPO | Source: Ambulatory Visit | Attending: Radiation Oncology | Admitting: Radiation Oncology

## 2019-06-26 ENCOUNTER — Other Ambulatory Visit: Payer: Self-pay

## 2019-06-26 DIAGNOSIS — Z510 Encounter for antineoplastic radiation therapy: Secondary | ICD-10-CM | POA: Diagnosis not present

## 2019-06-27 ENCOUNTER — Other Ambulatory Visit: Payer: Self-pay

## 2019-06-27 ENCOUNTER — Ambulatory Visit
Admission: RE | Admit: 2019-06-27 | Discharge: 2019-06-27 | Disposition: A | Discharge status: Home / Self Care | Payer: Federal, State, Local not specified - PPO | Source: Ambulatory Visit | Attending: Radiation Oncology | Admitting: Radiation Oncology

## 2019-06-27 DIAGNOSIS — Z510 Encounter for antineoplastic radiation therapy: Secondary | ICD-10-CM | POA: Diagnosis not present

## 2019-06-30 ENCOUNTER — Other Ambulatory Visit: Payer: Self-pay | Admitting: Medical

## 2019-06-30 ENCOUNTER — Inpatient Hospital Stay: Payer: Federal, State, Local not specified - PPO

## 2019-06-30 ENCOUNTER — Other Ambulatory Visit: Payer: Self-pay

## 2019-06-30 ENCOUNTER — Inpatient Hospital Stay (HOSPITAL_BASED_OUTPATIENT_CLINIC_OR_DEPARTMENT_OTHER): Payer: Federal, State, Local not specified - PPO | Admitting: Physician Assistant

## 2019-06-30 ENCOUNTER — Ambulatory Visit
Admission: RE | Admit: 2019-06-30 | Discharge: 2019-06-30 | Disposition: A | Discharge status: Home / Self Care | Payer: Federal, State, Local not specified - PPO | Source: Ambulatory Visit | Attending: Radiation Oncology | Admitting: Radiation Oncology

## 2019-06-30 ENCOUNTER — Inpatient Hospital Stay (HOSPITAL_BASED_OUTPATIENT_CLINIC_OR_DEPARTMENT_OTHER): Payer: Federal, State, Local not specified - PPO | Admitting: Medical

## 2019-06-30 VITALS — BP 112/82 | HR 82 | Temp 98.5°F | Resp 18 | Ht 76.0 in | Wt 184.3 lb

## 2019-06-30 DIAGNOSIS — Z510 Encounter for antineoplastic radiation therapy: Secondary | ICD-10-CM | POA: Diagnosis not present

## 2019-06-30 DIAGNOSIS — C3491 Malignant neoplasm of unspecified part of right bronchus or lung: Secondary | ICD-10-CM | POA: Diagnosis not present

## 2019-06-30 DIAGNOSIS — Z5111 Encounter for antineoplastic chemotherapy: Secondary | ICD-10-CM | POA: Diagnosis not present

## 2019-06-30 DIAGNOSIS — C3431 Malignant neoplasm of lower lobe, right bronchus or lung: Secondary | ICD-10-CM | POA: Diagnosis not present

## 2019-06-30 DIAGNOSIS — G2581 Restless legs syndrome: Secondary | ICD-10-CM

## 2019-06-30 LAB — CBC WITH DIFFERENTIAL (CANCER CENTER ONLY)
Abs Immature Granulocytes: 0.02 10*3/uL (ref 0.00–0.07)
Basophils Absolute: 0 10*3/uL (ref 0.0–0.1)
Basophils Relative: 1 %
Eosinophils Absolute: 0 10*3/uL (ref 0.0–0.5)
Eosinophils Relative: 1 %
HCT: 37.3 % — ABNORMAL LOW (ref 39.0–52.0)
Hemoglobin: 12.5 g/dL — ABNORMAL LOW (ref 13.0–17.0)
Immature Granulocytes: 1 %
Lymphocytes Relative: 10 %
Lymphs Abs: 0.3 10*3/uL — ABNORMAL LOW (ref 0.7–4.0)
MCH: 31 pg (ref 26.0–34.0)
MCHC: 33.5 g/dL (ref 30.0–36.0)
MCV: 92.6 fL (ref 80.0–100.0)
Monocytes Absolute: 0.3 10*3/uL (ref 0.1–1.0)
Monocytes Relative: 10 %
Neutro Abs: 2.3 10*3/uL (ref 1.7–7.7)
Neutrophils Relative %: 77 %
Platelet Count: 164 10*3/uL (ref 150–400)
RBC: 4.03 MIL/uL — ABNORMAL LOW (ref 4.22–5.81)
RDW: 12.9 % (ref 11.5–15.5)
WBC Count: 3 10*3/uL — ABNORMAL LOW (ref 4.0–10.5)
nRBC: 0 % (ref 0.0–0.2)

## 2019-06-30 LAB — CMP (CANCER CENTER ONLY)
ALT: 18 U/L (ref 0–44)
AST: 17 U/L (ref 15–41)
Albumin: 3.9 g/dL (ref 3.5–5.0)
Alkaline Phosphatase: 85 U/L (ref 38–126)
Anion gap: 11 (ref 5–15)
BUN: 13 mg/dL (ref 8–23)
CO2: 24 mmol/L (ref 22–32)
Calcium: 9 mg/dL (ref 8.9–10.3)
Chloride: 107 mmol/L (ref 98–111)
Creatinine: 0.72 mg/dL (ref 0.61–1.24)
GFR, Est AFR Am: >60 mL/min (ref >60)
GFR, Estimated: >60 mL/min (ref >60)
Glucose, Bld: 90 mg/dL (ref 70–99)
Potassium: 4.1 mmol/L (ref 3.5–5.1)
Sodium: 142 mmol/L (ref 135–145)
Total Bilirubin: 0.7 mg/dL (ref 0.3–1.2)
Total Protein: 6.5 g/dL (ref 6.5–8.1)

## 2019-06-30 MED ORDER — PALONOSETRON HCL INJECTION 0.25 MG/5ML
0.2500 mg | Freq: Once | INTRAVENOUS | Status: AC
  Administered 2019-06-30: 0.25 mg via INTRAVENOUS

## 2019-06-30 MED ORDER — SODIUM CHLORIDE 0.9 % IV SOLN
20.0000 mg | Freq: Once | INTRAVENOUS | Status: AC
  Administered 2019-06-30: 20 mg via INTRAVENOUS
  Filled 2019-06-30: qty 20

## 2019-06-30 MED ORDER — FAMOTIDINE IN NACL 20-0.9 MG/50ML-% IV SOLN
20.0000 mg | Freq: Once | INTRAVENOUS | Status: AC
  Administered 2019-06-30: 12:00:00 20 mg via INTRAVENOUS

## 2019-06-30 MED ORDER — LORAZEPAM 2 MG/ML IJ SOLN
INTRAMUSCULAR | Status: AC
  Filled 2019-06-30: qty 1

## 2019-06-30 MED ORDER — SODIUM CHLORIDE 0.9 % IV SOLN
Freq: Once | INTRAVENOUS | Status: AC
  Administered 2019-06-30: 12:00:00 via INTRAVENOUS
  Filled 2019-06-30: qty 250

## 2019-06-30 MED ORDER — PALONOSETRON HCL INJECTION 0.25 MG/5ML
INTRAVENOUS | Status: AC
  Filled 2019-06-30: qty 5

## 2019-06-30 MED ORDER — DIPHENHYDRAMINE HCL 50 MG/ML IJ SOLN
INTRAMUSCULAR | Status: AC
  Filled 2019-06-30: qty 1

## 2019-06-30 MED ORDER — DIPHENHYDRAMINE HCL 50 MG/ML IJ SOLN
50.0000 mg | Freq: Once | INTRAMUSCULAR | Status: AC
  Administered 2019-06-30: 50 mg via INTRAVENOUS

## 2019-06-30 MED ORDER — SODIUM CHLORIDE 0.9 % IV SOLN
276.4000 mg | Freq: Once | INTRAVENOUS | Status: AC
  Administered 2019-06-30: 14:00:00 280 mg via INTRAVENOUS
  Filled 2019-06-30: qty 28

## 2019-06-30 MED ORDER — FAMOTIDINE IN NACL 20-0.9 MG/50ML-% IV SOLN
INTRAVENOUS | Status: AC
  Filled 2019-06-30: qty 50

## 2019-06-30 MED ORDER — SODIUM CHLORIDE 0.9 % IV SOLN
45.0000 mg/m2 | Freq: Once | INTRAVENOUS | Status: AC
  Administered 2019-06-30: 96 mg via INTRAVENOUS
  Filled 2019-06-30: qty 16

## 2019-06-30 MED ORDER — LORAZEPAM 2 MG/ML IJ SOLN
0.5000 mg | Freq: Once | INTRAMUSCULAR | Status: AC
  Administered 2019-06-30: 0.5 mg via INTRAVENOUS

## 2019-06-30 NOTE — Progress Notes (Signed)
Alan [name] OFFICE PROGRESS NOTE  Alan [name], L.Alan [name], [address] [place] [address]  DIAGNOSIS: stage IIIb (T2b, N3, M0) non-small cell lung cancer, adenocarcinoma presented with right lower lobe lung nodule in addition to right hilar mass/node as well as ipsilateral and contralateral mediastinal lymphadenopathy diagnosed in October 2020.  Molecular studies by [employer] showed no actionable mutations.  PRIOR THERAPY: None  CURRENT THERAPY: Concurrent chemoradiation with weekly carboplatin for AUC of 2 and paclitaxel 45 mg/M2.  Status post 3 cycles.  INTERVAL HISTORY: Alan [name] 63 y.o. male returns to the clinic for a follow-up visit.  The patient is feeling well today without any concerning complaints.  He is status post 3 cycles of weekly concurrent chemoradiation and he is tolerating it well except for mild nausea without vomiting.  He denies any fever, chills, night sweats, or weight loss.  He denies any chest pain or hemoptysis.  He reports that his cough and shortness of breath with exertion are improved since starting treatment.  He denies any diarrhea or constipation.  He denies any headache or visual changes. His last radiation treatment is scheduled for 07/21/2019. He is here today for evaluation before starting cycle #4.  MEDICAL HISTORY: Past Medical History:  Diagnosis Date  . CAD S/P percutaneous coronary angioplasty 2004   which showed essentially normal LV size and function, moderately dilated left atrium, moderate mitral prolapse with mild to moderate regurgitation.  . Dyslipidemia, goal LDL below 70   . Essential hypertension   . GERD (gastroesophageal reflux disease)   . History of Mitral valve prolapse    Moderate - with moderate MR, noted February t 2013  . History of Severe mitral regurgitation by prior echocardiogram 02/06/2014   TEE: Severe mitral regurgitation with a flail P2 segment and ruptured; Normal LV size &  function, dilated LA.  Marland Kitchen History of stress test 12/2009   he walked 9 mins reaching 10 METS. There was an attenuation artifact in the inferior region but no ischemia or infaract, low risk.  . Incidental lung nodule, greater than or equal to 30mm 03/16/2014   Ground glass opacity RML noted on CT scan  . PONV (postoperative nausea and vomiting)   . 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 ST elevation myocardial infarction (STEMI) of anterior wall, subsequent episode of care [facility]) 2004   he had a proximal LAD occlusion treated with 2 overlapping 3.5 x 1.8 mm Cypher DES stents.    ALLERGIES:  is allergic to a-cillin [ampicillin]; amoxicillin; other; and penicillins.  MEDICATIONS:  Current Outpatient Medications  Medication Sig Dispense Refill  . Ascorbic Acid (VITAMIN C) 1000 MG tablet Take 1,000 mg by mouth daily.    . Coenzyme Q10 (CO Q 10) 100 MG CAPS Take 100 mg by mouth daily.     . Probiotic Product (PROBIOTIC DAILY PO) Take 1 capsule by mouth daily.     . prochlorperazine (COMPAZINE) 10 MG tablet Take 1 tablet (10 mg total) by mouth every 6 (six) hours as needed for nausea or vomiting. 30 tablet 0  . Specialty Vitamins Products (PROSTATE PO) Take 1 capsule by mouth daily. PROSTATE FORMULA    . sucralfate (CARAFATE) 1 g tablet Take 1 tablet (1 g  total) by mouth 4 (four) times daily -  with meals and at bedtime. crush and dissolve in 10 cc of water prior to consuming 120 tablet 1  . Vitamin D-Vitamin K (D3 + K2 DOTS PO) Take 1 tablet by mouth daily.    Marland Kitchen zolpidem (AMBIEN) 5 MG tablet TAKE 1 TABLET BY MOUTH EVERY NIGHT AT BEDTIME AS NEEDED FOR SLEEP (Patient taking differently: Take 5 mg by mouth at bedtime as needed for  sleep. ) 90 tablet 2   No current facility-administered medications for this visit.    Facility-Administered Medications Ordered in Other Visits  Medication Dose Route Frequency Provider Last Rate Last Dose  . CARBOplatin (PARAPLATIN) 280 mg in sodium chloride 0.9 % 250 mL chemo infusion  280 mg Intravenous Once Curt Bears, MD 556 mL/hr at 06/30/19 1428 280 mg at 06/30/19 1428    SURGICAL HISTORY:  Past Surgical History:  Procedure Laterality Date  . ANTERIOR CRUCIATE LIGAMENT REPAIR Left 1993  . CARDIAC CATHETERIZATION  2004   he had a proximal LAD occlusion treated with 2 overlapping 3.5 x 1.8 mm Cypher DES stents.  Marland Kitchen CARDIAC CATHETERIZATION  2007; 03/2014   Widely patent LAD stents, 40% distal LAD, 30% Cx, ~50% PL-PDA bifurcation lesion   . COLONOSCOPY    . INTRAOPERATIVE TRANSESOPHAGEAL ECHOCARDIOGRAM N/A 04/08/2014   Procedure: INTRAOPERATIVE TRANSESOPHAGEAL ECHOCARDIOGRAM;  Surgeon: Rexene Alberts, MD;  Location: [place];  Service: Open Heart Surgery;  Laterality: N/A;  . LEFT AND RIGHT HEART CATHETERIZATION WITH CORONARY ANGIOGRAM N/A 03/05/2014   Procedure: LEFT AND RIGHT HEART CATHETERIZATION WITH CORONARY ANGIOGRAM;  Surgeon: Leonie Man, MD;  Location: [facility] CATH LAB;  Service: Cardiovascular;  Laterality: N/A;  . LEFT HEART CATHETERIZATION WITH CORONARY ANGIOGRAM N/A 09/15/2011   Procedure: LEFT HEART CATHETERIZATION WITH CORONARY ANGIOGRAM;  Surgeon: Lorretta Harp, MD;  Location: [facility] CATH LAB;  Service: Cardiovascular;  Laterality: N/A;  . MITRAL VALVE REPAIR Right 04/08/2014   Procedure: MINIMALLY INVASIVE MITRAL VALVE REPAIR (MVR);  Surgeon: Rexene Alberts, MD;  Location: [place];  Service: Open Heart Surgery;  Laterality: Right;  . NM MYOVIEW LTD  May 2011   Walk 9 minutes, and 10 METs, diaphragmatic attenuation but no ischemia or infarction.  . TEE WITHOUT CARDIOVERSION N/A 02/06/2014   Procedure: TRANSESOPHAGEAL ECHOCARDIOGRAM (TEE);  Surgeon: Pixie Casino, MD;   Normal LV Size U& function - EF 55-60%, no regional WMA.  MV P2 Leaflet is flail with ruptured chord with severe prolapse, anterior leaflet intact.  Severe, eccentric anterior directed MR with dilated LA.  Marland Kitchen TRANSTHORACIC ECHOCARDIOGRAM  07/18/2018   Normal LV size and function.  EF 50-60 %.  Unable to assess diastolic function.  Mitral valve sewing ring in place.  Mild stenosis with a gradient of 6 mmHg noted. -  Domingo Dimes ECHOCARDIOGRAM  June 30 2015   Low normal LV function with EF 50-55%. GR 1 DD. No MR. Normal gradient of 4 mmHg the mitral valve. No prolapse.  Marland Kitchen VIDEO BRONCHOSCOPY WITH ENDOBRONCHIAL ULTRASOUND N/A 05/16/2019   Procedure: VIDEO BRONCHOSCOPY WITH ENDOBRONCHIAL ULTRASOUND;  Surgeon: Marshell Garfinkel, MD;  Location: [place];  Service: Pulmonary;  Laterality: N/A;    REVIEW OF SYSTEMS:   Review of Systems  Constitutional: Negative for appetite change, chills, fatigue, fever and unexpected weight change.  HENT: Negative for mouth sores, nosebleeds, sore throat and trouble swallowing.   Eyes: Negative for eye problems and icterus.  Respiratory: Negative for cough, hemoptysis, shortness of breath and  wheezing.   Cardiovascular: Negative for chest pain and leg swelling.  Gastrointestinal: Positive for mild nausea. Negative for abdominal pain, constipation, diarrhea, and vomiting.  Genitourinary: Negative for bladder incontinence, difficulty urinating, dysuria, frequency and hematuria.   Musculoskeletal: Negative for back pain, gait problem, neck pain and neck stiffness.  Skin: Negative for itching and rash.  Neurological: Negative for dizziness, extremity weakness, gait problem, headaches, light-headedness and seizures.  Hematological: Negative for adenopathy. Does not bruise/bleed easily.  Psychiatric/Behavioral: Negative for confusion, depression and sleep disturbance. The patient is not nervous/anxious.     PHYSICAL EXAMINATION:  Blood pressure 112/82, pulse 82,  temperature 98.5 F (36.9 C), temperature source Temporal, resp. rate 18, height 6\' 4"  (1.93 m), weight 184 lb 4.8 oz (83.6 kg), SpO2 98 %.  ECOG PERFORMANCE STATUS: 1 - Symptomatic but completely ambulatory  Physical Exam  Constitutional: Oriented to person, place, and time and well-developed, well-nourished, and in no distress. Marland Kitchen  HENT:  Head: Normocephalic and atraumatic.  Mouth/Throat: Oropharynx is clear and moist. No oropharyngeal exudate.  Eyes: Conjunctivae are normal. Right eye exhibits no discharge. Left eye exhibits no discharge. No scleral icterus.  Neck: Normal range of motion. Neck supple.  Cardiovascular: Normal rate, regular rhythm, normal heart sounds and intact distal pulses.   Pulmonary/Chest: Effort normal and breath sounds normal. No respiratory distress. No wheezes. No rales.  Abdominal: Soft. Bowel sounds are normal. Exhibits no distension and no mass. There is no tenderness.  Musculoskeletal: Normal range of motion. Exhibits no edema.  Lymphadenopathy:    No cervical adenopathy.  Neurological: Alert and oriented to person, place, and time. Exhibits normal muscle tone. Gait normal. Coordination normal.  Skin: Skin is warm and dry. No rash noted. Not diaphoretic. No erythema. No pallor.  Psychiatric: Mood, memory and judgment normal.  Vitals reviewed.  LABORATORY DATA: Lab Results  Component Value Date   WBC 3.0 (L) 06/30/2019   HGB 12.5 (L) 06/30/2019   HCT 37.3 (L) 06/30/2019   MCV 92.6 06/30/2019   PLT 164 06/30/2019      Chemistry      Component Value Date/Time   NA 142 06/30/2019 0950   K 4.1 06/30/2019 0950   CL 107 06/30/2019 0950   CO2 24 06/30/2019 0950   BUN 13 06/30/2019 0950   CREATININE 0.72 06/30/2019 0950   CREATININE 0.80 02/27/2014 1152      Component Value Date/Time   CALCIUM 9.0 06/30/2019 0950   ALKPHOS 85 06/30/2019 0950   AST 17 06/30/2019 0950   ALT 18 06/30/2019 0950   BILITOT 0.7 06/30/2019 0950       RADIOGRAPHIC  STUDIES:  Mr Jeri Cos WN Contrast  Result Date: 06/04/2019 CLINICAL DATA:  63 year old male with non-small cell lung cancer. Staging. EXAM: MRI HEAD WITHOUT AND WITH CONTRAST TECHNIQUE: Multiplanar, multiecho pulse sequences of the brain and surrounding structures were obtained without and with intravenous contrast. CONTRAST:  57mL GADAVIST GADOBUTROL 1 MMOL/ML IV SOLN COMPARISON:  PET-CT 05/15/2019. head CT 07/26/2018. FINDINGS: Brain: No abnormal enhancement identified. No midline shift, mass effect, or evidence of intracranial mass lesion. No dural thickening. No restricted diffusion to suggest acute infarction. No ventriculomegaly, extra-axial collection or acute intracranial hemorrhage. Cervicomedullary junction and pituitary are within normal limits. Generally mild for age scattered nonspecific cerebral white matter T2 and FLAIR hyperintensity. There is more intense involvement of the posterior left lentiform or deep white matter capsules. There is also a small chronic microhemorrhage in the right lower parietal lobe on series  7, image 20. But no cortical encephalomalacia or other chronic cerebral blood products. Possible small chronic lacunar infarct in the left cerebellum on series 5, image 7. Negative brainstem. Vascular: Major intracranial vascular flow voids are preserved. The major dural venous sinuses are enhancing and appear to be patent. Skull and upper cervical spine: Negative visible cervical spine and spinal cord. Visualized bone marrow signal is within normal limits. Sinuses/Orbits: Negative orbits. Negative paranasal sinuses. Other: Mastoids are clear. Visible internal auditory structures appear normal. Normal stylomastoid foramina. Scalp and face soft tissues appear negative. IMPRESSION: 1. No metastatic disease or acute intracranial abnormality. 2. Mild for age signal changes in the brain, including a solitary chronic microhemorrhage in the right parietal lobe, compatible with chronic small  vessel disease. Electronically Signed   By: Genevie Ann M.D.   On: 06/04/2019 16:42     ASSESSMENT/PLAN:  This is a very pleasant 62 year old Caucasian male diagnosed with stage IIIb non-small cell lung cancer, adenocarcinoma.  He presented with a right lower lobe lung mass and right hilar lymphadenopathy and ipsilateral and contralateral mediastinal lymphadenopathy.  He has no actionable mutations.  He was diagnosed in October 2020.  He is currently undergoing concurrent chemoradiation with weekly carboplatin for an AUC of 2 and paclitaxel 45 mg/m.  He is status post 3 cycles.  He is tolerating treatment well without any concerning adverse side effects except for mild nausea which is controlled with his Compazine.  His last radiation treatment is scheduled for 07/21/2019.  Labs were reviewed with the patient.  I recommend he proceed with cycle #4 today scheduled.  We will see the patient back for a follow-up visit in 2 weeks for evaluation before starting cycle #6.  He will continue to use his antiemetics every 6 hours as needed for nausea.  The patient was advised to call immediately if he has any concerning symptoms in the interval. The patient voices understanding of current disease status and treatment options and is in agreement with the current care plan. All questions were answered. The patient knows to call the clinic with any problems, questions or concerns. We can certainly see the patient much sooner if necessary  No orders of the defined types were placed in this encounter.    Ravinder Hofland L Tomisha Reppucci, PA-C 06/30/19

## 2019-06-30 NOTE — Progress Notes (Signed)
Ativan given per Sandi Mealy, PA due to restless legs as reported by patient. See eMAR.

## 2019-06-30 NOTE — Patient Instructions (Signed)
    Cancer Center Discharge Instructions for Patients Receiving Chemotherapy  Today you received the following chemotherapy agents Taxol and Carboplatin   To help prevent nausea and vomiting after your treatment, we encourage you to take your nausea medication as directed.    If you develop nausea and vomiting that is not controlled by your nausea medication, call the clinic.   BELOW ARE SYMPTOMS THAT SHOULD BE REPORTED IMMEDIATELY:  *FEVER GREATER THAN 100.5 F  *CHILLS WITH OR WITHOUT FEVER  NAUSEA AND VOMITING THAT IS NOT CONTROLLED WITH YOUR NAUSEA MEDICATION  *UNUSUAL SHORTNESS OF BREATH  *UNUSUAL BRUISING OR BLEEDING  TENDERNESS IN MOUTH AND THROAT WITH OR WITHOUT PRESENCE OF ULCERS  *URINARY PROBLEMS  *BOWEL PROBLEMS  UNUSUAL RASH Items with * indicate a potential emergency and should be followed up as soon as possible.  Feel free to call the clinic should you have any questions or concerns. The clinic phone number is [phone].  Please show the CHEMO ALERT CARD at check-in to the Emergency Department and triage nurse.   

## 2019-07-01 ENCOUNTER — Emergency Department (HOSPITAL_COMMUNITY): Payer: Federal, State, Local not specified - PPO

## 2019-07-01 ENCOUNTER — Other Ambulatory Visit: Payer: Self-pay

## 2019-07-01 ENCOUNTER — Ambulatory Visit
Admission: RE | Admit: 2019-07-01 | Discharge: 2019-07-01 | Disposition: A | Discharge status: Home / Self Care | Payer: Federal, State, Local not specified - PPO | Source: Ambulatory Visit | Attending: Radiation Oncology | Admitting: Radiation Oncology

## 2019-07-01 ENCOUNTER — Observation Stay (HOSPITAL_COMMUNITY)
Admission: EM | Admit: 2019-07-01 | Discharge: 2019-07-02 | Disposition: A | Discharge status: Home / Self Care | Payer: Federal, State, Local not specified - PPO | Attending: Internal Medicine | Admitting: Internal Medicine

## 2019-07-01 DIAGNOSIS — E876 Hypokalemia: Secondary | ICD-10-CM | POA: Diagnosis not present

## 2019-07-01 DIAGNOSIS — I471 Supraventricular tachycardia, unspecified: Secondary | ICD-10-CM | POA: Diagnosis present

## 2019-07-01 DIAGNOSIS — Z8679 Personal history of other diseases of the circulatory system: Secondary | ICD-10-CM | POA: Insufficient documentation

## 2019-07-01 DIAGNOSIS — R748 Abnormal levels of other serum enzymes: Secondary | ICD-10-CM | POA: Insufficient documentation

## 2019-07-01 DIAGNOSIS — Z510 Encounter for antineoplastic radiation therapy: Secondary | ICD-10-CM | POA: Diagnosis not present

## 2019-07-01 DIAGNOSIS — I341 Nonrheumatic mitral (valve) prolapse: Secondary | ICD-10-CM

## 2019-07-01 DIAGNOSIS — C3491 Malignant neoplasm of unspecified part of right bronchus or lung: Secondary | ICD-10-CM | POA: Diagnosis present

## 2019-07-01 DIAGNOSIS — Z9889 Other specified postprocedural states: Secondary | ICD-10-CM

## 2019-07-01 DIAGNOSIS — Z952 Presence of prosthetic heart valve: Secondary | ICD-10-CM | POA: Diagnosis not present

## 2019-07-01 DIAGNOSIS — R55 Syncope and collapse: Secondary | ICD-10-CM | POA: Diagnosis not present

## 2019-07-01 DIAGNOSIS — I2510 Atherosclerotic heart disease of native coronary artery without angina pectoris: Secondary | ICD-10-CM | POA: Diagnosis not present

## 2019-07-01 DIAGNOSIS — E785 Hyperlipidemia, unspecified: Secondary | ICD-10-CM | POA: Diagnosis present

## 2019-07-01 DIAGNOSIS — R918 Other nonspecific abnormal finding of lung field: Secondary | ICD-10-CM | POA: Diagnosis present

## 2019-07-01 DIAGNOSIS — I10 Essential (primary) hypertension: Secondary | ICD-10-CM | POA: Diagnosis present

## 2019-07-01 DIAGNOSIS — Z20828 Contact with and (suspected) exposure to other viral communicable diseases: Secondary | ICD-10-CM | POA: Insufficient documentation

## 2019-07-01 DIAGNOSIS — Z955 Presence of coronary angioplasty implant and graft: Secondary | ICD-10-CM | POA: Insufficient documentation

## 2019-07-01 DIAGNOSIS — I252 Old myocardial infarction: Secondary | ICD-10-CM | POA: Insufficient documentation

## 2019-07-01 DIAGNOSIS — Z9861 Coronary angioplasty status: Secondary | ICD-10-CM

## 2019-07-01 DIAGNOSIS — R7989 Other specified abnormal findings of blood chemistry: Secondary | ICD-10-CM

## 2019-07-01 DIAGNOSIS — R778 Other specified abnormalities of plasma proteins: Secondary | ICD-10-CM

## 2019-07-01 DIAGNOSIS — Z5111 Encounter for antineoplastic chemotherapy: Secondary | ICD-10-CM

## 2019-07-01 LAB — CBC
HCT: 34.9 % — ABNORMAL LOW (ref 39.0–52.0)
Hemoglobin: 11.4 g/dL — ABNORMAL LOW (ref 13.0–17.0)
MCH: 31.3 pg (ref 26.0–34.0)
MCHC: 32.7 g/dL (ref 30.0–36.0)
MCV: 95.9 fL (ref 80.0–100.0)
Platelets: 122 10*3/uL — ABNORMAL LOW (ref 150–400)
RBC: 3.64 MIL/uL — ABNORMAL LOW (ref 4.22–5.81)
RDW: 13 % (ref 11.5–15.5)
WBC: 3.9 10*3/uL — ABNORMAL LOW (ref 4.0–10.5)
nRBC: 0 % (ref 0.0–0.2)

## 2019-07-01 LAB — BASIC METABOLIC PANEL
Anion gap: 11 (ref 5–15)
BUN: 19 mg/dL (ref 8–23)
CO2: 23 mmol/L (ref 22–32)
Calcium: 8.6 mg/dL — ABNORMAL LOW (ref 8.9–10.3)
Chloride: 102 mmol/L (ref 98–111)
Creatinine, Ser: 0.96 mg/dL (ref 0.61–1.24)
GFR calc Af Amer: >60 mL/min (ref >60)
GFR calc non Af Amer: >60 mL/min (ref >60)
Glucose, Bld: 108 mg/dL — ABNORMAL HIGH (ref 70–99)
Potassium: 3.4 mmol/L — ABNORMAL LOW (ref 3.5–5.1)
Sodium: 136 mmol/L (ref 135–145)

## 2019-07-01 LAB — TROPONIN I (HIGH SENSITIVITY)
Troponin I (High Sensitivity): 19 ng/L — ABNORMAL HIGH (ref <18)
Troponin I (High Sensitivity): 120 ng/L (ref <18)

## 2019-07-01 LAB — MAGNESIUM: Magnesium: 1.8 mg/dL (ref 1.7–2.4)

## 2019-07-01 LAB — TSH: TSH: 1.083 u[IU]/mL (ref 0.350–4.500)

## 2019-07-01 MED ORDER — IOHEXOL 350 MG/ML SOLN
75.0000 mL | Freq: Once | INTRAVENOUS | Status: AC | PRN
  Administered 2019-07-01: 75 mL via INTRAVENOUS

## 2019-07-01 MED ORDER — SODIUM CHLORIDE 0.9 % IV BOLUS
500.0000 mL | Freq: Once | INTRAVENOUS | Status: AC
  Administered 2019-07-01: 500 mL via INTRAVENOUS

## 2019-07-01 MED ORDER — MAGNESIUM SULFATE 2 GM/50ML IV SOLN
2.0000 g | Freq: Once | INTRAVENOUS | Status: AC
  Administered 2019-07-01: 2 g via INTRAVENOUS
  Filled 2019-07-01: qty 50

## 2019-07-01 MED ORDER — POTASSIUM CHLORIDE CRYS ER 20 MEQ PO TBCR
40.0000 meq | EXTENDED_RELEASE_TABLET | Freq: Once | ORAL | Status: AC
  Administered 2019-07-01: 40 meq via ORAL
  Filled 2019-07-01: qty 2

## 2019-07-01 MED ORDER — SODIUM CHLORIDE 0.9% FLUSH: 3.0000 mL | Freq: Once | INTRAVENOUS | Status: DC

## 2019-07-01 NOTE — Progress Notes (Signed)
Mr. Alan [name] was receiving chemotherapy with carboplatin and paclitaxel today.  He had been given Benadryl as part of his premedications after which time he developed restless leg syndrome.  He was given Ativan 0.5 mg IV x1 with improvement in his symptoms.  He was able to complete his treatment today without any further issues of concern.  Sandi Mealy, MHS, PA-C Physician Assistant

## 2019-07-01 NOTE — Consult Note (Signed)
CARDIOLOGY CONSULT NOTE   Referring Physician: Dr. Gilford Raid Primary Physician: Dr [name] Primary Cardiologist: Dr Glenetta Hew Reason for Consultation: SVT   HPI: Alan [name] is a 63 y.o. male w/ history of CAD (LAD STEMI s/p PCI in 2018, MV repair, Stage 3 lung cancer currently receiving radiation and chromotherapy who presents with presyncope.   Patient has been undergoing daily radiation therapy for his active Stage 3 lung cancer. Today after his treatment he was at home feeling fine. Suddenly he felt short of breath and dizzy like he was going to pass out. Has never felt like this before. He denies any chest pain or chest pressure at the time. Notably his symptoms during this episode were nothing like the symptoms he developed at the time of his MI from several years ago (severe chest pain/pressure that radiated into both arms). He checked his blood pressure and found it to be 60/40 with a heart rate of 195bpm. His symptoms persisted and EMS was called. He was found to be in a rapid narrow complex tachycardia. He was given 6mg  IV adenosine x 1 and his symptoms immediately resolved. He immediately felt back to normal. He clearly denies having any chest pain or pressure before, during, or after this episode. He was brought to the ED for evaluation.   In the ED the patient was found to be in normal sinus rhythm with no evidence of transient arrhythmia. His initial troponin was 19 and a subsequent troponin 2 hours latera was 120. He was admitted for observation and cardiology was consulted.   The patient is followed by Dr Ellyn Hack in cardiology. He states he has been off of all of his cardiac medications for quite a while because "he doesn't need them anymore." He has had no issues with exertional chest pain/pressure, lightheadedness, dizziness, syncope, presyncope. He does describe some chronic exertional dyspnea which he attributes to his lung cancer. His symptoms have not changed in  frequency or severity.   Review of Systems:     Cardiac Review of Systems: {Y] = yes [ ]  = no  Chest Pain [    ]  Resting SOB [ X ] Exertional SOB  [  ]  Orthopnea [  ]   Pedal Edema [   ]    Palpitations [ X ] Syncope  [  ]   Presyncope [   ]  General Review of Systems: [Y] = yes [  ]=no Constitional: recent weight change [  ]; anorexia [  ]; fatigue [  ]; nausea [  ]; night sweats [  ]; fever [  ]; or chills [  ];                                                                     Eyes : blurred vision [  ]; diplopia [   ]; vision changes [  ];  Amaurosis fugax[  ]; Resp: cough [  ];  wheezing[  ];  hemoptysis[  ];  PND [  ];  GI:  gallstones[  ], vomiting[  ];  dysphagia[  ]; melena[  ];  hematochezia [  ]; heartburn[  ];   GU: kidney stones [  ]; hematuria[  ];  dysuria [  ];  nocturia[  ]; incontinence [  ];             Skin: rash, swelling[  ];, hair loss[  ];  peripheral edema[  ];  or itching[  ]; Musculosketetal: myalgias[  ];  joint swelling[  ];  joint erythema[  ];  joint pain[  ];  back pain[  ];  Heme/Lymph: bruising[  ];  bleeding[  ];  anemia[  ];  Neuro: TIA[  ];  headaches[  ];  stroke[  ];  vertigo[  ];  seizures[  ];   paresthesias[  ];  difficulty walking[  ];  Psych:depression[  ]; anxiety[  ];  Endocrine: diabetes[  ];  thyroid dysfunction[  ];  Other:  Past Medical History:  Diagnosis Date   CAD S/P percutaneous coronary angioplasty 2004   which showed essentially normal LV size and function, moderately dilated left atrium, moderate mitral prolapse with mild to moderate regurgitation.   Dyslipidemia, goal LDL below 70    Essential hypertension    GERD (gastroesophageal reflux disease)    History of Mitral valve prolapse    Moderate - with moderate MR, noted February t 2013   History of Severe mitral regurgitation by prior echocardiogram 02/06/2014   TEE: Severe mitral regurgitation with a flail P2 segment and ruptured; Normal LV size & function, dilated  LA.   History of stress test 12/2009   he walked 9 mins reaching 10 METS. There was an attenuation artifact in the inferior region but no ischemia or infaract, low risk.   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    (Not in a hospital admission)     sodium chloride flush  3 mL Intravenous Once    Infusions:  sodium chloride      Allergies  Allergen Reactions   A-Cillin [Ampicillin] Shortness Of Breath, Swelling and Rash    Did it involve swelling of the face/tongue/throat, SOB, or low BP? Yes Did it involve sudden or severe rash/hives, skin peeling, or any reaction on the inside of your mouth or nose? Yes Did you need to seek medical attention at a hospital or doctor's office? Yes When did it last happen?~20 years ago If all above answers are "NO", may proceed with cephalosporin use.     Amoxicillin Hives, Shortness Of Breath and Swelling    Did it involve swelling of the face/tongue/throat, SOB, or low BP? Yes Did it involve sudden or severe rash/hives, skin peeling, or any reaction on the inside of your mouth or nose? Yes Did you need to seek medical attention at a hospital or doctor's office? Yes When did it last happen?~20 years ago If all above answers are "NO", may proceed with cephalosporin use.    Other Hives, Shortness Of Breath and Swelling     ALL "CILLINS"   Penicillins Hives, Shortness Of Breath and Swelling  Did it involve swelling of the face/tongue/throat, SOB, or low BP? Yes Did it involve sudden or severe rash/hives, skin peeling, or any reaction on the inside of your mouth or nose? Yes Did you need to seek medical attention at a hospital or doctor's office? Yes When did it last happen?~20 years ago If all above answers are "NO", may proceed with cephalosporin use.      Social History   Socioeconomic History   Marital status: Married    Spouse name: Not on file   Number of children: Not on file   Years of education: Not on file   Highest education level: Not on file  Occupational History   Not on file  Social Needs   Financial resource strain: Not on file   Food insecurity    Worry: Not on file    Inability: Not on file   Transportation needs    Medical: Not on file    Non-medical: Not on file  Tobacco Use   Smoking status: Never Smoker   Smokeless tobacco: Never Used  Substance and Sexual Activity   Alcohol use: Yes    Alcohol/week: 10.0 standard drinks    Types: 5 Cans of beer, 5 Shots of liquor per week    Comment: social   Drug use: No   Sexual activity: Not on file  Lifestyle   Physical activity    Days per week: Not on file    Minutes per session: Not on file   Stress: Not on file  Relationships   Social connections    Talks on phone: Not on file    Gets together: Not on file    Attends religious service: Not on file    Active member of club or organization: Not on file    Attends meetings of clubs or organizations: Not on file    Relationship status: Not on file   Intimate partner violence    Fear of current or ex partner: Not on file    Emotionally abused: Not on file    Physically abused: Not on file    Forced sexual activity: Not on file  Other Topics Concern   Not on file  Social History Narrative   He is a married father of one. Exercises avidly as noted  above - runs routinely at least 3 miles 3-4 times a day.  He drinks his Xango fruit juce - 32 Oz. Daily.     Never smoked and only takes occasional alcohol    Family History  Problem Relation Age of Onset   Hypertension Mother    Heart Problems Father        triple bypass 1989   Cancer Paternal Grandmother        Pancreatic    PHYSICAL EXAM: Vitals:   07/01/19 1945 07/01/19 2130  BP: 101/72 101/70  Pulse: 98 100  Resp: 16 (!) 22  SpO2: 97% 96%    No intake or output data in the 24 hours ending 07/01/19 2200  General:  Well appearing. No respiratory difficulty HEENT: normal Neck: supple. no JVD. Carotids 2+ bilat; no bruits. No lymphadenopathy or thryomegaly appreciated. Cor: PMI nondisplaced. Regular rate & rhythm. No rubs, gallops or murmurs. Lungs: clear Abdomen: soft, nontender, nondistended. No hepatosplenomegaly. No bruits or masses. Good bowel sounds. Extremities: no cyanosis, clubbing, rash, edema Neuro: alert & oriented x 3, cranial nerves grossly intact. moves all 4 extremities w/o difficulty. Affect pleasant.  ECG: NSR, HR 98 bpm, normal axis, normal  intervals, LAE, no Q waves, normal ST and T waves, no signs of acute ischemia  Results for orders placed or performed during the hospital encounter of 07/01/19 (from the past 24 hour(s))  Basic metabolic panel     Status: Abnormal   Collection Time: 07/01/19  5:23 PM  Result Value Ref Range   Sodium 136 135 - 145 mmol/L   Potassium 3.4 (L) 3.5 - 5.1 mmol/L   Chloride 102 98 - 111 mmol/L   CO2 23 22 - 32 mmol/L   Glucose, Bld 108 (H) 70 - 99 mg/dL   BUN 19 8 - 23 mg/dL   Creatinine, Ser 0.96 0.61 - 1.24 mg/dL   Calcium 8.6 (L) 8.9 - 10.3 mg/dL   GFR calc non Af Amer >60 >60 mL/min   GFR calc Af Amer >60 >60 mL/min   Anion gap 11 5 - 15  CBC     Status: Abnormal   Collection Time: 07/01/19  5:23 PM  Result Value Ref Range   WBC 3.9 (L) 4.0 - 10.5 K/uL   RBC 3.64 (L) 4.22 - 5.81 MIL/uL   Hemoglobin 11.4  (L) 13.0 - 17.0 g/dL   HCT 34.9 (L) 39.0 - 52.0 %   MCV 95.9 80.0 - 100.0 fL   MCH 31.3 26.0 - 34.0 pg   MCHC 32.7 30.0 - 36.0 g/dL   RDW 13.0 11.5 - 15.5 %   Platelets 122 (L) 150 - 400 K/uL   nRBC 0.0 0.0 - 0.2 %  Troponin I (High Sensitivity)     Status: Abnormal   Collection Time: 07/01/19  5:37 PM  Result Value Ref Range   Troponin I (High Sensitivity) 19 (H) <18 ng/L  Magnesium     Status: None   Collection Time: 07/01/19  5:37 PM  Result Value Ref Range   Magnesium 1.8 1.7 - 2.4 mg/dL  TSH     Status: None   Collection Time: 07/01/19  5:38 PM  Result Value Ref Range   TSH 1.083 0.350 - 4.500 uIU/mL  Troponin I (High Sensitivity)     Status: Abnormal   Collection Time: 07/01/19  7:52 PM  Result Value Ref Range   Troponin I (High Sensitivity) 120 (HH) <18 ng/L   Dg Chest 2 View  Result Date: 07/01/2019 CLINICAL DATA:  Tachycardia EXAM: CHEST - 2 VIEW COMPARISON:  05/16/2019, 04/18/2019, 07/26/2018, CT 04/30/2019 FINDINGS: Left lung is clear. No significant change in the appearance of right hilar opacity and vague right lower lobe opacity. Normal heart size. No pneumothorax. IMPRESSION: No acute interval change compared to prior radiograph from October. Known right hilar mass and lower lobe mass are better seen on CT. Similar appearance of streaky right lower lobe opacities. Electronically Signed   By: Donavan Foil M.D.   On: 07/01/2019 18:20     ASSESSMENT: Alan [name] is a 63 y.o. male w/ history of CAD (LAD STEMI s/p PCI in 2018, MV repair, Stage 3 lung cancer currently receiving radiation and chromotherapy who presents with presyncope during an episode of SVT.   I have reviewed the telemetry strip from EMS in the ED. 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 It is narrow complex, fast (around 190bpm), and regular. After cardioversion he has several PAC's. He fortunately responded quite well to 1 dose of IV adenosine, again supporting a diagnosis of AVNRT. His SVT  is most likely caused by irritation from is lung cancer and radiation therapy in the setting of pre-existing cardiac  disease. He is currently on no AV nodal blocking agents. It would be very reasonable to introduce a low dose beta blocker to attempt to prevent these episodes in the future. Would also recommend giving the patient instructions on vagal maneuvers that can be performed at home.   Regarding the elevated troponin, this is most likely secondary to his tachyarrhythmia rather than from an acute coronary syndrome. The patient is very clear about the fact that he did not have any of the same symptoms of anginal chest pain today that he had during his MI several years ago. His ECG is now normal and he is asymptomatic. For this reason I do no think he warrants IV heparin therapy, but would recommend restarting aspirin for secondary prevention. The patient is in agreement with this plan.  PLAN/DISCUSSION: - observation overnight with likely plan for d/c in AM - no need for IV heparin at this time unless patient develops chest pain/pressure, SOB, or new symptoms consistent with ischemia - start aspirin 81mg  daily - start metoprolol succinate 25mg  daily - continue telemetry - patient to contact radiation oncologist to determine if he will still be receiving his treatment tomorrow afternoon - will cc Dr. Ellyn Hack on this note to help schedule patient for cardiology follow up (currently patient does not have cardiology follow up in place)  Marcie Mowers, MD Cardiology Fellow, PGY-7

## 2019-07-01 NOTE — ED Notes (Signed)
Patient transported to CT 

## 2019-07-01 NOTE — H&P (Signed)
History and Physical    Alan [name] VEL:[id number] DOB: 10-28-1955 DOA: 07/01/2019  PCP: [name], L.[name], MD    Patient coming from: Home    Chief Complaint:  Palpitation, shortness of breath, lightheadedness  HPI: Alan [name] is a 63 y.o. male with medical history significant of coronary artery disease status post angioplasty, stents, essential hypertension, dyslipidemia, GERD, mitral valve plasty for mitral prolapse, lung CA, came with a chief complaint of palpitation lightheadedness mild shortness of breath.  He was not feeling good this morning.  Had session of radiation today.  Went home and became dizzy lightheaded and felt palpitation.  Call paramedics found him in SVT converted to sinus with 6 mg of adenosine.  ED Course:  Patient stable in the emergency room no more arrhythmias Potassium 3.4 magnesium 1.8 Elevated troponin ED physician discussed case with cardiology keep him in observation  Review of Systems: As per HPI otherwise 10 point review of systems negative.  Palpitation weakness lightheadedness  Past Medical History:  Diagnosis Date  . CAD S/P percutaneous coronary angioplasty 2004   which showed essentially normal LV size and function, moderately dilated left atrium, moderate mitral prolapse with mild to moderate regurgitation.  . Dyslipidemia, goal LDL below 70   . Essential hypertension   . GERD (gastroesophageal reflux disease)   . History of Mitral valve prolapse    Moderate - with moderate MR, noted February t 2013  . History of Severe mitral regurgitation by prior echocardiogram 02/06/2014   TEE: Severe mitral regurgitation with a flail P2 segment and ruptured; Normal LV size & function, dilated LA.  Marland Kitchen History of stress test 12/2009   he walked 9 mins reaching 10 METS. There was an attenuation artifact in the inferior region but no ischemia or infaract, low risk.  . Incidental lung nodule, greater than or equal to 29mm 03/16/2014   Ground glass  opacity RML noted on CT scan  . PONV (postoperative nausea and vomiting)   . 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 ST elevation myocardial infarction (STEMI) of anterior wall, subsequent episode of care [facility]) 2004   he had a proximal LAD occlusion treated with 2 overlapping 3.5 x 1.8 mm Cypher DES stents.    Past Surgical History:  Procedure Laterality Date  . ANTERIOR CRUCIATE LIGAMENT REPAIR Left 1993  . CARDIAC CATHETERIZATION  2004   he had a proximal LAD occlusion treated with 2 overlapping 3.5 x 1.8 mm Cypher DES stents.  Marland Kitchen CARDIAC CATHETERIZATION  2007; 03/2014   Widely patent LAD stents, 40% distal LAD, 30% Cx, ~50% PL-PDA bifurcation lesion   . COLONOSCOPY    . INTRAOPERATIVE TRANSESOPHAGEAL ECHOCARDIOGRAM N/A 04/08/2014   Procedure: INTRAOPERATIVE TRANSESOPHAGEAL ECHOCARDIOGRAM;  Surgeon: Rexene Alberts, MD;  Location: [place];  Service: Open Heart Surgery;  Laterality: N/A;  . LEFT AND RIGHT HEART CATHETERIZATION WITH CORONARY ANGIOGRAM N/A 03/05/2014   Procedure: LEFT AND RIGHT HEART CATHETERIZATION WITH CORONARY ANGIOGRAM;  Surgeon: Leonie Man, MD;  Location: [place] CATH LAB;  Service: Cardiovascular;  Laterality: N/A;  . LEFT HEART CATHETERIZATION WITH CORONARY ANGIOGRAM N/A 09/15/2011   Procedure: LEFT HEART CATHETERIZATION WITH CORONARY ANGIOGRAM;  Surgeon: Lorretta Harp, MD;  Location: [facility] CATH LAB;  Service: Cardiovascular;  Laterality: N/A;  .  MITRAL VALVE REPAIR Right 04/08/2014   Procedure: MINIMALLY INVASIVE MITRAL VALVE REPAIR (MVR);  Surgeon: Rexene Alberts, MD;  Location: [place];  Service: Open Heart Surgery;  Laterality: Right;  . NM MYOVIEW LTD  May 2011   Walk 9 minutes, and 10 METs,  diaphragmatic attenuation but no ischemia or infarction.  . TEE WITHOUT CARDIOVERSION N/A 02/06/2014   Procedure: TRANSESOPHAGEAL ECHOCARDIOGRAM (TEE);  Surgeon: Pixie Casino, MD;  Normal LV Size U& function - EF 55-60%, no regional WMA.  MV P2 Leaflet is flail with ruptured chord with severe prolapse, anterior leaflet intact.  Severe, eccentric anterior directed MR with dilated LA.  Marland Kitchen TRANSTHORACIC ECHOCARDIOGRAM  07/18/2018   Normal LV size and function.  EF 50-60 %.  Unable to assess diastolic function.  Mitral valve sewing ring in place.  Mild stenosis with a gradient of 6 mmHg noted. -  Domingo Dimes ECHOCARDIOGRAM  June 30 2015   Low normal LV function with EF 50-55%. GR 1 DD. No MR. Normal gradient of 4 mmHg the mitral valve. No prolapse.  Marland Kitchen VIDEO BRONCHOSCOPY WITH ENDOBRONCHIAL ULTRASOUND N/A 05/16/2019   Procedure: VIDEO BRONCHOSCOPY WITH ENDOBRONCHIAL ULTRASOUND;  Surgeon: Marshell Garfinkel, MD;  Location: [place];  Service: Pulmonary;  Laterality: N/A;     reports that he has never smoked. He has never used smokeless tobacco. He reports current alcohol use of about 10.0 standard drinks of alcohol per week. He reports that he does not use drugs.  Allergies  Allergen Reactions  . A-Cillin [Ampicillin] Shortness Of Breath, Swelling and Rash    Did it involve swelling of the face/tongue/throat, SOB, or low BP? Yes Did it involve sudden or severe rash/hives, skin peeling, or any reaction on the inside of your mouth or nose? Yes Did you need to seek medical attention at a hospital or doctor's office? Yes When did it last happen?~20 years ago If all above answers are "NO", may proceed with cephalosporin use.    Marland Kitchen Amoxicillin Hives, Shortness Of Breath and Swelling    Did it involve swelling of the face/tongue/throat, SOB, or low BP? Yes Did it involve sudden or severe rash/hives, skin peeling, or any reaction on the inside of your mouth or nose? Yes Did you need to seek medical  attention at a hospital or doctor's office? Yes When did it last happen?~20 years ago If all above answers are "NO", may proceed with cephalosporin use.   . Other Hives, Shortness Of Breath and Swelling    ALL "CILLINS"  . Penicillins Hives, Shortness Of Breath and Swelling    Did it involve swelling of the face/tongue/throat, SOB, or low BP? Yes Did it involve sudden or severe rash/hives, skin peeling, or any reaction on the inside of your mouth or nose? Yes Did you need to seek medical attention at a hospital or doctor's office? Yes When did it last happen?~20 years ago If all above answers are "NO", may proceed with cephalosporin use.      Family History  Problem Relation Age of Onset  . Hypertension Mother   . Heart Problems Father        triple bypass 1989  . Cancer Paternal Grandmother        Pancreatic     Prior to Admission medications   Medication Sig Start Date End Date Taking? Authorizing Provider  Ascorbic Acid (VITAMIN C) 1000 MG tablet Take 2,000 mg by mouth daily.    Yes [provider]  Coenzyme Q10 (CO Q  10) 100 MG CAPS Take 100 mg by mouth daily.    Yes [provider]  Probiotic Product (PROBIOTIC DAILY PO) Take 1 capsule by mouth daily.    Yes [provider]  prochlorperazine (COMPAZINE) 10 MG tablet Take 1 tablet (10 mg total) by mouth every 6 (six) hours as needed for nausea or vomiting. 05/29/19  Yes Curt Bears, MD  Specialty Vitamins Products (PROSTATE PO) Take 1 capsule by mouth daily. PROSTATE FORMULA   Yes [provider]  sucralfate (CARAFATE) 1 g tablet Take 1 tablet (1 g total) by mouth 4 (four) times daily -  with meals and at bedtime. crush and dissolve in 10 cc of water prior to consuming 06/24/19  Yes Gery Pray, MD  Vitamin D-Vitamin K (D3 + K2 DOTS PO) Take 1 tablet by mouth daily.   Yes [provider]  zolpidem (AMBIEN) 5 MG tablet TAKE 1 TABLET BY MOUTH EVERY NIGHT AT BEDTIME AS NEEDED  FOR SLEEP Patient taking differently: Take 5 mg by mouth at bedtime as needed for sleep.  05/13/19  Yes Leonie Man, MD    Physical Exam: Vitals:   07/01/19 1723 07/01/19 1830 07/01/19 1945 07/01/19 2130  BP:  102/80 101/72 101/70  Pulse: 100 93 98 100  Resp: 20 17 16  (!) 22  SpO2: 98% 100% 97% 96%    Constitutional: NAD, calm, comfortable Vitals:   07/01/19 1723 07/01/19 1830 07/01/19 1945 07/01/19 2130  BP:  102/80 101/72 101/70  Pulse: 100 93 98 100  Resp: 20 17 16  (!) 22  SpO2: 98% 100% 97% 96%   Eyes: PERRL, lids and conjunctivae normal ENMT: Mucous membranes are moist. Posterior pharynx clear of any exudate or lesions.Normal dentition.  Neck: normal, supple, no masses, no thyromegaly Respiratory: clear to auscultation bilaterally, no wheezing, no crackles. Normal respiratory effort. No accessory muscle use.  Cardiovascular: Regular rate and rhythm, no murmurs / rubs / gallops. No extremity edema. 2+ pedal pulses. No carotid bruits.  Abdomen: no tenderness, no masses palpated. No hepatosplenomegaly. Bowel sounds positive.  Musculoskeletal: no clubbing / cyanosis. No joint deformity upper and lower extremities. Good ROM, no contractures. Normal muscle tone.  Skin: no rashes, lesions, ulcers. No induration Neurologic: CN 2-12 grossly intact. Sensation intact, DTR normal. Strength 5/5 in all 4.  Psychiatric: Normal judgment and insight. Alert and oriented x 3. Normal mood.    Labs on Admission: I have personally reviewed following labs and imaging studies  CBC: Recent Labs  Lab 06/30/19 0950 07/01/19 1723  WBC 3.0* 3.9*  NEUTROABS 2.3  --   HGB 12.5* 11.4*  HCT 37.3* 34.9*  MCV 92.6 95.9  PLT 164 408*   Basic Metabolic Panel: Recent Labs  Lab 06/30/19 0950 07/01/19 1723 07/01/19 1737  NA 142 136  --   K 4.1 3.4*  --   CL 107 102  --   CO2 24 23  --   GLUCOSE 90 108*  --   BUN 13 19  --   CREATININE 0.72 0.96  --   CALCIUM 9.0 8.6*  --   MG  --   --   1.8   GFR: Estimated Creatinine Clearance: 94.3 mL/min (by C-G formula based on SCr of 0.96 mg/dL). Liver Function Tests: Recent Labs  Lab 06/30/19 0950  AST 17  ALT 18  ALKPHOS 85  BILITOT 0.7  PROT 6.5  ALBUMIN 3.9   No results for input(s): LIPASE, AMYLASE in the last 168 hours. No results  for input(s): AMMONIA in the last 168 hours. Coagulation Profile: No results for input(s): INR, PROTIME in the last 168 hours. Cardiac Enzymes: No results for input(s): CKTOTAL, CKMB, CKMBINDEX, TROPONINI in the last 168 hours. BNP (last 3 results) No results for input(s): PROBNP in the last 8760 hours. HbA1C: No results for input(s): HGBA1C in the last 72 hours. CBG: No results for input(s): GLUCAP in the last 168 hours. Lipid Profile: No results for input(s): CHOL, HDL, LDLCALC, TRIG, CHOLHDL, LDLDIRECT in the last 72 hours. Thyroid Function Tests: Recent Labs    07/01/19 1738  TSH 1.083   Anemia Panel: No results for input(s): VITAMINB12, FOLATE, FERRITIN, TIBC, IRON, RETICCTPCT in the last 72 hours. Urine analysis:    Component Value Date/Time   COLORURINE YELLOW 04/06/2014 1319   APPEARANCEUR HAZY (A) 04/06/2014 1319   LABSPEC 1.018 04/06/2014 1319   PHURINE 7.5 04/06/2014 1319   GLUCOSEU NEGATIVE 04/06/2014 1319   HGBUR NEGATIVE 04/06/2014 1319   BILIRUBINUR NEGATIVE 04/06/2014 1319   KETONESUR NEGATIVE 04/06/2014 1319   PROTEINUR NEGATIVE 04/06/2014 1319   UROBILINOGEN 0.2 04/06/2014 1319   NITRITE NEGATIVE 04/06/2014 1319   LEUKOCYTESUR NEGATIVE 04/06/2014 1319    Radiological Exams on Admission: Dg Chest 2 View  Result Date: 07/01/2019 CLINICAL DATA:  Tachycardia EXAM: CHEST - 2 VIEW COMPARISON:  05/16/2019, 04/18/2019, 07/26/2018, CT 04/30/2019 FINDINGS: Left lung is clear. No significant change in the appearance of right hilar opacity and vague right lower lobe opacity. Normal heart size. No pneumothorax. IMPRESSION: No acute interval change compared to prior  radiograph from October. Known right hilar mass and lower lobe mass are better seen on CT. Similar appearance of streaky right lower lobe opacities. Electronically Signed   By: Donavan Foil M.D.   On: 07/01/2019 18:20   Ct Angio Chest Pe W And/or Wo Contrast  Result Date: 07/01/2019 CLINICAL DATA:  Dizziness, shortness of breath. History of lung cancer and radiation treatment EXAM: CT ANGIOGRAPHY CHEST WITH CONTRAST TECHNIQUE: Multidetector CT imaging of the chest was performed using the standard protocol during bolus administration of intravenous contrast. Multiplanar CT image reconstructions and MIPs were obtained to evaluate the vascular anatomy. CONTRAST:  39mL OMNIPAQUE IOHEXOL 350 MG/ML SOLN COMPARISON:  04/30/2019 FINDINGS: Cardiovascular: No filling defects in the pulmonary arteries to suggestpulmonary emboli. Heart is normal size. Aorta is normal caliber. Coronary artery and aortic calcifications. Mediastinum/Nodes: Enlarged anterior mediastinal/prevascular lymph node measuring up to 15 mm compared to 11 mm previously. Right hilar/infrahilar mass encases the right lower lobe pulmonary artery severely narrowing it. This is stable since prior study. This narrows the right middle lobe bronchus. Lungs/Pleura: Right hilar/infrahilar mass again noted as seen on prior study, stable. Areas of atelectasis in the right middle lobe, likely postobstructive. Peripheral opacities in the right lower lobe also again noted, stable, likely atelectasis. No effusions. Left lung clear. Upper Abdomen: Imaging into the upper abdomen shows no acute findings. Musculoskeletal: Chest wall soft tissues are unremarkable. No acute bony abnormality. Review of the MIP images confirms the above findings. IMPRESSION: No evidence of pulmonary embolus. Coronary artery disease. Stable right hilar/infrahilar mass with narrowing of the right lower lobe pulmonary artery, right middle lobe bronchus and right bronchus intermedius. Areas of  atelectasis in the right middle lobe and lower lobe are unchanged. Overall appearance is stable. Mediastinal adenopathy. Prevascular lymph node measures slightly larger than prior study. Aortic Atherosclerosis (ICD10-I70.0). Electronically Signed   By: Rolm Baptise M.D.   On: 07/01/2019 22:38  EKG: Independently reviewed.  Normal sinus no acute ST-T changes  Assessment/Plan Principal Problem:   SVT (supraventricular tachycardia) (HCC) Active Problems:   CAD S/P percutaneous coronary angioplasty - DES PCI to occluded LAD during anterior STEMI   History of Severe Mitral valve prolapse - severe prolapse of the posterior (P2) leaflet with severe MR   Essential hypertension   Dyslipidemia, goal LDL below 70   S/P minimally invasive mitral valve repair   Lung mass   Adenocarcinoma of right lung, stage 3 ([place])   Encounter for antineoplastic chemotherapy   Elevated troponin level   SVT Patient had radiation today Feel weak in early morning In the afternoon palpitation and clammy Paramedics found him in SVT, converted to sinus after 6 mg of adenosine Troponin elevated CT of the chest no pulmonary embolism The emergency room physician discussed the case with cardiology Because patient has history of severe coronary artery disease with stent Keep him observation follow enzymes, observe for arrhythmia  Elevated troponin Serial troponins, cardiology consult  Hypokalemia, hypomagnesemia Add potassium magnesium Keep potassium more than 4 magnesium more than 2  Adenocarcinoma of right lung Under radiation and chemotherapy Plan follow-up with oncology  Coronary artery disease status post angioplasty Resume home medication  Essential hypertension Stable under good control  History of severe mitral valve prolapse Status post invasive mitral valve repair Plan follow-up cardiology  DVT prophylaxis:  Code Status:  Family Communication:  Disposition Plan:  Consults called:   Admission status:     Nitara Szczerba G Amous Crewe MD [facility]  If 7PM-7AM, please contact night-coverage [email or web address]   07/01/2019, 11:14 PM

## 2019-07-01 NOTE — ED Provider Notes (Addendum)
[facility] EMERGENCY DEPARTMENT Provider Note   CSN: [id number] Arrival date & time: 07/01/19  1716    History   Chief Complaint Converted SVT  HPI Alan [name] is a 63 y.o. male with past medical history significant for CAD, HTN, MVP who presents for evaluation of SVT. Patient with active treatment with chemoradiation for lung cancer.  Was treated today with radiation.  Patient states he did not even extra dose of Benadryl due to restless leg syndrome.  Patient states he was at home and felt dizzy, lightheaded and fell palpitations in the upper portion of his chest.  He has never felt like anything like this before.  He denies any chest pain.  Patient states he did feel short of breath when this began.  Patient states he took his blood pressure at home and his heart rate was in the 190s.  They called EMS.  Patient was given 1 dose of adenosine with conversion to normal sinus rhythm.  Patient states he does have prior history of MI however this feels "completely different."  He also has valve replacement for MVP however is not on anticoagulation.  Patient states when he did have his valve replacement surgery he did have a short run of A. fib however has not had this since.  He is followed with Dr. Ellyn Hack with cardiology. Patient denies any sudden onset thunderclap headache, unilateral weakness, slurred speech, hemoptysis, abdominal pain, diarrhea, dysuria, facial droop, unilateral weakness.  No chest pain radiating to left arm, back.  Denies any current symptoms.  Denies additional aggravating or allevaiting factors.   History obtained from patient, family and past medical records. No interpretor was used.     HPI  Past Medical History:  Diagnosis Date   CAD S/P percutaneous coronary angioplasty 2004   which showed essentially normal LV size and function, moderately dilated left atrium, moderate mitral prolapse with mild to moderate regurgitation.   Dyslipidemia,  goal LDL below 70    Essential hypertension    GERD (gastroesophageal reflux disease)    History of Mitral valve prolapse    Moderate - with moderate MR, noted February t 2013   History of Severe mitral regurgitation by prior echocardiogram 02/06/2014   TEE: Severe mitral regurgitation with a flail P2 segment and ruptured; Normal LV size & function, dilated LA.   History of stress test 12/2009   he walked 9 mins reaching 10 METS. There was an attenuation artifact in the inferior region but no ischemia or infaract, low risk.   Incidental lung nodule, greater than or equal to 37mm 03/16/2014   Ground glass opacity RML noted on CT scan   PONV (postoperative nausea and vomiting)    S/P Minimally Invasive MVR (mitral valve repair) 04/08/2014   Complex valvuloplasty including quadrangular resection of flail segment of posterior leaflet, sliding leaflet plasty, chordal transfer x1, Gore-tex neocord placement x4 and 34 mm Sorin Memo 3D rechord ring annuloplasty via right mini thoracotomy approach   ST elevation myocardial infarction (STEMI) of anterior wall, subsequent episode of care ([place]) 2004   he had a proximal LAD occlusion treated with 2 overlapping 3.5 x 1.8 mm Cypher DES stents.    Patient Active Problem List   Diagnosis Date Noted   SVT (supraventricular tachycardia) ([place]) 07/01/2019   Elevated troponin level 07/01/2019   Adenocarcinoma of right lung, stage 3 ([place]) 05/29/2019   Goals of care, counseling/discussion 05/29/2019   Encounter for antineoplastic chemotherapy 05/29/2019   Lung mass  S/P minimally invasive mitral valve repair 04/08/2014   Incidental lung nodule, greater than or equal to 48mm 03/16/2014   CAD S/P percutaneous coronary angioplasty - DES PCI to occluded LAD during anterior STEMI 11/29/2013   History of Severe mitral regurgitation 11/29/2013   History of Severe Mitral valve prolapse - severe prolapse of the posterior (P2) leaflet with severe MR  11/29/2013   Essential hypertension    Dyslipidemia, goal LDL below 70    METATARSALGIA 03/24/2009    Past Surgical History:  Procedure Laterality Date   ANTERIOR CRUCIATE LIGAMENT REPAIR Left 1993   CARDIAC CATHETERIZATION  2004   he had a proximal LAD occlusion treated with 2 overlapping 3.5 x 1.8 mm Cypher DES stents.   CARDIAC CATHETERIZATION  2007; 03/2014   Widely patent LAD stents, 40% distal LAD, 30% Cx, ~50% PL-PDA bifurcation lesion    COLONOSCOPY     INTRAOPERATIVE TRANSESOPHAGEAL ECHOCARDIOGRAM N/A 04/08/2014   Procedure: INTRAOPERATIVE TRANSESOPHAGEAL ECHOCARDIOGRAM;  Surgeon: Rexene Alberts, MD;  Location: [place];  Service: Open Heart Surgery;  Laterality: N/A;   LEFT AND RIGHT HEART CATHETERIZATION WITH CORONARY ANGIOGRAM N/A 03/05/2014   Procedure: LEFT AND RIGHT HEART CATHETERIZATION WITH CORONARY ANGIOGRAM;  Surgeon: Leonie Man, MD;  Location: [facility] CATH LAB;  Service: Cardiovascular;  Laterality: N/A;   LEFT HEART CATHETERIZATION WITH CORONARY ANGIOGRAM N/A 09/15/2011   Procedure: LEFT HEART CATHETERIZATION WITH CORONARY ANGIOGRAM;  Surgeon: Lorretta Harp, MD;  Location: [facility] CATH LAB;  Service: Cardiovascular;  Laterality: N/A;   MITRAL VALVE REPAIR Right 04/08/2014   Procedure: MINIMALLY INVASIVE MITRAL VALVE REPAIR (MVR);  Surgeon: Rexene Alberts, MD;  Location: [place];  Service: Open Heart Surgery;  Laterality: Right;   NM [facility]TD  May 2011   Walk 9 minutes, and 10 METs, diaphragmatic attenuation but no ischemia or infarction.   TEE WITHOUT CARDIOVERSION N/A 02/06/2014   Procedure: TRANSESOPHAGEAL ECHOCARDIOGRAM (TEE);  Surgeon: Pixie Casino, MD;  Normal LV Size U& function - EF 55-60%, no regional WMA.  MV P2 Leaflet is flail with ruptured chord with severe prolapse, anterior leaflet intact.  Severe, eccentric anterior directed MR with dilated LA.   TRANSTHORACIC ECHOCARDIOGRAM  07/18/2018   Normal LV size and function.  EF 50-60 %.  Unable to  assess diastolic function.  Mitral valve sewing ring in place.  Mild stenosis with a gradient of 6 mmHg noted. -   TRANSTHORACIC ECHOCARDIOGRAM  June 30 2015   Low normal LV function with EF 50-55%. GR 1 DD. No MR. Normal gradient of 4 mmHg the mitral valve. No prolapse.   VIDEO BRONCHOSCOPY WITH ENDOBRONCHIAL ULTRASOUND N/A 05/16/2019   Procedure: VIDEO BRONCHOSCOPY WITH ENDOBRONCHIAL ULTRASOUND;  Surgeon: Marshell Garfinkel, MD;  Location: [place];  Service: Pulmonary;  Laterality: N/A;        Home Medications    Prior to Admission medications   Medication Sig Start Date End Date Taking? Authorizing Provider  Ascorbic Acid (VITAMIN C) 1000 MG tablet Take 2,000 mg by mouth daily.    Yes [provider]  Coenzyme Q10 (CO Q 10) 100 MG CAPS Take 100 mg by mouth daily.    Yes [provider]  Probiotic Product (PROBIOTIC DAILY PO) Take 1 capsule by mouth daily.    Yes [provider]  prochlorperazine (COMPAZINE) 10 MG tablet Take 1 tablet (10 mg total) by mouth every 6 (six) hours as needed for nausea or vomiting. 05/29/19  Yes Curt Bears, MD  Specialty Vitamins  Products (PROSTATE PO) Take 1 capsule by mouth daily. PROSTATE FORMULA   Yes [provider]  sucralfate (CARAFATE) 1 g tablet Take 1 tablet (1 g total) by mouth 4 (four) times daily -  with meals and at bedtime. crush and dissolve in 10 cc of water prior to consuming 06/24/19  Yes Gery Pray, MD  Vitamin D-Vitamin K (D3 + K2 DOTS PO) Take 1 tablet by mouth daily.   Yes [provider]  zolpidem (AMBIEN) 5 MG tablet TAKE 1 TABLET BY MOUTH EVERY NIGHT AT BEDTIME AS NEEDED FOR SLEEP Patient taking differently: Take 5 mg by mouth at bedtime as needed for sleep.  05/13/19  Yes Leonie Man, MD    Family History Family History  Problem Relation Age of Onset   Hypertension Mother    Heart Problems Father        triple bypass 1989   Cancer Paternal Grandmother         Pancreatic    Social History Social History   Tobacco Use   Smoking status: Never Smoker   Smokeless tobacco: Never Used  Substance Use Topics   Alcohol use: Yes    Alcohol/week: 10.0 standard drinks    Types: 5 Cans of beer, 5 Shots of liquor per week    Comment: social   Drug use: No    Allergies   A-cillin [ampicillin], Amoxicillin, Other, and Penicillins   Review of Systems Review of Systems  Constitutional: Negative.   Respiratory: Negative for apnea, cough, choking, chest tightness, shortness of breath, wheezing and stridor.   Cardiovascular: Negative.   Gastrointestinal: Negative.   Musculoskeletal: Negative.   Skin: Negative.   Neurological: Positive for dizziness. Negative for seizures, syncope, facial asymmetry, speech difficulty, weakness, light-headedness, numbness and headaches.  All other systems reviewed and are negative.   Physical Exam Updated Vital Signs BP 101/70    Pulse 100    Resp (!) 22    SpO2 96%   Physical Exam Vitals signs and nursing note reviewed.  Constitutional:      General: He is not in acute distress.    Appearance: He is not ill-appearing, toxic-appearing or diaphoretic.  HENT:     Head: Normocephalic and atraumatic.     Jaw: There is normal jaw occlusion.     Nose: Nose normal.     Mouth/Throat:     Mouth: Mucous membranes are moist.     Pharynx: Oropharynx is clear.  Eyes:     Extraocular Movements: Extraocular movements intact.     Comments: No horizontal, vertical or rotational nystagmus   Neck:     Musculoskeletal: Full passive range of motion without pain, normal range of motion and neck supple.     Vascular: No carotid bruit or JVD.     Trachea: Trachea and phonation normal.     Meningeal: Brudzinski's sign and Kernig's sign absent.     Comments: Full active and passive ROM without pain No midline or paraspinal tenderness No nuchal rigidity or meningeal signs  Cardiovascular:     Rate and Rhythm: Normal rate.       Pulses: Normal pulses.          Radial pulses are 2+ on the right side and 2+ on the left side.       Posterior tibial pulses are 2+ on the right side and 2+ on the left side.     Heart sounds: Normal heart sounds.  Pulmonary:     Effort: Pulmonary  effort is normal.     Breath sounds: Normal breath sounds and air entry.  Chest:     Chest wall: No deformity, swelling, tenderness, crepitus or edema.  Musculoskeletal:     Right lower leg: No edema.     Left lower leg: No edema.  Feet:     Right foot:     Skin integrity: Skin integrity normal.     Left foot:     Skin integrity: Skin integrity normal.  Skin:    General: Skin is warm.     Capillary Refill: Capillary refill takes less than 2 seconds.  Neurological:     General: No focal deficit present.     Mental Status: He is alert and oriented to person, place, and time.     Comments: Mental Status:  Alert, oriented, thought content appropriate. Speech fluent without evidence of aphasia. Able to follow 2 step commands without difficulty.  Cranial Nerves:  II:  Peripheral visual fields grossly normal, pupils equal, round, reactive to light III,IV, VI: ptosis not present, extra-ocular motions intact bilaterally  V,VII: smile symmetric, facial light touch sensation equal VIII: hearing grossly normal bilaterally  IX,X: midline uvula rise  XI: bilateral shoulder shrug equal and strong XII: midline tongue extension  Motor:  5/5 in upper and lower extremities bilaterally including strong and equal grip strength and dorsiflexion/plantar flexion Sensory: Pinprick and light touch normal in all extremities.  Deep Tendon Reflexes: 2+ and symmetric  Cerebellar: normal finger-to-nose with bilateral upper extremities Gait: normal gait and balance CV: distal pulses palpable throughout      ED Treatments / Results  Labs (all labs ordered are listed, but only abnormal results are displayed) Labs Reviewed  BASIC METABOLIC PANEL -  Abnormal; Notable for the following components:      Result Value   Potassium 3.4 (*)    Glucose, Bld 108 (*)    Calcium 8.6 (*)    All other components within normal limits  CBC - Abnormal; Notable for the following components:   WBC 3.9 (*)    RBC 3.64 (*)    Hemoglobin 11.4 (*)    HCT 34.9 (*)    Platelets 122 (*)    All other components within normal limits  TROPONIN I (HIGH SENSITIVITY) - Abnormal; Notable for the following components:   Troponin I (High Sensitivity) 19 (*)    All other components within normal limits  TROPONIN I (HIGH SENSITIVITY) - Abnormal; Notable for the following components:   Troponin I (High Sensitivity) 120 (*)    All other components within normal limits  SARS CORONAVIRUS 2 (TAT 6-24 HRS)  MAGNESIUM  TSH  URINALYSIS, ROUTINE W REFLEX MICROSCOPIC  TROPONIN I (HIGH SENSITIVITY)    EKG EKG Interpretation  Date/Time:  Tuesday July 01 2019 21:17:01 EST Ventricular Rate:  98 PR Interval:    QRS Duration: 92 QT Interval:  362 QTC Calculation: 463 R Axis:   65 Text Interpretation: Sinus rhythm Probable left atrial enlargement Baseline wander in lead(s) V3 No significant change since last tracing Confirmed by Isla Pence [phone]) on 07/01/2019 9:19:35 PM   Radiology Dg Chest 2 View  Result Date: 07/01/2019 CLINICAL DATA:  Tachycardia EXAM: CHEST - 2 VIEW COMPARISON:  05/16/2019, 04/18/2019, 07/26/2018, CT 04/30/2019 FINDINGS: Left lung is clear. No significant change in the appearance of right hilar opacity and vague right lower lobe opacity. Normal heart size. No pneumothorax. IMPRESSION: No acute interval change compared to prior radiograph from October. Known right hilar mass  and lower lobe mass are better seen on CT. Similar appearance of streaky right lower lobe opacities. Electronically Signed   By: Donavan Foil M.D.   On: 07/01/2019 18:20   Ct Angio Chest Pe W And/or Wo Contrast  Result Date: 07/01/2019 CLINICAL DATA:  Dizziness,  shortness of breath. History of lung cancer and radiation treatment EXAM: CT ANGIOGRAPHY CHEST WITH CONTRAST TECHNIQUE: Multidetector CT imaging of the chest was performed using the standard protocol during bolus administration of intravenous contrast. Multiplanar CT image reconstructions and MIPs were obtained to evaluate the vascular anatomy. CONTRAST:  79mL OMNIPAQUE IOHEXOL 350 MG/ML SOLN COMPARISON:  04/30/2019 FINDINGS: Cardiovascular: No filling defects in the pulmonary arteries to suggestpulmonary emboli. Heart is normal size. Aorta is normal caliber. Coronary artery and aortic calcifications. Mediastinum/Nodes: Enlarged anterior mediastinal/prevascular lymph node measuring up to 15 mm compared to 11 mm previously. Right hilar/infrahilar mass encases the right lower lobe pulmonary artery severely narrowing it. This is stable since prior study. This narrows the right middle lobe bronchus. Lungs/Pleura: Right hilar/infrahilar mass again noted as seen on prior study, stable. Areas of atelectasis in the right middle lobe, likely postobstructive. Peripheral opacities in the right lower lobe also again noted, stable, likely atelectasis. No effusions. Left lung clear. Upper Abdomen: Imaging into the upper abdomen shows no acute findings. Musculoskeletal: Chest wall soft tissues are unremarkable. No acute bony abnormality. Review of the MIP images confirms the above findings. IMPRESSION: No evidence of pulmonary embolus. Coronary artery disease. Stable right hilar/infrahilar mass with narrowing of the right lower lobe pulmonary artery, right middle lobe bronchus and right bronchus intermedius. Areas of atelectasis in the right middle lobe and lower lobe are unchanged. Overall appearance is stable. Mediastinal adenopathy. Prevascular lymph node measures slightly larger than prior study. Aortic Atherosclerosis (ICD10-I70.0). Electronically Signed   By: Rolm Baptise M.D.   On: 07/01/2019 22:38    Procedures Procedures (including critical care time)  PCI to LAD in STEMI in 2015, MVR with ring by Dr. Roxy Manns  Medications Ordered in ED Medications  sodium chloride flush (NS) 0.9 % injection 3 mL (3 mLs Intravenous Not Given 07/01/19 1749)  magnesium sulfate IVPB 2 g 50 mL (2 g Intravenous New Bag/Given 07/01/19 2209)  sodium chloride 0.9 % bolus 500 mL (500 mLs Intravenous New Bag/Given 07/01/19 2209)  potassium chloride SA (KLOR-CON) CR tablet 40 mEq (40 mEq Oral Given 07/01/19 2209)  iohexol (OMNIPAQUE) 350 MG/ML injection 75 mL (75 mLs Intravenous Contrast Given 07/01/19 2216)   Initial Impression / Assessment and Plan / ED Course  I have reviewed the triage vital signs and the nursing notes.  Pertinent labs & imaging results that were available during my care of the patient were reviewed by me and considered in my medical decision making (see chart for details).  63 year old male appears otherwise well presents for evaluation of arrhythmia and presyncope event.  He is afebrile, nonseptic, non-ill-appearing.  Patient with sudden onset palpitations, dizziness following radiation therapy earlier today. Patient given Adenosine and converted in field by EMS. No current symptoms here in ED, he does have some mild tachycardia to 90-95 in room. No prior arrhythmias per patient. Plan for labs, Chest xray, EKG, cardiac monitoring. Followed by Dr. Ellyn Hack with Cardiology. No anticoagulation, beta blocker.  EKG without St/T changes CBC with leukopenia at 3.9 CMP with hypokalemia at 3.4 Mag 1.8 Trop from 19-->120 TSH 1.083  CONSULT with Cardiology fellow Dr. Emilio Aspen who recommends Obs and reevaluation in the morning given increasing  Troponin with significant CAD risk factors. Patient denies CP, SOB or current dizziness. Recommends medicine admission given current treatment for lung cancer. Does not recommend heparin currently.  Plan discussed with patient who is in agreement with  overnight observation to continue to monitor on telemetry  CONSULT with Dr.Cristescu with [facility] who will evaluate patient for admission. He would like a CTA chest to r/o PE. Patient denies CP, SOB and he does not appear to have a DVT on exam however patient does have significant risk factors given his known cancer diagnosis. Will order CTA chest.  2250: CTA chest negative for PE.  Patient without arrhythmia while here in the department.  Patient without history of congestive heart failure, normal hematocrit, normal ECG, no shortness of breath and systolic blood pressure greater than 90. CTA chest pending on admission.  The patient appears reasonably stabilized for admission considering the current resources, flow, and capabilities available in the ED at this time, and I doubt any other [facility] requiring further screening and/or treatment in the ED prior to admission.       Patient evaluated by Dr. Gilford Raid who is in agreement with above treatment, plan and disposition.  Final Clinical Impressions(s) / ED Diagnoses   Final diagnoses:  SVT (supraventricular tachycardia) ([place])  Elevated troponin    ED Discharge Orders    None       Ademide Schaberg A, PA-C 07/01/19 2203    Isla Pence, MD 07/01/19 2216    Heela Heishman A, PA-C 07/01/19 2250    Kassidy Frankson A, PA-C 07/01/19 2252    Isla Pence, MD 07/01/19 2311

## 2019-07-01 NOTE — ED Triage Notes (Signed)
Pt here ems from home with dizziness and SOB nausea and SVT. No hx of svt EMS gave 6mg  of adenosine. Lung cancer pt. Receives radiation and chemo.

## 2019-07-02 ENCOUNTER — Other Ambulatory Visit: Payer: Self-pay

## 2019-07-02 ENCOUNTER — Ambulatory Visit: Payer: Federal, State, Local not specified - PPO

## 2019-07-02 DIAGNOSIS — Z9861 Coronary angioplasty status: Secondary | ICD-10-CM

## 2019-07-02 DIAGNOSIS — I2510 Atherosclerotic heart disease of native coronary artery without angina pectoris: Secondary | ICD-10-CM

## 2019-07-02 DIAGNOSIS — I471 Supraventricular tachycardia: Secondary | ICD-10-CM

## 2019-07-02 LAB — BASIC METABOLIC PANEL
Anion gap: 10 (ref 5–15)
BUN: 10 mg/dL (ref 8–23)
CO2: 23 mmol/L (ref 22–32)
Calcium: 8.5 mg/dL — ABNORMAL LOW (ref 8.9–10.3)
Chloride: 107 mmol/L (ref 98–111)
Creatinine, Ser: 0.63 mg/dL (ref 0.61–1.24)
GFR calc Af Amer: >60 mL/min (ref >60)
GFR calc non Af Amer: >60 mL/min (ref >60)
Glucose, Bld: 97 mg/dL (ref 70–99)
Potassium: 4 mmol/L (ref 3.5–5.1)
Sodium: 140 mmol/L (ref 135–145)

## 2019-07-02 LAB — URINALYSIS, ROUTINE W REFLEX MICROSCOPIC
Bilirubin Urine: NEGATIVE
Glucose, UA: NEGATIVE mg/dL
Hgb urine dipstick: NEGATIVE
Ketones, ur: NEGATIVE mg/dL
Leukocytes,Ua: NEGATIVE
Nitrite: NEGATIVE
Protein, ur: NEGATIVE mg/dL
Specific Gravity, Urine: 1.031 — ABNORMAL HIGH (ref 1.005–1.030)
pH: 8 (ref 5.0–8.0)

## 2019-07-02 LAB — TROPONIN I (HIGH SENSITIVITY)
Troponin I (High Sensitivity): 216 ng/L (ref <18)
Troponin I (High Sensitivity): 161 ng/L (ref <18)

## 2019-07-02 LAB — HIV ANTIBODY (ROUTINE TESTING W REFLEX): HIV Screen 4th Generation wRfx: NONREACTIVE

## 2019-07-02 LAB — SARS CORONAVIRUS 2 (TAT 6-24 HRS): SARS Coronavirus 2: NEGATIVE

## 2019-07-02 MED ORDER — CO Q 10 100 MG PO CAPS: 100.0000 mg | ORAL_CAPSULE | Freq: Every day | ORAL | Status: DC

## 2019-07-02 MED ORDER — PROSTATE PO CAPS: ORAL_CAPSULE | Freq: Every day | ORAL | Status: DC

## 2019-07-02 MED ORDER — METOPROLOL SUCCINATE ER 25 MG PO TB24
25.0000 mg | ORAL_TABLET | Freq: Every day | ORAL | Status: DC
  Administered 2019-07-02: 25 mg via ORAL
  Filled 2019-07-02: qty 1

## 2019-07-02 MED ORDER — METOPROLOL SUCCINATE ER 25 MG PO TB24: 25.0000 mg | ORAL_TABLET | Freq: Every day | ORAL | 1 refills | Status: DC

## 2019-07-02 MED ORDER — SODIUM CHLORIDE 0.9% FLUSH: 3.0000 mL | INTRAVENOUS | Status: DC | PRN

## 2019-07-02 MED ORDER — PROCHLORPERAZINE MALEATE 5 MG PO TABS: 10.0000 mg | ORAL_TABLET | Freq: Four times a day (QID) | ORAL | Status: DC | PRN

## 2019-07-02 MED ORDER — SUCRALFATE 1 G PO TABS
1.0000 g | ORAL_TABLET | Freq: Three times a day (TID) | ORAL | Status: DC
  Administered 2019-07-02: 1 g via ORAL
  Filled 2019-07-02: qty 1

## 2019-07-02 MED ORDER — ENOXAPARIN SODIUM 60 MG/0.6ML ~~LOC~~ SOLN
45.0000 mg | SUBCUTANEOUS | Status: AC
  Administered 2019-07-02: 45 mg via SUBCUTANEOUS
  Filled 2019-07-02 (×2): qty 0.45

## 2019-07-02 MED ORDER — RISAQUAD PO CAPS
1.0000 | ORAL_CAPSULE | Freq: Every day | ORAL | Status: DC
  Filled 2019-07-02 (×4): qty 1

## 2019-07-02 MED ORDER — ASPIRIN EC 325 MG PO TBEC
325.0000 mg | DELAYED_RELEASE_TABLET | Freq: Once | ORAL | Status: AC
  Administered 2019-07-02: 325 mg via ORAL
  Filled 2019-07-02: qty 1

## 2019-07-02 MED ORDER — ZOLPIDEM TARTRATE 5 MG PO TABS: 5.0000 mg | ORAL_TABLET | Freq: Every evening | ORAL | Status: DC | PRN

## 2019-07-02 MED ORDER — ENOXAPARIN SODIUM 40 MG/0.4ML ~~LOC~~ SOLN
40.0000 mg | SUBCUTANEOUS | Status: DC
  Administered 2019-07-02: 06:00:00 40 mg via SUBCUTANEOUS
  Filled 2019-07-02: qty 0.4

## 2019-07-02 MED ORDER — VITAMIN C 500 MG PO TABS
2000.0000 mg | ORAL_TABLET | Freq: Every day | ORAL | Status: DC
  Administered 2019-07-02: 2000 mg via ORAL
  Filled 2019-07-02: qty 4

## 2019-07-02 MED ORDER — SODIUM CHLORIDE 0.9 % IV SOLN: 250.0000 mL | INTRAVENOUS | Status: DC | PRN

## 2019-07-02 MED ORDER — SODIUM CHLORIDE 0.9% FLUSH: 3.0000 mL | Freq: Two times a day (BID) | INTRAVENOUS | Status: DC

## 2019-07-02 MED ORDER — ENOXAPARIN SODIUM 100 MG/ML ~~LOC~~ SOLN: 1.0000 mg/kg | Freq: Two times a day (BID) | SUBCUTANEOUS | Status: DC

## 2019-07-02 NOTE — Discharge Summary (Addendum)
Physician Discharge Summary  JOHNTAVIUS [name] IOX:[id number] DOB: [id number] DOA: 07/01/2019  PCP: [name], L.[name], MD  Admit date: 07/01/2019 Discharge date: 07/02/2019  Time spent: 45 minutes  Recommendations for Outpatient Follow-up:  Follow up with cardiology 2-4 weeks for evaluation of heart rate/rhythm and BP as BB started at discharge Follow up with radiation oncology as scheduled   Discharge Diagnoses:  Principal Problem:   SVT (supraventricular tachycardia) ([place]) Active Problems:   Lung mass   Elevated troponin level   CAD S/P percutaneous coronary angioplasty - DES PCI to occluded LAD during anterior STEMI   Essential hypertension   S/P minimally invasive mitral valve repair   Adenocarcinoma of right lung, stage 3 (HCC)   History of Severe Mitral valve prolapse - severe prolapse of the posterior (P2) leaflet with severe MR   Dyslipidemia, goal LDL below 70   Discharge Condition: stable  Diet recommendation: heart healthy  There were no vitals filed for this visit.  History of present illness:  Alan [name] is a 63 y.o. with medical history significant of coronary artery disease status post angioplasty, stents 2005, essential hypertension, dyslipidemia, GERD, mitral valve plasty for mitral prolapse, lung CA currently radiation therapy, came to ED 11/17 with a chief complaint of palpitation lightheadedness mild shortness of breath.  He was not feeling good that morning.  Had session of radiation and went home and became dizzy lightheaded and felt palpitation.Denied cp. He did check his BP and found it to be in the 27' with HR 195. Called paramedics who found him in SVT converted to sinus with 6 mg of adenosine. Symptoms reportedly resolved quickly.  Hospital Course:  SVT After radiation on 11/17 the patient began to feel dizzy and sob. EMS was called and pulse was found to be elevated to 190s with low BP; received 6mg  IV adenosine x 1 and symptoms resolved. EKG NSR.  trops elevated. CT angio neg PE.  Patient reportedly stopping all his cardiac meds a while ago. CT chest negaive for PE. Evaluated by cardiology who opine SVT in the setting of Stage 3 lung cancer/radiation therapy and recommended BB to be started and OP follow up in 2-4 weeks.   Elevated troponin. Likely related to demand. No chest pain. trops 19>>120>>216>>161. ekg as noted above. Evaluated by cardiology who opined related to SVT/demand ischemia and not ACS.    Hypokalemia/hypomagnesemia - repleted and resolved at discharge   Stage III Lung cancer notified dr Sondra Come of patients treatment and likely discharge via voice mail per patient request. He has radiation this afternooe  Consultations: Dr Marlou Porch cardiology  Discharge Exam: Vitals:   07/02/19 1020 07/02/19 1030  BP: 100/77 121/87  Pulse: 98 100  Resp: 17 (!) 21  Temp:    SpO2: 96% 96%    General: awake alert no acute distress Cardiovascular: rrr no mgr no LE edema Respiratory: normal effort BS clear bilaterally no wheeze  Discharge Instructions   Discharge Instructions     Call MD for:  difficulty breathing, headache or visual disturbances   Complete by: As directed    Call MD for:  persistant dizziness or light-headedness   Complete by: As directed    Call MD for:  severe uncontrolled pain   Complete by: As directed    Call MD for:  temperature >100.4   Complete by: As directed    Diet - low sodium heart healthy   Complete by: As directed    Discharge instructions   Complete by: As  directed    Follow up with radiation oncology as scheduled Follow up with cards in 2-4 weeks Take medications as prescribed   Increase activity slowly   Complete by: As directed       Allergies as of 07/02/2019       Reactions   A-cillin [ampicillin] Shortness Of Breath, Swelling, Rash   Did it involve swelling of the face/tongue/throat, SOB, or low BP? Yes Did it involve sudden or severe rash/hives, skin peeling, or any  reaction on the inside of your mouth or nose? Yes Did you need to seek medical attention at a hospital or doctor's office? Yes When did it last happen? ~20 years ago If all above answers are "NO", may proceed with cephalosporin use.   Amoxicillin Hives, Shortness Of Breath, Swelling   Did it involve swelling of the face/tongue/throat, SOB, or low BP? Yes Did it involve sudden or severe rash/hives, skin peeling, or any reaction on the inside of your mouth or nose? Yes Did you need to seek medical attention at a hospital or doctor's office? Yes When did it last happen? ~20 years ago If all above answers are "NO", may proceed with cephalosporin use.   Other Hives, Shortness Of Breath, Swelling   ALL "CILLINS"   Penicillins Hives, Shortness Of Breath, Swelling   Did it involve swelling of the face/tongue/throat, SOB, or low BP? Yes Did it involve sudden or severe rash/hives, skin peeling, or any reaction on the inside of your mouth or nose? Yes Did you need to seek medical attention at a hospital or doctor's office? Yes When did it last happen? ~20 years ago If all above answers are "NO", may proceed with cephalosporin use.        Medication List     TAKE these medications    Co Q 10 100 MG Caps Take 100 mg by mouth daily.   D3 + K2 DOTS PO Take 1 tablet by mouth daily.   metoprolol succinate 25 MG 24 hr tablet Commonly known as: TOPROL-XL Take 1 tablet (25 mg total) by mouth daily.   PROBIOTIC DAILY PO Take 1 capsule by mouth daily.   prochlorperazine 10 MG tablet Commonly known as: COMPAZINE Take 1 tablet (10 mg total) by mouth every 6 (six) hours as needed for nausea or vomiting.   PROSTATE PO Take 1 capsule by mouth daily. PROSTATE FORMULA   sucralfate 1 g tablet Commonly known as: Carafate Take 1 tablet (1 g total) by mouth 4 (four) times daily -  with meals and at bedtime. crush and dissolve in 10 cc of water prior to consuming   vitamin C 1000 MG tablet Take  2,000 mg by mouth daily.   zolpidem 5 MG tablet Commonly known as: AMBIEN TAKE 1 TABLET BY MOUTH EVERY NIGHT AT BEDTIME AS NEEDED FOR SLEEP What changed:  how much to take how to take this when to take this reasons to take this additional instructions       Allergies  Allergen Reactions   A-Cillin [Ampicillin] Shortness Of Breath, Swelling and Rash    Did it involve swelling of the face/tongue/throat, SOB, or low BP? Yes Did it involve sudden or severe rash/hives, skin peeling, or any reaction on the inside of your mouth or nose? Yes Did you need to seek medical attention at a hospital or doctor's office? Yes When did it last happen? ~20 years ago If all above answers are "NO", may proceed with cephalosporin use.  Amoxicillin Hives, Shortness Of Breath and Swelling    Did it involve swelling of the face/tongue/throat, SOB, or low BP? Yes Did it involve sudden or severe rash/hives, skin peeling, or any reaction on the inside of your mouth or nose? Yes Did you need to seek medical attention at a hospital or doctor's office? Yes When did it last happen? ~20 years ago If all above answers are "NO", may proceed with cephalosporin use.    Other Hives, Shortness Of Breath and Swelling    ALL "CILLINS"   Penicillins Hives, Shortness Of Breath and Swelling    Did it involve swelling of the face/tongue/throat, SOB, or low BP? Yes Did it involve sudden or severe rash/hives, skin peeling, or any reaction on the inside of your mouth or nose? Yes Did you need to seek medical attention at a hospital or doctor's office? Yes When did it last happen? ~20 years ago If all above answers are "NO", may proceed with cephalosporin use.     Follow-up Information     Lendon Colonel, NP Follow up on 07/15/2019.   Specialties: Nurse Practitioner, Radiology, Cardiology Why: Please go to hospital follow-up December 1st at 11:00 AM Contact information: [address] [place] [address] [phone]         [name], L.[name], MD Follow up in 1 week(s).   Specialty: Family Medicine Contact information: [address] [place] [address] [phone]             The results of significant diagnostics from this hospitalization (including imaging, microbiology, ancillary and laboratory) are listed below for reference.    Significant Diagnostic Studies: Dg Chest 2 View  Result Date: 07/01/2019 CLINICAL DATA:  Tachycardia EXAM: CHEST - 2 VIEW COMPARISON:  05/16/2019, 04/18/2019, 07/26/2018, CT 04/30/2019 FINDINGS: Left lung is clear. No significant change in the appearance of right hilar opacity and vague right lower lobe opacity. Normal heart size. No pneumothorax. IMPRESSION: No acute interval change compared to prior radiograph from October. Known right hilar mass and lower lobe mass are better seen on CT. Similar appearance of streaky right lower lobe opacities. Electronically Signed   By: Donavan Foil M.D.   On: 07/01/2019 18:20   Ct Angio Chest Pe W And/or Wo Contrast  Result Date: 07/01/2019 CLINICAL DATA:  Dizziness, shortness of breath. History of lung cancer and radiation treatment EXAM: CT ANGIOGRAPHY CHEST WITH CONTRAST TECHNIQUE: Multidetector CT imaging of the chest was performed using the standard protocol during bolus administration of intravenous contrast. Multiplanar CT image reconstructions and MIPs were obtained to evaluate the vascular anatomy. CONTRAST:  1mL OMNIPAQUE IOHEXOL 350 MG/ML SOLN COMPARISON:  04/30/2019 FINDINGS: Cardiovascular: No filling defects in the pulmonary arteries to suggestpulmonary emboli. Heart is normal size. Aorta is normal caliber. Coronary artery and aortic calcifications. Mediastinum/Nodes: Enlarged anterior mediastinal/prevascular lymph node measuring up to 15 mm compared to 11 mm previously. Right hilar/infrahilar mass encases the right lower lobe pulmonary artery severely narrowing it. This is  stable since prior study. This narrows the right middle lobe bronchus. Lungs/Pleura: Right hilar/infrahilar mass again noted as seen on prior study, stable. Areas of atelectasis in the right middle lobe, likely postobstructive. Peripheral opacities in the right lower lobe also again noted, stable, likely atelectasis. No effusions. Left lung clear. Upper Abdomen: Imaging into the upper abdomen shows no acute findings. Musculoskeletal: Chest wall soft tissues are unremarkable. No acute bony abnormality. Review of the MIP images confirms the above findings. IMPRESSION:  No evidence of pulmonary embolus. Coronary artery disease. Stable right hilar/infrahilar mass with narrowing of the right lower lobe pulmonary artery, right middle lobe bronchus and right bronchus intermedius. Areas of atelectasis in the right middle lobe and lower lobe are unchanged. Overall appearance is stable. Mediastinal adenopathy. Prevascular lymph node measures slightly larger than prior study. Aortic Atherosclerosis (ICD10-I70.0). Electronically Signed   By: Rolm Baptise M.D.   On: 07/01/2019 22:38   Mr Jeri Cos [place] Contrast  Result Date: 06/04/2019 CLINICAL DATA:  62 year old male with non-small cell lung cancer. Staging. EXAM: MRI HEAD WITHOUT AND WITH CONTRAST TECHNIQUE: Multiplanar, multiecho pulse sequences of the brain and surrounding structures were obtained without and with intravenous contrast. CONTRAST:  65mL GADAVIST GADOBUTROL 1 MMOL/ML IV SOLN COMPARISON:  PET-CT 05/15/2019. head CT 07/26/2018. FINDINGS: Brain: No abnormal enhancement identified. No midline shift, mass effect, or evidence of intracranial mass lesion. No dural thickening. No restricted diffusion to suggest acute infarction. No ventriculomegaly, extra-axial collection or acute intracranial hemorrhage. Cervicomedullary junction and pituitary are within normal limits. Generally mild for age scattered nonspecific cerebral white matter T2 and FLAIR hyperintensity. There  is more intense involvement of the posterior left lentiform or deep white matter capsules. There is also a small chronic microhemorrhage in the right lower parietal lobe on series 7, image 20. But no cortical encephalomalacia or other chronic cerebral blood products. Possible small chronic lacunar infarct in the left cerebellum on series 5, image 7. Negative brainstem. Vascular: Major intracranial vascular flow voids are preserved. The major dural venous sinuses are enhancing and appear to be patent. Skull and upper cervical spine: Negative visible cervical spine and spinal cord. Visualized bone marrow signal is within normal limits. Sinuses/Orbits: Negative orbits. Negative paranasal sinuses. Other: Mastoids are clear. Visible internal auditory structures appear normal. Normal stylomastoid foramina. Scalp and face soft tissues appear negative. IMPRESSION: 1. No metastatic disease or acute intracranial abnormality. 2. Mild for age signal changes in the brain, including a solitary chronic microhemorrhage in the right parietal lobe, compatible with chronic small vessel disease. Electronically Signed   By: Genevie Ann M.D.   On: 06/04/2019 16:42    Microbiology: Recent Results (from the past 240 hour(s))  SARS CORONAVIRUS 2 (TAT 6-24 HRS) Nasopharyngeal Nasopharyngeal Swab     Status: None   Collection Time: 07/01/19  9:18 PM   Specimen: Nasopharyngeal Swab  Result Value Ref Range Status   SARS Coronavirus 2 NEGATIVE NEGATIVE Final    Comment: (NOTE) SARS-CoV-2 target nucleic acids are NOT DETECTED. The SARS-CoV-2 RNA is generally detectable in upper and lower respiratory specimens during the acute phase of infection. Negative results do not preclude SARS-CoV-2 infection, do not rule out co-infections with other pathogens, and should not be used as the sole basis for treatment or other patient management decisions. Negative results must be combined with clinical observations, patient history, and  epidemiological information. The expected result is Negative. Fact Sheet for Patients: SugarRoll.be Fact Sheet for Healthcare Providers: https://www.woods-mathews.com/ This test is not yet approved or cleared by the [place] FDA and  has been authorized for detection and/or diagnosis of SARS-CoV-2 by FDA under an Emergency Use Authorization (EUA). This EUA will remain  in effect (meaning this test can be used) for the duration of the COVID-19 declaration under Section 56 4(b)(1) of the Act, 21 U.S.C. section 360bbb-3(b)(1), unless the authorization is terminated or revoked sooner. Performed at [facility], [place] [address], [place], [place] [address]  Labs: Basic Metabolic Panel: Recent Labs  Lab 06/30/19 0950 07/01/19 1723 07/01/19 1737 07/02/19 0606  NA 142 136  --  140  K 4.1 3.4*  --  4.0  CL 107 102  --  107  CO2 24 23  --  23  GLUCOSE 90 108*  --  97  BUN 13 19  --  10  CREATININE 0.72 0.96  --  0.63  CALCIUM 9.0 8.6*  --  8.5*  MG  --   --  1.8  --    Liver Function Tests: Recent Labs  Lab 06/30/19 0950  AST 17  ALT 18  ALKPHOS 85  BILITOT 0.7  PROT 6.5  ALBUMIN 3.9   No results for input(s): LIPASE, AMYLASE in the last 168 hours. No results for input(s): AMMONIA in the last 168 hours. CBC: Recent Labs  Lab 06/30/19 0950 07/01/19 1723  WBC 3.0* 3.9*  NEUTROABS 2.3  --   HGB 12.5* 11.4*  HCT 37.3* 34.9*  MCV 92.6 95.9  PLT 164 122*   Cardiac Enzymes: No results for input(s): CKTOTAL, CKMB, CKMBINDEX, TROPONINI in the last 168 hours. BNP: BNP (last 3 results) No results for input(s): BNP in the last 8760 hours.  ProBNP (last 3 results) No results for input(s): PROBNP in the last 8760 hours.  CBG: No results for input(s): GLUCAP in the last 168 hours.     SignedRadene Gunning NP  [facility] 07/02/2019, 11:03 AM

## 2019-07-02 NOTE — ED Notes (Signed)
Hospital bed ordered.

## 2019-07-02 NOTE — ED Notes (Signed)
Pt d/c home per MD order. Discharge summary reviewed, pt verbalizes understanding. Pt off unit via wheelchair, reports son is discharge ride home. No s/s of acute distress noted.

## 2019-07-02 NOTE — ED Notes (Signed)
Breakfast Ordered 

## 2019-07-02 NOTE — Discharge Instructions (Signed)
Supraventricular Tachycardia, Adult Supraventricular tachycardia (SVT) is a type of abnormal heart rhythm. It causes the heart to beat very quickly and then return to a normal speed. A normal resting heart rate is 60-100 beats per minute. During an episode of SVT, your heart rate may be higher than 150 beats per minute. Episodes of SVT can be frightening, but they are usually not dangerous. However, if episodes happen several times per day or last longer than a few seconds, they may lead to heart failure. What are the causes?  Usually, a normal heartbeat starts when an area called the sinoatrial node releases an electrical signal. In SVT, other areas of the heart send out electrical signals that interfere with the signal from the sinoatrial node. It is not known why some people get SVT and others do not. What increases the risk? You are more likely to develop this condition if you are:  12-30 years old.  A woman. The following factors may make you more likely to develop this condition:  Stress.  Tiredness.  Smoking.  Stimulant drugs, such as cocaine and methamphetamine.  Alcohol.  Caffeine.  Pregnancy.  Anxiety. What are the signs or symptoms? Symptoms of this condition include:  A pounding heart.  A feeling that the heart is skipping beats (palpitations).  Weakness.  Shortness of breath.  Tightness or pain in your chest.  Light-headedness.  Anxiety.  Dizziness.  Sweating.  Nausea.  Fainting.  Fatigue or tiredness. A mild episode may not cause symptoms. How is this diagnosed? This condition may be diagnosed based on:  Your symptoms.  A physical exam. ? If you have an episode of SVT during the exam, the health care provider may be able to diagnose SVT by listening to your heart and feeling your pulse.  Tests. These may include: ? An electrocardiogram (ECG). This test is done to check for problems with electrical activity in the heart. ? A Holter  monitor or event monitor test. This test involves wearing a portable device that monitors your heart rate over time. ? An echocardiogram. This test involves taking an image of your heart using sound waves. It is done to rule out other causes of a fast heart rate. ? Blood tests. How is this treated? This condition may be treated with:  Vagal nerve stimulation. The treatment involves stimulating your vagus nerve, which slows down the heart. It is often the first and only treatment that is needed for this condition. Work with your health care provider to find which one works best for you. Ways to do this treatment include: ? Holding your breath and pushing, as though you are having a bowel movement. ? Massaging an area on one side of your neck, below your jaw. Do not try this yourself. Only a health care provider should do this. If done the wrong way, it can lead to a stroke. ? Bending forward with your head between your legs. ? Coughing while bending forward with your head between your legs. ? Closing your eyes and massaging your eyeballs. A health care provider should guide you through this method before you try it on your own.  Medicines that prevent attacks.  Medicine to stop an attack. The medicine is given through an IV at the hospital.  A small electric shock (cardioversion) that stops an attack. Before you get the shock, you will get medicine to make you fall asleep.  Radiofrequency ablation. In this procedure, a small, thin tube (catheter) is used to send radiofrequency   energy to the area of tissue that is causing the rapid heartbeats. The energy kills the cells and helps your heart keep a normal rhythm. You may have this treatment if you have symptoms of SVT often. If you do not have symptoms, you may not need treatment. Follow these instructions at home: Stress  Avoid stressful situations when possible.  Find healthy ways of managing stress that work for you. Some healthy ways to  manage stress include: ? Taking part in relaxing activities, such as yoga, meditation, or being out in nature. ? Listening to relaxing music. ? Practicing relaxation techniques, such as deep breathing. ? Leading a healthy lifestyle. This involves getting plenty of sleep, exercising, and eating a balanced diet. ? Attending counseling or talk therapy with a mental health professional. Lifestyle   Try to get at least 7 hours of sleep each night.  Do not use any products that contain nicotine or tobacco, such as cigarettes, e-cigarettes, and chewing tobacco. If you need help quitting, ask your health care provider.  Be aware of how alcohol affects your condition. If alcohol: ? Triggers episodes of SVT, do not drink alcohol. ? Does not seem to trigger episodes, limit alcohol intake to no more than 1 drink a day for nonpregnant women and 2 drinks a day for men. Be aware of how much alcohol is in your drink. In the [place] one drink equals one 12 oz bottle of beer (355 mL), one 5 oz glass of wine (148 mL), or one 1 oz glass of hard liquor (44 mL).  Be aware of how caffeine affects your condition. If caffeine: ? Triggers episodes of SVT, do not eat, drink, or use anything with caffeine in it. ? Does not seem to trigger episodes, consume caffeine in moderation.  Do not use stimulant drugs. If you need help quitting, talk with your health care provider. General instructions  Maintain a healthy weight.  Exercise regularly. Ask your health care provider to suggest some good activities for you. Aim for one or a combination of the following: ? 150 minutes per week of moderate exercise, such as walking or yoga. ? 75 minutes per week of vigorous exercise, such as running or swimming.  Perform vagus nerve stimulation as directed by your health care provider.  Take over-the-counter and prescription medicines only as told by your health care provider.  Keep all follow-up visits as told by your health  care provider. This is important. Contact a health care provider if:  You have episodes of SVT more often than before.  Episodes of SVT last longer than before.  Vagus nerve stimulation is no longer helping.  You have new symptoms. Get help right away if:  You have chest pain.  Your symptoms get worse.  You have trouble breathing.  You have an episode of SVT that lasts longer than 20 minutes.  You faint. These symptoms may represent a serious problem that is an emergency. Do not wait to see if the symptoms will go away. Get medical help right away. Call your local emergency services (911 in the [place] Do not drive yourself to the hospital. Summary  Supraventricular tachycardia (SVT) is a type of abnormal heart rhythm.  During an episode of SVT, your heart rate may be higher than 150 beats per minute.  Treatment depends on frequency of occurrence and symptoms experienced. This information is not intended to replace advice given to you by your health care provider. Make sure you discuss any questions you have   with your health care provider. Document Released: 07/31/2005 Document Revised: 06/18/2018 Document Reviewed: 06/18/2018 Elsevier Patient Education  2020 [employer]  

## 2019-07-02 NOTE — Progress Notes (Addendum)
Progress Note  Patient Name: Alan [name] Date of Encounter: 07/02/2019  Primary Cardiologist: Glenetta Hew, MD   Subjective   Patient is feeling better today. He denies CP or SOB. HR have been 80-90s.   Inpatient Medications    Scheduled Meds: . acidophilus  1 capsule Oral Daily  . enoxaparin (LOVENOX) injection  1 mg/kg Subcutaneous Q12H  . sodium chloride flush  3 mL Intravenous Once  . sodium chloride flush  3 mL Intravenous Q12H  . sucralfate  1 g Oral TID WC & HS  . vitamin C  2,000 mg Oral Daily   Continuous Infusions: . sodium chloride     PRN Meds: sodium chloride, prochlorperazine, sodium chloride flush, zolpidem   Vital Signs    Vitals:   07/02/19 0810 07/02/19 0817 07/02/19 0821 07/02/19 0830  BP:  105/83  108/78  Pulse: 97   96  Resp: 18   14  Temp:   (!) 97.5 F (36.4 C)   TempSrc:   Oral   SpO2: 97% 95%  95%    Intake/Output Summary (Last 24 hours) at 07/02/2019 0900 Last data filed at 07/02/2019 0201 Gross per 24 hour  Intake 550 ml  Output -  Net 550 ml   Last 3 Weights 06/30/2019 06/16/2019 06/09/2019  Weight (lbs) 184 lb 4.8 oz 184 lb 14.4 oz 184 lb 12 oz  Weight (kg) 83.598 kg 83.87 kg 83.802 kg      Telemetry    NSR, HR 80-90s; sinus tach peak 120s; PACs and PVCs - Personally Reviewed  ECG    Pending - Personally Reviewed  Physical Exam   GEN: No acute distress.   Neck: No JVD Cardiac: RRR, no murmurs, rubs, or gallops.  Respiratory: Clear to auscultation bilaterally. GI: Soft, nontender, non-distended  MS: No edema; No deformity. Neuro:  Nonfocal  Psych: Normal affect   Labs    High Sensitivity Troponin:   Recent Labs  Lab 07/01/19 1737 07/01/19 1952 07/02/19 0218 07/02/19 0606  TROPONINIHS 19* 120* 216* 161*      Chemistry Recent Labs  Lab 06/30/19 0950 07/01/19 1723 07/02/19 0606  NA 142 136 140  K 4.1 3.4* 4.0  CL 107 102 107  CO2 24 23 23   GLUCOSE 90 108* 97  BUN 13 19 10   CREATININE 0.72  0.96 0.63  CALCIUM 9.0 8.6* 8.5*  PROT 6.5  --   --   ALBUMIN 3.9  --   --   AST 17  --   --   ALT 18  --   --   ALKPHOS 85  --   --   BILITOT 0.7  --   --   GFRNONAA >60 >60 >60  GFRAA >60 >60 >60  ANIONGAP 11 11 10      Hematology Recent Labs  Lab 06/30/19 0950 07/01/19 1723  WBC 3.0* 3.9*  RBC 4.03* 3.64*  HGB 12.5* 11.4*  HCT 37.3* 34.9*  MCV 92.6 95.9  MCH 31.0 31.3  MCHC 33.5 32.7  RDW 12.9 13.0  PLT 164 122*    BNPNo results for input(s): BNP, PROBNP in the last 168 hours.   DDimer No results for input(s): DDIMER in the last 168 hours.   Radiology    Dg Chest 2 View  Result Date: 07/01/2019 CLINICAL DATA:  Tachycardia EXAM: CHEST - 2 VIEW COMPARISON:  05/16/2019, 04/18/2019, 07/26/2018, CT 04/30/2019 FINDINGS: Left lung is clear. No significant change in the appearance of right hilar opacity and vague right  lower lobe opacity. Normal heart size. No pneumothorax. IMPRESSION: No acute interval change compared to prior radiograph from October. Known right hilar mass and lower lobe mass are better seen on CT. Similar appearance of streaky right lower lobe opacities. Electronically Signed   By: Donavan Foil M.D.   On: 07/01/2019 18:20   Ct Angio Chest Pe W And/or Wo Contrast  Result Date: 07/01/2019 CLINICAL DATA:  Dizziness, shortness of breath. History of lung cancer and radiation treatment EXAM: CT ANGIOGRAPHY CHEST WITH CONTRAST TECHNIQUE: Multidetector CT imaging of the chest was performed using the standard protocol during bolus administration of intravenous contrast. Multiplanar CT image reconstructions and MIPs were obtained to evaluate the vascular anatomy. CONTRAST:  20mL OMNIPAQUE IOHEXOL 350 MG/ML SOLN COMPARISON:  04/30/2019 FINDINGS: Cardiovascular: No filling defects in the pulmonary arteries to suggestpulmonary emboli. Heart is normal size. Aorta is normal caliber. Coronary artery and aortic calcifications. Mediastinum/Nodes: Enlarged anterior  mediastinal/prevascular lymph node measuring up to 15 mm compared to 11 mm previously. Right hilar/infrahilar mass encases the right lower lobe pulmonary artery severely narrowing it. This is stable since prior study. This narrows the right middle lobe bronchus. Lungs/Pleura: Right hilar/infrahilar mass again noted as seen on prior study, stable. Areas of atelectasis in the right middle lobe, likely postobstructive. Peripheral opacities in the right lower lobe also again noted, stable, likely atelectasis. No effusions. Left lung clear. Upper Abdomen: Imaging into the upper abdomen shows no acute findings. Musculoskeletal: Chest wall soft tissues are unremarkable. No acute bony abnormality. Review of the MIP images confirms the above findings. IMPRESSION: No evidence of pulmonary embolus. Coronary artery disease. Stable right hilar/infrahilar mass with narrowing of the right lower lobe pulmonary artery, right middle lobe bronchus and right bronchus intermedius. Areas of atelectasis in the right middle lobe and lower lobe are unchanged. Overall appearance is stable. Mediastinal adenopathy. Prevascular lymph node measures slightly larger than prior study. Aortic Atherosclerosis (ICD10-I70.0). Electronically Signed   By: Rolm Baptise M.D.   On: 07/01/2019 22:38    Cardiac Studies   Echo 2019 - Left ventricle: The cavity size was normal. Systolic function was   normal. The estimated ejection fraction was in the range of 50%   to 55%. Wall motion was normal; there were no regional wall   motion abnormalities. The study is indeterminate for the   evaluation of LV diastolic function. - Mitral valve: Prior procedures included surgical repair. The   sewing ring appeared normal. The findings are consistent with   mild stenosis. There was trivial regurgitation. Mean gradient   (D): 6 mm Hg.  Patient Profile     63 y.o. male with hx of CAD (LAD STEMI s/p PCI in 2018), MV repair, Stage 3 lung cancer on radiation  and chemotherapy who presented with presyncope and found to be in suspected SVT.  Assessment & Plan   SVT While in radiation yesterday the patient began to feel dizzy and sob. EMS was called and pulse was found to be elevated to 190s with low BP; received 6mg  IV adenosine x 1 and symptoms resolved. 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 In the ED he was maintaining NSR. Patient reportedly stopping all his cardiac meds a while ago. CT chest negaive for PE - Suspected SVT in the setting of Stage 3 lung cancer/radiation therapy - Patient denies chest pain. Heparin not started.  - HS troponin peak 216 likely 2/2 SVT  - Echo in 2019 with EF 50-55% - telemetry shows NSR HR 80-90s, some sinus tachycardia,  peak 120s; PACs and  - Will repeat EKG -  Discussed option of restarting BB (previously on metoprolol) but pressures continue to be soft. Patient says pressures normally run systolics around 160. MD to see  Hypokalemia/hypomagnesemia - repleted  Stage III Lung cancer - per IM  For questions or updates, please contact [facility] Please consult [email or web address] for contact info under        Signed, Cadence Ninfa Meeker, PA-C  07/02/2019, 9:00 AM    Personally seen and examined. Agree with above.   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  Currently feels well.  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  GEN: Well nourished, well developed, in no acute distress  HEENT: normal  Neck: no JVD, carotid bruits, or masses Cardiac: RRR; no murmurs, rubs, or gallops,no edema  Respiratory:  clear to auscultation bilaterally, normal work of breathing GI: soft, nontender, nondistended, + BS MS: no deformity or atrophy  Skin: warm and dry, no rash Neuro:  Alert and Oriented x 3, Strength and sensation are intact Psych: euthymic mood, full affect  -Agree with Toprol, metoprolol succinate, 25 mg once a day.  Continue hydration.  I have told him several different vagal maneuvers.  Elevated troponin in the  setting of SVT -Demand ischemia.  This is not acute coronary syndrome.  Explained to him.  -Discussed with Dr. Eulogio Bear as well. We will have follow-up in cardiology clinic in 2 to 4 weeks.  Candee Furbish, MD

## 2019-07-02 NOTE — ED Notes (Signed)
Cardiology PA at bedside. 

## 2019-07-03 ENCOUNTER — Other Ambulatory Visit: Payer: Self-pay

## 2019-07-03 ENCOUNTER — Ambulatory Visit
Admission: RE | Admit: 2019-07-03 | Discharge: 2019-07-03 | Disposition: A | Discharge status: Home / Self Care | Payer: Federal, State, Local not specified - PPO | Source: Ambulatory Visit | Attending: Radiation Oncology | Admitting: Radiation Oncology

## 2019-07-03 DIAGNOSIS — Z510 Encounter for antineoplastic radiation therapy: Secondary | ICD-10-CM | POA: Diagnosis not present

## 2019-07-04 ENCOUNTER — Other Ambulatory Visit: Payer: Self-pay

## 2019-07-04 ENCOUNTER — Ambulatory Visit
Admission: RE | Admit: 2019-07-04 | Discharge: 2019-07-04 | Disposition: A | Discharge status: Home / Self Care | Payer: Federal, State, Local not specified - PPO | Source: Ambulatory Visit | Attending: Radiation Oncology | Admitting: Radiation Oncology

## 2019-07-04 DIAGNOSIS — Z510 Encounter for antineoplastic radiation therapy: Secondary | ICD-10-CM | POA: Diagnosis not present

## 2019-07-06 ENCOUNTER — Other Ambulatory Visit: Payer: Self-pay

## 2019-07-06 ENCOUNTER — Ambulatory Visit
Admission: RE | Admit: 2019-07-06 | Discharge: 2019-07-06 | Disposition: A | Discharge status: Home / Self Care | Payer: Federal, State, Local not specified - PPO | Source: Ambulatory Visit | Attending: Radiation Oncology | Admitting: Radiation Oncology

## 2019-07-06 DIAGNOSIS — Z510 Encounter for antineoplastic radiation therapy: Secondary | ICD-10-CM | POA: Diagnosis not present

## 2019-07-07 ENCOUNTER — Ambulatory Visit
Admission: RE | Admit: 2019-07-07 | Discharge: 2019-07-07 | Disposition: A | Discharge status: Home / Self Care | Payer: Federal, State, Local not specified - PPO | Source: Ambulatory Visit | Attending: Radiation Oncology | Admitting: Radiation Oncology

## 2019-07-07 ENCOUNTER — Other Ambulatory Visit: Payer: Self-pay

## 2019-07-07 ENCOUNTER — Inpatient Hospital Stay: Payer: Federal, State, Local not specified - PPO

## 2019-07-07 VITALS — BP 112/80 | HR 79 | Temp 98.5°F | Resp 18

## 2019-07-07 DIAGNOSIS — Z510 Encounter for antineoplastic radiation therapy: Secondary | ICD-10-CM | POA: Diagnosis not present

## 2019-07-07 DIAGNOSIS — C3431 Malignant neoplasm of lower lobe, right bronchus or lung: Secondary | ICD-10-CM | POA: Diagnosis not present

## 2019-07-07 DIAGNOSIS — C3491 Malignant neoplasm of unspecified part of right bronchus or lung: Secondary | ICD-10-CM

## 2019-07-07 LAB — CBC WITH DIFFERENTIAL (CANCER CENTER ONLY)
Abs Immature Granulocytes: 0.03 10*3/uL (ref 0.00–0.07)
Basophils Absolute: 0.1 10*3/uL (ref 0.0–0.1)
Basophils Relative: 1 %
Eosinophils Absolute: 0.1 10*3/uL (ref 0.0–0.5)
Eosinophils Relative: 2 %
HCT: 35 % — ABNORMAL LOW (ref 39.0–52.0)
Hemoglobin: 11.7 g/dL — ABNORMAL LOW (ref 13.0–17.0)
Immature Granulocytes: 1 %
Lymphocytes Relative: 9 %
Lymphs Abs: 0.4 10*3/uL — ABNORMAL LOW (ref 0.7–4.0)
MCH: 31.2 pg (ref 26.0–34.0)
MCHC: 33.4 g/dL (ref 30.0–36.0)
MCV: 93.3 fL (ref 80.0–100.0)
Monocytes Absolute: 0.3 10*3/uL (ref 0.1–1.0)
Monocytes Relative: 9 %
Neutro Abs: 3.1 10*3/uL (ref 1.7–7.7)
Neutrophils Relative %: 78 %
Platelet Count: 124 10*3/uL — ABNORMAL LOW (ref 150–400)
RBC: 3.75 MIL/uL — ABNORMAL LOW (ref 4.22–5.81)
RDW: 13.2 % (ref 11.5–15.5)
WBC Count: 3.9 10*3/uL — ABNORMAL LOW (ref 4.0–10.5)
nRBC: 0 % (ref 0.0–0.2)

## 2019-07-07 LAB — CMP (CANCER CENTER ONLY)
ALT: 24 U/L (ref 0–44)
AST: 18 U/L (ref 15–41)
Albumin: 3.8 g/dL (ref 3.5–5.0)
Alkaline Phosphatase: 78 U/L (ref 38–126)
Anion gap: 10 (ref 5–15)
BUN: 15 mg/dL (ref 8–23)
CO2: 25 mmol/L (ref 22–32)
Calcium: 9 mg/dL (ref 8.9–10.3)
Chloride: 107 mmol/L (ref 98–111)
Creatinine: 0.75 mg/dL (ref 0.61–1.24)
GFR, Est AFR Am: >60 mL/min (ref >60)
GFR, Estimated: >60 mL/min (ref >60)
Glucose, Bld: 98 mg/dL (ref 70–99)
Potassium: 4 mmol/L (ref 3.5–5.1)
Sodium: 142 mmol/L (ref 135–145)
Total Bilirubin: 0.7 mg/dL (ref 0.3–1.2)
Total Protein: 6.4 g/dL — ABNORMAL LOW (ref 6.5–8.1)

## 2019-07-07 MED ORDER — DIPHENHYDRAMINE HCL 50 MG/ML IJ SOLN
INTRAMUSCULAR | Status: AC
  Filled 2019-07-07: qty 1

## 2019-07-07 MED ORDER — SODIUM CHLORIDE 0.9 % IV SOLN
20.0000 mg | Freq: Once | INTRAVENOUS | Status: AC
  Administered 2019-07-07: 20 mg via INTRAVENOUS
  Filled 2019-07-07: qty 20

## 2019-07-07 MED ORDER — PALONOSETRON HCL INJECTION 0.25 MG/5ML
0.2500 mg | Freq: Once | INTRAVENOUS | Status: AC
  Administered 2019-07-07: 13:00:00 0.25 mg via INTRAVENOUS

## 2019-07-07 MED ORDER — SODIUM CHLORIDE 0.9 % IV SOLN
45.0000 mg/m2 | Freq: Once | INTRAVENOUS | Status: AC
  Administered 2019-07-07: 15:00:00 96 mg via INTRAVENOUS
  Filled 2019-07-07: qty 16

## 2019-07-07 MED ORDER — DIPHENHYDRAMINE HCL 50 MG/ML IJ SOLN: 50.0000 mg | Freq: Once | INTRAMUSCULAR | Status: DC

## 2019-07-07 MED ORDER — DIPHENHYDRAMINE HCL 50 MG/ML IJ SOLN
25.0000 mg | Freq: Once | INTRAMUSCULAR | Status: AC
  Administered 2019-07-07: 25 mg via INTRAVENOUS

## 2019-07-07 MED ORDER — PALONOSETRON HCL INJECTION 0.25 MG/5ML
INTRAVENOUS | Status: AC
  Filled 2019-07-07: qty 5

## 2019-07-07 MED ORDER — FAMOTIDINE IN NACL 20-0.9 MG/50ML-% IV SOLN
20.0000 mg | Freq: Once | INTRAVENOUS | Status: AC
  Administered 2019-07-07: 20 mg via INTRAVENOUS

## 2019-07-07 MED ORDER — FAMOTIDINE IN NACL 20-0.9 MG/50ML-% IV SOLN
INTRAVENOUS | Status: AC
  Filled 2019-07-07: qty 50

## 2019-07-07 MED ORDER — SODIUM CHLORIDE 0.9 % IV SOLN
Freq: Once | INTRAVENOUS | Status: AC
  Administered 2019-07-07: 13:00:00 via INTRAVENOUS
  Filled 2019-07-07: qty 250

## 2019-07-07 MED ORDER — SODIUM CHLORIDE 0.9 % IV SOLN
276.4000 mg | Freq: Once | INTRAVENOUS | Status: AC
  Administered 2019-07-07: 280 mg via INTRAVENOUS
  Filled 2019-07-07: qty 28

## 2019-07-07 NOTE — Patient Instructions (Signed)
   [facility] Discharge Instructions for Patients Receiving Chemotherapy  Today you received the following chemotherapy agents Taxol and Carboplatin   To help prevent nausea and vomiting after your treatment, we encourage you to take your nausea medication as directed.    If you develop nausea and vomiting that is not controlled by your nausea medication, call the clinic.   BELOW ARE SYMPTOMS THAT SHOULD BE REPORTED IMMEDIATELY:  *FEVER GREATER THAN 100.5 F  *CHILLS WITH OR WITHOUT FEVER  NAUSEA AND VOMITING THAT IS NOT CONTROLLED WITH YOUR NAUSEA MEDICATION  *UNUSUAL SHORTNESS OF BREATH  *UNUSUAL BRUISING OR BLEEDING  TENDERNESS IN MOUTH AND THROAT WITH OR WITHOUT PRESENCE OF ULCERS  *URINARY PROBLEMS  *BOWEL PROBLEMS  UNUSUAL RASH Items with * indicate a potential emergency and should be followed up as soon as possible.  Feel free to call the clinic should you have any questions or concerns. The clinic phone number is [phone].  Please show the CHEMO ALERT CARD at check-in to the Emergency Department and triage nurse.   

## 2019-07-07 NOTE — Progress Notes (Signed)
Restless leg noted after diphenhydramine injection last week. Per Julien Nordmann, okay to decrease diphenhydramine dose to 25mg . Orders updated.   Demetrius Charity, PharmD, [facility] Pharmacist Pharmacy Phone: [phone] 07/07/2019

## 2019-07-08 ENCOUNTER — Other Ambulatory Visit: Payer: Self-pay

## 2019-07-08 ENCOUNTER — Ambulatory Visit
Admission: RE | Admit: 2019-07-08 | Discharge: 2019-07-08 | Disposition: A | Discharge status: Home / Self Care | Payer: Federal, State, Local not specified - PPO | Source: Ambulatory Visit | Attending: Radiation Oncology | Admitting: Radiation Oncology

## 2019-07-08 DIAGNOSIS — Z510 Encounter for antineoplastic radiation therapy: Secondary | ICD-10-CM | POA: Diagnosis not present

## 2019-07-09 ENCOUNTER — Other Ambulatory Visit: Payer: Self-pay

## 2019-07-09 ENCOUNTER — Emergency Department (HOSPITAL_COMMUNITY)
Admission: EM | Admit: 2019-07-09 | Discharge: 2019-07-09 | Disposition: A | Discharge status: Home / Self Care | Payer: Federal, State, Local not specified - PPO | Attending: Emergency Medicine | Admitting: Emergency Medicine

## 2019-07-09 ENCOUNTER — Ambulatory Visit
Admission: RE | Admit: 2019-07-09 | Discharge: 2019-07-09 | Disposition: A | Discharge status: Home / Self Care | Payer: Federal, State, Local not specified - PPO | Source: Ambulatory Visit | Attending: Radiation Oncology | Admitting: Radiation Oncology

## 2019-07-09 DIAGNOSIS — I471 Supraventricular tachycardia: Secondary | ICD-10-CM | POA: Insufficient documentation

## 2019-07-09 DIAGNOSIS — I2510 Atherosclerotic heart disease of native coronary artery without angina pectoris: Secondary | ICD-10-CM | POA: Insufficient documentation

## 2019-07-09 DIAGNOSIS — R000 Tachycardia, unspecified: Secondary | ICD-10-CM | POA: Diagnosis present

## 2019-07-09 DIAGNOSIS — I10 Essential (primary) hypertension: Secondary | ICD-10-CM | POA: Diagnosis not present

## 2019-07-09 LAB — CBC WITH DIFFERENTIAL/PLATELET
Abs Immature Granulocytes: 0.02 10*3/uL (ref 0.00–0.07)
Basophils Absolute: 0 10*3/uL (ref 0.0–0.1)
Basophils Relative: 0 %
Eosinophils Absolute: 0 10*3/uL (ref 0.0–0.5)
Eosinophils Relative: 1 %
HCT: 35.4 % — ABNORMAL LOW (ref 39.0–52.0)
Hemoglobin: 11.7 g/dL — ABNORMAL LOW (ref 13.0–17.0)
Immature Granulocytes: 1 %
Lymphocytes Relative: 6 %
Lymphs Abs: 0.2 10*3/uL — ABNORMAL LOW (ref 0.7–4.0)
MCH: 31.2 pg (ref 26.0–34.0)
MCHC: 33.1 g/dL (ref 30.0–36.0)
MCV: 94.4 fL (ref 80.0–100.0)
Monocytes Absolute: 0.2 10*3/uL (ref 0.1–1.0)
Monocytes Relative: 6 %
Neutro Abs: 2.8 10*3/uL (ref 1.7–7.7)
Neutrophils Relative %: 86 %
Platelets: 118 10*3/uL — ABNORMAL LOW (ref 150–400)
RBC: 3.75 MIL/uL — ABNORMAL LOW (ref 4.22–5.81)
RDW: 13.3 % (ref 11.5–15.5)
WBC: 3.3 10*3/uL — ABNORMAL LOW (ref 4.0–10.5)
nRBC: 0 % (ref 0.0–0.2)

## 2019-07-09 LAB — BASIC METABOLIC PANEL
Anion gap: 9 (ref 5–15)
BUN: 19 mg/dL (ref 8–23)
CO2: 25 mmol/L (ref 22–32)
Calcium: 9.1 mg/dL (ref 8.9–10.3)
Chloride: 105 mmol/L (ref 98–111)
Creatinine, Ser: 0.78 mg/dL (ref 0.61–1.24)
GFR calc Af Amer: >60 mL/min (ref >60)
GFR calc non Af Amer: >60 mL/min (ref >60)
Glucose, Bld: 107 mg/dL — ABNORMAL HIGH (ref 70–99)
Potassium: 3.8 mmol/L (ref 3.5–5.1)
Sodium: 139 mmol/L (ref 135–145)

## 2019-07-09 LAB — TSH: TSH: 1.8 u[IU]/mL (ref 0.350–4.500)

## 2019-07-09 LAB — MAGNESIUM: Magnesium: 2 mg/dL (ref 1.7–2.4)

## 2019-07-09 MED ORDER — METOPROLOL TARTRATE 25 MG PO TABS
25.0000 mg | ORAL_TABLET | Freq: Once | ORAL | Status: AC
  Administered 2019-07-09: 25 mg via ORAL
  Filled 2019-07-09: qty 1

## 2019-07-09 MED ORDER — METOPROLOL SUCCINATE ER 50 MG PO TB24: 50.0000 mg | ORAL_TABLET | Freq: Every day | ORAL | 0 refills | Status: DC

## 2019-07-09 NOTE — ED Provider Notes (Signed)
Emergency Department Provider Note   I have reviewed the triage vital signs and the nursing notes.   HISTORY  Chief Complaint Tachycardia   HPI Alan [name] is a 63 y.o. male past medical history of lung cancer currently undergoing chemo and radiation and recent admission with SVT returns to the emergency department with SVT symptoms.  Patient states that he was eating at the time began to feel heart palpitations.  He felt his pulse going very fast and called EMS.  They arrived to find him in SVT and administered  6 mg followed by 12 mg of adenosine with conversion to normal sinus rhythm.  The patient did not experience any chest pain, lightheadedness, dizziness.  He did experience some mild dyspnea and states this is very similar to his last presentation 1 week ago.  After discharge from the hospital he was started on Toporol XL 25 mg and is not experiencing significant side effects.   Past Medical History:  Diagnosis Date  . CAD S/P percutaneous coronary angioplasty 2004   which showed essentially normal LV size and function, moderately dilated left atrium, moderate mitral prolapse with mild to moderate regurgitation.  . Dyslipidemia, goal LDL below 70   . Essential hypertension   . GERD (gastroesophageal reflux disease)   . History of Mitral valve prolapse    Moderate - with moderate MR, noted February t 2013  . History of Severe mitral regurgitation by prior echocardiogram 02/06/2014   TEE: Severe mitral regurgitation with a flail P2 segment and ruptured; Normal LV size & function, dilated LA.  Marland Kitchen History of stress test 12/2009   he walked 9 mins reaching 10 METS. There was an attenuation artifact in the inferior region but no ischemia or infaract, low risk.  . Incidental lung nodule, greater than or equal to 68mm 03/16/2014   Ground glass opacity RML noted on CT scan  . PONV (postoperative nausea and vomiting)   . 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 ST elevation myocardial infarction (STEMI) of anterior wall, subsequent episode of care [facility]) 2004   he had a proximal LAD occlusion treated with 2 overlapping 3.5 x 1.8 mm Cypher DES stents.    Patient Active Problem List   Diagnosis Date Noted  . SVT (supraventricular tachycardia) ([place]) 07/01/2019  . Elevated troponin level 07/01/2019  . Adenocarcinoma of right lung, stage 3 ([place]) 05/29/2019  . Goals of care, counseling/discussion 05/29/2019  . Encounter for antineoplastic chemotherapy 05/29/2019  . Lung mass   . S/P minimally invasive mitral valve repair 04/08/2014  . Incidental lung nodule, greater than or equal to 45mm 03/16/2014  . CAD S/P percutaneous coronary angioplasty - DES PCI to occluded LAD during anterior STEMI 11/29/2013  . History of Severe mitral regurgitation 11/29/2013  . History of Severe Mitral valve prolapse - severe prolapse of the posterior (P2) leaflet with severe MR 11/29/2013  . Essential hypertension   . Dyslipidemia, goal LDL below 70   . METATARSALGIA 03/24/2009    Past Surgical History:  Procedure Laterality Date  . ANTERIOR CRUCIATE LIGAMENT REPAIR Left 1993  . CARDIAC CATHETERIZATION  2004   he had a proximal LAD occlusion treated with 2 overlapping 3.5 x 1.8 mm Cypher DES stents.  Marland Kitchen CARDIAC CATHETERIZATION  2007; 03/2014   Widely patent LAD stents, 40% distal LAD, 30% Cx, ~50% PL-PDA bifurcation lesion   . COLONOSCOPY    . INTRAOPERATIVE TRANSESOPHAGEAL ECHOCARDIOGRAM N/A 04/08/2014   Procedure: INTRAOPERATIVE TRANSESOPHAGEAL ECHOCARDIOGRAM;  Surgeon: Rexene Alberts, MD;  Location: [place];  Service: Open Heart Surgery;  Laterality: N/A;  . LEFT AND RIGHT HEART CATHETERIZATION WITH CORONARY ANGIOGRAM  N/A 03/05/2014   Procedure: LEFT AND RIGHT HEART CATHETERIZATION WITH CORONARY ANGIOGRAM;  Surgeon: Leonie Man, MD;  Location: [facility] CATH LAB;  Service: Cardiovascular;  Laterality: N/A;  . LEFT HEART CATHETERIZATION WITH CORONARY ANGIOGRAM N/A 09/15/2011   Procedure: LEFT HEART CATHETERIZATION WITH CORONARY ANGIOGRAM;  Surgeon: Lorretta Harp, MD;  Location[facility] C[facility] LAB;  Service: Cardiovascular;  Laterality: N/A;  . MITRAL VALVE REPAIR Right 04/08/2014   Procedure: MINIMALLY INVASIVE MITRAL VALVE REPAIR (MVR);  Surgeon: Rexene Alberts, MD;  Location: [place];  Service: Open Heart Surgery;  Laterality: Right;  . NM MYOVIEW LTD  May 2011   Walk 9 minutes, and 10 METs, diaphragmatic attenuation but no ischemia or infarction.  . TEE WITHOUT CARDIOVERSION N/A 02/06/2014   Procedure: TRANSESOPHAGEAL ECHOCARDIOGRAM (TEE);  Surgeon: Pixie Casino, MD;  Normal LV Size U& function - EF 55-60%, no regional WMA.  MV P2 Leaflet is flail with ruptured chord with severe prolapse, anterior leaflet intact.  Severe, eccentric anterior directed MR with dilated LA.  Marland Kitchen TRANSTHORACIC ECHOCARDIOGRAM  07/18/2018   Normal LV size and function.  EF 50-60 %.  Unable to assess diastolic function.  Mitral valve sewing ring in place.  Mild stenosis with a gradient of 6 mmHg noted. -  Domingo Dimes ECHOCARDIOGRAM  June 30 2015   Low normal LV function with EF 50-55%. GR 1 DD. No MR. Normal gradient of 4 mmHg the mitral valve. No prolapse.  Marland Kitchen VIDEO BRONCHOSCOPY WITH ENDOBRONCHIAL ULTRASOUND N/A 05/16/2019   Procedure: VIDEO BRONCHOSCOPY WITH ENDOBRONCHIAL ULTRASOUND;  Surgeon: Marshell Garfinkel, MD;  Location: [place];  Service: Pulmonary;  Laterality: N/A;    Allergies A-cillin [ampicillin], Amoxicillin, Other, and Penicillins  Family History  Problem Relation Age of Onset  . Hypertension Mother   . Heart Problems Father        triple bypass 1989  . Cancer Paternal Grandmother        Pancreatic    Social History  Social History   Tobacco Use  . Smoking status: Never Smoker  . Smokeless tobacco: Never Used  Substance Use Topics  . Alcohol use: Yes    Alcohol/week: 10.0 standard drinks    Types: 5 Cans of beer, 5 Shots of liquor per week    Comment: social  . Drug use: No    Review of Systems  Constitutional: No fever/chills Eyes: No visual changes. ENT: No sore throat. Cardiovascular: Denies chest pain. Positive palpitations.  Respiratory: Positive shortness of breath. Gastrointestinal: No abdominal pain. Mild nausea, no vomiting.  No diarrhea.  No constipation. Genitourinary: Negative for dysuria. Musculoskeletal: Negative for back pain. Skin: Negative for rash. Neurological: Negative for headaches, focal weakness or numbness.  10-point ROS otherwise negative.  ____________________________________________   PHYSICAL EXAM:  VITAL SIGNS: ED Triage Vitals  Enc Vitals Group     BP 07/09/19 1733 130/89     Pulse Rate 07/09/19 1733 (!) 103     Resp 07/09/19 1733 18     Temp 07/09/19 1735 98 F (36.7 C)     Temp Source 07/09/19 1735 Oral  SpO2 07/09/19 1733 100 %     Weight 07/09/19 1731 177 lb (80.3 kg)     Height 07/09/19 1731 6\' 4"  (1.93 m)   Constitutional: Alert and oriented. Well appearing and in no acute distress. Eyes: Conjunctivae are normal. Head: Atraumatic. Nose: No congestion/rhinnorhea. Mouth/Throat: Mucous membranes are moist.   Neck: No stridor.   Cardiovascular: Normal rate, regular rhythm. Good peripheral circulation. Grossly normal heart sounds.   Respiratory: Normal respiratory effort.  No retractions. Lungs CTAB. Gastrointestinal: Soft and nontender. No distention.  Musculoskeletal: No gross deformities of extremities. Neurologic:  Normal speech and language.  Skin:  Skin is warm, dry and intact. No rash noted.   ____________________________________________   LABS (all labs ordered are listed, but only abnormal results are displayed)  Labs  Reviewed  BASIC METABOLIC PANEL - Abnormal; Notable for the following components:      Result Value   Glucose, Bld 107 (*)    All other components within normal limits  CBC WITH DIFFERENTIAL/PLATELET - Abnormal; Notable for the following components:   WBC 3.3 (*)    RBC 3.75 (*)    Hemoglobin 11.7 (*)    HCT 35.4 (*)    Platelets 118 (*)    Lymphs Abs 0.2 (*)    All other components within normal limits  MAGNESIUM  TSH   ____________________________________________  EKG interpretation:  Sinus rhythm. Normal axis. HR: 103. PR: 171. QTc: 460. Normal T waves. No STEMI.  ____________________________________________  RADIOLOGY  None  ____________________________________________   PROCEDURES  Procedure(s) performed:   Procedures  None  ____________________________________________   INITIAL IMPRESSION / ASSESSMENT AND PLAN / ED COURSE  Pertinent labs & imaging results that were available during my care of the patient were reviewed by me and considered in my medical decision making (see chart for details).   Patient presents emergency department with SVT recognized by EMS and cardioverted after 6 followed by 12 mg of adenosine.  Rhythm strips are at bedside for my review.  Patient is not experiencing any residual symptoms.  He never experienced chest pain.  Have ordered screening lab work and have added TSH.  Patient had more extensive work-up last time with some associated chest discomfort and uptrending troponin requiring observation admission.  I do not see an indication to have troponin on or obtain additional imaging at this time.  Plan for ED obs and likely discharge if patient maintains sinus rhythm with consideration of increasing his metoprolol.   Labs reviewed. No electrolyte abnormality. Spoke with Dr. Sallyanne Kuster with Cardiology. Agrees with increasing to Toporol XL 50 mg tab daily from 25 mg and call to schedule a follow up appointment with his Cardiologist.   07:55  PM  TSH is normal. No return to SVT in the ED. BP and HR are WNL here. Discussed med dosing change and called this in to the pharmacy. Discussed ED return precautions and Cardiology follow up plan. Patient pleased at discharge.  ____________________________________________  FINAL CLINICAL IMPRESSION(S) / ED DIAGNOSES  Final diagnoses:  SVT (supraventricular tachycardia) ([place])    MEDICATIONS GIVEN DURING THIS VISIT:  Medications  metoprolol tartrate (LOPRESSOR) tablet 25 mg (25 mg Oral Given 07/09/19 1931)   Note:  This document was prepared using Dragon voice recognition software and may include unintentional dictation errors.  Nanda Quinton, MD, [facility]    Macarena Langseth, [facility], MD 07/09/19 [phone]

## 2019-07-09 NOTE — ED Triage Notes (Signed)
Per ems, the pt was in SVT then the pt converted with 6 then 12 of adenosine. On arrival the pt down not report and nausea, chest pain, dizziness, or light headed.    134/88 102HR,  16rr 99o2 98.4*f  Initial hr was 200 w/ svt.

## 2019-07-09 NOTE — Discharge Instructions (Signed)
You were seen in the emergency department today with elevated heart rate.  I increasing the dose of your metoprolol after discussing this with a cardiologist.  If you develop lightheadedness, passing out, low heart rate (< 50 beats per minute) you should return to the emergency department for evaluation.  If you have elevated heart rate again you can try the vagal maneuvers discussed last time but if symptoms do not resolve he would have to call EMS.  Please call your cardiologist on Monday to schedule a follow-up appointment.

## 2019-07-14 ENCOUNTER — Inpatient Hospital Stay (HOSPITAL_BASED_OUTPATIENT_CLINIC_OR_DEPARTMENT_OTHER): Payer: Federal, State, Local not specified - PPO | Admitting: Internal Medicine

## 2019-07-14 ENCOUNTER — Ambulatory Visit
Admission: RE | Admit: 2019-07-14 | Discharge: 2019-07-14 | Disposition: A | Discharge status: Home / Self Care | Payer: Federal, State, Local not specified - PPO | Source: Ambulatory Visit | Attending: Radiation Oncology | Admitting: Radiation Oncology

## 2019-07-14 ENCOUNTER — Other Ambulatory Visit: Payer: Self-pay

## 2019-07-14 ENCOUNTER — Encounter: Payer: Self-pay | Admitting: Internal Medicine

## 2019-07-14 ENCOUNTER — Inpatient Hospital Stay: Payer: Federal, State, Local not specified - PPO

## 2019-07-14 ENCOUNTER — Telehealth: Payer: Self-pay

## 2019-07-14 DIAGNOSIS — C3490 Malignant neoplasm of unspecified part of unspecified bronchus or lung: Secondary | ICD-10-CM

## 2019-07-14 DIAGNOSIS — C3431 Malignant neoplasm of lower lobe, right bronchus or lung: Secondary | ICD-10-CM | POA: Diagnosis not present

## 2019-07-14 DIAGNOSIS — Z510 Encounter for antineoplastic radiation therapy: Secondary | ICD-10-CM | POA: Diagnosis not present

## 2019-07-14 DIAGNOSIS — C3491 Malignant neoplasm of unspecified part of right bronchus or lung: Secondary | ICD-10-CM

## 2019-07-14 LAB — CMP (CANCER CENTER ONLY)
ALT: 17 U/L (ref 0–44)
AST: 17 U/L (ref 15–41)
Albumin: 3.9 g/dL (ref 3.5–5.0)
Alkaline Phosphatase: 76 U/L (ref 38–126)
Anion gap: 8 (ref 5–15)
BUN: 16 mg/dL (ref 8–23)
CO2: 27 mmol/L (ref 22–32)
Calcium: 9.2 mg/dL (ref 8.9–10.3)
Chloride: 105 mmol/L (ref 98–111)
Creatinine: 0.8 mg/dL (ref 0.61–1.24)
GFR, Est AFR Am: >60 mL/min (ref >60)
GFR, Estimated: >60 mL/min (ref >60)
Glucose, Bld: 97 mg/dL (ref 70–99)
Potassium: 4.3 mmol/L (ref 3.5–5.1)
Sodium: 140 mmol/L (ref 135–145)
Total Bilirubin: 0.9 mg/dL (ref 0.3–1.2)
Total Protein: 6.6 g/dL (ref 6.5–8.1)

## 2019-07-14 LAB — CBC WITH DIFFERENTIAL (CANCER CENTER ONLY)
Abs Immature Granulocytes: 0.03 10*3/uL (ref 0.00–0.07)
Basophils Absolute: 0 10*3/uL (ref 0.0–0.1)
Basophils Relative: 1 %
Eosinophils Absolute: 0 10*3/uL (ref 0.0–0.5)
Eosinophils Relative: 1 %
HCT: 34.7 % — ABNORMAL LOW (ref 39.0–52.0)
Hemoglobin: 11.7 g/dL — ABNORMAL LOW (ref 13.0–17.0)
Immature Granulocytes: 1 %
Lymphocytes Relative: 15 %
Lymphs Abs: 0.4 10*3/uL — ABNORMAL LOW (ref 0.7–4.0)
MCH: 31.4 pg (ref 26.0–34.0)
MCHC: 33.7 g/dL (ref 30.0–36.0)
MCV: 93 fL (ref 80.0–100.0)
Monocytes Absolute: 0.2 10*3/uL (ref 0.1–1.0)
Monocytes Relative: 9 %
Neutro Abs: 1.8 10*3/uL (ref 1.7–7.7)
Neutrophils Relative %: 73 %
Platelet Count: 112 10*3/uL — ABNORMAL LOW (ref 150–400)
RBC: 3.73 MIL/uL — ABNORMAL LOW (ref 4.22–5.81)
RDW: 13.6 % (ref 11.5–15.5)
WBC Count: 2.5 10*3/uL — ABNORMAL LOW (ref 4.0–10.5)
nRBC: 0 % (ref 0.0–0.2)

## 2019-07-14 MED ORDER — SODIUM CHLORIDE 0.9 % IV SOLN
20.0000 mg | Freq: Once | INTRAVENOUS | Status: AC
  Administered 2019-07-14: 20 mg via INTRAVENOUS
  Filled 2019-07-14: qty 2

## 2019-07-14 MED ORDER — FAMOTIDINE IN NACL 20-0.9 MG/50ML-% IV SOLN
INTRAVENOUS | Status: AC
  Filled 2019-07-14: qty 50

## 2019-07-14 MED ORDER — PALONOSETRON HCL INJECTION 0.25 MG/5ML
INTRAVENOUS | Status: AC
  Filled 2019-07-14: qty 5

## 2019-07-14 MED ORDER — PALONOSETRON HCL INJECTION 0.25 MG/5ML
0.2500 mg | Freq: Once | INTRAVENOUS | Status: AC
  Administered 2019-07-14: 0.25 mg via INTRAVENOUS

## 2019-07-14 MED ORDER — FAMOTIDINE IN NACL 20-0.9 MG/50ML-% IV SOLN
20.0000 mg | Freq: Once | INTRAVENOUS | Status: AC
  Administered 2019-07-14: 20 mg via INTRAVENOUS

## 2019-07-14 MED ORDER — SODIUM CHLORIDE 0.9 % IV SOLN
45.0000 mg/m2 | Freq: Once | INTRAVENOUS | Status: AC
  Administered 2019-07-14: 96 mg via INTRAVENOUS
  Filled 2019-07-14: qty 16

## 2019-07-14 MED ORDER — SODIUM CHLORIDE 0.9 % IV SOLN
Freq: Once | INTRAVENOUS | Status: AC
  Administered 2019-07-14: 13:00:00 via INTRAVENOUS
  Filled 2019-07-14: qty 250

## 2019-07-14 MED ORDER — SODIUM CHLORIDE 0.9 % IV SOLN
276.4000 mg | Freq: Once | INTRAVENOUS | Status: AC
  Administered 2019-07-14: 280 mg via INTRAVENOUS
  Filled 2019-07-14: qty 28

## 2019-07-14 NOTE — Progress Notes (Signed)
[place] [phone]   [phone]  OFFICE PROGRESS NOTE  [name], L.[name], [place] [address] [place] [address]  DIAGNOSIS: stage IIIb (T2b, N3, M0) non-small cell lung cancer, adenocarcinoma presented with right lower lobe lung nodule in addition to right hilar mass/node as well as ipsilateral and contralateral mediastinal lymphadenopathy diagnosed in October 2020.  Molecular studies by [facility] showed no actionable mutations.  PRIOR THERAPY:None.  CURRENT THERAPY: Concurrent chemoradiation with weekly carboplatin for AUC of 2 and paclitaxel 45 mg/M2.  Status post 5 cycles.  INTERVAL HISTORY: Alan [name] 63 y.o. male returns to the clinic today for follow-up visit.  The patient is currently undergoing concurrent chemoradiation with weekly carboplatin and paclitaxel and has been tolerating this treatment well with no concerning adverse effects. He denied having any current chest pain but has some mild shortness of breath with exertion with no cough or hemoptysis.  He denied having any fever or chills.  He has no nausea, vomiting, diarrhea or constipation.  He denied having any current dysphagia or odynophagia.  He is here today for evaluation before starting cycle #6 of his treatment.    MEDICAL HISTORY: Past Medical History:  Diagnosis Date  . CAD S/P percutaneous coronary angioplasty 2004   which showed essentially normal LV size and function, moderately dilated left atrium, moderate mitral prolapse with mild to moderate regurgitation.  . Dyslipidemia, goal LDL below 70   . Essential hypertension   . GERD (gastroesophageal reflux disease)   . History of Mitral valve prolapse    Moderate - with moderate MR, noted February t 2013  . History of Severe mitral regurgitation by prior echocardiogram 02/06/2014   TEE: Severe mitral regurgitation with a flail P2 segment and ruptured; Normal LV size & function, dilated LA.   Marland Kitchen History of stress test 12/2009   he walked 9 mins reaching 10 METS. There was an attenuation artifact in the inferior region but no ischemia or infaract, low risk.  . Incidental lung nodule, greater than or equal to 39mm 03/16/2014   Ground glass opacity RML noted on CT scan  . PONV (postoperative nausea and vomiting)   . 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 ST elevation myocardial infarction (STEMI) of anterior wall, subsequent episode of care [facility]) 2004   he had a proximal LAD occlusion treated with 2 overlapping 3.5 x 1.8 mm Cypher DES stents.    ALLERGIES:  is allergic to a-cillin [ampicillin]; amoxicillin; other; and penicillins.  MEDICATIONS:  Current Outpatient Medications  Medication Sig Dispense Refill  . Ascorbic Acid (VITAMIN C) 1000 MG tablet Take 2,000 mg by mouth daily.     . Coenzyme Q10 (CO Q 10) 100 MG CAPS Take 100 mg by mouth daily.     . metoprolol succinate (TOPROL-XL) 50 MG 24 hr tablet Take 1 tablet (50 mg total) by mouth daily. 30 tablet 0  . Probiotic Product (PROBIOTIC DAILY PO) Take 1 capsule by mouth daily.     . prochlorperazine (COMPAZINE) 10 MG tablet Take 1 tablet (10 mg total) by mouth every 6 (six) hours as needed for nausea or vomiting. 30 tablet 0  . Specialty Vitamins Products (PROSTATE PO) Take 1 capsule by  mouth daily. PROSTATE FORMULA    . sucralfate (CARAFATE) 1 g tablet Take 1 tablet (1 g total) by mouth 4 (four) times daily -  with meals and at bedtime. crush and dissolve in 10 cc of water prior to consuming 120 tablet 1  . Vitamin D-Vitamin K (D3 + K2 DOTS PO) Take 1 tablet by mouth daily.    Marland Kitchen zolpidem (AMBIEN) 5 MG tablet TAKE 1 TABLET BY MOUTH EVERY NIGHT AT BEDTIME AS  NEEDED FOR SLEEP (Patient taking differently: Take 5 mg by mouth at bedtime as needed for sleep. ) 90 tablet 2   No current facility-administered medications for this visit.     SURGICAL HISTORY:  Past Surgical History:  Procedure Laterality Date  . ANTERIOR CRUCIATE LIGAMENT REPAIR Left 1993  . CARDIAC CATHETERIZATION  2004   he had a proximal LAD occlusion treated with 2 overlapping 3.5 x 1.8 mm Cypher DES stents.  Marland Kitchen CARDIAC CATHETERIZATION  2007; 03/2014   Widely patent LAD stents, 40% distal LAD, 30% Cx, ~50% PL-PDA bifurcation lesion   . COLONOSCOPY    . INTRAOPERATIVE TRANSESOPHAGEAL ECHOCARDIOGRAM N/A 04/08/2014   Procedure: INTRAOPERATIVE TRANSESOPHAGEAL ECHOCARDIOGRAM;  Surgeon: Rexene Alberts, MD;  Location: [facility];  Service: Open Heart Surgery;  Laterality: N/A;  . LEFT AND RIGHT HEART CATHETERIZATION WITH CORONARY ANGIOGRAM N/A 03/05/2014   Procedure: LEFT AND RIGHT HEART CATHETERIZATION WITH CORONARY ANGIOGRAM;  Surgeon: Leonie Man, MD;  Location: [facility] CATH LAB;  Service: Cardiovascular;  Laterality: N/A;  . LEFT HEART CATHETERIZATION WITH CORONARY ANGIOGRAM N/A 09/15/2011   Procedure: LEFT HEART CATHETERIZATION WITH CORONARY ANGIOGRAM;  Surgeon: Lorretta Harp, MD;  Location[facility] CATH LAB;  Service: Cardiovascular;  Laterality: N/A;  . MITRAL VALVE REPAIR Right 04/08/2014   Procedure: MINIMALLY INVASIVE MITRAL VALVE REPAIR (MVR);  Surgeon: Rexene Alberts, MD;  Location: [facility];  Service: Open Heart Surgery;  Laterality: Right;  . NM MYOVIEW LTD  May 2011   Walk 9 minutes, and 10 METs, diaphragmatic attenuation but no ischemia or infarction.  . TEE WITHOUT CARDIOVERSION N/A 02/06/2014   Procedure: TRANSESOPHAGEAL ECHOCARDIOGRAM (TEE);  Surgeon: Pixie Casino, MD;  Normal LV Size U& function - EF 55-60%, no regional WMA.  MV P2 Leaflet is flail with ruptured chord with severe prolapse, anterior leaflet intact.  Severe, eccentric anterior directed MR with dilated LA.  Marland Kitchen  TRANSTHORACIC ECHOCARDIOGRAM  07/18/2018   Normal LV size and function.  EF 50-60 %.  Unable to assess diastolic function.  Mitral valve sewing ring in place.  Mild stenosis with a gradient of 6 mmHg noted. -  Domingo Dimes ECHOCARDIOGRAM  June 30 2015   Low normal LV function with EF 50-55%. GR 1 DD. No MR. Normal gradient of 4 mmHg the mitral valve. No prolapse.  Marland Kitchen VIDEO BRONCHOSCOPY WITH ENDOBRONCHIAL ULTRASOUND N/A 05/16/2019   Procedure: VIDEO BRONCHOSCOPY WITH ENDOBRONCHIAL ULTRASOUND;  Surgeon: Marshell Garfinkel, MD;  Location: [facility];  Service: Pulmonary;  Laterality: N/A;    REVIEW OF SYSTEMS:  A comprehensive review of systems was negative except for: Constitutional: positive for fatigue Respiratory: positive for dyspnea on exertion   PHYSICAL EXAMINATION: General appearance: alert, cooperative and no distress Head: Normocephalic, without obvious abnormality, atraumatic Neck: no adenopathy, no JVD, supple, symmetrical, trachea midline and thyroid not enlarged, symmetric, no tenderness/mass/nodules Lymph nodes: Cervical, supraclavicular, and axillary nodes normal. Resp: clear to auscultation bilaterally Back: symmetric, no curvature. ROM normal. No CVA tenderness. Cardio: regular rate and rhythm, S1, S2  normal, no murmur, click, rub or gallop GI: soft, non-tender; bowel sounds normal; no masses,  no organomegaly Extremities: extremities normal, atraumatic, no cyanosis or edema  ECOG PERFORMANCE STATUS: 1 - Symptomatic but completely ambulatory  Blood pressure 124/61, pulse 80, temperature 97.8 F (36.6 C), temperature source Oral, resp. rate 18, height 6\' 4"  (1.93 m), weight 180 lb 11.2 oz (82 kg), SpO2 98 %.  LABORATORY DATA: Lab Results  Component Value Date   WBC 2.5 (L) 07/14/2019   HGB 11.7 (L) 07/14/2019   HCT 34.7 (L) 07/14/2019   MCV 93.0 07/14/2019   PLT 112 (L) 07/14/2019      Chemistry      Component Value Date/Time   NA 140 07/14/2019 1047   K 4.3  07/14/2019 1047   CL 105 07/14/2019 1047   CO2 27 07/14/2019 1047   BUN 16 07/14/2019 1047   CREATININE 0.80 07/14/2019 1047   CREATININE 0.80 02/27/2014 1152      Component Value Date/Time   CALCIUM 9.2 07/14/2019 1047   ALKPHOS 76 07/14/2019 1047   AST 17 07/14/2019 1047   ALT 17 07/14/2019 1047   BILITOT 0.9 07/14/2019 1047       RADIOGRAPHIC STUDIES: Dg Chest 2 View  Result Date: 07/01/2019 CLINICAL DATA:  Tachycardia EXAM: CHEST - 2 VIEW COMPARISON:  05/16/2019, 04/18/2019, 07/26/2018, CT 04/30/2019 FINDINGS: Left lung is clear. No significant change in the appearance of right hilar opacity and vague right lower lobe opacity. Normal heart size. No pneumothorax. IMPRESSION: No acute interval change compared to prior radiograph from October. Known right hilar mass and lower lobe mass are better seen on CT. Similar appearance of streaky right lower lobe opacities. Electronically Signed   By: Donavan Foil M.D.   On: 07/01/2019 18:20   Ct Angio Chest Pe W And/or Wo Contrast  Result Date: 07/01/2019 CLINICAL DATA:  Dizziness, shortness of breath. History of lung cancer and radiation treatment EXAM: CT ANGIOGRAPHY CHEST WITH CONTRAST TECHNIQUE: Multidetector CT imaging of the chest was performed using the standard protocol during bolus administration of intravenous contrast. Multiplanar CT image reconstructions and MIPs were obtained to evaluate the vascular anatomy. CONTRAST:  50mL OMNIPAQUE IOHEXOL 350 MG/ML SOLN COMPARISON:  04/30/2019 FINDINGS: Cardiovascular: No filling defects in the pulmonary arteries to suggestpulmonary emboli. Heart is normal size. Aorta is normal caliber. Coronary artery and aortic calcifications. Mediastinum/Nodes: Enlarged anterior mediastinal/prevascular lymph node measuring up to 15 mm compared to 11 mm previously. Right hilar/infrahilar mass encases the right lower lobe pulmonary artery severely narrowing it. This is stable since prior study. This narrows the  right middle lobe bronchus. Lungs/Pleura: Right hilar/infrahilar mass again noted as seen on prior study, stable. Areas of atelectasis in the right middle lobe, likely postobstructive. Peripheral opacities in the right lower lobe also again noted, stable, likely atelectasis. No effusions. Left lung clear. Upper Abdomen: Imaging into the upper abdomen shows no acute findings. Musculoskeletal: Chest wall soft tissues are unremarkable. No acute bony abnormality. Review of the MIP images confirms the above findings. IMPRESSION: No evidence of pulmonary embolus. Coronary artery disease. Stable right hilar/infrahilar mass with narrowing of the right lower lobe pulmonary artery, right middle lobe bronchus and right bronchus intermedius. Areas of atelectasis in the right middle lobe and lower lobe are unchanged. Overall appearance is stable. Mediastinal adenopathy. Prevascular lymph node measures slightly larger than prior study. Aortic Atherosclerosis (ICD10-I70.0). Electronically Signed   By: Rolm Baptise M.D.   On: 07/01/2019 22:38    ASSESSMENT  AND PLAN: This is a very pleasant 63 years old white male recently diagnosed with a stage IIIb non-small cell lung cancer, adenocarcinoma with no actionable mutation.  He is currently undergoing a course of concurrent chemoradiation with weekly carboplatin and paclitaxel status post 5 cycles.   The patient continues to tolerate this treatment well with no concerning adverse effects. I recommended for him to proceed with cycle #6 today as planned. I will see him back for follow-up visit in 1 months for evaluation after repeating CT scan of the chest for restaging of his disease. The patient was advised to call immediately if he has any concerning symptoms in the interval. The patient voices understanding of current disease status and treatment options and is in agreement with the current care plan. All questions were answered. The patient knows to call the clinic with any  problems, questions or concerns. We can certainly see the patient much sooner if necessary.  I spent 10 minutes counseling the patient face to face. The total time spent in the appointment was 15 minutes.  Disclaimer: This note was dictated with voice recognition software. Similar sounding words can inadvertently be transcribed and may not be corrected upon review.

## 2019-07-14 NOTE — Telephone Encounter (Signed)
Returned pt's VM re: recent SVT. Per Dr. Sondra Come, XRT is for RUL, and is not near the heart. Pt continues to state that radiation is causing SVT episodes. This RN attempted to reassure pt that XRT is not near heart. Pt states that he will be at XRT. Pt states, "well, if I flatline, everyone will know why". This RN encouraged pt to make decision re: coming to XRT today with his safety in mind. Pt again stated, "I'll be moving forward with my treatments today and if I flatline, everyone is at least aware."   Dr. Sondra Come aware of conversation. Loma Sousa, RN BSN   Per Dr. Sondra Come, pt was offered for cardiac monitoring during XRT. Pt responds, "I appreciate the gesture but my a fib has always a couple hours after treatment so I think I'm good." Loma Sousa, RN BSN

## 2019-07-14 NOTE — Patient Instructions (Signed)
[facility] Discharge Instructions for Patients Receiving Chemotherapy  Today you received the following chemotherapy agents: Paclitaxel (Taxol) and Carboplatin (Paraplatin)  To help prevent nausea and vomiting after your treatment, we encourage you to take your nausea medication as directed.   If you develop nausea and vomiting that is not controlled by your nausea medication, call the clinic.   BELOW ARE SYMPTOMS THAT SHOULD BE REPORTED IMMEDIATELY:  *FEVER GREATER THAN 100.5 F  *CHILLS WITH OR WITHOUT FEVER  NAUSEA AND VOMITING THAT IS NOT CONTROLLED WITH YOUR NAUSEA MEDICATION  *UNUSUAL SHORTNESS OF BREATH  *UNUSUAL BRUISING OR BLEEDING  TENDERNESS IN MOUTH AND THROAT WITH OR WITHOUT PRESENCE OF ULCERS  *URINARY PROBLEMS  *BOWEL PROBLEMS  UNUSUAL RASH Items with * indicate a potential emergency and should be followed up as soon as possible.  Feel free to call the clinic should you have any questions or concerns. The clinic phone number is [phone].  Please show the [facility] at check-in to the Emergency Department and triage nurse.  Coronavirus (COVID-19) Are you at risk?  Are you at risk for the Coronavirus (COVID-19)?  To be considered HIGH RISK for Coronavirus (COVID-19), you have to meet the following criteria:  . Traveled to [place], [place], [place], [place] or [place]; or in the [place] to [place], [place], [place], or [place]; and have fever, cough, and shortness of breath within the last 2 weeks of travel OR . Been in close contact with a person diagnosed with COVID-19 within the last 2 weeks and have fever, cough, and shortness of breath . IF YOU DO NOT MEET THESE CRITERIA, YOU ARE CONSIDERED LOW RISK FOR COVID-19.  What to do if you are HIGH RISK for COVID-19?  Marland Kitchen If you are having a medical emergency, call 911. . Seek medical care right away. Before you go to a doctor's office, urgent care or emergency department,  call ahead and tell them about your recent travel, contact with someone diagnosed with COVID-19, and your symptoms. You should receive instructions from your physician's office regarding next steps of care.  . When you arrive at healthcare provider, tell the healthcare staff immediately you have returned from visiting [place], [place], [place], [place] or [place]; or traveled in the [place] to [place], [place], [place], or [place]; in the last two weeks or you have been in close contact with a person diagnosed with COVID-19 in the last 2 weeks.   . Tell the health care staff about your symptoms: fever, cough and shortness of breath. . After you have been seen by a medical provider, you will be either: o Tested for (COVID-19) and discharged home on quarantine except to seek medical care if symptoms worsen, and asked to  - Stay home and avoid contact with others until you get your results (4-5 days)  - Avoid travel on public transportation if possible (such as bus, train, or airplane) or o Sent to the Emergency Department by EMS for evaluation, COVID-19 testing, and possible admission depending on your condition and test results.  What to do if you are LOW RISK for COVID-19?  Reduce your risk of any infection by using the same precautions used for avoiding the common cold or flu:  Marland Kitchen Wash your hands often with soap and warm water for at least 20 seconds.  If soap and water are not readily available, use an alcohol-based hand sanitizer with at least 60% alcohol.  Marland Kitchen  If coughing or sneezing, cover your mouth and nose by coughing or sneezing into the elbow areas of your shirt or coat, into a tissue or into your sleeve (not your hands). . Avoid shaking hands with others and consider head nods or verbal greetings only. . Avoid touching your eyes, nose, or mouth with unwashed hands.  . Avoid close contact with people who are sick. . Avoid places or events with large numbers of people in one  location, like concerts or sporting events. . Carefully consider travel plans you have or are making. . If you are planning any travel outside or inside the [place], visit the CDC's Travelers' Health webpage for the latest health notices. . If you have some symptoms but not all symptoms, continue to monitor at home and seek medical attention if your symptoms worsen. . If you are having a medical emergency, call 911.   [facility] / e-Visit: [email or web address]         [facility]: [facility]: [phone]                   [facility]: [phone]

## 2019-07-14 NOTE — Progress Notes (Signed)
Cardiology Office Note   Date:  07/15/2019   ID:  Alan [name], DOB 11/21/1955, MRN [id number]  PCP:  Aurea Graff.[name], MD  Cardiologist: Dr. Ellyn Hack  No chief complaint on file.    History of Present Illness: Alan [name] is a 63 y.o. male who presents for post hospitalization follow up after admission for presyncope and SVT. He has a known history of CAD (LAD STEMI) , s/p PCI in 2018, Mitral valve repair, with other history to include Stage 3 lung CA, GERD.    He was ruled out for for PE and it was suspected that the SVT in the setting of lung CA. Mr Alan [name] reported stopped all of his medications on his own. He was recommended to follow up with cardiology for initiation of BB therapy if warranted.  He was started on Toprol XL 25 mg daily  Unfortunately returned to the ED on 07/09/2019 for rapid HR. He was also having associated dyspnea, He was found to be in SVT via EMS report and was converted using 6 mg of IV adenosine He was increased on Toprol XL to 50 mg daily.  Past Medical History:  Diagnosis Date  . CAD S/P percutaneous coronary angioplasty 2004   which showed essentially normal LV size and function, moderately dilated left atrium, moderate mitral prolapse with mild to moderate regurgitation.  . Dyslipidemia, goal LDL below 70   . Essential hypertension   . GERD (gastroesophageal reflux disease)   . History of Mitral valve prolapse    Moderate - with moderate MR, noted February t 2013  . History of Severe mitral regurgitation by prior echocardiogram 02/06/2014   TEE: Severe mitral regurgitation with a flail P2 segment and ruptured; Normal LV size & function, dilated LA.  Marland Kitchen History of stress test 12/2009   he walked 9 mins reaching 10 METS. There was an attenuation artifact in the inferior region but no ischemia or infaract, low risk.  . Incidental lung nodule, greater than or equal to 76mm 03/16/2014   Ground glass opacity RML noted on CT scan  . PONV (postoperative nausea  and vomiting)   . 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 ST elevation myocardial infarction (STEMI) of anterior wall, subsequent episode of care [facility]) 2004   he had a proximal LAD occlusion treated with 2 overlapping 3.5 x 1.8 mm Cypher DES stents.    Past Surgical History:  Procedure Laterality Date  . ANTERIOR CRUCIATE LIGAMENT REPAIR Left 1993  . CARDIAC CATHETERIZATION  2004   he had a proximal LAD occlusion treated with 2 overlapping 3.5 x 1.8 mm Cypher DES stents.  Marland Kitchen CARDIAC CATHETERIZATION  2007; 03/2014   Widely patent LAD stents, 40% distal LAD, 30% Cx, ~50% PL-PDA bifurcation lesion   . COLONOSCOPY    . INTRAOPERATIVE TRANSESOPHAGEAL ECHOCARDIOGRAM N/A 04/08/2014   Procedure: INTRAOPERATIVE TRANSESOPHAGEAL ECHOCARDIOGRAM;  Surgeon: Rexene Alberts, MD;  Location: [facility];  Service: Open Heart Surgery;  Laterality: N/A;  . LEFT AND RIGHT HEART CATHETERIZATION WITH CORONARY ANGIOGRAM N/A 03/05/2014   Procedure: LEFT AND RIGHT HEART CATHETERIZATION WITH CORONARY ANGIOGRAM;  Surgeon: Leonie Man, MD;  Location[facility] CATH LAB;  Service: Cardiovascular;  Laterality: N/A;  . LEFT HEART CATHETERIZATION WITH CORONARY ANGIOGRAM N/A 09/15/2011  Procedure: LEFT HEART CATHETERIZATION WITH CORONARY ANGIOGRAM;  Surgeon: Lorretta Harp, MD;  Location: [facility] CATH LAB;  Service: Cardiovascular;  Laterality: N/A;  . MITRAL VALVE REPAIR Right 04/08/2014   Procedure: MINIMALLY INVASIVE MITRAL VALVE REPAIR (MVR);  Surgeon: Rexene Alberts, MD;  Location: [facility];  Service: Open Heart Surgery;  Laterality: Right;  . NM MYOVIEW LTD  May 2011   Walk 9 minutes, and 10 METs, diaphragmatic attenuation but no ischemia or infarction.  . TEE  WITHOUT CARDIOVERSION N/A 02/06/2014   Procedure: TRANSESOPHAGEAL ECHOCARDIOGRAM (TEE);  Surgeon: Pixie Casino, MD;  Normal LV Size U& function - EF 55-60%, no regional WMA.  MV P2 Leaflet is flail with ruptured chord with severe prolapse, anterior leaflet intact.  Severe, eccentric anterior directed MR with dilated LA.  Marland Kitchen TRANSTHORACIC ECHOCARDIOGRAM  07/18/2018   Normal LV size and function.  EF 50-60 %.  Unable to assess diastolic function.  Mitral valve sewing ring in place.  Mild stenosis with a gradient of 6 mmHg noted. -  Domingo Dimes ECHOCARDIOGRAM  June 30 2015   Low normal LV function with EF 50-55%. GR 1 DD. No MR. Normal gradient of 4 mmHg the mitral valve. No prolapse.  Marland Kitchen VIDEO BRONCHOSCOPY WITH ENDOBRONCHIAL ULTRASOUND N/A 05/16/2019   Procedure: VIDEO BRONCHOSCOPY WITH ENDOBRONCHIAL ULTRASOUND;  Surgeon: Marshell Garfinkel, MD;  Location: [facility];  Service: Pulmonary;  Laterality: N/A;     Current Outpatient Medications  Medication Sig Dispense Refill  . Ascorbic Acid (VITAMIN C) 1000 MG tablet Take 2,000 mg by mouth daily.     . Coenzyme Q10 (CO Q 10) 100 MG CAPS Take 100 mg by mouth daily.     . metoprolol succinate (TOPROL-XL) 50 MG 24 hr tablet Take 1 tablet (50 mg total) by mouth daily. 30 tablet 0  . Probiotic Product (PROBIOTIC DAILY PO) Take 1 capsule by mouth daily.     . prochlorperazine (COMPAZINE) 10 MG tablet Take 1 tablet (10 mg total) by mouth every 6 (six) hours as needed for nausea or vomiting. 30 tablet 0  . Specialty Vitamins Products (PROSTATE PO) Take 1 capsule by mouth daily. PROSTATE FORMULA    . sucralfate (CARAFATE) 1 g tablet Take 1 tablet (1 g total) by mouth 4 (four) times daily -  with meals and at bedtime. crush and dissolve in 10 cc of water prior to consuming 120 tablet 1  . Vitamin D-Vitamin K (D3 + K2 DOTS PO) Take 1 tablet by mouth daily.    Marland Kitchen zolpidem (AMBIEN) 5 MG tablet TAKE 1 TABLET BY MOUTH EVERY NIGHT AT BEDTIME AS NEEDED FOR SLEEP  (Patient taking differently: Take 5 mg by mouth at bedtime as needed for sleep. ) 90 tablet 2   No current facility-administered medications for this visit.     Allergies:   A-cillin [ampicillin], Amoxicillin, Other, and Penicillins    Social History:  The patient  reports that he has never smoked. He has never used smokeless tobacco. He reports current alcohol use of about 10.0 standard drinks of alcohol per week. He reports that he does not use drugs.   Family History:  The patient's family history includes Cancer in his paternal grandmother; Heart Problems in his father; Hypertension in his mother.    ROS: All other systems are reviewed and negative. Unless otherwise mentioned in H&P    PHYSICAL EXAM: VS:  BP 90/64   Pulse 85   Ht 6\' 4"  (1.93 m)   Wt 185  lb (83.9 kg)   BMI 22.52 kg/m  , BMI Body mass index is 22.52 kg/m. GEN: Well nourished, well developed, in no acute distress HEENT: normal Neck: no JVD, carotid bruits, or masses Cardiac: RRR; no murmurs, rubs, or gallops,no edema  Respiratory:  Clear to auscultation bilaterally, normal work of breathing GI: soft, nontender, nondistended, + BS MS: no deformity or atrophy Skin: warm and dry, no rash Neuro:  Strength and sensation are intact Psych: euthymic mood, full affect   EKG: Normal sinus rhythm, heart rate of 85 bpm.  Recent Labs: 07/09/2019: Magnesium 2.0; TSH 1.800 07/14/2019: ALT 17; BUN 16; Creatinine 0.80; Hemoglobin 11.7; Platelet Count 112; Potassium 4.3; Sodium 140    Lipid Panel No results found for: CHOL, TRIG, HDL, CHOLHDL, VLDL, LDLCALC, LDLDIRECT    Wt Readings from Last 3 Encounters:  07/15/19 185 lb (83.9 kg)  07/14/19 180 lb 11.2 oz (82 kg)  07/09/19 177 lb (80.3 kg)      Other studies Reviewed: Echocardiogram 2018-08-12 Left ventricle: The cavity size was normal. Systolic function was normal. The estimated ejection fraction was in the range of 50% to 55%. Wall motion was normal; there  were no regional wall motion abnormalities. The study is indeterminate for the evaluation of LV diastolic function. - Mitral valve: Prior procedures included surgical repair. The   sewing ring appeared normal. The findings are consistent with   mild stenosis. There was trivial regurgitation. Mean gradient   (D): 6 mm Hg.   ASSESSMENT AND PLAN:  1.  PSVT: He has had 2 visits to the ED due to rapid heart rhythm.  He was initially on metoprolol succinate 25 mg daily but has been increased to metoprolol succinate 50 mg daily.  He appears to be tolerating this well and has not had any further recurrences of SVT.  It was thought this was related to his radiation therapy.  EKG did they reveals normal sinus rhythm with a heart rate of 85 bpm, no evidence of prolonged QT interval or arrhythmias.  Continue current dose.  We will follow-up with Dr. Ellyn Hack in February 2021 as he will have completed his radiation therapy and have had follow-up with oncology at that time to discuss further treatment plans.  For now keep him on current dose with titration recommendations on follow-up.  2.  Coronary artery disease: Status post PCI to the LAD in 2018.  No complaints of discomfort.  We will follow-up with echocardiogram in January 2021 for ongoing assessment of his LV function status post radiation/chemo therapy   3.  Dyslipidemia: Currently not on statin therapy.  Labs were recently completed by [facility] physicians.  Will request copies.  4.  Lung CA: Followed by oncology with continued treatments with radiation through December 2020.  He is due to have a follow-up PET scan thereafter.   Current medicines are reviewed at length with the patient today.    Labs/ tests ordered today include: None  Phill Myron. West Pugh, ANP, AACC   07/15/2019 2:00 PM    [address] Office [phone] Fax [phone]  Notice: This dictation was prepared with Dragon  dictation along with smaller phrase technology. Any transcriptional errors that result from this process are unintentional and may not be corrected upon review.

## 2019-07-15 ENCOUNTER — Ambulatory Visit
Admission: RE | Admit: 2019-07-15 | Discharge: 2019-07-15 | Disposition: A | Discharge status: Home / Self Care | Payer: Federal, State, Local not specified - PPO | Source: Ambulatory Visit | Attending: Radiation Oncology | Admitting: Radiation Oncology

## 2019-07-15 ENCOUNTER — Encounter: Payer: Self-pay | Admitting: Adult Health

## 2019-07-15 ENCOUNTER — Ambulatory Visit: Payer: Federal, State, Local not specified - PPO | Admitting: Adult Health

## 2019-07-15 ENCOUNTER — Other Ambulatory Visit: Payer: Self-pay

## 2019-07-15 VITALS — BP 90/64 | HR 85 | Ht 76.0 in | Wt 185.0 lb

## 2019-07-15 DIAGNOSIS — I10 Essential (primary) hypertension: Secondary | ICD-10-CM | POA: Diagnosis not present

## 2019-07-15 DIAGNOSIS — Z510 Encounter for antineoplastic radiation therapy: Secondary | ICD-10-CM | POA: Insufficient documentation

## 2019-07-15 DIAGNOSIS — I2510 Atherosclerotic heart disease of native coronary artery without angina pectoris: Secondary | ICD-10-CM | POA: Diagnosis not present

## 2019-07-15 DIAGNOSIS — I340 Nonrheumatic mitral (valve) insufficiency: Secondary | ICD-10-CM | POA: Diagnosis not present

## 2019-07-15 DIAGNOSIS — C3491 Malignant neoplasm of unspecified part of right bronchus or lung: Secondary | ICD-10-CM

## 2019-07-15 DIAGNOSIS — Z9861 Coronary angioplasty status: Secondary | ICD-10-CM

## 2019-07-15 DIAGNOSIS — I471 Supraventricular tachycardia: Secondary | ICD-10-CM

## 2019-07-15 DIAGNOSIS — I341 Nonrheumatic mitral (valve) prolapse: Secondary | ICD-10-CM | POA: Diagnosis not present

## 2019-07-15 DIAGNOSIS — E785 Hyperlipidemia, unspecified: Secondary | ICD-10-CM

## 2019-07-15 NOTE — Patient Instructions (Signed)
Medication Instructions:  Your physician recommends that you continue on your current medications as directed. Please refer to the Current Medication list given to you today.  *If you need a refill on your cardiac medications before your next appointment, please call your pharmacy*   Testing/Procedures: Your physician has requested that you have an echocardiogram in January 2021. Echocardiography is a painless test that uses sound waves to create images of your heart. It provides your doctor with information about the size and shape of your heart and how well your heart's chambers and valves are working. This procedure takes approximately one hour. There are no restrictions for this procedure.  Follow-Up: At [facility], you and your health needs are our priority.  As part of our continuing mission to provide you with exceptional heart care, we have created designated Provider Care Teams.  These Care Teams include your primary Cardiologist (physician) and Advanced Practice Providers (APPs -  Physician Assistants and Nurse Practitioners) who all work together to provide you with the care you need, when you need it.  Your next appointment:   February or March   The format for your next appointment:   In Person  Provider:   Glenetta Hew, MD

## 2019-07-16 ENCOUNTER — Ambulatory Visit
Admission: RE | Admit: 2019-07-16 | Discharge: 2019-07-16 | Disposition: A | Discharge status: Home / Self Care | Payer: Federal, State, Local not specified - PPO | Source: Ambulatory Visit | Attending: Radiation Oncology | Admitting: Radiation Oncology

## 2019-07-16 ENCOUNTER — Other Ambulatory Visit: Payer: Self-pay

## 2019-07-16 DIAGNOSIS — Z510 Encounter for antineoplastic radiation therapy: Secondary | ICD-10-CM | POA: Diagnosis not present

## 2019-07-17 ENCOUNTER — Other Ambulatory Visit: Payer: Self-pay

## 2019-07-17 ENCOUNTER — Ambulatory Visit
Admission: RE | Admit: 2019-07-17 | Discharge: 2019-07-17 | Disposition: A | Discharge status: Home / Self Care | Payer: Federal, State, Local not specified - PPO | Source: Ambulatory Visit | Attending: Radiation Oncology | Admitting: Radiation Oncology

## 2019-07-17 DIAGNOSIS — Z510 Encounter for antineoplastic radiation therapy: Secondary | ICD-10-CM | POA: Diagnosis not present

## 2019-07-18 ENCOUNTER — Other Ambulatory Visit: Payer: Self-pay

## 2019-07-18 ENCOUNTER — Ambulatory Visit
Admission: RE | Admit: 2019-07-18 | Discharge: 2019-07-18 | Disposition: A | Discharge status: Home / Self Care | Payer: Federal, State, Local not specified - PPO | Source: Ambulatory Visit | Attending: Radiation Oncology | Admitting: Radiation Oncology

## 2019-07-18 DIAGNOSIS — Z510 Encounter for antineoplastic radiation therapy: Secondary | ICD-10-CM | POA: Diagnosis not present

## 2019-07-21 ENCOUNTER — Inpatient Hospital Stay: Payer: Federal, State, Local not specified - PPO

## 2019-07-21 ENCOUNTER — Other Ambulatory Visit: Payer: Self-pay

## 2019-07-21 ENCOUNTER — Ambulatory Visit: Payer: Federal, State, Local not specified - PPO

## 2019-07-21 ENCOUNTER — Ambulatory Visit
Admission: RE | Admit: 2019-07-21 | Discharge: 2019-07-21 | Disposition: A | Discharge status: Home / Self Care | Payer: Federal, State, Local not specified - PPO | Source: Ambulatory Visit | Attending: Radiation Oncology | Admitting: Radiation Oncology

## 2019-07-21 ENCOUNTER — Inpatient Hospital Stay: Payer: Federal, State, Local not specified - PPO | Attending: Internal Medicine

## 2019-07-21 VITALS — BP 113/78 | HR 84 | Temp 98.1°F | Resp 18

## 2019-07-21 DIAGNOSIS — C3431 Malignant neoplasm of lower lobe, right bronchus or lung: Secondary | ICD-10-CM | POA: Diagnosis present

## 2019-07-21 DIAGNOSIS — Z5111 Encounter for antineoplastic chemotherapy: Secondary | ICD-10-CM | POA: Insufficient documentation

## 2019-07-21 DIAGNOSIS — C3491 Malignant neoplasm of unspecified part of right bronchus or lung: Secondary | ICD-10-CM

## 2019-07-21 DIAGNOSIS — Z510 Encounter for antineoplastic radiation therapy: Secondary | ICD-10-CM | POA: Diagnosis not present

## 2019-07-21 DIAGNOSIS — C771 Secondary and unspecified malignant neoplasm of intrathoracic lymph nodes: Secondary | ICD-10-CM | POA: Insufficient documentation

## 2019-07-21 LAB — CMP (CANCER CENTER ONLY)
ALT: 15 U/L (ref 0–44)
AST: 16 U/L (ref 15–41)
Albumin: 3.8 g/dL (ref 3.5–5.0)
Alkaline Phosphatase: 71 U/L (ref 38–126)
Anion gap: 8 (ref 5–15)
BUN: 14 mg/dL (ref 8–23)
CO2: 26 mmol/L (ref 22–32)
Calcium: 9.1 mg/dL (ref 8.9–10.3)
Chloride: 107 mmol/L (ref 98–111)
Creatinine: 0.76 mg/dL (ref 0.61–1.24)
GFR, Est AFR Am: >60 mL/min (ref >60)
GFR, Estimated: >60 mL/min (ref >60)
Glucose, Bld: 100 mg/dL — ABNORMAL HIGH (ref 70–99)
Potassium: 3.8 mmol/L (ref 3.5–5.1)
Sodium: 141 mmol/L (ref 135–145)
Total Bilirubin: 0.6 mg/dL (ref 0.3–1.2)
Total Protein: 6.9 g/dL (ref 6.5–8.1)

## 2019-07-21 LAB — CBC WITH DIFFERENTIAL (CANCER CENTER ONLY)
Abs Immature Granulocytes: 0.02 10*3/uL (ref 0.00–0.07)
Basophils Absolute: 0 10*3/uL (ref 0.0–0.1)
Basophils Relative: 0 %
Eosinophils Absolute: 0 10*3/uL (ref 0.0–0.5)
Eosinophils Relative: 1 %
HCT: 32.7 % — ABNORMAL LOW (ref 39.0–52.0)
Hemoglobin: 11 g/dL — ABNORMAL LOW (ref 13.0–17.0)
Immature Granulocytes: 1 %
Lymphocytes Relative: 11 %
Lymphs Abs: 0.3 10*3/uL — ABNORMAL LOW (ref 0.7–4.0)
MCH: 31.5 pg (ref 26.0–34.0)
MCHC: 33.6 g/dL (ref 30.0–36.0)
MCV: 93.7 fL (ref 80.0–100.0)
Monocytes Absolute: 0.3 10*3/uL (ref 0.1–1.0)
Monocytes Relative: 10 %
Neutro Abs: 2.3 10*3/uL (ref 1.7–7.7)
Neutrophils Relative %: 77 %
Platelet Count: 139 10*3/uL — ABNORMAL LOW (ref 150–400)
RBC: 3.49 MIL/uL — ABNORMAL LOW (ref 4.22–5.81)
RDW: 14 % (ref 11.5–15.5)
WBC Count: 2.9 10*3/uL — ABNORMAL LOW (ref 4.0–10.5)
nRBC: 0 % (ref 0.0–0.2)

## 2019-07-21 MED ORDER — SODIUM CHLORIDE 0.9 % IV SOLN
Freq: Once | INTRAVENOUS | Status: AC
  Administered 2019-07-21: 13:00:00 via INTRAVENOUS
  Filled 2019-07-21: qty 250

## 2019-07-21 MED ORDER — FAMOTIDINE IN NACL 20-0.9 MG/50ML-% IV SOLN
INTRAVENOUS | Status: AC
  Filled 2019-07-21: qty 50

## 2019-07-21 MED ORDER — SODIUM CHLORIDE 0.9 % IV SOLN
45.0000 mg/m2 | Freq: Once | INTRAVENOUS | Status: AC
  Administered 2019-07-21: 96 mg via INTRAVENOUS
  Filled 2019-07-21: qty 16

## 2019-07-21 MED ORDER — SODIUM CHLORIDE 0.9 % IV SOLN
276.4000 mg | Freq: Once | INTRAVENOUS | Status: AC
  Administered 2019-07-21: 16:00:00 280 mg via INTRAVENOUS
  Filled 2019-07-21: qty 28

## 2019-07-21 MED ORDER — SODIUM CHLORIDE 0.9 % IV SOLN
20.0000 mg | Freq: Once | INTRAVENOUS | Status: AC
  Administered 2019-07-21: 14:00:00 20 mg via INTRAVENOUS
  Filled 2019-07-21: qty 20

## 2019-07-21 MED ORDER — DIPHENHYDRAMINE HCL 50 MG/ML IJ SOLN
INTRAMUSCULAR | Status: AC
  Filled 2019-07-21: qty 1

## 2019-07-21 MED ORDER — DIPHENHYDRAMINE HCL 50 MG/ML IJ SOLN: 25.0000 mg | Freq: Once | INTRAMUSCULAR | Status: DC

## 2019-07-21 MED ORDER — FAMOTIDINE IN NACL 20-0.9 MG/50ML-% IV SOLN
20.0000 mg | Freq: Once | INTRAVENOUS | Status: AC
  Administered 2019-07-21: 20 mg via INTRAVENOUS

## 2019-07-21 MED ORDER — PALONOSETRON HCL INJECTION 0.25 MG/5ML
INTRAVENOUS | Status: AC
  Filled 2019-07-21: qty 5

## 2019-07-21 MED ORDER — PALONOSETRON HCL INJECTION 0.25 MG/5ML
0.2500 mg | Freq: Once | INTRAVENOUS | Status: AC
  Administered 2019-07-21: 0.25 mg via INTRAVENOUS

## 2019-07-21 NOTE — Progress Notes (Unsigned)
Patient is requesting not to receive Benadryl this treatment.

## 2019-07-21 NOTE — Patient Instructions (Signed)
[facility] Discharge Instructions for Patients Receiving Chemotherapy  Today you received the following chemotherapy agents: Paclitaxel/Carboplatin  To help prevent nausea and vomiting after your treatment, we encourage you to take your nausea medication as directed.   If you develop nausea and vomiting that is not controlled by your nausea medication, call the clinic.   BELOW ARE SYMPTOMS THAT SHOULD BE REPORTED IMMEDIATELY:  *FEVER GREATER THAN 100.5 F  *CHILLS WITH OR WITHOUT FEVER  NAUSEA AND VOMITING THAT IS NOT CONTROLLED WITH YOUR NAUSEA MEDICATION  *UNUSUAL SHORTNESS OF BREATH  *UNUSUAL BRUISING OR BLEEDING  TENDERNESS IN MOUTH AND THROAT WITH OR WITHOUT PRESENCE OF ULCERS  *URINARY PROBLEMS  *BOWEL PROBLEMS  UNUSUAL RASH Items with * indicate a potential emergency and should be followed up as soon as possible.  Feel free to call the clinic should you have any questions or concerns. The clinic phone number is [phone].  Please show the [facility] at check-in to the Emergency Department and triage nurse.

## 2019-07-22 ENCOUNTER — Ambulatory Visit: Payer: Federal, State, Local not specified - PPO

## 2019-07-22 ENCOUNTER — Encounter: Payer: Self-pay | Admitting: Radiation Oncology

## 2019-07-22 ENCOUNTER — Ambulatory Visit
Admission: RE | Admit: 2019-07-22 | Discharge: 2019-07-22 | Disposition: A | Discharge status: Home / Self Care | Payer: Federal, State, Local not specified - PPO | Source: Ambulatory Visit | Attending: Radiation Oncology | Admitting: Radiation Oncology

## 2019-07-22 ENCOUNTER — Other Ambulatory Visit: Payer: Self-pay

## 2019-07-22 DIAGNOSIS — Z510 Encounter for antineoplastic radiation therapy: Secondary | ICD-10-CM | POA: Diagnosis not present

## 2019-07-23 ENCOUNTER — Ambulatory Visit: Payer: Federal, State, Local not specified - PPO

## 2019-07-23 NOTE — Progress Notes (Incomplete)
°  Patient Name: Alan [name] MRN: [id number] DOB: 12-14-1955 Referring Physician: Allison Quarry (Profile Not Attached) Date of Service: 07/22/2019 [facility], [place]                                                        End Of Treatment Note  Diagnoses: C34.31-Malignant neoplasm of lower lobe, right bronchus or lung  Cancer Staging: Stage IIIb (T2b, N3, M0) non-small cell lung cancer, adenocarcinoma presented with right lower lobe lung nodule in addition to right hilar mass/node as well as ipsilateral and contralateral mediastinal lymphadenopathy diagnosed in October 2020.  Intent: Curative  Radiation Treatment Dates: 06/09/2019 through 07/22/2019 Site Technique Total Dose (Gy) Dose per Fx (Gy) Completed Fx Beam Energies  Lung, Right: Lung_Rt 3D 60/60 2 30/30 6X, 15X   Narrative: The patient tolerated radiation therapy relatively well. During first week of treatment, patient reported dry cough, shortness of breath, lack of energy, and hoarse voice. However, all those symptoms had been present for months prior to the initiation of radiation therapy.  During second week of treatment, patient reported mild fatigue, shortness of breath with exertion, and constipation. However, he denied cough, chest pain, and difficulty swallowing.  During third week of treatment, patient reported nausea without vomiting, difficulty swallowing, shortness of breath with exertion, and rare cough with occasional clear/white sputum. Denied hemoptysis. Patient was prescribed Carafate for esophageal symptoms.  During fourth week of treatment, patient reported moderate to severe fatigue, dry cough, baseline shortness of breath, and difficulty swallowing. At that time, he stated that he was only taking the Carafate at bedtime. Denied  hemoptysis.  During fifth week, patient reported mild fatigue, dry cough with occasional clear mucus and shortness of breath with exertion. Swallowing became "tolerable". Denied hemoptysis.  During sixth and final week of treatment, patient reported acid reflux and pain in ears when swallowing. On final examination, patient had some minimal erythema and hyperpigmentation changes of the skin treatment area. No skin breakdown.  Plan: The patient will follow-up with radiation oncology in one month.  ________________________________________________   Blair Promise, PhD, MD  This document serves as a record of services personally performed by Gery Pray, MD. It was created on his behalf by Clerance Lav, a trained medical scribe. The creation of this record is based on the scribe's personal observations and the provider's statements to them. This document has been checked and approved by the attending provider.

## 2019-07-25 ENCOUNTER — Telehealth: Payer: Self-pay

## 2019-07-25 NOTE — Telephone Encounter (Signed)
Returned pt's VM. Pt with bothersome esophagitis. Strongly encouraged pt to alternate liquid APAP with liquid Ibuprofen. Also encouraged pt to take Carafate as prescribed instead of just at bedtime. Conveyed to pt that the goal would be to get ahead of pain instead of allowing pain to become unbearable. Pt verbalized understanding and agreement. Loma Sousa, RN BSN

## 2019-08-11 ENCOUNTER — Other Ambulatory Visit: Payer: Federal, State, Local not specified - PPO

## 2019-08-12 ENCOUNTER — Other Ambulatory Visit: Payer: Self-pay

## 2019-08-12 ENCOUNTER — Inpatient Hospital Stay: Payer: Federal, State, Local not specified - PPO

## 2019-08-12 ENCOUNTER — Encounter (HOSPITAL_COMMUNITY): Payer: Self-pay

## 2019-08-12 ENCOUNTER — Ambulatory Visit (HOSPITAL_COMMUNITY)
Admission: RE | Admit: 2019-08-12 | Discharge: 2019-08-12 | Disposition: A | Discharge status: Home / Self Care | Payer: Federal, State, Local not specified - PPO | Source: Ambulatory Visit | Attending: Internal Medicine | Admitting: Internal Medicine

## 2019-08-12 ENCOUNTER — Telehealth: Payer: Self-pay | Admitting: *Deleted

## 2019-08-12 DIAGNOSIS — C3490 Malignant neoplasm of unspecified part of unspecified bronchus or lung: Secondary | ICD-10-CM | POA: Insufficient documentation

## 2019-08-12 DIAGNOSIS — C3431 Malignant neoplasm of lower lobe, right bronchus or lung: Secondary | ICD-10-CM | POA: Diagnosis not present

## 2019-08-12 LAB — CBC WITH DIFFERENTIAL (CANCER CENTER ONLY)
Abs Immature Granulocytes: 0.03 10*3/uL (ref 0.00–0.07)
Basophils Absolute: 0 10*3/uL (ref 0.0–0.1)
Basophils Relative: 1 %
Eosinophils Absolute: 0.2 10*3/uL (ref 0.0–0.5)
Eosinophils Relative: 4 %
HCT: 32.3 % — ABNORMAL LOW (ref 39.0–52.0)
Hemoglobin: 10.5 g/dL — ABNORMAL LOW (ref 13.0–17.0)
Immature Granulocytes: 1 %
Lymphocytes Relative: 11 %
Lymphs Abs: 0.5 10*3/uL — ABNORMAL LOW (ref 0.7–4.0)
MCH: 31.7 pg (ref 26.0–34.0)
MCHC: 32.5 g/dL (ref 30.0–36.0)
MCV: 97.6 fL (ref 80.0–100.0)
Monocytes Absolute: 0.8 10*3/uL (ref 0.1–1.0)
Monocytes Relative: 18 %
Neutro Abs: 2.7 10*3/uL (ref 1.7–7.7)
Neutrophils Relative %: 65 %
Platelet Count: 213 10*3/uL (ref 150–400)
RBC: 3.31 MIL/uL — ABNORMAL LOW (ref 4.22–5.81)
RDW: 15 % (ref 11.5–15.5)
WBC Count: 4.2 10*3/uL (ref 4.0–10.5)
nRBC: 0 % (ref 0.0–0.2)

## 2019-08-12 LAB — CMP (CANCER CENTER ONLY)
ALT: 14 U/L (ref 0–44)
AST: 18 U/L (ref 15–41)
Albumin: 3.4 g/dL — ABNORMAL LOW (ref 3.5–5.0)
Alkaline Phosphatase: 71 U/L (ref 38–126)
Anion gap: 11 (ref 5–15)
BUN: 16 mg/dL (ref 8–23)
CO2: 25 mmol/L (ref 22–32)
Calcium: 9.1 mg/dL (ref 8.9–10.3)
Chloride: 104 mmol/L (ref 98–111)
Creatinine: 0.72 mg/dL (ref 0.61–1.24)
GFR, Est AFR Am: >60 mL/min (ref >60)
GFR, Estimated: >60 mL/min (ref >60)
Glucose, Bld: 91 mg/dL (ref 70–99)
Potassium: 3.9 mmol/L (ref 3.5–5.1)
Sodium: 140 mmol/L (ref 135–145)
Total Bilirubin: 0.6 mg/dL (ref 0.3–1.2)
Total Protein: 6.9 g/dL (ref 6.5–8.1)

## 2019-08-12 MED ORDER — IOHEXOL 300 MG/ML  SOLN
75.0000 mL | Freq: Once | INTRAMUSCULAR | Status: AC | PRN
  Administered 2019-08-12: 13:00:00 75 mL via INTRAVENOUS

## 2019-08-12 MED ORDER — SODIUM CHLORIDE (PF) 0.9 % IJ SOLN
INTRAMUSCULAR | Status: AC
  Filled 2019-08-12: qty 50

## 2019-08-12 NOTE — Telephone Encounter (Signed)
Dr. Leonia Reeves wanted Dr. Julien Nordmann to be aware of results of pt's Chest CT scan today. Scan results are now available in [facility]

## 2019-08-13 ENCOUNTER — Inpatient Hospital Stay (HOSPITAL_BASED_OUTPATIENT_CLINIC_OR_DEPARTMENT_OTHER): Payer: Federal, State, Local not specified - PPO | Admitting: Internal Medicine

## 2019-08-13 ENCOUNTER — Encounter: Payer: Self-pay | Admitting: Internal Medicine

## 2019-08-13 ENCOUNTER — Other Ambulatory Visit: Payer: Self-pay

## 2019-08-13 VITALS — BP 109/73 | HR 84 | Temp 97.8°F | Resp 18 | Ht 76.0 in | Wt 182.4 lb

## 2019-08-13 DIAGNOSIS — Z5112 Encounter for antineoplastic immunotherapy: Secondary | ICD-10-CM

## 2019-08-13 DIAGNOSIS — I10 Essential (primary) hypertension: Secondary | ICD-10-CM | POA: Diagnosis not present

## 2019-08-13 DIAGNOSIS — Z7189 Other specified counseling: Secondary | ICD-10-CM | POA: Diagnosis not present

## 2019-08-13 DIAGNOSIS — C3491 Malignant neoplasm of unspecified part of right bronchus or lung: Secondary | ICD-10-CM

## 2019-08-13 DIAGNOSIS — C3431 Malignant neoplasm of lower lobe, right bronchus or lung: Secondary | ICD-10-CM | POA: Diagnosis not present

## 2019-08-13 DIAGNOSIS — Z01810 Encounter for preprocedural cardiovascular examination: Secondary | ICD-10-CM | POA: Insufficient documentation

## 2019-08-13 MED ORDER — HYDROCODONE-ACETAMINOPHEN 7.5-325 MG/15ML PO SOLN: 10.0000 mL | Freq: Three times a day (TID) | ORAL | 0 refills | Status: DC | PRN

## 2019-08-13 MED ORDER — PREDNISONE 20 MG PO TABS: ORAL_TABLET | ORAL | 0 refills | Status: DC

## 2019-08-13 NOTE — Progress Notes (Signed)
DISCONTINUE ON PATHWAY REGIMEN - Non-Small Cell Lung     Administer weekly:     Paclitaxel      Carboplatin   **Always confirm dose/schedule in your pharmacy ordering system**  REASON: Continuation Of Treatment PRIOR TREATMENT: [id number]: Carboplatin AUC=2 + Paclitaxel 45 mg/m2 Weekly During Radiation TREATMENT RESPONSE: Stable Disease (SD)  START ON PATHWAY REGIMEN - Non-Small Cell Lung     A cycle is every 14 days:     Durvalumab   **Always confirm dose/schedule in your pharmacy ordering system**  Patient Characteristics: Stage III - Unresectable, PS = 0, 1 AJCC T Category: T2b Current Disease Status: No Distant Mets or Local Recurrence AJCC N Category: N3 AJCC M Category: M0 AJCC 8 Stage Grouping: IIIB ECOG Performance Status: 1 Intent of Therapy: Curative Intent, Discussed with Patient

## 2019-08-13 NOTE — Progress Notes (Signed)
[facility] [phone]   [phone]) [phone]  OFFICE PROGRESS NOTE  [name], L.[name], [place] [address] [place] [address]  DIAGNOSIS: stage IIIb (T2b, N3, M0) non-small cell lung cancer, adenocarcinoma presented with right lower lobe lung nodule in addition to right hilar mass/node as well as ipsilateral and contralateral mediastinal lymphadenopathy diagnosed in October 2020.  Molecular studies by [facility] showed no actionable mutations.  PRIOR THERAPY: Concurrent chemoradiation with weekly carboplatin for AUC of 2 and paclitaxel 45 mg/M2.  Status post 7 cycles.  Last dose was given July 21, 2019  CURRENT THERAPY: Consolidation treatment with immunotherapy with Imfinzi 1500 mg  INTERVAL HISTORY: Alan [name] 63 y.o. male returns to the clinic today for follow-up visit.  His wife was available by face time during the visit.  The patient has been complaining of increasing fatigue and weakness as well as dysphagia and odynophagia over the last few weeks with no significant improvement after discontinuation of his treatment.  He also lost few pounds since his last visit.  The patient continues to have dry cough as well as shortness of breath with exertion but no significant chest pain or hemoptysis.  He denied having any current fever or chills.  He has no nausea, vomiting, diarrhea or constipation.  He denied having any headache or visual changes.  He had repeat CT scan of the chest performed recently and he is here today for evaluation and discussion of his scan results and treatment options.    MEDICAL HISTORY: Past Medical History:  Diagnosis Date  . CAD S/P percutaneous coronary angioplasty 2004   which showed essentially normal LV size and function, moderately dilated left atrium, moderate mitral prolapse with mild to moderate regurgitation.  . Dyslipidemia, goal LDL below 70   . Essential hypertension   . GERD  (gastroesophageal reflux disease)   . History of Mitral valve prolapse    Moderate - with moderate MR, noted February t 2013  . History of Severe mitral regurgitation by prior echocardiogram 02/06/2014   TEE: Severe mitral regurgitation with a flail P2 segment and ruptured; Normal LV size & function, dilated LA.  Marland Kitchen History of stress test 12/2009   he walked 9 mins reaching 10 METS. There was an attenuation artifact in the inferior region but no ischemia or infaract, low risk.  . Incidental lung nodule, greater than or equal to 62mm 03/16/2014   Ground glass opacity RML noted on CT scan  . lung ca dx'd 03/2019  . PONV (postoperative nausea and vomiting)   . 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 ST elevation myocardial infarction (STEMI) of anterior wall, subsequent episode of care [facility]) 2004   he had a proximal LAD occlusion treated with 2 overlapping 3.5 x 1.8 mm Cypher DES stents.    ALLERGIES:  is allergic to a-cillin [ampicillin]; amoxicillin; other; and penicillins.  MEDICATIONS:  Current Outpatient Medications  Medication Sig Dispense Refill  . acetaminophen (TYLENOL) 160 MG/5ML liquid Take by mouth every 4 (four) hours as needed for fever.    Marland Kitchen ibuprofen (ADVIL) 100 MG/5ML suspension Take 200 mg by mouth every 4 (four) hours as needed. Alternate with tylenol    . Ascorbic Acid (VITAMIN C)  1000 MG tablet Take 2,000 mg by mouth daily.     . Coenzyme Q10 (CO Q 10) 100 MG CAPS Take 100 mg by mouth daily.     . metoprolol succinate (TOPROL-XL) 50 MG 24 hr tablet Take 1 tablet (50 mg total) by mouth daily. 30 tablet 0  . Probiotic Product (PROBIOTIC DAILY PO) Take 1 capsule by mouth daily.     . prochlorperazine  (COMPAZINE) 10 MG tablet Take 1 tablet (10 mg total) by mouth every 6 (six) hours as needed for nausea or vomiting. 30 tablet 0  . Specialty Vitamins Products (PROSTATE PO) Take 1 capsule by mouth daily. PROSTATE FORMULA    . sucralfate (CARAFATE) 1 g tablet Take 1 tablet (1 g total) by mouth 4 (four) times daily -  with meals and at bedtime. crush and dissolve in 10 cc of water prior to consuming 120 tablet 1  . Vitamin D-Vitamin K (D3 + K2 DOTS PO) Take 1 tablet by mouth daily.    Marland Kitchen zolpidem (AMBIEN) 5 MG tablet TAKE 1 TABLET BY MOUTH EVERY NIGHT AT BEDTIME AS NEEDED FOR SLEEP (Patient taking differently: Take 5 mg by mouth at bedtime as needed for sleep. ) 90 tablet 2   No current facility-administered medications for this visit.    SURGICAL HISTORY:  Past Surgical History:  Procedure Laterality Date  . ANTERIOR CRUCIATE LIGAMENT REPAIR Left 1993  . CARDIAC CATHETERIZATION  2004   he had a proximal LAD occlusion treated with 2 overlapping 3.5 x 1.8 mm Cypher DES stents.  Marland Kitchen CARDIAC CATHETERIZATION  2007; 03/2014   Widely patent LAD stents, 40% distal LAD, 30% Cx, ~50% PL-PDA bifurcation lesion   . COLONOSCOPY    . INTRAOPERATIVE TRANSESOPHAGEAL ECHOCARDIOGRAM N/A 04/08/2014   Procedure: INTRAOPERATIVE TRANSESOPHAGEAL ECHOCARDIOGRAM;  Surgeon: Rexene Alberts, MD;  Location: [facility];  Service: Open Heart Surgery;  Laterality: N/A;  . LEFT AND RIGHT HEART CATHETERIZATION WITH CORONARY ANGIOGRAM N/A 03/05/2014   Procedure: LEFT AND RIGHT HEART CATHETERIZATION WITH CORONARY ANGIOGRAM;  Surgeon: Leonie Man, MD;  Location: [facility] CATH LAB;  Service: Cardiovascular;  Laterality: N/A;  . LEFT HEART CATHETERIZATION WITH CORONARY ANGIOGRAM N/A 09/15/2011   Procedure: LEFT HEART CATHETERIZATION WITH CORONARY ANGIOGRAM;  Surgeon: Lorretta Harp, MD;  Location[facility] C[facility] LAB;  Service: Cardiovascular;  Laterality: N/A;  . MITRAL VALVE REPAIR Right 04/08/2014   Procedure: MINIMALLY INVASIVE MITRAL VALVE REPAIR  (MVR);  Surgeon: Rexene Alberts, MD;  Location: [facility];  Service: Open Heart Surgery;  Laterality: Right;  . NM MYOVIEW LTD  May 2011   Walk 9 minutes, and 10 METs, diaphragmatic attenuation but no ischemia or infarction.  . TEE WITHOUT CARDIOVERSION N/A 02/06/2014   Procedure: TRANSESOPHAGEAL ECHOCARDIOGRAM (TEE);  Surgeon: Pixie Casino, MD;  Normal LV Size U& function - EF 55-60%, no regional WMA.  MV P2 Leaflet is flail with ruptured chord with severe prolapse, anterior leaflet intact.  Severe, eccentric anterior directed MR with dilated LA.  Marland Kitchen TRANSTHORACIC ECHOCARDIOGRAM  07/18/2018   Normal LV size and function.  EF 50-60 %.  Unable to assess diastolic function.  Mitral valve sewing ring in place.  Mild stenosis with a gradient of 6 mmHg noted. -  Domingo Dimes ECHOCARDIOGRAM  June 30 2015   Low normal LV function with EF 50-55%. GR 1 DD. No MR. Normal gradient of 4 mmHg the mitral valve. No prolapse.  Marland Kitchen VIDEO BRONCHOSCOPY WITH ENDOBRONCHIAL ULTRASOUND N/A 05/16/2019   Procedure: VIDEO  BRONCHOSCOPY WITH ENDOBRONCHIAL ULTRASOUND;  Surgeon: Marshell Garfinkel, MD;  Location: [facility];  Service: Pulmonary;  Laterality: N/A;    REVIEW OF SYSTEMS:  Constitutional: positive for fatigue and weight loss Eyes: negative Ears, nose, mouth, throat, and face: negative Respiratory: positive for cough and dyspnea on exertion Cardiovascular: negative Gastrointestinal: positive for dyspepsia and odynophagia Genitourinary:negative Integument/breast: negative Hematologic/lymphatic: negative Musculoskeletal:negative Neurological: negative Behavioral/Psych: negative Endocrine: negative Allergic/Immunologic: negative   PHYSICAL EXAMINATION: General appearance: alert, cooperative, fatigued and no distress Head: Normocephalic, without obvious abnormality, atraumatic Neck: no adenopathy, no JVD, supple, symmetrical, trachea midline and thyroid not enlarged, symmetric, no tenderness/mass/nodules Lymph  nodes: Cervical, supraclavicular, and axillary nodes normal. Resp: rales RUL Back: symmetric, no curvature. ROM normal. No CVA tenderness. Cardio: regular rate and rhythm, S1, S2 normal, no murmur, click, rub or gallop GI: soft, non-tender; bowel sounds normal; no masses,  no organomegaly Extremities: extremities normal, atraumatic, no cyanosis or edema Neurologic: Alert and oriented X 3, normal strength and tone. Normal symmetric reflexes. Normal coordination and gait  ECOG PERFORMANCE STATUS: 1 - Symptomatic but completely ambulatory  Blood pressure 109/73, pulse 84, temperature 97.8 F (36.6 C), temperature source Temporal, resp. rate 18, height 6\' 4"  (1.93 m), weight 182 lb 6.4 oz (82.7 kg), SpO2 100 %.  LABORATORY DATA: Lab Results  Component Value Date   WBC 4.2 08/12/2019   HGB 10.5 (L) 08/12/2019   HCT 32.3 (L) 08/12/2019   MCV 97.6 08/12/2019   PLT 213 08/12/2019      Chemistry      Component Value Date/Time   NA 140 08/12/2019 1201   K 3.9 08/12/2019 1201   CL 104 08/12/2019 1201   CO2 25 08/12/2019 1201   BUN 16 08/12/2019 1201   CREATININE 0.72 08/12/2019 1201   CREATININE 0.80 02/27/2014 1152      Component Value Date/Time   CALCIUM 9.1 08/12/2019 1201   ALKPHOS 71 08/12/2019 1201   AST 18 08/12/2019 1201   ALT 14 08/12/2019 1201   BILITOT 0.6 08/12/2019 1201       RADIOGRAPHIC STUDIES: CT Chest W Contrast  Result Date: 08/12/2019 CLINICAL DATA:  Lung ca; chemo and xrt comp x 20 days; wt loss; SOB on exertion; throat pain since XRT comp^65mL OMNIPAQUE IOHEXOL 300 MG/ML SOLNNon-small cell lung cancer, staging EXAM: CT CHEST WITH CONTRAST TECHNIQUE: Multidetector CT imaging of the chest was performed during intravenous contrast administration. CONTRAST:  9mL OMNIPAQUE IOHEXOL 300 MG/ML  SOLN COMPARISON:  CT 07/01/2019, PET-CT 05/15/2019 FINDINGS: Cardiovascular: Coronary artery calcification and aortic atherosclerotic calcification. Mediastinum/Nodes:  Prevascular lymph node anterior to the RIGHT brachiocephalic vein measures 12 mm compared to 12 mm. RIGHT lower paratracheal lymph node measures 12 mm compared to 13 mm. No new adenopathy. Lungs/Pleura: RIGHT perihilar thickening consolidation is similar prior. Postobstructive pneumonitis in the RIGHT middle lobe is slightly increased. New linear nodularity in the RIGHT middle lobe measuring 24 x 11 mm (image 90/7). New patchy ground-glass densities in the RIGHT upper lobe and RIGHT lower lobe. LEFT lung is clear. Upper Abdomen: Limited view of the liver, kidneys, pancreas are unremarkable. Normal adrenal glands. Musculoskeletal: No aggressive osseous lesion. IMPRESSION: 1. Increase in RIGHT lung postobstructive pneumonitis, perihilar nodularity and ground-glass densities in the RIGHT upper lobe and RIGHT lower lobe. Differential would include progressive radiation change, radiation change with superimposed pulmonary infection, or tumor recurrence. Favor radiation change with superimposed infection. Unilateral drug reaction action is unlikely. 2. Stable mediastinal lymphadenopathy. These results will be called to the  ordering clinician or representative by the Radiologist Assistant, and communication documented in the PACS or zVision Dashboard. Electronically Signed   By: Suzy Bouchard M.D.   On: 08/12/2019 15:25    ASSESSMENT AND PLAN: This is a very pleasant 63 years old white male recently diagnosed with a stage IIIb non-small cell lung cancer, adenocarcinoma with no actionable mutation.  He completed a course of concurrent chemoradiation with weekly carboplatin and paclitaxel status post 7 cycles.   His treatment was complicated with dysphagia, odynophagia secondary to radiation induced esophagitis.  He also has persistent cough and shortness of breath secondary to radiation-induced pneumonitis. The patient had repeat CT scan of the chest performed recently.  I personally and independently reviewed the  scans and discussed the results with the patient and his wife and showed him the images. His scan showed no concerning findings for disease progression but there was significant radiation-induced pneumonitis. I discussed with the patient his treatment options including observation versus treatment with consolidation immunotherapy with Imfinzi 1500 mg IV every 3 weeks.  The patient is still recovering from his previous course of concurrent chemoradiation.  He is interested in the treatment with the immunotherapy but we will delay the start of the first cycle of this treatment by at least 3 weeks until he feels better regarding the radiation-induced pneumonitis as well as esophagitis. For the radiation-induced pneumonitis I will start the patient on a tapered dose of prednisone over the next 3 weeks. For the radiation-induced esophagitis, he will continue his current treatment with Carafate and I will added hydrocodone syrup for management of the odynophagia. The patient will come back for follow-up visit in 3 weeks for evaluation before starting the first dose of his treatment.  He was advised to call immediately if he has any concerning symptoms in the interval. The patient voices understanding of current disease status and treatment options and is in agreement with the current care plan. All questions were answered. The patient knows to call the clinic with any problems, questions or concerns. We can certainly see the patient much sooner if necessary.  I spent 30 minutes counseling the patient face to face. The total time spent in the appointment was 40 minutes.  Disclaimer: This note was dictated with voice recognition software. Similar sounding words can inadvertently be transcribed and may not be corrected upon review.

## 2019-08-14 ENCOUNTER — Telehealth: Payer: Self-pay | Admitting: Internal Medicine

## 2019-08-14 NOTE — Telephone Encounter (Signed)
Scheduled per los. Called and left msg. mailed printout  

## 2019-08-21 ENCOUNTER — Other Ambulatory Visit: Payer: Self-pay | Admitting: Cardiology

## 2019-08-21 NOTE — Telephone Encounter (Signed)
Pt calling requesting a refill on zolpidem. Pt stating that he has had to take more than usual and has run out of the medication and the pharmacy will not refill it because it is to soon. Pt would like a call back concerning this matter. Please address

## 2019-08-25 ENCOUNTER — Other Ambulatory Visit: Payer: Self-pay

## 2019-08-25 ENCOUNTER — Telehealth: Payer: Self-pay | Admitting: Cardiology

## 2019-08-25 ENCOUNTER — Encounter: Payer: Self-pay | Admitting: Radiation Oncology

## 2019-08-25 ENCOUNTER — Ambulatory Visit
Admission: RE | Admit: 2019-08-25 | Discharge: 2019-08-25 | Disposition: A | Discharge status: Home / Self Care | Payer: Federal, State, Local not specified - PPO | Source: Ambulatory Visit | Attending: Radiation Oncology | Admitting: Radiation Oncology

## 2019-08-25 VITALS — BP 105/78 | HR 72 | Temp 98.2°F | Resp 18 | Ht 76.0 in | Wt 176.4 lb

## 2019-08-25 DIAGNOSIS — J189 Pneumonia, unspecified organism: Secondary | ICD-10-CM | POA: Diagnosis not present

## 2019-08-25 DIAGNOSIS — R590 Localized enlarged lymph nodes: Secondary | ICD-10-CM | POA: Insufficient documentation

## 2019-08-25 DIAGNOSIS — Z79899 Other long term (current) drug therapy: Secondary | ICD-10-CM | POA: Diagnosis not present

## 2019-08-25 DIAGNOSIS — C3491 Malignant neoplasm of unspecified part of right bronchus or lung: Secondary | ICD-10-CM

## 2019-08-25 DIAGNOSIS — Z923 Personal history of irradiation: Secondary | ICD-10-CM | POA: Diagnosis not present

## 2019-08-25 DIAGNOSIS — C3431 Malignant neoplasm of lower lobe, right bronchus or lung: Secondary | ICD-10-CM | POA: Insufficient documentation

## 2019-08-25 NOTE — Patient Instructions (Signed)
Coronavirus (COVID-19) Are you at risk?  Are you at risk for the Coronavirus (COVID-19)?  To be considered HIGH RISK for Coronavirus (COVID-19), you have to meet the following criteria:  . Traveled to [place], [place], [place], [place] or [place]; or in the [place] to [place], [place], [place], or [place]; and have fever, cough, and shortness of breath within the last 2 weeks of travel OR . Been in close contact with a person diagnosed with COVID-19 within the last 2 weeks and have fever, cough, and shortness of breath . IF YOU DO NOT MEET THESE CRITERIA, YOU ARE CONSIDERED LOW RISK FOR COVID-19.  What to do if you are HIGH RISK for COVID-19?  . If you are having a medical emergency, call 911. . Seek medical care right away. Before you go to a doctor's office, urgent care or emergency department, call ahead and tell them about your recent travel, contact with someone diagnosed with COVID-19, and your symptoms. You should receive instructions from your physician's office regarding next steps of care.  . When you arrive at healthcare provider, tell the healthcare staff immediately you have returned from visiting [place], [place], [place], [place] or [place]; or traveled in the [place] to [place], [place], [place], or [place]; in the last two weeks or you have been in close contact with a person diagnosed with COVID-19 in the last 2 weeks.   . Tell the health care staff about your symptoms: fever, cough and shortness of breath. . After you have been seen by a medical provider, you will be either: o Tested for (COVID-19) and discharged home on quarantine except to seek medical care if symptoms worsen, and asked to  - Stay home and avoid contact with others until you get your results (4-5 days)  - Avoid travel on public transportation if possible (such as bus, train, or airplane) or o Sent to the Emergency Department by EMS for evaluation, COVID-19 testing, and possible  admission depending on your condition and test results.  What to do if you are LOW RISK for COVID-19?  Reduce your risk of any infection by using the same precautions used for avoiding the common cold or flu:  . Wash your hands often with soap and warm water for at least 20 seconds.  If soap and water are not readily available, use an alcohol-based hand sanitizer with at least 60% alcohol.  . If coughing or sneezing, cover your mouth and nose by coughing or sneezing into the elbow areas of your shirt or coat, into a tissue or into your sleeve (not your hands). . Avoid shaking hands with others and consider head nods or verbal greetings only. . Avoid touching your eyes, nose, or mouth with unwashed hands.  . Avoid close contact with people who are sick. . Avoid places or events with large numbers of people in one location, like concerts or sporting events. . Carefully consider travel plans you have or are making. . If you are planning any travel outside or inside the [place], visit the CDC's Travelers' Health webpage for the latest health notices. . If you have some symptoms but not all symptoms, continue to monitor at home and seek medical attention if your symptoms worsen. . If you are having a medical emergency, call 911.   ADDITIONAL HEALTHCARE OPTIONS FOR PATIENTS  [facility] / e-Visit: [email or web address]         [facility]: [phone]  [facility]: [phone]                   [facility]: [phone]   

## 2019-08-25 NOTE — Progress Notes (Signed)
Radiation Oncology         [phone] ________________________________  Name: Alan [name] MRN: [id number]  Date: 08/25/2019  DOB: [birth year]  Follow-Up Visit Note  CC: [name], L.[name], MD  [name], L.[name], MD    ICD-10-CM   1. Adenocarcinoma of right lung, stage 3 (HCC)  C34.91     Diagnosis: Stage IIIb (T2b, N3, M0) non-small cell lung cancer, adenocarcinoma presented with right lower lobe lung nodule in addition to right hilar mass/node as well as ipsilateral and contralateral mediastinal lymphadenopathy diagnosed in October 2020.  Interval Since Last Radiation: One month and three days.   Radiation Treatment Dates: 06/09/2019 through 07/22/2019 Site Technique Total Dose (Gy) Dose per Fx (Gy) Completed Fx Beam Energies  Lung, Right: Lung_Rt 3D 60/60 2 30/30 6X, 15X    Narrative:  The patient returns today for routine follow-up.   Since end of treatment, patient had a CT scan of chest done on 08/12/2019 which showed an increase in right lung postobstructive pneumonitis, perihilar nodularity, and ground-glass densities in the right upper and right lower lobes. Differentials included progressive radiation changes vs radiation change with superimposed pulmonary infection vs tumor recurrence. Stable mediastinal lymphadenopathy.  Patient was seen by Dr. Julien Nordmann on 08/13/2019. 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 Patient anticipated to begin consolidation treatment with immunotherapy with Imfinzi 1500 mg this month.  On review of systems, patient reports he is feeling much better after being placed on steroids.  He has no esophageal symptoms at this time and can pretty much eat any type of food.  He denies any breathing problems.. Patient denies cough at this time.                         ALLERGIES:  is  allergic to a-cillin [ampicillin]; amoxicillin; other; and penicillins.  Meds: Current Outpatient Medications  Medication Sig Dispense Refill  . acetaminophen (TYLENOL) 160 MG/5ML liquid Take by mouth every 4 (four) hours as needed for fever.    . Ascorbic Acid (VITAMIN C) 1000 MG tablet Take 2,000 mg by mouth daily.     . Coenzyme Q10 (CO Q 10) 100 MG CAPS Take 100 mg by mouth daily.     Marland Kitchen ibuprofen (ADVIL) 100 MG/5ML suspension Take 200 mg by mouth every 4 (four) hours as needed. Alternate with tylenol    . metoprolol succinate (TOPROL-XL) 50 MG 24 hr tablet Take 1 tablet (50 mg total) by mouth daily. 30 tablet 0  . predniSONE (DELTASONE) 20 MG tablet 4 tablet p.o. daily for 4 days, then 3 tablet p.o. daily for 4 days then 2 tablets p.o. daily for 4 days then 1 tablet p.o. daily for 4 days then half a tablet p.o. q. days for 7 days. 44 tablet 0  . Probiotic Product (PROBIOTIC DAILY PO) Take 1 capsule by mouth daily.     . prochlorperazine (COMPAZINE) 10 MG tablet Take 1 tablet (10 mg total) by mouth every 6 (six) hours as needed for nausea or vomiting. 30 tablet 0  . Specialty Vitamins Products (PROSTATE PO) Take 1 capsule by mouth daily. PROSTATE FORMULA    . sucralfate (CARAFATE) 1 g tablet Take 1 tablet (1 g total) by mouth 4 (four) times daily -  with meals and at bedtime. crush and dissolve in 10 cc of water prior to consuming 120 tablet  1  . Vitamin D-Vitamin K (D3 + K2 DOTS PO) Take 1 tablet by mouth daily.    Marland Kitchen zolpidem (AMBIEN) 5 MG tablet TAKE 1 TABLET BY MOUTH EVERY NIGHT AT BEDTIME AS NEEDED FOR SLEEP (Patient taking differently: Take 5 mg by mouth at bedtime as needed for sleep. ) 90 tablet 2  . HYDROcodone-acetaminophen (HYCET) 7.5-325 mg/15 ml solution Take 10 mLs by mouth 3 (three) times daily as needed for moderate pain. (Patient not taking: Reported on 08/25/2019) 120 mL 0   No current facility-administered medications for this encounter.    Physical Findings: The patient is  in no acute distress. Patient is alert and oriented.  height is 6\' 4"  (1.93 m) and weight is 176 lb 6 oz (80 kg). His temporal temperature is 98.2 F (36.8 C). His blood pressure is 105/78 and his pulse is 72. His respiration is 18 and oxygen saturation is 100%. .  Oral cavity free of any secondary infection. Lungs are clear to auscultation bilaterally. Heart has regular rate and rhythm. No palpable cervical, supraclavicular, or axillary adenopathy. Abdomen soft, non-tender, normal bowel sounds.  Minimal skin changes noted in the upper chest and upper back region  Lab Findings: Lab Results  Component Value Date   WBC 4.2 08/12/2019   HGB 10.5 (L) 08/12/2019   HCT 32.3 (L) 08/12/2019   MCV 97.6 08/12/2019   PLT 213 08/12/2019    Radiographic Findings: CT Chest W Contrast  Result Date: 08/12/2019 CLINICAL DATA:  Lung ca; chemo and xrt comp x 20 days; wt loss; SOB on exertion; throat pain since XRT comp^72mL OMNIPAQUE IOHEXOL 300 MG/ML SOLNNon-small cell lung cancer, staging EXAM: CT CHEST WITH CONTRAST TECHNIQUE: Multidetector CT imaging of the chest was performed during intravenous contrast administration. CONTRAST:  40mL OMNIPAQUE IOHEXOL 300 MG/ML  SOLN COMPARISON:  CT 07/01/2019, PET-CT 05/15/2019 FINDINGS: Cardiovascular: Coronary artery calcification and aortic atherosclerotic calcification. Mediastinum/Nodes: Prevascular lymph node anterior to the RIGHT brachiocephalic vein measures 12 mm compared to 12 mm. RIGHT lower paratracheal lymph node measures 12 mm compared to 13 mm. No new adenopathy. Lungs/Pleura: RIGHT perihilar thickening consolidation is similar prior. Postobstructive pneumonitis in the RIGHT middle lobe is slightly increased. New linear nodularity in the RIGHT middle lobe measuring 24 x 11 mm (image 90/7). New patchy ground-glass densities in the RIGHT upper lobe and RIGHT lower lobe. LEFT lung is clear. Upper Abdomen: Limited view of the liver, kidneys, pancreas are  unremarkable. Normal adrenal glands. Musculoskeletal: No aggressive osseous lesion. IMPRESSION: 1. Increase in RIGHT lung postobstructive pneumonitis, perihilar nodularity and ground-glass densities in the RIGHT upper lobe and RIGHT lower lobe. Differential would include progressive radiation change, radiation change with superimposed pulmonary infection, or tumor recurrence. Favor radiation change with superimposed infection. Unilateral drug reaction action is unlikely. 2. Stable mediastinal lymphadenopathy. These results will be called to the ordering clinician or representative by the Radiologist Assistant, and communication documented in the PACS or zVision Dashboard. Electronically Signed   By: Suzy Bouchard M.D.   On: 08/12/2019 15:25    Impression:  The patient is recovering from the effects of radiation.  Patient's clinical situation is much improved after a tapering dose of steroids.  Plan: Patient will follow-up with Dr. Julien Nordmann on 09/04/2019. Patient will follow-up with radiation oncology in three months.  Patient will proceed with immunotherapy in the near future.  ____________________________________   Blair Promise, PhD, MD  This document serves as a record of services personally performed by Gery Pray, MD.  It was created on his behalf by Clerance Lav, a trained medical scribe. The creation of this record is based on the scribe's personal observations and the provider's statements to them. This document has been checked and approved by the attending provider.

## 2019-08-25 NOTE — Telephone Encounter (Signed)
New Message    Pt c/o medication issue:  1. Name of Medication: zolpidem (AMBIEN) 5 MG tablet  2. How are you currently taking this medication (dosage and times per day)? 1 tablet as needed at bedtime   3. Are you having a reaction (difficulty breathing--STAT)? No   4. What is your medication issue? Pt says the last refill on this medication was for a quantity for 60 and the insurance will not cover the medication again until September. He is wondering if it can be written for a different quantity so the insurance will cover the medication    Please advise

## 2019-08-25 NOTE — Progress Notes (Signed)
Pt presents today for one month f/u with Dr. Sondra Come. Pt reports that fatigue is slowing resolving and that he feels about "75-80%". Pt denies c/o pain. Pt reports swallowing is improving. Pt reports he is finishing Metoprolol and will not be resuming. Strongly encouraged pt to discuss this with cardiologist prior to making any changes to heart medication without a physician. Pt denies cough, denies hemoptysis. Pt reports slight SOB with exertion.  BP 105/78 (BP Location: Left Arm, Patient Position: Sitting)   Pulse 72   Temp 98.2 F (36.8 C) (Temporal)   Resp 18   Ht 6\' 4"  (1.93 m)   Wt 176 lb 6 oz (80 kg)   SpO2 100%   BMI 21.47 kg/m   Wt Readings from Last 3 Encounters:  08/25/19 176 lb 6 oz (80 kg)  08/13/19 182 lb 6.4 oz (82.7 kg)  07/15/19 185 lb (83.9 kg)   Loma Sousa, RN BSN

## 2019-08-28 NOTE — Telephone Encounter (Signed)
Pt had 5 mg Ambien PRN Rx sent in September for 90 tabs/2refills.  He states insurance will not cover another refill at this time.   The patient states he has been taking them more frequently in the past few months due to being diagnosed with lung cancer in September. There were several weeks where he took them each night in order to sleep.He states he is doing better now and does not need them as often but patient is now completely out of medication. Pt would like a new Rx perhaps more frequently than PRN. I advised Mr. Slimp we would ask Dr. Ellyn Hack for that new Rx. He verbalized understanding and thanked me for the call.

## 2019-08-28 NOTE — Telephone Encounter (Signed)
Follow Up:     Pt is calling back to find out about his Ambien.

## 2019-08-31 MED ORDER — ZOLPIDEM TARTRATE 5 MG PO TABS: ORAL_TABLET | ORAL | 2 refills | Status: DC

## 2019-08-31 NOTE — Telephone Encounter (Signed)
Ok to send Rx for Ambien 5 mg tab; Sig take 1-2 tab PO QHS PRN for sleep; Disp #120 tab with 2 refill. Glenetta Hew, MD c

## 2019-09-02 ENCOUNTER — Ambulatory Visit (HOSPITAL_COMMUNITY): Payer: Federal, State, Local not specified - PPO | Attending: Cardiovascular Disease

## 2019-09-02 ENCOUNTER — Other Ambulatory Visit: Payer: Self-pay

## 2019-09-02 DIAGNOSIS — I340 Nonrheumatic mitral (valve) insufficiency: Secondary | ICD-10-CM | POA: Insufficient documentation

## 2019-09-02 DIAGNOSIS — Z9861 Coronary angioplasty status: Secondary | ICD-10-CM | POA: Insufficient documentation

## 2019-09-02 DIAGNOSIS — I341 Nonrheumatic mitral (valve) prolapse: Secondary | ICD-10-CM | POA: Diagnosis present

## 2019-09-02 DIAGNOSIS — I10 Essential (primary) hypertension: Secondary | ICD-10-CM | POA: Diagnosis present

## 2019-09-02 DIAGNOSIS — I471 Supraventricular tachycardia: Secondary | ICD-10-CM | POA: Diagnosis present

## 2019-09-02 DIAGNOSIS — I2510 Atherosclerotic heart disease of native coronary artery without angina pectoris: Secondary | ICD-10-CM | POA: Diagnosis present

## 2019-09-03 ENCOUNTER — Telehealth: Payer: Self-pay | Admitting: Cardiology

## 2019-09-03 ENCOUNTER — Other Ambulatory Visit: Payer: Self-pay | Admitting: Medical Oncology

## 2019-09-03 DIAGNOSIS — C3491 Malignant neoplasm of unspecified part of right bronchus or lung: Secondary | ICD-10-CM

## 2019-09-03 NOTE — Progress Notes (Signed)
[place] OFFICE PROGRESS NOTE  [name], L.[name], [place] [address] [place] [address]  DIAGNOSIS: Stage IIIb (T2b, N3, M0) non-small cell lung cancer, adenocarcinoma presented with right lower lobe lung nodule in addition to right hilar mass/node as well as ipsilateral and contralateral mediastinal lymphadenopathy diagnosed in October 2020.  Molecular studies by[employer]ant 360showed no actionable mutations.  PRIOR THERAPY: Concurrent chemoradiation with weekly carboplatin for AUC of 2 and paclitaxel 45 mg/M2. Status post 7 cycles. Last dose on 07/21/2019.  CURRENT THERAPY: Immunotherapy with Imfinzi 1500 mg IV every 4 weeks. First dose expected on 09/04/2019.   INTERVAL HISTORY: Alan [name] 64 y.o. male returns to the clinic for a follow up visit. The patient completed 7 weekly doses of concurrent chemoradiation. He had developed odynophagia and dysphagia secondary to radiation for which he used carafate and hydrocodone syrup. His swallowing is improved at this time. Per chart review, he has lost 5 lbs since his last appointment; although he had been losing weight through his last course of treatment.   His last CT scan showed radiation induced pneumonitis. He was placed on a prednisone taper. He is currently taking 10 mg of prednisone at this time and has 1-2 doses left. He is scheduled to begin his first cycle of immunotherapy with Imfinzi today.  Denies any fever, chills, or night sweats. Denies any chest pain or hemoptysis. He denies shortness of breath. In fact, he states he walked 1.5 miles the other day without any difficulty. He still has a cough but he states it is improved since taking the steroids. Denies any nausea, vomiting, diarrhea, or constipation. Denies any headache or visual changes. He did describe an episode last week where he was laying on his right arm in an unusual position and experienced numbness/tingling and "lost control of his hand"  for approximately 25-30 seconds. He denies seizure history.  Denies any rashes or skin changes. The patient is here today for evaluation prior to starting cycle # 1  MEDICAL HISTORY: Past Medical History:  Diagnosis Date  . CAD S/P percutaneous coronary angioplasty 2004   which showed essentially normal LV size and function, moderately dilated left atrium, moderate mitral prolapse with mild to moderate regurgitation.  . Dyslipidemia, goal LDL below 70   . Essential hypertension   . GERD (gastroesophageal reflux disease)   . History of Mitral valve prolapse    Moderate - with moderate MR, noted February t 2013  . History of Severe mitral regurgitation by prior echocardiogram 02/06/2014   TEE: Severe mitral regurgitation with a flail P2 segment and ruptured; Normal LV size & function, dilated LA.  Marland Kitchen History of stress test 12/2009   he walked 9 mins reaching 10 METS. There was an attenuation artifact in the inferior region but no ischemia or infaract, low risk.  . Incidental lung nodule, greater than or equal to 39mm 03/16/2014   Ground glass opacity RML noted on CT scan  . lung ca dx'd 03/2019  . PONV (postoperative nausea and vomiting)   . 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 ST elevation myocardial infarction (STEMI) of anterior wall, subsequent episode of care [facility]) 2004   he had a proximal LAD occlusion treated with 2 overlapping 3.5 x  1.8 mm Cypher DES stents.    ALLERGIES:  is allergic to a-cillin [ampicillin]; amoxicillin; other; and penicillins.  MEDICATIONS:  Current Outpatient Medications  Medication Sig Dispense Refill  . Ascorbic Acid (VITAMIN C) 1000 MG tablet Take 2,000 mg by mouth daily.     .  Coenzyme Q10 (CO Q 10) 100 MG CAPS Take 100 mg by mouth daily.     . metoprolol succinate (TOPROL-XL) 50 MG 24 hr tablet Take 1 tablet (50 mg total) by mouth daily. (Patient taking differently: Take 25 mg by mouth daily. ) 30 tablet 0  . predniSONE (DELTASONE) 20 MG tablet 4 tablet p.o. daily for 4 days, then 3 tablet p.o. daily for 4 days then 2 tablets p.o. daily for 4 days then 1 tablet p.o. daily for 4 days then half a tablet p.o. q. days for 7 days. 44 tablet 0  . Probiotic Product (PROBIOTIC DAILY PO) Take 1 capsule by mouth daily.     Marland Kitchen Specialty Vitamins Products (PROSTATE PO) Take 1 capsule by mouth daily. PROSTATE FORMULA    . sucralfate (CARAFATE) 1 g tablet Take 1 tablet (1 g total) by mouth 4 (four) times daily -  with meals and at bedtime. crush and dissolve in 10 cc of water prior to consuming 120 tablet 1  . Vitamin D-Vitamin K (D3 + K2 DOTS PO) Take 1 tablet by mouth daily.    Marland Kitchen zolpidem (AMBIEN) 5 MG tablet TAKE 1-2 TABLET BY MOUTH EVERY NIGHT AT BEDTIME AS NEEDED FOR SLEEP 120 tablet 2  . prochlorperazine (COMPAZINE) 10 MG tablet Take 1 tablet (10 mg total) by mouth every 6 (six) hours as needed for nausea or vomiting. (Patient not taking: Reported on 09/04/2019) 30 tablet 0   No current facility-administered medications for this visit.   Facility-Administered Medications Ordered in Other Visits  Medication Dose Route Frequency Provider Last Rate Last Admin  . durvalumab (IMFINZI) 1,500 mg in sodium chloride 0.9 % 100 mL chemo infusion  1,500 mg Intravenous Once Curt Bears, MD        SURGICAL HISTORY:  Past Surgical History:  Procedure Laterality Date  . ANTERIOR CRUCIATE LIGAMENT REPAIR Left 1993  . CARDIAC CATHETERIZATION  2004   he had a proximal LAD occlusion treated with 2 overlapping 3.5 x 1.8 mm Cypher DES stents.  Marland Kitchen CARDIAC CATHETERIZATION  2007; 03/2014   Widely patent LAD stents, 40% distal LAD, 30% Cx, ~50% PL-PDA bifurcation lesion   . COLONOSCOPY    .  INTRAOPERATIVE TRANSESOPHAGEAL ECHOCARDIOGRAM N/A 04/08/2014   Procedure: INTRAOPERATIVE TRANSESOPHAGEAL ECHOCARDIOGRAM;  Surgeon: Rexene Alberts, MD;  Location: [place];  Service: Open Heart Surgery;  Laterality: N/A;  . LEFT AND RIGHT HEART CATHETERIZATION WITH CORONARY ANGIOGRAM N/A 03/05/2014   Procedure: LEFT AND RIGHT HEART CATHETERIZATION WITH CORONARY ANGIOGRAM;  Surgeon: Leonie Man, MD;  Location: [facility] CATH LAB;  Service: Cardiovascular;  Laterality: N/A;  . LEFT HEART CATHETERIZATION WITH CORONARY ANGIOGRAM N/A 09/15/2011   Procedure: LEFT HEART CATHETERIZATION WITH CORONARY ANGIOGRAM;  Surgeon: Lorretta Harp, MD;  Location: [facility] C[facility] LAB;  Service: Cardiovascular;  Laterality: N/A;  . MITRAL VALVE REPAIR Right 04/08/2014   Procedure: MINIMALLY INVASIVE MITRAL VALVE REPAIR (MVR);  Surgeon: Rexene Alberts, MD;  Location: [facility];  Service: Open Heart Surgery;  Laterality: Right;  . NM MYOVIEW LTD  May 2011   Walk 9 minutes, and 10 METs, diaphragmatic attenuation but no ischemia or infarction.  . TEE WITHOUT CARDIOVERSION N/A 02/06/2014  Procedure: TRANSESOPHAGEAL ECHOCARDIOGRAM (TEE);  Surgeon: Pixie Casino, MD;  Normal LV Size U& function - EF 55-60%, no regional WMA.  MV P2 Leaflet is flail with ruptured chord with severe prolapse, anterior leaflet intact.  Severe, eccentric anterior directed MR with dilated LA.  Marland Kitchen TRANSTHORACIC ECHOCARDIOGRAM  07/18/2018   Normal LV size and function.  EF 50-60 %.  Unable to assess diastolic function.  Mitral valve sewing ring in place.  Mild stenosis with a gradient of 6 mmHg noted. -  Domingo Dimes ECHOCARDIOGRAM  June 30 2015   Low normal LV function with EF 50-55%. GR 1 DD. No MR. Normal gradient of 4 mmHg the mitral valve. No prolapse.  Marland Kitchen VIDEO BRONCHOSCOPY WITH ENDOBRONCHIAL ULTRASOUND N/A 05/16/2019   Procedure: VIDEO BRONCHOSCOPY WITH ENDOBRONCHIAL ULTRASOUND;  Surgeon: Marshell Garfinkel, MD;  Location: [facility];  Service: Pulmonary;   Laterality: N/A;    REVIEW OF SYSTEMS:   Review of Systems  Constitutional: Negative for appetite change, chills, fatigue, fever and unexpected weight change.  HENT: Negative for mouth sores, nosebleeds, sore throat and trouble swallowing.   Eyes: Negative for eye problems and icterus.  Respiratory: Positive for cough (improved). Negative for hemoptysis, shortness of breath and wheezing.  Cardiovascular: Negative for chest pain and leg swelling.  Gastrointestinal: Negative for abdominal pain, constipation, diarrhea, nausea and vomiting.  Genitourinary: Negative for bladder incontinence, difficulty urinating, dysuria, frequency and hematuria.   Musculoskeletal: Negative for back pain, gait problem, neck pain and neck stiffness.  Skin: Negative for itching and rash.  Neurological: Negative for dizziness, extremity weakness, gait problem, headaches, light-headedness and seizures.  Hematological: Negative for adenopathy. Does not bruise/bleed easily.  Psychiatric/Behavioral: Negative for confusion, depression and sleep disturbance. The patient is not nervous/anxious.     PHYSICAL EXAMINATION:  Blood pressure 122/74, pulse (!) 104, temperature 98.2 F (36.8 C), temperature source Temporal, resp. rate 16, height 6\' 4"  (1.93 m), weight 177 lb 3.2 oz (80.4 kg), SpO2 98 %.  ECOG PERFORMANCE STATUS: 1 - Symptomatic but completely ambulatory  Physical Exam  Constitutional: Oriented to person, place, and time and well-developed, well-nourished, and in no distress.  HENT:  Head: Normocephalic and atraumatic.  Mouth/Throat: Oropharynx is clear and moist. No oropharyngeal exudate.  Eyes: Conjunctivae are normal. Right eye exhibits no discharge. Left eye exhibits no discharge. No scleral icterus.  Neck: Normal range of motion. Neck supple.  Cardiovascular: Normal rate, regular rhythm, normal heart sounds and intact distal pulses.   Pulmonary/Chest: Effort normal and breath sounds normal. No  respiratory distress. No wheezes. No rales.  Abdominal: Soft. Bowel sounds are normal. Exhibits no distension and no mass. There is no tenderness.  Musculoskeletal: Normal range of motion. Exhibits no edema.  Lymphadenopathy:    No cervical adenopathy.  Neurological: Alert and oriented to person, place, and time. Exhibits normal muscle tone. Gait normal. Coordination normal.  Skin: Skin is warm and dry. No rash noted. Not diaphoretic. No erythema. No pallor.  Psychiatric: Mood, memory and judgment normal.  Vitals reviewed.  LABORATORY DATA: Lab Results  Component Value Date   WBC 7.4 09/04/2019   HGB 11.8 (L) 09/04/2019   HCT 35.2 (L) 09/04/2019   MCV 97.2 09/04/2019   PLT 148 (L) 09/04/2019      Chemistry      Component Value Date/Time   NA 139 09/04/2019 1426   K 4.1 09/04/2019 1426   CL 105 09/04/2019 1426   CO2 24 09/04/2019 1426   BUN 19 09/04/2019 1426  CREATININE 0.77 09/04/2019 1426   CREATININE 0.80 02/27/2014 1152      Component Value Date/Time   CALCIUM 9.1 09/04/2019 1426   ALKPHOS 84 09/04/2019 1426   AST 12 (L) 09/04/2019 1426   ALT 14 09/04/2019 1426   BILITOT 0.5 09/04/2019 1426       RADIOGRAPHIC STUDIES:  CT Chest W Contrast  Result Date: 08/12/2019 CLINICAL DATA:  Lung ca; chemo and xrt comp x 20 days; wt loss; SOB on exertion; throat pain since XRT comp^38mL OMNIPAQUE IOHEXOL 300 MG/ML SOLNNon-small cell lung cancer, staging EXAM: CT CHEST WITH CONTRAST TECHNIQUE: Multidetector CT imaging of the chest was performed during intravenous contrast administration. CONTRAST:  22mL OMNIPAQUE IOHEXOL 300 MG/ML  SOLN COMPARISON:  CT 07/01/2019, PET-CT 05/15/2019 FINDINGS: Cardiovascular: Coronary artery calcification and aortic atherosclerotic calcification. Mediastinum/Nodes: Prevascular lymph node anterior to the RIGHT brachiocephalic vein measures 12 mm compared to 12 mm. RIGHT lower paratracheal lymph node measures 12 mm compared to 13 mm. No new  adenopathy. Lungs/Pleura: RIGHT perihilar thickening consolidation is similar prior. Postobstructive pneumonitis in the RIGHT middle lobe is slightly increased. New linear nodularity in the RIGHT middle lobe measuring 24 x 11 mm (image 90/7). New patchy ground-glass densities in the RIGHT upper lobe and RIGHT lower lobe. LEFT lung is clear. Upper Abdomen: Limited view of the liver, kidneys, pancreas are unremarkable. Normal adrenal glands. Musculoskeletal: No aggressive osseous lesion. IMPRESSION: 1. Increase in RIGHT lung postobstructive pneumonitis, perihilar nodularity and ground-glass densities in the RIGHT upper lobe and RIGHT lower lobe. Differential would include progressive radiation change, radiation change with superimposed pulmonary infection, or tumor recurrence. Favor radiation change with superimposed infection. Unilateral drug reaction action is unlikely. 2. Stable mediastinal lymphadenopathy. These results will be called to the ordering clinician or representative by the Radiologist Assistant, and communication documented in the PACS or zVision Dashboard. Electronically Signed   By: Suzy Bouchard M.D.   On: 08/12/2019 15:25   ECHOCARDIOGRAM COMPLETE  Result Date: 09/02/2019   ECHOCARDIOGRAM REPORT   Patient Name:   DANIELL MANCINAS Date of Exam: 09/02/2019 Medical Rec #:  [id number]        Height:       76.0 in Accession #:    [id number]       Weight:       176.4 lb Date of Birth:  [birth year]         BSA:          2.10 m Patient Age:    9 years         BP:           109/73 mmHg Patient Gender: M                HR:           84 bpm. Exam Location:  [facility] Procedure: 2D Echo, 3D Echo, Cardiac Doppler, Color Doppler and Strain Analysis Indications:    I25.1 CAD  History:        Patient has prior history of Echocardiogram examinations, most                 recent 07/18/2018. CAD and Previous Myocardial Infarction, Mitral                 Valve Prolapse, Arrythmias:SVT; Risk Factors:Hypertension  and                 Dyslipidemia. Mitral Valve: annuloplasty ring repair valve is  present in the mitral position. Mitral valve repair.  Sonographer:    [place] Referring Phys: [place]  1. Left ventricular ejection fraction, by visual estimation, is 40 to 45%. The left ventricle has mildly decreased function. There is no left ventricular hypertrophy.  2. Left ventricular diastolic parameters are consistent with Grade I diastolic dysfunction (impaired relaxation).  3. The left ventricle has no regional wall motion abnormalities.  4. Global right ventricle has normal systolic function.The right ventricular size is normal. No increase in right ventricular wall thickness.  5. Left atrial size was normal.  6. Right atrial size was normal.  7. The mitral valve has been repaired/replaced. Trivial mitral valve regurgitation.  8. The tricuspid valve is grossly normal.  9. The tricuspid valve is grossly normal. Tricuspid valve regurgitation is trivial. 10. The aortic valve is tricuspid. Aortic valve regurgitation is not visualized. 11. The pulmonic valve was grossly normal. Pulmonic valve regurgitation is not visualized. 12. Aortic dilatation noted. 13. 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 14. The atrial septum is grossly normal. 15. The average left ventricular global longitudinal strain is -20.2 %. In comparison to the previous echocardiogram(s): 07/18/18 EF 50-55%. MV 13mmHg. FINDINGS  Left Ventricle: Left ventricular ejection fraction, by visual estimation, is 40 to 45%. The left ventricle has mildly decreased function. The average left ventricular global longitudinal strain is -20.2 %. The left ventricle has no regional wall motion abnormalities. There is no left ventricular hypertrophy. Left ventricular diastolic parameters are consistent with Grade I diastolic dysfunction (impaired relaxation). Right Ventricle: The right  ventricular size is normal. No increase in right ventricular wall thickness. Global RV systolic function is has normal systolic function. Left Atrium: Left atrial size was normal in size. Right Atrium: Right atrial size was normal in size Pericardium: There is no evidence of pericardial effusion. Mitral Valve: The mitral valve has been repaired/replaced. Trivial mitral valve regurgitation. MV peak gradient, 11.8 mmHg. Tricuspid Valve: The tricuspid valve is grossly normal. Tricuspid valve regurgitation is trivial. Aortic Valve: The aortic valve is tricuspid. Aortic valve regurgitation is not visualized. Pulmonic Valve: The pulmonic valve was grossly normal. Pulmonic valve regurgitation is not visualized. Pulmonic regurgitation is not visualized. Aorta: Aortic dilatation noted. 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 IAS/Shunts: The atrial septum is grossly normal.  LEFT VENTRICLE PLAX 2D LVIDd:         4.20 cm  Diastology LVIDs:         3.15 cm  LV e' lateral:   6.83 cm/s LV PW:         1.00 cm  LV E/e' lateral: 14.1 LV IVS:        0.90 cm  LV e' medial:    5.03 cm/s LVOT diam:     2.20 cm  LV E/e' medial:  19.1 LV SV:         39 ml LV SV Index:   18.98    2D Longitudinal Strain LVOT Area:     3.80 cm 2D Strain GLS (A2C):   -20.6 %                         2D Strain GLS (A3C):   -20.7 %                         2D Strain GLS (  A4C):   -19.4 %                         2D Strain GLS Avg:     -20.2 %                          3D Volume EF:                         3D EF:        55 %                         LV EDV:       127 ml                         LV ESV:       58 ml                         LV SV:        70 ml RIGHT VENTRICLE RV Basal diam:  3.50 cm RV S prime:     11.30 cm/s TAPSE (M-mode): 2.3 cm LEFT ATRIUM             Index       RIGHT ATRIUM           Index LA diam:        3.90 cm 1.86 cm/m  RA Pressure: 3.00 mmHg LA Vol (A2C):   63.9 ml 30.43 ml/m RA Area:     12.90 cm LA Vol  (A4C):   41.1 ml 19.57 ml/m RA Volume:   29.60 ml  14.10 ml/m LA Biplane Vol: 53.7 ml 25.57 ml/m  AORTIC VALVE LVOT Vmax:   81.30 cm/s LVOT Vmean:  52.800 cm/s LVOT VTI:    0.138 m  AORTA Ao Root diam: 4.00 cm Ao Asc diam:  3.80 cm MITRAL VALVE                         TRICUSPID VALVE MV Area (PHT): 4.31 cm              Estimated RAP:  3.00 mmHg MV Peak grad:  11.8 mmHg MV Mean grad:  5.0 mmHg              SHUNTS MV Vmax:       1.72 m/s              Systemic VTI:  0.14 m MV Vmean:      97.1 cm/s             Systemic Diam: 2.20 cm MV VTI:        0.35 m MV PHT:        51.04 msec MV Decel Time: 176 msec MV E velocity: 96.10 cm/s  103 cm/s MV A velocity: 152.00 cm/s 70.3 cm/s MV E/A ratio:  0.63        1.5  Mertie Moores MD Electronically signed by Mertie Moores MD Signature Date/Time: 09/02/2019/2:43:16 PM    Final      ASSESSMENT/PLAN:  This is a very pleasant 64 year old Caucasian male diagnosed with stage IIIb non-small cell lung cancer, adenocarcinoma.  He presented with a right lower lobe lung mass and right hilar lymphadenopathy and ipsilateral and contralateral  mediastinal lymphadenopathy.  He has no actionable mutations.  He was diagnosed in October 2020.  He is status post 7 cycles of concurrent chemoradiation with carboplatin for an AUC of 2 and paclitaxel 45 mg/m2. He experienced odynophagia, dysphagia, and radiation induced esophagitis. He was placed on a prednisone taper and is almost finished with his course. He is feeling better at this time.    The patient will proceed with cycle #1 of Imfinzi today as scheduled.   We will see him back in 2 weeks for evaluation and to see how his first cycle of treatment went.  The patient was advised to call immediately if he has any concerning symptoms in the interval. The patient voices understanding of current disease status and treatment options and is in agreement with the current care plan. All questions were answered. The patient knows to call  the clinic with any problems, questions or concerns. We can certainly see the patient much sooner if necessary  Orders Placed This Encounter  Procedures  . CBC with Differential (Cancer Center Only)    Standing Status:   Standing    Number of Occurrences:   13    Standing Expiration Date:   09/03/2020  . CMP ([facility] only)    Standing Status:   Standing    Number of Occurrences:   13    Standing Expiration Date:   09/03/2020  . TSH    Standing Status:   Standing    Number of Occurrences:   13    Standing Expiration Date:   09/03/2020     Laiyah Exline L Trinia Georgi, PA-C 09/04/19

## 2019-09-03 NOTE — Telephone Encounter (Signed)
*  STAT* If patient is at the pharmacy, call can be transferred to refill team.   1. Which medications need to be refilled? (please list name of each medication and dose if known)  Ambien  2. Which pharmacy/location (including street and city if local pharmacy) is medication to be sent to? [place], [place]-  3. Do they need a 30 day or 90 day supply?whatever he usually gets

## 2019-09-04 ENCOUNTER — Inpatient Hospital Stay: Payer: Federal, State, Local not specified - PPO | Attending: Internal Medicine | Admitting: Physician Assistant

## 2019-09-04 ENCOUNTER — Encounter: Payer: Self-pay | Admitting: Physician Assistant

## 2019-09-04 ENCOUNTER — Other Ambulatory Visit: Payer: Self-pay

## 2019-09-04 ENCOUNTER — Inpatient Hospital Stay: Payer: Federal, State, Local not specified - PPO

## 2019-09-04 VITALS — HR 99

## 2019-09-04 VITALS — BP 122/74 | HR 104 | Temp 98.2°F | Resp 16 | Ht 76.0 in | Wt 177.2 lb

## 2019-09-04 DIAGNOSIS — C3491 Malignant neoplasm of unspecified part of right bronchus or lung: Secondary | ICD-10-CM | POA: Diagnosis not present

## 2019-09-04 DIAGNOSIS — Z79899 Other long term (current) drug therapy: Secondary | ICD-10-CM | POA: Insufficient documentation

## 2019-09-04 DIAGNOSIS — C3431 Malignant neoplasm of lower lobe, right bronchus or lung: Secondary | ICD-10-CM | POA: Diagnosis present

## 2019-09-04 DIAGNOSIS — Z5112 Encounter for antineoplastic immunotherapy: Secondary | ICD-10-CM | POA: Diagnosis present

## 2019-09-04 LAB — CMP (CANCER CENTER ONLY)
ALT: 14 U/L (ref 0–44)
AST: 12 U/L — ABNORMAL LOW (ref 15–41)
Albumin: 3.5 g/dL (ref 3.5–5.0)
Alkaline Phosphatase: 84 U/L (ref 38–126)
Anion gap: 10 (ref 5–15)
BUN: 19 mg/dL (ref 8–23)
CO2: 24 mmol/L (ref 22–32)
Calcium: 9.1 mg/dL (ref 8.9–10.3)
Chloride: 105 mmol/L (ref 98–111)
Creatinine: 0.77 mg/dL (ref 0.61–1.24)
GFR, Est AFR Am: >60 mL/min (ref >60)
GFR, Estimated: >60 mL/min (ref >60)
Glucose, Bld: 179 mg/dL — ABNORMAL HIGH (ref 70–99)
Potassium: 4.1 mmol/L (ref 3.5–5.1)
Sodium: 139 mmol/L (ref 135–145)
Total Bilirubin: 0.5 mg/dL (ref 0.3–1.2)
Total Protein: 6.6 g/dL (ref 6.5–8.1)

## 2019-09-04 LAB — CBC WITH DIFFERENTIAL (CANCER CENTER ONLY)
Abs Immature Granulocytes: 0.04 10*3/uL (ref 0.00–0.07)
Basophils Absolute: 0 10*3/uL (ref 0.0–0.1)
Basophils Relative: 0 %
Eosinophils Absolute: 0.2 10*3/uL (ref 0.0–0.5)
Eosinophils Relative: 2 %
HCT: 35.2 % — ABNORMAL LOW (ref 39.0–52.0)
Hemoglobin: 11.8 g/dL — ABNORMAL LOW (ref 13.0–17.0)
Immature Granulocytes: 1 %
Lymphocytes Relative: 3 %
Lymphs Abs: 0.2 10*3/uL — ABNORMAL LOW (ref 0.7–4.0)
MCH: 32.6 pg (ref 26.0–34.0)
MCHC: 33.5 g/dL (ref 30.0–36.0)
MCV: 97.2 fL (ref 80.0–100.0)
Monocytes Absolute: 0.3 10*3/uL (ref 0.1–1.0)
Monocytes Relative: 4 %
Neutro Abs: 6.6 10*3/uL (ref 1.7–7.7)
Neutrophils Relative %: 90 %
Platelet Count: 148 10*3/uL — ABNORMAL LOW (ref 150–400)
RBC: 3.62 MIL/uL — ABNORMAL LOW (ref 4.22–5.81)
RDW: 14.9 % (ref 11.5–15.5)
WBC Count: 7.4 10*3/uL (ref 4.0–10.5)
nRBC: 0 % (ref 0.0–0.2)

## 2019-09-04 LAB — TSH: TSH: 0.677 u[IU]/mL (ref 0.320–4.118)

## 2019-09-04 MED ORDER — SODIUM CHLORIDE 0.9 % IV SOLN
1500.0000 mg | Freq: Once | INTRAVENOUS | Status: AC
  Administered 2019-09-04: 1500 mg via INTRAVENOUS
  Filled 2019-09-04: qty 30

## 2019-09-04 MED ORDER — SODIUM CHLORIDE 0.9 % IV SOLN
Freq: Once | INTRAVENOUS | Status: AC
  Filled 2019-09-04: qty 250

## 2019-09-04 NOTE — Patient Instructions (Signed)
Durvalumab injection What is this medicine? DURVALUMAB (dur VAL ue mab) is a monoclonal antibody. It is used to treat urothelial cancer and lung cancer. This medicine may be used for other purposes; ask your health care provider or pharmacist if you have questions. COMMON BRAND NAME(S): IMFINZI What should I tell my health care provider before I take this medicine? They need to know if you have any of these conditions:  diabetes  immune system problems  infection  inflammatory bowel disease  kidney disease  liver disease  lung or breathing disease  lupus  organ transplant  stomach or intestine problems  thyroid disease  an unusual or allergic reaction to durvalumab, other medicines, foods, dyes, or preservatives  pregnant or trying to get pregnant  breast-feeding How should I use this medicine? This medicine is for infusion into a vein. It is given by a health care professional in a hospital or clinic setting. A special MedGuide will be given to you before each treatment. Be sure to read this information carefully each time. Talk to your pediatrician regarding the use of this medicine in children. Special care may be needed. Overdosage: If you think you have taken too much of this medicine contact a poison control center or emergency room at once. NOTE: This medicine is only for you. Do not share this medicine with others. What if I miss a dose? It is important not to miss your dose. Call your doctor or health care professional if you are unable to keep an appointment. What may interact with this medicine? Interactions have not been studied. This list may not describe all possible interactions. Give your health care provider a list of all the medicines, herbs, non-prescription drugs, or dietary supplements you use. Also tell them if you smoke, drink alcohol, or use illegal drugs. Some items may interact with your medicine. What should I watch for while using this  medicine? This drug may make you feel generally unwell. Continue your course of treatment even though you feel ill unless your doctor tells you to stop. You may need blood work done while you are taking this medicine. Do not become pregnant while taking this medicine or for 3 months after stopping it. Women should inform their doctor if they wish to become pregnant or think they might be pregnant. There is a potential for serious side effects to an unborn child. Talk to your health care professional or pharmacist for more information. Do not breast-feed an infant while taking this medicine or for 3 months after stopping it. What side effects may I notice from receiving this medicine? Side effects that you should report to your doctor or health care professional as soon as possible:  allergic reactions like skin rash, itching or hives, swelling of the face, lips, or tongue  black, tarry stools  bloody or watery diarrhea  breathing problems  change in emotions or moods  change in sex drive  changes in vision  chest pain or chest tightness  chills  confusion  cough  facial flushing  fever  headache  signs and symptoms of high blood sugar such as dizziness; dry mouth; dry skin; fruity breath; nausea; stomach pain; increased hunger or thirst; increased urination  signs and symptoms of liver injury like dark yellow or brown urine; general ill feeling or flu-like symptoms; light-colored stools; loss of appetite; nausea; right upper belly pain; unusually weak or tired; yellowing of the eyes or skin  stomach pain  trouble passing urine or change in  the amount of urine  weight gain or weight loss Side effects that usually do not require medical attention (report these to your doctor or health care professional if they continue or are bothersome):  bone pain  constipation  loss of appetite  muscle pain  nausea  swelling of the ankles, feet, hands  tiredness This list  may not describe all possible side effects. Call your doctor for medical advice about side effects. You may report side effects to FDA at 1-800-FDA-1088. Where should I keep my medicine? This drug is given in a hospital or clinic and will not be stored at home. NOTE: This sheet is a summary. It may not cover all possible information. If you have questions about this medicine, talk to your doctor, pharmacist, or health care provider.  2020 Elsevier/Gold Standard (2016-10-10 19:25:04)

## 2019-09-05 ENCOUNTER — Telehealth: Payer: Self-pay | Admitting: Cardiology

## 2019-09-05 MED ORDER — METOPROLOL SUCCINATE ER 25 MG PO TB24: 25.0000 mg | ORAL_TABLET | Freq: Every day | ORAL | 3 refills | Status: DC

## 2019-09-05 NOTE — Telephone Encounter (Signed)
Spoke with patient and he states he is currently taking Toprol 25 mg daily. Blood pressure running around HR mid to upper 80's. Las Afib episode on 07/09/2019, last radiation treatment 07/21/2019. Per patient Afib was thought to be brought on by radiation. Advised patient to continue and keep follow up 2/10. Will forward to Dr Ellyn Hack for review

## 2019-09-05 NOTE — Telephone Encounter (Signed)
So I think it makes sense for him to stay on the metoprolol.  He has a history of A. fib and history of coronary artery disease.  I know he is reluctant to take medications so he can stop it if he wants to.  But if he is can to stop he needs to wean off of it by taking half tablet twice a day for couple weeks and then half tablet once a day for couple weeks before he stops.  Glenetta Hew, MD

## 2019-09-05 NOTE — Telephone Encounter (Signed)
Patient states that he is inquiring as to whether he should continue taking Metoprolol medication or not. Patient states he has not been in afib and BP is stable, due to completion of therapy. Please call and advise.

## 2019-09-05 NOTE — Telephone Encounter (Signed)
Spoke with pharmacy and the max amount insurance will cover is 60 tablets in 365 days without override by MD. Pharmacy faxed  Information, if do not receive try calling [phone]

## 2019-09-05 NOTE — Telephone Encounter (Signed)
Pt's medication has already been addressed.

## 2019-09-06 ENCOUNTER — Emergency Department (HOSPITAL_COMMUNITY)
Admission: EM | Admit: 2019-09-06 | Discharge: 2019-09-06 | Disposition: A | Discharge status: Home / Self Care | Payer: Federal, State, Local not specified - PPO | Attending: Emergency Medicine | Admitting: Emergency Medicine

## 2019-09-06 ENCOUNTER — Other Ambulatory Visit: Payer: Self-pay

## 2019-09-06 ENCOUNTER — Encounter (HOSPITAL_COMMUNITY): Payer: Self-pay | Admitting: Emergency Medicine

## 2019-09-06 DIAGNOSIS — I471 Supraventricular tachycardia: Secondary | ICD-10-CM | POA: Diagnosis not present

## 2019-09-06 DIAGNOSIS — Z9861 Coronary angioplasty status: Secondary | ICD-10-CM | POA: Insufficient documentation

## 2019-09-06 DIAGNOSIS — I10 Essential (primary) hypertension: Secondary | ICD-10-CM | POA: Diagnosis not present

## 2019-09-06 DIAGNOSIS — R002 Palpitations: Secondary | ICD-10-CM | POA: Diagnosis present

## 2019-09-06 DIAGNOSIS — I2510 Atherosclerotic heart disease of native coronary artery without angina pectoris: Secondary | ICD-10-CM | POA: Diagnosis not present

## 2019-09-06 DIAGNOSIS — C3490 Malignant neoplasm of unspecified part of unspecified bronchus or lung: Secondary | ICD-10-CM | POA: Insufficient documentation

## 2019-09-06 DIAGNOSIS — I252 Old myocardial infarction: Secondary | ICD-10-CM | POA: Diagnosis not present

## 2019-09-06 LAB — CBC WITH DIFFERENTIAL/PLATELET
Abs Immature Granulocytes: 0.03 10*3/uL (ref 0.00–0.07)
Basophils Absolute: 0 10*3/uL (ref 0.0–0.1)
Basophils Relative: 0 %
Eosinophils Absolute: 0.3 10*3/uL (ref 0.0–0.5)
Eosinophils Relative: 5 %
HCT: 37.1 % — ABNORMAL LOW (ref 39.0–52.0)
Hemoglobin: 11.8 g/dL — ABNORMAL LOW (ref 13.0–17.0)
Immature Granulocytes: 1 %
Lymphocytes Relative: 2 %
Lymphs Abs: 0.1 10*3/uL — ABNORMAL LOW (ref 0.7–4.0)
MCH: 33 pg (ref 26.0–34.0)
MCHC: 31.8 g/dL (ref 30.0–36.0)
MCV: 103.6 fL — ABNORMAL HIGH (ref 80.0–100.0)
Monocytes Absolute: 0.5 10*3/uL (ref 0.1–1.0)
Monocytes Relative: 7 %
Neutro Abs: 5.5 10*3/uL (ref 1.7–7.7)
Neutrophils Relative %: 85 %
Platelets: 138 10*3/uL — ABNORMAL LOW (ref 150–400)
RBC: 3.58 MIL/uL — ABNORMAL LOW (ref 4.22–5.81)
RDW: 14.6 % (ref 11.5–15.5)
WBC: 6.4 10*3/uL (ref 4.0–10.5)
nRBC: 0 % (ref 0.0–0.2)

## 2019-09-06 LAB — MAGNESIUM: Magnesium: 1.9 mg/dL (ref 1.7–2.4)

## 2019-09-06 LAB — COMPREHENSIVE METABOLIC PANEL
ALT: 25 U/L (ref 0–44)
AST: 30 U/L (ref 15–41)
Albumin: 3.2 g/dL — ABNORMAL LOW (ref 3.5–5.0)
Alkaline Phosphatase: 75 U/L (ref 38–126)
Anion gap: 11 (ref 5–15)
BUN: 16 mg/dL (ref 8–23)
CO2: 23 mmol/L (ref 22–32)
Calcium: 8.8 mg/dL — ABNORMAL LOW (ref 8.9–10.3)
Chloride: 104 mmol/L (ref 98–111)
Creatinine, Ser: 0.79 mg/dL (ref 0.61–1.24)
GFR calc Af Amer: >60 mL/min (ref >60)
GFR calc non Af Amer: >60 mL/min (ref >60)
Glucose, Bld: 113 mg/dL — ABNORMAL HIGH (ref 70–99)
Potassium: 4 mmol/L (ref 3.5–5.1)
Sodium: 138 mmol/L (ref 135–145)
Total Bilirubin: 0.9 mg/dL (ref 0.3–1.2)
Total Protein: 6.1 g/dL — ABNORMAL LOW (ref 6.5–8.1)

## 2019-09-06 LAB — BRAIN NATRIURETIC PEPTIDE: B Natriuretic Peptide: 52 pg/mL (ref 0.0–100.0)

## 2019-09-06 LAB — TSH: TSH: 1.114 u[IU]/mL (ref 0.350–4.500)

## 2019-09-06 MED ORDER — METOPROLOL TARTRATE 25 MG PO TABS: 50.0000 mg | ORAL_TABLET | Freq: Two times a day (BID) | ORAL | 0 refills | Status: DC

## 2019-09-06 MED ORDER — SODIUM CHLORIDE 0.9 % IV BOLUS
500.0000 mL | Freq: Once | INTRAVENOUS | Status: AC
  Administered 2019-09-06: 11:00:00 500 mL via INTRAVENOUS

## 2019-09-06 MED ORDER — ADENOSINE 6 MG/2ML IV SOLN
INTRAVENOUS | Status: AC
  Filled 2019-09-06: qty 4

## 2019-09-06 MED ORDER — METOPROLOL TARTRATE 25 MG PO TABS: 25.0000 mg | ORAL_TABLET | Freq: Two times a day (BID) | ORAL | 0 refills | Status: DC

## 2019-09-06 MED ORDER — ADENOSINE 6 MG/2ML IV SOLN
INTRAVENOUS | Status: AC
  Filled 2019-09-06: qty 6

## 2019-09-06 MED ORDER — METOPROLOL TARTRATE 25 MG PO TABS
25.0000 mg | ORAL_TABLET | Freq: Once | ORAL | Status: AC
  Administered 2019-09-06: 11:00:00 25 mg via ORAL
  Filled 2019-09-06: qty 1

## 2019-09-06 NOTE — ED Notes (Signed)
Pt discharge instructions and prescription change reviewed with the patient. The patient verbalized understanding. Pt discharged.

## 2019-09-06 NOTE — ED Provider Notes (Signed)
Emergency Department Provider Note   I have reviewed the triage vital signs and the nursing notes.   HISTORY  Chief Complaint SVT   HPI Alan [name] is a 64 y.o. 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  He was out walking his dog when this began.  He went home and checked his blood pressure and heart rate registered as 190 on his machine.  EMS arrived to find his heart rate in the 210-215 bpm range.  IV access was established on scene and patient was given 6 mg of adenosine with no change followed by 12 mg with pause and return to normal sinus rhythm.    Past Medical History:  Diagnosis Date  . CAD S/P percutaneous coronary angioplasty 2004   which showed essentially normal LV size and function, moderately dilated left atrium, moderate mitral prolapse with mild to moderate regurgitation.  . Dyslipidemia, goal LDL below 70   . Essential hypertension   . GERD (gastroesophageal reflux disease)   . History of Mitral valve prolapse    Moderate - with moderate MR, noted February t 2013  . History of Severe mitral regurgitation by prior echocardiogram 02/06/2014   TEE: Severe mitral regurgitation with a flail P2 segment and ruptured; Normal LV size & function, dilated LA.  Marland Kitchen History of stress test 12/2009   he walked 9 mins reaching 10 METS. There was an attenuation artifact in the inferior region but no ischemia or infaract, low risk.  . Incidental lung nodule, greater than or equal to 40mm 03/16/2014   Ground glass opacity RML noted on CT scan  . lung ca dx'd 03/2019  . PONV (postoperative nausea and vomiting)   . 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 ST elevation myocardial infarction (STEMI) of anterior wall, subsequent episode of care [facility]) 2004   he had a proximal LAD occlusion treated with 2 overlapping 3.5 x 1.8 mm Cypher DES stents.    Patient Active Problem List   Diagnosis Date Noted  . Encounter for antineoplastic immunotherapy 08/13/2019  . SVT (supraventricular tachycardia) ([place]) 07/01/2019  . Elevated troponin level 07/01/2019  . Adenocarcinoma of right lung, stage 3 ([place]) 05/29/2019  . Goals of care, counseling/discussion 05/29/2019  . Encounter for antineoplastic chemotherapy 05/29/2019  . Lung mass   . S/P minimally invasive mitral valve repair 04/08/2014  . Incidental lung nodule, greater than or equal to 15mm 03/16/2014  . CAD S/P percutaneous coronary angioplasty - DES PCI to occluded LAD during anterior STEMI 11/29/2013  . History of Severe mitral regurgitation 11/29/2013  . History of Severe Mitral valve prolapse - severe prolapse of the posterior (P2) leaflet with severe MR 11/29/2013  . Essential hypertension   . Dyslipidemia, goal LDL below 70   . METATARSALGIA 03/24/2009    Past Surgical History:  Procedure Laterality Date  . ANTERIOR CRUCIATE LIGAMENT REPAIR Left 1993  . CARDIAC CATHETERIZATION  2004   he had a proximal LAD occlusion treated with 2 overlapping 3.5 x 1.8 mm Cypher DES stents.  Marland Kitchen CARDIAC CATHETERIZATION  2007; 03/2014   Widely patent LAD stents, 40% distal LAD, 30%  Cx, ~50% PL-PDA bifurcation lesion   . COLONOSCOPY    . INTRAOPERATIVE TRANSESOPHAGEAL ECHOCARDIOGRAM N/A 04/08/2014   Procedure: INTRAOPERATIVE TRANSESOPHAGEAL ECHOCARDIOGRAM;  Surgeon: Rexene Alberts, MD;  Location: [place];  Service: Open Heart Surgery;  Laterality: N/A;  . LEFT AND RIGHT HEART CATHETERIZATION WITH CORONARY ANGIOGRAM N/A 03/05/2014   Procedure: LEFT AND RIGHT HEART CATHETERIZATION WITH CORONARY ANGIOGRAM;   Surgeon: Leonie Man, MD;  Location: [facility] CATH LAB;  Service: Cardiovascular;  Laterality: N/A;  . LEFT HEART CATHETERIZATION WITH CORONARY ANGIOGRAM N/A 09/15/2011   Procedure: LEFT HEART CATHETERIZATION WITH CORONARY ANGIOGRAM;  Surgeon: Lorretta Harp, MD;  Location: [facility] CATH LAB;  Service: Cardiovascular;  Laterality: N/A;  . MITRAL VALVE REPAIR Right 04/08/2014   Procedure: MINIMALLY INVASIVE MITRAL VALVE REPAIR (MVR);  Surgeon: Rexene Alberts, MD;  Location: [place];  Service: Open Heart Surgery;  Laterality: Right;  . NM MYOVIEW LTD  May 2011   Walk 9 minutes, and 10 METs, diaphragmatic attenuation but no ischemia or infarction.  . TEE WITHOUT CARDIOVERSION N/A 02/06/2014   Procedure: TRANSESOPHAGEAL ECHOCARDIOGRAM (TEE);  Surgeon: Pixie Casino, MD;  Normal LV Size U& function - EF 55-60%, no regional WMA.  MV P2 Leaflet is flail with ruptured chord with severe prolapse, anterior leaflet intact.  Severe, eccentric anterior directed MR with dilated LA.  Marland Kitchen TRANSTHORACIC ECHOCARDIOGRAM  07/18/2018   Normal LV size and function.  EF 50-60 %.  Unable to assess diastolic function.  Mitral valve sewing ring in place.  Mild stenosis with a gradient of 6 mmHg noted. -  Domingo Dimes ECHOCARDIOGRAM  June 30 2015   Low normal LV function with EF 50-55%. GR 1 DD. No MR. Normal gradient of 4 mmHg the mitral valve. No prolapse.  Marland Kitchen VIDEO BRONCHOSCOPY WITH ENDOBRONCHIAL ULTRASOUND N/A 05/16/2019   Procedure: VIDEO BRONCHOSCOPY WITH ENDOBRONCHIAL ULTRASOUND;  Surgeon: Marshell Garfinkel, MD;  Location: [place];  Service: Pulmonary;  Laterality: N/A;    Allergies A-cillin [ampicillin], Amoxicillin, Other, and Penicillins  Family History  Problem Relation Age of Onset  . Hypertension Mother   . Heart Problems Father        triple bypass 1989  . Cancer Paternal Grandmother        Pancreatic    Social History Social History   Tobacco Use  . Smoking status: Never Smoker  . Smokeless tobacco:  Never Used  Substance Use Topics  . Alcohol use: Yes    Alcohol/week: 10.0 standard drinks    Types: 5 Cans of beer, 5 Shots of liquor per week    Comment: social  . Drug use: No    Review of Systems  Constitutional: No fever/chills Eyes: No visual changes. ENT: No sore throat. Cardiovascular: Denies chest pain. Positive palpitations and lightheadedness.  Respiratory: Denies shortness of breath. Gastrointestinal: No abdominal pain.  No nausea, no vomiting.  No diarrhea.  No constipation. Genitourinary: Negative for dysuria. Musculoskeletal: Negative for back pain. Skin: Negative for rash. Neurological: Negative for headaches, focal weakness or numbness.   10-point ROS otherwise negative.  ____________________________________________   PHYSICAL EXAM:  VITAL SIGNS: ED Triage Vitals [09/06/19 1100]  Enc Vitals Group     BP (!) 123/91     Pulse Rate (!) 114     Resp 16     Temp 97.8 F (36.6 C)     Temp Source Oral     SpO2 97 %     Weight 170 lb (77.1 kg)  Height 6\' 4"  (1.93 m)   Constitutional: Alert and oriented. Patient appears anxious and unwell on arrival but able to follow commands and provide a brief history.  Eyes: Conjunctivae are normal. Head: Atraumatic. Nose: No congestion/rhinnorhea. Mouth/Throat: Mucous membranes are moist.  Neck: No stridor.   Cardiovascular: SVT (rate 230s). Good peripheral circulation. Grossly normal heart sounds.   Respiratory: Normal respiratory effort.  No retractions. Lungs CTAB. Gastrointestinal: No distention.  Musculoskeletal: No gross deformities of extremities. Neurologic:  Normal speech and language. Skin:  Skin is warm, dry and intact. No rash noted.  ____________________________________________   LABS (all labs ordered are listed, but only abnormal results are displayed)  Labs Reviewed  COMPREHENSIVE METABOLIC PANEL - Abnormal; Notable for the following components:      Result Value   Glucose, Bld 113 (*)     Calcium 8.8 (*)    Total Protein 6.1 (*)    Albumin 3.2 (*)    All other components within normal limits  CBC WITH DIFFERENTIAL/PLATELET - Abnormal; Notable for the following components:   RBC 3.58 (*)    Hemoglobin 11.8 (*)    HCT 37.1 (*)    MCV 103.6 (*)    Platelets 138 (*)    Lymphs Abs 0.1 (*)    All other components within normal limits  BRAIN NATRIURETIC PEPTIDE  MAGNESIUM  TSH   ____________________________________________  EKG   EKG Interpretation  Date/Time:  Saturday September 06 2019 10:57:55 EST Ventricular Rate:  122 PR Interval:    QRS Duration: 82 QT Interval:  305 QTC Calculation: 435 R Axis:   72 Text Interpretation: Sinus tachycardia LAE, consider biatrial enlargement No STEMI Confirmed by Nanda Quinton [phone]) on 09/06/2019 11:27:09 AM       ____________________________________________  RADIOLOGY  None ____________________________________________   PROCEDURES  Procedure(s) performed:   .Cardioversion  Date/Time: 09/06/2019 6:57 PM Performed by: Margette Fast, MD Authorized by: Margette Fast, MD   Consent:    Consent obtained:  Verbal   Consent given by:  Patient   Risks discussed:  Induced arrhythmia and death Pre-procedure details:    Cardioversion basis:  Emergent   Rhythm:  Supraventricular tachycardia   Electrode placement:  Anterior-posterior Patient sedated: No Attempt one:    Cardioversion mode attempt one: Adenosine 12 mg    Shock outcome:  No change in rhythm Attempt two:    Cardioversion mode attempt two: Adenosine 18 mg.   Shock outcome:  Conversion to normal sinus rhythm Post-procedure details:    Patient status:  Awake   Patient tolerance of procedure:  Tolerated well, no immediate complications .Critical Care Performed by: Margette Fast, MD Authorized by: Margette Fast, MD   Critical care provider statement:    Critical care time (minutes):  35   Critical care time was exclusive of:  Separately billable  procedures and treating other patients and teaching time   Critical care was necessary to treat or prevent imminent or life-threatening deterioration of the following conditions:  Circulatory failure   Critical care was time spent personally by me on the following activities:  Blood draw for specimens, development of treatment plan with patient or surrogate, discussions with consultants, evaluation of patient's response to treatment, examination of patient, obtaining history from patient or surrogate, ordering and performing treatments and interventions, ordering and review of laboratory studies, ordering and review of radiographic studies, pulse oximetry, re-evaluation of patient's condition and review of old charts   I assumed direction of critical care  for this patient from another provider in my specialty: no       ____________________________________________   INITIAL IMPRESSION / ASSESSMENT AND PLAN / ED COURSE  Pertinent labs & imaging results that were available during my care of the patient were reviewed by me and considered in my medical decision making (see chart for details).   Patient arrives to the ED with SVT. Failed to cardiovert with vagal maneuvers. 12 mg Adenosine given with no change. Pause achieved with 18 mg Adenosine and return to sinus rhythm. No CP. Doubt PE. Only new med is Infinzi with last dose 1/21. Will monitor and obtain screening labs.    Labs reviewed.  No electrolyte abnormality.  TSH is within normal range. Magnesium normal. Will reach out to Cardiology on call to discuss med mgmt and f/u vs obs admit.   02:40 PM  Spoke with Dr. Oval Linsey with cardiology reviewed the case with me by phone and patient's med list.  Plan to stop his metoprolol succinate to Lopressor 25 mg BID.  Patient feeling well at time of discharge. No return of SVT.  ____________________________________________  FINAL CLINICAL IMPRESSION(S) / ED DIAGNOSES  Final diagnoses:  SVT  (supraventricular tachycardia) (HCC)     MEDICATIONS GIVEN DURING THIS VISIT:  Medications  adenosine (ADENOCARD) 6 MG/2ML injection (  Given 09/06/19 1100)  adenosine (ADENOCARD) 6 MG/2ML injection (  Given 09/06/19 1100)  sodium chloride 0.9 % bolus 500 mL (0 mLs Intravenous Stopped 09/06/19 1532)  metoprolol tartrate (LOPRESSOR) tablet 25 mg (25 mg Oral Given 09/06/19 1120)     Note:  This document was prepared using Dragon voice recognition software and may include unintentional dictation errors.  Nanda Quinton, MD, [facility]    Terrel Nesheiwat, [facility], MD 09/06/19 [phone]

## 2019-09-06 NOTE — ED Notes (Signed)
Pt ambulated to restroom. No distress noted.

## 2019-09-06 NOTE — ED Triage Notes (Signed)
Pt arrives via [facility] from home where he was walking his dog when he began to feel like he may pass out, went home and checked BP and HR which was 190 BPM, upon ems arrival patient's HR was 210-215, pt was given 6mg  adenosine then 12 mg adenosine with conversion to ST, pt back into SVT upon arrival. Pt has been a/ox4 throughout. bp 140/90 per EMS.

## 2019-09-06 NOTE — ED Notes (Signed)
Upon arrival to ED, patient back in SVT at rate in the 220s, 12mg  adenosine given at 1053 per Dr Hayden Pedro verbal order. MD at bedside, no conversion of cardiac rhythm. Dr. Laverta Baltimore gave verbal order for another 16mg  of adenosine to be given. Patient converted to sinus rhythm after 16mg  administration of adenosine at 1055.   Patient sinus tachycardia on the cardiac monitor with rate in mid to low 100s. Pt remained alert and oriented throughout event. All other VSs stable.

## 2019-09-06 NOTE — Discharge Instructions (Addendum)
You were seen in the emergency department today with abnormal heart rhythm.  After discussion with cardiology we are making some changes to your medications.  I am changing your metoprolol succinate to a different formulation call metoprolol tartrate (Lopressor).  Please take 25 mg tablets twice per day as directed.  The cardiology office will call to schedule a clinic appointment likely sooner than your scheduled appointment in 3 weeks.  If you develop any lightheadedness, almost passing out, chest pain, return of abnormal heart rhythm you should return to the emergency department.

## 2019-09-08 NOTE — Telephone Encounter (Signed)
Patient will discuss at follow up in February

## 2019-09-09 ENCOUNTER — Ambulatory Visit: Payer: Federal, State, Local not specified - PPO | Admitting: Cardiology

## 2019-09-09 ENCOUNTER — Other Ambulatory Visit: Payer: Self-pay

## 2019-09-09 ENCOUNTER — Encounter: Payer: Self-pay | Admitting: Cardiology

## 2019-09-09 ENCOUNTER — Telehealth: Payer: Self-pay | Admitting: Medical Oncology

## 2019-09-09 VITALS — BP 110/72 | HR 96 | Temp 97.1°F | Ht 72.0 in | Wt 176.6 lb

## 2019-09-09 DIAGNOSIS — I471 Supraventricular tachycardia: Secondary | ICD-10-CM | POA: Diagnosis not present

## 2019-09-09 DIAGNOSIS — I10 Essential (primary) hypertension: Secondary | ICD-10-CM | POA: Diagnosis not present

## 2019-09-09 DIAGNOSIS — I341 Nonrheumatic mitral (valve) prolapse: Secondary | ICD-10-CM

## 2019-09-09 DIAGNOSIS — E785 Hyperlipidemia, unspecified: Secondary | ICD-10-CM | POA: Diagnosis not present

## 2019-09-09 DIAGNOSIS — I2510 Atherosclerotic heart disease of native coronary artery without angina pectoris: Secondary | ICD-10-CM

## 2019-09-09 DIAGNOSIS — Z9861 Coronary angioplasty status: Secondary | ICD-10-CM

## 2019-09-09 DIAGNOSIS — Z9889 Other specified postprocedural states: Secondary | ICD-10-CM

## 2019-09-09 DIAGNOSIS — C3491 Malignant neoplasm of unspecified part of right bronchus or lung: Secondary | ICD-10-CM

## 2019-09-09 NOTE — Patient Instructions (Signed)
Medication Instructions:  No changes   may take an extra metoprolol if you have an episode of fast heart rate if last longer than 30 min after taking the extra dose  or symptoms become worse then go  ER best to call 911   *If you need a refill on your cardiac medications before your next appointment, please call your pharmacy*  Lab Work: LIPID CMP in 6 month prior to appointment - fasting  If you have labs (blood work) drawn today and your tests are completely normal, you will receive your results only by: Marland Kitchen MyChart Message (if you have MyChart) OR . A paper copy in the mail If you have any lab test that is abnormal or we need to change your treatment, we will call you to review the results.  Testing/Procedures: Not needed  Follow-Up: At [facility], you and your health needs are our priority.  As part of our continuing mission to provide you with exceptional heart care, we have created designated Provider Care Teams.  These Care Teams include your primary Cardiologist (physician) and Advanced Practice Providers (APPs -  Physician Assistants and Nurse Practitioners) who all work together to provide you with the care you need, when you need it.  Your next appointment:   6 month(s)  The format for your next appointment:   In Person  Provider:   Cherlynn Kaiser, MD  Other Instructions Recommendations for vagal maneuvers:  "Bearing down"  Coughing  Gagging  Icy, cold towel on face or drink ice cold water

## 2019-09-09 NOTE — Telephone Encounter (Signed)
Wound/lesion- sat am woke up with "dime sized boil /bite on Right gluteus maximus. " -painful to touch . Denies itching/fever  Covered with bandage. Message to [facility].

## 2019-09-09 NOTE — Progress Notes (Signed)
Primary Care Provider: [name], L.[name], MD Cardiologist: Glenetta Hew, MD Electrophysiologist:   Clinic Note: Chief Complaint  Patient presents with  . Hospitalization Follow-up    Follow-up ER visit SVT  . Coronary Artery Disease    No angina  . Tachycardia    Recently diagnosed with SVT    HPI:    LENZIE SANDLER is a 64 y.o. male with a PMH notable for CAD having PCI as well as MVR for MVP with severe MR and now new diagnosis of SVT who presents today for hospital follow-up Rustin is recently been diagnosed with lung cancer back in June 2019.  He has recently completed chemotherapy and XRT-he had Monday through Friday radiation therapy with once weekly chemotherapy for 3 months.  Completed in December.  MARLOW BERENGUER was last seen by me in December 2019 for routine annual follow-up.  He was doing well.  Was having some vertigo issues.  Somewhat dehydrated.  No real cardiac symptoms.  No palpitations.  As usual, he asked about potentially being on to stop medications. -> Plan was for him to follow-up with [facility] to discuss medications for lipid management.  He was seen by Jory Sims, NP on July 15, 2019 for presumptive SVT in the setting of lung cancer.  Was started on Toprol 25 mg.  This dose was increased to 50 mg after on July 09, 2019 ER visit for rapid heart rate/SVT.Marland Kitchen  Recent Hospitalizations:   Most current ER 09/06/2019: Presented with acute onset heart rates and lightheadedness.  Currently walking his dog.  Heart rate 190 beats minute is fast at 215 bpm.  No effect from the 6 mg and 12 mg adenosine, but restored sinus rhythm with 18 mg. -->  Converted to Lopressor 25 mg twice daily from Toprol 50 mg.  He has just asked 8 weeks of cancer therapy  Reviewed  CV studies:    The following studies were reviewed today: (if available, images/films reviewed: From Epic Chart or Care Everywhere) . 09/01/2018-TTE:: EF now 40 to 45% with mild decrease in function.   No LVH.  GR 1 DD.  Normal atrial size.  Interval History:   TYTAN SANDATE returns here today in follow-up from his recent hospital/ER visit with SVT that was finally broken using 18 mg of adenosine.  He really did not feel well when he was in the SVT.  His heart rates went up as high as 200bpm.  He felt tunnel vision and dizziness but did not actually feel like he is in a pass out.  He was able to make it home sit down and relax before calling EMS.  He said he felt he needed to take some deep breaths and it was associated with some shortness of breath, but no chest pain or pressure.  Overall he seems to be pretty good spirits having completed his chemotherapy and radiation.  He is now about ready to start his immunotherapy.  He did indicate that the chemotherapy was rough in conjunction with the XRT.  He felt weak tired lethargic and is lost quite a bit of weight.  He basely said that he was eating comfort food when he was able to for start eating and that basically entails having macaroni and cheese and pasta with light sauce.  He really has not felt like eating too much noise of any foods because a sense of taste has not come back yet.  He does feel weak and has less energy but this  seems to be getting better.  He is trying to get back into doing exercise and activity. Really besides these episodes of SVT, he has not had that much in the way of any cardiac symptoms.  Besides having fatigue and some mild exercise tolerance, he has not had any exertional dyspnea or chest.  Even with the SVT episodes, he never had true syncope although he did feel somewhat near syncope.  No TIA or amaurosis fugax. While he initially had not been enjoying the metoprolol, he is able to tolerate it little bit better now.  He says that the twice daily dosing of metoprolol versus the long-acting Toprol has helped his energy level some.  His get up and go is definitely better since making that change.  CV Review of Symptoms  (Summary) Cardiovascular ROS: positive for - dyspnea on exertion, rapid heart rate and He still does get short of breath with exertion but is more just fatigued.  He really is only noted the past heart rates associated with SVT, not really noticing any palpitations. negative for - chest pain, edema, irregular heartbeat, orthopnea, palpitations, paroxysmal nocturnal dyspnea or No syncope, but has had some mild near syncope.  No TIA or amaurosis fugax.  The patient does not have symptoms concerning for COVID-19 infection (fever, chills, cough, or new shortness of breath).  The patient is practicing social distancing & masking.  Is hoping to recover more some from his chemotherapy and radiation and get it through a couple doses of the immune therapy before he considers talking to oncology about Covid vaccination.   REVIEWED OF SYSTEMS   A comprehensive ROS was performed.  Most of the symptoms of fatigue nausea dizziness and poor appetite have improved.  He still has poor appetite but no more nausea or vomiting. Review of Systems  Constitutional: Positive for malaise/fatigue (But getting better) and weight loss. Negative for chills and fever.  HENT: Negative for congestion and nosebleeds.   Respiratory: Negative for cough, sputum production and wheezing.   Cardiovascular: Positive for palpitations (More just fast heart rates). Negative for claudication.  Gastrointestinal: Negative for blood in stool and melena.  Genitourinary: Negative for hematuria.  Neurological: Positive for dizziness, tingling and tremors.       These are symptoms noted with SVT otherwise not present  Psychiatric/Behavioral: Negative for depression (Surprisingly is in pretty good spirits) and memory loss. The patient is nervous/anxious and has insomnia (Starting to sleep a little better now.).    I have reviewed and (if needed) personally updated the patient's problem list, medications, allergies, past medical and surgical  history, social and family history.   PAST MEDICAL HISTORY   Past Medical History:  Diagnosis Date  . CAD S/P percutaneous coronary angioplasty 2004   which showed essentially normal LV size and function, moderately dilated left atrium, moderate mitral prolapse with mild to moderate regurgitation.  . Dyslipidemia, goal LDL below 70   . Essential hypertension   . GERD (gastroesophageal reflux disease)   . History of Mitral valve prolapse    Moderate - with moderate MR, noted February t 2013  . History of Severe mitral regurgitation by prior echocardiogram 02/06/2014   TEE: Severe mitral regurgitation with a flail P2 segment and ruptured; Normal LV size & function, dilated LA.  Marland Kitchen History of stress test 12/2009   he walked 9 mins reaching 10 METS. There was an attenuation artifact in the inferior region but no ischemia or infaract, low risk.  . Incidental lung nodule,  greater than or equal to 54mm 03/16/2014   Ground glass opacity RML noted on CT scan  . lung ca dx'd 03/2019  . PONV (postoperative nausea and vomiting)   . 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 ST elevation myocardial infarction (STEMI) of anterior wall, subsequent episode of care [facility]) 2004   he had a proximal LAD occlusion treated with 2 overlapping 3.5 x 1.8 mm Cypher DES stents.     PAST SURGICAL HISTORY   Past Surgical History:  Procedure Laterality Date  . ANTERIOR CRUCIATE LIGAMENT REPAIR Left 1993  . CARDIAC CATHETERIZATION  2004   he had a proximal LAD occlusion treated with 2 overlapping 3.5 x 1.8 mm Cypher DES stents.  Marland Kitchen CARDIAC CATHETERIZATION  2007; 03/2014   Widely patent LAD stents, 40% distal LAD, 30% Cx, ~50% PL-PDA bifurcation  lesion   . COLONOSCOPY    . INTRAOPERATIVE TRANSESOPHAGEAL ECHOCARDIOGRAM N/A 04/08/2014   Procedure: INTRAOPERATIVE TRANSESOPHAGEAL ECHOCARDIOGRAM;  Surgeon: Rexene Alberts, MD;  Location: [place];  Service: Open Heart Surgery;  Laterality: N/A;  . LEFT AND RIGHT HEART CATHETERIZATION WITH CORONARY ANGIOGRAM N/A 03/05/2014   Procedure: LEFT AND RIGHT HEART CATHETERIZATION WITH CORONARY ANGIOGRAM;  Surgeon: Leonie Man, MD;  [facility];  Service: Cardiovascular;  Laterality: N/A;  . LEFT HEART CATHETERIZATION WITH CORONARY ANGIOGRAM N/A 09/15/2011   Procedure: LEFT HEART CATHETERIZATION WITH CORONARY ANGIOGRAM;  Surgeon: Lorretta Harp, MD;  Location: [facility] CATH LAB;  Service: Cardiovascular;  Laterality: N/A;  . MITRAL VALVE REPAIR Right 04/08/2014   Procedure: MINIMALLY INVASIVE MITRAL VALVE REPAIR (MVR);  Surgeon: Rexene Alberts, MD;  Location: [place];  Service: Open Heart Surgery;  Laterality: Right;  . NM MYOVIEW LTD  May 2011   Walk 9 minutes, and 10 METs, diaphragmatic attenuation but no ischemia or infarction.  . TEE WITHOUT CARDIOVERSION N/A 02/06/2014   Procedure: TRANSESOPHAGEAL ECHOCARDIOGRAM (TEE);  Surgeon: Pixie Casino, MD;  Normal LV Size U& function - EF 55-60%, no regional WMA.  MV P2 Leaflet is flail with ruptured chord with severe prolapse, anterior leaflet intact.  Severe, eccentric anterior directed MR with dilated LA.  Marland Kitchen TRANSTHORACIC ECHOCARDIOGRAM  07/18/2018   Normal LV size and function.  EF 50-60 %.  Unable to assess diastolic function.  Mitral valve sewing ring in place.  Mild stenosis with a gradient of 6 mmHg noted. -  Domingo Dimes ECHOCARDIOGRAM  June 30 2015   Low normal LV function with EF 50-55%. GR 1 DD. No MR. Normal gradient of 4 mmHg the mitral valve. No prolapse.  Marland Kitchen VIDEO BRONCHOSCOPY WITH ENDOBRONCHIAL ULTRASOUND N/A 05/16/2019   Procedure: VIDEO BRONCHOSCOPY WITH ENDOBRONCHIAL ULTRASOUND;  Surgeon: Marshell Garfinkel, MD;  Location: [place];   Service: Pulmonary;  Laterality: N/A;     MEDICATIONS/ALLERGIES   Current Meds  Medication Sig  . Ascorbic Acid (VITAMIN C) 1000 MG tablet Take 2,000 mg by mouth daily.   . Coenzyme Q10 (CO Q 10) 100 MG CAPS Take 100 mg by mouth daily.   . metoprolol tartrate (LOPRESSOR) 25 MG tablet Take 1 tablet (25 mg total) by mouth 2 (two) times daily.  . predniSONE (DELTASONE) 20 MG tablet 4 tablet p.o. daily for 4 days, then 3 tablet p.o. daily  for 4 days then 2 tablets p.o. daily for 4 days then 1 tablet p.o. daily for 4 days then half a tablet p.o. q. days for 7 days.  . Probiotic Product (PROBIOTIC DAILY PO) Take 1 capsule by mouth daily.   . prochlorperazine (COMPAZINE) 10 MG tablet Take 1 tablet (10 mg total) by mouth every 6 (six) hours as needed for nausea or vomiting.  Marland Kitchen Specialty Vitamins Products (PROSTATE PO) Take 1 capsule by mouth daily. PROSTATE FORMULA  . sucralfate (CARAFATE) 1 g tablet Take 1 tablet (1 g total) by mouth 4 (four) times daily -  with meals and at bedtime. crush and dissolve in 10 cc of water prior to consuming  . Vitamin D-Vitamin K (D3 + K2 DOTS PO) Take 1 tablet by mouth daily.  Marland Kitchen zolpidem (AMBIEN) 5 MG tablet TAKE 1-2 TABLET BY MOUTH EVERY NIGHT AT BEDTIME AS NEEDED FOR SLEEP    Allergies  Allergen Reactions  . A-Cillin [Ampicillin] Shortness Of Breath, Swelling and Rash    Did it involve swelling of the face/tongue/throat, SOB, or low BP? Yes Did it involve sudden or severe rash/hives, skin peeling, or any reaction on the inside of your mouth or nose? Yes Did you need to seek medical attention at a hospital or doctor's office? Yes When did it last happen?~20 years ago If all above answers are "NO", may proceed with cephalosporin use.    Marland Kitchen Amoxicillin Hives, Shortness Of Breath and Swelling    Did it involve swelling of the face/tongue/throat, SOB, or low BP? Yes Did it involve sudden or severe rash/hives, skin peeling, or any reaction on the inside of your  mouth or nose? Yes Did you need to seek medical attention at a hospital or doctor's office? Yes When did it last happen?~20 years ago If all above answers are "NO", may proceed with cephalosporin use.   . Other Hives, Shortness Of Breath and Swelling    ALL "CILLINS"  . Penicillins Hives, Shortness Of Breath and Swelling    Did it involve swelling of the face/tongue/throat, SOB, or low BP? Yes Did it involve sudden or severe rash/hives, skin peeling, or any reaction on the inside of your mouth or nose? Yes Did you need to seek medical attention at a hospital or doctor's office? Yes When did it last happen?~20 years ago If all above answers are "NO", may proceed with cephalosporin use.    . Sulfa Antibiotics Nausea Only     SOCIAL HISTORY/FAMILY HISTORY   Social History   Tobacco Use  . Smoking status: Never Smoker  . Smokeless tobacco: Never Used  Substance Use Topics  . Alcohol use: Yes    Alcohol/week: 10.0 standard drinks    Types: 5 Cans of beer, 5 Shots of liquor per week    Comment: social  . Drug use: No   Social History   Social History Narrative   He is a married father of one. Exercises avidly as noted above - runs routinely at least 3 miles 3-4 times a day.  He drinks his Xango fruit juce - 32 Oz. Daily.     Never smoked and only takes occasional alcohol    Family History family history includes Cancer in his paternal grandmother; Heart Problems in his father; Hypertension in his mother.   OBJCTIVE -PE, EKG, labs   Wt Readings from Last 3 Encounters:  09/10/19 177 lb 11.2 oz (80.6 kg)  09/09/19 176 lb 9.6 oz (80.1 kg)  09/06/19 170 lb (  77.1 kg)    Physical Exam: BP 110/72   Pulse 96   Temp (!) 97.1 F (36.2 C)   Ht 6' (1.829 m)   Wt 176 lb 9.6 oz (80.1 kg)   SpO2 99%   BMI 23.95 kg/m  Physical Exam  Constitutional: He is oriented to person, place, and time. He appears well-nourished. No distress.  More thin than usual, but not yet truly  frail appearing.  Notably lost weight from last visit here.  HENT:  Head: Normocephalic and atraumatic.  Neck: No hepatojugular reflux and no JVD present. Carotid bruit is not present. No tracheal deviation present.  Cardiovascular: Normal rate, regular rhythm and intact distal pulses.  Occasional extrasystoles are present. PMI is not displaced. Exam reveals no gallop and no friction rub.  Murmur (Very soft SEM at RUSB.  No radiation.) heard. Pulmonary/Chest: Effort normal and breath sounds normal. No respiratory distress. He has no wheezes. He has no rales.  Abdominal: Soft. Bowel sounds are normal. He exhibits no distension. There is no abdominal tenderness. There is no rebound.  Musculoskeletal:        General: No edema. Normal range of motion.     Cervical back: Normal range of motion and neck supple.  Neurological: He is alert and oriented to person, place, and time.  Psychiatric: He has a normal mood and affect. His behavior is normal. Judgment and thought content normal.  Vitals reviewed.   Adult ECG Report  Rate: 96 ;  Rhythm: normal sinus rhythm and Bilateral atrial enlargement, but otherwise normal;   Narrative Interpretation: Relatively stable  Recent Labs:    No results found for: CHOL, HDL, LDLCALC, LDLDIRECT, TRIG, CHOLHDL -> has not had lipids checked in a while. Last checked October 2019: TC 109, TG 94, HDL 49, LDL 131 Lab Results  Component Value Date   CREATININE 0.79 09/06/2019   BUN 16 09/06/2019   NA 138 09/06/2019   K 4.0 09/06/2019   CL 104 09/06/2019   CO2 23 09/06/2019    ASSESSMENT/PLAN    Problem List Items Addressed This Visit    CAD S/P percutaneous coronary angioplasty - DES PCI to occluded LAD during anterior STEMI (Chronic)    Again, remains totally asymptomatic.  He is also very reluctant to take medications.  At this point I think we can take it easy on evaluation.  He is on low-dose metoprolol which he agrees to take because of SVT.  No active  angina.  Not currently taking aspirin although will be willing to consider it in the future.  Not on statin and is reluctant to even consider it.      Relevant Orders   EKG 12-Lead   Lipid panel   Comprehensive metabolic panel   History of Severe Mitral valve prolapse - severe prolapse of the posterior (P2) leaflet with severe MR (Chronic)    Neck echo shows relatively well-seated mitral valve.  Left atrial size is still normal.  Murmur seems to be pretty well controlled.  No active heart failure symptoms.      S/P minimally invasive mitral valve repair (Chronic)   Relevant Orders   EKG 12-Lead   Essential hypertension (Chronic)    As usual, his blood pressure is not high.  He is on low-dose beta-blocker and tolerating it well.  In fact I am more worried about hypotension than hypertension.      Dyslipidemia, goal LDL below 70 (Chronic)    Still very poorly controlled LDL.  He  just has not been interested in it at all in treatment.  The plan when I last saw him was for him to see [facility] in the spring after lab check.  Unfortunately this arrived on the time of his diagnosis of lung cancer and that never happened.    Plan: We will allow him to recover to the least the summertime before recheck lipid panel.  At that time if he is not at goal, we will definitely want to discuss treatment options perhaps even potentially considering inclisiran or PCSK9 inhibitors.      Relevant Orders   Lipid panel   Comprehensive metabolic panel   Adenocarcinoma of right lung, stage 3 (HCC) (Chronic)   SVT (supraventricular tachycardia) (HCC) - Primary (Chronic)    I do suspect that his SVT is probably related to chemotherapy.  What is concerning though is that his EF has definitely gone down some.  Question if this is could be related to tachycardia induced cardiomyopathy or simply related to chemotherapy.  Difficult situation that he is feeling better with twice daily dosing of metoprolol, with reduced  EF should probably be be on Toprol.  Eventually would be nice to switch back to Toprol once he does not show any signs of recurrence of SVT.  We discussed vagal maneuvers but also the thought of simply being treated via EMS or in the ER.  If he does not wait too long, we should get a get converted.  With him having coronary disease and reduced EF, I am limited on options for PRN pill in pocket options for breakthrough other than amiodarone.  I would not use amiodarone with him for SVT.  If we are able to capture SVT or if there is another episode, may consider EP evaluation and consider SVT ablation.      Relevant Orders   EKG 12-Lead       COVID-19 Education: The signs and symptoms of COVID-19 were discussed with the patient and how to seek care for testing (follow up with PCP or arrange E-visit).   The importance of social distancing was discussed today.  I spent a total of 24minutes with the patient. >  50% of the time was spent in direct patient consultation.  Additional time spent with chart review  / charting (studies, outside notes, etc): 10 Total Time: 32 min   Current medicines are reviewed at length with the patient today.  (+/- concerns) n/a   Patient Instructions / Medication Changes & Studies & Tests Ordered   Patient Instructions  Medication Instructions:  No changes   may take an extra metoprolol if you have an episode of fast heart rate if last longer than 30 min after taking the extra dose  or symptoms become worse then go  ER best to call 911   *If you need a refill on your cardiac medications before your next appointment, please call your pharmacy*  Lab Work: LIPID CMP in 6 month prior to appointment - fasting  If you have labs (blood work) drawn today and your tests are completely normal, you will receive your results only by: Marland Kitchen MyChart Message (if you have MyChart) OR . A paper copy in the mail If you have any lab test that is abnormal or we need to  change your treatment, we will call you to review the results.  Testing/Procedures: Not needed  Follow-Up: At [facility], you and your health needs are our priority.  As part of our continuing mission to  provide you with exceptional heart care, we have created designated Provider Care Teams.  These Care Teams include your primary Cardiologist (physician) and Advanced Practice Providers (APPs -  Physician Assistants and Nurse Practitioners) who all work together to provide you with the care you need, when you need it.  Your next appointment:   6 month(s)  The format for your next appointment:   In Person  Provider:   Cherlynn Kaiser, MD  Other Instructions Recommendations for vagal maneuvers:  "Bearing down"  Coughing  Gagging  Icy, cold towel on face or drink ice cold water     Studies Ordered:   Orders Placed This Encounter  Procedures  . Lipid panel  . Comprehensive metabolic panel  . EKG 12-Lead     Glenetta Hew, M.D., M.S. Interventional Cardiologist   Pager # [phone] Phone # [phone] [address] [place], [place] [address]   Thank you for choosing [facility] at [facility]!!

## 2019-09-10 ENCOUNTER — Telehealth: Payer: Self-pay | Admitting: Emergency Medicine

## 2019-09-10 ENCOUNTER — Other Ambulatory Visit: Payer: Self-pay

## 2019-09-10 ENCOUNTER — Inpatient Hospital Stay: Payer: Federal, State, Local not specified - PPO

## 2019-09-10 ENCOUNTER — Inpatient Hospital Stay (HOSPITAL_BASED_OUTPATIENT_CLINIC_OR_DEPARTMENT_OTHER): Payer: Federal, State, Local not specified - PPO | Admitting: Medical

## 2019-09-10 VITALS — BP 112/81 | HR 96 | Temp 98.7°F | Resp 18 | Ht 72.0 in | Wt 177.7 lb

## 2019-09-10 DIAGNOSIS — L0291 Cutaneous abscess, unspecified: Secondary | ICD-10-CM

## 2019-09-10 DIAGNOSIS — C3491 Malignant neoplasm of unspecified part of right bronchus or lung: Secondary | ICD-10-CM

## 2019-09-10 DIAGNOSIS — C3431 Malignant neoplasm of lower lobe, right bronchus or lung: Secondary | ICD-10-CM | POA: Diagnosis not present

## 2019-09-10 MED ORDER — DOXYCYCLINE HYCLATE 100 MG PO TBEC: 100.0000 mg | DELAYED_RELEASE_TABLET | Freq: Two times a day (BID) | ORAL | 0 refills | Status: DC

## 2019-09-10 MED ORDER — MUPIROCIN 2 % EX OINT: TOPICAL_OINTMENT | CUTANEOUS | 1 refills | Status: DC

## 2019-09-10 NOTE — Patient Instructions (Signed)
Wound Care, Adult Taking care of your wound properly can help to prevent pain, infection, and scarring. It can also help your wound to heal more quickly. How to care for your wound Wound care      Follow instructions from your health care provider about how to take care of your wound. Make sure you: ? Wash your hands with soap and water before you change the bandage (dressing). If soap and water are not available, use hand sanitizer. ? Change your dressing as told by your health care provider. ? Leave stitches (sutures), skin glue, or adhesive strips in place. These skin closures may need to stay in place for 2 weeks or longer. If adhesive strip edges start to loosen and curl up, you may trim the loose edges. Do not remove adhesive strips completely unless your health care provider tells you to do that.  Check your wound area every day for signs of infection. Check for: ? Redness, swelling, or pain. ? Fluid or blood. ? Warmth. ? Pus or a bad smell.  Ask your health care provider if you should clean the wound with mild soap and water. Doing this may include: ? Using a clean towel to pat the wound dry after cleaning it. Do not rub or scrub the wound. ? Applying a cream or ointment. Do this only as told by your health care provider. ? Covering the incision with a clean dressing.  Ask your health care provider when you can leave the wound uncovered.  Keep the dressing dry until your health care provider says it can be removed. Do not take baths, swim, use a hot tub, or do anything that would put the wound underwater until your health care provider approves. Ask your health care provider if you can take showers. You may only be allowed to take sponge baths. Medicines   If you were prescribed an antibiotic medicine, cream, or ointment, take or use the antibiotic as told by your health care provider. Do not stop taking or using the antibiotic even if your condition improves.  Take  over-the-counter and prescription medicines only as told by your health care provider. If you were prescribed pain medicine, take it 30 or more minutes before you do any wound care or as told by your health care provider. General instructions  Return to your normal activities as told by your health care provider. Ask your health care provider what activities are safe.  Do not scratch or pick at the wound.  Do not use any products that contain nicotine or tobacco, such as cigarettes and e-cigarettes. These may delay wound healing. If you need help quitting, ask your health care provider.  Keep all follow-up visits as told by your health care provider. This is important.  Eat a diet that includes protein, vitamin A, vitamin C, and other nutrient-rich foods to help the wound heal. ? Foods rich in protein include meat, dairy, beans, nuts, and other sources. ? Foods rich in vitamin A include carrots and dark green, leafy vegetables. ? Foods rich in vitamin C include citrus, tomatoes, and other fruits and vegetables. ? Nutrient-rich foods have protein, carbohydrates, fat, vitamins, or minerals. Eat a variety of healthy foods including vegetables, fruits, and whole grains. Contact a health care provider if:  You received a tetanus shot and you have swelling, severe pain, redness, or bleeding at the injection site.  Your pain is not controlled with medicine.  You have redness, swelling, or pain around the wound.    You have fluid or blood coming from the wound.  Your wound feels warm to the touch.  You have pus or a bad smell coming from the wound.  You have a fever or chills.  You are nauseous or you vomit.  You are dizzy. Get help right away if:  You have a red streak going away from your wound.  The edges of the wound open up and separate.  Your wound is bleeding, and the bleeding does not stop with gentle pressure.  You have a rash.  You faint.  You have trouble  breathing. Summary  Always wash your hands with soap and water before changing your bandage (dressing).  To help with healing, eat foods that are rich in protein, vitamin A, vitamin C, and other nutrients.  Check your wound every day for signs of infection. Contact your health care provider if you suspect that your wound is infected. This information is not intended to replace advice given to you by your health care provider. Make sure you discuss any questions you have with your health care provider. Document Revised: 11/18/2018 Document Reviewed: 02/15/2016 Elsevier Patient Education  2020 [employer]vier [employer].  

## 2019-09-10 NOTE — Telephone Encounter (Signed)
Called to confirm [facility] appt today at 2pm for rash/leg wound with pt (states he was not aware).  Pt agreed to appt time today.  High priority scheduling message sent.

## 2019-09-10 NOTE — Progress Notes (Signed)
Pt presents with red raised tender spot on upper outer Left leg/hip x1 week.  Swelling and tenderness and redness at site.  Afebrile.  Denies injury or recent injection in that area.  Wound opened and drained by PA Lucianne Lei, wound culture sent to lab.  Site cleaned and dressed, bleeding controlled.  Pt provided with gauze for extra absorption and has large bandaids at home to use on site after applying topical abx cream.  Pt aware to f/u as needed if site worsens or with any questions/concerns.  Teachback method used and pt verbalizes understanding of how to clean & dress wound.

## 2019-09-11 ENCOUNTER — Encounter: Payer: Self-pay | Admitting: Cardiology

## 2019-09-11 NOTE — Assessment & Plan Note (Signed)
As usual, his blood pressure is not high.  He is on low-dose beta-blocker and tolerating it well.  In fact I am more worried about hypotension than hypertension.

## 2019-09-11 NOTE — Progress Notes (Signed)
[facility] Progress Note   Alan [name] [id number] 1956-03-01 64 y.o.  Alan [name] is managed by Dr. Fanny Bien. Alan [name]  Actively treated with chemotherapy/immunotherapy/hormonal therapy: yes  Current therapy: Imfinzi  Last treated: 09/04/2019 (cycle #1)  Next scheduled appointment with provider: 09/18/2019  Assessment: Plan:    Abscess - Plan: mupirocin ointment (BACTROBAN) 2 %, doxycycline (DORYX) 100 MG EC tablet, Culture, Aerobic, CANCELED: Culture, Aerobic  Adenocarcinoma of right lung, stage 3 (HCC)   Abscess of the right buttock: A culture was obtained. The patient was given a prescription for Bactroban and doxycycline. He was told use sitz baths or place warm compresses to the area to help this abscess drain.  Stage 3 adenocarcinoma of the right lung: Mr. Loescher continues to be followed by Dr. Julien Nordmann and is status post cycle # 1 of Imfinzi which was dosed on 09/04/2019. He is to return to be seen next on 09/18/2019.   Please see After Visit Summary for patient specific instructions.  Future Appointments  Date Time Provider [place]  09/18/2019  2:00 PM Heilingoetter, Cassandra L, PA-C [facility] None  10/01/2019  8:00 AM [facility] LAB 4 [facility] None  10/01/2019  8:30 AM Curt Bears, MD [facility] None  10/01/2019  9:30 AM [facility] INFUSION [facility] None  10/29/2019  9:00 AM [facility] LAB 5 [facility] None  10/29/2019  9:30 AM Heilingoetter, Cassandra L, PA-C [facility] None  10/29/2019 10:30 AM [facility] INFUSION [facility] None  11/27/2019  9:00 AM [facility] LAB 2 [facility] None  11/27/2019  9:30 AM Curt Bears, MD [facility] None  11/27/2019 10:30 AM [facility] INFUSION [facility] None    Orders Placed This Encounter  Procedures  . Culture, Aerobic  . Aerobic Culture (superficial specimen)       Subjective:   Patient ID:  Alan [name] is a 64 y.o. (DOB 1956/06/21) male.  Chief  Complaint:  Chief Complaint  Patient presents with  . Wound/Bite    Left Leg/Hip    HPI Alan [name] Is a 64 y.o. male with a diagnosis of a stage 3 adenocarcinoma of the right lung. He is managed by Dr. Julien Nordmann and is status post cycle # 1 of Imfinzi which was dosed on 09/04/2019. He presents today with a swollen, tender area in his right mid-buttock. He questions if he had an insect bite. He denies an injury, change in activity, drainage, fevers, chills, or sweats.  Medications: I have reviewed the patient's current medications.  Allergies:  Allergies  Allergen Reactions  . A-Cillin [Ampicillin] Shortness Of Breath, Swelling and Rash    Did it involve swelling of the face/tongue/throat, SOB, or low BP? Yes Did it involve sudden or severe rash/hives, skin peeling, or any reaction on the inside of your mouth or nose? Yes Did you need to seek medical attention at a hospital or doctor's office? Yes When did it last happen?~20 years ago If all above answers are "NO", may proceed with cephalosporin use.    Marland Kitchen Amoxicillin Hives, Shortness Of Breath and Swelling    Did it involve swelling of the face/tongue/throat, SOB, or low BP? Yes Did it involve sudden or severe rash/hives, skin peeling, or any reaction on the inside of your mouth or nose? Yes Did you need to seek medical attention at a hospital or doctor's office? Yes When did it last happen?~20 years ago If all above answers are "NO", may proceed with cephalosporin use.   . Other Hives, Shortness Of Breath and Swelling  ALL "CILLINS"  . Penicillins Hives, Shortness Of Breath and Swelling    Did it involve swelling of the face/tongue/throat, SOB, or low BP? Yes Did it involve sudden or severe rash/hives, skin peeling, or any reaction on the inside of your mouth or nose? Yes Did you need to seek medical attention at a hospital or doctor's office? Yes When did it last happen?~20 years ago If all above answers are "NO", may  proceed with cephalosporin use.    . Sulfa Antibiotics Nausea Only    Past Medical History:  Diagnosis Date  . CAD S/P percutaneous coronary angioplasty 2004   which showed essentially normal LV size and function, moderately dilated left atrium, moderate mitral prolapse with mild to moderate regurgitation.  . Dyslipidemia, goal LDL below 70   . Essential hypertension   . GERD (gastroesophageal reflux disease)   . History of Mitral valve prolapse    Moderate - with moderate MR, noted February t 2013  . History of Severe mitral regurgitation by prior echocardiogram 02/06/2014   TEE: Severe mitral regurgitation with a flail P2 segment and ruptured; Normal LV size & function, dilated LA.  Marland Kitchen History of stress test 12/2009   he walked 9 mins reaching 10 METS. There was an attenuation artifact in the inferior region but no ischemia or infaract, low risk.  . Incidental lung nodule, greater than or equal to 79mm 03/16/2014   Ground glass opacity RML noted on CT scan  . lung ca dx'd 03/2019  . PONV (postoperative nausea and vomiting)   . 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 ST elevation myocardial infarction (STEMI) of anterior wall, subsequent episode of care [facility]) 2004   he had a proximal LAD occlusion treated with 2 overlapping 3.5 x 1.8 mm Cypher DES stents.    Past Surgical History:  Procedure Laterality Date  . ANTERIOR CRUCIATE LIGAMENT REPAIR Left 1993  . CARDIAC CATHETERIZATION  2004   he had a proximal LAD occlusion treated with 2 overlapping 3.5 x 1.8 mm Cypher DES stents.  Marland Kitchen CARDIAC CATHETERIZATION  2007; 03/2014   Widely patent LAD stents, 40% distal LAD, 30% Cx, ~50% PL-PDA bifurcation lesion   .  COLONOSCOPY    . INTRAOPERATIVE TRANSESOPHAGEAL ECHOCARDIOGRAM N/A 04/08/2014   Procedure: INTRAOPERATIVE TRANSESOPHAGEAL ECHOCARDIOGRAM;  Surgeon: Rexene Alberts, MD;  Location: [place];  Service: Open Heart Surgery;  Laterality: N/A;  . LEFT AND RIGHT HEART CATHETERIZATION WITH CORONARY ANGIOGRAM N/A 03/05/2014   Procedure: LEFT AND RIGHT HEART CATHETERIZATION WITH CORONARY ANGIOGRAM;  Surgeon: Leonie Man, MD;  Location: [facility] CATH LAB;  Service: Cardiovascular;  Laterality: N/A;  . LEFT HEART CATHETERIZATION WITH CORONARY ANGIOGRAM N/A 09/15/2011   Procedure: LEFT HEART CATHETERIZATION WITH CORONARY ANGIOGRAM;  Surgeon: Lorretta Harp, MD;  Location: [facility] CATH LAB;  Service: Cardiovascular;  Laterality: N/A;  . MITRAL VALVE REPAIR Right 04/08/2014   Procedure: MINIMALLY INVASIVE MITRAL VALVE REPAIR (MVR);  Surgeon: Rexene Alberts, MD;  Location: [place];  Service: Open Heart Surgery;  Laterality: Right;  . NM MYOVIEW LTD  May 2011   Walk 9 minutes, and 10 METs, diaphragmatic attenuation but no ischemia or infarction.  . TEE WITHOUT CARDIOVERSION N/A 02/06/2014   Procedure: TRANSESOPHAGEAL ECHOCARDIOGRAM (TEE);  Surgeon: Nadean Corwin.  Hilty, MD;  Normal LV Size U& function - EF 55-60%, no regional WMA.  MV P2 Leaflet is flail with ruptured chord with severe prolapse, anterior leaflet intact.  Severe, eccentric anterior directed MR with dilated LA.  Marland Kitchen TRANSTHORACIC ECHOCARDIOGRAM  07/18/2018   Normal LV size and function.  EF 50-60 %.  Unable to assess diastolic function.  Mitral valve sewing ring in place.  Mild stenosis with a gradient of 6 mmHg noted. -  Domingo Dimes ECHOCARDIOGRAM  June 30 2015   Low normal LV function with EF 50-55%. GR 1 DD. No MR. Normal gradient of 4 mmHg the mitral valve. No prolapse.  Marland Kitchen VIDEO BRONCHOSCOPY WITH ENDOBRONCHIAL ULTRASOUND N/A 05/16/2019   Procedure: VIDEO BRONCHOSCOPY WITH ENDOBRONCHIAL ULTRASOUND;  Surgeon: Marshell Garfinkel, MD;  Location: [place];  Service:  Pulmonary;  Laterality: N/A;    Family History  Problem Relation Age of Onset  . Hypertension Mother   . Heart Problems Father        triple bypass 1989  . Cancer Paternal Grandmother        Pancreatic    Social History   Socioeconomic History  . Marital status: Married    Spouse name: Not on file  . Number of children: Not on file  . Years of education: Not on file  . Highest education level: Not on file  Occupational History  . Not on file  Tobacco Use  . Smoking status: Never Smoker  . Smokeless tobacco: Never Used  Substance and Sexual Activity  . Alcohol use: Yes    Alcohol/week: 10.0 standard drinks    Types: 5 Cans of beer, 5 Shots of liquor per week    Comment: social  . Drug use: No  . Sexual activity: Not on file  Other Topics Concern  . Not on file  Social History Narrative   He is a married father of one. Exercises avidly as noted above - runs routinely at least 3 miles 3-4 times a day.  He drinks his Xango fruit juce - 32 Oz. Daily.     Never smoked and only takes occasional alcohol   Social Determinants of Health   Financial Resource Strain:   . Difficulty of Paying Living Expenses: Not on file  Food Insecurity:   . Worried About Charity fundraiser in the Last Year: Not on file  . Ran Out of Food in the Last Year: Not on file  Transportation Needs:   . Lack of Transportation (Medical): Not on file  . Lack of Transportation (Non-Medical): Not on file  Physical Activity:   . Days of Exercise per Week: Not on file  . Minutes of Exercise per Session: Not on file  Stress:   . Feeling of Stress : Not on file  Social Connections:   . Frequency of Communication with Friends and Family: Not on file  . Frequency of Social Gatherings with Friends and Family: Not on file  . Attends Religious Services: Not on file  . Active Member of Clubs or Organizations: Not on file  . Attends Archivist Meetings: Not on file  . Marital Status: Not on file    Intimate Partner Violence:   . Fear of Current or Ex-Partner: Not on file  . Emotionally Abused: Not on file  . Physically Abused: Not on file  . Sexually Abused: Not on file    Past Medical History, Surgical history, Social history, and Family history were reviewed and updated as appropriate.  Please see review of systems for further details on the patient's review from today.   Review of Systems:  Review of Systems  Constitutional: Negative for chills, diaphoresis and fever.  HENT: Negative for facial swelling and trouble swallowing.   Respiratory: Negative for cough, chest tightness and shortness of breath.   Cardiovascular: Negative for chest pain.  Skin:       swollen, tender area in his right mid-buttock    Objective:   Physical Exam:  BP 112/81 (BP Location: Left Arm, Patient Position: Sitting)   Pulse 96   Temp 98.7 F (37.1 C) (Temporal)   Resp 18   Ht 6' (1.829 m)   Wt 177 lb 11.2 oz (80.6 kg)   SpO2 100%   BMI 24.10 kg/m  ECOG: 0  Physical Exam Constitutional:      General: He is not in acute distress.    Appearance: Normal appearance. He is not ill-appearing.  HENT:     Head: Normocephalic and atraumatic.  Eyes:     General: No scleral icterus.       Right eye: No discharge.        Left eye: No discharge.  Skin:    General: Skin is warm and dry.     Findings: Lesion present.     Comments: A raised pustule was noted in the right mid-buttock. This was opened with a sterile needle using aseptic technique and produced purulent material that was collected for culture.  Neurological:     Mental Status: He is alert.     Coordination: Coordination normal.     Gait: Gait normal.  Psychiatric:        Mood and Affect: Mood normal.        Behavior: Behavior normal.        Thought Content: Thought content normal.        Judgment: Judgment normal.     Lab Review:     Component Value Date/Time   NA 138 09/06/2019 1130   K 4.0 09/06/2019 1130   CL 104  09/06/2019 1130   CO2 23 09/06/2019 1130   GLUCOSE 113 (H) 09/06/2019 1130   BUN 16 09/06/2019 1130   CREATININE 0.79 09/06/2019 1130   CREATININE 0.77 09/04/2019 1426   CREATININE 0.80 02/27/2014 1152   CALCIUM 8.8 (L) 09/06/2019 1130   PROT 6.1 (L) 09/06/2019 1130   ALBUMIN 3.2 (L) 09/06/2019 1130   AST 30 09/06/2019 1130   AST 12 (L) 09/04/2019 1426   ALT 25 09/06/2019 1130   ALT 14 09/04/2019 1426   ALKPHOS 75 09/06/2019 1130   BILITOT 0.9 09/06/2019 1130   BILITOT 0.5 09/04/2019 1426   GFRNONAA >60 09/06/2019 1130   GFRNONAA >60 09/04/2019 1426   GFRAA >60 09/06/2019 1130   GFRAA >60 09/04/2019 1426       Component Value Date/Time   WBC 6.4 09/06/2019 1130   RBC 3.58 (L) 09/06/2019 1130   HGB 11.8 (L) 09/06/2019 1130   HGB 11.8 (L) 09/04/2019 1426   HCT 37.1 (L) 09/06/2019 1130   PLT 138 (L) 09/06/2019 1130   PLT 148 (L) 09/04/2019 1426   MCV 103.6 (H) 09/06/2019 1130   MCH 33.0 09/06/2019 1130   MCHC 31.8 09/06/2019 1130   RDW 14.6 09/06/2019 1130   LYMPHSABS 0.1 (L) 09/06/2019 1130   MONOABS 0.5 09/06/2019 1130   EOSABS 0.3 09/06/2019 1130   BASOSABS 0.0 09/06/2019 1130   -------------------------------  Imaging from last 24 hours (if applicable):  Radiology interpretation: ECHOCARDIOGRAM COMPLETE  Result Date: 09/02/2019   ECHOCARDIOGRAM REPORT   Patient Name:   KALUM MINNER Date of Exam: 09/02/2019 Medical Rec #:  [id number]        Height:       76.0 in Accession #:    [id number]       Weight:       176.4 lb Date of Birth:  09-25-1955         BSA:          2.10 m Patient Age:    72 years         BP:           109/73 mmHg Patient Gender: M                HR:           84 bpm. Exam Location:  [place] Procedure: 2D Echo, 3D Echo, Cardiac Doppler, Color Doppler and Strain Analysis Indications:    I25.1 CAD  History:        Patient has prior history of Echocardiogram examinations, most                 recent 07/18/2018. CAD and Previous Myocardial Infarction,  Mitral                 Valve Prolapse, Arrythmias:SVT; Risk Factors:Hypertension and                 Dyslipidemia. Mitral Valve: annuloplasty ring repair valve is                 present in the mitral position. Mitral valve repair.  Sonographer:    [place] Referring Phys: [place]  1. Left ventricular ejection fraction, by visual estimation, is 40 to 45%. The left ventricle has mildly decreased function. There is no left ventricular hypertrophy.  2. Left ventricular diastolic parameters are consistent with Grade I diastolic dysfunction (impaired relaxation).  3. The left ventricle has no regional wall motion abnormalities.  4. Global right ventricle has normal systolic function.The right ventricular size is normal. No increase in right ventricular wall thickness.  5. Left atrial size was normal.  6. Right atrial size was normal.  7. The mitral valve has been repaired/replaced. Trivial mitral valve regurgitation.  8. The tricuspid valve is grossly normal.  9. The tricuspid valve is grossly normal. Tricuspid valve regurgitation is trivial. 10. The aortic valve is tricuspid. Aortic valve regurgitation is not visualized. 11. The pulmonic valve was grossly normal. Pulmonic valve regurgitation is not visualized. 12. Aortic dilatation noted. 13. 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 14. The atrial septum is grossly normal. 15. The average left ventricular global longitudinal strain is -20.2 %. In comparison to the previous echocardiogram(s): 07/18/18 EF 50-55%. MV 65mmHg. FINDINGS  Left Ventricle: Left ventricular ejection fraction, by visual estimation, is 40 to 45%. The left ventricle has mildly decreased function. The average left ventricular global longitudinal strain is -20.2 %. The left ventricle has no regional wall motion abnormalities. There is no left ventricular hypertrophy. Left ventricular diastolic parameters are consistent with Grade  I diastolic dysfunction (impaired relaxation). Right Ventricle: The right ventricular size is normal. No increase in right ventricular wall thickness. Global RV systolic function is has normal systolic function. Left Atrium: Left atrial size was normal in size. Right Atrium: Right atrial size was normal in size Pericardium: There is  no evidence of pericardial effusion. Mitral Valve: The mitral valve has been repaired/replaced. Trivial mitral valve regurgitation. MV peak gradient, 11.8 mmHg. Tricuspid Valve: The tricuspid valve is grossly normal. Tricuspid valve regurgitation is trivial. Aortic Valve: The aortic valve is tricuspid. Aortic valve regurgitation is not visualized. Pulmonic Valve: The pulmonic valve was grossly normal. Pulmonic valve regurgitation is not visualized. Pulmonic regurgitation is not visualized. Aorta: Aortic dilatation noted. 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 IAS/Shunts: The atrial septum is grossly normal.  LEFT VENTRICLE PLAX 2D LVIDd:         4.20 cm  Diastology LVIDs:         3.15 cm  LV e' lateral:   6.83 cm/s LV PW:         1.00 cm  LV E/e' lateral: 14.1 LV IVS:        0.90 cm  LV e' medial:    5.03 cm/s LVOT diam:     2.20 cm  LV E/e' medial:  19.1 LV SV:         39 ml LV SV Index:   18.98    2D Longitudinal Strain LVOT Area:     3.80 cm 2D Strain GLS (A2C):   -20.6 %                         2D Strain GLS (A3C):   -20.7 %                         2D Strain GLS (A4C):   -19.4 %                         2D Strain GLS Avg:     -20.2 %                          3D Volume EF:                         3D EF:        55 %                         LV EDV:       127 ml                         LV ESV:       58 ml                         LV SV:        70 ml RIGHT VENTRICLE RV Basal diam:  3.50 cm RV S prime:     11.30 cm/s TAPSE (M-mode): 2.3 cm LEFT ATRIUM             Index       RIGHT ATRIUM           Index LA diam:        3.90 cm 1.86 cm/m  RA Pressure:  3.00 mmHg LA Vol (A2C):   63.9 ml 30.43 ml/m RA Area:     12.90 cm LA Vol (A4C):   41.1 ml 19.57 ml/m RA Volume:   29.60 ml  14.10 ml/m LA Biplane Vol: 53.7 ml 25.57 ml/m  AORTIC VALVE LVOT Vmax:   81.30 cm/s LVOT Vmean:  52.800 cm/s LVOT VTI:    0.138 m  AORTA Ao Root diam: 4.00 cm Ao Asc diam:  3.80 cm MITRAL VALVE                         TRICUSPID VALVE MV Area (PHT): 4.31 cm              Estimated RAP:  3.00 mmHg MV Peak grad:  11.8 mmHg MV Mean grad:  5.0 mmHg              SHUNTS MV Vmax:       1.72 m/s              Systemic VTI:  0.14 m MV Vmean:      97.1 cm/s             Systemic Diam: 2.20 cm MV VTI:        0.35 m MV PHT:        51.04 msec MV Decel Time: 176 msec MV E velocity: 96.10 cm/s  103 cm/s MV A velocity: 152.00 cm/s 70.3 cm/s MV E/A ratio:  0.63        1.5  Mertie Moores MD Electronically signed by Mertie Moores MD Signature Date/Time: 09/02/2019/2:43:16 PM    Final

## 2019-09-11 NOTE — Assessment & Plan Note (Signed)
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  What is concerning though is that his EF has definitely gone down some.  Question if this is could be related to tachycardia induced cardiomyopathy or simply related to chemotherapy.  Difficult situation that he is feeling better with twice daily dosing of metoprolol, with reduced EF should probably be be on Toprol.  Eventually would be nice to switch back to Toprol once he does not show any signs of recurrence of SVT.  We discussed vagal maneuvers but also the thought of simply being treated via EMS or in the ER.  If he does not wait too long, we should get a get converted.  With him having coronary disease and reduced EF, I am limited on options for PRN pill in pocket options for breakthrough other than amiodarone.  I would not use amiodarone with him for SVT.  If we are able to capture SVT or if there is another episode, may consider EP evaluation and consider SVT ablation.

## 2019-09-11 NOTE — Assessment & Plan Note (Signed)
Neck echo shows relatively well-seated mitral valve.  Left atrial size is still normal.  Murmur seems to be pretty well controlled.  No active heart failure symptoms.

## 2019-09-11 NOTE — Assessment & Plan Note (Signed)
Still very poorly controlled LDL.  He just has not been interested in it at all in treatment.  The plan when I last saw him was for him to see CVRR in the spring after lab check.  Unfortunately this arrived on the time of his diagnosis of lung cancer and that never happened.    Plan: We will allow him to recover to the least the summertime before recheck lipid panel.  At that time if he is not at goal, we will definitely want to discuss treatment options perhaps even potentially considering inclisiran or PCSK9 inhibitors.

## 2019-09-11 NOTE — Assessment & Plan Note (Signed)
Again, remains totally asymptomatic.  He is also very reluctant to take medications.  At this point I think we can take it easy on evaluation.  He is on low-dose metoprolol which he agrees to take because of SVT.  No active angina.  Not currently taking aspirin although will be willing to consider it in the future.  Not on statin and is reluctant to even consider it.

## 2019-09-11 NOTE — Progress Notes (Signed)
These preliminary result these preliminary results were noted.  Awaiting final report.

## 2019-09-12 LAB — AEROBIC CULTURE W GRAM STAIN (SUPERFICIAL SPECIMEN)

## 2019-09-13 ENCOUNTER — Emergency Department (HOSPITAL_COMMUNITY)
Admission: EM | Admit: 2019-09-13 | Discharge: 2019-09-13 | Disposition: A | Discharge status: Home / Self Care | Payer: Federal, State, Local not specified - PPO | Attending: Emergency Medicine | Admitting: Emergency Medicine

## 2019-09-13 ENCOUNTER — Other Ambulatory Visit: Payer: Self-pay

## 2019-09-13 DIAGNOSIS — I959 Hypotension, unspecified: Secondary | ICD-10-CM | POA: Diagnosis present

## 2019-09-13 DIAGNOSIS — I471 Supraventricular tachycardia: Secondary | ICD-10-CM | POA: Diagnosis not present

## 2019-09-13 DIAGNOSIS — I2510 Atherosclerotic heart disease of native coronary artery without angina pectoris: Secondary | ICD-10-CM | POA: Diagnosis not present

## 2019-09-13 DIAGNOSIS — C3431 Malignant neoplasm of lower lobe, right bronchus or lung: Secondary | ICD-10-CM | POA: Diagnosis not present

## 2019-09-13 DIAGNOSIS — Z955 Presence of coronary angioplasty implant and graft: Secondary | ICD-10-CM | POA: Diagnosis not present

## 2019-09-13 DIAGNOSIS — C3491 Malignant neoplasm of unspecified part of right bronchus or lung: Secondary | ICD-10-CM | POA: Diagnosis not present

## 2019-09-13 DIAGNOSIS — I10 Essential (primary) hypertension: Secondary | ICD-10-CM | POA: Diagnosis not present

## 2019-09-13 DIAGNOSIS — Z79899 Other long term (current) drug therapy: Secondary | ICD-10-CM | POA: Diagnosis not present

## 2019-09-13 MED ORDER — METOPROLOL TARTRATE 25 MG PO TABS
25.0000 mg | ORAL_TABLET | Freq: Once | ORAL | Status: AC
  Administered 2019-09-13: 25 mg via ORAL
  Filled 2019-09-13: qty 1

## 2019-09-13 MED ORDER — SODIUM CHLORIDE 0.9 % IV BOLUS
500.0000 mL | Freq: Once | INTRAVENOUS | Status: AC
  Administered 2019-09-13: 500 mL via INTRAVENOUS

## 2019-09-13 MED ORDER — METOPROLOL TARTRATE 25 MG PO TABS: 25.0000 mg | ORAL_TABLET | Freq: Every day | ORAL | 0 refills | Status: DC

## 2019-09-13 NOTE — ED Triage Notes (Signed)
Pt arrives via [facility] from home.   Pt hx of lung cancer and SVT. Pt was at dinner with wife when he felt a flutter in his chest. Upon EMS arrival pt HR was at 213, SVT rhythm. Given 6mg  of adenosine with no conversion, BP 73/50. Pt given 12mg  adenosine with conversion to NSR at about 100 BPM.  Pt received 300cc of NS, BP 139/98 Pt a&ox4, in no apparent distress, no complaints.

## 2019-09-13 NOTE — Discharge Instructions (Addendum)
You will need to continue taking your metoprolol twice daily however in the morning he should take 25 mg and at night you should take 50 mg.  You should call your cardiology office on Monday to schedule an appointment for follow-up.  Please return to the emergency department for any new or worsening symptoms in the meantime.

## 2019-09-13 NOTE — ED Provider Notes (Signed)
[facility] EMERGENCY DEPARTMENT Provider Note   CSN: [id number] Arrival date & time: 09/13/19  2018     History Chief Complaint  Patient presents with  . Hypotension    SVT    Alan [name] is a 64 y.o. male.  HPI   64 year old male with a history of CAD, hyperlipidemia, hypertension, GERD, MVP, STEMI, who presents the emergency department today for evaluation of SVT.  Patient has history of known SVT and follows with Dr. Ellyn Hack with cardiology.  He was actually seen in the ED last week and converted after receiving 18 mg of adenosine.  He is currently on the Toprol 25 mg twice daily.  States he was eating dinner prior to arrival when he experienced palpitations consistent with his prior SVT.  He denies any associated chest pain or significant shortness of breath.  States he has been feeling fatigued earlier this week but otherwise did not have any complaints.  He received 6 mg of adenosine in route with EMS with no change, he then received 12 mg in route and converted to sinus tach.  He reports a history of chronic sinus tachycardia and usually has heart rates around 100 bpm.  Past Medical History:  Diagnosis Date  . CAD S/P percutaneous coronary angioplasty 2004   which showed essentially normal LV size and function, moderately dilated left atrium, moderate mitral prolapse with mild to moderate regurgitation.  . Dyslipidemia, goal LDL below 70   . Essential hypertension   . GERD (gastroesophageal reflux disease)   . History of Mitral valve prolapse    Moderate - with moderate MR, noted February t 2013  . History of Severe mitral regurgitation by prior echocardiogram 02/06/2014   TEE: Severe mitral regurgitation with a flail P2 segment and ruptured; Normal LV size & function, dilated LA.  Marland Kitchen History of stress test 12/2009   he walked 9 mins reaching 10 METS. There was an attenuation artifact in the inferior region but no ischemia or infaract, low risk.  .  Incidental lung nodule, greater than or equal to 29mm 03/16/2014   Ground glass opacity RML noted on CT scan  . lung ca dx'd 03/2019  . PONV (postoperative nausea and vomiting)   . 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 ST elevation myocardial infarction (STEMI) of anterior wall, subsequent episode of care [facility]) 2004   he had a proximal LAD occlusion treated with 2 overlapping 3.5 x 1.8 mm Cypher DES stents.    Patient Active Problem List   Diagnosis Date Noted  . Encounter for antineoplastic immunotherapy 08/13/2019  . SVT (supraventricular tachycardia) ([place]) 07/01/2019  . Elevated troponin level 07/01/2019  . Adenocarcinoma of right lung, stage 3 ([place]) 05/29/2019  . Goals of care, counseling/discussion 05/29/2019  . Encounter for antineoplastic chemotherapy 05/29/2019  . Lung mass   . S/P minimally invasive mitral valve repair 04/08/2014  . Incidental lung nodule, greater than or equal to 70mm 03/16/2014  . CAD S/P percutaneous coronary angioplasty - DES PCI to occluded LAD during anterior STEMI 11/29/2013  . History of Severe mitral regurgitation 11/29/2013  . History of Severe Mitral valve prolapse - severe prolapse of the posterior (P2) leaflet with severe MR 11/29/2013  . Essential hypertension   . Dyslipidemia,  goal LDL below 70   . METATARSALGIA 03/24/2009    Past Surgical History:  Procedure Laterality Date  . ANTERIOR CRUCIATE LIGAMENT REPAIR Left 1993  . CARDIAC CATHETERIZATION  2004   he had a proximal LAD occlusion treated with 2 overlapping 3.5 x 1.8 mm Cypher DES stents.  Marland Kitchen CARDIAC CATHETERIZATION  2007; 03/2014   Widely patent LAD stents, 40% distal LAD, 30% Cx, ~50% PL-PDA bifurcation lesion   .  COLONOSCOPY    . INTRAOPERATIVE TRANSESOPHAGEAL ECHOCARDIOGRAM N/A 04/08/2014   Procedure: INTRAOPERATIVE TRANSESOPHAGEAL ECHOCARDIOGRAM;  Surgeon: Rexene Alberts, MD;  Location: [place];  Service: Open Heart Surgery;  Laterality: N/A;  . LEFT AND RIGHT HEART CATHETERIZATION WITH CORONARY ANGIOGRAM N/A 03/05/2014   Procedure: LEFT AND RIGHT HEART CATHETERIZATION WITH CORONARY ANGIOGRAM;  Surgeon: Leonie Man, MD;  Location: [facility] CATH LAB;  Service: Cardiovascular;  Laterality: N/A;  . LEFT HEART CATHETERIZATION WITH CORONARY ANGIOGRAM N/A 09/15/2011   Procedure: LEFT HEART CATHETERIZATION WITH CORONARY ANGIOGRAM;  Surgeon: Lorretta Harp, MD;  Location: [facility] C[facility] LAB;  Service: Cardiovascular;  Laterality: N/A;  . MITRAL VALVE REPAIR Right 04/08/2014   Procedure: MINIMALLY INVASIVE MITRAL VALVE REPAIR (MVR);  Surgeon: Rexene Alberts, MD;  Location: [place];  Service: Open Heart Surgery;  Laterality: Right;  . NM MYOVIEW LTD  May 2011   Walk 9 minutes, and 10 METs, diaphragmatic attenuation but no ischemia or infarction.  . TEE WITHOUT CARDIOVERSION N/A 02/06/2014   Procedure: TRANSESOPHAGEAL ECHOCARDIOGRAM (TEE);  Surgeon: Pixie Casino, MD;  Normal LV Size U& function - EF 55-60%, no regional WMA.  MV P2 Leaflet is flail with ruptured chord with severe prolapse, anterior leaflet intact.  Severe, eccentric anterior directed MR with dilated LA.  Marland Kitchen TRANSTHORACIC ECHOCARDIOGRAM  07/18/2018   Normal LV size and function.  EF 50-60 %.  Unable to assess diastolic function.  Mitral valve sewing ring in place.  Mild stenosis with a gradient of 6 mmHg noted. -  Domingo Dimes ECHOCARDIOGRAM  June 30 2015   Low normal LV function with EF 50-55%. GR 1 DD. No MR. Normal gradient of 4 mmHg the mitral valve. No prolapse.  Marland Kitchen VIDEO BRONCHOSCOPY WITH ENDOBRONCHIAL ULTRASOUND N/A 05/16/2019   Procedure: VIDEO BRONCHOSCOPY WITH ENDOBRONCHIAL ULTRASOUND;  Surgeon: Marshell Garfinkel, MD;  Location: [place];  Service:  Pulmonary;  Laterality: N/A;       Family History  Problem Relation Age of Onset  . Hypertension Mother   . Heart Problems Father        triple bypass 1989  . Cancer Paternal Grandmother        Pancreatic    Social History   Tobacco Use  . Smoking status: Never Smoker  . Smokeless tobacco: Never Used  Substance Use Topics  . Alcohol use: Yes    Alcohol/week: 10.0 standard drinks    Types: 5 Cans of beer, 5 Shots of liquor per week    Comment: social  . Drug use: No    Home Medications Prior to Admission medications   Medication Sig Start Date End Date Taking? Authorizing Provider  Ascorbic Acid (VITAMIN C) 1000 MG tablet Take 2,000 mg by mouth daily.     [provider]  Coenzyme Q10 (CO Q 10) 100 MG CAPS Take 100 mg by mouth daily.     [provider]  doxycycline (DORYX) 100 MG EC tablet Take 1 tablet (100 mg total) by mouth 2 (two) times daily. 09/10/19   Tanner,  Lyndon Code., PA-C  metoprolol tartrate (LOPRESSOR) 25 MG tablet Take 1 tablet (25 mg total) by mouth at bedtime. 09/13/19 10/13/19  Viraaj Vorndran S, PA-C  mupirocin ointment (BACTROBAN) 2 % Apply to effected area 4 times daily until healed. 09/10/19   Tanner, Lyndon Code., PA-C  predniSONE (DELTASONE) 20 MG tablet 4 tablet p.o. daily for 4 days, then 3 tablet p.o. daily for 4 days then 2 tablets p.o. daily for 4 days then 1 tablet p.o. daily for 4 days then half a tablet p.o. q. days for 7 days. 08/13/19   Curt Bears, MD  Probiotic Product (PROBIOTIC DAILY PO) Take 1 capsule by mouth daily.     [provider]  prochlorperazine (COMPAZINE) 10 MG tablet Take 1 tablet (10 mg total) by mouth every 6 (six) hours as needed for nausea or vomiting. 05/29/19   Curt Bears, MD  Specialty Vitamins Products (PROSTATE PO) Take 1 capsule by mouth daily. PROSTATE FORMULA    [provider]  sucralfate (CARAFATE) 1 g tablet Take 1 tablet (1 g total) by mouth 4 (four) times daily -  with meals and  at bedtime. crush and dissolve in 10 cc of water prior to consuming 06/24/19   Gery Pray, MD  Vitamin D-Vitamin K (D3 + K2 DOTS PO) Take 1 tablet by mouth daily.    [provider]  zolpidem (AMBIEN) 5 MG tablet TAKE 1-2 TABLET BY MOUTH EVERY NIGHT AT BEDTIME AS NEEDED FOR SLEEP 08/31/19   Leonie Man, MD  metoprolol succinate (TOPROL-XL) 25 MG 24 hr tablet Take 1 tablet (25 mg total) by mouth daily. 09/05/19 09/06/19  Leonie Man, MD    Allergies    A-cillin [ampicillin], Amoxicillin, Other, Penicillins, and Sulfa antibiotics  Review of Systems   Review of Systems  Constitutional: Positive for fatigue. Negative for fever.  HENT: Negative for ear pain and sore throat.   Eyes: Negative for visual disturbance.  Respiratory: Negative for cough and shortness of breath.   Cardiovascular: Positive for palpitations (resolved). Negative for chest pain.  Gastrointestinal: Negative for abdominal pain, constipation, diarrhea, nausea and vomiting.  Genitourinary: Negative for hematuria.  Musculoskeletal: Negative for back pain.  Skin: Negative for rash.  Neurological: Negative for headaches.  All other systems reviewed and are negative.   Physical Exam Updated Vital Signs BP 117/75   Pulse 93   Temp (!) 97.5 F (36.4 C) (Axillary)   Resp 17   SpO2 95%   Physical Exam Vitals and nursing note reviewed.  Constitutional:      Appearance: He is well-developed.  HENT:     Head: Normocephalic and atraumatic.  Eyes:     Conjunctiva/sclera: Conjunctivae normal.  Cardiovascular:     Rate and Rhythm: Regular rhythm. Tachycardia present.     Pulses:          Dorsalis pedis pulses are 2+ on the right side and 2+ on the left side.     Heart sounds: No murmur.  Pulmonary:     Effort: Pulmonary effort is normal. No respiratory distress.     Breath sounds: Normal breath sounds. No wheezing, rhonchi or rales.  Abdominal:     General: Bowel sounds are normal. There is no  distension.     Palpations: Abdomen is soft.     Tenderness: There is no abdominal tenderness. There is no guarding or rebound.  Musculoskeletal:        General: No tenderness.     Cervical back: Neck  supple.     Right lower leg: No edema.     Left lower leg: No edema.  Skin:    General: Skin is warm and dry.  Neurological:     Mental Status: He is alert.     ED Results / Procedures / Treatments   Labs (all labs ordered are listed, but only abnormal results are displayed) Labs Reviewed - No data to display  EKG None    Date: 09/14/2019  Rate: 101  Rhythm: sinus tachycardia  QRS Axis: normal  Intervals: normal  ST/T Wave abnormalities: normal  Conduction Disutrbances: none  Narrative Interpretation:   Old EKG Reviewed: No significant changes noted  Radiology No results found.  Procedures Procedures (including critical care time)  8:30 PM Cardiac monitoring reveals sinus tach, HR 100 (Rate & rhythm), as reviewed and interpreted by me. Cardiac monitoring was ordered due to SVT, palpitations, and to monitor patient for dysrhythmia.   Medications Ordered in ED Medications  metoprolol tartrate (LOPRESSOR) tablet 25 mg (25 mg Oral Given 09/13/19 2040)  sodium chloride 0.9 % bolus 500 mL (0 mLs Intravenous Stopped 09/13/19 2204)    ED Course  I have reviewed the triage vital signs and the nursing notes.  Pertinent labs & imaging results that were available during my care of the patient were reviewed by me and considered in my medical decision making (see chart for details).    MDM Rules/Calculators/A&P                      64 y/o male with a history of SVT that is recurrent.  He was seen in the ED 1 week ago and converted after 18 mg of adenosine.  Screening labs were reassuring at that time.  He has followed with cardiology since then and had uptitrated his metoprolol.  In route patient received 6 mg adenosine and then 12 mg of adenosine.  His BP was soft in route  however on arrival BP was in the 856D systolic.  On arrival heart rate was in sinus tach.  Reviewed tele strips from EMS which showed SVT with conversion to sinus tachycardia EKG with sinus tach, no ischemic changes  8:53 PM Discussed case with Dr. Rhae Hammock who recommends increasing the patient's metoprolol to 25 mg in the a.m. and 50 mg in p.m.  States patient may be a good candidate for ablation.  He does not feel that the patient needs to be admitted at that time and can follow-up as an outpatient.  He will help facilitate arranging follow-up with cardiology.  Evaluated patient.  His blood pressure remained stable and he remains in sinus tachycardia which she states is chronic for him.  Discussed plan for increasing his metoprolol and following up with closely with cardiology.  He voices understanding the plan and reasons to return.  All questions answered with patient before discharge.  Final Clinical Impression(s) / ED Diagnoses Final diagnoses:  SVT (supraventricular tachycardia) ([place])    Rx / DC Orders ED Discharge Orders         Ordered    metoprolol tartrate (LOPRESSOR) 25 MG tablet  Daily at bedtime     09/13/19 2241           Jaris Kohles S, PA-C 09/14/19 0008    Noemi Chapel, MD 09/14/19 [place]

## 2019-09-15 ENCOUNTER — Telehealth: Payer: Self-pay | Admitting: Cardiology

## 2019-09-15 ENCOUNTER — Telehealth: Payer: Self-pay | Admitting: Physician Assistant

## 2019-09-15 MED ORDER — METOPROLOL TARTRATE 25 MG PO TABS: ORAL_TABLET | ORAL | 3 refills | Status: DC

## 2019-09-15 NOTE — Telephone Encounter (Signed)
Called and changed prescription per Dr. Ellyn Hack. Pt verbalized understanding.

## 2019-09-15 NOTE — Telephone Encounter (Signed)
Returned patient's phone call regarding rescheduling 02/04 appointment, per patient's request appointment has moved to 02/02.

## 2019-09-15 NOTE — Telephone Encounter (Signed)
Sounds reasonable.  Glenetta Hew, MD

## 2019-09-15 NOTE — Telephone Encounter (Signed)
Patient states he recently had an SVT episode and he was admitted into the hospital where he was advised to increase intake of Metoprolol tartrate to taking 1 tablet (25 mg) in the morning and 2 tablets (50 mg) in the evening. Patient is seeking prescription adjustment approval. Please call and advise.

## 2019-09-16 ENCOUNTER — Inpatient Hospital Stay: Payer: Federal, State, Local not specified - PPO | Attending: Internal Medicine | Admitting: Physician Assistant

## 2019-09-16 ENCOUNTER — Encounter: Payer: Self-pay | Admitting: Physician Assistant

## 2019-09-16 ENCOUNTER — Other Ambulatory Visit: Payer: Self-pay

## 2019-09-16 VITALS — BP 114/80 | HR 98 | Temp 98.1°F | Resp 18 | Ht 72.0 in | Wt 176.6 lb

## 2019-09-16 DIAGNOSIS — C3431 Malignant neoplasm of lower lobe, right bronchus or lung: Secondary | ICD-10-CM | POA: Insufficient documentation

## 2019-09-16 DIAGNOSIS — I471 Supraventricular tachycardia: Secondary | ICD-10-CM | POA: Insufficient documentation

## 2019-09-16 DIAGNOSIS — E785 Hyperlipidemia, unspecified: Secondary | ICD-10-CM | POA: Diagnosis not present

## 2019-09-16 DIAGNOSIS — I10 Essential (primary) hypertension: Secondary | ICD-10-CM | POA: Diagnosis not present

## 2019-09-16 DIAGNOSIS — Z79899 Other long term (current) drug therapy: Secondary | ICD-10-CM | POA: Diagnosis not present

## 2019-09-16 DIAGNOSIS — Z5112 Encounter for antineoplastic immunotherapy: Secondary | ICD-10-CM | POA: Diagnosis present

## 2019-09-16 DIAGNOSIS — C3491 Malignant neoplasm of unspecified part of right bronchus or lung: Secondary | ICD-10-CM | POA: Diagnosis not present

## 2019-09-16 NOTE — Progress Notes (Signed)
[facility] OFFICE PROGRESS NOTE  Alan [name], L.[name], [place] [address] [place] [address]  DIAGNOSIS: Stage IIIb (T2b, N3, M0) non-small cell lung cancer, adenocarcinoma presented with right lower lobe lung nodule in addition to right hilar mass/node as well as ipsilateral and contralateral mediastinal lymphadenopathy diagnosed in October 2020.  Molecular studies by[employer] 360showed no actionable mutations.  PRIOR THERAPY: Concurrent chemoradiation with weekly carboplatin for AUC of 2 and paclitaxel 45 mg/M2. Status post7cycles. Last dose on 07/21/2019.  CURRENT THERAPY:  Immunotherapy with Imfinzi 1500 mg IV every 4 weeks. First dose expected on 09/04/2019. S/p 1 cycle.  INTERVAL HISTORY: Alan [name] 64 y.o. male returns to the clinic for regular visit for followup. Patient has completed one cycle of Imfinzi. Tolerates it well. He is reporting "skin redness, with burning sensation and tenderness to touch". Notes also significant hair loss on back since completing radiation treatment.   Of note, he has been experiencing episodes of SVT with subsequent emergency department visits. First episode on 09/06/2019.  At that time he was converted to NSR with adenosine. He experienced another episode of SVT on Saturday, 09/13/2019, resulting in visit to ED. Converted to NSR with 6 mg then 12 mg of adenosine. Cardiologist consulted and suggested increase in Metoprolol to 25 mg in AM, and 50 mg in evening. Per cardiology, will be a candidate for ablation.   Right buttock abscess, incised and drained on 09/10/2019, treated with bactroban and doxycycline, improving per patient report. He denies any fever, chills, night sweats, or weight loss. Denies any chest pain, shortness of breath, cough, or hemoptysis. Denies any nausea, vomiting, diarrhea, or constipation. Denies any headaches or visual changes. He is here for a follow up visit after completing his first cycle of  immunotherapy with Imfinzi.   MEDICAL HISTORY: Past Medical History:  Diagnosis Date  . CAD S/P percutaneous coronary angioplasty 2004   which showed essentially normal LV size and function, moderately dilated left atrium, moderate mitral prolapse with mild to moderate regurgitation.  . Dyslipidemia, goal LDL below 70   . Essential hypertension   . GERD (gastroesophageal reflux disease)   . History of Mitral valve prolapse    Moderate - with moderate MR, noted February t 2013  . History of Severe mitral regurgitation by prior echocardiogram 02/06/2014   TEE: Severe mitral regurgitation with a flail P2 segment and ruptured; Normal LV size & function, dilated LA.  Marland Kitchen History of stress test 12/2009   he walked 9 mins reaching 10 METS. There was an attenuation artifact in the inferior region but no ischemia or infaract, low risk.  . Incidental lung nodule, greater than or equal to 46mm 03/16/2014   Ground glass opacity RML noted on CT scan  . lung ca dx'd 03/2019  . PONV (postoperative nausea and vomiting)   . 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 ST elevation myocardial infarction (STEMI) of anterior wall, subsequent episode of care [facility]) 2004   he had a proximal LAD occlusion treated with 2 overlapping 3.5 x 1.8 mm Cypher DES stents.    ALLERGIES:  is allergic to a-cillin [ampicillin]; amoxicillin; other; penicillins; and sulfa antibiotics.  MEDICATIONS:  Current Outpatient Medications  Medication Sig Dispense Refill  . Ascorbic  Acid (VITAMIN C) 1000 MG tablet Take 2,000 mg by mouth daily.     . Coenzyme Q10 (CO Q 10) 100 MG CAPS Take 100 mg by mouth daily.     Marland Kitchen doxycycline (DORYX) 100 MG EC tablet Take 1 tablet  (100 mg total) by mouth 2 (two) times daily. 14 tablet 0  . metoprolol tartrate (LOPRESSOR) 25 MG tablet Take one tablet in the AM and 2 tablets in the PM. 270 tablet 3  . mupirocin ointment (BACTROBAN) 2 % Apply to effected area 4 times daily until healed. 30 g 1  . Probiotic Product (PROBIOTIC DAILY PO) Take 1 capsule by mouth daily.     Marland Kitchen Specialty Vitamins Products (PROSTATE PO) Take 1 capsule by mouth daily. PROSTATE FORMULA    . Vitamin D-Vitamin K (D3 + K2 DOTS PO) Take 1 tablet by mouth daily.    . prochlorperazine (COMPAZINE) 10 MG tablet Take 1 tablet (10 mg total) by mouth every 6 (six) hours as needed for nausea or vomiting. (Patient not taking: Reported on 09/16/2019) 30 tablet 0  . zolpidem (AMBIEN) 5 MG tablet TAKE 1-2 TABLET BY MOUTH EVERY NIGHT AT BEDTIME AS NEEDED FOR SLEEP (Patient not taking: Reported on 09/16/2019) 120 tablet 2   No current facility-administered medications for this visit.    SURGICAL HISTORY:  Past Surgical History:  Procedure Laterality Date  . ANTERIOR CRUCIATE LIGAMENT REPAIR Left 1993  . CARDIAC CATHETERIZATION  2004   he had a proximal LAD occlusion treated with 2 overlapping 3.5 x 1.8 mm Cypher DES stents.  Marland Kitchen CARDIAC CATHETERIZATION  2007; 03/2014   Widely patent LAD stents, 40% distal LAD, 30% Cx, ~50% PL-PDA bifurcation lesion   . COLONOSCOPY    . INTRAOPERATIVE TRANSESOPHAGEAL ECHOCARDIOGRAM N/A 04/08/2014   Procedure: INTRAOPERATIVE TRANSESOPHAGEAL ECHOCARDIOGRAM;  Surgeon: Rexene Alberts, MD;  Location: [facility];  Service: Open Heart Surgery;  Laterality: N/A;  . LEFT AND RIGHT HEART CATHETERIZATION WITH CORONARY ANGIOGRAM N/A 03/05/2014   Procedure: LEFT AND RIGHT HEART CATHETERIZATION WITH CORONARY ANGIOGRAM;  Surgeon: Leonie Man, MD;  Location: [facility] CATH LAB;  Service: Cardiovascular;  Laterality: N/A;  . LEFT HEART CATHETERIZATION WITH CORONARY ANGIOGRAM N/A 09/15/2011   Procedure: LEFT HEART CATHETERIZATION WITH CORONARY ANGIOGRAM;  Surgeon:  Lorretta Harp, MD;  Location: [facility] C[facility] LAB;  Service: Cardiovascular;  Laterality: N/A;  . MITRAL VALVE REPAIR Right 04/08/2014   Procedure: MINIMALLY INVASIVE MITRAL VALVE REPAIR (MVR);  Surgeon: Rexene Alberts, MD;  Location: [facility];  Service: Open Heart Surgery;  Laterality: Right;  . NM MYOVIEW LTD  May 2011   Walk 9 minutes, and 10 METs, diaphragmatic attenuation but no ischemia or infarction.  . TEE WITHOUT CARDIOVERSION N/A 02/06/2014   Procedure: TRANSESOPHAGEAL ECHOCARDIOGRAM (TEE);  Surgeon: Pixie Casino, MD;  Normal LV Size U& function - EF 55-60%, no regional WMA.  MV P2 Leaflet is flail with ruptured chord with severe prolapse, anterior leaflet intact.  Severe, eccentric anterior directed MR with dilated LA.  Marland Kitchen TRANSTHORACIC ECHOCARDIOGRAM  07/18/2018   Normal LV size and function.  EF 50-60 %.  Unable to assess diastolic function.  Mitral valve sewing ring in place.  Mild stenosis with a gradient of 6 mmHg noted. -  Domingo Dimes ECHOCARDIOGRAM  June 30 2015   Low normal LV function with EF 50-55%. GR 1 DD. No MR. Normal gradient of 4 mmHg the mitral valve. No prolapse.  Marland Kitchen VIDEO BRONCHOSCOPY WITH ENDOBRONCHIAL ULTRASOUND N/A 05/16/2019  Procedure: VIDEO BRONCHOSCOPY WITH ENDOBRONCHIAL ULTRASOUND;  Surgeon: Marshell Garfinkel, MD;  Location: [facility];  Service: Pulmonary;  Laterality: N/A;    REVIEW OF SYSTEMS:   Review of Systems  Constitutional: Negative for appetite change, chills, fatigue, fever and unexpected weight change.  HENT:   Negative for mouth sores, nosebleeds, sore throat and trouble swallowing.  Eyes: Negative for eye problems and icterus.  Respiratory: Negative for cough, hemoptysis, worsening shortness of breath and wheezing.   Cardiovascular: Negative for chest pain and leg swelling.  Gastrointestinal: Negative for abdominal pain, constipation, diarrhea, nausea and vomiting.  Genitourinary: Negative for bladder incontinence, difficulty urinating, dysuria,  frequency and hematuria.   Musculoskeletal: Negative for back pain, gait problem, neck pain and neck stiffness.  Skin: Endorses burning and redness of chest and abdomen, with tenderness. Negative for itching and rash.  Neurological: Negative for dizziness, extremity weakness, gait problem, headaches, light-headedness and seizures.  Hematological: Negative for adenopathy. Does not bruise/bleed easily.  Psychiatric/Behavioral: Negative for confusion, depression and sleep disturbance. The patient is not nervous/anxious.     PHYSICAL EXAMINATION:  Blood pressure 114/80, pulse 98, temperature 98.1 F (36.7 C), temperature source Temporal, resp. rate 18, height 6' (1.829 m), weight 176 lb 9.6 oz (80.1 kg), SpO2 99 %.  ECOG PERFORMANCE STATUS: 1 - Symptomatic but completely ambulatory  Physical Exam  Constitutional: Oriented to person, place, and time and well-developed, well-nourished, and in no distress.  HENT:  Head: Normocephalic and atraumatic.  Mouth/Throat: Oropharynx is clear and moist. No oropharyngeal exudate.  Eyes: Conjunctivae are normal. Right eye exhibits no discharge. Left eye exhibits no discharge. No scleral icterus.  Neck: Normal range of motion. Neck supple.  Cardiovascular: Normal rate, regular rhythm, normal heart sounds and intact distal pulses.   Pulmonary/Chest: Effort normal and breath sounds normal. No respiratory distress. No wheezes. No rales.  Abdominal: Soft. Bowel sounds are normal. Exhibits no distension and no mass. There is no tenderness.  Musculoskeletal: Normal range of motion. Exhibits no edema.  Lymphadenopathy:    No cervical or supraclavicular adenopathy.  Neurological: Alert and oriented to person, place, and time. Exhibits normal muscle tone. Gait normal. Coordination normal.  Skin: Skin is warm and dry. Bilateral foci under rib cage of linear erythematous macules. Tender to touch. No scaling, or vesicles. Diffuse faint erythema on chest and abdomen.  Not diaphoretic. No pallor.  Psychiatric: Mood, memory and judgment normal.  Vitals reviewed.  LABORATORY DATA: Lab Results  Component Value Date   WBC 6.4 09/06/2019   HGB 11.8 (L) 09/06/2019   HCT 37.1 (L) 09/06/2019   MCV 103.6 (H) 09/06/2019   PLT 138 (L) 09/06/2019      Chemistry      Component Value Date/Time   NA 138 09/06/2019 1130   K 4.0 09/06/2019 1130   CL 104 09/06/2019 1130   CO2 23 09/06/2019 1130   BUN 16 09/06/2019 1130   CREATININE 0.79 09/06/2019 1130   CREATININE 0.77 09/04/2019 1426   CREATININE 0.80 02/27/2014 1152      Component Value Date/Time   CALCIUM 8.8 (L) 09/06/2019 1130   ALKPHOS 75 09/06/2019 1130   AST 30 09/06/2019 1130   AST 12 (L) 09/04/2019 1426   ALT 25 09/06/2019 1130   ALT 14 09/04/2019 1426   BILITOT 0.9 09/06/2019 1130   BILITOT 0.5 09/04/2019 1426       RADIOGRAPHIC STUDIES:  ECHOCARDIOGRAM COMPLETE  Result Date: 09/02/2019   ECHOCARDIOGRAM REPORT   Patient Name:   Alan  Eligha [name] Date of Exam: 09/02/2019 Medical Rec #:  [id number]        Height:       76.0 in Accession #:    [id number]       Weight:       176.4 lb Date of Birth:  08/06/1956         BSA:          2.10 m Patient Age:    89 years         BP:           109/73 mmHg Patient Gender: M                HR:           84 bpm. Exam Location:  [facility] Procedure: 2D Echo, 3D Echo, Cardiac Doppler, Color Doppler and Strain Analysis Indications:    I25.1 CAD  History:        Patient has prior history of Echocardiogram examinations, most                 recent 07/18/2018. CAD and Previous Myocardial Infarction, Mitral                 Valve Prolapse, Arrythmias:SVT; Risk Factors:Hypertension and                 Dyslipidemia. Mitral Valve: annuloplasty ring repair valve is                 present in the mitral position. Mitral valve repair.  Sonographer:    [place] Referring Phys: [place]  1. Left ventricular ejection fraction, by visual  estimation, is 40 to 45%. The left ventricle has mildly decreased function. There is no left ventricular hypertrophy.  2. Left ventricular diastolic parameters are consistent with Grade I diastolic dysfunction (impaired relaxation).  3. The left ventricle has no regional wall motion abnormalities.  4. Global right ventricle has normal systolic function.The right ventricular size is normal. No increase in right ventricular wall thickness.  5. Left atrial size was normal.  6. Right atrial size was normal.  7. The mitral valve has been repaired/replaced. Trivial mitral valve regurgitation.  8. The tricuspid valve is grossly normal.  9. The tricuspid valve is grossly normal. Tricuspid valve regurgitation is trivial. 10. The aortic valve is tricuspid. Aortic valve regurgitation is not visualized. 11. The pulmonic valve was grossly normal. Pulmonic valve regurgitation is not visualized. 12. Aortic dilatation noted. 13. 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 14. The atrial septum is grossly normal. 15. The average left ventricular global longitudinal strain is -20.2 %. In comparison to the previous echocardiogram(s): 07/18/18 EF 50-55%. MV 24mmHg. FINDINGS  Left Ventricle: Left ventricular ejection fraction, by visual estimation, is 40 to 45%. The left ventricle has mildly decreased function. The average left ventricular global longitudinal strain is -20.2 %. The left ventricle has no regional wall motion abnormalities. There is no left ventricular hypertrophy. Left ventricular diastolic parameters are consistent with Grade I diastolic dysfunction (impaired relaxation). Right Ventricle: The right ventricular size is normal. No increase in right ventricular wall thickness. Global RV systolic function is has normal systolic function. Left Atrium: Left atrial size was normal in size. Right Atrium: Right atrial size was normal in size Pericardium: There is no evidence of pericardial  effusion. Mitral Valve: The mitral valve has been repaired/replaced. Trivial mitral valve regurgitation. MV peak  gradient, 11.8 mmHg. Tricuspid Valve: The tricuspid valve is grossly normal. Tricuspid valve regurgitation is trivial. Aortic Valve: The aortic valve is tricuspid. Aortic valve regurgitation is not visualized. Pulmonic Valve: The pulmonic valve was grossly normal. Pulmonic valve regurgitation is not visualized. Pulmonic regurgitation is not visualized. Aorta: Aortic dilatation noted. 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 IAS/Shunts: The atrial septum is grossly normal.  LEFT VENTRICLE PLAX 2D LVIDd:         4.20 cm  Diastology LVIDs:         3.15 cm  LV e' lateral:   6.83 cm/s LV PW:         1.00 cm  LV E/e' lateral: 14.1 LV IVS:        0.90 cm  LV e' medial:    5.03 cm/s LVOT diam:     2.20 cm  LV E/e' medial:  19.1 LV SV:         39 ml LV SV Index:   18.98    2D Longitudinal Strain LVOT Area:     3.80 cm 2D Strain GLS (A2C):   -20.6 %                         2D Strain GLS (A3C):   -20.7 %                         2D Strain GLS (A4C):   -19.4 %                         2D Strain GLS Avg:     -20.2 %                          3D Volume EF:                         3D EF:        55 %                         LV EDV:       127 ml                         LV ESV:       58 ml                         LV SV:        70 ml RIGHT VENTRICLE RV Basal diam:  3.50 cm RV S prime:     11.30 cm/s TAPSE (M-mode): 2.3 cm LEFT ATRIUM             Index       RIGHT ATRIUM           Index LA diam:        3.90 cm 1.86 cm/m  RA Pressure: 3.00 mmHg LA Vol (A2C):   63.9 ml 30.43 ml/m RA Area:     12.90 cm LA Vol (A4C):   41.1 ml 19.57 ml/m RA Volume:   29.60 ml  14.10 ml/m LA Biplane Vol: 53.7 ml 25.57 ml/m  AORTIC VALVE LVOT Vmax:   81.30 cm/s LVOT Vmean:  52.800 cm/s LVOT VTI:    0.138 m  AORTA Ao Root diam: 4.00 cm Ao Asc diam:  3.80 cm MITRAL VALVE                         TRICUSPID VALVE  MV Area (PHT): 4.31 cm              Estimated RAP:  3.00 mmHg MV Peak grad:  11.8 mmHg MV Mean grad:  5.0 mmHg              SHUNTS MV Vmax:       1.72 m/s              Systemic VTI:  0.14 m MV Vmean:      97.1 cm/s             Systemic Diam: 2.20 cm MV VTI:        0.35 m MV PHT:        51.04 msec MV Decel Time: 176 msec MV E velocity: 96.10 cm/s  103 cm/s MV A velocity: 152.00 cm/s 70.3 cm/s MV E/A ratio:  0.63        1.5  Mertie Moores MD Electronically signed by Mertie Moores MD Signature Date/Time: 09/02/2019/2:43:16 PM    Final      ASSESSMENT/PLAN:  This is a very pleasant 64 year old Caucasian male diagnosed with stage IIIb non-small cell lung cancer, adenocarcinoma. He presented with a rightlower lobe lung mass and right hilar lymphadenopathy and ipsilateral and contralateral mediastinal lymphadenopathy. He has no actionable mutations. He was diagnosed in October 2020.  He is status post 7 cycles of concurrent chemoradiation with carboplatin for an AUC of 2 and paclitaxel 45 mg/m2. He experienced odynophagia, dysphagia, and radiation induced esophagitis. He was placed on a prednisone taper and is almost finished with his course. He is feeling better at this time.    Patient is currently undergoing treatment with Imfinzi 1500 mg IV every 4 weeks. S/p 1 cycle, tolerated fairly well.  Patient was seen with Dr. Julien Nordmann with recommendation to continue with same treatment.  We will follow up in 2 weeks for evaluation before cycle #2.  Recommend patient continue to follow-up with cardiologist for management of SVT.  Patient knows to call if worsening or significant rash in the interval. The patient was advised to call immediately if he has any concerning symptoms in the interval.  The patient voices understanding of current disease status and treatment options and is in agreement with the current care plan. All questions were answered. The patient knows to call the clinic with any problems,  questions or concerns. We can certainly see the patient much sooner if necessary   Kavin Weckwerth L Merced Hanners, PA-C 09/16/19  ADDENDUM: Hematology/Oncology Attending: I had a face-to-face encounter with the patient today.  I recommended his care plan.  This is a very pleasant 64 years old white male with history of stage IIIb non-small cell lung cancer, adenocarcinoma status post concurrent chemoradiation with partial response. The patient is currently undergoing consolidation treatment with Imfinzi every 4 weeks is status post 1 cycle.  He tolerated the first cycle of his treatment fairly well with no concerning adverse effects. He continues to have episodes of SVT and he is followed by cardiology.  I am not sure if his SVT is related to his current or previous treatment and this will need to be treated aggressively by his cardiologist. I recommended for the patient to continue his treatment with Imfinzi as planned.  He  will come back for follow-up visit in 2 weeks for evaluation before the start of cycle #2. He was advised to call immediately if he has any concerning symptoms in the interval.  Disclaimer: This note was dictated with voice recognition software. Similar sounding words can inadvertently be transcribed and may be missed upon review. Eilleen Kempf, MD 09/16/19

## 2019-09-18 ENCOUNTER — Ambulatory Visit: Payer: Federal, State, Local not specified - PPO | Admitting: Physician Assistant

## 2019-09-18 NOTE — Progress Notes (Signed)
These preliminary result these preliminary results were noted.  Awaiting final report.

## 2019-09-24 ENCOUNTER — Ambulatory Visit: Payer: Federal, State, Local not specified - PPO | Admitting: Cardiology

## 2019-09-25 ENCOUNTER — Other Ambulatory Visit: Payer: Self-pay

## 2019-09-25 ENCOUNTER — Emergency Department (HOSPITAL_COMMUNITY)
Admission: EM | Admit: 2019-09-25 | Discharge: 2019-09-25 | Disposition: A | Discharge status: Home / Self Care | Payer: Federal, State, Local not specified - PPO | Attending: Emergency Medicine | Admitting: Emergency Medicine

## 2019-09-25 ENCOUNTER — Encounter (HOSPITAL_COMMUNITY): Payer: Self-pay | Admitting: Emergency Medicine

## 2019-09-25 DIAGNOSIS — I252 Old myocardial infarction: Secondary | ICD-10-CM | POA: Diagnosis not present

## 2019-09-25 DIAGNOSIS — I10 Essential (primary) hypertension: Secondary | ICD-10-CM | POA: Diagnosis not present

## 2019-09-25 DIAGNOSIS — Z85118 Personal history of other malignant neoplasm of bronchus and lung: Secondary | ICD-10-CM | POA: Insufficient documentation

## 2019-09-25 DIAGNOSIS — I2510 Atherosclerotic heart disease of native coronary artery without angina pectoris: Secondary | ICD-10-CM | POA: Diagnosis not present

## 2019-09-25 DIAGNOSIS — R002 Palpitations: Secondary | ICD-10-CM | POA: Insufficient documentation

## 2019-09-25 DIAGNOSIS — Z79899 Other long term (current) drug therapy: Secondary | ICD-10-CM | POA: Diagnosis not present

## 2019-09-25 DIAGNOSIS — I471 Supraventricular tachycardia: Secondary | ICD-10-CM

## 2019-09-25 DIAGNOSIS — R42 Dizziness and giddiness: Secondary | ICD-10-CM | POA: Diagnosis present

## 2019-09-25 LAB — CBC WITH DIFFERENTIAL/PLATELET
Abs Immature Granulocytes: 0.03 10*3/uL (ref 0.00–0.07)
Basophils Absolute: 0 10*3/uL (ref 0.0–0.1)
Basophils Relative: 1 %
Eosinophils Absolute: 0.2 10*3/uL (ref 0.0–0.5)
Eosinophils Relative: 4 %
HCT: 36.5 % — ABNORMAL LOW (ref 39.0–52.0)
Hemoglobin: 11.9 g/dL — ABNORMAL LOW (ref 13.0–17.0)
Immature Granulocytes: 1 %
Lymphocytes Relative: 11 %
Lymphs Abs: 0.6 10*3/uL — ABNORMAL LOW (ref 0.7–4.0)
MCH: 32.1 pg (ref 26.0–34.0)
MCHC: 32.6 g/dL (ref 30.0–36.0)
MCV: 98.4 fL (ref 80.0–100.0)
Monocytes Absolute: 0.6 10*3/uL (ref 0.1–1.0)
Monocytes Relative: 11 %
Neutro Abs: 4 10*3/uL (ref 1.7–7.7)
Neutrophils Relative %: 72 %
Platelets: 231 10*3/uL (ref 150–400)
RBC: 3.71 MIL/uL — ABNORMAL LOW (ref 4.22–5.81)
RDW: 12.7 % (ref 11.5–15.5)
WBC: 5.4 10*3/uL (ref 4.0–10.5)
nRBC: 0 % (ref 0.0–0.2)

## 2019-09-25 LAB — BASIC METABOLIC PANEL
Anion gap: 12 (ref 5–15)
BUN: 14 mg/dL (ref 8–23)
CO2: 21 mmol/L — ABNORMAL LOW (ref 22–32)
Calcium: 9.1 mg/dL (ref 8.9–10.3)
Chloride: 107 mmol/L (ref 98–111)
Creatinine, Ser: 0.78 mg/dL (ref 0.61–1.24)
GFR calc Af Amer: >60 mL/min (ref >60)
GFR calc non Af Amer: >60 mL/min (ref >60)
Glucose, Bld: 103 mg/dL — ABNORMAL HIGH (ref 70–99)
Potassium: 3.6 mmol/L (ref 3.5–5.1)
Sodium: 140 mmol/L (ref 135–145)

## 2019-09-25 NOTE — ED Notes (Signed)
Discharge instructions discussed with pt. Pt was able to verbalized understanding with no other questions at this time. Will follow up with cardiologist. Pt. To go home with wife.

## 2019-09-25 NOTE — ED Triage Notes (Signed)
Pt arrives via EMS with reports of SVT. Pt took his 50 of metoprolol but could still tell his HR was elevated. EMS gave 6 mg adenosine and 500 cc NS and pt converted to NSR. Pt denies any pain.

## 2019-09-25 NOTE — ED Provider Notes (Signed)
[facility] EMERGENCY DEPARTMENT Provider Note   CSN: [id number] Arrival date & time: 09/25/19  1901     History Chief Complaint  Patient presents with  . svt    Alan [name] is a 64 y.o. male.  The history is provided by the patient and medical records.  The patient is a 64 year old male with a past medical history of coronary disease, lung cancer, SVT who presents to the ED for lightheadedness, palpitations.  Patient has had frequent episodes of SVT recently which typically come on with standing.  Patient's symptoms had sudden onset just prior to arrival, patient took metoprolol p.o. without improvement.  Upon EMS arrival patient was SVT and was given 6 mg adenosine and 500 cc normal saline bolus with conversion to normal sinus rhythm.  Symptoms are moderate, symptoms have resolved.  Patient denies any chest pain, shortness of breath, nausea, vomiting, diarrhea.     Past Medical History:  Diagnosis Date  . CAD S/P percutaneous coronary angioplasty 2004   which showed essentially normal LV size and function, moderately dilated left atrium, moderate mitral prolapse with mild to moderate regurgitation.  . Dyslipidemia, goal LDL below 70   . Essential hypertension   . GERD (gastroesophageal reflux disease)   . History of Mitral valve prolapse    Moderate - with moderate MR, noted February t 2013  . History of Severe mitral regurgitation by prior echocardiogram 02/06/2014   TEE: Severe mitral regurgitation with a flail P2 segment and ruptured; Normal LV size & function, dilated LA.  Marland Kitchen History of stress test 12/2009   he walked 9 mins reaching 10 METS. There was an attenuation artifact in the inferior region but no ischemia or infaract, low risk.  . Incidental lung nodule, greater than or equal to 85mm 03/16/2014   Ground glass opacity RML noted on CT scan  . lung ca dx'd 03/2019  . PONV (postoperative nausea and vomiting)   . S/P Minimally Invasive MVR (mitral valve  repair) 04/08/2014   Complex valvuloplasty including quadrangular resection of flail segment of posterior leaflet, sliding leaflet plasty, chordal transfer x1, Gore-tex neocord placement x4 and 34 mm Sorin Memo 3D rechord ring annuloplasty via right mini thoracotomy approach  . ST elevation myocardial infarction (STEMI) of anterior wall, subsequent episode of care [facility]) 2004   he had a proximal LAD occlusion treated with 2 overlapping 3.5 x 1.8 mm Cypher DES stents.    Patient Active Problem List   Diagnosis Date Noted  . Encounter for antineoplastic immunotherapy 08/13/2019  . SVT (supraventricular tachycardia) ([place]) 07/01/2019  . Elevated troponin level 07/01/2019  . Adenocarcinoma of right lung, stage 3 ([place]) 05/29/2019  . Goals of care, counseling/discussion 05/29/2019  . Encounter for antineoplastic chemotherapy 05/29/2019  . Lung mass   . S/P minimally invasive mitral valve repair 04/08/2014  . Incidental lung nodule, greater than or equal to 7mm 03/16/2014  . CAD S/P percutaneous coronary angioplasty - DES PCI to occluded LAD during anterior STEMI 11/29/2013  . History of Severe mitral regurgitation 11/29/2013  . History of Severe Mitral valve prolapse - severe prolapse of the posterior (P2) leaflet with severe MR 11/29/2013  . Essential hypertension   . Dyslipidemia, goal LDL below 70   . METATARSALGIA 03/24/2009    Past Surgical History:  Procedure Laterality Date  . ANTERIOR CRUCIATE LIGAMENT REPAIR Left 1993  . CARDIAC CATHETERIZATION  2004   he had a proximal LAD occlusion treated with 2 overlapping 3.5 x 1.8  mm Cypher DES stents.  Marland Kitchen CARDIAC CATHETERIZATION  2007; 03/2014   Widely patent LAD stents, 40% distal LAD, 30% Cx, ~50% PL-PDA bifurcation lesion   . COLONOSCOPY    . INTRAOPERATIVE TRANSESOPHAGEAL ECHOCARDIOGRAM N/A 04/08/2014   Procedure: INTRAOPERATIVE TRANSESOPHAGEAL ECHOCARDIOGRAM;  Surgeon: Rexene Alberts, MD;  Location: [facility];  Service: Open Heart Surgery;   Laterality: N/A;  . LEFT AND RIGHT HEART CATHETERIZATION WITH CORONARY ANGIOGRAM N/A 03/05/2014   Procedure: LEFT AND RIGHT HEART CATHETERIZATION WITH CORONARY ANGIOGRAM;  Surgeon: Leonie Man, MD;  Location: [facility] CATH LAB;  Service: Cardiovascular;  Laterality: N/A;  . LEFT HEART CATHETERIZATION WITH CORONARY ANGIOGRAM N/A 09/15/2011   Procedure: LEFT HEART CATHETERIZATION WITH CORONARY ANGIOGRAM;  Surgeon: Lorretta Harp, MD;  Location: [facility] CATH LAB;  Service: Cardiovascular;  Laterality: N/A;  . MITRAL VALVE REPAIR Right 04/08/2014   Procedure: MINIMALLY INVASIVE MITRAL VALVE REPAIR (MVR);  Surgeon: Rexene Alberts, MD;  Location: [place];  Service: Open Heart Surgery;  Laterality: Right;  . NM MYOVIEW LTD  May 2011   Walk 9 minutes, and 10 METs, diaphragmatic attenuation but no ischemia or infarction.  . TEE WITHOUT CARDIOVERSION N/A 02/06/2014   Procedure: TRANSESOPHAGEAL ECHOCARDIOGRAM (TEE);  Surgeon: Pixie Casino, MD;  Normal LV Size U& function - EF 55-60%, no regional WMA.  MV P2 Leaflet is flail with ruptured chord with severe prolapse, anterior leaflet intact.  Severe, eccentric anterior directed MR with dilated LA.  Marland Kitchen TRANSTHORACIC ECHOCARDIOGRAM  07/18/2018   Normal LV size and function.  EF 50-60 %.  Unable to assess diastolic function.  Mitral valve sewing ring in place.  Mild stenosis with a gradient of 6 mmHg noted. -  Domingo Dimes ECHOCARDIOGRAM  June 30 2015   Low normal LV function with EF 50-55%. GR 1 DD. No MR. Normal gradient of 4 mmHg the mitral valve. No prolapse.  Marland Kitchen VIDEO BRONCHOSCOPY WITH ENDOBRONCHIAL ULTRASOUND N/A 05/16/2019   Procedure: VIDEO BRONCHOSCOPY WITH ENDOBRONCHIAL ULTRASOUND;  Surgeon: Marshell Garfinkel, MD;  Location: [place];  Service: Pulmonary;  Laterality: N/A;       Family History  Problem Relation Age of Onset  . Hypertension Mother   . Heart Problems Father        triple bypass 1989  . Cancer Paternal Grandmother        Pancreatic     Social History   Tobacco Use  . Smoking status: Never Smoker  . Smokeless tobacco: Never Used  Substance Use Topics  . Alcohol use: Yes    Alcohol/week: 10.0 standard drinks    Types: 5 Cans of beer, 5 Shots of liquor per week    Comment: social  . Drug use: No    Home Medications Prior to Admission medications   Medication Sig Start Date End Date Taking? Authorizing Provider  Ascorbic Acid (VITAMIN C) 1000 MG tablet Take 2,000 mg by mouth daily.     [provider]  Coenzyme Q10 (CO Q 10) 100 MG CAPS Take 100 mg by mouth daily.     [provider]  doxycycline (DORYX) 100 MG EC tablet Take 1 tablet (100 mg total) by mouth 2 (two) times daily. 09/10/19   Tanner, Lyndon Code., PA-C  metoprolol tartrate (LOPRESSOR) 25 MG tablet Take one tablet in the AM and 2 tablets in the PM. 09/15/19   Leonie Man, MD  mupirocin ointment (BACTROBAN) 2 % Apply to effected area 4 times daily until healed. 09/10/19   Tanner,  Lyndon Code., PA-C  Probiotic Product (PROBIOTIC DAILY PO) Take 1 capsule by mouth daily.     [provider]  prochlorperazine (COMPAZINE) 10 MG tablet Take 1 tablet (10 mg total) by mouth every 6 (six) hours as needed for nausea or vomiting. Patient not taking: Reported on 09/16/2019 05/29/19   Curt Bears, MD  Specialty Vitamins Products (PROSTATE PO) Take 1 capsule by mouth daily. PROSTATE FORMULA    [provider]  Vitamin D-Vitamin K (D3 + K2 DOTS PO) Take 1 tablet by mouth daily.    [provider]  zolpidem (AMBIEN) 5 MG tablet TAKE 1-2 TABLET BY MOUTH EVERY NIGHT AT BEDTIME AS NEEDED FOR SLEEP Patient not taking: Reported on 09/16/2019 08/31/19   Leonie Man, MD  metoprolol succinate (TOPROL-XL) 25 MG 24 hr tablet Take 1 tablet (25 mg total) by mouth daily. 09/05/19 09/06/19  Leonie Man, MD    Allergies    A-cillin [ampicillin], Amoxicillin, Other, Penicillins, and Sulfa antibiotics  Review of Systems   Review of Systems   Constitutional: Negative for chills and fever.  Respiratory: Negative for shortness of breath.   Cardiovascular: Positive for palpitations. Negative for chest pain.  Gastrointestinal: Negative for abdominal pain, nausea and vomiting.  Neurological: Positive for light-headedness.  All other systems reviewed and are negative.   Physical Exam Updated Vital Signs BP 104/89   Pulse 84   Temp 98.3 F (36.8 C) (Oral)   Resp 20   Ht 6' (1.829 m)   Wt 80.1 kg   SpO2 97%   BMI 23.95 kg/m   Physical Exam Vitals and nursing note reviewed.  Constitutional:      Appearance: He is well-developed. He is not toxic-appearing or diaphoretic.  HENT:     Head: Normocephalic and atraumatic.     Mouth/Throat:     Mouth: Mucous membranes are moist.     Pharynx: Oropharynx is clear.  Eyes:     General: No scleral icterus.    Conjunctiva/sclera: Conjunctivae normal.  Cardiovascular:     Rate and Rhythm: Normal rate and regular rhythm.     Pulses: Normal pulses.     Heart sounds: No murmur.  Pulmonary:     Effort: Pulmonary effort is normal. No respiratory distress.     Breath sounds: Normal breath sounds.  Abdominal:     Palpations: Abdomen is soft.     Tenderness: There is no abdominal tenderness.  Musculoskeletal:     Cervical back: Neck supple.  Skin:    General: Skin is warm and dry.  Neurological:     General: No focal deficit present.     Mental Status: He is alert and oriented to person, place, and time.  Psychiatric:        Mood and Affect: Mood normal.        Behavior: Behavior normal.     ED Results / Procedures / Treatments   Labs (all labs ordered are listed, but only abnormal results are displayed) Labs Reviewed  CBC WITH DIFFERENTIAL/PLATELET - Abnormal; Notable for the following components:      Result Value   RBC 3.71 (*)    Hemoglobin 11.9 (*)    HCT 36.5 (*)    Lymphs Abs 0.6 (*)    All other components within normal limits  BASIC METABOLIC PANEL -  Abnormal; Notable for the following components:   CO2 21 (*)    Glucose, Bld 103 (*)    All other components within normal limits  EKG None  Sinus rhythm, normal axis and intervals, no ST or T wave changes concerning for ischemia or infarction  Radiology No results found.  Procedures Procedures (including critical care time)  Medications Ordered in ED Medications - No data to display  ED Course  I have reviewed the triage vital signs and the nursing notes.  Pertinent labs & imaging results that were available during my care of the patient were reviewed by me and considered in my medical decision making (see chart for details).    MDM Rules/Calculators/A&P                      64 year old male with past medical history of SVT, coronary disease, lung cancer who presents for SVT.  Has had recurrent episodes recently, being followed by cardiology, rhythm aborted prior to arrival by 6 mg of adenosine given by EMS.  EKG with no emergent changes, no evidence of ischemia or dangerous arrhythmia.  BMP without emergent electrolyte changes, and no emergent changes on his CBC.  Patient is back to his baseline, this is a chronic condition and the patient will follow up with his cardiologist.  Strict return precautions given, all questions answered and patient agrees with plan.  Discharged in stable condition.   Final Clinical Impression(s) / ED Diagnoses Final diagnoses:  SVT (supraventricular tachycardia) [facility])    Rx / DC Orders ED Discharge Orders    None       Tamlyn Sides, [place], MD 09/25/19 2243    Lucrezia Starch, MD 09/26/19 [id number]

## 2019-09-26 ENCOUNTER — Telehealth: Payer: Self-pay | Admitting: Cardiology

## 2019-09-26 DIAGNOSIS — I471 Supraventricular tachycardia, unspecified: Secondary | ICD-10-CM

## 2019-09-26 NOTE — Telephone Encounter (Signed)
Spoke with Dr Ellyn Hack - okay to refer to EP   For possible ablation   patient is aware.

## 2019-09-26 NOTE — Telephone Encounter (Signed)
Spoke with patient . Patient states he eager to speak with a EP doctor since he has had another  Episode of SVT.  Patient states he Dr Ellyn Hack had talked about it at last office appointment. RN informed patient will contact Dr Ellyn Hack and obtain an referral  order for patient

## 2019-09-26 NOTE — Telephone Encounter (Signed)
Patient calling stating he was in the ED last night for a SVT episode last night. He would like to speak with Dr. Ellyn Hack today about whether he can be seen and to discuss getting a referral to an EP doctor. Please advise.

## 2019-09-30 ENCOUNTER — Telehealth: Payer: Self-pay | Admitting: Cardiology

## 2019-09-30 ENCOUNTER — Encounter: Payer: Self-pay | Admitting: Cardiology

## 2019-09-30 ENCOUNTER — Other Ambulatory Visit: Payer: Self-pay

## 2019-09-30 ENCOUNTER — Ambulatory Visit (INDEPENDENT_AMBULATORY_CARE_PROVIDER_SITE_OTHER): Payer: Federal, State, Local not specified - PPO | Admitting: Cardiology

## 2019-09-30 VITALS — BP 118/78 | HR 74 | Ht 76.0 in | Wt 178.2 lb

## 2019-09-30 DIAGNOSIS — I471 Supraventricular tachycardia: Secondary | ICD-10-CM | POA: Diagnosis not present

## 2019-09-30 MED ORDER — DILTIAZEM HCL ER COATED BEADS 180 MG PO CP24: 180.0000 mg | ORAL_CAPSULE | Freq: Every day | ORAL | 3 refills | Status: DC

## 2019-09-30 NOTE — Telephone Encounter (Signed)
Spoke with [facility]. They are at patient's home and patient was in SVT when they arrived. Patient was converted to SR with adenosine. Patient refused transport to hospital. Patient currently in sinus rhythm at 96 bpm, BP 124/80. Patient reports he was started on diltiazem today by Dr. Curt Bears as well.   Will route to MD and RN for review.

## 2019-09-30 NOTE — Telephone Encounter (Signed)
Patient states that following his appointment on today, 09/30/19 he has experienced another supraventricular tachycardia event. Please return call to discuss.

## 2019-09-30 NOTE — Progress Notes (Signed)
Electrophysiology Office Note   Date:  09/30/2019   ID:  Alan [name], DOB 1956/02/19, MRN [id number]  PCP:  Aurea Graff.[name], MD  Cardiologist:  Ellyn Hack Primary Electrophysiologist:  Saranya Harlin Meredith Leeds, MD    Chief Complaint: SVT   History of Present Illness: Alan [name] is a 64 y.o. male who is being seen today for the evaluation of SVT at the request of Leonie Man, MD. Presenting today for electrophysiology evaluation.  He has a history of coronary artery disease status post PCI, MVR for MVP and severe mitral regurgitation.  He was diagnosed with lung cancer in June 2019 and is now status post chemo and XRT.  He was in the hospital 09/06/2019 with palpitations and lightheadedness.  He was walking his dog at the time.  His heart rate was 190 bpm, but got as fast as 215 bpm.  He was given 18 mg of adenosine which converted him to sinus rhythm.  He was switched to Toprol-XL 50 mg.  Today, he denies symptoms of palpitations, chest pain, shortness of breath, orthopnea, PND, lower extremity edema, claudication, dizziness, presyncope, syncope, bleeding, or neurologic sequela. The patient is tolerating medications without difficulties.    Past Medical History:  Diagnosis Date  . CAD S/P percutaneous coronary angioplasty 2004   which showed essentially normal LV size and function, moderately dilated left atrium, moderate mitral prolapse with mild to moderate regurgitation.  . Dyslipidemia, goal LDL below 70   . Essential hypertension   . GERD (gastroesophageal reflux disease)   . History of Mitral valve prolapse    Moderate - with moderate MR, noted February t 2013  . History of Severe mitral regurgitation by prior echocardiogram 02/06/2014   TEE: Severe mitral regurgitation with a flail P2 segment and ruptured; Normal LV size & function, dilated LA.  Marland Kitchen History of stress test 12/2009   he walked 9 mins reaching 10 METS. There was an attenuation artifact in the inferior region  but no ischemia or infaract, low risk.  . Incidental lung nodule, greater than or equal to 28mm 03/16/2014   Ground glass opacity RML noted on CT scan  . lung ca dx'd 03/2019  . PONV (postoperative nausea and vomiting)   . 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 ST elevation myocardial infarction (STEMI) of anterior wall, subsequent episode of care [facility]) 2004   he had a proximal LAD occlusion treated with 2 overlapping 3.5 x 1.8 mm Cypher DES stents.   Past Surgical History:  Procedure Laterality Date  . ANTERIOR CRUCIATE LIGAMENT REPAIR Left 1993  . CARDIAC CATHETERIZATION  2004   he had a proximal LAD occlusion treated with 2 overlapping 3.5 x 1.8 mm Cypher DES stents.  Marland Kitchen CARDIAC CATHETERIZATION  2007; 03/2014   Widely patent LAD stents, 40% distal LAD, 30% Cx, ~50% PL-PDA bifurcation lesion   . COLONOSCOPY    . INTRAOPERATIVE TRANSESOPHAGEAL ECHOCARDIOGRAM N/A 04/08/2014   Procedure: INTRAOPERATIVE TRANSESOPHAGEAL ECHOCARDIOGRAM;  Surgeon: Rexene Alberts, MD;  Location: [place];  Service: Open Heart Surgery;  Laterality: N/A;  . LEFT AND RIGHT HEART CATHETERIZATION WITH CORONARY ANGIOGRAM N/A 03/05/2014   Procedure: LEFT AND RIGHT HEART CATHETERIZATION WITH CORONARY ANGIOGRAM;  Surgeon: Leonie Man, MD;  Location: [facility] CATH LAB;  Service: Cardiovascular;  Laterality: N/A;  . LEFT HEART CATHETERIZATION WITH CORONARY ANGIOGRAM N/A 09/15/2011   Procedure: LEFT HEART CATHETERIZATION WITH CORONARY ANGIOGRAM;  Surgeon: Lorretta Harp, MD;  Location: [facility] CATH LAB;  Service: Cardiovascular;  Laterality: N/A;  . MITRAL VALVE REPAIR Right 04/08/2014   Procedure: MINIMALLY INVASIVE MITRAL VALVE REPAIR (MVR);  Surgeon: Rexene Alberts, MD;  Location: [place];  Service: Open Heart Surgery;  Laterality: Right;  . NM MYOVIEW LTD  May 2011   Walk 9 minutes, and 10 METs, diaphragmatic attenuation but no ischemia or infarction.  . TEE WITHOUT CARDIOVERSION N/A 02/06/2014   Procedure: TRANSESOPHAGEAL ECHOCARDIOGRAM (TEE);  Surgeon: Pixie Casino, MD;  Normal LV Size U& function - EF 55-60%, no regional WMA.  MV P2 Leaflet is flail with ruptured chord with severe prolapse, anterior leaflet intact.  Severe, eccentric anterior directed MR with dilated LA.  Marland Kitchen TRANSTHORACIC ECHOCARDIOGRAM  07/18/2018   Normal LV size and function.  EF 50-60 %.  Unable to assess diastolic function.  Mitral valve sewing ring in place.  Mild stenosis with a gradient of 6 mmHg noted. -  Domingo Dimes ECHOCARDIOGRAM  June 30 2015   Low normal LV function with EF 50-55%. GR 1 DD. No MR. Normal gradient of 4 mmHg the mitral valve. No prolapse.  Marland Kitchen VIDEO BRONCHOSCOPY WITH ENDOBRONCHIAL ULTRASOUND N/A 05/16/2019   Procedure: VIDEO BRONCHOSCOPY WITH ENDOBRONCHIAL ULTRASOUND;  Surgeon: Marshell Garfinkel, MD;  Location: [place];  Service: Pulmonary;  Laterality: N/A;     Current Outpatient Medications  Medication Sig Dispense Refill  . Ascorbic Acid (VITAMIN C) 1000 MG tablet Take 2,000 mg by mouth daily.     . Coenzyme Q10 (CO Q 10) 100 MG CAPS Take 100 mg by mouth daily.     . metoprolol tartrate (LOPRESSOR) 25 MG tablet Take one tablet in the AM and 2 tablets in the PM. 270 tablet 3  . prochlorperazine (COMPAZINE) 10 MG tablet Take 1 tablet (10 mg total) by mouth every 6 (six) hours as needed for nausea or vomiting. 30 tablet 0  . Specialty Vitamins Products (PROSTATE PO) Take 1 capsule by mouth daily. PROSTATE FORMULA    . Vitamin D-Vitamin K (D3 + K2 DOTS PO) Take 1 tablet by mouth daily.    Marland Kitchen zolpidem (AMBIEN) 5 MG tablet TAKE 1-2 TABLET BY MOUTH EVERY NIGHT AT BEDTIME AS NEEDED FOR SLEEP 120 tablet 2  . diltiazem (CARDIZEM CD) 180 MG 24 hr capsule Take  1 capsule (180 mg total) by mouth daily. 30 capsule 3   No current facility-administered medications for this visit.    Allergies:   A-cillin [ampicillin], Amoxicillin, Other, Penicillins, and Sulfa antibiotics   Social History:  The patient  reports that he has never smoked. He has never used smokeless tobacco. He reports current alcohol use of about 10.0 standard drinks of alcohol per week. He reports that he does not use drugs.   Family History:  The patient's family history includes Cancer in his paternal grandmother; Heart Problems in his father; Hypertension in his mother.    ROS:  Please see the history of present illness.   Otherwise, review of systems is positive for none.   All other systems are reviewed and negative.    PHYSICAL EXAM: VS:  BP 118/78   Pulse 74   Ht 6\' 4"  (1.93 m)   Wt 178 lb 3.2 oz (80.8 kg)   SpO2 97%   BMI 21.69 kg/m  ,  BMI Body mass index is 21.69 kg/m. GEN: Well nourished, well developed, in no acute distress  HEENT: normal  Neck: no JVD, carotid bruits, or masses Cardiac: RRR; no murmurs, rubs, or gallops,no edema  Respiratory:  clear to auscultation bilaterally, normal work of breathing GI: soft, nontender, nondistended, + BS MS: no deformity or atrophy  Skin: warm and dry Neuro:  Strength and sensation are intact Psych: euthymic mood, full affect  EKG:  EKG is not ordered today. Personal review of the ekg ordered 09/26/19 shows sinus rhythm, rate 90  Recent Labs: 09/06/2019: ALT 25; B Natriuretic Peptide 52.0; Magnesium 1.9; TSH 1.114 09/25/2019: BUN 14; Creatinine, Ser 0.78; Hemoglobin 11.9; Platelets 231; Potassium 3.6; Sodium 140    Lipid Panel  No results found for: CHOL, TRIG, HDL, CHOLHDL, VLDL, LDLCALC, LDLDIRECT   Wt Readings from Last 3 Encounters:  09/30/19 178 lb 3.2 oz (80.8 kg)  09/25/19 176 lb 9.6 oz (80.1 kg)  09/16/19 176 lb 9.6 oz (80.1 kg)      Other studies Reviewed: Additional studies/ records that were  reviewed today include: TTE 09/02/19  Review of the above records today demonstrates:  1. Left ventricular ejection fraction, by visual estimation, is 40 to  45%. The left ventricle has mildly decreased function. There is no left  ventricular hypertrophy.  2. Left ventricular diastolic parameters are consistent with Grade I  diastolic dysfunction (impaired relaxation).  3. The left ventricle has no regional wall motion abnormalities.  4. Global right ventricle has normal systolic function.The right  ventricular size is normal. No increase in right ventricular wall  thickness.  5. Left atrial size was normal.  6. Right atrial size was normal.  7. The mitral valve has been repaired/replaced. Trivial mitral valve  regurgitation.  8. The tricuspid valve is grossly normal.  9. The tricuspid valve is grossly normal. Tricuspid valve regurgitation  is trivial.  10. The aortic valve is tricuspid. Aortic valve regurgitation is not  visualized.  11. The pulmonic valve was grossly normal. Pulmonic valve regurgitation is  not visualized.  12. Aortic dilatation noted.  13. 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  14. The atrial septum is grossly normal.  15. The average left ventricular global longitudinal strain is -20.2 %.    ASSESSMENT AND PLAN:  1.  SVT: Fortunately I do not have evidence of his ECGs at this time.  Despite that, he has had multiple hospitalizations with descriptions of a narrow complex tachycardia.  Due to that, we Massimiliano Rohleder plan to start him on diltiazem 180 mg a day.  He also would prefer to have ablation.  Risks and benefits of ablation were discussed which include bleeding, tamponade, heart block, stroke among others.  He understand these risks and is agreed to the procedure.  2.  Coronary artery disease: Status post PCI to the LAD for STEMI.  No current chest pain.  3.  Mitral valve prolapse: Status post mitral valve repair.   Echo shows stable mitral valve.  No changes.  4.  Hypertension: Currently well controlled  Case discussed with primary cardiology  Current medicines are reviewed at length with the patient today.   The patient does not have concerns regarding his medicines.  The following changes were made today:  none  Labs/ tests ordered today include:  No orders of the defined types were placed in this encounter.    Disposition:   FU with Harrison Paulson 3 months  Signed,  Winifred Balogh Meredith Leeds, MD  09/30/2019 9:18 AM     [facility] [address] [place] [address] [phone] (office) [phone] (fax)

## 2019-09-30 NOTE — Patient Instructions (Addendum)
Medication Instructions:  Your physician has recommended you make the following change in your medication:  1. START Diltiazem 180 mg once daily  *If you need a refill on your cardiac medications before your next appointment, please call your pharmacy*  Lab Work: None ordered If you have labs (blood work) drawn today and your tests are completely normal, you will receive your results only by: Marland Kitchen MyChart Message (if you have MyChart) OR . A paper copy in the mail If you have any lab test that is abnormal or we need to change your treatment, we will call you to review the results.  Testing/Procedures: Your physician has recommended that you have an ablation. Catheter ablation is a medical procedure used to treat some cardiac arrhythmias (irregular heartbeats). During catheter ablation, a long, thin, flexible tube is put into a blood vessel in your groin (upper thigh), or neck. This tube is called an ablation catheter. It is then guided to your heart through the blood vessel. Radio frequency waves destroy small areas of heart tissue where abnormal heartbeats may cause an arrhythmia to start. Please see the instruction below.   Follow-Up: At [facility], you and your health needs are our priority.  As part of our continuing mission to provide you with exceptional heart care, we have created designated Provider Care Teams.  These Care Teams include your primary Cardiologist (physician) and Advanced Practice Providers (APPs -  Physician Assistants and Nurse Practitioners) who all work together to provide you with the care you need, when you need it.  Your next appointment:   2 month(s)  The format for your next appointment:   In Person  Provider:   Allegra Lai, MD  Other Instructions    Electrophysiology/Ablation Procedure Instructions  You are scheduled for a(n)  ablation on 10/22/2019 with Dr. Allegra Lai.   1.   Pre procedure testing-          COVID TEST-- On 10/18/19 @ 10:00 am-  You will go to [facility] hospital ([place]) for your Covid testing.   This is a drive thru test site.  There will be multiple testing areas.  Be sure to share with the first checkpoint that you are there for pre-procedure/surgery testing. This will put you into the right (yellow) lane that leads to the PAT testing team. Stay in your car and the nurse team will come to your car to test you.  After you are tested please go home and self quarantine until the day of your procedure.     2. On the day of your procedure 10/22/2019 you will go to [facility] [phone]) at 11:30 am.  Dennis Bast will go to the main entrance A [employer]) and enter where the [employer] are.  Your driver will drop you off and you will head down the hallway to ADMITTING.  You may have one support person come in to the hospital with you.  They will be asked to wait in the waiting room.   3.   Do not eat or drink after midnight prior to your procedure.   4.   Do NOT take any medications the morning of your procedure.   5.  Plan for an overnight stay.  If you use your phone frequently bring your phone charger.   6. You will follow up with Dr. Curt Bears 4 weeks after your procedure.  This appointment will be made for you.   * If you  have ANY questions please call the office [phone] and ask for Mikaelyn Arthurs RN or send me a MyChart message   * Occasionally, EP Studies and ablations can become lengthy.  Please make your family aware of this before your procedure starts.  Average time ranges from 2-8 hours for EP studies/ablations.  Your physician will call your family after the procedure with the results.

## 2019-10-01 ENCOUNTER — Encounter: Payer: Self-pay | Admitting: Internal Medicine

## 2019-10-01 ENCOUNTER — Other Ambulatory Visit: Payer: Self-pay | Admitting: Internal Medicine

## 2019-10-01 ENCOUNTER — Inpatient Hospital Stay (HOSPITAL_BASED_OUTPATIENT_CLINIC_OR_DEPARTMENT_OTHER): Payer: Federal, State, Local not specified - PPO | Admitting: Internal Medicine

## 2019-10-01 ENCOUNTER — Inpatient Hospital Stay: Payer: Federal, State, Local not specified - PPO

## 2019-10-01 ENCOUNTER — Other Ambulatory Visit: Payer: Self-pay

## 2019-10-01 VITALS — BP 112/77 | HR 70 | Temp 97.9°F | Resp 18 | Ht 76.0 in | Wt 176.3 lb

## 2019-10-01 DIAGNOSIS — I471 Supraventricular tachycardia: Secondary | ICD-10-CM

## 2019-10-01 DIAGNOSIS — C3431 Malignant neoplasm of lower lobe, right bronchus or lung: Secondary | ICD-10-CM | POA: Diagnosis not present

## 2019-10-01 DIAGNOSIS — C3491 Malignant neoplasm of unspecified part of right bronchus or lung: Secondary | ICD-10-CM

## 2019-10-01 DIAGNOSIS — Z5112 Encounter for antineoplastic immunotherapy: Secondary | ICD-10-CM | POA: Diagnosis not present

## 2019-10-01 LAB — CBC WITH DIFFERENTIAL (CANCER CENTER ONLY)
Abs Immature Granulocytes: 0.03 10*3/uL (ref 0.00–0.07)
Basophils Absolute: 0 10*3/uL (ref 0.0–0.1)
Basophils Relative: 1 %
Eosinophils Absolute: 0.2 10*3/uL (ref 0.0–0.5)
Eosinophils Relative: 5 %
HCT: 38.6 % — ABNORMAL LOW (ref 39.0–52.0)
Hemoglobin: 12.8 g/dL — ABNORMAL LOW (ref 13.0–17.0)
Immature Granulocytes: 1 %
Lymphocytes Relative: 11 %
Lymphs Abs: 0.5 10*3/uL — ABNORMAL LOW (ref 0.7–4.0)
MCH: 32.6 pg (ref 26.0–34.0)
MCHC: 33.2 g/dL (ref 30.0–36.0)
MCV: 98.2 fL (ref 80.0–100.0)
Monocytes Absolute: 0.6 10*3/uL (ref 0.1–1.0)
Monocytes Relative: 12 %
Neutro Abs: 3.5 10*3/uL (ref 1.7–7.7)
Neutrophils Relative %: 70 %
Platelet Count: 207 10*3/uL (ref 150–400)
RBC: 3.93 MIL/uL — ABNORMAL LOW (ref 4.22–5.81)
RDW: 12.8 % (ref 11.5–15.5)
WBC Count: 4.9 10*3/uL (ref 4.0–10.5)
nRBC: 0 % (ref 0.0–0.2)

## 2019-10-01 LAB — CMP (CANCER CENTER ONLY)
ALT: 14 U/L (ref 0–44)
AST: 14 U/L — ABNORMAL LOW (ref 15–41)
Albumin: 3.6 g/dL (ref 3.5–5.0)
Alkaline Phosphatase: 80 U/L (ref 38–126)
Anion gap: 11 (ref 5–15)
BUN: 13 mg/dL (ref 8–23)
CO2: 23 mmol/L (ref 22–32)
Calcium: 9.3 mg/dL (ref 8.9–10.3)
Chloride: 108 mmol/L (ref 98–111)
Creatinine: 0.76 mg/dL (ref 0.61–1.24)
GFR, Est AFR Am: >60 mL/min (ref >60)
GFR, Estimated: >60 mL/min (ref >60)
Glucose, Bld: 111 mg/dL — ABNORMAL HIGH (ref 70–99)
Potassium: 4 mmol/L (ref 3.5–5.1)
Sodium: 142 mmol/L (ref 135–145)
Total Bilirubin: 0.7 mg/dL (ref 0.3–1.2)
Total Protein: 6.7 g/dL (ref 6.5–8.1)

## 2019-10-01 LAB — TSH: TSH: 1.55 u[IU]/mL (ref 0.320–4.118)

## 2019-10-01 MED ORDER — HEPARIN SOD (PORK) LOCK FLUSH 100 UNIT/ML IV SOLN
500.0000 [IU] | Freq: Once | INTRAVENOUS | Status: DC | PRN
  Filled 2019-10-01: qty 5

## 2019-10-01 MED ORDER — SODIUM CHLORIDE 0.9 % IV SOLN
1500.0000 mg | Freq: Once | INTRAVENOUS | Status: AC
  Administered 2019-10-01: 1500 mg via INTRAVENOUS
  Filled 2019-10-01: qty 30

## 2019-10-01 MED ORDER — SODIUM CHLORIDE 0.9 % IV SOLN
Freq: Once | INTRAVENOUS | Status: AC
  Filled 2019-10-01: qty 250

## 2019-10-01 MED ORDER — SODIUM CHLORIDE 0.9% FLUSH
10.0000 mL | INTRAVENOUS | Status: DC | PRN
  Filled 2019-10-01: qty 10

## 2019-10-01 NOTE — Progress Notes (Signed)
[place] Telephone:([phone]) [phone]   Fax:(336) [phone]  OFFICE PROGRESS NOTE  [name], L.[name], [place] [address] [place] [address]  DIAGNOSIS: stage IIIb (T2b, N3, M0) non-small cell lung cancer, adenocarcinoma presented with right lower lobe lung nodule in addition to right hilar mass/node as well as ipsilateral and contralateral mediastinal lymphadenopathy diagnosed in October 2020.  Molecular studies by [employer] showed no actionable mutations.  PRIOR THERAPY: Concurrent chemoradiation with weekly carboplatin for AUC of 2 and paclitaxel 45 mg/M2.  Status post 7 cycles.  Last dose was given July 21, 2019  CURRENT THERAPY: Consolidation treatment with immunotherapy with Imfinzi 1500 mg.  Status post 1 cycle.  INTERVAL HISTORY: Alan [name] 64 y.o. male returns to the clinic today for follow-up visit.  The patient tolerated the first cycle of his treatment fairly well with no concerning adverse effects.  He had several episodes of SVT and he called EMT several times to his house in the last several weeks.  These episodes of SVT started before he received the first dose of his immunotherapy.  He was seen by electrophysiologist yesterday and expected to have procedure in March.  The patient indicated that he cannot wait that long with several episodes happening frequently.  He was very emotional dealing with his current condition.  He denied having any current chest pain, shortness of breath but has mild cough with no hemoptysis.  He denied having any fever or chills.  He has no nausea, vomiting, diarrhea or constipation.  He denied having any headache or visual changes.    MEDICAL HISTORY: Past Medical History:  Diagnosis Date  . CAD S/P percutaneous coronary angioplasty 2004   which showed essentially normal LV size and function, moderately dilated left atrium, moderate mitral prolapse with mild to moderate regurgitation.  . Dyslipidemia,  goal LDL below 70   . Essential hypertension   . GERD (gastroesophageal reflux disease)   . History of Mitral valve prolapse    Moderate - with moderate MR, noted February t 2013  . History of Severe mitral regurgitation by prior echocardiogram 02/06/2014   TEE: Severe mitral regurgitation with a flail P2 segment and ruptured; Normal LV size & function, dilated LA.  Marland Kitchen History of stress test 12/2009   he walked 9 mins reaching 10 METS. There was an attenuation artifact in the inferior region but no ischemia or infaract, low risk.  . Incidental lung nodule, greater than or equal to 47mm 03/16/2014   Ground glass opacity RML noted on CT scan  . lung ca dx'd 03/2019  . PONV (postoperative nausea and vomiting)   . 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 ST elevation myocardial infarction (STEMI) of anterior wall, subsequent episode of care [facility]) 2004   he had a proximal LAD occlusion treated with 2 overlapping 3.5 x 1.8 mm Cypher DES stents.    ALLERGIES:  is allergic to a-cillin [ampicillin]; amoxicillin; other; penicillins; and sulfa antibiotics.  MEDICATIONS:  Current Outpatient Medications  Medication Sig Dispense Refill  . Ascorbic Acid (VITAMIN C) 1000 MG tablet Take 2,000 mg by mouth daily.     . Coenzyme Q10 (CO Q 10) 100 MG CAPS Take 100 mg by mouth daily.     Marland Kitchen  diltiazem (CARDIZEM CD) 180 MG 24 hr capsule Take 1 capsule (180 mg total) by mouth daily. 30 capsule 3  . metoprolol tartrate (LOPRESSOR) 25 MG tablet Take one tablet in the AM and 2 tablets in the PM. 270 tablet 3  . prochlorperazine (COMPAZINE) 10 MG tablet Take 1 tablet (10 mg total) by mouth every 6 (six) hours as needed for nausea or vomiting. 30 tablet  0  . Specialty Vitamins Products (PROSTATE PO) Take 1 capsule by mouth daily. PROSTATE FORMULA    . Vitamin D-Vitamin K (D3 + K2 DOTS PO) Take 1 tablet by mouth daily.    Marland Kitchen zolpidem (AMBIEN) 5 MG tablet TAKE 1-2 TABLET BY MOUTH EVERY NIGHT AT BEDTIME AS NEEDED FOR SLEEP 120 tablet 2   No current facility-administered medications for this visit.    SURGICAL HISTORY:  Past Surgical History:  Procedure Laterality Date  . ANTERIOR CRUCIATE LIGAMENT REPAIR Left 1993  . CARDIAC CATHETERIZATION  2004   he had a proximal LAD occlusion treated with 2 overlapping 3.5 x 1.8 mm Cypher DES stents.  Marland Kitchen CARDIAC CATHETERIZATION  2007; 03/2014   Widely patent LAD stents, 40% distal LAD, 30% Cx, ~50% PL-PDA bifurcation lesion   . COLONOSCOPY    . INTRAOPERATIVE TRANSESOPHAGEAL ECHOCARDIOGRAM N/A 04/08/2014   Procedure: INTRAOPERATIVE TRANSESOPHAGEAL ECHOCARDIOGRAM;  Surgeon: Rexene Alberts, MD;  Location: [place];  Service: Open Heart Surgery;  Laterality: N/A;  . LEFT AND RIGHT HEART CATHETERIZATION WITH CORONARY ANGIOGRAM N/A 03/05/2014   Procedure: LEFT AND RIGHT HEART CATHETERIZATION WITH CORONARY ANGIOGRAM;  Surgeon: Leonie Man, MD;  Location: [facility] C[facility] LAB;  Service: Cardiovascular;  Laterality: N/A;  . LEFT HEART CATHETERIZATION WITH CORONARY ANGIOGRAM N/A 09/15/2011   Procedure: LEFT HEART CATHETERIZATION WITH CORONARY ANGIOGRAM;  Surgeon: Lorretta Harp, MD;  Location: [facility] CATH LAB;  Service: Cardiovascular;  Laterality: N/A;  . MITRAL VALVE REPAIR Right 04/08/2014   Procedure: MINIMALLY INVASIVE MITRAL VALVE REPAIR (MVR);  Surgeon: Rexene Alberts, MD;  Location: [place];  Service: Open Heart Surgery;  Laterality: Right;  . NM MYOVIEW LTD  May 2011   Walk 9 minutes, and 10 METs, diaphragmatic attenuation but no ischemia or infarction.  . TEE WITHOUT CARDIOVERSION N/A 02/06/2014   Procedure: TRANSESOPHAGEAL ECHOCARDIOGRAM (TEE);  Surgeon: Pixie Casino, MD;  Normal LV Size U& function - EF 55-60%, no  regional WMA.  MV P2 Leaflet is flail with ruptured chord with severe prolapse, anterior leaflet intact.  Severe, eccentric anterior directed MR with dilated LA.  Marland Kitchen TRANSTHORACIC ECHOCARDIOGRAM  07/18/2018   Normal LV size and function.  EF 50-60 %.  Unable to assess diastolic function.  Mitral valve sewing ring in place.  Mild stenosis with a gradient of 6 mmHg noted. -  Domingo Dimes ECHOCARDIOGRAM  June 30 2015   Low normal LV function with EF 50-55%. GR 1 DD. No MR. Normal gradient of 4 mmHg the mitral valve. No prolapse.  Marland Kitchen VIDEO BRONCHOSCOPY WITH ENDOBRONCHIAL ULTRASOUND N/A 05/16/2019   Procedure: VIDEO BRONCHOSCOPY WITH ENDOBRONCHIAL ULTRASOUND;  Surgeon: Marshell Garfinkel, MD;  Location: [place];  Service: Pulmonary;  Laterality: N/A;    REVIEW OF SYSTEMS:  Constitutional: positive for fatigue Eyes: negative Ears, nose, mouth, throat, and face: negative Respiratory: positive for cough and dyspnea on exertion Cardiovascular: negative Gastrointestinal: positive for dyspepsia Genitourinary:negative Integument/breast: negative Hematologic/lymphatic: negative Musculoskeletal:negative Neurological: negative Behavioral/Psych: negative Endocrine: negative Allergic/Immunologic: negative   PHYSICAL EXAMINATION: General appearance: alert, cooperative, fatigued and no distress Head: Normocephalic,  without obvious abnormality, atraumatic Neck: no adenopathy, no JVD, supple, symmetrical, trachea midline and thyroid not enlarged, symmetric, no tenderness/mass/nodules Lymph nodes: Cervical, supraclavicular, and axillary nodes normal. Resp: rales RUL Back: symmetric, no curvature. ROM normal. No CVA tenderness. Cardio: regular rate and rhythm, S1, S2 normal, no murmur, click, rub or gallop GI: soft, non-tender; bowel sounds normal; no masses,  no organomegaly Extremities: extremities normal, atraumatic, no cyanosis or edema Neurologic: Alert and oriented X 3, normal strength and tone.  Normal symmetric reflexes. Normal coordination and gait  ECOG PERFORMANCE STATUS: 1 - Symptomatic but completely ambulatory  Blood pressure 112/77, pulse 70, temperature 97.9 F (36.6 C), temperature source Temporal, resp. rate 18, height 6\' 4"  (1.93 m), weight 176 lb 4.8 oz (80 kg), SpO2 99 %.  LABORATORY DATA: Lab Results  Component Value Date   WBC 4.9 10/01/2019   HGB 12.8 (L) 10/01/2019   HCT 38.6 (L) 10/01/2019   MCV 98.2 10/01/2019   PLT 207 10/01/2019      Chemistry      Component Value Date/Time   NA 140 09/25/2019 1915   K 3.6 09/25/2019 1915   CL 107 09/25/2019 1915   CO2 21 (L) 09/25/2019 1915   BUN 14 09/25/2019 1915   CREATININE 0.78 09/25/2019 1915   CREATININE 0.77 09/04/2019 1426   CREATININE 0.80 02/27/2014 1152      Component Value Date/Time   CALCIUM 9.1 09/25/2019 1915   ALKPHOS 75 09/06/2019 1130   AST 30 09/06/2019 1130   AST 12 (L) 09/04/2019 1426   ALT 25 09/06/2019 1130   ALT 14 09/04/2019 1426   BILITOT 0.9 09/06/2019 1130   BILITOT 0.5 09/04/2019 1426       RADIOGRAPHIC STUDIES: ECHOCARDIOGRAM COMPLETE  Result Date: 09/02/2019   ECHOCARDIOGRAM REPORT   Patient Name:   JED KUTCH Date of Exam: 09/02/2019 Medical Rec #:  [id number]        Height:       76.0 in Accession #:    [id number]       Weight:       176.4 lb Date of Birth:  July 08, 1956         BSA:          2.10 m Patient Age:    30 years         BP:           109/73 mmHg Patient Gender: M                HR:           84 bpm. Exam Location:  [place] Procedure: 2D Echo, 3D Echo, Cardiac Doppler, Color Doppler and Strain Analysis Indications:    I25.1 CAD  History:        Patient has prior history of Echocardiogram examinations, most                 recent 07/18/2018. CAD and Previous Myocardial Infarction, Mitral                 Valve Prolapse, Arrythmias:SVT; Risk Factors:Hypertension and                 Dyslipidemia. Mitral Valve: annuloplasty ring repair valve is                  present in the mitral position. Mitral valve repair.  Sonographer:    [place] Referring Phys: [place]  1. Left  ventricular ejection fraction, by visual estimation, is 40 to 45%. The left ventricle has mildly decreased function. There is no left ventricular hypertrophy.  2. Left ventricular diastolic parameters are consistent with Grade I diastolic dysfunction (impaired relaxation).  3. The left ventricle has no regional wall motion abnormalities.  4. Global right ventricle has normal systolic function.The right ventricular size is normal. No increase in right ventricular wall thickness.  5. Left atrial size was normal.  6. Right atrial size was normal.  7. The mitral valve has been repaired/replaced. Trivial mitral valve regurgitation.  8. The tricuspid valve is grossly normal.  9. The tricuspid valve is grossly normal. Tricuspid valve regurgitation is trivial. 10. The aortic valve is tricuspid. Aortic valve regurgitation is not visualized. 11. The pulmonic valve was grossly normal. Pulmonic valve regurgitation is not visualized. 12. Aortic dilatation noted. 13. 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 14. The atrial septum is grossly normal. 15. The average left ventricular global longitudinal strain is -20.2 %. In comparison to the previous echocardiogram(s): 07/18/18 EF 50-55%. MV 65mmHg. FINDINGS  Left Ventricle: Left ventricular ejection fraction, by visual estimation, is 40 to 45%. The left ventricle has mildly decreased function. The average left ventricular global longitudinal strain is -20.2 %. The left ventricle has no regional wall motion abnormalities. There is no left ventricular hypertrophy. Left ventricular diastolic parameters are consistent with Grade I diastolic dysfunction (impaired relaxation). Right Ventricle: The right ventricular size is normal. No increase in right ventricular wall thickness. Global RV systolic function  is has normal systolic function. Left Atrium: Left atrial size was normal in size. Right Atrium: Right atrial size was normal in size Pericardium: There is no evidence of pericardial effusion. Mitral Valve: The mitral valve has been repaired/replaced. Trivial mitral valve regurgitation. MV peak gradient, 11.8 mmHg. Tricuspid Valve: The tricuspid valve is grossly normal. Tricuspid valve regurgitation is trivial. Aortic Valve: The aortic valve is tricuspid. Aortic valve regurgitation is not visualized. Pulmonic Valve: The pulmonic valve was grossly normal. Pulmonic valve regurgitation is not visualized. Pulmonic regurgitation is not visualized. Aorta: Aortic dilatation noted. 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 IAS/Shunts: The atrial septum is grossly normal.  LEFT VENTRICLE PLAX 2D LVIDd:         4.20 cm  Diastology LVIDs:         3.15 cm  LV e' lateral:   6.83 cm/s LV PW:         1.00 cm  LV E/e' lateral: 14.1 LV IVS:        0.90 cm  LV e' medial:    5.03 cm/s LVOT diam:     2.20 cm  LV E/e' medial:  19.1 LV SV:         39 ml LV SV Index:   18.98    2D Longitudinal Strain LVOT Area:     3.80 cm 2D Strain GLS (A2C):   -20.6 %                         2D Strain GLS (A3C):   -20.7 %                         2D Strain GLS (A4C):   -19.4 %  2D Strain GLS Avg:     -20.2 %                          3D Volume EF:                         3D EF:        55 %                         LV EDV:       127 ml                         LV ESV:       58 ml                         LV SV:        70 ml RIGHT VENTRICLE RV Basal diam:  3.50 cm RV S prime:     11.30 cm/s TAPSE (M-mode): 2.3 cm LEFT ATRIUM             Index       RIGHT ATRIUM           Index LA diam:        3.90 cm 1.86 cm/m  RA Pressure: 3.00 mmHg LA Vol (A2C):   63.9 ml 30.43 ml/m RA Area:     12.90 cm LA Vol (A4C):   41.1 ml 19.57 ml/m RA Volume:   29.60 ml  14.10 ml/m LA Biplane Vol: 53.7 ml 25.57 ml/m   AORTIC VALVE LVOT Vmax:   81.30 cm/s LVOT Vmean:  52.800 cm/s LVOT VTI:    0.138 m  AORTA Ao Root diam: 4.00 cm Ao Asc diam:  3.80 cm MITRAL VALVE                         TRICUSPID VALVE MV Area (PHT): 4.31 cm              Estimated RAP:  3.00 mmHg MV Peak grad:  11.8 mmHg MV Mean grad:  5.0 mmHg              SHUNTS MV Vmax:       1.72 m/s              Systemic VTI:  0.14 m MV Vmean:      97.1 cm/s             Systemic Diam: 2.20 cm MV VTI:        0.35 m MV PHT:        51.04 msec MV Decel Time: 176 msec MV E velocity: 96.10 cm/s  103 cm/s MV A velocity: 152.00 cm/s 70.3 cm/s MV E/A ratio:  0.63        1.5  Mertie Moores MD Electronically signed by Mertie Moores MD Signature Date/Time: 09/02/2019/2:43:16 PM    Final     ASSESSMENT AND PLAN: This is a very pleasant 64 years old white male recently diagnosed with a stage IIIb non-small cell lung cancer, adenocarcinoma with no actionable mutation.  He completed a course of concurrent chemoradiation with weekly carboplatin and paclitaxel status post 7 cycles.   His treatment was complicated with dysphagia, odynophagia secondary to radiation induced esophagitis.  He also has persistent cough and shortness  of breath secondary to radiation-induced pneumonitis. He has partial response after this treatment. The patient is currently undergoing treatment with consolidation immunotherapy with Imfinzi 1500 mg IV every 4 weeks is status post 1 cycle.  He tolerated the first cycle of his treatment well. I recommended for him to proceed with cycle #2 today as planned. Regarding the frequent episodes of SVT, he has many of these episodes before starting immunotherapy and I am not sure if Imfinzi is a contributing factor to these episodes at this point but cannot be completely excluded. He will continue with his cardiologist for evaluation of this condition.  He is currently on metoprolol and diltiazem. I will see the patient back for follow-up visit in 4 weeks for  evaluation before starting cycle #3. The patient was advised to call immediately if he has any concerning symptoms in the interval. The patient voices understanding of current disease status and treatment options and is in agreement with the current care plan. All questions were answered. The patient knows to call the clinic with any problems, questions or concerns. We can certainly see the patient much sooner if necessary.  The total time spent in the appointment was 35 minutes.  Disclaimer: This note was dictated with voice recognition software. Similar sounding words can inadvertently be transcribed and may not be corrected upon review.

## 2019-10-01 NOTE — Telephone Encounter (Signed)
Patient called back returning Sherri's call

## 2019-10-02 ENCOUNTER — Telehealth: Payer: Self-pay | Admitting: Cardiology

## 2019-10-02 NOTE — Telephone Encounter (Signed)
Pt reports he is not doing great About 2 hours after consult on Tuesday he went back to SVT and EMS was called, he did not go to the hospital. (he had not started the Diltiazem yet)  Pt very stressed and is asking if anyway possible to move procedure sooner.  He is exhausted from the repetitive episodes. Aware that I am awaiting EMS to fax over records as I cannot access them for some reason (I spoke with [facility] shift supervisor yesterday about this).  Pt is going to try and scan strips that he has at home and send today for Dr. Curt Bears to review. Will forward to Dr. Curt Bears for consideration of adding pt to hospital schedule next Wednesday. Pt aware that I will call him back today once reviewed w/ Dr. Curt Bears.

## 2019-10-02 NOTE — Telephone Encounter (Signed)
Patient called to make you aware he sent a email through my chart with 4 attachments.

## 2019-10-02 NOTE — Telephone Encounter (Signed)
Spoke to pt. Informed to continue monitoring and let medication try and help control things until current procedure date in March.  Pt does report feeling some improvement since starting, he has taken 3 doses thus far.   Pt advised to call office first thing Monday morning if continued issues over the weekend. If so we will move procedure date up to next Wednesday and have him screened Monday morning. Pt agreeable He does request moving date to 3/5 even if does well over weekend. Aware we will wait to see how he does and agreed to speak beginning of next week before re-scheduling procedure. Patient verbalized understanding and agreeable to plan.

## 2019-10-02 NOTE — Telephone Encounter (Signed)
Left detailed message with plan. Asked to call back, but informed I would attempt to call again today to verbally review Dr. Curt Bears recommendation.

## 2019-10-06 NOTE — Telephone Encounter (Signed)
Pt going to continue to monitor for now and let me know if need Diltiazem increase (Dr. Curt Bears approved increase).   Aware procedure date being moved up to next Friday and that I would be in touch this week to go over updated/new instructions. Patient verbalized understanding and agreeable to plan.

## 2019-10-10 ENCOUNTER — Telehealth: Payer: Self-pay | Admitting: Cardiology

## 2019-10-10 NOTE — Telephone Encounter (Signed)
Patient calling stating he is scheduled for his ablation 3/10, but was told it was going to be moved up to 3/5. He would like a call back to find out if it will be rescheduled.

## 2019-10-10 NOTE — Telephone Encounter (Signed)
Ablation rescheduled for 3/5. Covid screening rescheduled for 3/3. Pt aware to arrive to [facility] at 8:30 on 10/17/19 for SVT Ablation.  All other previous instructions r/t ablation remain the same.  Patient verbalized understanding and agreeable to plan.

## 2019-10-15 ENCOUNTER — Other Ambulatory Visit (HOSPITAL_COMMUNITY)
Admission: RE | Admit: 2019-10-15 | Discharge: 2019-10-15 | Disposition: A | Discharge status: Home / Self Care | Payer: Federal, State, Local not specified - PPO | Source: Ambulatory Visit | Attending: Cardiology | Admitting: Cardiology

## 2019-10-15 DIAGNOSIS — Z20822 Contact with and (suspected) exposure to covid-19: Secondary | ICD-10-CM | POA: Diagnosis not present

## 2019-10-15 DIAGNOSIS — Z01812 Encounter for preprocedural laboratory examination: Secondary | ICD-10-CM | POA: Diagnosis present

## 2019-10-15 LAB — SARS CORONAVIRUS 2 (TAT 6-24 HRS): SARS Coronavirus 2: NEGATIVE

## 2019-10-16 NOTE — Progress Notes (Signed)
Instructed patient on the following items: Arrival time 0830 Nothing to eat or drink after midnight No meds AM of procedure Responsible person to drive you home and stay with you for 24 hrs

## 2019-10-17 ENCOUNTER — Other Ambulatory Visit: Payer: Self-pay

## 2019-10-17 ENCOUNTER — Ambulatory Visit (HOSPITAL_COMMUNITY)
Admission: RE | Disposition: A | Discharge status: Home / Self Care | Payer: Self-pay | Source: Ambulatory Visit | Attending: Cardiology

## 2019-10-17 ENCOUNTER — Ambulatory Visit (HOSPITAL_COMMUNITY)
Admission: RE | Admit: 2019-10-17 | Discharge: 2019-10-17 | Disposition: A | Discharge status: Home / Self Care | Payer: Federal, State, Local not specified - PPO | Source: Ambulatory Visit | Attending: Cardiology | Admitting: Cardiology

## 2019-10-17 DIAGNOSIS — I340 Nonrheumatic mitral (valve) insufficiency: Secondary | ICD-10-CM | POA: Insufficient documentation

## 2019-10-17 DIAGNOSIS — I252 Old myocardial infarction: Secondary | ICD-10-CM | POA: Insufficient documentation

## 2019-10-17 DIAGNOSIS — Z8249 Family history of ischemic heart disease and other diseases of the circulatory system: Secondary | ICD-10-CM | POA: Diagnosis not present

## 2019-10-17 DIAGNOSIS — Z880 Allergy status to penicillin: Secondary | ICD-10-CM | POA: Insufficient documentation

## 2019-10-17 DIAGNOSIS — I10 Essential (primary) hypertension: Secondary | ICD-10-CM | POA: Insufficient documentation

## 2019-10-17 DIAGNOSIS — I2510 Atherosclerotic heart disease of native coronary artery without angina pectoris: Secondary | ICD-10-CM | POA: Insufficient documentation

## 2019-10-17 DIAGNOSIS — Z882 Allergy status to sulfonamides status: Secondary | ICD-10-CM | POA: Insufficient documentation

## 2019-10-17 DIAGNOSIS — Z955 Presence of coronary angioplasty implant and graft: Secondary | ICD-10-CM | POA: Insufficient documentation

## 2019-10-17 DIAGNOSIS — Z952 Presence of prosthetic heart valve: Secondary | ICD-10-CM | POA: Insufficient documentation

## 2019-10-17 DIAGNOSIS — I471 Supraventricular tachycardia: Secondary | ICD-10-CM | POA: Insufficient documentation

## 2019-10-17 DIAGNOSIS — K219 Gastro-esophageal reflux disease without esophagitis: Secondary | ICD-10-CM | POA: Diagnosis not present

## 2019-10-17 DIAGNOSIS — E785 Hyperlipidemia, unspecified: Secondary | ICD-10-CM | POA: Insufficient documentation

## 2019-10-17 DIAGNOSIS — Z79899 Other long term (current) drug therapy: Secondary | ICD-10-CM | POA: Diagnosis not present

## 2019-10-17 HISTORY — PX: SVT ABLATION: EP1225

## 2019-10-17 SURGERY — SVT ABLATION

## 2019-10-17 MED ORDER — HEPARIN (PORCINE) IN NACL 1000-0.9 UT/500ML-% IV SOLN
INTRAVENOUS | Status: AC
  Filled 2019-10-17: qty 500

## 2019-10-17 MED ORDER — BUPIVACAINE HCL (PF) 0.25 % IJ SOLN
INTRAMUSCULAR | Status: DC | PRN
  Administered 2019-10-17: 40 mL

## 2019-10-17 MED ORDER — VERAPAMIL HCL 2.5 MG/ML IV SOLN
INTRAVENOUS | Status: AC
  Filled 2019-10-17: qty 2

## 2019-10-17 MED ORDER — SODIUM CHLORIDE 0.9% FLUSH: 3.0000 mL | Freq: Two times a day (BID) | INTRAVENOUS | Status: DC

## 2019-10-17 MED ORDER — ONDANSETRON HCL 4 MG/2ML IJ SOLN: 4.0000 mg | Freq: Four times a day (QID) | INTRAMUSCULAR | Status: DC | PRN

## 2019-10-17 MED ORDER — FENTANYL CITRATE (PF) 100 MCG/2ML IJ SOLN
INTRAMUSCULAR | Status: DC | PRN
  Administered 2019-10-17 (×4): 25 ug via INTRAVENOUS

## 2019-10-17 MED ORDER — SODIUM CHLORIDE 0.9% FLUSH: 3.0000 mL | INTRAVENOUS | Status: DC | PRN

## 2019-10-17 MED ORDER — SODIUM CHLORIDE 0.9 % IV SOLN: 250.0000 mL | INTRAVENOUS | Status: DC | PRN

## 2019-10-17 MED ORDER — BUPIVACAINE HCL (PF) 0.25 % IJ SOLN
INTRAMUSCULAR | Status: AC
  Filled 2019-10-17: qty 60

## 2019-10-17 MED ORDER — ISOPROTERENOL HCL 0.2 MG/ML IJ SOLN
INTRAMUSCULAR | Status: AC
  Filled 2019-10-17: qty 5

## 2019-10-17 MED ORDER — FENTANYL CITRATE (PF) 100 MCG/2ML IJ SOLN
INTRAMUSCULAR | Status: AC
  Filled 2019-10-17: qty 2

## 2019-10-17 MED ORDER — MIDAZOLAM HCL 5 MG/5ML IJ SOLN
INTRAMUSCULAR | Status: DC | PRN
  Administered 2019-10-17: 1 mg via INTRAVENOUS
  Administered 2019-10-17: 2 mg via INTRAVENOUS
  Administered 2019-10-17 (×2): 1 mg via INTRAVENOUS

## 2019-10-17 MED ORDER — MIDAZOLAM HCL 5 MG/5ML IJ SOLN
INTRAMUSCULAR | Status: AC
  Filled 2019-10-17: qty 5

## 2019-10-17 MED ORDER — ACETAMINOPHEN 325 MG PO TABS: 650.0000 mg | ORAL_TABLET | ORAL | Status: DC | PRN

## 2019-10-17 MED ORDER — SODIUM CHLORIDE 0.9 % IV SOLN: INTRAVENOUS | Status: DC

## 2019-10-17 MED ORDER — SODIUM CHLORIDE 0.9 % IV SOLN
INTRAVENOUS | Status: DC | PRN
  Administered 2019-10-17: 2 ug/min via INTRAVENOUS

## 2019-10-17 MED ORDER — BUPIVACAINE HCL (PF) 0.25 % IJ SOLN
INTRAMUSCULAR | Status: AC
  Filled 2019-10-17: qty 30

## 2019-10-17 SURGICAL SUPPLY — 12 items
CATH EZ STEER NAV 4MM D-F CUR (ABLATOR) ×3 IMPLANT
CATH JOSEPH QUAD ALLRED 6F REP (CATHETERS) ×6 IMPLANT
CATH WEBSTER BI DIR CS D-F CRV (CATHETERS) ×3 IMPLANT
DEVICE CLOSURE PERCLS PRGLD 6F (VASCULAR PRODUCTS) ×4 IMPLANT
PACK EP LATEX FREE (CUSTOM PROCEDURE TRAY) ×3
PACK EP LF (CUSTOM PROCEDURE TRAY) ×1 IMPLANT
PAD PRO RADIOLUCENT 2001M-C (PAD) ×3 IMPLANT
PATCH CARTO3 (PAD) ×3 IMPLANT
PERCLOSE PROGLIDE 6F (VASCULAR PRODUCTS) ×12
SHEATH PINNACLE 6F 10CM (SHEATH) ×6 IMPLANT
SHEATH PINNACLE 7F 10CM (SHEATH) ×3 IMPLANT
SHEATH PINNACLE 8F 10CM (SHEATH) ×3 IMPLANT

## 2019-10-17 NOTE — Progress Notes (Signed)
Discharge instructions reviewed with patient and wife. Verbalized understanding.

## 2019-10-17 NOTE — Discharge Instructions (Signed)
Cardiac Ablation, Care After  This sheet gives you information about how to care for yourself after your procedure. Your health care provider may also give you more specific instructions. If you have problems or questions, contact your health care provider. What can I expect after the procedure? After the procedure, it is common to have:  Bruising around your puncture site.  Tenderness around your puncture site.  Skipped heartbeats.  Tiredness (fatigue).  Follow these instructions at home: Puncture site care   Follow instructions from your health care provider about how to take care of your puncture site. Make sure you: ? If present, leave stitches (sutures), skin glue, or adhesive strips in place. These skin closures may need to stay in place for up to 2 weeks. If adhesive strip edges start to loosen and curl up, you may trim the loose edges. Do not remove adhesive strips completely unless your health care provider tells you to do that.  Check your puncture site every day for signs of infection. Check for: ? Redness, swelling, or pain. ? Fluid or blood. If your puncture site starts to bleed, lie down on your back, apply firm pressure to the area, and contact your health care provider. ? Warmth. ? Pus or a bad smell. Driving  Do not drive for at least 4 days after your procedure or however long your health care provider recommends. (Do not resume driving if you have previously been instructed not to drive for other health reasons.)  Do not drive or use heavy machinery while taking prescription pain medicine. Activity  Avoid activities that take a lot of effort for at least 7 days after your procedure.  Do not lift anything that is heavier than 5 lb (4.5 kg) for one week.   No sexual activity for 1 week.   Return to your normal activities as told by your health care provider. Ask your health care provider what activities are safe for you. General instructions  Take  over-the-counter and prescription medicines only as told by your health care provider.  Do not use any products that contain nicotine or tobacco, such as cigarettes and e-cigarettes. If you need help quitting, ask your health care provider.  You may shower after 24 hours, but Do not take baths, swim, or use a hot tub for 1 week.   Do not drink alcohol for 24 hours after your procedure.  Keep all follow-up visits as told by your health care provider. This is important. Contact a health care provider if:  You have redness, mild swelling, or pain around your puncture site.  You have fluid or blood coming from your puncture site that stops after applying firm pressure to the area.  Your puncture site feels warm to the touch.  You have pus or a bad smell coming from your puncture site.  You have a fever.  You have chest pain or discomfort that spreads to your neck, jaw, or arm.  You are sweating a lot.  You feel nauseous.  You have a fast or irregular heartbeat.  You have shortness of breath.  You are dizzy or light-headed and feel the need to lie down.  You have pain or numbness in the arm or leg closest to your puncture site. Get help right away if:  Your puncture site suddenly swells.  Your puncture site is bleeding and the bleeding does not stop after applying firm pressure to the area. These symptoms may represent a serious problem that is an emergency.  Do not wait to see if the symptoms will go away. Get medical help right away. Call your local emergency services (911 in the [place] Do not drive yourself to the hospital. Summary  After the procedure, it is normal to have bruising and tenderness at the puncture site in your groin, neck, or forearm.  Check your puncture site every day for signs of infection.  Get help right away if your puncture site is bleeding and the bleeding does not stop after applying firm pressure to the area. This is a medical emergency. This  information is not intended to replace advice given to you by your health care provider. Make sure you discuss any questions you have with your health care provider.   Post procedure care instructions No driving for 4 days. No lifting over 5 lbs for 1 week. No vigorous or sexual activity for 1 week. You may return to work/your usual activities on 10/24/2019. Keep procedure site clean & dry. If you notice increased pain, swelling, bleeding or pus, call/return!  You may shower, but no soaking baths/hot tubs/pools for 1 week.

## 2019-10-17 NOTE — H&P (Signed)
Alan [name] has presented today for surgery, with the diagnosis of SVT.  The various methods of treatment have been discussed with the patient and family. After consideration of risks, benefits and other options for treatment, the patient has consented to  Procedure(s): Catheter ablation as a surgical intervention .  Risks include but not limited to bleeding, tamponade, heart block, stroke, damage to surrounding organs, among others. The patient's history has been reviewed, patient examined, no change in status, stable for surgery.  I have reviewed the patient's chart and labs.  Questions were answered to the patient's satisfaction.    Angelus Hoopes Curt Bears, MD 10/17/2019 10:01 AM

## 2019-10-18 ENCOUNTER — Other Ambulatory Visit (HOSPITAL_COMMUNITY): Payer: Federal, State, Local not specified - PPO

## 2019-10-20 ENCOUNTER — Telehealth: Payer: Self-pay | Admitting: Cardiology

## 2019-10-20 MED FILL — Heparin Sod (Porcine)-NaCl IV Soln 1000 Unit/500ML-0.9%: INTRAVENOUS | Qty: 500 | Status: AC

## 2019-10-20 MED FILL — Fentanyl Citrate Preservative Free (PF) Inj 100 MCG/2ML: INTRAMUSCULAR | Qty: 2 | Status: AC

## 2019-10-20 MED FILL — Midazolam HCl Inj 5 MG/5ML (Base Equivalent): INTRAMUSCULAR | Qty: 5 | Status: AC

## 2019-10-20 MED FILL — Bupivacaine HCl Preservative Free (PF) Inj 0.25%: INTRAMUSCULAR | Qty: 30 | Status: AC

## 2019-10-20 NOTE — Telephone Encounter (Signed)
° °  Pt said he's having issues after the surgery and would like to speak with Dr. Curt Bears nurse.   Please call

## 2019-10-20 NOTE — Telephone Encounter (Signed)
Spoke with pt who reports having had ablation on Friday.  He reports he doesn't believe he has has any SVT during the weekend but he has continue to have "muscle spasms" in his lower abdomen, up into chest and into neck.  He describes the spasms as pretty much constant during the day all weekend.  They eased off last night and he was up about 1 hour this morning before they returned.  He doesn't know what is causing them and reports nothing helps to make them better.  He stated he thought the ablation would have help with this.  Advised I will send this information to Dr Curt Bears and his nurse for their knowledge and follow-up.

## 2019-10-20 NOTE — Telephone Encounter (Signed)
Returned call to pt for further clarification of reported concerns. Pt reports continued muscle spasms and trouble sleeping.  He reports that he was experiencing these spasms prior to procedure, but they seemed to have improved slightly.   He describes it as starting in his abdomen (belly button area) and moves upward into his chest.  Sometimes he feels chest pressure at same time.  Sometimes he can try and "cough it out", sometimes he "burps and then it stops".  He reports that burping stops it everytime.  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  His questions now is: "now that we have done the procedure, try to get an idea of what to expect going forward".  Educated on what to expect. Informed that I would further discuss this w/ Camnitz and let him know. Pt aware I am not sure that this is ablation/SVT related.

## 2019-10-22 NOTE — Telephone Encounter (Signed)
Follow up    Patient is calling to provide a better contact number for Sherri to call [phone].  He is requesting a call back in reference to Dr. Curt Bears response from his previous inquiry

## 2019-10-22 NOTE — Telephone Encounter (Addendum)
Advised to follow up w/ PCP, per Dr. Curt Bears. Pt agreeable and will also discuss w/ oncology next week.

## 2019-10-23 NOTE — Progress Notes (Signed)
Pharmacist Chemotherapy Monitoring - Follow Up Assessment    I verify that I have reviewed each item in the below checklist:  Regimen:  . Patient due for treatment based on regimen frequency. . Plan date matches the patient's scheduled date. . Assessed if patient has a supportive care therapy plans, and if patient is due for treatment.  Organ Function and Labs: . Appropriate labs and drug-specific monitoring are ordered prior to the patient's treatment.  Dose Assessment: . If applicable to patient's regimen, lifetime cumulative doses have been properly documented and assessed.  Toxicity Monitoring/Prevention: . Patient has appropriate take home medications prescribed.  . Medication allergies and previous infusion related reactions, if applicable, have been reviewed and addressed.  Order Review: . Treatment plan orders signed and/or patient is scheduled to see a provider prior to their treatment.  Britt Boozer 10/23/2019 8:53 AM

## 2019-10-28 NOTE — Progress Notes (Signed)
[facility] OFFICE PROGRESS NOTE  Alan [name], L.Alan [name], Alan [name] [address] [place] [address]  DIAGNOSIS: stage IIIb (T2b, N3, M0) non-small cell lung cancer, adenocarcinoma presented with right lower lobe lung nodule in addition to right hilar mass/node as well as ipsilateral and contralateral mediastinal lymphadenopathy diagnosed in October 2020.  Molecular studies by [facility] showed no actionable mutations.  PRIOR THERAPY: Concurrent chemoradiation with weekly carboplatin for AUC of 2 and paclitaxel 45 mg/M2.  Status post 7 cycles.  Last dose was given July 21, 2019  CURRENT THERAPY: Consolidation treatment with immunotherapy with Imfinzi 1500 mg.  Status post 2 cycles.  INTERVAL HISTORY: Alan [name] 64 y.o. male returns to the clinic for a follow up visit. The patient recently had an ablation for his SVT. He had SVT prior to starting immunotherapy. The patient is feeling fair today but expresses concerns today. First, the patient states that he has and "active stomach". He states when he is 3/4 of the way through with eating, he has a clenching/muscle spasm sensation in his stomach with associated abdominal pressure and elevation of his heart rate. His symptoms then resolve after belching. He is inquiring if he should start taking a PPI. He also mentions coughing fits that have been worsening over the last month. The patient stated these first started around the time he completed concurrent chemoradiation. He states that these coughing fits are provoked by deep breathing which causes chest discomfort and triggers a tickling sensation in his chest. This then causes him to cough 4-5 times in a row forcefully. He states this occurs about 2-3x per day. He has not tried any interventions to alleviate his symptoms. His cough is a dry cough.    Otherwise, his last treatment with immunotherapy went well. Denies any fever, chills, night sweats, or weight loss.  Denies any hemoptysis. He reports his baseline dyspnea on exertion. Denies any nausea, vomiting, diarrhea, or constipation. Denies any headache or visual changes. Denies any rashes or skin changes. The patient is here today for evaluation prior to starting cycle # 3.   MEDICAL HISTORY: Past Medical History:  Diagnosis Date  . CAD S/P percutaneous coronary angioplasty 2004   which showed essentially normal LV size and function, moderately dilated left atrium, moderate mitral prolapse with mild to moderate regurgitation.  . Dyslipidemia, goal LDL below 70   . Essential hypertension   . GERD (gastroesophageal reflux disease)   . History of Mitral valve prolapse    Moderate - with moderate MR, noted February t 2013  . History of Severe mitral regurgitation by prior echocardiogram 02/06/2014   TEE: Severe mitral regurgitation with a flail P2 segment and ruptured; Normal LV size & function, dilated LA.  Marland Kitchen History of stress test 12/2009   he walked 9 mins reaching 10 METS. There was an attenuation artifact in the inferior region but no ischemia or infaract, low risk.  . Incidental lung nodule, greater than or equal to 18mm 03/16/2014   Ground glass opacity RML noted on CT scan  . lung ca dx'd 03/2019  . PONV (postoperative nausea and vomiting)   . 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 ST elevation myocardial infarction (STEMI) of anterior wall, subsequent episode of care (  [facility]) 2004   he had a proximal LAD occlusion treated with 2 overlapping 3.5 x 1.8 mm Cypher DES stents.    ALLERGIES:  is allergic to a-cillin [ampicillin]; amoxicillin; other; penicillins; and sulfa antibiotics.  MEDICATIONS:  Current Outpatient  Medications  Medication Sig Dispense Refill  . Ascorbic Acid (VITAMIN C) 1000 MG tablet Take 2,000 mg by mouth daily.     . carbamide peroxide (DEBROX) 6.5 % OTIC solution Place 5 drops into both ears daily as needed (excessive ear wax).    . Coenzyme Q10 (CO Q 10) 100 MG CAPS Take 100 mg by mouth daily.     Marland Kitchen diltiazem (CARDIZEM CD) 180 MG 24 hr capsule Take 1 capsule (180 mg total) by mouth daily. (Patient taking differently: Take 180 mg by mouth daily with lunch. ) 30 capsule 3  . MAGNESIUM PO Take 1 tablet by mouth at bedtime.    . Melatonin 10 MG TABS Take 10 mg by mouth at bedtime.    . prochlorperazine (COMPAZINE) 10 MG tablet Take 1 tablet (10 mg total) by mouth every 6 (six) hours as needed for nausea or vomiting. 30 tablet 0  . Specialty Vitamins Products (PROSTATE PO) Take 1 capsule by mouth daily. PROSTATE FORMULA    . Vitamin D-Vitamin K (D3 + K2 DOTS PO) Take 1 tablet by mouth daily.    Marland Kitchen zolpidem (AMBIEN) 5 MG tablet TAKE 1-2 TABLET BY MOUTH EVERY NIGHT AT BEDTIME AS NEEDED FOR SLEEP (Patient not taking: Reported on 10/10/2019) 120 tablet 2   No current facility-administered medications for this visit.   Facility-Administered Medications Ordered in Other Visits  Medication Dose Route Frequency Provider Last Rate Last Admin  . 0.9 %  sodium chloride infusion   Intravenous Once Curt Bears, MD      . durvalumab [facility]) 1,500 mg in sodium chloride 0.9 % 100 mL chemo infusion  1,500 mg Intravenous Once Curt Bears, MD      . heparin lock flush 100 unit/mL  500 Units Intracatheter Once PRN Curt Bears, MD      . sodium chloride flush (NS) 0.9 % injection 10 mL  10 mL Intracatheter PRN Curt Bears, MD        SURGICAL HISTORY:  Past Surgical History:  Procedure Laterality Date  . ANTERIOR CRUCIATE LIGAMENT REPAIR Left 1993  . CARDIAC CATHETERIZATION  2004   he had a proximal LAD occlusion treated with 2 overlapping 3.5 x 1.8 mm Cypher DES stents.  Marland Kitchen CARDIAC  CATHETERIZATION  2007; 03/2014   Widely patent LAD stents, 40% distal LAD, 30% Cx, ~50% PL-PDA bifurcation lesion   . COLONOSCOPY    . INTRAOPERATIVE TRANSESOPHAGEAL ECHOCARDIOGRAM N/A 04/08/2014   Procedure: INTRAOPERATIVE TRANSESOPHAGEAL ECHOCARDIOGRAM;  Surgeon: Rexene Alberts, MD;  Location: [facility];  Service: Open Heart Surgery;  Laterality: N/A;  . LEFT AND RIGHT HEART CATHETERIZATION WITH CORONARY ANGIOGRAM N/A 03/05/2014   Procedure: LEFT AND RIGHT HEART CATHETERIZATION WITH CORONARY ANGIOGRAM;  Surgeon: Leonie Man, MD;  Location: [facility] CATH LAB;  Service: Cardiovascular;  Laterality: N/A;  . LEFT HEART CATHETERIZATION WITH CORONARY ANGIOGRAM N/A 09/15/2011   Procedure: LEFT HEART CATHETERIZATION WITH CORONARY ANGIOGRAM;  Surgeon: Lorretta Harp, MD;  Location: [facility] C[facility] LAB;  Service: Cardiovascular;  Laterality: N/A;  . MITRAL VALVE REPAIR Right 04/08/2014   Procedure: MINIMALLY INVASIVE MITRAL VALVE REPAIR (MVR);  Surgeon: Rexene Alberts, MD;  Location: [facility];  Service: Open Heart Surgery;  Laterality: Right;  . NM MYOVIEW LTD  May 2011   Walk 9 minutes, and 10 METs, diaphragmatic attenuation but no ischemia or infarction.  . SVT ABLATION N/A 10/17/2019   Procedure: SVT ABLATION;  Surgeon: Constance Haw, MD;  Location: [facility] CV LAB;  Service: Cardiovascular;  Laterality: N/A;  . TEE WITHOUT CARDIOVERSION N/A 02/06/2014   Procedure: TRANSESOPHAGEAL ECHOCARDIOGRAM (TEE);  Surgeon: Pixie Casino, MD;  Normal LV Size U& function - EF 55-60%, no regional WMA.  MV P2 Leaflet is flail with ruptured chord with severe prolapse, anterior leaflet intact.  Severe, eccentric anterior directed MR with dilated LA.  Marland Kitchen TRANSTHORACIC ECHOCARDIOGRAM  07/18/2018   Normal LV size and function.  EF 50-60 %.  Unable to assess diastolic function.  Mitral valve sewing ring in place.  Mild stenosis with a gradient of 6 mmHg noted. -  Domingo Dimes ECHOCARDIOGRAM  June 30 2015   Low normal LV  function with EF 50-55%. GR 1 DD. No MR. Normal gradient of 4 mmHg the mitral valve. No prolapse.  Marland Kitchen VIDEO BRONCHOSCOPY WITH ENDOBRONCHIAL ULTRASOUND N/A 05/16/2019   Procedure: VIDEO BRONCHOSCOPY WITH ENDOBRONCHIAL ULTRASOUND;  Surgeon: Marshell Garfinkel, MD;  Location: [facility];  Service: Pulmonary;  Laterality: N/A;    REVIEW OF SYSTEMS:   Review of Systems  Constitutional: Negative for appetite change, chills, fatigue, fever and unexpected weight change.  HENT: Negative for mouth sores, nosebleeds, sore throat and trouble swallowing.   Eyes: Negative for eye problems and icterus.  Respiratory: Positive for coughing fits 2-3x per day. Positive for baseline dyspnea on exertion. Negative for hemoptysis and wheezing.  Cardiovascular: Negative for chest pain and leg swelling.  Gastrointestinal: Positive for belching. Negative for abdominal pain, constipation, diarrhea, nausea and vomiting.  Genitourinary: Negative for bladder incontinence, difficulty urinating, dysuria, frequency and hematuria.   Musculoskeletal: Negative for back pain, gait problem, neck pain and neck stiffness.  Skin: Negative for itching and rash.  Neurological: Negative for dizziness, extremity weakness, gait problem, headaches, light-headedness and seizures.  Hematological: Negative for adenopathy. Does not bruise/bleed easily.  Psychiatric/Behavioral: Negative for confusion, depression and sleep disturbance. The patient is not nervous/anxious.     PHYSICAL EXAMINATION:  Blood pressure 132/84, pulse (!) 104, temperature 98.3 F (36.8 C), temperature source Oral, resp. rate 20, height 6\' 4"  (1.93 m), weight 178 lb 9.6 oz (81 kg), SpO2 98 %.  ECOG PERFORMANCE STATUS: 1 - Symptomatic but completely ambulatory  Physical Exam  Constitutional: Oriented to person, place, and time and well-developed, well-nourished, and in no distress.  HENT:  Head: Normocephalic and atraumatic.  Mouth/Throat: Oropharynx is clear and moist. No  oropharyngeal exudate.  Eyes: Conjunctivae are normal. Right eye exhibits no discharge. Left eye exhibits no discharge. No scleral icterus.  Neck: Normal range of motion. Neck supple.  Cardiovascular: Normal rate, regular rhythm, normal heart sounds and intact distal pulses.   Pulmonary/Chest: Effort normal and breath sounds normal. No respiratory distress. No wheezes. No rales.  Abdominal: Soft. Bowel sounds are normal. Exhibits no distension and no mass. There is no tenderness.  Musculoskeletal: Normal range of motion. Exhibits no edema.  Lymphadenopathy:    No cervical adenopathy.  Neurological: Alert and oriented to person, place, and time. Exhibits normal muscle tone. Gait normal. Coordination normal.  Skin: Skin is warm and dry. No rash noted. Not diaphoretic. No erythema. No pallor.  Psychiatric: Mood, memory and judgment normal.  Vitals reviewed.  LABORATORY DATA: Lab Results  Component Value Date   WBC 4.8 10/29/2019   HGB 13.9  10/29/2019   HCT 41.3 10/29/2019   MCV 93.4 10/29/2019   PLT 201 10/29/2019      Chemistry      Component Value Date/Time   NA 142 10/29/2019 0902   K 4.0 10/29/2019 0902   CL 107 10/29/2019 0902   CO2 23 10/29/2019 0902   BUN 14 10/29/2019 0902   CREATININE 0.76 10/29/2019 0902   CREATININE 0.80 02/27/2014 1152      Component Value Date/Time   CALCIUM 9.6 10/29/2019 0902   ALKPHOS 91 10/29/2019 0902   AST 16 10/29/2019 0902   ALT 14 10/29/2019 0902   BILITOT 0.5 10/29/2019 0902       RADIOGRAPHIC STUDIES:  EP STUDY  Addendum Date: 10/24/2019   SURGEON: Allegra Lai, MD PREPROCEDURE DIAGNOSIS: SVT POSTPROCEDURE DIAGNOSIS: Classic AV nodal reentrant tachycardia PROCEDURES: 1. Comprehensive EP study. 2. Coronary sinus pacing and recording. 3. Mapping of supraventricular tachycardia. 4. Radiofrequency ablation of supraventricular tachycardia. 5. Arrhythmia induction with isuprel infused INTRODUCTION: Alan [name] is a 64 y.o. male  with a history of symptomatic recurrent short RP SVT who presents today for EP study and radiofrequency ablation. The patient has had recurrent symptomatic SVT. She has failed medical therapy with verapamil. She now presents for EP study and radiofrequency ablation of SVT. DESCRIPTION OF PROCEDURE: Informed written consent was obtained and the patient was brought to the Electrophysiology Lab in the fasting state. The patient was adequately sedated with intravenous medication as outlined in the anesthesia report. The patient's right neck and groin was prepped and draped in the usual sterile fashion by the EP Lab staff. Using a percutaneous Seldinger technique, one 8 [place] and one 6 [place] hemostasis sheath was placed in the right femoral vein, one 7 [place] and one 6 [place] hemostasis sheath was placed in the left femoral vein.  Two 6-French quadripolar Josephson catheters were introduced through the right and left common femoral vein and advanced into the His bundle and right ventricular apex positions for recording and pacing.  A Biosense Webster auto ID CS catheter was placed in the coronary sinus through the left femoral vein.  Direct ultrasound guidance is used for right and left femoral veins with normal vessel patency. Ultrasound images are captured and stored in the patient's chart. Using ultrasound guidance, the Brockenbrough needle and wire were visualized entering the vessel. Presenting Measurements: The patient presented to the Electrophysiology Lab in sinus rhythm. The PR interval was 176 msec with a QRS of 109 msec and a Qt of 470 msec. The average RR interval was 885 msec. The AH interval was 107 msec and the HV interval was 51 milliseconds. EP study: Ventricular pacing was performed which reveals midline concentric decremental VA conduction with a single retrograde jump but no echo beats of tachycardias. The VA WCL was 520 msec and a VERP 600/477msec. Rapid atrial pacing was performed with reveals PR  >> RR with tachycardia not induced at 370 msec. AEST was performed which revealed multiple prolonged AH jumps with echo beats. Tachycardia was not induced. The AVNERP was 500/<=259msec. Isuprel was infused at 48mcg/min with an adequate acceleration in HR response observed. PR remained >> RR. Tachycardia was not induced. AEST was performed which again revealed multiple AH jumps. At 500/245msec the patient had an AH jump followed by sustained narrow complex SVT. The tachycardia cycle length was 366msec. The surface EKG was consistent with the patient's clinical tachycardia. VA time during tachycardia measured 30msec with earliest retrograde atrial activation recorded from the His  electrogram. The tachycardia terminated spontaneously with an atrial activation. This was a reproducible event. V pacing was performed during tachycardia which revealed a VAV response. PVCs on the His resulted in no change in atrial activation The patient was therefore felt to have classic AV nodal reentrant tachycardia. I therefore elected to perform slow pathway modification. Isuprel was discontinued and allowed to washout. Ablation: A 24F Biosense Webster DF 12mm ablation catheter was therefore advanced through the right femoral vein and advanced into the right atrium. 3 dimensional mapping of Koch's triangle was performed which revealed a standard sized triangle. A single lesion was delivered at site 9 in Koch's triangle with a target temperature of 60 degrees of 50 watts for 60 seconds. Good accelerated junctional rhythm was observed with intact VA conduction. A second RF lesion was applied at site 8 in Koch's triangle. Accelerated junctional rhythm was again observed however RF was discontinued after 24 seconds due to a single dropped retrograde beat. Measurements following ablation: Following ablation, Rapid atrial pacing was again performed with PR<RR and an AV WCL of 350 msec. AEST was performed which revealed a no echo beats and no  tachycardia observed. The AVN ERP was 500/280 msec. V pacing was performed which revealed midline concentric decremental VA conduction with a single retrograde jump but no echo beats of tachycardias.Isuprel was again infused at 4 mcg/min with an adequate acceleration in heart rate response. RAP revealed PR<RR with an AVWCL of 350 msec. AEST was performed which revealed no AH jumps, echo beats, or tachycardias induced. No arrhythmias were induced. Following ablation the AH interval was 92 msec with an HV interval of 60 msec. The procedure was therefore considered completed. All catheters were removed and the sheaths were aspirated and flushed. The sheaths were removed and hemostasis was assured. EBL<75ml. There were no early apparent complications. CONCLUSIONS: 1. Sinus rhythm upon presentation. 2. 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 Successful radiofrequency modification of the slow AV nodal pathway 4. No inducible arrhythmias following ablation. 5. No early apparent complications.   Result Date: 10/17/2019 SURGEON: Allegra Lai, MD PREPROCEDURE DIAGNOSIS: SVT POSTPROCEDURE DIAGNOSIS: Classic AV nodal reentrant tachycardia PROCEDURES: 1. Comprehensive EP study. 2. Coronary sinus pacing and recording. 3. Mapping of supraventricular tachycardia. 4. Radiofrequency ablation of supraventricular tachycardia. 5. Arrhythmia induction with isuprel infused INTRODUCTION: Alan [name] is a 64 y.o. male with a history of symptomatic recurrent short RP SVT who presents today for EP study and radiofrequency ablation. The patient has had recurrent symptomatic SVT. She has failed medical therapy with verapamil. She now presents for EP study and radiofrequency ablation of SVT. DESCRIPTION OF PROCEDURE: Informed written consent was obtained and the patient was brought to the Electrophysiology Lab in  the fasting state. The patient was adequately sedated with intravenous medication as outlined in the anesthesia report. The patient's right neck and groin was prepped and draped in the usual sterile fashion by the EP Lab staff. Using a percutaneous Seldinger technique, one 8 [place] and one 6 [place] hemostasis sheath was placed in the right femoral vein, one 7 [place] and one 6 [place] hemostasis sheath was placed in the left femoral vein.  Two 6-French quadripolar Josephson catheters were introduced through the right and left common femoral vein and advanced into the His bundle and right ventricular apex positions for recording and pacing.  A Biosense Webster auto ID CS catheter was placed  in the coronary sinus through the left femoral vein.  Direct ultrasound guidance is used for right and left femoral veins with normal vessel patency. Ultrasound images are captured and stored in the patient's chart. Using ultrasound guidance, the Brockenbrough needle and wire were visualized entering the vessel. Presenting Measurements: The patient presented to the Electrophysiology Lab in sinus rhythm. The PR interval was 176 msec with a QRS of 109 msec and a Qt of 470 msec. The average RR interval was 885 msec. The AH interval was 107 msec and the HV interval was 51 milliseconds. EP study: Ventricular pacing was performed which reveals midline concentric decremental VA conduction with a single retrograde jump but no echo beats of tachycardias. The VA WCL was 520 msec and a VERP 600/440msec. Rapid atrial pacing was performed with reveals PR >> RR with tachycardia not induced at 370 msec. AEST was performed which revealed multiple prolonged AH jumps with echo beats. Tachycardia was not induced. The AVNERP was 500/<=215msec. Isuprel was infused at 37mcg/min with an adequate acceleration in HR response observed. PR remained >> RR. Tachycardia was not induced. AEST was performed which again revealed multiple AH jumps. At 500/296msec the  patient had an AH jump followed by sustained narrow complex SVT. The tachycardia cycle length was 324msec. The surface EKG was consistent with the patient's clinical tachycardia. VA time during tachycardia measured 77msec with earliest retrograde atrial activation recorded from the His electrogram. The tachycardia terminated spontaneously with an atrial activation. This was a reproducible event. V pacing was performed during tachycardia which revealed a VAV response. PVCs on the His resulted in no change in atrial activation The patient was therefore felt to have classic AV nodal reentrant tachycardia. I therefore elected to perform slow pathway modification. Isuprel was discontinued and allowed to washout. Ablation: A 68F Biosense Webster DF 35mm ablation catheter was therefore advanced through the right femoral vein and advanced into the right atrium. Mapping of Koch's triangle was performed which revealed a standard sized triangle. A single lesion was delivered at site 9 in Koch's triangle with a target temperature of 60 degrees of 50 watts for 60 seconds. Good accelerated junctional rhythm was observed with intact VA conduction. A second RF lesion was applied at site 8 in Koch's triangle. Accelerated junctional rhythm was again observed however RF was discontinued after 24 seconds due to a single dropped retrograde beat. Measurements following ablation: Following ablation, Rapid atrial pacing was again performed with PR<RR and an AV WCL of 350 msec. AEST was performed which revealed a no echo beats and no tachycardia observed. The AVN ERP was 500/280 msec. V pacing was performed which revealed midline concentric decremental VA conduction with a single retrograde jump but no echo beats of tachycardias.Isuprel was again infused at 4 mcg/min with an adequate acceleration in heart rate response. RAP revealed PR<RR with an AVWCL of 350 msec. AEST was performed which revealed no AH jumps, echo beats, or tachycardias  induced. No arrhythmias were induced. Following ablation the AH interval was 92 msec with an HV interval of 60 msec. The procedure was therefore considered completed. All catheters were removed and the sheaths were aspirated and flushed. The sheaths were removed and hemostasis was assured. EBL<23ml. There were no early apparent complications. CONCLUSIONS: 1. Sinus rhythm upon presentation. 2. 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 Successful radiofrequency modification of the slow AV nodal pathway 4.  No inducible arrhythmias following ablation. 5. No early apparent complications.     ASSESSMENT/PLAN:  This is a very pleasant 64 year old Caucasian male diagnosed with stage IIIb non-small cell lung cancer, adenocarcinoma. He presented with a rightlower lobe lung mass and right hilar lymphadenopathy and ipsilateral and contralateral mediastinal lymphadenopathy. He has no actionable mutations. He was diagnosed in October 2020.  He is status post 7 cycles of concurrent chemoradiation with carboplatin for an AUC of 2 and paclitaxel 45 mg/m2. He experienced odynophagia, dysphagia, and radiation induced esophagitis. He was placed on a prednisone taperand is almost finished with his course.He is feeling better at this time.   Patient is currently undergoing treatment with Imfinzi 1500 mg IV every 4 weeks. S/p 2 cycles. The patient was seen with Dr. Julien Nordmann with recommendation to continue with same treatment. He will proceed with cycle #3 today as scheduled.   I will arrange for a restaging CT scan prior to starting cycle #4.   We will follow up in 4 weeks for evaluation and to review his scan before cycle #4.  Regarding his SVT, he will continue to follow with cardiology.   The patient was encouraged to take a PPI such a Prilosec. He is going to try to purchase OTC but  will call if he wants it sent via a prescription.   Regarding this coughing, we will evaluate his lung on his upcoming CT scan. He was advised to use cough medication to suppress his cough such as delsym.    The patient was advised to call immediately if she has any concerning symptoms in the interval. The patient voices understanding of current disease status and treatment options and is in agreement with the current care plan. All questions were answered. The patient knows to call the clinic with any problems, questions or concerns. We can certainly see the patient much sooner if necessary     Orders Placed This Encounter  Procedures  . CT Chest W Contrast    Standing Status:   Future    Standing Expiration Date:   10/28/2020    Order Specific Question:   ** REASON FOR EXAM (FREE TEXT)    Answer:   Restaging Lung Cancer    Order Specific Question:   If indicated for the ordered procedure, I authorize the administration of contrast media per Radiology protocol    Answer:   Yes    Order Specific Question:   Preferred imaging location?    Answer:   [facility]    Order Specific Question:   Radiology Contrast Protocol - do NOT remove file path    Answer:   [email or web address]     Londa Mackowski L Roneshia Drew, PA-C 10/29/19  ADDENDUM: Hematology/Oncology Attending: I had a face-to-face encounter with the patient today.  I recommended his care plan.  This is a very pleasant 64 years old white male with history of a stage IIIb non-small cell lung cancer, adenocarcinoma status post a course of concurrent chemoradiation with weekly carboplatin and paclitaxel.  He tolerated his treatment well except for radiation pneumonitis.  The patient is started treatment with consolidation immunotherapy with Imfinzi status post 2 cycles.  He has been tolerating his treatment with Imfinzi well except for the persistent episodes of cough initiated when he takes a deep breath.   He also has GERD symptoms improved with PPI. I recommended for the patient to proceed with cycle #3 today of the consolidation immunotherapy as planned. We will see him back for follow-up  visit in 4 weeks for evaluation with repeat CT scan of the chest for restaging of his disease. For the SVT the patient underwent ablation by cardiology and he is feeling much better. He was advised to call immediately if he has any other concerning symptoms in the interval.  Disclaimer: This note was dictated with voice recognition software. Similar sounding words can inadvertently be transcribed and may be missed upon review. Eilleen Kempf, MD 10/29/19

## 2019-10-29 ENCOUNTER — Other Ambulatory Visit: Payer: Self-pay

## 2019-10-29 ENCOUNTER — Inpatient Hospital Stay: Payer: Federal, State, Local not specified - PPO | Attending: Internal Medicine | Admitting: Physician Assistant

## 2019-10-29 ENCOUNTER — Inpatient Hospital Stay: Payer: Federal, State, Local not specified - PPO

## 2019-10-29 ENCOUNTER — Encounter: Payer: Self-pay | Admitting: Physician Assistant

## 2019-10-29 VITALS — BP 112/88 | HR 93 | Temp 97.8°F

## 2019-10-29 VITALS — BP 132/84 | HR 104 | Temp 98.3°F | Resp 20 | Ht 76.0 in | Wt 178.6 lb

## 2019-10-29 DIAGNOSIS — C3491 Malignant neoplasm of unspecified part of right bronchus or lung: Secondary | ICD-10-CM

## 2019-10-29 DIAGNOSIS — C3431 Malignant neoplasm of lower lobe, right bronchus or lung: Secondary | ICD-10-CM | POA: Insufficient documentation

## 2019-10-29 DIAGNOSIS — K219 Gastro-esophageal reflux disease without esophagitis: Secondary | ICD-10-CM | POA: Insufficient documentation

## 2019-10-29 DIAGNOSIS — Z5112 Encounter for antineoplastic immunotherapy: Secondary | ICD-10-CM | POA: Diagnosis not present

## 2019-10-29 DIAGNOSIS — Z79899 Other long term (current) drug therapy: Secondary | ICD-10-CM | POA: Insufficient documentation

## 2019-10-29 LAB — CMP (CANCER CENTER ONLY)
ALT: 14 U/L (ref 0–44)
AST: 16 U/L (ref 15–41)
Albumin: 3.9 g/dL (ref 3.5–5.0)
Alkaline Phosphatase: 91 U/L (ref 38–126)
Anion gap: 12 (ref 5–15)
BUN: 14 mg/dL (ref 8–23)
CO2: 23 mmol/L (ref 22–32)
Calcium: 9.6 mg/dL (ref 8.9–10.3)
Chloride: 107 mmol/L (ref 98–111)
Creatinine: 0.76 mg/dL (ref 0.61–1.24)
GFR, Est AFR Am: >60 mL/min (ref >60)
GFR, Estimated: >60 mL/min (ref >60)
Glucose, Bld: 110 mg/dL — ABNORMAL HIGH (ref 70–99)
Potassium: 4 mmol/L (ref 3.5–5.1)
Sodium: 142 mmol/L (ref 135–145)
Total Bilirubin: 0.5 mg/dL (ref 0.3–1.2)
Total Protein: 6.7 g/dL (ref 6.5–8.1)

## 2019-10-29 LAB — CBC WITH DIFFERENTIAL (CANCER CENTER ONLY)
Abs Immature Granulocytes: 0.01 10*3/uL (ref 0.00–0.07)
Basophils Absolute: 0 10*3/uL (ref 0.0–0.1)
Basophils Relative: 1 %
Eosinophils Absolute: 0.3 10*3/uL (ref 0.0–0.5)
Eosinophils Relative: 6 %
HCT: 41.3 % (ref 39.0–52.0)
Hemoglobin: 13.9 g/dL (ref 13.0–17.0)
Immature Granulocytes: 0 %
Lymphocytes Relative: 11 %
Lymphs Abs: 0.5 10*3/uL — ABNORMAL LOW (ref 0.7–4.0)
MCH: 31.4 pg (ref 26.0–34.0)
MCHC: 33.7 g/dL (ref 30.0–36.0)
MCV: 93.4 fL (ref 80.0–100.0)
Monocytes Absolute: 0.5 10*3/uL (ref 0.1–1.0)
Monocytes Relative: 10 %
Neutro Abs: 3.5 10*3/uL (ref 1.7–7.7)
Neutrophils Relative %: 72 %
Platelet Count: 201 10*3/uL (ref 150–400)
RBC: 4.42 MIL/uL (ref 4.22–5.81)
RDW: 12.4 % (ref 11.5–15.5)
WBC Count: 4.8 10*3/uL (ref 4.0–10.5)
nRBC: 0 % (ref 0.0–0.2)

## 2019-10-29 LAB — TSH: TSH: 1.163 u[IU]/mL (ref 0.320–4.118)

## 2019-10-29 MED ORDER — SODIUM CHLORIDE 0.9 % IV SOLN
Freq: Once | INTRAVENOUS | Status: AC
  Filled 2019-10-29: qty 250

## 2019-10-29 MED ORDER — HEPARIN SOD (PORK) LOCK FLUSH 100 UNIT/ML IV SOLN
500.0000 [IU] | Freq: Once | INTRAVENOUS | Status: DC | PRN
  Filled 2019-10-29: qty 5

## 2019-10-29 MED ORDER — SODIUM CHLORIDE 0.9% FLUSH
10.0000 mL | INTRAVENOUS | Status: DC | PRN
  Filled 2019-10-29: qty 10

## 2019-10-29 MED ORDER — SODIUM CHLORIDE 0.9 % IV SOLN
1500.0000 mg | Freq: Once | INTRAVENOUS | Status: AC
  Administered 2019-10-29: 11:00:00 1500 mg via INTRAVENOUS
  Filled 2019-10-29: qty 30

## 2019-10-29 NOTE — Patient Instructions (Signed)
[facility] Discharge Instructions for Patients Receiving Chemotherapy  Today you received the following chemotherapy agents: durvalumab.  To help prevent nausea and vomiting after your treatment, we encourage you to take your nausea medication as prescribed.   If you develop nausea and vomiting that is not controlled by your nausea medication, call the clinic.   BELOW ARE SYMPTOMS THAT SHOULD BE REPORTED IMMEDIATELY:  *FEVER GREATER THAN 100.5 F  *CHILLS WITH OR WITHOUT FEVER  NAUSEA AND VOMITING THAT IS NOT CONTROLLED WITH YOUR NAUSEA MEDICATION  *UNUSUAL SHORTNESS OF BREATH  *UNUSUAL BRUISING OR BLEEDING  TENDERNESS IN MOUTH AND THROAT WITH OR WITHOUT PRESENCE OF ULCERS  *URINARY PROBLEMS  *BOWEL PROBLEMS  UNUSUAL RASH Items with * indicate a potential emergency and should be followed up as soon as possible.  Feel free to call the clinic should you have any questions or concerns. The clinic phone number is [phone].  Please show the [facility] at check-in to the Emergency Department and triage nurse.

## 2019-11-03 ENCOUNTER — Telehealth: Payer: Self-pay | Admitting: Medical Oncology

## 2019-11-03 NOTE — Telephone Encounter (Signed)
I told pt wife  that pt does not need labs the same day as scan . He had labs on 3/17 and as long as they are within 6 weeks of scan he does not need labs.

## 2019-11-21 NOTE — Progress Notes (Signed)
Pharmacist Chemotherapy Monitoring - Follow Up Assessment    I verify that I have reviewed each item in the below checklist:  . Regimen for the patient is scheduled for the appropriate day and plan matches scheduled date. Marland Kitchen Appropriate non-routine labs are ordered dependent on drug ordered. . If applicable, additional medications reviewed and ordered per protocol based on lifetime cumulative doses and/or treatment regimen.   Plan for follow-up and/or issues identified: No . I-vent associated with next due treatment: No . MD and/or nursing notified: No  Alan [name] 11/21/2019 10:00 AM

## 2019-11-24 ENCOUNTER — Ambulatory Visit (HOSPITAL_COMMUNITY)
Admission: RE | Admit: 2019-11-24 | Discharge: 2019-11-24 | Disposition: A | Discharge status: Home / Self Care | Payer: Federal, State, Local not specified - PPO | Source: Ambulatory Visit | Attending: Physician Assistant | Admitting: Physician Assistant

## 2019-11-24 ENCOUNTER — Ambulatory Visit (INDEPENDENT_AMBULATORY_CARE_PROVIDER_SITE_OTHER): Payer: Federal, State, Local not specified - PPO | Admitting: Cardiology

## 2019-11-24 ENCOUNTER — Other Ambulatory Visit: Payer: Self-pay

## 2019-11-24 ENCOUNTER — Encounter: Payer: Self-pay | Admitting: Cardiology

## 2019-11-24 VITALS — BP 128/78 | HR 92 | Ht 76.0 in | Wt 182.2 lb

## 2019-11-24 DIAGNOSIS — I471 Supraventricular tachycardia: Secondary | ICD-10-CM | POA: Diagnosis not present

## 2019-11-24 DIAGNOSIS — C3491 Malignant neoplasm of unspecified part of right bronchus or lung: Secondary | ICD-10-CM | POA: Insufficient documentation

## 2019-11-24 MED ORDER — IOHEXOL 300 MG/ML  SOLN
75.0000 mL | Freq: Once | INTRAMUSCULAR | Status: AC | PRN
  Administered 2019-11-24: 13:00:00 75 mL via INTRAVENOUS

## 2019-11-24 MED ORDER — SODIUM CHLORIDE (PF) 0.9 % IJ SOLN
INTRAMUSCULAR | Status: AC
  Filled 2019-11-24: qty 50

## 2019-11-24 NOTE — Progress Notes (Signed)
Electrophysiology Office Note   Date:  11/24/2019   ID:  Alan [name], DOB 1956/05/04, MRN [id number]  PCP:  Aurea Graff.[name], MD  Cardiologist:  Ellyn Hack Primary Electrophysiologist:  Conner Muegge Meredith Leeds, MD    Chief Complaint: SVT   History of Present Illness: Alan [name] is a 64 y.o. male who is being seen today for the evaluation of SVT at the request of [name], L.[name], MD. Presenting today for electrophysiology evaluation.  He has a history of coronary artery disease status post PCI, MVR for MVP and severe mitral regurgitation.  He was diagnosed with lung cancer in June 2019 and is now status post chemo and XRT.  He was in the hospital 09/06/2019 with palpitations and lightheadedness.  He was walking his dog at the time.  His heart rate was 190 bpm, but got as fast as 215 bpm.  He was given 18 mg of adenosine which converted him to sinus rhythm.  He was switched to Toprol-XL 50 mg.  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  Today, denies symptoms of palpitations, chest pain, shortness of breath, orthopnea, PND, lower extremity edema, claudication, dizziness, presyncope, syncope, bleeding, or neurologic sequela. The patient is tolerating medications without difficulties.  He presents today feeling overall well.  He has had no further episodes of tachycardia.  He has noticed an abdominal discomfort, but he does not feel that this is related to his heart.  Otherwise he is able to do his daily activities.  He is excited to return to exercise.   Past Medical History:  Diagnosis Date  . CAD S/P percutaneous coronary angioplasty 2004   which showed essentially normal LV size and function, moderately dilated left atrium, moderate mitral prolapse with mild to moderate regurgitation.  . Dyslipidemia, goal LDL below 70   . Essential hypertension   . GERD (gastroesophageal reflux disease)   . History of Mitral valve prolapse    Moderate - with moderate MR, noted February  t 2013  . History of Severe mitral regurgitation by prior echocardiogram 02/06/2014   TEE: Severe mitral regurgitation with a flail P2 segment and ruptured; Normal LV size & function, dilated LA.  Marland Kitchen History of stress test 12/2009   he walked 9 mins reaching 10 METS. There was an attenuation artifact in the inferior region but no ischemia or infaract, low risk.  . Incidental lung nodule, greater than or equal to 1mm 03/16/2014   Ground glass opacity RML noted on CT scan  . lung ca dx'd 03/2019  . PONV (postoperative nausea and vomiting)   . 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 ST elevation myocardial infarction (STEMI) of anterior wall, subsequent episode of care [facility]) 2004   he had a proximal LAD occlusion treated with 2 overlapping 3.5 x 1.8 mm Cypher DES stents.   Past Surgical History:  Procedure Laterality Date  . ANTERIOR CRUCIATE LIGAMENT REPAIR Left 1993  . CARDIAC CATHETERIZATION  2004   he had a proximal LAD occlusion treated with 2 overlapping 3.5 x 1.8 mm Cypher DES stents.  Marland Kitchen CARDIAC CATHETERIZATION  2007; 03/2014   Widely patent LAD stents, 40% distal LAD, 30% Cx, ~50% PL-PDA bifurcation lesion   . COLONOSCOPY    . INTRAOPERATIVE TRANSESOPHAGEAL  ECHOCARDIOGRAM N/A 04/08/2014   Procedure: INTRAOPERATIVE TRANSESOPHAGEAL ECHOCARDIOGRAM;  Surgeon: Rexene Alberts, MD;  Location: [facility];  Service: Open Heart Surgery;  Laterality: N/A;  . LEFT AND RIGHT HEART CATHETERIZATION WITH CORONARY ANGIOGRAM N/A 03/05/2014   Procedure: LEFT AND RIGHT HEART CATHETERIZATION WITH CORONARY ANGIOGRAM;  Surgeon: Leonie Man, MD;  Location: [facility] CATH LAB;  Service: Cardiovascular;  Laterality: N/A;  . LEFT HEART CATHETERIZATION  WITH CORONARY ANGIOGRAM N/A 09/15/2011   Procedure: LEFT HEART CATHETERIZATION WITH CORONARY ANGIOGRAM;  Surgeon: Lorretta Harp, MD;  Location: [facility] CATH LAB;  Service: Cardiovascular;  Laterality: N/A;  . MITRAL VALVE REPAIR Right 04/08/2014   Procedure: MINIMALLY INVASIVE MITRAL VALVE REPAIR (MVR);  Surgeon: Rexene Alberts, MD;  Location: [facility];  Service: Open Heart Surgery;  Laterality: Right;  . NM MYOVIEW LTD  May 2011   Walk 9 minutes, and 10 METs, diaphragmatic attenuation but no ischemia or infarction.  . SVT ABLATION N/A 10/17/2019   Procedure: SVT ABLATION;  Surgeon: Constance Haw, MD;  Location: [facility] CV LAB;  Service: Cardiovascular;  Laterality: N/A;  . TEE WITHOUT CARDIOVERSION N/A 02/06/2014   Procedure: TRANSESOPHAGEAL ECHOCARDIOGRAM (TEE);  Surgeon: Pixie Casino, MD;  Normal LV Size U& function - EF 55-60%, no regional WMA.  MV P2 Leaflet is flail with ruptured chord with severe prolapse, anterior leaflet intact.  Severe, eccentric anterior directed MR with dilated LA.  Marland Kitchen TRANSTHORACIC ECHOCARDIOGRAM  07/18/2018   Normal LV size and function.  EF 50-60 %.  Unable to assess diastolic function.  Mitral valve sewing ring in place.  Mild stenosis with a gradient of 6 mmHg noted. -  Domingo Dimes ECHOCARDIOGRAM  June 30 2015   Low normal LV function with EF 50-55%. GR 1 DD. No MR. Normal gradient of 4 mmHg the mitral valve. No prolapse.  Marland Kitchen VIDEO BRONCHOSCOPY WITH ENDOBRONCHIAL ULTRASOUND N/A 05/16/2019   Procedure: VIDEO BRONCHOSCOPY WITH ENDOBRONCHIAL ULTRASOUND;  Surgeon: Marshell Garfinkel, MD;  Location: [facility];  Service: Pulmonary;  Laterality: N/A;     Current Outpatient Medications  Medication Sig Dispense Refill  . Ascorbic Acid (VITAMIN C) 1000 MG tablet Take 2,000 mg by mouth daily.     . Coenzyme Q10 (CO Q 10) 100 MG CAPS Take 100 mg by mouth daily.     Marland Kitchen MAGNESIUM PO Take 1 tablet by mouth at bedtime.    . Melatonin 10 MG TABS Take 10 mg by mouth at  bedtime.    . prochlorperazine (COMPAZINE) 10 MG tablet Take 1 tablet (10 mg total) by mouth every 6 (six) hours as needed for nausea or vomiting. 30 tablet 0  . Specialty Vitamins Products (PROSTATE PO) Take 1 capsule by mouth daily. PROSTATE FORMULA    . Vitamin D-Vitamin K (D3 + K2 DOTS PO) Take 1 tablet by mouth daily.    Marland Kitchen zolpidem (AMBIEN) 5 MG tablet TAKE 1-2 TABLET BY MOUTH EVERY NIGHT AT BEDTIME AS NEEDED FOR SLEEP 120 tablet 2   No current facility-administered medications for this visit.   Facility-Administered Medications Ordered in Other Visits  Medication Dose Route Frequency Provider Last Rate Last Admin  . sodium chloride (PF) 0.9 % injection             Allergies:   A-cillin [ampicillin], Amoxicillin, Other, Penicillins, and Sulfa antibiotics   Social History:  The patient  reports that he has never smoked. He has never used smokeless tobacco. He reports current alcohol use of about 10.0  standard drinks of alcohol per week. He reports that he does not use drugs.   Family History:  The patient's family history includes Cancer in his paternal grandmother; Heart Problems in his father; Hypertension in his mother.    ROS:  Please see the history of present illness.   Otherwise, review of systems is positive for none.   All other systems are reviewed and negative.   PHYSICAL EXAM: VS:  BP 128/78   Pulse 92   Ht 6\' 4"  (1.93 m)   Wt 182 lb 3.2 oz (82.6 kg)   SpO2 97%   BMI 22.18 kg/m  , BMI Body mass index is 22.18 kg/m. GEN: Well nourished, well developed, in no acute distress  HEENT: normal  Neck: no JVD, carotid bruits, or masses Cardiac: RRR; no murmurs, rubs, or gallops,no edema  Respiratory:  clear to auscultation bilaterally, normal work of breathing GI: soft, nontender, nondistended, + BS MS: no deformity or atrophy  Skin: warm and dry Neuro:  Strength and sensation are intact Psych: euthymic mood, full affect  EKG:  EKG is ordered today. Personal review  of the ekg ordered shows sinus rhythm, rate 103  Recent Labs: 09/06/2019: B Natriuretic Peptide 52.0; Magnesium 1.9 10/29/2019: ALT 14; BUN 14; Creatinine 0.76; Hemoglobin 13.9; Platelet Count 201; Potassium 4.0; Sodium 142; TSH 1.163    Lipid Panel  No results found for: CHOL, TRIG, HDL, CHOLHDL, VLDL, LDLCALC, LDLDIRECT   Wt Readings from Last 3 Encounters:  11/24/19 182 lb 3.2 oz (82.6 kg)  10/29/19 178 lb 9.6 oz (81 kg)  10/17/19 170 lb (77.1 kg)      Other studies Reviewed: Additional studies/ records that were reviewed today include: TTE 09/02/19  Review of the above records today demonstrates:  1. Left ventricular ejection fraction, by visual estimation, is 40 to  45%. The left ventricle has mildly decreased function. There is no left  ventricular hypertrophy.  2. Left ventricular diastolic parameters are consistent with Grade I  diastolic dysfunction (impaired relaxation).  3. The left ventricle has no regional wall motion abnormalities.  4. Global right ventricle has normal systolic function.The right  ventricular size is normal. No increase in right ventricular wall  thickness.  5. Left atrial size was normal.  6. Right atrial size was normal.  7. The mitral valve has been repaired/replaced. Trivial mitral valve  regurgitation.  8. The tricuspid valve is grossly normal.  9. The tricuspid valve is grossly normal. Tricuspid valve regurgitation  is trivial.  10. The aortic valve is tricuspid. Aortic valve regurgitation is not  visualized.  11. The pulmonic valve was grossly normal. Pulmonic valve regurgitation is  not visualized.  12. Aortic dilatation noted.  13. 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  14. The atrial septum is grossly normal.  15. The average left ventricular global longitudinal strain is -20.2 %.    ASSESSMENT AND PLAN:  1.  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  No further episodes of  palpitations.  We Kayly Kriegel stop his diltiazem today.  No other changes.  2.  Coronary artery disease: Status post LAD PCI for STEMI.  No current chest pain.  3.  Mitral valve prolapse: Status post mitral valve repair.  Echo shows stable valve.  No changes.  4.  Hypertension: Currently well controlled  Current medicines are reviewed at length with the patient today.   The patient does not have concerns regarding his medicines.  The following changes  were made today: Stop diltiazem  Labs/ tests ordered today include:  Orders Placed This Encounter  Procedures  . EKG 12-Lead     Disposition:   FU with Jame Seelig as needed months  Signed, Magdalyn Arenivas Meredith Leeds, MD  11/24/2019 3:08 PM     [facility] [address] [place] [address] [phone] (office) [phone] (fax)

## 2019-11-24 NOTE — Patient Instructions (Addendum)
Your physician has recommended you make the following change in your medication:  1. STOP Diltiazem  *If you need a refill on your cardiac medications before your next appointment, please call your pharmacy*   Lab Work: None ordered If you have labs (blood work) drawn today and your tests are completely normal, you will receive your results only by: Marland Kitchen MyChart Message (if you have MyChart) OR . A paper copy in the mail If you have any lab test that is abnormal or we need to change your treatment, we will call you to review the results.   Testing/Procedures: None ordered   Follow-Up: At [facility], you and your health needs are our priority.  As part of our continuing mission to provide you with exceptional heart care, we have created designated Provider Care Teams.  These Care Teams include your primary Cardiologist (physician) and Advanced Practice Providers (APPs -  Physician Assistants and Nurse Practitioners) who all work together to provide you with the care you need, when you need it.  We recommend signing up for the patient portal called "MyChart".  Sign up information is provided on this After Visit Summary.  MyChart is used to connect with patients for Virtual Visits (Telemedicine).  Patients are able to view lab/test results, encounter notes, upcoming appointments, etc.  Non-urgent messages can be sent to your provider as well.   To learn more about what you can do with MyChart, go to [email or web address].    Your next appointment:    As needed  The format for your next appointment:   In Person  Provider:   Allegra Lai, MD   Thank you for choosing [facility]!!   Trinidad Curet, RN [phone]    Other Instructions

## 2019-11-25 ENCOUNTER — Other Ambulatory Visit: Payer: Federal, State, Local not specified - PPO

## 2019-11-27 ENCOUNTER — Inpatient Hospital Stay: Payer: Federal, State, Local not specified - PPO

## 2019-11-27 ENCOUNTER — Other Ambulatory Visit: Payer: Self-pay

## 2019-11-27 ENCOUNTER — Encounter: Payer: Self-pay | Admitting: Internal Medicine

## 2019-11-27 ENCOUNTER — Other Ambulatory Visit: Payer: Federal, State, Local not specified - PPO

## 2019-11-27 ENCOUNTER — Inpatient Hospital Stay: Payer: Federal, State, Local not specified - PPO | Attending: Internal Medicine | Admitting: Internal Medicine

## 2019-11-27 VITALS — BP 106/79 | HR 97 | Temp 98.0°F | Resp 18 | Ht 76.0 in | Wt 182.9 lb

## 2019-11-27 DIAGNOSIS — C3431 Malignant neoplasm of lower lobe, right bronchus or lung: Secondary | ICD-10-CM | POA: Diagnosis not present

## 2019-11-27 DIAGNOSIS — R05 Cough: Secondary | ICD-10-CM | POA: Diagnosis not present

## 2019-11-27 DIAGNOSIS — C3491 Malignant neoplasm of unspecified part of right bronchus or lung: Secondary | ICD-10-CM

## 2019-11-27 DIAGNOSIS — I10 Essential (primary) hypertension: Secondary | ICD-10-CM | POA: Diagnosis not present

## 2019-11-27 DIAGNOSIS — I700 Atherosclerosis of aorta: Secondary | ICD-10-CM | POA: Diagnosis not present

## 2019-11-27 DIAGNOSIS — Z5112 Encounter for antineoplastic immunotherapy: Secondary | ICD-10-CM | POA: Insufficient documentation

## 2019-11-27 DIAGNOSIS — I2510 Atherosclerotic heart disease of native coronary artery without angina pectoris: Secondary | ICD-10-CM | POA: Diagnosis not present

## 2019-11-27 DIAGNOSIS — Z79899 Other long term (current) drug therapy: Secondary | ICD-10-CM | POA: Insufficient documentation

## 2019-11-27 DIAGNOSIS — Z5111 Encounter for antineoplastic chemotherapy: Secondary | ICD-10-CM | POA: Diagnosis not present

## 2019-11-27 LAB — CMP (CANCER CENTER ONLY)
ALT: 16 U/L (ref 0–44)
AST: 18 U/L (ref 15–41)
Albumin: 3.9 g/dL (ref 3.5–5.0)
Alkaline Phosphatase: 87 U/L (ref 38–126)
Anion gap: 10 (ref 5–15)
BUN: 13 mg/dL (ref 8–23)
CO2: 24 mmol/L (ref 22–32)
Calcium: 9.5 mg/dL (ref 8.9–10.3)
Chloride: 108 mmol/L (ref 98–111)
Creatinine: 0.81 mg/dL (ref 0.61–1.24)
GFR, Est AFR Am: >60 mL/min (ref >60)
GFR, Estimated: >60 mL/min (ref >60)
Glucose, Bld: 118 mg/dL — ABNORMAL HIGH (ref 70–99)
Potassium: 4.1 mmol/L (ref 3.5–5.1)
Sodium: 142 mmol/L (ref 135–145)
Total Bilirubin: 0.8 mg/dL (ref 0.3–1.2)
Total Protein: 6.7 g/dL (ref 6.5–8.1)

## 2019-11-27 LAB — CBC WITH DIFFERENTIAL (CANCER CENTER ONLY)
Abs Immature Granulocytes: 0.02 10*3/uL (ref 0.00–0.07)
Basophils Absolute: 0 10*3/uL (ref 0.0–0.1)
Basophils Relative: 1 %
Eosinophils Absolute: 0.2 10*3/uL (ref 0.0–0.5)
Eosinophils Relative: 5 %
HCT: 42.5 % (ref 39.0–52.0)
Hemoglobin: 14.3 g/dL (ref 13.0–17.0)
Immature Granulocytes: 1 %
Lymphocytes Relative: 13 %
Lymphs Abs: 0.6 10*3/uL — ABNORMAL LOW (ref 0.7–4.0)
MCH: 31.6 pg (ref 26.0–34.0)
MCHC: 33.6 g/dL (ref 30.0–36.0)
MCV: 94 fL (ref 80.0–100.0)
Monocytes Absolute: 0.4 10*3/uL (ref 0.1–1.0)
Monocytes Relative: 10 %
Neutro Abs: 2.9 10*3/uL (ref 1.7–7.7)
Neutrophils Relative %: 70 %
Platelet Count: 199 10*3/uL (ref 150–400)
RBC: 4.52 MIL/uL (ref 4.22–5.81)
RDW: 12.5 % (ref 11.5–15.5)
WBC Count: 4.2 10*3/uL (ref 4.0–10.5)
nRBC: 0 % (ref 0.0–0.2)

## 2019-11-27 LAB — TSH: TSH: 1.71 u[IU]/mL (ref 0.320–4.118)

## 2019-11-27 MED ORDER — SODIUM CHLORIDE 0.9 % IV SOLN
1500.0000 mg | Freq: Once | INTRAVENOUS | Status: AC
  Administered 2019-11-27: 1500 mg via INTRAVENOUS
  Filled 2019-11-27: qty 30

## 2019-11-27 MED ORDER — SODIUM CHLORIDE 0.9 % IV SOLN
Freq: Once | INTRAVENOUS | Status: AC
  Filled 2019-11-27: qty 250

## 2019-11-27 NOTE — Progress Notes (Signed)
[place] [phone]   [phone]  OFFICE PROGRESS NOTE  [name], L.[name], [address] [place] [address]  DIAGNOSIS: stage IIIb (T2b, N3, M0) non-small cell lung cancer, adenocarcinoma presented with right lower lobe lung nodule in addition to right hilar mass/node as well as ipsilateral and contralateral mediastinal lymphadenopathy diagnosed in October 2020.  Molecular studies by [facility] [employer] showed no actionable mutations.  PRIOR THERAPY: Concurrent chemoradiation with weekly carboplatin for AUC of 2 and paclitaxel 45 mg/M2.  Status post 7 cycles.  Last dose was given July 21, 2019  CURRENT THERAPY: Consolidation treatment with immunotherapy with Imfinzi 1500 mg.  Status post 2 cycles.  INTERVAL HISTORY: Alan [name] 64 y.o. male returns to the clinic today for follow-up visit.  The patient continues to have dry cough.  He completed the ablation by cardiology for the SVT.  He denied having any current chest pain but has shortness of breath with exertion with dry cough and no hemoptysis.  He denied having any recent weight loss or night sweats.  He has no nausea, vomiting, diarrhea or constipation.  He has no headache or visual changes.  The patient tolerated the second cycle of his consolidation treatment with Imfinzi fairly well.  He had repeat CT scan of the chest performed recently and he is here for evaluation and discussion of his scan results.  MEDICAL HISTORY: Past Medical History:  Diagnosis Date  . CAD S/P percutaneous coronary angioplasty 2004   which showed essentially normal LV size and function, moderately dilated left atrium, moderate mitral prolapse with mild to moderate regurgitation.  . Dyslipidemia, goal LDL below 70   . Essential hypertension   . GERD (gastroesophageal reflux disease)   . History of Mitral valve prolapse    Moderate - with moderate MR, noted February t 2013  . History of  Severe mitral regurgitation by prior echocardiogram 02/06/2014   TEE: Severe mitral regurgitation with a flail P2 segment and ruptured; Normal LV size & function, dilated LA.  Marland Kitchen History of stress test 12/2009   he walked 9 mins reaching 10 METS. There was an attenuation artifact in the inferior region but no ischemia or infaract, low risk.  . Incidental lung nodule, greater than or equal to 57mm 03/16/2014   Ground glass opacity RML noted on CT scan  . lung ca dx'd 03/2019  . PONV (postoperative nausea and vomiting)   . 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 ST elevation myocardial infarction (STEMI) of anterior wall, subsequent episode of care [facility]) 2004   he had a proximal LAD occlusion treated with 2 overlapping 3.5 x 1.8 mm Cypher DES stents.    ALLERGIES:  is allergic to a-cillin [ampicillin]; amoxicillin; other; penicillins; and sulfa antibiotics.  MEDICATIONS:  Current Outpatient Medications  Medication Sig Dispense Refill  . Ascorbic Acid (VITAMIN C) 1000 MG tablet Take 2,000 mg by mouth daily.     . Coenzyme Q10 (CO Q 10) 100 MG CAPS Take 100 mg by mouth daily.     Marland Kitchen MAGNESIUM PO Take 1 tablet by mouth at bedtime.    . Melatonin 10 MG TABS Take 10 mg by mouth at bedtime.    . prochlorperazine (  COMPAZINE) 10 MG tablet Take 1 tablet (10 mg total) by mouth every 6 (six) hours as needed for nausea or vomiting. (Patient not taking: Reported on 11/27/2019) 30 tablet 0  . Specialty Vitamins Products (PROSTATE PO) Take 1 capsule by mouth daily. PROSTATE FORMULA    . Vitamin D-Vitamin K (D3 + K2 DOTS PO) Take 1 tablet by mouth daily.    Marland Kitchen zolpidem (AMBIEN) 5 MG tablet TAKE 1-2 TABLET BY MOUTH EVERY NIGHT AT BEDTIME AS  NEEDED FOR SLEEP (Patient not taking: Reported on 11/27/2019) 120 tablet 2   No current facility-administered medications for this visit.    SURGICAL HISTORY:  Past Surgical History:  Procedure Laterality Date  . ANTERIOR CRUCIATE LIGAMENT REPAIR Left 1993  . CARDIAC CATHETERIZATION  2004   he had a proximal LAD occlusion treated with 2 overlapping 3.5 x 1.8 mm Cypher DES stents.  Marland Kitchen CARDIAC CATHETERIZATION  2007; 03/2014   Widely patent LAD stents, 40% distal LAD, 30% Cx, ~50% PL-PDA bifurcation lesion   . COLONOSCOPY    . INTRAOPERATIVE TRANSESOPHAGEAL ECHOCARDIOGRAM N/A 04/08/2014   Procedure: INTRAOPERATIVE TRANSESOPHAGEAL ECHOCARDIOGRAM;  Surgeon: Rexene Alberts, MD;  Location: [facility];  Service: Open Heart Surgery;  Laterality: N/A;  . LEFT AND RIGHT HEART CATHETERIZATION WITH CORONARY ANGIOGRAM N/A 03/05/2014   Procedure: LEFT AND RIGHT HEART CATHETERIZATION WITH CORONARY ANGIOGRAM;  Surgeon: Leonie Man, MD;  Location: [facility] CATH LAB;  Service: Cardiovascular;  Laterality: N/A;  . LEFT HEART CATHETERIZATION WITH CORONARY ANGIOGRAM N/A 09/15/2011   Procedure: LEFT HEART CATHETERIZATION WITH CORONARY ANGIOGRAM;  Surgeon: Lorretta Harp, MD;  Location: [facility] CATH LAB;  Service: Cardiovascular;  Laterality: N/A;  . MITRAL VALVE REPAIR Right 04/08/2014   Procedure: MINIMALLY INVASIVE MITRAL VALVE REPAIR (MVR);  Surgeon: Rexene Alberts, MD;  Location: [facility];  Service: Open Heart Surgery;  Laterality: Right;  . NM MYOVIEW LTD  May 2011   Walk 9 minutes, and 10 METs, diaphragmatic attenuation but no ischemia or infarction.  . SVT ABLATION N/A 10/17/2019   Procedure: SVT ABLATION;  Surgeon: Constance Haw, MD;  Location: [facility] CV LAB;  Service: Cardiovascular;  Laterality: N/A;  . TEE WITHOUT CARDIOVERSION N/A 02/06/2014   Procedure: TRANSESOPHAGEAL ECHOCARDIOGRAM (TEE);  Surgeon: Pixie Casino, MD;  Normal LV Size U& function - EF 55-60%, no regional WMA.  MV P2 Leaflet is flail with  ruptured chord with severe prolapse, anterior leaflet intact.  Severe, eccentric anterior directed MR with dilated LA.  Marland Kitchen TRANSTHORACIC ECHOCARDIOGRAM  07/18/2018   Normal LV size and function.  EF 50-60 %.  Unable to assess diastolic function.  Mitral valve sewing ring in place.  Mild stenosis with a gradient of 6 mmHg noted. -  Domingo Dimes ECHOCARDIOGRAM  June 30 2015   Low normal LV function with EF 50-55%. GR 1 DD. No MR. Normal gradient of 4 mmHg the mitral valve. No prolapse.  Marland Kitchen VIDEO BRONCHOSCOPY WITH ENDOBRONCHIAL ULTRASOUND N/A 05/16/2019   Procedure: VIDEO BRONCHOSCOPY WITH ENDOBRONCHIAL ULTRASOUND;  Surgeon: Marshell Garfinkel, MD;  Location: [facility];  Service: Pulmonary;  Laterality: N/A;    REVIEW OF SYSTEMS:  Constitutional: positive for fatigue Eyes: negative Ears, nose, mouth, throat, and face: negative Respiratory: positive for cough and dyspnea on exertion Cardiovascular: negative Gastrointestinal: negative Genitourinary:negative Integument/breast: negative Hematologic/lymphatic: negative Musculoskeletal:negative Neurological: negative Behavioral/Psych: negative Endocrine: negative Allergic/Immunologic: negative   PHYSICAL EXAMINATION: General appearance: alert, cooperative, fatigued and no distress Head: Normocephalic, without obvious abnormality, atraumatic Neck: no adenopathy,  no JVD, supple, symmetrical, trachea midline and thyroid not enlarged, symmetric, no tenderness/mass/nodules Lymph nodes: Cervical, supraclavicular, and axillary nodes normal. Resp: rales RUL Back: symmetric, no curvature. ROM normal. No CVA tenderness. Cardio: regular rate and rhythm, S1, S2 normal, no murmur, click, rub or gallop GI: soft, non-tender; bowel sounds normal; no masses,  no organomegaly Extremities: extremities normal, atraumatic, no cyanosis or edema Neurologic: Alert and oriented X 3, normal strength and tone. Normal symmetric reflexes. Normal coordination and  gait  ECOG PERFORMANCE STATUS: 1 - Symptomatic but completely ambulatory  Blood pressure 106/79, pulse 97, temperature 98 F (36.7 C), temperature source Oral, resp. rate 18, height 6\' 4"  (1.93 m), weight 182 lb 14.4 oz (83 kg), SpO2 99 %.  LABORATORY DATA: Lab Results  Component Value Date   WBC 4.2 11/27/2019   HGB 14.3 11/27/2019   HCT 42.5 11/27/2019   MCV 94.0 11/27/2019   PLT 199 11/27/2019      Chemistry      Component Value Date/Time   NA 142 11/27/2019 0847   K 4.1 11/27/2019 0847   CL 108 11/27/2019 0847   CO2 24 11/27/2019 0847   BUN 13 11/27/2019 0847   CREATININE 0.81 11/27/2019 0847   CREATININE 0.80 02/27/2014 1152      Component Value Date/Time   CALCIUM 9.5 11/27/2019 0847   ALKPHOS 87 11/27/2019 0847   AST 18 11/27/2019 0847   ALT 16 11/27/2019 0847   BILITOT 0.8 11/27/2019 0847       RADIOGRAPHIC STUDIES: CT Chest W Contrast  Result Date: 11/24/2019 CLINICAL DATA:  Restaging lung cancer. EXAM: CT CHEST WITH CONTRAST TECHNIQUE: Multidetector CT imaging of the chest was performed during intravenous contrast administration. CONTRAST:  1mL OMNIPAQUE IOHEXOL 300 MG/ML  SOLN COMPARISON:  08/12/2019 FINDINGS: Cardiovascular: The heart size appears within normal limits. No pericardial effusion identified. Aortic atherosclerosis. Three vessel coronary artery atherosclerotic calcifications. Mediastinum/Nodes: The Normal appearance of the thyroid gland. The trachea appears patent and is midline. Normal appearance of the esophagus. No enlarged supraclavicular or axillary lymph nodes. Right paratracheal lymph node is unchanged measuring 1.1 cm, image 74/2. Right pre-vascular node measures 1.1 cm, image 66/2. Previously 1.2 cm. Lungs/Pleura: Small right effusion is identified overlying the posterior right middle lobe and right upper lobe, image 73/2. Likely reactive. There are progressive changes of external beam radiation identified within the right lung when compared  with 08/12/2019. Specifically, there has been increase in right upper lobe and right middle lobe paramediastinal fibrosis and masslike architectural distortion. Previously described linear density in the right middle lobe is not seen on today's exam separate from changes of external beam radiation. No new pulmonary nodule or mass identified the Upper Abdomen: No acute abnormality. Musculoskeletal: No chest wall abnormality. No acute or significant osseous findings. IMPRESSION: 1. Interval increase in right upper lobe and right middle lobe paramediastinal fibrosis and masslike architectural distortion. Findings are compatible with progressive changes of external beam radiation. 2. Stable appearance of borderline enlarged right paratracheal and pre-vascular lymph nodes. 3. Three vessel coronary artery atherosclerotic calcifications. 4. Aortic atherosclerosis. Aortic Atherosclerosis (ICD10-I70.0). Electronically Signed   By: Kerby Moors M.D.   On: 11/24/2019 14:30    ASSESSMENT AND PLAN: This is a very pleasant 64 years old white male recently diagnosed with a stage IIIb non-small cell lung cancer, adenocarcinoma with no actionable mutation.  He completed a course of concurrent chemoradiation with weekly carboplatin and paclitaxel status post 7 cycles.   His treatment was complicated  with dysphagia, odynophagia secondary to radiation induced esophagitis.  He also has persistent cough and shortness of breath secondary to radiation-induced pneumonitis. He has partial response after this treatment. The patient is currently undergoing treatment with consolidation immunotherapy with Imfinzi 1500 mg IV every 4 weeks is status post 2 cycles. He tolerated the second cycle of his treatment fairly well with no concerning adverse effect except for the persistent dry cough. He had repeat CT scan of the chest performed recently.  I personally and independently reviewed the scan images and discussed the results with the  patient today. His scan showed no concerning findings for disease progression but there was increase in the paramediastinal fibrosis secondary to radiation effects. I recommended for the patient to proceed with cycle #3 of his immunotherapy today as planned. I will see the patient back for follow-up visit in 4 weeks for evaluation before the next cycle of his treatment. For the dry cough he will continue with Tussionex on as-needed basis. The patient was advised to call immediately if he has any other concerning symptoms in the interval. The patient voices understanding of current disease status and treatment options and is in agreement with the current care plan. All questions were answered. The patient knows to call the clinic with any problems, questions or concerns. We can certainly see the patient much sooner if necessary.  The total time spent in the appointment was 35 minutes.  Disclaimer: This note was dictated with voice recognition software. Similar sounding words can inadvertently be transcribed and may not be corrected upon review.

## 2019-11-27 NOTE — Patient Instructions (Signed)
[facility] Discharge Instructions for Patients Receiving Chemotherapy  Today you received the following chemotherapy agents: durvalumab.  To help prevent nausea and vomiting after your treatment, we encourage you to take your nausea medication as prescribed.   If you develop nausea and vomiting that is not controlled by your nausea medication, call the clinic.   BELOW ARE SYMPTOMS THAT SHOULD BE REPORTED IMMEDIATELY:  *FEVER GREATER THAN 100.5 F  *CHILLS WITH OR WITHOUT FEVER  NAUSEA AND VOMITING THAT IS NOT CONTROLLED WITH YOUR NAUSEA MEDICATION  *UNUSUAL SHORTNESS OF BREATH  *UNUSUAL BRUISING OR BLEEDING  TENDERNESS IN MOUTH AND THROAT WITH OR WITHOUT PRESENCE OF ULCERS  *URINARY PROBLEMS  *BOWEL PROBLEMS  UNUSUAL RASH Items with * indicate a potential emergency and should be followed up as soon as possible.  Feel free to call the clinic should you have any questions or concerns. The clinic phone number is [phone].  Please show the [facility] at check-in to the Emergency Department and triage nurse.

## 2019-12-01 ENCOUNTER — Telehealth: Payer: Self-pay | Admitting: Internal Medicine

## 2019-12-01 NOTE — Telephone Encounter (Signed)
Scheduled per los. Called and left msg. Mailed printout  °

## 2019-12-19 NOTE — Progress Notes (Signed)
Pharmacist Chemotherapy Monitoring - Follow Up Assessment    I verify that I have reviewed each item in the below checklist:  . Regimen for the patient is scheduled for the appropriate day and plan matches scheduled date. Marland Kitchen Appropriate non-routine labs are ordered dependent on drug ordered. . If applicable, additional medications reviewed and ordered per protocol based on lifetime cumulative doses and/or treatment regimen.   Plan for follow-up and/or issues identified: No . I-vent associated with next due treatment: No . MD and/or nursing notified: No  Britt Boozer 12/19/2019 12:20 PM

## 2019-12-24 NOTE — Progress Notes (Signed)
[facility] OFFICE PROGRESS NOTE  [name], L.[name], [place] [address] [employer] [address] [place] [address]  DIAGNOSIS: stage IIIb (T2b, N3, M0) non-small cell lung cancer, adenocarcinoma presented with right lower lobe lung nodule in addition to right hilar mass/node as well as ipsilateral and contralateral mediastinal lymphadenopathy diagnosed in October 2020.  Molecular studies by [employer] 360showed no actionable mutations.  PRIOR THERAPY: Concurrent chemoradiation with weekly carboplatin for AUC of 2 and paclitaxel 45 mg/M2. Status post 7 cycles. Last dose was given July 21, 2019  CURRENT THERAPY: Consolidation treatment with immunotherapy with Imfinzi 1500 mg.Status post 4 cycles.  INTERVAL HISTORY: Alan [name] 64 y.o. male returns to the clinic for a follow up visit.  The patient has been trying to increase his physical activity recently.  He restarted taking his spin classes last week.  He is also been walking on a 2 mile walking trail.  The patient mentions that after the first mile of walking, he experiences numbness in his feet and periods of his anterior quads going numb.  He rests for a few seconds and the numbness resolves.  The patient started noticing the symptoms approximately 2 to 3 weeks ago. The patient denies any leg cramping, saddle anesthesia, urinary or stool incontinence/retention, or back pain.  Per chart review, it appears that the patient had some degenerative disc disease at L2-3 that was noted on an abdominal CT scan in 2015.  Patient denies any recent trauma or injuries.  The patient states that his symptoms occur more when he is walking on flat ground or downhill.  The patient also mentions that he has been having some dizzy spells recently.  He has a history of vertigo that was debilitating a few years ago.  The patient states that he notices some dizziness when he is walking or picking things up.  He also states that he experiences this  while sitting as well.  The patient had a SVT ablation in March 2021.  The patient denies any significant palpitations when these episodes occur.  He denies any other associated symptoms such as visual changes or nausea.  He states that his blood pressure tends to run on the low side.  He has not started any new medications recently.  Denies any fever, chills, night sweats, or weight loss. Denies any hemoptysis.  He reports that his shortness of breath is improved since increasing his physical activity.  He still experiences some chest discomfort with deep breathing as well as coughing fits in the morning secondary to his nasal drainage.  He states that his nasal drainage is white or clear.  Denies any nausea, vomiting, or diarrhea.  He has some mild constipation which is improved with a stool softener.  Denies any headaches.   He notes says that his distance vision is not as good as it used to be.  Denies any rashes or skin changes. The patient is here today for evaluation prior to starting cycle # 5.   MEDICAL HISTORY: Past Medical History:  Diagnosis Date  . CAD S/P percutaneous coronary angioplasty 2004   which showed essentially normal LV size and function, moderately dilated left atrium, moderate mitral prolapse with mild to moderate regurgitation.  . Dyslipidemia, goal LDL below 70   . Essential hypertension   . GERD (gastroesophageal reflux disease)   . History of Mitral valve prolapse    Moderate - with moderate MR, noted February t 2013  . History of Severe mitral regurgitation by  prior echocardiogram 02/06/2014   TEE: Severe mitral regurgitation with a flail P2 segment and ruptured; Normal LV size & function, dilated LA.  Marland Kitchen History of stress test 12/2009   he walked 9 mins reaching 10 METS. There was an attenuation artifact in the inferior region but no ischemia or infaract, low risk.  . Incidental lung nodule, greater than or equal to 61mm 03/16/2014   Ground glass opacity RML noted on CT  scan  . lung ca dx'd 03/2019  . PONV (postoperative nausea and vomiting)   . 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 ST elevation myocardial infarction (STEMI) of anterior wall, subsequent episode of care [facility]) 2004   he had a proximal LAD occlusion treated with 2 overlapping 3.5 x 1.8 mm Cypher DES stents.    ALLERGIES:  is allergic to a-cillin [ampicillin]; amoxicillin; other; penicillins; and sulfa antibiotics.  MEDICATIONS:  Current Outpatient Medications  Medication Sig Dispense Refill  . Ascorbic Acid (VITAMIN C) 1000 MG tablet Take 2,000 mg by mouth daily.     . Coenzyme Q10 (CO Q 10) 100 MG CAPS Take 100 mg by mouth daily.     Marland Kitchen MAGNESIUM PO Take 1 tablet by mouth at bedtime.    . Melatonin 10 MG TABS Take 10 mg by mouth at bedtime.    Marland Kitchen Specialty Vitamins Products (PROSTATE PO) Take 1 capsule by mouth daily. PROSTATE FORMULA    . Vitamin D-Vitamin K (D3 + K2 DOTS PO) Take 1 tablet by mouth daily.    . prochlorperazine (COMPAZINE) 10 MG tablet Take 1 tablet (10 mg total) by mouth every 6 (six) hours as needed for nausea or vomiting. (Patient not taking: Reported on 11/27/2019) 30 tablet 0   No current facility-administered medications for this visit.   Facility-Administered Medications Ordered in Other Visits  Medication Dose Route Frequency Provider Last Rate Last Admin  . durvalumab (IMFINZI) 1,500 mg in sodium chloride 0.9 % 100 mL chemo infusion  1,500 mg Intravenous Once Curt Bears, MD        SURGICAL HISTORY:  Past Surgical History:  Procedure Laterality Date  . ANTERIOR CRUCIATE LIGAMENT REPAIR Left 1993  . CARDIAC CATHETERIZATION  2004   he had a proximal LAD occlusion treated with 2  overlapping 3.5 x 1.8 mm Cypher DES stents.  Marland Kitchen CARDIAC CATHETERIZATION  2007; 03/2014   Widely patent LAD stents, 40% distal LAD, 30% Cx, ~50% PL-PDA bifurcation lesion   . COLONOSCOPY    . INTRAOPERATIVE TRANSESOPHAGEAL ECHOCARDIOGRAM N/A 04/08/2014   Procedure: INTRAOPERATIVE TRANSESOPHAGEAL ECHOCARDIOGRAM;  Surgeon: Rexene Alberts, MD;  Location: [facility];  Service: Open Heart Surgery;  Laterality: N/A;  . LEFT AND RIGHT HEART CATHETERIZATION WITH CORONARY ANGIOGRAM N/A 03/05/2014   Procedure: LEFT AND RIGHT HEART CATHETERIZATION WITH CORONARY ANGIOGRAM;  Surgeon: Leonie Man, MD;  Location: [facility] CATH LAB;  Service: Cardiovascular;  Laterality: N/A;  . LEFT HEART CATHETERIZATION WITH CORONARY ANGIOGRAM N/A 09/15/2011   Procedure: LEFT HEART CATHETERIZATION WITH CORONARY ANGIOGRAM;  Surgeon: Lorretta Harp, MD;  Location: [facility] CATH LAB;  Service: Cardiovascular;  Laterality: N/A;  . MITRAL VALVE REPAIR Right 04/08/2014   Procedure: MINIMALLY INVASIVE MITRAL VALVE REPAIR (MVR);  Surgeon: Rexene Alberts, MD;  Location: [facility];  Service: Open  Heart Surgery;  Laterality: Right;  . NM MYOVIEW LTD  May 2011   Walk 9 minutes, and 10 METs, diaphragmatic attenuation but no ischemia or infarction.  . SVT ABLATION N/A 10/17/2019   Procedure: SVT ABLATION;  Surgeon: Constance Haw, MD;  Location: [facility] CV LAB;  Service: Cardiovascular;  Laterality: N/A;  . TEE WITHOUT CARDIOVERSION N/A 02/06/2014   Procedure: TRANSESOPHAGEAL ECHOCARDIOGRAM (TEE);  Surgeon: Pixie Casino, MD;  Normal LV Size U& function - EF 55-60%, no regional WMA.  MV P2 Leaflet is flail with ruptured chord with severe prolapse, anterior leaflet intact.  Severe, eccentric anterior directed MR with dilated LA.  Marland Kitchen TRANSTHORACIC ECHOCARDIOGRAM  07/18/2018   Normal LV size and function.  EF 50-60 %.  Unable to assess diastolic function.  Mitral valve sewing ring in place.  Mild stenosis with a gradient of 6 mmHg noted. -  Domingo Dimes ECHOCARDIOGRAM  June 30 2015   Low normal LV function with EF 50-55%. GR 1 DD. No MR. Normal gradient of 4 mmHg the mitral valve. No prolapse.  Marland Kitchen VIDEO BRONCHOSCOPY WITH ENDOBRONCHIAL ULTRASOUND N/A 05/16/2019   Procedure: VIDEO BRONCHOSCOPY WITH ENDOBRONCHIAL ULTRASOUND;  Surgeon: Marshell Garfinkel, MD;  Location: [facility];  Service: Pulmonary;  Laterality: N/A;    REVIEW OF SYSTEMS:   Review of Systems  Constitutional: Positive for fatigue and generalized weakness.  Negative for appetite change, chills, fever and unexpected weight change.  HENT: Negative for mouth sores, nosebleeds, sore throat and trouble swallowing.   Eyes: Negative for eye problems and icterus.  Respiratory: Positive for coughing fits in the morning secondary to nasal drainage.  Negative for hemoptysis, shortness of breath and wheezing.   Cardiovascular: Positive for chest discomfort with deep breathing.  Negative for leg swelling.  Gastrointestinal: Negative for abdominal pain, constipation, diarrhea, nausea and vomiting.  Genitourinary: Negative for bladder incontinence, difficulty urinating, dysuria, frequency and hematuria.   Musculoskeletal: Negative for back pain, gait problem, neck pain and neck stiffness.  Skin: Negative for itching and rash.  Neurological: Positive for numbness on his anterior thigh with walking long distances.  Positive for episodic dizziness.  Negative for extremity weakness, gait problem, headaches, and seizures.  Hematological: Negative for adenopathy. Does not bruise/bleed easily.  Psychiatric/Behavioral: Negative for confusion, depression and sleep disturbance. The patient is not nervous/anxious.     PHYSICAL EXAMINATION:  Blood pressure 101/76, pulse 86, temperature 98 F (36.7 C), temperature source Temporal, resp. rate 18, height 6\' 4"  (1.93 m), weight 183 lb 14.4 oz (83.4 kg), SpO2 99 %.  ECOG PERFORMANCE STATUS: 1 - Symptomatic but completely ambulatory  Physical Exam   Constitutional: Oriented to person, place, and time and well-developed, well-nourished, and in no distress.  HENT:  Head: Normocephalic and atraumatic.  Mouth/Throat: Oropharynx is clear and moist. No oropharyngeal exudate.  Eyes: Conjunctivae are normal. Right eye exhibits no discharge. Left eye exhibits no discharge. No scleral icterus.  Neck: Normal range of motion. Neck supple.  Cardiovascular: Normal rate, regular rhythm, normal heart sounds and intact distal pulses.   Pulmonary/Chest: Effort normal and breath sounds normal. No respiratory distress. No wheezes. No rales.  Abdominal: Soft. Bowel sounds are normal. Exhibits no distension and no mass. There is no tenderness.  Musculoskeletal: Normal range of motion. Exhibits no edema.  Lymphadenopathy:    No cervical adenopathy.  Neurological: Alert and oriented to person, place, and time. Exhibits normal muscle tone. Gait normal. Coordination normal.  Skin: Skin is warm and dry. No  rash noted. Not diaphoretic. No erythema. No pallor.  Psychiatric: Mood, memory and judgment normal.  Vitals reviewed.  LABORATORY DATA: Lab Results  Component Value Date   WBC 5.0 12/25/2019   HGB 14.6 12/25/2019   HCT 43.1 12/25/2019   MCV 91.5 12/25/2019   PLT 180 12/25/2019      Chemistry      Component Value Date/Time   NA 141 12/25/2019 0950   K 4.1 12/25/2019 0950   CL 108 12/25/2019 0950   CO2 23 12/25/2019 0950   BUN 16 12/25/2019 0950   CREATININE 0.80 12/25/2019 0950   CREATININE 0.80 02/27/2014 1152      Component Value Date/Time   CALCIUM 9.6 12/25/2019 0950   ALKPHOS 88 12/25/2019 0950   AST 18 12/25/2019 0950   ALT 14 12/25/2019 0950   BILITOT 1.1 12/25/2019 0950       RADIOGRAPHIC STUDIES:  No results found.   ASSESSMENT/PLAN:  This is a very pleasant 64 year old Caucasian male diagnosed with stage IIIb non-small cell lung cancer, adenocarcinoma. He presented with a rightlower lobe lung mass and right hilar  lymphadenopathy and ipsilateral and contralateral mediastinal lymphadenopathy. He has no actionable mutations. He was diagnosed in October 2020.  He is status post 7 cycles of concurrent chemoradiation with carboplatin for an AUC of 2 and paclitaxel 45 mg/m2. He experienced odynophagia, dysphagia, and radiation induced esophagitis. He was placed on a prednisone taperand is almost finished with his course.He is feeling better at this time.  Patient is currently undergoing treatment with Imfinzi 1500 mg IV every 4 weeks.Status post4 cycles.  The patient was seen with Dr. Julien Nordmann today.  Dr. Julien Nordmann discussed that the patient's numbness is not likely to be related to his current treatment with immunotherapy with Imfinzi.  His symptoms are likely related to the degenerative disc disease at L2.  The patient was advised to reach out to his primary care provider if his symptoms are worsening, although he is very active without significant limitations. He will proceed with cycle #5 today as scheduled.   We will see him back for a follow up visit in 4 weeks for evaluation before starting cycle #6.   The patient was advised to call immediately if he has any concerning symptoms in the interval. The patient voices understanding of current disease status and treatment options and is in agreement with the current care plan. All questions were answered. The patient knows to call the clinic with any problems, questions or concerns. We can certainly see the patient much sooner if necessary      No orders of the defined types were placed in this encounter.    Joana Nolton L Tyjah Hai, PA-C 12/25/19  ADDENDUM: Hematology/Oncology Attending: I had a face-to-face encounter with the patient today.  I recommended his care plan.  This is a very pleasant 64 years old white male with stage IIIb non-small cell lung cancer, adenocarcinoma status post a course of concurrent chemoradiation and he is currently  undergoing consolidation treatment with Imfinzi every 4 weeks is status post 4 cycles. 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  He continues to have mild dry cough. I recommended for the patient to continue his current treatment with Imfinzi and he will proceed with cycle #5 today. He will come back for follow-up visit in 4 weeks for evaluation before the next cycle of  his treatment. He was advised to call immediately if he has any concerning symptoms in the interval.  Disclaimer: This note was dictated with voice recognition software. Similar sounding words can inadvertently be transcribed and may be missed upon review. Eilleen Kempf, MD 12/25/19

## 2019-12-25 ENCOUNTER — Encounter: Payer: Self-pay | Admitting: Physician Assistant

## 2019-12-25 ENCOUNTER — Other Ambulatory Visit: Payer: Self-pay

## 2019-12-25 ENCOUNTER — Inpatient Hospital Stay: Payer: Federal, State, Local not specified - PPO

## 2019-12-25 ENCOUNTER — Inpatient Hospital Stay: Payer: Federal, State, Local not specified - PPO | Attending: Internal Medicine | Admitting: Physician Assistant

## 2019-12-25 VITALS — BP 101/76 | HR 86 | Temp 98.0°F | Resp 18 | Ht 76.0 in | Wt 183.9 lb

## 2019-12-25 DIAGNOSIS — C3431 Malignant neoplasm of lower lobe, right bronchus or lung: Secondary | ICD-10-CM | POA: Insufficient documentation

## 2019-12-25 DIAGNOSIS — Z5112 Encounter for antineoplastic immunotherapy: Secondary | ICD-10-CM

## 2019-12-25 DIAGNOSIS — C3491 Malignant neoplasm of unspecified part of right bronchus or lung: Secondary | ICD-10-CM

## 2019-12-25 DIAGNOSIS — Z79899 Other long term (current) drug therapy: Secondary | ICD-10-CM | POA: Diagnosis not present

## 2019-12-25 LAB — CBC WITH DIFFERENTIAL (CANCER CENTER ONLY)
Abs Immature Granulocytes: 0.02 10*3/uL (ref 0.00–0.07)
Basophils Absolute: 0 10*3/uL (ref 0.0–0.1)
Basophils Relative: 1 %
Eosinophils Absolute: 0.2 10*3/uL (ref 0.0–0.5)
Eosinophils Relative: 5 %
HCT: 43.1 % (ref 39.0–52.0)
Hemoglobin: 14.6 g/dL (ref 13.0–17.0)
Immature Granulocytes: 0 %
Lymphocytes Relative: 11 %
Lymphs Abs: 0.5 10*3/uL — ABNORMAL LOW (ref 0.7–4.0)
MCH: 31 pg (ref 26.0–34.0)
MCHC: 33.9 g/dL (ref 30.0–36.0)
MCV: 91.5 fL (ref 80.0–100.0)
Monocytes Absolute: 0.5 10*3/uL (ref 0.1–1.0)
Monocytes Relative: 11 %
Neutro Abs: 3.6 10*3/uL (ref 1.7–7.7)
Neutrophils Relative %: 72 %
Platelet Count: 180 10*3/uL (ref 150–400)
RBC: 4.71 MIL/uL (ref 4.22–5.81)
RDW: 12.2 % (ref 11.5–15.5)
WBC Count: 5 10*3/uL (ref 4.0–10.5)
nRBC: 0 % (ref 0.0–0.2)

## 2019-12-25 LAB — CMP (CANCER CENTER ONLY)
ALT: 14 U/L (ref 0–44)
AST: 18 U/L (ref 15–41)
Albumin: 4 g/dL (ref 3.5–5.0)
Alkaline Phosphatase: 88 U/L (ref 38–126)
Anion gap: 10 (ref 5–15)
BUN: 16 mg/dL (ref 8–23)
CO2: 23 mmol/L (ref 22–32)
Calcium: 9.6 mg/dL (ref 8.9–10.3)
Chloride: 108 mmol/L (ref 98–111)
Creatinine: 0.8 mg/dL (ref 0.61–1.24)
GFR, Est AFR Am: >60 mL/min (ref >60)
GFR, Estimated: >60 mL/min (ref >60)
Glucose, Bld: 88 mg/dL (ref 70–99)
Potassium: 4.1 mmol/L (ref 3.5–5.1)
Sodium: 141 mmol/L (ref 135–145)
Total Bilirubin: 1.1 mg/dL (ref 0.3–1.2)
Total Protein: 6.9 g/dL (ref 6.5–8.1)

## 2019-12-25 LAB — TSH: TSH: 1.993 u[IU]/mL (ref 0.320–4.118)

## 2019-12-25 MED ORDER — SODIUM CHLORIDE 0.9 % IV SOLN
1500.0000 mg | Freq: Once | INTRAVENOUS | Status: AC
  Administered 2019-12-25: 1500 mg via INTRAVENOUS
  Filled 2019-12-25: qty 30

## 2019-12-25 MED ORDER — SODIUM CHLORIDE 0.9 % IV SOLN
Freq: Once | INTRAVENOUS | Status: AC
  Filled 2019-12-25: qty 250

## 2019-12-25 NOTE — Patient Instructions (Signed)
[facility] Discharge Instructions for Patients Receiving Chemotherapy  Today you received the following chemotherapy agents: Imfinzi.  To help prevent nausea and vomiting after your treatment, we encourage you to take your nausea medication as directed.   If you develop nausea and vomiting that is not controlled by your nausea medication, call the clinic.   BELOW ARE SYMPTOMS THAT SHOULD BE REPORTED IMMEDIATELY:  *FEVER GREATER THAN 100.5 F  *CHILLS WITH OR WITHOUT FEVER  NAUSEA AND VOMITING THAT IS NOT CONTROLLED WITH YOUR NAUSEA MEDICATION  *UNUSUAL SHORTNESS OF BREATH  *UNUSUAL BRUISING OR BLEEDING  TENDERNESS IN MOUTH AND THROAT WITH OR WITHOUT PRESENCE OF ULCERS  *URINARY PROBLEMS  *BOWEL PROBLEMS  UNUSUAL RASH Items with * indicate a potential emergency and should be followed up as soon as possible.  Feel free to call the clinic should you have any questions or concerns. The clinic phone number is [phone].  Please show the CHEMO ALERT CARD at check-in to the Emergency Department and triage nurse.   

## 2019-12-31 ENCOUNTER — Telehealth: Payer: Self-pay | Admitting: Physician Assistant

## 2019-12-31 NOTE — Telephone Encounter (Signed)
Scheduled per los. Called and left msg. Mailed printout  °

## 2020-01-22 ENCOUNTER — Inpatient Hospital Stay: Payer: Federal, State, Local not specified - PPO | Attending: Internal Medicine | Admitting: Internal Medicine

## 2020-01-22 ENCOUNTER — Encounter: Payer: Self-pay | Admitting: Internal Medicine

## 2020-01-22 ENCOUNTER — Inpatient Hospital Stay: Payer: Federal, State, Local not specified - PPO

## 2020-01-22 ENCOUNTER — Other Ambulatory Visit: Payer: Self-pay

## 2020-01-22 DIAGNOSIS — C3491 Malignant neoplasm of unspecified part of right bronchus or lung: Secondary | ICD-10-CM

## 2020-01-22 DIAGNOSIS — Z5112 Encounter for antineoplastic immunotherapy: Secondary | ICD-10-CM | POA: Diagnosis present

## 2020-01-22 DIAGNOSIS — Z79899 Other long term (current) drug therapy: Secondary | ICD-10-CM | POA: Insufficient documentation

## 2020-01-22 DIAGNOSIS — C3490 Malignant neoplasm of unspecified part of unspecified bronchus or lung: Secondary | ICD-10-CM

## 2020-01-22 DIAGNOSIS — C3431 Malignant neoplasm of lower lobe, right bronchus or lung: Secondary | ICD-10-CM | POA: Insufficient documentation

## 2020-01-22 LAB — CMP (CANCER CENTER ONLY)
ALT: 17 U/L (ref 0–44)
AST: 18 U/L (ref 15–41)
Albumin: 4.1 g/dL (ref 3.5–5.0)
Alkaline Phosphatase: 92 U/L (ref 38–126)
Anion gap: 10 (ref 5–15)
BUN: 18 mg/dL (ref 8–23)
CO2: 23 mmol/L (ref 22–32)
Calcium: 9.8 mg/dL (ref 8.9–10.3)
Chloride: 107 mmol/L (ref 98–111)
Creatinine: 0.81 mg/dL (ref 0.61–1.24)
GFR, Est AFR Am: >60 mL/min (ref >60)
GFR, Estimated: >60 mL/min (ref >60)
Glucose, Bld: 99 mg/dL (ref 70–99)
Potassium: 4.3 mmol/L (ref 3.5–5.1)
Sodium: 140 mmol/L (ref 135–145)
Total Bilirubin: 0.9 mg/dL (ref 0.3–1.2)
Total Protein: 6.7 g/dL (ref 6.5–8.1)

## 2020-01-22 LAB — CBC WITH DIFFERENTIAL (CANCER CENTER ONLY)
Abs Immature Granulocytes: 0.02 10*3/uL (ref 0.00–0.07)
Basophils Absolute: 0 10*3/uL (ref 0.0–0.1)
Basophils Relative: 1 %
Eosinophils Absolute: 0.2 10*3/uL (ref 0.0–0.5)
Eosinophils Relative: 4 %
HCT: 42.2 % (ref 39.0–52.0)
Hemoglobin: 14.4 g/dL (ref 13.0–17.0)
Immature Granulocytes: 0 %
Lymphocytes Relative: 12 %
Lymphs Abs: 0.6 10*3/uL — ABNORMAL LOW (ref 0.7–4.0)
MCH: 31.2 pg (ref 26.0–34.0)
MCHC: 34.1 g/dL (ref 30.0–36.0)
MCV: 91.5 fL (ref 80.0–100.0)
Monocytes Absolute: 0.5 10*3/uL (ref 0.1–1.0)
Monocytes Relative: 10 %
Neutro Abs: 3.7 10*3/uL (ref 1.7–7.7)
Neutrophils Relative %: 73 %
Platelet Count: 179 10*3/uL (ref 150–400)
RBC: 4.61 MIL/uL (ref 4.22–5.81)
RDW: 12.2 % (ref 11.5–15.5)
WBC Count: 5.1 10*3/uL (ref 4.0–10.5)
nRBC: 0 % (ref 0.0–0.2)

## 2020-01-22 LAB — TSH: TSH: 2.421 u[IU]/mL (ref 0.320–4.118)

## 2020-01-22 MED ORDER — SODIUM CHLORIDE 0.9 % IV SOLN
Freq: Once | INTRAVENOUS | Status: AC
  Filled 2020-01-22: qty 250

## 2020-01-22 MED ORDER — SODIUM CHLORIDE 0.9 % IV SOLN
1500.0000 mg | Freq: Once | INTRAVENOUS | Status: AC
  Administered 2020-01-22: 1500 mg via INTRAVENOUS
  Filled 2020-01-22: qty 30

## 2020-01-22 NOTE — Patient Instructions (Signed)
[facility] Discharge Instructions for Patients Receiving Chemotherapy  Today you received the following chemotherapy agents: Imfinzi.  To help prevent nausea and vomiting after your treatment, we encourage you to take your nausea medication as directed.   If you develop nausea and vomiting that is not controlled by your nausea medication, call the clinic.   BELOW ARE SYMPTOMS THAT SHOULD BE REPORTED IMMEDIATELY:  *FEVER GREATER THAN 100.5 F  *CHILLS WITH OR WITHOUT FEVER  NAUSEA AND VOMITING THAT IS NOT CONTROLLED WITH YOUR NAUSEA MEDICATION  *UNUSUAL SHORTNESS OF BREATH  *UNUSUAL BRUISING OR BLEEDING  TENDERNESS IN MOUTH AND THROAT WITH OR WITHOUT PRESENCE OF ULCERS  *URINARY PROBLEMS  *BOWEL PROBLEMS  UNUSUAL RASH Items with * indicate a potential emergency and should be followed up as soon as possible.  Feel free to call the clinic should you have any questions or concerns. The clinic phone number is [phone].  Please show the CHEMO ALERT CARD at check-in to the Emergency Department and triage nurse.   

## 2020-01-22 NOTE — Progress Notes (Signed)
[place] Telephone:([phone]   Fax:(336) [phone]  OFFICE PROGRESS NOTE  [name], L.[name], [place] [address] [place] [address]  DIAGNOSIS: stage IIIb (T2b, N3, M0) non-small cell lung cancer, adenocarcinoma presented with right lower lobe lung nodule in addition to right hilar mass/node as well as ipsilateral and contralateral mediastinal lymphadenopathy diagnosed in October 2020.  Molecular studies by Guardant 360 showed no actionable mutations.  PRIOR THERAPY: Concurrent chemoradiation with weekly carboplatin for AUC of 2 and paclitaxel 45 mg/M2.  Status post 7 cycles.  Last dose was given July 21, 2019  CURRENT THERAPY: Consolidation treatment with immunotherapy with Imfinzi 1500 mg.  Status post 5 cycles.  INTERVAL HISTORY: Alan [name] 64 y.o. male returns to the clinic today for follow-up visit.  The patient is feeling fine today with no concerning complaints.  He exercises at regular basis.  He took a spin class earlier today.  He denied having any current chest pain, shortness of breath, cough or hemoptysis.  He denied having any fever or chills.  He has no nausea, vomiting, diarrhea or constipation.  He has no headache or visual changes.  He continues to tolerate his treatment with Imfinzi fairly well.  The patient is here today for evaluation before starting cycle #6.  MEDICAL HISTORY: Past Medical History:  Diagnosis Date  . CAD S/P percutaneous coronary angioplasty 2004   which showed essentially normal LV size and function, moderately dilated left atrium, moderate mitral prolapse with mild to moderate regurgitation.  . Dyslipidemia, goal LDL below 70   . Essential hypertension   . GERD (gastroesophageal reflux disease)   . History of Mitral valve prolapse    Moderate - with moderate MR, noted February t 2013  . History of Severe mitral regurgitation by prior echocardiogram 02/06/2014   TEE: Severe mitral regurgitation with  a flail P2 segment and ruptured; Normal LV size & function, dilated LA.  Marland Kitchen History of stress test 12/2009   he walked 9 mins reaching 10 METS. There was an attenuation artifact in the inferior region but no ischemia or infaract, low risk.  . Incidental lung nodule, greater than or equal to 7mm 03/16/2014   Ground glass opacity RML noted on CT scan  . lung ca dx'd 03/2019  . PONV (postoperative nausea and vomiting)   . 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 ST elevation myocardial infarction (STEMI) of anterior wall, subsequent episode of care [facility]) 2004   he had a proximal LAD occlusion treated with 2 overlapping 3.5 x 1.8 mm Cypher DES stents.    ALLERGIES:  is allergic to a-cillin [ampicillin], amoxicillin, other, penicillins, and sulfa antibiotics.  MEDICATIONS:  Current Outpatient Medications  Medication Sig Dispense Refill  . Ascorbic Acid (VITAMIN C) 1000 MG tablet Take 2,000 mg by mouth daily.     . Coenzyme Q10 (CO Q 10) 100 MG CAPS Take 100 mg by mouth daily.     Marland Kitchen MAGNESIUM PO Take 1 tablet by mouth at bedtime.    . Melatonin 10 MG TABS Take 10 mg by mouth at bedtime.    . prochlorperazine (COMPAZINE) 10 MG tablet Take 1 tablet (10 mg total) by mouth every 6 (six) hours as needed  for nausea or vomiting. (Patient not taking: Reported on 11/27/2019) 30 tablet 0  . Specialty Vitamins Products (PROSTATE PO) Take 1 capsule by mouth daily. PROSTATE FORMULA    . Vitamin D-Vitamin K (D3 + K2 DOTS PO) Take 1 tablet by mouth daily.     No current facility-administered medications for this visit.    SURGICAL HISTORY:  Past Surgical History:  Procedure Laterality Date  . ANTERIOR CRUCIATE LIGAMENT REPAIR Left 1993    . CARDIAC CATHETERIZATION  2004   he had a proximal LAD occlusion treated with 2 overlapping 3.5 x 1.8 mm Cypher DES stents.  Marland Kitchen CARDIAC CATHETERIZATION  2007; 03/2014   Widely patent LAD stents, 40% distal LAD, 30% Cx, ~50% PL-PDA bifurcation lesion   . COLONOSCOPY    . INTRAOPERATIVE TRANSESOPHAGEAL ECHOCARDIOGRAM N/A 04/08/2014   Procedure: INTRAOPERATIVE TRANSESOPHAGEAL ECHOCARDIOGRAM;  Surgeon: Rexene Alberts, MD;  Location: [place];  Service: Open Heart Surgery;  Laterality: N/A;  . LEFT AND RIGHT HEART CATHETERIZATION WITH CORONARY ANGIOGRAM N/A 03/05/2014   Procedure: LEFT AND RIGHT HEART CATHETERIZATION WITH CORONARY ANGIOGRAM;  Surgeon: Leonie Man, MD;  Location[facility] C[facility] LAB;  Service: Cardiovascular;  Laterality: N/A;  . LEFT HEART CATHETERIZATION WITH CORONARY ANGIOGRAM N/A 09/15/2011   Procedure: LEFT HEART CATHETERIZATION WITH CORONARY ANGIOGRAM;  Surgeon: Lorretta Harp, MD;  Location: [facility] CATH LAB;  Service: Cardiovascular;  Laterality: N/A;  . MITRAL VALVE REPAIR Right 04/08/2014   Procedure: MINIMALLY INVASIVE MITRAL VALVE REPAIR (MVR);  Surgeon: Rexene Alberts, MD;  Location: [place];  Service: Open Heart Surgery;  Laterality: Right;  . NM MYOVIEW LTD  May 2011   Walk 9 minutes, and 10 METs, diaphragmatic attenuation but no ischemia or infarction.  . SVT ABLATION N/A 10/17/2019   Procedure: SVT ABLATION;  Surgeon: Constance Haw, MD;  Location: [place] CV LAB;  Service: Cardiovascular;  Laterality: N/A;  . TEE WITHOUT CARDIOVERSION N/A 02/06/2014   Procedure: TRANSESOPHAGEAL ECHOCARDIOGRAM (TEE);  Surgeon: Pixie Casino, MD;  Normal LV Size U& function - EF 55-60%, no regional WMA.  MV P2 Leaflet is flail with ruptured chord with severe prolapse, anterior leaflet intact.  Severe, eccentric anterior directed MR with dilated LA.  Marland Kitchen TRANSTHORACIC ECHOCARDIOGRAM  07/18/2018   Normal LV size and function.  EF 50-60 %.  Unable to assess diastolic function.  Mitral valve  sewing ring in place.  Mild stenosis with a gradient of 6 mmHg noted. -  Domingo Dimes ECHOCARDIOGRAM  June 30 2015   Low normal LV function with EF 50-55%. GR 1 DD. No MR. Normal gradient of 4 mmHg the mitral valve. No prolapse.  Marland Kitchen VIDEO BRONCHOSCOPY WITH ENDOBRONCHIAL ULTRASOUND N/A 05/16/2019   Procedure: VIDEO BRONCHOSCOPY WITH ENDOBRONCHIAL ULTRASOUND;  Surgeon: Marshell Garfinkel, MD;  Location: [place];  Service: Pulmonary;  Laterality: N/A;    REVIEW OF SYSTEMS:  A comprehensive review of systems was negative except for: Respiratory: positive for cough   PHYSICAL EXAMINATION: General appearance: alert, cooperative and no distress Head: Normocephalic, without obvious abnormality, atraumatic Neck: no adenopathy, no JVD, supple, symmetrical, trachea midline and thyroid not enlarged, symmetric, no tenderness/mass/nodules Lymph nodes: Cervical, supraclavicular, and axillary nodes normal. Resp: rales RUL Back: symmetric, no curvature. ROM normal. No CVA tenderness. Cardio: regular rate and rhythm, S1, S2 normal, no murmur, click, rub or gallop GI: soft, non-tender; bowel sounds normal; no masses,  no organomegaly Extremities: extremities normal, atraumatic, no cyanosis or edema  ECOG PERFORMANCE STATUS: 1 - Symptomatic  but completely ambulatory  Blood pressure 110/80, pulse 84, temperature 97.7 F (36.5 C), temperature source Temporal, resp. rate 17, height 6\' 4"  (1.93 m), weight 185 lb 9.6 oz (84.2 kg), SpO2 98 %.  LABORATORY DATA: Lab Results  Component Value Date   WBC 5.1 01/22/2020   HGB 14.4 01/22/2020   HCT 42.2 01/22/2020   MCV 91.5 01/22/2020   PLT 179 01/22/2020      Chemistry      Component Value Date/Time   NA 140 01/22/2020 1132   K 4.3 01/22/2020 1132   CL 107 01/22/2020 1132   CO2 23 01/22/2020 1132   BUN 18 01/22/2020 1132   CREATININE 0.81 01/22/2020 1132   CREATININE 0.80 02/27/2014 1152      Component Value Date/Time   CALCIUM 9.8 01/22/2020 1132    ALKPHOS 92 01/22/2020 1132   AST 18 01/22/2020 1132   ALT 17 01/22/2020 1132   BILITOT 0.9 01/22/2020 1132       RADIOGRAPHIC STUDIES: No results found.  ASSESSMENT AND PLAN: This is a very pleasant 64 years old white male recently diagnosed with a stage IIIb non-small cell lung cancer, adenocarcinoma with no actionable mutation.  He completed a course of concurrent chemoradiation with weekly carboplatin and paclitaxel status post 7 cycles.   His treatment was complicated with dysphagia, odynophagia secondary to radiation induced esophagitis.  He also has persistent cough and shortness of breath secondary to radiation-induced pneumonitis. He has partial response after this treatment. The patient is currently undergoing treatment with consolidation immunotherapy with Imfinzi 1500 mg IV every 4 weeks is status post 5 cycles. The patient continues to tolerate his treatment well with no concerning adverse effects.  He started feeling much better than the early cycles of his treatment.  I recommended for him to proceed with cycle #6 today as planned. I will see him back for follow-up visit in 4 weeks for evaluation with repeat CT scan of the chest for restaging of his disease. The patient was advised to call immediately if he has any concerning symptoms in the interval. The patient voices understanding of current disease status and treatment options and is in agreement with the current care plan. All questions were answered. The patient knows to call the clinic with any problems, questions or concerns. We can certainly see the patient much sooner if necessary.  The total time spent in the appointment was 35 minutes.  Disclaimer: This note was dictated with voice recognition software. Similar sounding words can inadvertently be transcribed and may not be corrected upon review.

## 2020-01-28 ENCOUNTER — Telehealth: Payer: Self-pay | Admitting: Internal Medicine

## 2020-01-28 NOTE — Telephone Encounter (Signed)
R/s appt per 6/15 sch message - pt is aware of appt date and time

## 2020-02-18 ENCOUNTER — Encounter (HOSPITAL_COMMUNITY): Payer: Self-pay

## 2020-02-18 ENCOUNTER — Ambulatory Visit (HOSPITAL_COMMUNITY)
Admission: RE | Admit: 2020-02-18 | Discharge: 2020-02-18 | Disposition: A | Discharge status: Home / Self Care | Payer: Federal, State, Local not specified - PPO | Source: Ambulatory Visit | Attending: Internal Medicine | Admitting: Internal Medicine

## 2020-02-18 ENCOUNTER — Other Ambulatory Visit: Payer: Self-pay

## 2020-02-18 DIAGNOSIS — C3490 Malignant neoplasm of unspecified part of unspecified bronchus or lung: Secondary | ICD-10-CM | POA: Insufficient documentation

## 2020-02-18 MED ORDER — IOHEXOL 300 MG/ML  SOLN
75.0000 mL | Freq: Once | INTRAMUSCULAR | Status: AC | PRN
  Administered 2020-02-18: 75 mL via INTRAVENOUS

## 2020-02-18 MED ORDER — SODIUM CHLORIDE (PF) 0.9 % IJ SOLN
INTRAMUSCULAR | Status: AC
  Filled 2020-02-18: qty 50

## 2020-02-19 ENCOUNTER — Other Ambulatory Visit: Payer: Self-pay

## 2020-02-19 ENCOUNTER — Encounter: Payer: Self-pay | Admitting: Internal Medicine

## 2020-02-19 ENCOUNTER — Inpatient Hospital Stay: Payer: Federal, State, Local not specified - PPO | Attending: Internal Medicine

## 2020-02-19 ENCOUNTER — Inpatient Hospital Stay: Payer: Federal, State, Local not specified - PPO

## 2020-02-19 ENCOUNTER — Inpatient Hospital Stay (HOSPITAL_BASED_OUTPATIENT_CLINIC_OR_DEPARTMENT_OTHER): Payer: Federal, State, Local not specified - PPO | Admitting: Internal Medicine

## 2020-02-19 VITALS — BP 109/80 | HR 87 | Temp 97.5°F | Resp 17 | Ht 76.0 in | Wt 184.8 lb

## 2020-02-19 DIAGNOSIS — Z79899 Other long term (current) drug therapy: Secondary | ICD-10-CM | POA: Insufficient documentation

## 2020-02-19 DIAGNOSIS — Z5112 Encounter for antineoplastic immunotherapy: Secondary | ICD-10-CM | POA: Insufficient documentation

## 2020-02-19 DIAGNOSIS — I10 Essential (primary) hypertension: Secondary | ICD-10-CM

## 2020-02-19 DIAGNOSIS — C3491 Malignant neoplasm of unspecified part of right bronchus or lung: Secondary | ICD-10-CM | POA: Diagnosis not present

## 2020-02-19 DIAGNOSIS — C3431 Malignant neoplasm of lower lobe, right bronchus or lung: Secondary | ICD-10-CM | POA: Diagnosis not present

## 2020-02-19 LAB — TSH: TSH: 2.184 u[IU]/mL (ref 0.320–4.118)

## 2020-02-19 LAB — CMP (CANCER CENTER ONLY)
ALT: 16 U/L (ref 0–44)
AST: 16 U/L (ref 15–41)
Albumin: 4.1 g/dL (ref 3.5–5.0)
Alkaline Phosphatase: 87 U/L (ref 38–126)
Anion gap: 13 (ref 5–15)
BUN: 15 mg/dL (ref 8–23)
CO2: 22 mmol/L (ref 22–32)
Calcium: 9.8 mg/dL (ref 8.9–10.3)
Chloride: 107 mmol/L (ref 98–111)
Creatinine: 0.79 mg/dL (ref 0.61–1.24)
GFR, Est AFR Am: >60 mL/min (ref >60)
GFR, Estimated: >60 mL/min (ref >60)
Glucose, Bld: 103 mg/dL — ABNORMAL HIGH (ref 70–99)
Potassium: 4.2 mmol/L (ref 3.5–5.1)
Sodium: 142 mmol/L (ref 135–145)
Total Bilirubin: 1.3 mg/dL — ABNORMAL HIGH (ref 0.3–1.2)
Total Protein: 6.9 g/dL (ref 6.5–8.1)

## 2020-02-19 LAB — CBC WITH DIFFERENTIAL (CANCER CENTER ONLY)
Abs Immature Granulocytes: 0.01 10*3/uL (ref 0.00–0.07)
Basophils Absolute: 0 10*3/uL (ref 0.0–0.1)
Basophils Relative: 1 %
Eosinophils Absolute: 0.1 10*3/uL (ref 0.0–0.5)
Eosinophils Relative: 4 %
HCT: 42.1 % (ref 39.0–52.0)
Hemoglobin: 14.4 g/dL (ref 13.0–17.0)
Immature Granulocytes: 0 %
Lymphocytes Relative: 18 %
Lymphs Abs: 0.6 10*3/uL — ABNORMAL LOW (ref 0.7–4.0)
MCH: 31.8 pg (ref 26.0–34.0)
MCHC: 34.2 g/dL (ref 30.0–36.0)
MCV: 92.9 fL (ref 80.0–100.0)
Monocytes Absolute: 0.4 10*3/uL (ref 0.1–1.0)
Monocytes Relative: 11 %
Neutro Abs: 2.3 10*3/uL (ref 1.7–7.7)
Neutrophils Relative %: 66 %
Platelet Count: 187 10*3/uL (ref 150–400)
RBC: 4.53 MIL/uL (ref 4.22–5.81)
RDW: 12 % (ref 11.5–15.5)
WBC Count: 3.5 10*3/uL — ABNORMAL LOW (ref 4.0–10.5)
nRBC: 0 % (ref 0.0–0.2)

## 2020-02-19 MED ORDER — SODIUM CHLORIDE 0.9 % IV SOLN
Freq: Once | INTRAVENOUS | Status: AC
  Filled 2020-02-19: qty 250

## 2020-02-19 MED ORDER — SODIUM CHLORIDE 0.9 % IV SOLN
1500.0000 mg | Freq: Once | INTRAVENOUS | Status: AC
  Administered 2020-02-19: 1500 mg via INTRAVENOUS
  Filled 2020-02-19: qty 30

## 2020-02-19 NOTE — Progress Notes (Signed)
[place] Telephone:(336) [phone]   Fax:(336) [phone]  OFFICE PROGRESS NOTE  [name], L.[name], [place] [address] [place] [address]  DIAGNOSIS: stage IIIb (T2b, N3, M0) non-small cell lung cancer, adenocarcinoma presented with right lower lobe lung nodule in addition to right hilar mass/node as well as ipsilateral and contralateral mediastinal lymphadenopathy diagnosed in October 2020.  Molecular studies by Guardant 360 showed no actionable mutations.  PRIOR THERAPY: Concurrent chemoradiation with weekly carboplatin for AUC of 2 and paclitaxel 45 mg/M2.  Status post 7 cycles.  Last dose was given July 21, 2019  CURRENT THERAPY: Consolidation treatment with immunotherapy with Imfinzi 1500 mg.  Status post 6 cycles.  INTERVAL HISTORY: Alan [name] 64 y.o. male returns to the clinic today for follow-up visit.  The patient is feeling fine today with no concerning complaints except for mild fatigue and shortness of breath with exertion.  He has improvement of his cough.  He is able to play golf as well as spin classes at regular basis.  He denied having any chest pain or hemoptysis.  He denied having any recent weight loss or night sweats.  He has no nausea, vomiting, diarrhea or constipation.  He denied having any skin rash or pruritus.  He continues to tolerate his treatment with Imfinzi fairly well.  The patient is here today for evaluation with repeat CT scan of the chest for restaging of his disease.  MEDICAL HISTORY: Past Medical History:  Diagnosis Date  . CAD S/P percutaneous coronary angioplasty 2004   which showed essentially normal LV size and function, moderately dilated left atrium, moderate mitral prolapse with mild to moderate regurgitation.  . Dyslipidemia, goal LDL below 70   . Essential hypertension   . GERD (gastroesophageal reflux disease)   . History of Mitral valve prolapse    Moderate - with moderate MR, noted February t  2013  . History of Severe mitral regurgitation by prior echocardiogram 02/06/2014   TEE: Severe mitral regurgitation with a flail P2 segment and ruptured; Normal LV size & function, dilated LA.  Marland Kitchen History of stress test 12/2009   he walked 9 mins reaching 10 METS. There was an attenuation artifact in the inferior region but no ischemia or infaract, low risk.  . Incidental lung nodule, greater than or equal to 11mm 03/16/2014   Ground glass opacity RML noted on CT scan  . lung ca dx'd 03/2019  . PONV (postoperative nausea and vomiting)   . 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 ST elevation myocardial infarction (STEMI) of anterior wall, subsequent episode of care [facility]) 2004   he had a proximal LAD occlusion treated with 2 overlapping 3.5 x 1.8 mm Cypher DES stents.    ALLERGIES:  is allergic to a-cillin [ampicillin], amoxicillin, other, penicillins, and sulfa antibiotics.  MEDICATIONS:  Current Outpatient Medications  Medication Sig Dispense Refill  . Ascorbic Acid (VITAMIN C) 1000 MG tablet Take 2,000 mg by mouth daily.     . Coenzyme Q10 (CO Q 10) 100 MG CAPS Take 100 mg by mouth daily.     Marland Kitchen MAGNESIUM PO Take 1 tablet by mouth at bedtime.    . Melatonin 10 MG TABS Take 10 mg by mouth  at bedtime.    . prochlorperazine (COMPAZINE) 10 MG tablet Take 1 tablet (10 mg total) by mouth every 6 (six) hours as needed for nausea or vomiting. (Patient not taking: Reported on 11/27/2019) 30 tablet 0  . Specialty Vitamins Products (PROSTATE PO) Take 1 capsule by mouth daily. PROSTATE FORMULA    . Vitamin D-Vitamin K (D3 + K2 DOTS PO) Take 1 tablet by mouth daily.     No current facility-administered medications for this visit.     SURGICAL HISTORY:  Past Surgical History:  Procedure Laterality Date  . ANTERIOR CRUCIATE LIGAMENT REPAIR Left 1993  . CARDIAC CATHETERIZATION  2004   he had a proximal LAD occlusion treated with 2 overlapping 3.5 x 1.8 mm Cypher DES stents.  Marland Kitchen CARDIAC CATHETERIZATION  2007; 03/2014   Widely patent LAD stents, 40% distal LAD, 30% Cx, ~50% PL-PDA bifurcation lesion   . COLONOSCOPY    . INTRAOPERATIVE TRANSESOPHAGEAL ECHOCARDIOGRAM N/A 04/08/2014   Procedure: INTRAOPERATIVE TRANSESOPHAGEAL ECHOCARDIOGRAM;  Surgeon: Rexene Alberts, MD;  Location: [place];  Service: Open Heart Surgery;  Laterality: N/A;  . LEFT AND RIGHT HEART CATHETERIZATION WITH CORONARY ANGIOGRAM N/A 03/05/2014   Procedure: LEFT AND RIGHT HEART CATHETERIZATION WITH CORONARY ANGIOGRAM;  Surgeon: Leonie Man, MD;  Location: [facility] CATH LAB;  Service: Cardiovascular;  Laterality: N/A;  . LEFT HEART CATHETERIZATION WITH CORONARY ANGIOGRAM N/A 09/15/2011   Procedure: LEFT HEART CATHETERIZATION WITH CORONARY ANGIOGRAM;  Surgeon: Lorretta Harp, MD;  Location: [facility] CATH LAB;  Service: Cardiovascular;  Laterality: N/A;  . MITRAL VALVE REPAIR Right 04/08/2014   Procedure: MINIMALLY INVASIVE MITRAL VALVE REPAIR (MVR);  Surgeon: Rexene Alberts, MD;  Location: [place];  Service: Open Heart Surgery;  Laterality: Right;  . NM MYOVIEW LTD  May 2011   Walk 9 minutes, and 10 METs, diaphragmatic attenuation but no ischemia or infarction.  . SVT ABLATION N/A 10/17/2019   Procedure: SVT ABLATION;  Surgeon: Constance Haw, MD;  Location: [place] CV LAB;  Service: Cardiovascular;  Laterality: N/A;  . TEE WITHOUT CARDIOVERSION N/A 02/06/2014   Procedure: TRANSESOPHAGEAL ECHOCARDIOGRAM (TEE);  Surgeon: Pixie Casino, MD;  Normal LV Size U& function - EF 55-60%, no regional WMA.  MV P2 Leaflet is flail with ruptured chord with severe prolapse, anterior leaflet intact.  Severe, eccentric anterior directed MR with dilated LA.  Marland Kitchen TRANSTHORACIC  ECHOCARDIOGRAM  07/18/2018   Normal LV size and function.  EF 50-60 %.  Unable to assess diastolic function.  Mitral valve sewing ring in place.  Mild stenosis with a gradient of 6 mmHg noted. -  Domingo Dimes ECHOCARDIOGRAM  June 30 2015   Low normal LV function with EF 50-55%. GR 1 DD. No MR. Normal gradient of 4 mmHg the mitral valve. No prolapse.  Marland Kitchen VIDEO BRONCHOSCOPY WITH ENDOBRONCHIAL ULTRASOUND N/A 05/16/2019   Procedure: VIDEO BRONCHOSCOPY WITH ENDOBRONCHIAL ULTRASOUND;  Surgeon: Marshell Garfinkel, MD;  Location: [place];  Service: Pulmonary;  Laterality: N/A;    REVIEW OF SYSTEMS:  Constitutional: positive for fatigue Eyes: negative Ears, nose, mouth, throat, and face: negative Respiratory: positive for cough and dyspnea on exertion Cardiovascular: negative Gastrointestinal: negative Genitourinary:negative Integument/breast: negative Hematologic/lymphatic: negative Musculoskeletal:negative Neurological: negative Behavioral/Psych: negative Endocrine: negative Allergic/Immunologic: negative   PHYSICAL EXAMINATION: General appearance: alert, cooperative and no distress Head: Normocephalic, without obvious abnormality, atraumatic Neck: no adenopathy, no JVD, supple, symmetrical, trachea midline and thyroid not enlarged, symmetric, no tenderness/mass/nodules Lymph nodes: Cervical, supraclavicular, and axillary nodes normal. Resp: clear  to auscultation bilaterally Back: symmetric, no curvature. ROM normal. No CVA tenderness. Cardio: regular rate and rhythm, S1, S2 normal, no murmur, click, rub or gallop GI: soft, non-tender; bowel sounds normal; no masses,  no organomegaly Extremities: extremities normal, atraumatic, no cyanosis or edema Neurologic: Alert and oriented X 3, normal strength and tone. Normal symmetric reflexes. Normal coordination and gait  ECOG PERFORMANCE STATUS: 1 - Symptomatic but completely ambulatory  Blood pressure 109/80, pulse 87, temperature (!) 97.5 F  (36.4 C), temperature source Temporal, resp. rate 17, height 6\' 4"  (1.93 m), weight 184 lb 12.8 oz (83.8 kg), SpO2 98 %.  LABORATORY DATA: Lab Results  Component Value Date   WBC 3.5 (L) 02/19/2020   HGB 14.4 02/19/2020   HCT 42.1 02/19/2020   MCV 92.9 02/19/2020   PLT 187 02/19/2020      Chemistry      Component Value Date/Time   NA 140 01/22/2020 1132   K 4.3 01/22/2020 1132   CL 107 01/22/2020 1132   CO2 23 01/22/2020 1132   BUN 18 01/22/2020 1132   CREATININE 0.81 01/22/2020 1132   CREATININE 0.80 02/27/2014 1152      Component Value Date/Time   CALCIUM 9.8 01/22/2020 1132   ALKPHOS 92 01/22/2020 1132   AST 18 01/22/2020 1132   ALT 17 01/22/2020 1132   BILITOT 0.9 01/22/2020 1132       RADIOGRAPHIC STUDIES: CT Chest W Contrast  Result Date: 02/19/2020 CLINICAL DATA:  Primary Cancer Type: Lung Imaging Indication: Routine surveillance Interval therapy since last imaging? Yes Initial Cancer Diagnosis Date: 05/16/2019; Established by: Biopsy-proven Detailed Pathology: Stage IIIb non-small cell lung cancer, adenocarcinoma. Primary Tumor location: Right lower lobe. Surgeries: Mitral valve repair 2015. Chemotherapy: Yes; Ongoing? No; Most recent administration: 07/21/2019 Immunotherapy?  Yes; Type: Imfinzi; Ongoing? Yes Radiation therapy? Yes; date Range: 06/02/2019-07/22/2019; Target: Right lung EXAM: CT CHEST WITH CONTRAST TECHNIQUE: Multidetector CT imaging of the chest was performed during intravenous contrast administration. CONTRAST:  7mL OMNIPAQUE IOHEXOL 300 MG/ML  SOLN COMPARISON:  Most recent CT chest 11/24/2019.  05/15/2019 PET-CT. FINDINGS: Cardiovascular: Aortic atherosclerosis. Normal heart size. Extensive 3 vessel coronary artery calcifications and/or stents. Mitral valve prosthesis. No pericardial effusion. Mediastinum/Nodes: Slight interval reduction in soft tissue thickening about the right hilum and reduction in size of enlarged discrete pretracheal lymph nodes,  index node measuring 1.1 x 0.8 cm, previously 1.5 x 1.1 cm (series 2, image 77). Thyroid gland, trachea, and esophagus demonstrate no significant findings. Lungs/Pleura: Interval increase in bandlike post radiation consolidation and fibrosis of perihilar and paramedian right lung (series 7, image 88). No pleural effusion or pneumothorax. Upper Abdomen: No acute abnormality. Musculoskeletal: No chest wall mass or suspicious bone lesions identified. IMPRESSION: 1. Slight interval reduction in soft tissue thickening about the right hilum and reduction in size of enlarged discrete pretracheal lymph nodes. Findings are consistent with treatment response. 2. Interval increase in bandlike post radiation consolidation and fibrosis of perihilar and paramedian right lung, consistent with developing radiation change. 3. Coronary artery disease.  Aortic Atherosclerosis (ICD10-I70.0). Electronically Signed   By: Eddie Candle M.D.   On: 02/19/2020 10:01    ASSESSMENT AND PLAN: This is a very pleasant 64 years old white male recently diagnosed with a stage IIIb non-small cell lung cancer, adenocarcinoma with no actionable mutation.  He completed a course of concurrent chemoradiation with weekly carboplatin and paclitaxel status post 7 cycles.   His treatment was complicated with dysphagia, odynophagia secondary to radiation induced esophagitis.  He also has persistent cough and shortness of breath secondary to radiation-induced pneumonitis. He has partial response after this treatment. The patient is currently undergoing treatment with consolidation immunotherapy with Imfinzi 1500 mg IV every 4 weeks is status post 6 cycles. The patient continues to tolerate his treatment well with no concerning adverse effects. He also has improvement in his general condition over the last few weeks. He had repeat CT scan of the chest performed recently.  I personally and independently reviewed the scans and discussed the results with  the patient today and showed him the images. His scan showed further improvement of his disease with no concerning findings for progression. I recommended for the patient to continue with his current consolidation treatment with Imfinzi and he will proceed with cycle #7 today. I will see the patient back for follow-up visit in 4 weeks for evaluation before the next cycle of his treatment. The patient was advised to call immediately if he has any concerning symptoms in the interval. The patient voices understanding of current disease status and treatment options and is in agreement with the current care plan. All questions were answered. The patient knows to call the clinic with any problems, questions or concerns. We can certainly see the patient much sooner if necessary.  The total time spent in the appointment was 35 minutes.  Disclaimer: This note was dictated with voice recognition software. Similar sounding words can inadvertently be transcribed and may not be corrected upon review.

## 2020-02-20 ENCOUNTER — Ambulatory Visit: Payer: Federal, State, Local not specified - PPO | Admitting: Physician Assistant

## 2020-02-20 ENCOUNTER — Other Ambulatory Visit: Payer: Federal, State, Local not specified - PPO

## 2020-02-20 ENCOUNTER — Ambulatory Visit: Payer: Federal, State, Local not specified - PPO

## 2020-02-25 ENCOUNTER — Telehealth: Payer: Self-pay | Admitting: Internal Medicine

## 2020-02-25 LAB — LIPID PANEL
Chol/HDL Ratio: 4.5 ratio (ref 0.0–5.0)
Cholesterol, Total: 221 mg/dL — ABNORMAL HIGH (ref 100–199)
HDL: 49 mg/dL (ref >39)
LDL Chol Calc (NIH): 154 mg/dL — ABNORMAL HIGH (ref 0–99)
Triglycerides: 98 mg/dL (ref 0–149)
VLDL Cholesterol Cal: 18 mg/dL (ref 5–40)

## 2020-02-25 LAB — COMPREHENSIVE METABOLIC PANEL
ALT: 13 IU/L (ref 0–44)
AST: 19 IU/L (ref 0–40)
Albumin/Globulin Ratio: 2.4 — ABNORMAL HIGH (ref 1.2–2.2)
Albumin: 4.6 g/dL (ref 3.8–4.8)
Alkaline Phosphatase: 89 IU/L (ref 48–121)
BUN/Creatinine Ratio: 18 (ref 10–24)
BUN: 14 mg/dL (ref 8–27)
Bilirubin Total: 0.9 mg/dL (ref 0.0–1.2)
CO2: 22 mmol/L (ref 20–29)
Calcium: 9.7 mg/dL (ref 8.6–10.2)
Chloride: 105 mmol/L (ref 96–106)
Creatinine, Ser: 0.78 mg/dL (ref 0.76–1.27)
GFR calc Af Amer: 111 mL/min/{1.73_m2} (ref >59)
GFR calc non Af Amer: 96 mL/min/{1.73_m2} (ref >59)
Globulin, Total: 1.9 g/dL (ref 1.5–4.5)
Glucose: 91 mg/dL (ref 65–99)
Potassium: 4.2 mmol/L (ref 3.5–5.2)
Sodium: 141 mmol/L (ref 134–144)
Total Protein: 6.5 g/dL (ref 6.0–8.5)

## 2020-02-25 NOTE — Telephone Encounter (Signed)
Release: [id number] Faxed medical records to [place] @ [phone]

## 2020-03-10 ENCOUNTER — Telehealth: Payer: Self-pay

## 2020-03-10 NOTE — Telephone Encounter (Signed)
Called to schedule the pt for a lipid clinic appt and they declined. They stated that they were recovering from lung cancer and his body has been through to much to consider adding anything else. I will route to Dr. Ellyn Hack and Ms. Trixie Dredge his RN.

## 2020-03-10 NOTE — Telephone Encounter (Signed)
-----   Message from Harrington Challenger, RPH-CPP sent at 03/10/2020 10:33 AM EDT ----- Please schedule appointment with Lipid Clinic per DR Harding's request

## 2020-03-11 NOTE — Telephone Encounter (Signed)
OK ° °DH °

## 2020-03-18 ENCOUNTER — Inpatient Hospital Stay: Payer: Federal, State, Local not specified - PPO

## 2020-03-18 ENCOUNTER — Inpatient Hospital Stay: Payer: Federal, State, Local not specified - PPO | Attending: Internal Medicine | Admitting: Internal Medicine

## 2020-03-18 ENCOUNTER — Other Ambulatory Visit: Payer: Self-pay

## 2020-03-18 ENCOUNTER — Encounter: Payer: Self-pay | Admitting: Internal Medicine

## 2020-03-18 VITALS — BP 110/81 | HR 87 | Temp 97.7°F | Resp 16 | Ht 76.0 in | Wt 186.0 lb

## 2020-03-18 DIAGNOSIS — C3491 Malignant neoplasm of unspecified part of right bronchus or lung: Secondary | ICD-10-CM

## 2020-03-18 DIAGNOSIS — Z79899 Other long term (current) drug therapy: Secondary | ICD-10-CM | POA: Insufficient documentation

## 2020-03-18 DIAGNOSIS — C3431 Malignant neoplasm of lower lobe, right bronchus or lung: Secondary | ICD-10-CM | POA: Insufficient documentation

## 2020-03-18 DIAGNOSIS — Z5112 Encounter for antineoplastic immunotherapy: Secondary | ICD-10-CM | POA: Insufficient documentation

## 2020-03-18 LAB — CBC WITH DIFFERENTIAL (CANCER CENTER ONLY)
Abs Immature Granulocytes: 0.02 10*3/uL (ref 0.00–0.07)
Basophils Absolute: 0 10*3/uL (ref 0.0–0.1)
Basophils Relative: 1 %
Eosinophils Absolute: 0.1 10*3/uL (ref 0.0–0.5)
Eosinophils Relative: 3 %
HCT: 42.7 % (ref 39.0–52.0)
Hemoglobin: 14.4 g/dL (ref 13.0–17.0)
Immature Granulocytes: 1 %
Lymphocytes Relative: 15 %
Lymphs Abs: 0.6 10*3/uL — ABNORMAL LOW (ref 0.7–4.0)
MCH: 31.2 pg (ref 26.0–34.0)
MCHC: 33.7 g/dL (ref 30.0–36.0)
MCV: 92.6 fL (ref 80.0–100.0)
Monocytes Absolute: 0.4 10*3/uL (ref 0.1–1.0)
Monocytes Relative: 10 %
Neutro Abs: 2.7 10*3/uL (ref 1.7–7.7)
Neutrophils Relative %: 70 %
Platelet Count: 171 10*3/uL (ref 150–400)
RBC: 4.61 MIL/uL (ref 4.22–5.81)
RDW: 12.1 % (ref 11.5–15.5)
WBC Count: 3.9 10*3/uL — ABNORMAL LOW (ref 4.0–10.5)
nRBC: 0 % (ref 0.0–0.2)

## 2020-03-18 LAB — CMP (CANCER CENTER ONLY)
ALT: 12 U/L (ref 0–44)
AST: 16 U/L (ref 15–41)
Albumin: 4.1 g/dL (ref 3.5–5.0)
Alkaline Phosphatase: 77 U/L (ref 38–126)
Anion gap: 11 (ref 5–15)
BUN: 17 mg/dL (ref 8–23)
CO2: 21 mmol/L — ABNORMAL LOW (ref 22–32)
Calcium: 10.1 mg/dL (ref 8.9–10.3)
Chloride: 109 mmol/L (ref 98–111)
Creatinine: 0.77 mg/dL (ref 0.61–1.24)
GFR, Est AFR Am: >60 mL/min (ref >60)
GFR, Estimated: >60 mL/min (ref >60)
Glucose, Bld: 105 mg/dL — ABNORMAL HIGH (ref 70–99)
Potassium: 4.2 mmol/L (ref 3.5–5.1)
Sodium: 141 mmol/L (ref 135–145)
Total Bilirubin: 1.1 mg/dL (ref 0.3–1.2)
Total Protein: 6.8 g/dL (ref 6.5–8.1)

## 2020-03-18 LAB — TSH: TSH: 2.572 u[IU]/mL (ref 0.320–4.118)

## 2020-03-18 MED ORDER — SODIUM CHLORIDE 0.9 % IV SOLN
Freq: Once | INTRAVENOUS | Status: AC
  Filled 2020-03-18: qty 250

## 2020-03-18 MED ORDER — SODIUM CHLORIDE 0.9 % IV SOLN
1500.0000 mg | Freq: Once | INTRAVENOUS | Status: AC
  Administered 2020-03-18: 1500 mg via INTRAVENOUS
  Filled 2020-03-18: qty 30

## 2020-03-18 NOTE — Progress Notes (Signed)
[place] [phone]   [phone]  OFFICE PROGRESS NOTE  [name], L.[name], [address] [place] [address]  DIAGNOSIS: stage IIIb (T2b, N3, M0) non-small cell lung cancer, adenocarcinoma presented with right lower lobe lung nodule in addition to right hilar mass/node as well as ipsilateral and contralateral mediastinal lymphadenopathy diagnosed in October 2020.  Molecular studies by [employer] showed no actionable mutations.  PRIOR THERAPY: Concurrent chemoradiation with weekly carboplatin for AUC of 2 and paclitaxel 45 mg/M2.  Status post 7 cycles.  Last dose was given July 21, 2019  CURRENT THERAPY: Consolidation treatment with immunotherapy with Imfinzi 1500 mg.  Status post 7 cycles.  INTERVAL HISTORY: Alan [name] 64 y.o. male returns to the clinic today for follow-up visit.  The patient has no complaints today except for mild fatigue.  He mentions that his stamina and activity is up to 80% of his baseline.  He continues to do spin classes.  He denied having any chest pain but has mild cough and no shortness of breath or hemoptysis.  He has no nausea, vomiting, diarrhea or constipation.  He has no headache or visual changes.  He continues to tolerate his treatment with consolidation Imfinzi fairly well.  The patient is here today for evaluation before starting cycle #8 of his treatment.   MEDICAL HISTORY: Past Medical History:  Diagnosis Date  . CAD S/P percutaneous coronary angioplasty 2004   which showed essentially normal LV size and function, moderately dilated left atrium, moderate mitral prolapse with mild to moderate regurgitation.  . Dyslipidemia, goal LDL below 70   . Essential hypertension   . GERD (gastroesophageal reflux disease)   . History of Mitral valve prolapse    Moderate - with moderate MR, noted February t 2013  . History of Severe mitral regurgitation by prior echocardiogram 02/06/2014    TEE: Severe mitral regurgitation with a flail P2 segment and ruptured; Normal LV size & function, dilated LA.  Marland Kitchen History of stress test 12/2009   he walked 9 mins reaching 10 METS. There was an attenuation artifact in the inferior region but no ischemia or infaract, low risk.  . Incidental lung nodule, greater than or equal to 45mm 03/16/2014   Ground glass opacity RML noted on CT scan  . lung ca dx'd 03/2019  . PONV (postoperative nausea and vomiting)   . 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 ST elevation myocardial infarction (STEMI) of anterior wall, subsequent episode of care [facility]) 2004   he had a proximal LAD occlusion treated with 2 overlapping 3.5 x 1.8 mm Cypher DES stents.    ALLERGIES:  is allergic to a-cillin [ampicillin], amoxicillin, other, penicillins, and sulfa antibiotics.  MEDICATIONS:  Current Outpatient Medications  Medication Sig Dispense Refill  . Ascorbic Acid (VITAMIN C) 1000 MG tablet Take 2,000 mg by mouth daily.     . Coenzyme Q10 (CO Q 10) 100 MG CAPS Take 100 mg by mouth daily.     Marland Kitchen MAGNESIUM PO Take 1 tablet by mouth at bedtime.    . Melatonin 10 MG TABS Take 10 mg by mouth at bedtime.    . prochlorperazine (COMPAZINE) 10 MG tablet Take 1 tablet (10 mg  total) by mouth every 6 (six) hours as needed for nausea or vomiting. (Patient not taking: Reported on 11/27/2019) 30 tablet 0  . Specialty Vitamins Products (PROSTATE PO) Take 1 capsule by mouth daily. PROSTATE FORMULA    . Vitamin D-Vitamin K (D3 + K2 DOTS PO) Take 1 tablet by mouth daily.     No current facility-administered medications for this visit.    SURGICAL HISTORY:  Past Surgical History:  Procedure Laterality Date  . ANTERIOR  CRUCIATE LIGAMENT REPAIR Left 1993  . CARDIAC CATHETERIZATION  2004   he had a proximal LAD occlusion treated with 2 overlapping 3.5 x 1.8 mm Cypher DES stents.  Marland Kitchen CARDIAC CATHETERIZATION  2007; 03/2014   Widely patent LAD stents, 40% distal LAD, 30% Cx, ~50% PL-PDA bifurcation lesion   . COLONOSCOPY    . INTRAOPERATIVE TRANSESOPHAGEAL ECHOCARDIOGRAM N/A 04/08/2014   Procedure: INTRAOPERATIVE TRANSESOPHAGEAL ECHOCARDIOGRAM;  Surgeon: Rexene Alberts, MD;  Location: [facility];  Service: Open Heart Surgery;  Laterality: N/A;  . LEFT AND RIGHT HEART CATHETERIZATION WITH CORONARY ANGIOGRAM N/A 03/05/2014   Procedure: LEFT AND RIGHT HEART CATHETERIZATION WITH CORONARY ANGIOGRAM;  Surgeon: Leonie Man, MD;  Location: [facility] CATH LAB;  Service: Cardiovascular;  Laterality: N/A;  . LEFT HEART CATHETERIZATION WITH CORONARY ANGIOGRAM N/A 09/15/2011   Procedure: LEFT HEART CATHETERIZATION WITH CORONARY ANGIOGRAM;  Surgeon: Lorretta Harp, MD;  Location: [facility] CATH LAB;  Service: Cardiovascular;  Laterality: N/A;  . MITRAL VALVE REPAIR Right 04/08/2014   Procedure: MINIMALLY INVASIVE MITRAL VALVE REPAIR (MVR);  Surgeon: Rexene Alberts, MD;  Location: [facility];  Service: Open Heart Surgery;  Laterality: Right;  . NM MYOVIEW LTD  May 2011   Walk 9 minutes, and 10 METs, diaphragmatic attenuation but no ischemia or infarction.  . SVT ABLATION N/A 10/17/2019   Procedure: SVT ABLATION;  Surgeon: Constance Haw, MD;  Location: [facility] CV LAB;  Service: Cardiovascular;  Laterality: N/A;  . TEE WITHOUT CARDIOVERSION N/A 02/06/2014   Procedure: TRANSESOPHAGEAL ECHOCARDIOGRAM (TEE);  Surgeon: Pixie Casino, MD;  Normal LV Size U& function - EF 55-60%, no regional WMA.  MV P2 Leaflet is flail with ruptured chord with severe prolapse, anterior leaflet intact.  Severe, eccentric anterior directed MR with dilated LA.  Marland Kitchen TRANSTHORACIC ECHOCARDIOGRAM  07/18/2018   Normal LV size and function.  EF 50-60 %.  Unable to assess  diastolic function.  Mitral valve sewing ring in place.  Mild stenosis with a gradient of 6 mmHg noted. -  Domingo Dimes ECHOCARDIOGRAM  June 30 2015   Low normal LV function with EF 50-55%. GR 1 DD. No MR. Normal gradient of 4 mmHg the mitral valve. No prolapse.  Marland Kitchen VIDEO BRONCHOSCOPY WITH ENDOBRONCHIAL ULTRASOUND N/A 05/16/2019   Procedure: VIDEO BRONCHOSCOPY WITH ENDOBRONCHIAL ULTRASOUND;  Surgeon: Marshell Garfinkel, MD;  Location: [facility];  Service: Pulmonary;  Laterality: N/A;    REVIEW OF SYSTEMS:  A comprehensive review of systems was negative except for: Constitutional: positive for fatigue Respiratory: positive for cough   PHYSICAL EXAMINATION: General appearance: alert, cooperative and no distress Head: Normocephalic, without obvious abnormality, atraumatic Neck: no adenopathy, no JVD, supple, symmetrical, trachea midline and thyroid not enlarged, symmetric, no tenderness/mass/nodules Lymph nodes: Cervical, supraclavicular, and axillary nodes normal. Resp: clear to auscultation bilaterally Back: symmetric, no curvature. ROM normal. No CVA tenderness. Cardio: regular rate and rhythm, S1, S2 normal, no murmur, click, rub or gallop GI: soft, non-tender; bowel sounds normal; no masses,  no organomegaly Extremities:  extremities normal, atraumatic, no cyanosis or edema  ECOG PERFORMANCE STATUS: 1 - Symptomatic but completely ambulatory  Blood pressure 110/81, pulse 87, temperature 97.7 F (36.5 C), temperature source Temporal, resp. rate 16, height 6\' 4"  (1.93 m), weight 186 lb (84.4 kg), SpO2 99 %.  LABORATORY DATA: Lab Results  Component Value Date   WBC 3.9 (L) 03/18/2020   HGB 14.4 03/18/2020   HCT 42.7 03/18/2020   MCV 92.6 03/18/2020   PLT 171 03/18/2020      Chemistry      Component Value Date/Time   NA 141 02/25/2020 0915   K 4.2 02/25/2020 0915   CL 105 02/25/2020 0915   CO2 22 02/25/2020 0915   BUN 14 02/25/2020 0915   CREATININE 0.78 02/25/2020 0915    CREATININE 0.79 02/19/2020 1030   CREATININE 0.80 02/27/2014 1152      Component Value Date/Time   CALCIUM 9.7 02/25/2020 0915   ALKPHOS 89 02/25/2020 0915   AST 19 02/25/2020 0915   AST 16 02/19/2020 1030   ALT 13 02/25/2020 0915   ALT 16 02/19/2020 1030   BILITOT 0.9 02/25/2020 0915   BILITOT 1.3 (H) 02/19/2020 1030       RADIOGRAPHIC STUDIES: CT Chest W Contrast  Result Date: 02/19/2020 CLINICAL DATA:  Primary Cancer Type: Lung Imaging Indication: Routine surveillance Interval therapy since last imaging? Yes Initial Cancer Diagnosis Date: 05/16/2019; Established by: Biopsy-proven Detailed Pathology: Stage IIIb non-small cell lung cancer, adenocarcinoma. Primary Tumor location: Right lower lobe. Surgeries: Mitral valve repair 2015. Chemotherapy: Yes; Ongoing? No; Most recent administration: 07/21/2019 Immunotherapy?  Yes; Type: Imfinzi; Ongoing? Yes Radiation therapy? Yes; date Range: 06/02/2019-07/22/2019; Target: Right lung EXAM: CT CHEST WITH CONTRAST TECHNIQUE: Multidetector CT imaging of the chest was performed during intravenous contrast administration. CONTRAST:  39mL OMNIPAQUE IOHEXOL 300 MG/ML  SOLN COMPARISON:  Most recent CT chest 11/24/2019.  05/15/2019 PET-CT. FINDINGS: Cardiovascular: Aortic atherosclerosis. Normal heart size. Extensive 3 vessel coronary artery calcifications and/or stents. Mitral valve prosthesis. No pericardial effusion. Mediastinum/Nodes: Slight interval reduction in soft tissue thickening about the right hilum and reduction in size of enlarged discrete pretracheal lymph nodes, index node measuring 1.1 x 0.8 cm, previously 1.5 x 1.1 cm (series 2, image 77). Thyroid gland, trachea, and esophagus demonstrate no significant findings. Lungs/Pleura: Interval increase in bandlike post radiation consolidation and fibrosis of perihilar and paramedian right lung (series 7, image 88). No pleural effusion or pneumothorax. Upper Abdomen: No acute abnormality.  Musculoskeletal: No chest wall mass or suspicious bone lesions identified. IMPRESSION: 1. Slight interval reduction in soft tissue thickening about the right hilum and reduction in size of enlarged discrete pretracheal lymph nodes. Findings are consistent with treatment response. 2. Interval increase in bandlike post radiation consolidation and fibrosis of perihilar and paramedian right lung, consistent with developing radiation change. 3. Coronary artery disease.  Aortic Atherosclerosis (ICD10-I70.0). Electronically Signed   By: Eddie Candle M.D.   On: 02/19/2020 10:01    ASSESSMENT AND PLAN: This is a very pleasant 64 years old white male recently diagnosed with a stage IIIb non-small cell lung cancer, adenocarcinoma with no actionable mutation.  He completed a course of concurrent chemoradiation with weekly carboplatin and paclitaxel status post 7 cycles.   His treatment was complicated with dysphagia, odynophagia secondary to radiation induced esophagitis.  He also has persistent cough and shortness of breath secondary to radiation-induced pneumonitis. He has partial response after this treatment. The patient is currently undergoing treatment with consolidation immunotherapy with Imfinzi 1500  mg IV every 4 weeks is status post 7 cycles. The patient continues to tolerate his treatment well with no concerning adverse effects. I recommended for him to proceed with cycle #8 today as planned. He will come back for follow-up visit in 4 weeks for evaluation before the next cycle of his treatment. The patient was advised to call immediately if he has any concerning symptoms in the interval. The patient voices understanding of current disease status and treatment options and is in agreement with the current care plan. All questions were answered. The patient knows to call the clinic with any problems, questions or concerns. We can certainly see the patient much sooner if necessary.  Disclaimer: This note  was dictated with voice recognition software. Similar sounding words can inadvertently be transcribed and may not be corrected upon review.

## 2020-03-18 NOTE — Patient Instructions (Signed)
[facility] Discharge Instructions for Patients Receiving Chemotherapy  Today you received the following chemotherapy agents: Imfinzi  To help prevent nausea and vomiting after your treatment, we encourage you to take your nausea medication as prescribed.    If you develop nausea and vomiting that is not controlled by your nausea medication, call the clinic.   BELOW ARE SYMPTOMS THAT SHOULD BE REPORTED IMMEDIATELY:  *FEVER GREATER THAN 100.5 F  *CHILLS WITH OR WITHOUT FEVER  NAUSEA AND VOMITING THAT IS NOT CONTROLLED WITH YOUR NAUSEA MEDICATION  *UNUSUAL SHORTNESS OF BREATH  *UNUSUAL BRUISING OR BLEEDING  TENDERNESS IN MOUTH AND THROAT WITH OR WITHOUT PRESENCE OF ULCERS  *URINARY PROBLEMS  *BOWEL PROBLEMS  UNUSUAL RASH Items with * indicate a potential emergency and should be followed up as soon as possible.  Feel free to call the clinic should you have any questions or concerns. The clinic phone number is [phone].  Please show the [facility] at check-in to the Emergency Department and triage nurse.

## 2020-03-19 ENCOUNTER — Telehealth: Payer: Self-pay | Admitting: Internal Medicine

## 2020-03-19 NOTE — Telephone Encounter (Signed)
Scheduled per los. Called and spoke with patient. Confirmed appts  

## 2020-03-29 ENCOUNTER — Ambulatory Visit: Payer: Federal, State, Local not specified - PPO | Admitting: Cardiology

## 2020-03-29 ENCOUNTER — Encounter: Payer: Self-pay | Admitting: Cardiology

## 2020-03-29 ENCOUNTER — Other Ambulatory Visit: Payer: Self-pay

## 2020-03-29 VITALS — BP 100/74 | HR 75 | Ht 76.0 in | Wt 184.4 lb

## 2020-03-29 DIAGNOSIS — E785 Hyperlipidemia, unspecified: Secondary | ICD-10-CM

## 2020-03-29 DIAGNOSIS — I471 Supraventricular tachycardia: Secondary | ICD-10-CM

## 2020-03-29 DIAGNOSIS — I2510 Atherosclerotic heart disease of native coronary artery without angina pectoris: Secondary | ICD-10-CM | POA: Diagnosis not present

## 2020-03-29 DIAGNOSIS — Z9861 Coronary angioplasty status: Secondary | ICD-10-CM | POA: Diagnosis not present

## 2020-03-29 DIAGNOSIS — I341 Nonrheumatic mitral (valve) prolapse: Secondary | ICD-10-CM

## 2020-03-29 MED ORDER — ROSUVASTATIN CALCIUM 10 MG PO TABS
10.0000 mg | ORAL_TABLET | Freq: Every day | ORAL | 3 refills | Status: DC
Start: 2020-03-29 — End: 2020-09-20

## 2020-03-29 NOTE — Progress Notes (Signed)
Primary Care Provider: [name], L.[name], MD Cardiologist: Glenetta Hew, MD Electrophysiologist: None  Clinic Note: Chief Complaint  Patient presents with  . Follow-up    Doing well.  About 75% back to baseline  . Tachycardia    Status post SVT ablation  . Coronary Artery Disease    No angina  . Hyperlipidemia    Previously reluctant to use statins.    HPI:    Alan [name] is a 64 y.o. 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  Diagnosis with lung cancer June 2019- treated with chemotherapy and XRT.  Alan [name] was last seen by Dr. Curt Bears on November 24, 2019 following SVT (AVNRT) ablation.  He had had several recurrent episodes of SVT dating back to right around Thanksgiving time 2020.  Most recent hospitalization was on September 06, 2019.  Was walking his dog and his heart rate went up to the 190s then 215 bpm.  Converted back to sinus rhythm with 18 mg adenosine.  Was doing well.  No complications of the procedure.  Tolerated meds well.  No further tachycardia spells.  Has returned to exercise.  Dr. 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  Recent Hospitalizations: October 17, 2019-SVT ablation  Reviewed  CV studies:    The following studies were reviewed today: (if available, images/films reviewed: From Epic Chart or Care Everywhere) . 10/17/2019-: Comprehensive EPS involving coronary sinus pacing, mapping of SVT and RFA ablation of SVT -> with no inducible arrhythmia following ablation.  Interval History:   Alan [name] returns here today overall finally starting to feel better.  He has started to gain back some of his weight.  He is back doing a spin classes to 3 times a week.  He now plays golf about 4 days a week is really getting his energy level back.  His biggest concern was that he was still eating  "comfort foods" that he was able to tolerate when he was getting chemotherapy and radiation that he develops he probably should continue to do now.  He had lost about 20 pounds and is gained about 10 it back.  He realizes he needs to back off a little bit.  He is no longer on beta-blocker and indicates that he is doing well able to get his heart rate up at spin class with no major issues.  He says is up to about 75% from where he had been before.  This indicates that his blood pressure is definitely improving.  He is also really no longer having any alcohol.  With him having gone through the lung cancer issue, he started taking his CAD a little more seriously and acknowledges that he probably does need to think about treating his lipids.  Now that he is feeling better he is willing to give a trial run of statin.  He does acknowledge that he is not having any cardiac symptoms.  No further tachycardia spells, no angina or dyspnea.  No arrhythmia or heart failure symptoms.  CV Review of Symptoms (Summary) Cardiovascular ROS: no chest pain or dyspnea on exertion negative for - edema, irregular heartbeat, orthopnea, palpitations, paroxysmal nocturnal dyspnea, rapid heart rate, shortness of breath or Syncope/near syncope, TIA/amaurosis fugax, claudication  The patient does not have symptoms concerning for COVID-19 infection (fever, chills, cough, or new  shortness of breath).  The patient is practicing social distancing & Masking.  --> He is fully vaccinated for Covid  REVIEWED OF SYSTEMS   Review of Systems  Constitutional: Negative for malaise/fatigue and weight loss.  HENT: Negative for congestion and nosebleeds.   Respiratory: Negative for cough and shortness of breath.   Cardiovascular: Negative for leg swelling.  Gastrointestinal: Negative for blood in stool and melena.  Genitourinary: Negative for hematuria.       Starting notice issues with erectile dysfunction  Musculoskeletal: Negative for  falls and joint pain.  Neurological: Negative.   Psychiatric/Behavioral: Negative.    I have reviewed and (if needed) personally updated the patient's problem list, medications, allergies, past medical and surgical history, social and family history.   PAST MEDICAL HISTORY   Past Medical History:  Diagnosis Date  . CAD S/P percutaneous coronary angioplasty 2004   which showed essentially normal LV size and function, moderately dilated left atrium, moderate mitral prolapse with mild to moderate regurgitation.  . Dyslipidemia, goal LDL below 70   . Essential hypertension   . GERD (gastroesophageal reflux disease)   . History of Mitral valve prolapse    Moderate - with moderate MR, noted February t 2013  . History of Severe mitral regurgitation by prior echocardiogram 02/06/2014   TEE: Severe mitral regurgitation with a flail P2 segment and ruptured; Normal LV size & function, dilated LA.  Marland Kitchen History of stress test 12/2009   he walked 9 mins reaching 10 METS. There was an attenuation artifact in the inferior region but no ischemia or infaract, low risk.  . Incidental lung nodule, greater than or equal to 43mm 03/16/2014   Ground glass opacity RML noted on CT scan  . lung ca dx'd 03/2019  . PONV (postoperative nausea and vomiting)   . 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 ST elevation myocardial infarction (STEMI) of anterior wall, subsequent episode of care [facility]) 2004   he had a proximal LAD occlusion treated with 2 overlapping 3.5 x 1.8 mm Cypher DES stents.    PAST SURGICAL HISTORY   Past Surgical History:  Procedure Laterality Date  . ANTERIOR CRUCIATE LIGAMENT REPAIR Left 1993  . CARDIAC CATHETERIZATION  2004     he had a proximal LAD occlusion treated with 2 overlapping 3.5 x 1.8 mm Cypher DES stents.  Marland Kitchen CARDIAC CATHETERIZATION  2007; 03/2014   Widely patent LAD stents, 40% distal LAD, 30% Cx, ~50% PL-PDA bifurcation lesion   . COLONOSCOPY    . INTRAOPERATIVE TRANSESOPHAGEAL ECHOCARDIOGRAM N/A 04/08/2014   Procedure: INTRAOPERATIVE TRANSESOPHAGEAL ECHOCARDIOGRAM;  Surgeon: Rexene Alberts, MD;  Location: [place];  Service: Open Heart Surgery;  Laterality: N/A;  . LEFT AND RIGHT HEART CATHETERIZATION WITH CORONARY ANGIOGRAM N/A 03/05/2014   Procedure: LEFT AND RIGHT HEART CATHETERIZATION WITH CORONARY ANGIOGRAM;  Surgeon: Leonie Man, MD;  Location: [facility] CATH LAB;  Service: Cardiovascular;  Laterality: N/A;  . LEFT HEART CATHETERIZATION WITH CORONARY ANGIOGRAM N/A 09/15/2011   Procedure: LEFT HEART CATHETERIZATION WITH CORONARY ANGIOGRAM;  Surgeon: Lorretta Harp, MD;  Location: [facility] CATH LAB;  Service: Cardiovascular;  Laterality: N/A;  . MITRAL VALVE REPAIR Right 04/08/2014   Procedure: MINIMALLY INVASIVE MITRAL VALVE REPAIR (MVR);  Surgeon: Rexene Alberts, MD;  Location: [facility] OR;  Service: Open Heart Surgery;  Laterality: Right;  . NM MYOVIEW LTD  May 2011   Walk 9 minutes, and 10 METs, diaphragmatic attenuation but no ischemia or infarction.  . SVT ABLATION N/A 10/17/2019   Procedure: SVT ABLATION;  Surgeon: Constance Haw, MD;  Location: [place] CV LAB;  Service: Cardiovascular;  Laterality: N/A;  . TEE WITHOUT CARDIOVERSION N/A 02/06/2014   Procedure: TRANSESOPHAGEAL ECHOCARDIOGRAM (TEE);  Surgeon: Pixie Casino, MD;  Normal LV Size U& function - EF 55-60%, no regional WMA.  MV P2 Leaflet is flail with ruptured chord with severe prolapse, anterior leaflet intact.  Severe, eccentric anterior directed MR with dilated LA.  Marland Kitchen TRANSTHORACIC ECHOCARDIOGRAM  07/18/2018   Normal LV size and function.  EF 50-60 %.  Unable to assess diastolic function.  Mitral valve sewing ring in place.  Mild stenosis  with a gradient of 6 mmHg noted. -  Domingo Dimes ECHOCARDIOGRAM  June 30 2015   Low normal LV function with EF 50-55%. GR 1 DD. No MR. Normal gradient of 4 mmHg the mitral valve. No prolapse.  Marland Kitchen VIDEO BRONCHOSCOPY WITH ENDOBRONCHIAL ULTRASOUND N/A 05/16/2019   Procedure: VIDEO BRONCHOSCOPY WITH ENDOBRONCHIAL ULTRASOUND;  Surgeon: Marshell Garfinkel, MD;  Location: [place];  Service: Pulmonary;  Laterality: N/A;    MEDICATIONS/ALLERGIES   Current Meds  Medication Sig  . Ascorbic Acid (VITAMIN C) 1000 MG tablet Take 2,000 mg by mouth daily.   . Coenzyme Q10 (CO Q 10) 100 MG CAPS Take 100 mg by mouth daily.   Marland Kitchen MAGNESIUM PO Take 1 tablet by mouth at bedtime.  . Melatonin 10 MG TABS Take 10 mg by mouth at bedtime.  Marland Kitchen Specialty Vitamins Products (PROSTATE PO) Take 1 capsule by mouth daily. PROSTATE FORMULA  . Vitamin D-Vitamin K (D3 + K2 DOTS PO) Take 1 tablet by mouth daily.    Allergies  Allergen Reactions  . A-Cillin [Ampicillin] Shortness Of Breath, Swelling and Rash    Did it involve swelling of the face/tongue/throat, SOB, or low BP? Yes Did it involve sudden or severe rash/hives, skin peeling, or any reaction on the inside of your mouth or nose? Yes Did you need to seek medical attention at a hospital or doctor's office? Yes When did it last happen?~20 years ago If all above answers are "NO", may proceed with cephalosporin use.    Marland Kitchen Amoxicillin Hives, Shortness Of Breath and Swelling    Did it involve swelling of the face/tongue/throat, SOB, or low BP? Yes Did it involve sudden or severe rash/hives, skin peeling, or any reaction on the inside of your mouth or nose? Yes Did you need to seek medical attention at a hospital or doctor's office? Yes When did it last happen?~20 years ago If all above answers are "NO", may proceed with cephalosporin use.   . Other Hives, Shortness Of Breath and Swelling    ALL "CILLINS"  . Penicillins Hives, Shortness Of Breath and Swelling     Did it involve swelling of the face/tongue/throat, SOB, or low BP? Yes Did it involve sudden or severe rash/hives, skin peeling, or any reaction on the inside of your mouth or nose? Yes Did you need to seek medical attention at a hospital or doctor's office? Yes When did it last happen?~20 years ago If all above answers are "NO", may proceed with cephalosporin use.    . Sulfa Antibiotics Nausea Only    SOCIAL HISTORY/FAMILY HISTORY   Reviewed in Epic:  Pertinent findings: He is excited to get back into his routine life, completing his cancer treatments.  OBJCTIVE -PE, EKG, labs   Wt Readings from Last 3 Encounters:  03/29/20 184 lb 6.4 oz (83.6 kg)  03/18/20 186 lb (84.4 kg)  02/19/20 184 lb 12.8 oz (83.8 kg)    Physical Exam: BP 100/74   Pulse 75   Ht 6\' 4"  (1.93 m)   Wt 184 lb 6.4 oz (83.6 kg)   BMI 22.45 kg/m  Physical Exam Vitals reviewed.  Constitutional:      General: He is not in acute distress.    Appearance: Normal appearance. He is normal weight. He is not ill-appearing.     Comments: Healthy-appearing.  Well-groomed  Neck:     Vascular: No carotid bruit, hepatojugular reflux or JVD.  Cardiovascular:     Rate and Rhythm: Normal rate and regular rhythm.  No extrasystoles are present.    Chest Wall: PMI is not displaced.     Pulses: Intact distal pulses. No decreased pulses.     Heart sounds: S1 normal and S2 normal. Murmur (Soft systolic murmur at base.) heard.  No friction rub. No gallop.   Pulmonary:     Effort: Pulmonary effort is normal. No respiratory distress.     Breath sounds: Normal breath sounds.  Musculoskeletal:        General: No swelling. Normal range of motion.     Cervical back: Normal range of motion and neck supple.  Skin:    General: Skin is warm and dry.     Coloration: Skin is not pale.  Neurological:     Mental Status: He is alert and oriented to person, place, and time.  Psychiatric:        Mood and Affect: Mood normal.         Behavior: Behavior normal.        Thought Content: Thought content normal.        Judgment: Judgment normal.     Comments: He is now back to his baseline level of bright cheerful mood.     Adult ECG Report He had that rate: 75 ;  Rhythm: normal sinus rhythm and Cannot exclude right atrial enlargement.  Otherwise normal axis, intervals durations..;   Narrative Interpretation: Stable EKG.  Recent Labs:    Lab Results  Component Value Date   CHOL 221 (H) 02/25/2020   HDL 49 02/25/2020   LDLCALC 154 (H) 02/25/2020   TRIG 98 02/25/2020   CHOLHDL 4.5 02/25/2020   Lab Results  Component Value Date   CREATININE 0.77 03/18/2020   BUN 17 03/18/2020   NA 141 03/18/2020   K 4.2 03/18/2020   CL 109 03/18/2020   CO2 21 (L) 03/18/2020   Lab Results  Component Value Date   TSH 2.572 03/18/2020    ASSESSMENT/PLAN    Problem List Items Addressed This Visit    CAD S/P percutaneous coronary angioplasty - DES PCI to occluded LAD during anterior STEMI - Primary (Chronic)    Doing very well now recovering from his cancer treatment.  He is more active now no active anginal symptoms.  He does not have a lot of blood pressure room to work with.  She is not on any medications besides co-Q10.  For now with no blood pressure to work with, no beta-blocker or ACE inhibitor/ARB. He is ready to start a statin so we will initiate statin and check follow-up labs. He should be on aspirin 81 mg daily ->  he is willing to try going back on it now      Relevant Medications   rosuvastatin (CRESTOR) 10 MG tablet   Other Relevant Orders   EKG 12-Lead (Completed)   Comprehensive Metabolic Panel (CMET)   Lipid panel   History of Severe Mitral valve prolapse - severe prolapse of the posterior (P2) leaflet with severe MR (Chronic)    His last echo was in December 2019.  Valve looked like it was functioning well.  Murmur sounds minimal.  Should be due for follow-up echo next year.      Relevant Medications    rosuvastatin (CRESTOR) 10 MG tablet   Dyslipidemia, goal LDL below 70 (Chronic)    Most recent LDL was 154.  He is in need probably moderate more than just low-dose statin, but is willing to least try.  In the past he had not been willing to consider any statin, now that he is completed his cancer therapy he is willing to proceed with giving a statin chance.   We will initiate 10 mg Crestor building up from 5mg  up to 10 mg at least 5 days a week.  We will then reassess lipids in about 3 months to reassess progress.  Ultimately I think he probably would be a good candidate for Repatha or Praluent      Relevant Medications   rosuvastatin (CRESTOR) 10 MG tablet   Other Relevant Orders   EKG 12-Lead (Completed)   Comprehensive Metabolic Panel (CMET)   Lipid panel   SVT (supraventricular tachycardia) (HCC) (Chronic)    Very happy to have had his ablation procedure done.  No further rapid heart rate palpitations.  Now he does have intermittent skipped beats but nothing prolonged.  No longer on beta-blocker.      Relevant Medications   rosuvastatin (CRESTOR) 10 MG tablet   Other Relevant Orders   EKG 12-Lead (Completed)   Comprehensive Metabolic Panel (CMET)   Lipid panel       COVID-19 Education: The signs and symptoms of COVID-19 were discussed with the patient and how to seek care for testing (follow up with PCP or arrange E-visit).   The importance of social distancing and COVID-19 vaccination was discussed today.  I spent a total of 68minutes with the patient. >  50% of the time was spent in direct patient consultation.  Additional time spent with chart review  / charting (studies, outside notes, etc): 10 Total Time: 34 min   Current medicines are reviewed at length with the patient today.  (+/- concerns) says that he is ready to consider statin.  Notice: This dictation was prepared with Dragon dictation along with smaller phrase technology. Any transcriptional errors that  result from this process are unintentional and may not be corrected upon review.  Patient Instructions / Medication Changes & Studies & Tests Ordered   Patient Instructions  Medication Instructions:  Start Crestor 10 mg daily Start slow just take 1 time the first week 2nd week take 2 then the 3rd week take 3 4th week take 4 then take 10 mg daily for 5 days hold on Sat and Sun Continue all other medications If you need a refill on your cardiac medications before your next appointment, please call your pharmacy*   Lab Work: Lipid panel and Cmet in Feb Lab order enclosed    Testing/Procedures: None ordered   Follow-Up: At [facility], you and your health needs are our priority.  As part of our continuing mission to provide  you with exceptional heart care, we have created designated Provider Care Teams.  These Care Teams include your primary Cardiologist (physician) and Advanced Practice Providers (APPs -  Physician Assistants and Nurse Practitioners) who all work together to provide you with the care you need, when you need it.  We recommend signing up for the patient portal called "MyChart".  Sign up information is provided on this After Visit Summary.  MyChart is used to connect with patients for Virtual Visits (Telemedicine).  Patients are able to view lab/test results, encounter notes, upcoming appointments, etc.  Non-urgent messages can be sent to your provider as well.   To learn more about what you can do with MyChart, go to [email or web address].    Your next appointment:  Call in Nov to schedule Feb appointment    The format for your next appointment: Office   Provider:  Dr.Xxavier Noon      Studies Ordered:   Orders Placed This Encounter  Procedures  . Comprehensive Metabolic Panel (CMET)  . Lipid panel  . EKG 12-Lead     Glenetta Hew, M.D., M.S. Interventional Cardiologist   Pager # [phone] Phone # [phone] [address]. [place], [place] [address]   Thank you for choosing [facility] at [facility]!!

## 2020-03-29 NOTE — Patient Instructions (Signed)
Medication Instructions:  Start Crestor 10 mg daily Start slow just take 1 time the first week 2nd week take 2 then the 3rd week take 3 4th week take 4 then take 10 mg daily for 5 days hold on Sat and Sun Continue all other medications If you need a refill on your cardiac medications before your next appointment, please call your pharmacy*   Lab Work: Lipid panel and Cmet in Feb Lab order enclosed    Testing/Procedures: None ordered   Follow-Up: At [facility], you and your health needs are our priority.  As part of our continuing mission to provide you with exceptional heart care, we have created designated Provider Care Teams.  These Care Teams include your primary Cardiologist (physician) and Advanced Practice Providers (APPs -  Physician Assistants and Nurse Practitioners) who all work together to provide you with the care you need, when you need it.  We recommend signing up for the patient portal called "MyChart".  Sign up information is provided on this After Visit Summary.  MyChart is used to connect with patients for Virtual Visits (Telemedicine).  Patients are able to view lab/test results, encounter notes, upcoming appointments, etc.  Non-urgent messages can be sent to your provider as well.   To learn more about what you can do with MyChart, go to [email or web address].    Your next appointment:  Call in Nov to schedule Feb appointment    The format for your next appointment: Office   Provider:  Dr.Harding

## 2020-04-05 ENCOUNTER — Encounter: Payer: Self-pay | Admitting: Cardiology

## 2020-04-05 NOTE — Assessment & Plan Note (Signed)
Very happy to have had his ablation procedure done.  No further rapid heart rate palpitations.  Now he does have intermittent skipped beats but nothing prolonged.  No longer on beta-blocker.

## 2020-04-05 NOTE — Assessment & Plan Note (Signed)
His last echo was in December 2019.  Valve looked like it was functioning well.  Murmur sounds minimal.  Should be due for follow-up echo next year.

## 2020-04-05 NOTE — Assessment & Plan Note (Signed)
Doing very well now recovering from his cancer treatment.  He is more active now no active anginal symptoms.  He does not have a lot of blood pressure room to work with.  She is not on any medications besides co-Q10.  For now with no blood pressure to work with, no beta-blocker or ACE inhibitor/ARB. He is ready to start a statin so we will initiate statin and check follow-up labs. He should be on aspirin 81 mg daily -> he is willing to try going back on it now

## 2020-04-05 NOTE — Assessment & Plan Note (Signed)
Most recent LDL was 154.  He is in need probably moderate more than just low-dose statin, but is willing to least try.  In the past he had not been willing to consider any statin, now that he is completed his cancer therapy he is willing to proceed with giving a statin chance.   We will initiate 10 mg Crestor building up from 5mg  up to 10 mg at least 5 days a week.  We will then reassess lipids in about 3 months to reassess progress.  Ultimately I think he probably would be a good candidate for Repatha or Praluent

## 2020-04-14 ENCOUNTER — Inpatient Hospital Stay: Payer: Federal, State, Local not specified - PPO

## 2020-04-14 ENCOUNTER — Other Ambulatory Visit: Payer: Self-pay

## 2020-04-14 ENCOUNTER — Encounter: Payer: Self-pay | Admitting: Internal Medicine

## 2020-04-14 ENCOUNTER — Inpatient Hospital Stay: Payer: Federal, State, Local not specified - PPO | Attending: Internal Medicine | Admitting: Internal Medicine

## 2020-04-14 VITALS — BP 109/75 | HR 82 | Temp 98.2°F | Resp 18 | Ht 76.0 in | Wt 186.7 lb

## 2020-04-14 DIAGNOSIS — I10 Essential (primary) hypertension: Secondary | ICD-10-CM

## 2020-04-14 DIAGNOSIS — C3490 Malignant neoplasm of unspecified part of unspecified bronchus or lung: Secondary | ICD-10-CM

## 2020-04-14 DIAGNOSIS — C3431 Malignant neoplasm of lower lobe, right bronchus or lung: Secondary | ICD-10-CM | POA: Diagnosis present

## 2020-04-14 DIAGNOSIS — Z79899 Other long term (current) drug therapy: Secondary | ICD-10-CM | POA: Insufficient documentation

## 2020-04-14 DIAGNOSIS — C3491 Malignant neoplasm of unspecified part of right bronchus or lung: Secondary | ICD-10-CM

## 2020-04-14 DIAGNOSIS — Z5112 Encounter for antineoplastic immunotherapy: Secondary | ICD-10-CM | POA: Insufficient documentation

## 2020-04-14 LAB — CBC WITH DIFFERENTIAL (CANCER CENTER ONLY)
Abs Immature Granulocytes: 0.02 10*3/uL (ref 0.00–0.07)
Basophils Absolute: 0 10*3/uL (ref 0.0–0.1)
Basophils Relative: 1 %
Eosinophils Absolute: 0.1 10*3/uL (ref 0.0–0.5)
Eosinophils Relative: 4 %
HCT: 41.5 % (ref 39.0–52.0)
Hemoglobin: 14.2 g/dL (ref 13.0–17.0)
Immature Granulocytes: 1 %
Lymphocytes Relative: 15 %
Lymphs Abs: 0.6 10*3/uL — ABNORMAL LOW (ref 0.7–4.0)
MCH: 32.1 pg (ref 26.0–34.0)
MCHC: 34.2 g/dL (ref 30.0–36.0)
MCV: 93.9 fL (ref 80.0–100.0)
Monocytes Absolute: 0.5 10*3/uL (ref 0.1–1.0)
Monocytes Relative: 11 %
Neutro Abs: 2.8 10*3/uL (ref 1.7–7.7)
Neutrophils Relative %: 68 %
Platelet Count: 169 10*3/uL (ref 150–400)
RBC: 4.42 MIL/uL (ref 4.22–5.81)
RDW: 12.2 % (ref 11.5–15.5)
WBC Count: 4.1 10*3/uL (ref 4.0–10.5)
nRBC: 0 % (ref 0.0–0.2)

## 2020-04-14 LAB — CMP (CANCER CENTER ONLY)
ALT: 13 U/L (ref 0–44)
AST: 19 U/L (ref 15–41)
Albumin: 3.9 g/dL (ref 3.5–5.0)
Alkaline Phosphatase: 85 U/L (ref 38–126)
Anion gap: 7 (ref 5–15)
BUN: 17 mg/dL (ref 8–23)
CO2: 23 mmol/L (ref 22–32)
Calcium: 10.1 mg/dL (ref 8.9–10.3)
Chloride: 110 mmol/L (ref 98–111)
Creatinine: 0.79 mg/dL (ref 0.61–1.24)
GFR, Est AFR Am: >60 mL/min (ref >60)
GFR, Estimated: >60 mL/min (ref >60)
Glucose, Bld: 98 mg/dL (ref 70–99)
Potassium: 4.1 mmol/L (ref 3.5–5.1)
Sodium: 140 mmol/L (ref 135–145)
Total Bilirubin: 0.9 mg/dL (ref 0.3–1.2)
Total Protein: 6.5 g/dL (ref 6.5–8.1)

## 2020-04-14 LAB — TSH: TSH: 2.44 u[IU]/mL (ref 0.320–4.118)

## 2020-04-14 MED ORDER — SODIUM CHLORIDE 0.9 % IV SOLN
1500.0000 mg | Freq: Once | INTRAVENOUS | Status: AC
  Administered 2020-04-14: 1500 mg via INTRAVENOUS
  Filled 2020-04-14: qty 30

## 2020-04-14 MED ORDER — SODIUM CHLORIDE 0.9 % IV SOLN
Freq: Once | INTRAVENOUS | Status: AC
  Filled 2020-04-14: qty 250

## 2020-04-14 NOTE — Progress Notes (Signed)
[place] [phone]   [phone]  OFFICE PROGRESS NOTE  [name], L.[name], [place] [address] [place]  [address]  DIAGNOSIS: stage IIIb (T2b, N3, M0) non-small cell lung cancer, adenocarcinoma presented with right lower lobe lung nodule in addition to right hilar mass/node as well as ipsilateral and contralateral mediastinal lymphadenopathy diagnosed in October 2020.  Molecular studies by [employer] showed no actionable mutations.  PRIOR THERAPY: Concurrent chemoradiation with weekly carboplatin for AUC of 2 and paclitaxel 45 mg/M2.  Status post 7 cycles.  Last dose was given July 21, 2019  CURRENT THERAPY: Consolidation treatment with immunotherapy with Imfinzi 1500 mg.  Status post 8 cycles.  INTERVAL HISTORY: Alan [name] 64 y.o. male returns to the clinic today for follow-up visit.  The patient is feeling fine today with no concerning complaints.  He continues to tolerate his treatment with consolidation Imfinzi fairly well.  He denied having any current chest pain, shortness of breath, cough or hemoptysis.  He denied having any fever or chills.  He has no nausea, vomiting, diarrhea or constipation.  He has no headache or visual changes.  He is here today for evaluation before starting cycle #9 of his treatment.   MEDICAL HISTORY: Past Medical History:  Diagnosis Date  . CAD S/P percutaneous coronary angioplasty 2004   which showed essentially normal LV size and function, moderately dilated left atrium, moderate mitral prolapse with mild to moderate regurgitation.  . Dyslipidemia, goal LDL below 70   . Essential hypertension   . GERD (gastroesophageal reflux disease)   . History of Mitral valve prolapse    Moderate - with moderate MR, noted February t 2013  . History of Severe mitral regurgitation by prior echocardiogram 02/06/2014   TEE: Severe mitral regurgitation with a flail P2 segment and ruptured; Normal LV  size & function, dilated LA.  Marland Kitchen History of stress test 12/2009   he walked 9 mins reaching 10 METS. There was an attenuation artifact in the inferior region but no ischemia or infaract, low risk.  . Incidental lung nodule, greater than or equal to 23mm 03/16/2014   Ground glass opacity RML noted on CT scan  . lung ca dx'd 03/2019  . PONV (postoperative nausea and vomiting)   . 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 ST elevation myocardial infarction (STEMI) of anterior wall, subsequent episode of care [facility]) 2004   he had a proximal LAD occlusion treated with 2 overlapping 3.5 x 1.8 mm Cypher DES stents.    ALLERGIES:  is allergic to a-cillin [ampicillin], amoxicillin, other, penicillins, and sulfa antibiotics.  MEDICATIONS:  Current Outpatient Medications  Medication Sig Dispense Refill  . Ascorbic Acid (VITAMIN C) 1000 MG tablet Take 2,000 mg by mouth daily.     . Coenzyme Q10 (CO Q 10) 100 MG CAPS Take 100 mg by mouth daily.     Marland Kitchen MAGNESIUM PO Take 1 tablet by mouth at bedtime.    . Melatonin 10 MG TABS Take 10 mg by mouth at bedtime.    . rosuvastatin (CRESTOR) 10 MG tablet Take 1 tablet (10 mg total) by mouth daily. 90 tablet 3  . Specialty Vitamins Products (PROSTATE PO) Take 1 capsule by mouth  daily. PROSTATE FORMULA    . Vitamin D-Vitamin K (D3 + K2 DOTS PO) Take 1 tablet by mouth daily.     No current facility-administered medications for this visit.    SURGICAL HISTORY:  Past Surgical History:  Procedure Laterality Date  . ANTERIOR CRUCIATE LIGAMENT REPAIR Left 1993  . CARDIAC CATHETERIZATION  2004   he had a proximal LAD occlusion treated with 2 overlapping 3.5 x 1.8 mm Cypher DES stents.  Marland Kitchen CARDIAC  CATHETERIZATION  2007; 03/2014   Widely patent LAD stents, 40% distal LAD, 30% Cx, ~50% PL-PDA bifurcation lesion   . COLONOSCOPY    . INTRAOPERATIVE TRANSESOPHAGEAL ECHOCARDIOGRAM N/A 04/08/2014   Procedure: INTRAOPERATIVE TRANSESOPHAGEAL ECHOCARDIOGRAM;  Surgeon: Rexene Alberts, MD;  Location: [facility];  Service: Open Heart Surgery;  Laterality: N/A;  . LEFT AND RIGHT HEART CATHETERIZATION WITH CORONARY ANGIOGRAM N/A 03/05/2014   Procedure: LEFT AND RIGHT HEART CATHETERIZATION WITH CORONARY ANGIOGRAM;  Surgeon: Leonie Man, MD;  Location[facility] CATH LAB;  Service: Cardiovascular;  Laterality: N/A;  . LEFT HEART CATHETERIZATION WITH CORONARY ANGIOGRAM N/A 09/15/2011   Procedure: LEFT HEART CATHETERIZATION WITH CORONARY ANGIOGRAM;  Surgeon: Lorretta Harp, MD;  Location: [facility] CATH LAB;  Service: Cardiovascular;  Laterality: N/A;  . MITRAL VALVE REPAIR Right 04/08/2014   Procedure: MINIMALLY INVASIVE MITRAL VALVE REPAIR (MVR);  Surgeon: Rexene Alberts, MD;  Location: [facility];  Service: Open Heart Surgery;  Laterality: Right;  . NM MYOVIEW LTD  May 2011   Walk 9 minutes, and 10 METs, diaphragmatic attenuation but no ischemia or infarction.  . SVT ABLATION N/A 10/17/2019   Procedure: SVT ABLATION;  Surgeon: Constance Haw, MD;  Location: [facility] CV LAB;  Service: Cardiovascular;  Laterality: N/A;  . TEE WITHOUT CARDIOVERSION N/A 02/06/2014   Procedure: TRANSESOPHAGEAL ECHOCARDIOGRAM (TEE);  Surgeon: Pixie Casino, MD;  Normal LV Size U& function - EF 55-60%, no regional WMA.  MV P2 Leaflet is flail with ruptured chord with severe prolapse, anterior leaflet intact.  Severe, eccentric anterior directed MR with dilated LA.  Marland Kitchen TRANSTHORACIC ECHOCARDIOGRAM  07/18/2018   Normal LV size and function.  EF 50-60 %.  Unable to assess diastolic function.  Mitral valve sewing ring in place.  Mild stenosis with a gradient of 6 mmHg noted. -  Domingo Dimes ECHOCARDIOGRAM  June 30 2015   Low normal LV  function with EF 50-55%. GR 1 DD. No MR. Normal gradient of 4 mmHg the mitral valve. No prolapse.  Marland Kitchen VIDEO BRONCHOSCOPY WITH ENDOBRONCHIAL ULTRASOUND N/A 05/16/2019   Procedure: VIDEO BRONCHOSCOPY WITH ENDOBRONCHIAL ULTRASOUND;  Surgeon: Marshell Garfinkel, MD;  Location: [facility];  Service: Pulmonary;  Laterality: N/A;    REVIEW OF SYSTEMS:  A comprehensive review of systems was negative except for: Respiratory: positive for cough   PHYSICAL EXAMINATION: General appearance: alert, cooperative and no distress Head: Normocephalic, without obvious abnormality, atraumatic Neck: no adenopathy, no JVD, supple, symmetrical, trachea midline and thyroid not enlarged, symmetric, no tenderness/mass/nodules Lymph nodes: Cervical, supraclavicular, and axillary nodes normal. Resp: clear to auscultation bilaterally Back: symmetric, no curvature. ROM normal. No CVA tenderness. Cardio: regular rate and rhythm, S1, S2 normal, no murmur, click, rub or gallop GI: soft, non-tender; bowel sounds normal; no masses,  no organomegaly Extremities: extremities normal, atraumatic, no cyanosis or edema  ECOG PERFORMANCE STATUS: 1 - Symptomatic but completely ambulatory  Blood pressure 109/75, pulse 82, temperature 98.2 F (36.8 C), temperature source Tympanic, resp. rate 18, height 6\' 4"  (1.93  m), weight 186 lb 11.2 oz (84.7 kg), SpO2 100 %.  LABORATORY DATA: Lab Results  Component Value Date   WBC 4.1 04/14/2020   HGB 14.2 04/14/2020   HCT 41.5 04/14/2020   MCV 93.9 04/14/2020   PLT 169 04/14/2020      Chemistry      Component Value Date/Time   NA 141 03/18/2020 0833   NA 141 02/25/2020 0915   K 4.2 03/18/2020 0833   CL 109 03/18/2020 0833   CO2 21 (L) 03/18/2020 0833   BUN 17 03/18/2020 0833   BUN 14 02/25/2020 0915   CREATININE 0.77 03/18/2020 0833   CREATININE 0.80 02/27/2014 1152      Component Value Date/Time   CALCIUM 10.1 03/18/2020 0833   ALKPHOS 77 03/18/2020 0833   AST 16 03/18/2020 0833    ALT 12 03/18/2020 0833   BILITOT 1.1 03/18/2020 0833       RADIOGRAPHIC STUDIES: No results found.  ASSESSMENT AND PLAN: This is a very pleasant 64 years old white male recently diagnosed with a stage IIIb non-small cell lung cancer, adenocarcinoma with no actionable mutation.  He completed a course of concurrent chemoradiation with weekly carboplatin and paclitaxel status post 7 cycles.   His treatment was complicated with dysphagia, odynophagia secondary to radiation induced esophagitis.  He also has persistent cough and shortness of breath secondary to radiation-induced pneumonitis. He has partial response after this treatment. The patient is currently undergoing treatment with consolidation immunotherapy with Imfinzi 1500 mg IV every 4 weeks is status post 8 cycles. The patient continues to tolerate his treatment fairly well with no concerning adverse effects. I recommended for him to proceed with cycle #9 today as planned. I will see him back for follow-up visit in 4 weeks for evaluation with repeat CT scan of the chest for restaging of his disease. The patient was advised to call immediately if he has any concerning symptoms in the interval. The patient voices understanding of current disease status and treatment options and is in agreement with the current care plan. All questions were answered. The patient knows to call the clinic with any problems, questions or concerns. We can certainly see the patient much sooner if necessary.  Disclaimer: This note was dictated with voice recognition software. Similar sounding words can inadvertently be transcribed and may not be corrected upon review.

## 2020-04-14 NOTE — Patient Instructions (Signed)
[facility] Discharge Instructions for Patients Receiving Chemotherapy  Today you received the following chemotherapy agents: Imfinzi  To help prevent nausea and vomiting after your treatment, we encourage you to take your nausea medication as prescribed.    If you develop nausea and vomiting that is not controlled by your nausea medication, call the clinic.   BELOW ARE SYMPTOMS THAT SHOULD BE REPORTED IMMEDIATELY:  *FEVER GREATER THAN 100.5 F  *CHILLS WITH OR WITHOUT FEVER  NAUSEA AND VOMITING THAT IS NOT CONTROLLED WITH YOUR NAUSEA MEDICATION  *UNUSUAL SHORTNESS OF BREATH  *UNUSUAL BRUISING OR BLEEDING  TENDERNESS IN MOUTH AND THROAT WITH OR WITHOUT PRESENCE OF ULCERS  *URINARY PROBLEMS  *BOWEL PROBLEMS  UNUSUAL RASH Items with * indicate a potential emergency and should be followed up as soon as possible.  Feel free to call the clinic should you have any questions or concerns. The clinic phone number is [phone].  Please show the [facility] at check-in to the Emergency Department and triage nurse.

## 2020-04-20 ENCOUNTER — Telehealth: Payer: Self-pay | Admitting: Cardiology

## 2020-04-20 NOTE — Telephone Encounter (Signed)
Pt c/o medication issue:  1. Name of Medication: rosuvastatin (CRESTOR) 10 MG tablet  2. How are you currently taking this medication (dosage and times per day)? 2-3 a week  3. Are you having a reaction (difficulty breathing--STAT)?   4. What is your medication issue? Having stomach problem, and HR issues, sitting HR is 95+ BP 110/70

## 2020-04-20 NOTE — Telephone Encounter (Signed)
Spoke with patient.  He isn't completely convinced that the rosuvastatin is cause of his symptoms, but is the only thing that has changed recently.   States he tried 2 week course of omeprazole with no change.  Symptoms occur mostly after meals.  Will have him hold rosuvastatin x 2 weeks.  He should also work on some dietary issues including smaller meals and monitoring fat intake.  He will re-challenge after that 2 week period with 5 mg daily instead of 10 mg.  He can increase back to 10 if tolerating.

## 2020-04-20 NOTE — Telephone Encounter (Signed)
Patient called into office regarding his crestor prescription. Patient stated he is a cancer patient currently undergoing immune therapy and stated he just recently started Crestor 10mg , he stated he started slowly and is taking the Crestor 3 times a week after his appointment with Dr. Ellyn Hack. Patient states that since starting the Crestor he has had several episodes of abdominal bloating and sometimes some pressure in his chest that he states feels like someone is sitting on his chest. Patient states that the feeling of being full, chest pressure and the bloating in his stomach does feel better after burping. Patient states that at times he is slightly dizzy. Patient states his blood pressure today is 110/70 with a resting heart rate 95.Patient does state that his stomach feels like it did when he was having his SVT episodes but HR resting today is 95.  Patient advised that if he were to continue to have chest pressure and dizziness he would need to be seen at the ER. Patient states he has had a heart attack in the past and denies any of those symptoms and states that he knows what those symptoms feel like and says that this is not it. Patient would like to know if he should stop taking his Crestor for a few days and see if his symptoms subside. Offered to make patient an apt with an APP to be seen but he denies, he would like to hear what Dr. Ellyn Hack or Pharmacist says about the Crestor.

## 2020-05-10 ENCOUNTER — Ambulatory Visit (HOSPITAL_COMMUNITY)
Admission: RE | Admit: 2020-05-10 | Discharge: 2020-05-10 | Disposition: A | Discharge status: Home / Self Care | Payer: Federal, State, Local not specified - PPO | Source: Ambulatory Visit | Attending: Internal Medicine | Admitting: Internal Medicine

## 2020-05-10 ENCOUNTER — Encounter (HOSPITAL_COMMUNITY): Payer: Self-pay

## 2020-05-10 ENCOUNTER — Other Ambulatory Visit: Payer: Self-pay

## 2020-05-10 DIAGNOSIS — C3490 Malignant neoplasm of unspecified part of unspecified bronchus or lung: Secondary | ICD-10-CM | POA: Insufficient documentation

## 2020-05-10 MED ORDER — IOHEXOL 300 MG/ML  SOLN
75.0000 mL | Freq: Once | INTRAMUSCULAR | Status: AC | PRN
  Administered 2020-05-10: 75 mL via INTRAVENOUS

## 2020-05-10 NOTE — Progress Notes (Signed)
[facility] OFFICE PROGRESS NOTE  [name], L.[name], [place] [address] [place] [address]  DIAGNOSIS: stage IIIb (T2b, N3, M0) non-small cell lung cancer, adenocarcinoma presented with right lower lobe lung nodule in addition to right hilar mass/node as well as ipsilateral and contralateral mediastinal lymphadenopathy diagnosed in October 2020.  Molecular studies by [employer] 360showed no actionable mutations.  PRIOR THERAPY: Concurrent chemoradiation with weekly carboplatin for AUC of 2 and paclitaxel 45 mg/M2. Status post 7 cycles. Last dose was given July 21, 2019  CURRENT THERAPY: Consolidation treatment with immunotherapy with Imfinzi 1500 mg.Status post9cycles.  INTERVAL HISTORY: Alan [name] 64 y.o. male returns to clinic today for follow-up visit.  The patient is feeling fairly well today without any concerning complaints.  The patient is currently working with his cardiologist regarding his intolerance to his Crestor.  He continues to sometimes endorse pressure in the lower part of his chest/upper abdomen with associated increased heart rate that resolves with belching.  The patient has tried PPIs in the past without any resolution in his symptoms.  Otherwise he has been active recently he weight lifts a few times a week as well as attends spin classes.  On the weekends, he sometimes golfs.  The patient denies any recent fever, chills, night sweats, or weight loss.  He denies any chest pain or hemoptysis.  Patient states that he had a coughing fit a few weeks ago secondary to postnasal drainage that resolved with Benadryl.  He denies any nausea, vomiting, diarrhea, or constipation.  He denies any headache or visual changes.  The patient recently had a restaging CT scan performed.  He is here today for evaluation and to review his scan results before starting cycle #10.  MEDICAL HISTORY: Past Medical History:  Diagnosis Date   CAD S/P  percutaneous coronary angioplasty 2004   which showed essentially normal LV size and function, moderately dilated left atrium, moderate mitral prolapse with mild to moderate regurgitation.   Dyslipidemia, goal LDL below 70    Essential hypertension    GERD (gastroesophageal reflux disease)    History of Mitral valve prolapse    Moderate - with moderate MR, noted February t 2013   History of Severe mitral regurgitation by prior echocardiogram 02/06/2014   TEE: Severe mitral regurgitation with a flail P2 segment and ruptured; Normal LV size & function, dilated LA.   History of stress test 12/2009   he walked 9 mins reaching 10 METS. There was an attenuation artifact in the inferior region but no ischemia or infaract, low risk.   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    ALLERGIES:  is allergic to a-cillin [ampicillin], amoxicillin, other, penicillins, and sulfa antibiotics.  MEDICATIONS:  Current Outpatient Medications  Medication Sig Dispense Refill   Ascorbic Acid (VITAMIN C) 1000 MG tablet Take 2,000 mg  by mouth daily.      Coenzyme Q10 (CO Q 10) 100 MG CAPS Take 100 mg by mouth daily.      MAGNESIUM PO Take 1 tablet by mouth at bedtime.     Melatonin 10 MG TABS Take 10 mg by mouth at bedtime.     rosuvastatin (CRESTOR) 10 MG tablet Take 1  tablet (10 mg total) by mouth daily. 90 tablet 3   Specialty Vitamins Products (PROSTATE PO) Take 1 capsule by mouth daily. PROSTATE FORMULA     Vitamin D-Vitamin K (D3 + K2 DOTS PO) Take 1 tablet by mouth daily.     No current facility-administered medications for this visit.    SURGICAL HISTORY:  Past Surgical History:  Procedure Laterality Date   ANTERIOR CRUCIATE LIGAMENT REPAIR Left 1993   CARDIAC CATHETERIZATION  2004   he had a proximal LAD occlusion treated with 2 overlapping 3.5 x 1.8 mm Cypher DES stents.   CARDIAC CATHETERIZATION  2007; 03/2014   Widely patent LAD stents, 40% distal LAD, 30% Cx, ~50% PL-PDA bifurcation lesion    COLONOSCOPY     INTRAOPERATIVE TRANSESOPHAGEAL ECHOCARDIOGRAM N/A 04/08/2014   Procedure: INTRAOPERATIVE TRANSESOPHAGEAL ECHOCARDIOGRAM;  Surgeon: Rexene Alberts, MD;  Location: [facility];  Service: Open Heart Surgery;  Laterality: N/A;   LEFT AND RIGHT HEART CATHETERIZATION WITH CORONARY ANGIOGRAM N/A 03/05/2014   Procedure: LEFT AND RIGHT HEART CATHETERIZATION WITH CORONARY ANGIOGRAM;  Surgeon: Leonie Man, MD;  Location: [facility] CATH LAB;  Service: Cardiovascular;  Laterality: N/A;   LEFT HEART CATHETERIZATION WITH CORONARY ANGIOGRAM N/A 09/15/2011   Procedure: LEFT HEART CATHETERIZATION WITH CORONARY ANGIOGRAM;  Surgeon: Lorretta Harp, MD;  Location: [facility] CATH LAB;  Service: Cardiovascular;  Laterality: N/A;   MITRAL VALVE REPAIR Right 04/08/2014   Procedure: MINIMALLY INVASIVE MITRAL VALVE REPAIR (MVR);  Surgeon: Rexene Alberts, MD;  Location: [facility];  Service: Open Heart Surgery;  Laterality: Right;   NM MYOVIEW LTD  May 2011   Walk 9 minutes, and 10 METs, diaphragmatic attenuation but no ischemia or infarction.   SVT ABLATION N/A 10/17/2019   Procedure: SVT ABLATION;  Surgeon: Constance Haw, MD;  Location: [facility] CV LAB;  Service: Cardiovascular;  Laterality: N/A;   TEE WITHOUT CARDIOVERSION N/A 02/06/2014   Procedure:  TRANSESOPHAGEAL ECHOCARDIOGRAM (TEE);  Surgeon: Pixie Casino, MD;  Normal LV Size U& function - EF 55-60%, no regional WMA.  MV P2 Leaflet is flail with ruptured chord with severe prolapse, anterior leaflet intact.  Severe, eccentric anterior directed MR with dilated LA.   TRANSTHORACIC ECHOCARDIOGRAM  07/18/2018   Normal LV size and function.  EF 50-60 %.  Unable to assess diastolic function.  Mitral valve sewing ring in place.  Mild stenosis with a gradient of 6 mmHg noted. -   TRANSTHORACIC ECHOCARDIOGRAM  June 30 2015   Low normal LV function with EF 50-55%. GR 1 DD. No MR. Normal gradient of 4 mmHg the mitral valve. No prolapse.   VIDEO BRONCHOSCOPY WITH ENDOBRONCHIAL ULTRASOUND N/A 05/16/2019   Procedure: VIDEO BRONCHOSCOPY WITH ENDOBRONCHIAL ULTRASOUND;  Surgeon: Marshell Garfinkel, MD;  Location: [facility];  Service: Pulmonary;  Laterality: N/A;    REVIEW OF SYSTEMS:   Review of Systems  Constitutional: Negative for appetite change, chills, fatigue, fever and unexpected weight change.  HENT:   Negative for mouth sores, nosebleeds, sore throat and trouble swallowing.   Eyes: Negative for eye problems and icterus.  Respiratory: Positive for occasional coughing fits in  the morning secondary to nasal drainage.  Negative for cough, hemoptysis, shortness of breath and wheezing.   Cardiovascular: Negative for chest pain and leg swelling.  Gastrointestinal: Negative for abdominal pain, constipation, diarrhea, nausea and vomiting.  Genitourinary: Negative for bladder incontinence, difficulty urinating, dysuria, frequency and hematuria.   Musculoskeletal: Negative for back pain, gait problem, neck pain and neck stiffness.  Skin: Negative for itching and rash.  Neurological: Positive for intermittent vertigo. Negative for dizziness, extremity weakness, gait problem, headaches, light-headedness and seizures.  Hematological: Negative for adenopathy. Does not bruise/bleed easily.   Psychiatric/Behavioral: Negative for confusion, depression and sleep disturbance. The patient is not nervous/anxious.     PHYSICAL EXAMINATION:  Blood pressure 129/89, pulse 81, temperature 97.7 F (36.5 C), temperature source Tympanic, height 6\' 4"  (1.93 m), weight 185 lb 8 oz (84.1 kg), SpO2 98 %.  ECOG PERFORMANCE STATUS: 1 - Symptomatic but completely ambulatory  Physical Exam  Constitutional: Oriented to person, place, and time and well-developed, well-nourished, and in no distress. No distress.  HENT:  Head: Normocephalic and atraumatic.  Mouth/Throat: Oropharynx is clear and moist. No oropharyngeal exudate.  Eyes: Conjunctivae are normal. Right eye exhibits no discharge. Left eye exhibits no discharge. No scleral icterus.  Neck: Normal range of motion. Neck supple.  Cardiovascular: Normal rate, regular rhythm, normal heart sounds and intact distal pulses.   Pulmonary/Chest: Effort normal and breath sounds normal. No respiratory distress. No wheezes. No rales.  Abdominal: Soft. Bowel sounds are normal. Exhibits no distension and no mass. There is no tenderness.  Musculoskeletal: Normal range of motion. Exhibits no edema.  Lymphadenopathy:    No cervical adenopathy.  Neurological: Alert and oriented to person, place, and time. Exhibits normal muscle tone. Gait normal. Coordination normal.  Skin: Skin is warm and dry. No rash noted. Not diaphoretic. No erythema. No pallor.  Psychiatric: Mood, memory and judgment normal.  Vitals reviewed.  LABORATORY DATA: Lab Results  Component Value Date   WBC 4.3 05/12/2020   HGB 14.7 05/12/2020   HCT 42.3 05/12/2020   MCV 92.0 05/12/2020   PLT 186 05/12/2020      Chemistry      Component Value Date/Time   NA 140 04/14/2020 0834   NA 141 02/25/2020 0915   K 4.1 04/14/2020 0834   CL 110 04/14/2020 0834   CO2 23 04/14/2020 0834   BUN 17 04/14/2020 0834   BUN 14 02/25/2020 0915   CREATININE 0.79 04/14/2020 0834   CREATININE 0.80  02/27/2014 1152      Component Value Date/Time   CALCIUM 10.1 04/14/2020 0834   ALKPHOS 85 04/14/2020 0834   AST 19 04/14/2020 0834   ALT 13 04/14/2020 0834   BILITOT 0.9 04/14/2020 0834       RADIOGRAPHIC STUDIES:  CT Chest W Contrast  Result Date: 05/10/2020 CLINICAL DATA:  Lung cancer. Chemotherapy and radiation therapy complete. Immunotherapy in progress. EXAM: CT CHEST WITH CONTRAST TECHNIQUE: Multidetector CT imaging of the chest was performed during intravenous contrast administration. CONTRAST:  43mL OMNIPAQUE IOHEXOL 300 MG/ML  SOLN COMPARISON:  02/18/2020. FINDINGS: Cardiovascular: Atherosclerotic calcification of the aorta and coronary arteries. Ascending aorta measures at the upper limits of normal, 3.9 cm. Heart size normal. No pericardial effusion. Mediastinum/Nodes: No pathologically enlarged mediastinal, left hilar or axillary lymph nodes. Right hilum is difficult to assess due to post treatment distortion. Esophagus is grossly unremarkable. Lungs/Pleura: Parenchymal retraction, architectural distortion and bronchiectasis are seen in the right perihilar region, unchanged. Subpleural scarring along the inferior  right major fissure and posterior right lower lobe. Relatively amorphous ground-glass in the posterior left upper lobe (5/63) is nonspecific. Calcified granulomas. Trace loculated right pleural fluid, similar. Airway is unremarkable. Upper Abdomen: Visualized portions of the liver, gallbladder, adrenal glands, kidneys, spleen, pancreas, stomach and bowel are grossly unremarkable. No upper abdominal adenopathy. Musculoskeletal: Mildly sclerotic lesion in the T3 vertebral body is chronically stable and considered benign. Small rounded sclerotic lesions in the T11 and T12 vertebral bodies are unchanged from 04/30/2019 but are new from 06/07/2015. IMPRESSION: 1. Post treatment scarring in the right hemithorax. No evidence of recurrent or metastatic disease. 2. Small area of  amorphous ground-glass in the posterior left upper lobe is nonspecific. Continued attention on follow-up exams is warranted. 3. Aortic atherosclerosis (ICD10-I70.0). Coronary artery calcification. Electronically Signed   By: Lorin Picket M.D.   On: 05/10/2020 09:54     ASSESSMENT/PLAN:  This is a very pleasant 64 year old Caucasian male diagnosed with stage IIIb non-small cell lung cancer, adenocarcinoma. He presented with a rightlower lobe lung mass and right hilar lymphadenopathy and ipsilateral and contralateral mediastinal lymphadenopathy. He has no actionable mutations. He was diagnosed in October 2020.  He is status post 7 cycles of concurrent chemoradiation with carboplatin for an AUC of 2 and paclitaxel 45 mg/m2. He experienced odynophagia, dysphagia, and radiation induced esophagitis. He was placed on a prednisone taperand is almost finished with his course.He is feeling better at this time.  The patient is currently undergoing treatment with Imfinzi 1500 mg IV every 4 weeks.Status post 9 cycles.  The patient recently had a restaging CT scan performed. Dr. Julien Nordmann personally and independently reviewed the scan and discussed the results with the patient. The scan did not show any evidence of disease progression. Recommend that he proceed with cycle #10 today as scheduled.   We will see him back for a follow up visit in 4 weeks for evaluation before starting cycle #11.   Dr. Julien Nordmann strongly encouraged him to get his COVID-19 vaccine.   The patient was advised to call immediately if he has any concerning symptoms in the interval. The patient voices understanding of current disease status and treatment options and is in agreement with the current care plan. All questions were answered. The patient knows to call the clinic with any problems, questions or concerns. We can certainly see the patient much sooner if necessary  No orders of the defined types were placed in this  encounter.    Edel Rivero L Stanislaus Kaltenbach, PA-C 05/12/20   ADDENDUM: Hematology/Oncology Attending: I had a face-to-face encounter with the patient today.  I recommended his care plan.  This is a very pleasant 64 years old white male diagnosed with a stage IIIb non-small cell lung cancer, adenocarcinoma in October 2020 status post 7 cycles of concurrent chemoradiation with weekly carboplatin and paclitaxel with radiation-induced esophagitis, odynophagia and dysphagia as well as suspicious radiation-induced pneumonitis treated with steroids. The patient is currently undergoing consolidation treatment with immunotherapy with Imfinzi 1500 mg IV every 4 weeks status post 9 cycles.  He has been tolerating this treatment well with no concerning adverse effects. He had repeat CT scan of the chest performed recently.  I personally and independently reviewed the scans and discussed the results with the patient today. His scan showed no concerning findings for disease progression. I recommended for him to continue his current treatment with Imfinzi and he will proceed with cycle #10 today. He will come back for follow-up visit in 4 weeks for  evaluation before the next cycle of his treatment. He was advised to call immediately if he has any other concerning symptoms in the interval.  Disclaimer: This note was dictated with voice recognition software. Similar sounding words can inadvertently be transcribed and may be missed upon review. Eilleen Kempf, MD 05/12/20

## 2020-05-12 ENCOUNTER — Inpatient Hospital Stay (HOSPITAL_BASED_OUTPATIENT_CLINIC_OR_DEPARTMENT_OTHER): Payer: Federal, State, Local not specified - PPO | Admitting: Physician Assistant

## 2020-05-12 ENCOUNTER — Inpatient Hospital Stay: Payer: Federal, State, Local not specified - PPO

## 2020-05-12 ENCOUNTER — Other Ambulatory Visit: Payer: Self-pay

## 2020-05-12 ENCOUNTER — Encounter: Payer: Self-pay | Admitting: Physician Assistant

## 2020-05-12 VITALS — BP 129/89 | HR 81 | Temp 97.7°F | Ht 76.0 in | Wt 185.5 lb

## 2020-05-12 DIAGNOSIS — Z5112 Encounter for antineoplastic immunotherapy: Secondary | ICD-10-CM | POA: Diagnosis not present

## 2020-05-12 DIAGNOSIS — C3491 Malignant neoplasm of unspecified part of right bronchus or lung: Secondary | ICD-10-CM | POA: Diagnosis not present

## 2020-05-12 DIAGNOSIS — C3431 Malignant neoplasm of lower lobe, right bronchus or lung: Secondary | ICD-10-CM | POA: Diagnosis not present

## 2020-05-12 LAB — CMP (CANCER CENTER ONLY)
ALT: 19 U/L (ref 0–44)
AST: 22 U/L (ref 15–41)
Albumin: 4 g/dL (ref 3.5–5.0)
Alkaline Phosphatase: 77 U/L (ref 38–126)
Anion gap: 5 (ref 5–15)
BUN: 14 mg/dL (ref 8–23)
CO2: 25 mmol/L (ref 22–32)
Calcium: 9.5 mg/dL (ref 8.9–10.3)
Chloride: 107 mmol/L (ref 98–111)
Creatinine: 0.8 mg/dL (ref 0.61–1.24)
GFR, Est AFR Am: >60 mL/min (ref >60)
GFR, Estimated: >60 mL/min (ref >60)
Glucose, Bld: 91 mg/dL (ref 70–99)
Potassium: 4.1 mmol/L (ref 3.5–5.1)
Sodium: 137 mmol/L (ref 135–145)
Total Bilirubin: 1.5 mg/dL — ABNORMAL HIGH (ref 0.3–1.2)
Total Protein: 6.9 g/dL (ref 6.5–8.1)

## 2020-05-12 LAB — CBC WITH DIFFERENTIAL (CANCER CENTER ONLY)
Abs Immature Granulocytes: 0.01 10*3/uL (ref 0.00–0.07)
Basophils Absolute: 0 10*3/uL (ref 0.0–0.1)
Basophils Relative: 1 %
Eosinophils Absolute: 0.1 10*3/uL (ref 0.0–0.5)
Eosinophils Relative: 2 %
HCT: 42.3 % (ref 39.0–52.0)
Hemoglobin: 14.7 g/dL (ref 13.0–17.0)
Immature Granulocytes: 0 %
Lymphocytes Relative: 16 %
Lymphs Abs: 0.7 10*3/uL (ref 0.7–4.0)
MCH: 32 pg (ref 26.0–34.0)
MCHC: 34.8 g/dL (ref 30.0–36.0)
MCV: 92 fL (ref 80.0–100.0)
Monocytes Absolute: 0.5 10*3/uL (ref 0.1–1.0)
Monocytes Relative: 11 %
Neutro Abs: 3 10*3/uL (ref 1.7–7.7)
Neutrophils Relative %: 70 %
Platelet Count: 186 10*3/uL (ref 150–400)
RBC: 4.6 MIL/uL (ref 4.22–5.81)
RDW: 12.1 % (ref 11.5–15.5)
WBC Count: 4.3 10*3/uL (ref 4.0–10.5)
nRBC: 0 % (ref 0.0–0.2)

## 2020-05-12 MED ORDER — SODIUM CHLORIDE 0.9 % IV SOLN
Freq: Once | INTRAVENOUS | Status: AC
  Filled 2020-05-12: qty 250

## 2020-05-12 MED ORDER — SODIUM CHLORIDE 0.9 % IV SOLN
1500.0000 mg | Freq: Once | INTRAVENOUS | Status: AC
  Administered 2020-05-12: 1500 mg via INTRAVENOUS
  Filled 2020-05-12: qty 30

## 2020-05-12 NOTE — Patient Instructions (Signed)
Cancer Center Discharge Instructions for Patients Receiving Chemotherapy  Today you received the following chemotherapy agents: durvalumab.  To help prevent nausea and vomiting after your treatment, we encourage you to take your nausea medication as directed.   If you develop nausea and vomiting that is not controlled by your nausea medication, call the clinic.   BELOW ARE SYMPTOMS THAT SHOULD BE REPORTED IMMEDIATELY:  *FEVER GREATER THAN 100.5 F  *CHILLS WITH OR WITHOUT FEVER  NAUSEA AND VOMITING THAT IS NOT CONTROLLED WITH YOUR NAUSEA MEDICATION  *UNUSUAL SHORTNESS OF BREATH  *UNUSUAL BRUISING OR BLEEDING  TENDERNESS IN MOUTH AND THROAT WITH OR WITHOUT PRESENCE OF ULCERS  *URINARY PROBLEMS  *BOWEL PROBLEMS  UNUSUAL RASH Items with * indicate a potential emergency and should be followed up as soon as possible.  Feel free to call the clinic should you have any questions or concerns. The clinic phone number is [phone].  Please show the CHEMO ALERT CARD at check-in to the Emergency Department and triage nurse.   

## 2020-05-13 LAB — TSH: TSH: 2.46 u[IU]/mL (ref 0.320–4.118)

## 2020-06-07 NOTE — Progress Notes (Signed)
Alan [name]  Alan [name], L.Alan [name], [place] [address] Alan [name] [address]  DIAGNOSIS: Stage IIIb (T2b, N3, M0) non-small cell lung cancer, adenocarcinoma presented with right lower lobe lung nodule in addition to right hilar mass/node as well as ipsilateral and contralateral mediastinal lymphadenopathy diagnosed in October 2020.  Molecular studies by Guardant 360showed no actionable mutations.  PRIOR THERAPY: Concurrent chemoradiation with weekly carboplatin for AUC of 2 and paclitaxel 45 mg/M2. Status post 7 cycles. Last dose was given July 21, 2019  CURRENT THERAPY: Consolidation treatment with immunotherapy with Imfinzi 1500 mg.Status post10cycles.  INTERVAL HISTORY: Alan [name] 64 y.o. male returns to the clinic today for a follow-up visit.  The patient is feeling fairly well today without any concerning complaints except for some lightheadedness upon standing occassionally. He states his energy has improved since starting to take B vitamins. The patient denies any recent fever, chills, night sweats, or weight loss.  He denies any chest pain, shortness of breath, cough or hemoptysis.  He denies any nausea, vomiting, diarrhea, or constipation.  He denies any headache or visual changes. He is here today for evaluation before starting cycle #11.  MEDICAL HISTORY: Past Medical History:  Diagnosis Date  . CAD S/P percutaneous coronary angioplasty 2004   which showed essentially normal LV size and function, moderately dilated left atrium, moderate mitral prolapse with mild to moderate regurgitation.  . Dyslipidemia, goal LDL below 70   . Essential hypertension   . GERD (gastroesophageal reflux disease)   . History of Mitral valve prolapse    Moderate - with moderate MR, noted February t 2013  . History of Severe mitral regurgitation by prior echocardiogram 02/06/2014   TEE: Severe mitral regurgitation with a flail P2 segment and  ruptured; Normal LV size & function, dilated LA.  Marland Kitchen History of stress test 12/2009   he walked 9 mins reaching 10 METS. There was an attenuation artifact in the inferior region but no ischemia or infaract, low risk.  . Incidental lung nodule, greater than or equal to 12mm 03/16/2014   Ground glass opacity RML noted on CT scan  . lung ca dx'd 03/2019  . PONV (postoperative nausea and vomiting)   . 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 ST elevation myocardial infarction (STEMI) of anterior wall, subsequent episode of care [facility]) 2004   he had a proximal LAD occlusion treated with 2 overlapping 3.5 x 1.8 mm Cypher DES stents.    ALLERGIES:  is allergic to a-cillin [ampicillin], amoxicillin, other, penicillins, and sulfa antibiotics.  MEDICATIONS:  Current Outpatient Medications  Medication Sig Dispense Refill  . Ascorbic Acid (VITAMIN C) 1000 MG tablet Take 2,000 mg by mouth daily.     . Coenzyme Q10 (CO Q 10) 100 MG CAPS Take 100 mg by mouth daily.     Marland Kitchen MAGNESIUM PO Take 1 tablet by mouth at bedtime.    . Melatonin 10 MG TABS Take 10 mg by mouth at bedtime.    . rosuvastatin (CRESTOR) 10 MG tablet Take 1 tablet (10 mg total) by mouth daily. 90 tablet 3  . Specialty Vitamins Products (PROSTATE PO) Take 1 capsule by mouth daily. PROSTATE FORMULA    . vitamin B-12 (CYANOCOBALAMIN) 1000 MCG  tablet Take 1,000 mcg by mouth daily.    . Vitamin D-Vitamin K (D3 + K2 DOTS PO) Take 1 tablet by mouth daily.     No current facility-administered medications for this visit.    SURGICAL HISTORY:  Past Surgical History:  Procedure Laterality Date  . ANTERIOR CRUCIATE LIGAMENT REPAIR Left 1993  . CARDIAC CATHETERIZATION  2004   he  had a proximal LAD occlusion treated with 2 overlapping 3.5 x 1.8 mm Cypher DES stents.  Marland Kitchen CARDIAC CATHETERIZATION  2007; 03/2014   Widely patent LAD stents, 40% distal LAD, 30% Cx, ~50% PL-PDA bifurcation lesion   . COLONOSCOPY    . INTRAOPERATIVE TRANSESOPHAGEAL ECHOCARDIOGRAM N/A 04/08/2014   Procedure: INTRAOPERATIVE TRANSESOPHAGEAL ECHOCARDIOGRAM;  Surgeon: Alan Alberts, MD;  Location: [place];  Service: Open Heart Surgery;  Laterality: N/A;  . LEFT AND RIGHT HEART CATHETERIZATION WITH CORONARY ANGIOGRAM N/A 03/05/2014   Procedure: LEFT AND RIGHT HEART CATHETERIZATION WITH CORONARY ANGIOGRAM;  Surgeon: Alan Man, MD;  Location: [facility] CATH LAB;  Service: Cardiovascular;  Laterality: N/A;  . LEFT HEART CATHETERIZATION WITH CORONARY ANGIOGRAM N/A 09/15/2011   Procedure: LEFT HEART CATHETERIZATION WITH CORONARY ANGIOGRAM;  Surgeon: Alan Harp, MD;  Location: [facility] CATH LAB;  Service: Cardiovascular;  Laterality: N/A;  . MITRAL VALVE REPAIR Right 04/08/2014   Procedure: MINIMALLY INVASIVE MITRAL VALVE REPAIR (MVR);  Surgeon: Alan Alberts, MD;  Location: [place];  Service: Open Heart Surgery;  Laterality: Right;  . NM MYOVIEW LTD  May 2011   Walk 9 minutes, and 10 METs, diaphragmatic attenuation but no ischemia or infarction.  . SVT ABLATION N/A 10/17/2019   Procedure: SVT ABLATION;  Surgeon: Alan Haw, MD;  Location: [place] CV LAB;  Service: Cardiovascular;  Laterality: N/A;  . TEE WITHOUT CARDIOVERSION N/A 02/06/2014   Procedure: TRANSESOPHAGEAL ECHOCARDIOGRAM (TEE);  Surgeon: Alan Casino, MD;  Normal LV Size U& function - EF 55-60%, no regional WMA.  MV P2 Leaflet is flail with ruptured chord with severe prolapse, anterior leaflet intact.  Severe, eccentric anterior directed MR with dilated LA.  Marland Kitchen TRANSTHORACIC ECHOCARDIOGRAM  07/18/2018   Normal LV size and function.  EF 50-60 %.  Unable to assess diastolic function.  Mitral valve sewing ring in place.  Mild stenosis with a  gradient of 6 mmHg noted. -  Alan [name] ECHOCARDIOGRAM  June 30 2015   Low normal LV function with EF 50-55%. GR 1 DD. No MR. Normal gradient of 4 mmHg the mitral valve. No prolapse.  Marland Kitchen VIDEO BRONCHOSCOPY WITH ENDOBRONCHIAL ULTRASOUND N/A 05/16/2019   Procedure: VIDEO BRONCHOSCOPY WITH ENDOBRONCHIAL ULTRASOUND;  Surgeon: Marshell Garfinkel, MD;  Location: [place];  Service: Pulmonary;  Laterality: N/A;    REVIEW OF SYSTEMS:   Review of Systems  Constitutional: Negative for appetite change, chills, fatigue, fever and unexpected weight change.  HENT: Negative for mouth sores, nosebleeds, sore throat and trouble swallowing.   Eyes: Negative for eye problems and icterus.  Respiratory: Negative for cough, hemoptysis, shortness of breath and wheezing.   Cardiovascular: Negative for chest pain and leg swelling.  Gastrointestinal: Negative for abdominal pain, constipation, diarrhea, nausea and vomiting.  Genitourinary: Negative for bladder incontinence, difficulty urinating, dysuria, frequency and hematuria.   Musculoskeletal: Negative for back pain, gait problem, neck pain and neck stiffness.  Skin: Negative for itching and rash.  Neurological: Positive for lightheadedness upon standing. Negative for dizziness, extremity weakness, gait problem, headaches, and seizures.  Hematological: Negative for adenopathy. Does not bruise/bleed  easily.  Psychiatric/Behavioral: Negative for confusion, depression and sleep disturbance. The patient is not nervous/anxious.     PHYSICAL EXAMINATION:  Blood pressure 121/87, pulse 95, temperature (!) 96.3 F (35.7 C), temperature source Tympanic, resp. rate 18, height 6\' 4"  (1.93 m), weight 187 lb 4.8 oz (85 kg), SpO2 98 %.  ECOG PERFORMANCE STATUS: 1 - Symptomatic but completely ambulatory  Physical Exam  Constitutional: Oriented to person, place, and time and well-developed, well-nourished, and in no distress.  HENT:  Head: Normocephalic and atraumatic.   Mouth/Throat: Oropharynx is clear and moist. No oropharyngeal exudate.  Eyes: Conjunctivae are normal. Right eye exhibits no discharge. Left eye exhibits no discharge. No scleral icterus.  Neck: Normal range of motion. Neck supple.  Cardiovascular: Normal rate, regular rhythm, normal heart sounds and intact distal pulses.   Pulmonary/Chest: Effort normal and breath sounds normal. No respiratory distress. No wheezes. No rales.  Abdominal: Soft. Bowel sounds are normal. Exhibits no distension and no mass. There is no tenderness.  Musculoskeletal: Normal range of motion. Exhibits no edema.  Lymphadenopathy:    No cervical adenopathy.  Neurological: Alert and oriented to person, place, and time. Exhibits normal muscle tone. Gait normal. Coordination normal.  Skin: Skin is warm and dry. No rash noted. Not diaphoretic. No erythema. No pallor.  Psychiatric: Mood, memory and judgment normal.  Vitals reviewed.  LABORATORY DATA: Lab Results  Component Value Date   WBC 4.3 06/09/2020   HGB 15.1 06/09/2020   HCT 43.8 06/09/2020   MCV 90.9 06/09/2020   PLT 200 06/09/2020      Chemistry      Component Value Date/Time   NA 140 06/09/2020 0929   NA 141 02/25/2020 0915   K 4.1 06/09/2020 0929   CL 107 06/09/2020 0929   CO2 25 06/09/2020 0929   BUN 15 06/09/2020 0929   BUN 14 02/25/2020 0915   CREATININE 0.76 06/09/2020 0929   CREATININE 0.80 02/27/2014 1152      Component Value Date/Time   CALCIUM 9.9 06/09/2020 0929   ALKPHOS 87 06/09/2020 0929   AST 22 06/09/2020 0929   ALT 19 06/09/2020 0929   BILITOT 1.2 06/09/2020 0929       RADIOGRAPHIC STUDIES:  No results found.   ASSESSMENT/PLAN:  This is a very pleasant 64 year old Caucasian male diagnosed with stage IIIb non-small cell lung cancer, adenocarcinoma. He presented with a rightlower lobe lung mass and right hilar lymphadenopathy and ipsilateral and contralateral mediastinal lymphadenopathy. He has no actionable  mutations. He was diagnosed in October 2020.  He is status post 7 cycles of concurrent chemoradiation with carboplatin for an AUC of 2 and paclitaxel 45 mg/m2. He experienced odynophagia, dysphagia, and radiation induced esophagitis. He was placed on a prednisone taperand is almost finished with his course.He is feeling better at this time.  The patient is currently undergoing treatment with Imfinzi 1500 mg IV every 4 weeks.Status post 10 cycles.  Labs were reviewed. Recommend that he proceed with cycle #11 today as scheduled.   We will see him back for a follow up visit in 4 weeks for evaluation before starting cycle #12.  The patient was advised to call immediately if he has any concerning symptoms in the interval. The patient voices understanding of current disease status and treatment options and is in agreement with the current care plan. All questions were answered. The patient knows to call the clinic with any problems, questions or concerns. We can certainly see the patient much sooner if  necessary     No orders of the defined types were placed in this encounter.    Hollie Wojahn L Kasheem Toner, PA-C 06/09/20

## 2020-06-09 ENCOUNTER — Other Ambulatory Visit: Payer: Self-pay

## 2020-06-09 ENCOUNTER — Inpatient Hospital Stay: Payer: Federal, State, Local not specified - PPO

## 2020-06-09 ENCOUNTER — Encounter: Payer: Self-pay | Admitting: Physician Assistant

## 2020-06-09 ENCOUNTER — Inpatient Hospital Stay: Payer: Federal, State, Local not specified - PPO | Attending: Internal Medicine | Admitting: Physician Assistant

## 2020-06-09 VITALS — BP 121/87 | HR 95 | Temp 96.3°F | Resp 18 | Ht 76.0 in | Wt 187.3 lb

## 2020-06-09 DIAGNOSIS — C3491 Malignant neoplasm of unspecified part of right bronchus or lung: Secondary | ICD-10-CM

## 2020-06-09 DIAGNOSIS — Z5112 Encounter for antineoplastic immunotherapy: Secondary | ICD-10-CM | POA: Diagnosis present

## 2020-06-09 DIAGNOSIS — C3431 Malignant neoplasm of lower lobe, right bronchus or lung: Secondary | ICD-10-CM | POA: Diagnosis present

## 2020-06-09 DIAGNOSIS — Z79899 Other long term (current) drug therapy: Secondary | ICD-10-CM | POA: Insufficient documentation

## 2020-06-09 LAB — CMP (CANCER CENTER ONLY)
ALT: 19 U/L (ref 0–44)
AST: 22 U/L (ref 15–41)
Albumin: 4.1 g/dL (ref 3.5–5.0)
Alkaline Phosphatase: 87 U/L (ref 38–126)
Anion gap: 8 (ref 5–15)
BUN: 15 mg/dL (ref 8–23)
CO2: 25 mmol/L (ref 22–32)
Calcium: 9.9 mg/dL (ref 8.9–10.3)
Chloride: 107 mmol/L (ref 98–111)
Creatinine: 0.76 mg/dL (ref 0.61–1.24)
GFR, Estimated: >60 mL/min (ref >60)
Glucose, Bld: 93 mg/dL (ref 70–99)
Potassium: 4.1 mmol/L (ref 3.5–5.1)
Sodium: 140 mmol/L (ref 135–145)
Total Bilirubin: 1.2 mg/dL (ref 0.3–1.2)
Total Protein: 6.9 g/dL (ref 6.5–8.1)

## 2020-06-09 LAB — CBC WITH DIFFERENTIAL (CANCER CENTER ONLY)
Abs Immature Granulocytes: 0.02 10*3/uL (ref 0.00–0.07)
Basophils Absolute: 0 10*3/uL (ref 0.0–0.1)
Basophils Relative: 1 %
Eosinophils Absolute: 0.1 10*3/uL (ref 0.0–0.5)
Eosinophils Relative: 2 %
HCT: 43.8 % (ref 39.0–52.0)
Hemoglobin: 15.1 g/dL (ref 13.0–17.0)
Immature Granulocytes: 1 %
Lymphocytes Relative: 16 %
Lymphs Abs: 0.7 10*3/uL (ref 0.7–4.0)
MCH: 31.3 pg (ref 26.0–34.0)
MCHC: 34.5 g/dL (ref 30.0–36.0)
MCV: 90.9 fL (ref 80.0–100.0)
Monocytes Absolute: 0.5 10*3/uL (ref 0.1–1.0)
Monocytes Relative: 11 %
Neutro Abs: 3 10*3/uL (ref 1.7–7.7)
Neutrophils Relative %: 69 %
Platelet Count: 200 10*3/uL (ref 150–400)
RBC: 4.82 MIL/uL (ref 4.22–5.81)
RDW: 12 % (ref 11.5–15.5)
WBC Count: 4.3 10*3/uL (ref 4.0–10.5)
nRBC: 0 % (ref 0.0–0.2)

## 2020-06-09 LAB — TSH: TSH: 2.327 u[IU]/mL (ref 0.320–4.118)

## 2020-06-09 MED ORDER — SODIUM CHLORIDE 0.9 % IV SOLN
1500.0000 mg | Freq: Once | INTRAVENOUS | Status: AC
  Administered 2020-06-09: 1500 mg via INTRAVENOUS
  Filled 2020-06-09: qty 30

## 2020-06-09 MED ORDER — SODIUM CHLORIDE 0.9 % IV SOLN
Freq: Once | INTRAVENOUS | Status: AC
  Filled 2020-06-09: qty 250

## 2020-06-09 NOTE — Patient Instructions (Signed)
[facility] Discharge Instructions for Patients Receiving Chemotherapy  Today you received the following chemotherapy agents: durvalumab.  To help prevent nausea and vomiting after your treatment, we encourage you to take your nausea medication as directed.   If you develop nausea and vomiting that is not controlled by your nausea medication, call the clinic.   BELOW ARE SYMPTOMS THAT SHOULD BE REPORTED IMMEDIATELY:  *FEVER GREATER THAN 100.5 F  *CHILLS WITH OR WITHOUT FEVER  NAUSEA AND VOMITING THAT IS NOT CONTROLLED WITH YOUR NAUSEA MEDICATION  *UNUSUAL SHORTNESS OF BREATH  *UNUSUAL BRUISING OR BLEEDING  TENDERNESS IN MOUTH AND THROAT WITH OR WITHOUT PRESENCE OF ULCERS  *URINARY PROBLEMS  *BOWEL PROBLEMS  UNUSUAL RASH Items with * indicate a potential emergency and should be followed up as soon as possible.  Feel free to call the clinic should you have any questions or concerns. The clinic phone number is [phone].  Please show the CHEMO ALERT CARD at check-in to the Emergency Department and triage nurse.   

## 2020-06-10 ENCOUNTER — Telehealth: Payer: Self-pay | Admitting: Internal Medicine

## 2020-06-10 NOTE — Telephone Encounter (Signed)
Pt request to move appt from 11/24 to the following week due to being out of town,. Rescheduled per pt request.

## 2020-07-07 ENCOUNTER — Ambulatory Visit: Payer: Federal, State, Local not specified - PPO | Admitting: Internal Medicine

## 2020-07-07 ENCOUNTER — Ambulatory Visit: Payer: Federal, State, Local not specified - PPO

## 2020-07-07 ENCOUNTER — Other Ambulatory Visit: Payer: Federal, State, Local not specified - PPO

## 2020-07-14 ENCOUNTER — Inpatient Hospital Stay: Payer: Federal, State, Local not specified - PPO | Attending: Internal Medicine

## 2020-07-14 ENCOUNTER — Encounter: Payer: Self-pay | Admitting: Internal Medicine

## 2020-07-14 ENCOUNTER — Other Ambulatory Visit: Payer: Self-pay

## 2020-07-14 ENCOUNTER — Inpatient Hospital Stay: Payer: Federal, State, Local not specified - PPO

## 2020-07-14 ENCOUNTER — Inpatient Hospital Stay (HOSPITAL_BASED_OUTPATIENT_CLINIC_OR_DEPARTMENT_OTHER): Payer: Federal, State, Local not specified - PPO | Admitting: Internal Medicine

## 2020-07-14 VITALS — BP 112/84 | HR 91 | Temp 97.7°F | Resp 18 | Ht 76.0 in | Wt 190.2 lb

## 2020-07-14 DIAGNOSIS — C3491 Malignant neoplasm of unspecified part of right bronchus or lung: Secondary | ICD-10-CM

## 2020-07-14 DIAGNOSIS — Z5112 Encounter for antineoplastic immunotherapy: Secondary | ICD-10-CM | POA: Insufficient documentation

## 2020-07-14 DIAGNOSIS — Z79899 Other long term (current) drug therapy: Secondary | ICD-10-CM | POA: Insufficient documentation

## 2020-07-14 DIAGNOSIS — C3431 Malignant neoplasm of lower lobe, right bronchus or lung: Secondary | ICD-10-CM | POA: Diagnosis present

## 2020-07-14 LAB — CBC WITH DIFFERENTIAL (CANCER CENTER ONLY)
Abs Immature Granulocytes: 0.01 10*3/uL (ref 0.00–0.07)
Basophils Absolute: 0 10*3/uL (ref 0.0–0.1)
Basophils Relative: 1 %
Eosinophils Absolute: 0.2 10*3/uL (ref 0.0–0.5)
Eosinophils Relative: 4 %
HCT: 43.5 % (ref 39.0–52.0)
Hemoglobin: 14.8 g/dL (ref 13.0–17.0)
Immature Granulocytes: 0 %
Lymphocytes Relative: 14 %
Lymphs Abs: 0.7 10*3/uL (ref 0.7–4.0)
MCH: 31.6 pg (ref 26.0–34.0)
MCHC: 34 g/dL (ref 30.0–36.0)
MCV: 92.9 fL (ref 80.0–100.0)
Monocytes Absolute: 0.6 10*3/uL (ref 0.1–1.0)
Monocytes Relative: 12 %
Neutro Abs: 3.3 10*3/uL (ref 1.7–7.7)
Neutrophils Relative %: 69 %
Platelet Count: 187 10*3/uL (ref 150–400)
RBC: 4.68 MIL/uL (ref 4.22–5.81)
RDW: 12.2 % (ref 11.5–15.5)
WBC Count: 4.8 10*3/uL (ref 4.0–10.5)
nRBC: 0 % (ref 0.0–0.2)

## 2020-07-14 LAB — CMP (CANCER CENTER ONLY)
ALT: 18 U/L (ref 0–44)
AST: 20 U/L (ref 15–41)
Albumin: 4.1 g/dL (ref 3.5–5.0)
Alkaline Phosphatase: 87 U/L (ref 38–126)
Anion gap: 8 (ref 5–15)
BUN: 14 mg/dL (ref 8–23)
CO2: 25 mmol/L (ref 22–32)
Calcium: 9.7 mg/dL (ref 8.9–10.3)
Chloride: 108 mmol/L (ref 98–111)
Creatinine: 0.77 mg/dL (ref 0.61–1.24)
GFR, Estimated: >60 mL/min (ref >60)
Glucose, Bld: 93 mg/dL (ref 70–99)
Potassium: 4 mmol/L (ref 3.5–5.1)
Sodium: 141 mmol/L (ref 135–145)
Total Bilirubin: 1.4 mg/dL — ABNORMAL HIGH (ref 0.3–1.2)
Total Protein: 6.9 g/dL (ref 6.5–8.1)

## 2020-07-14 LAB — TSH: TSH: 2.44 u[IU]/mL (ref 0.320–4.118)

## 2020-07-14 MED ORDER — SODIUM CHLORIDE 0.9 % IV SOLN
Freq: Once | INTRAVENOUS | Status: AC
  Filled 2020-07-14: qty 250

## 2020-07-14 MED ORDER — SODIUM CHLORIDE 0.9 % IV SOLN
1500.0000 mg | Freq: Once | INTRAVENOUS | Status: AC
  Administered 2020-07-14: 1500 mg via INTRAVENOUS
  Filled 2020-07-14: qty 30

## 2020-07-14 NOTE — Patient Instructions (Signed)
[place] Discharge Instructions for Patients Receiving Chemotherapy  Today you received the following monoclonal antibody agent Durvalumab (IMFINZI).  To help prevent nausea and vomiting after your treatment, we encourage you to take your nausea medication as prescribed.   If you develop nausea and vomiting that is not controlled by your nausea medication, call the clinic.   BELOW ARE SYMPTOMS THAT SHOULD BE REPORTED IMMEDIATELY:  *FEVER GREATER THAN 100.5 F  *CHILLS WITH OR WITHOUT FEVER  NAUSEA AND VOMITING THAT IS NOT CONTROLLED WITH YOUR NAUSEA MEDICATION  *UNUSUAL SHORTNESS OF BREATH  *UNUSUAL BRUISING OR BLEEDING  TENDERNESS IN MOUTH AND THROAT WITH OR WITHOUT PRESENCE OF ULCERS  *URINARY PROBLEMS  *BOWEL PROBLEMS  UNUSUAL RASH Items with * indicate a potential emergency and should be followed up as soon as possible.  Feel free to call the clinic should you have any questions or concerns. The clinic phone number is [phone].  Please show the [place] at check-in to the Emergency Department and triage nurse.

## 2020-07-14 NOTE — Progress Notes (Signed)
[place] [phone]   [phone]  OFFICE PROGRESS NOTE  [name], L.[name], [place] [address] [place] [address]  DIAGNOSIS: stage IIIb (T2b, N3, M0) non-small cell lung cancer, adenocarcinoma presented with right lower lobe lung nodule in addition to right hilar mass/node as well as ipsilateral and contralateral mediastinal lymphadenopathy diagnosed in October 2020.  Molecular studies by Guardant 360 showed no actionable mutations.  PRIOR THERAPY: Concurrent chemoradiation with weekly carboplatin for AUC of 2 and paclitaxel 45 mg/M2.  Status post 7 cycles.  Last dose was given July 21, 2019  CURRENT THERAPY: Consolidation treatment with immunotherapy with Imfinzi 1500 mg.  Status post 11 cycles.  INTERVAL HISTORY: DAEL HOWLAND 64 y.o. male returns to the clinic today for follow-up visit.  The patient is feeling fine today with no concerning complaints.  He denied having any current chest pain, shortness of breath, cough or hemoptysis.  He denied having any fever or chills.  He has no nausea, vomiting, diarrhea or constipation.  He denied having any headache or visual changes.  He notes some intermittent changes in the color of his urine.  The patient denied having any fever or chills.  He is here today for evaluation before starting cycle #12 of his treatment.   MEDICAL HISTORY: Past Medical History:  Diagnosis Date  . CAD S/P percutaneous coronary angioplasty 2004   which showed essentially normal LV size and function, moderately dilated left atrium, moderate mitral prolapse with mild to moderate regurgitation.  . Dyslipidemia, goal LDL below 70   . Essential hypertension   . GERD (gastroesophageal reflux disease)   . History of Mitral valve prolapse    Moderate - with moderate MR, noted February t 2013  . History of Severe mitral regurgitation by prior echocardiogram 02/06/2014   TEE: Severe mitral regurgitation with  a flail P2 segment and ruptured; Normal LV size & function, dilated LA.  Marland Kitchen History of stress test 12/2009   he walked 9 mins reaching 10 METS. There was an attenuation artifact in the inferior region but no ischemia or infaract, low risk.  . Incidental lung nodule, greater than or equal to 70mm 03/16/2014   Ground glass opacity RML noted on CT scan  . lung ca dx'd 03/2019  . PONV (postoperative nausea and vomiting)   . 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 ST elevation myocardial infarction (STEMI) of anterior wall, subsequent episode of care [facility]) 2004   he had a proximal LAD occlusion treated with 2 overlapping 3.5 x 1.8 mm Cypher DES stents.    ALLERGIES:  is allergic to a-cillin [ampicillin], amoxicillin, other, penicillins, and sulfa antibiotics.  MEDICATIONS:  Current Outpatient Medications  Medication Sig Dispense Refill  . Ascorbic Acid (VITAMIN C) 1000 MG tablet Take 2,000 mg by mouth daily.     . Coenzyme Q10 (CO Q 10) 100 MG CAPS Take 100 mg by mouth daily.     Marland Kitchen MAGNESIUM PO Take 1 tablet by mouth at bedtime.    . Melatonin 10 MG TABS Take 10 mg by mouth at bedtime.    . rosuvastatin (CRESTOR) 10 MG tablet Take 1 tablet (10 mg total) by mouth daily. 90 tablet 3  .  Specialty Vitamins Products (PROSTATE PO) Take 1 capsule by mouth daily. PROSTATE FORMULA    . vitamin B-12 (CYANOCOBALAMIN) 1000 MCG tablet Take 1,000 mcg by mouth daily.    . Vitamin D-Vitamin K (D3 + K2 DOTS PO) Take 1 tablet by mouth daily.     No current facility-administered medications for this visit.    SURGICAL HISTORY:  Past Surgical History:  Procedure Laterality Date  . ANTERIOR CRUCIATE LIGAMENT REPAIR Left 1993  . CARDIAC  CATHETERIZATION  2004   he had a proximal LAD occlusion treated with 2 overlapping 3.5 x 1.8 mm Cypher DES stents.  Marland Kitchen CARDIAC CATHETERIZATION  2007; 03/2014   Widely patent LAD stents, 40% distal LAD, 30% Cx, ~50% PL-PDA bifurcation lesion   . COLONOSCOPY    . INTRAOPERATIVE TRANSESOPHAGEAL ECHOCARDIOGRAM N/A 04/08/2014   Procedure: INTRAOPERATIVE TRANSESOPHAGEAL ECHOCARDIOGRAM;  Surgeon: Rexene Alberts, MD;  Location: [place];  Service: Open Heart Surgery;  Laterality: N/A;  . LEFT AND RIGHT HEART CATHETERIZATION WITH CORONARY ANGIOGRAM N/A 03/05/2014   Procedure: LEFT AND RIGHT HEART CATHETERIZATION WITH CORONARY ANGIOGRAM;  Surgeon: Leonie Man, MD;  Location: [facility] CATH LAB;  Service: Cardiovascular;  Laterality: N/A;  . LEFT HEART CATHETERIZATION WITH CORONARY ANGIOGRAM N/A 09/15/2011   Procedure: LEFT HEART CATHETERIZATION WITH CORONARY ANGIOGRAM;  Surgeon: Lorretta Harp, MD;  Location: [facility] CATH LAB;  Service: Cardiovascular;  Laterality: N/A;  . MITRAL VALVE REPAIR Right 04/08/2014   Procedure: MINIMALLY INVASIVE MITRAL VALVE REPAIR (MVR);  Surgeon: Rexene Alberts, MD;  Location: [place];  Service: Open Heart Surgery;  Laterality: Right;  . NM MYOVIEW LTD  May 2011   Walk 9 minutes, and 10 METs, diaphragmatic attenuation but no ischemia or infarction.  . SVT ABLATION N/A 10/17/2019   Procedure: SVT ABLATION;  Surgeon: Constance Haw, MD;  Location: [place] CV LAB;  Service: Cardiovascular;  Laterality: N/A;  . TEE WITHOUT CARDIOVERSION N/A 02/06/2014   Procedure: TRANSESOPHAGEAL ECHOCARDIOGRAM (TEE);  Surgeon: Pixie Casino, MD;  Normal LV Size U& function - EF 55-60%, no regional WMA.  MV P2 Leaflet is flail with ruptured chord with severe prolapse, anterior leaflet intact.  Severe, eccentric anterior directed MR with dilated LA.  Marland Kitchen TRANSTHORACIC ECHOCARDIOGRAM  07/18/2018   Normal LV size and function.  EF 50-60 %.  Unable to assess diastolic function.  Mitral valve sewing ring in  place.  Mild stenosis with a gradient of 6 mmHg noted. -  Domingo Dimes ECHOCARDIOGRAM  June 30 2015   Low normal LV function with EF 50-55%. GR 1 DD. No MR. Normal gradient of 4 mmHg the mitral valve. No prolapse.  Marland Kitchen VIDEO BRONCHOSCOPY WITH ENDOBRONCHIAL ULTRASOUND N/A 05/16/2019   Procedure: VIDEO BRONCHOSCOPY WITH ENDOBRONCHIAL ULTRASOUND;  Surgeon: Marshell Garfinkel, MD;  Location: [place];  Service: Pulmonary;  Laterality: N/A;    REVIEW OF SYSTEMS:  A comprehensive review of systems was negative.   PHYSICAL EXAMINATION: General appearance: alert, cooperative and no distress Head: Normocephalic, without obvious abnormality, atraumatic Neck: no adenopathy, no JVD, supple, symmetrical, trachea midline and thyroid not enlarged, symmetric, no tenderness/mass/nodules Lymph nodes: Cervical, supraclavicular, and axillary nodes normal. Resp: clear to auscultation bilaterally Back: symmetric, no curvature. ROM normal. No CVA tenderness. Cardio: regular rate and rhythm, S1, S2 normal, no murmur, click, rub or gallop GI: soft, non-tender; bowel sounds normal; no masses,  no organomegaly Extremities: extremities normal, atraumatic, no cyanosis or edema  ECOG PERFORMANCE STATUS: 1 - Symptomatic but completely ambulatory  Blood pressure 112/84, pulse 91, temperature 97.7 F (36.5 C), temperature source Tympanic, resp. rate 18, height 6\' 4"  (1.93 m), weight 190 lb 3.2 oz (86.3 kg), SpO2 100 %.  LABORATORY DATA: Lab Results  Component Value Date   WBC 4.8 07/14/2020   HGB 14.8 07/14/2020   HCT 43.5 07/14/2020   MCV 92.9 07/14/2020   PLT 187 07/14/2020      Chemistry      Component Value Date/Time   NA 141 07/14/2020 0746   NA 141 02/25/2020 0915   K 4.0 07/14/2020 0746   CL 108 07/14/2020 0746   CO2 25 07/14/2020 0746   BUN 14 07/14/2020 0746   BUN 14 02/25/2020 0915   CREATININE 0.77 07/14/2020 0746   CREATININE 0.80 02/27/2014 1152      Component Value Date/Time   CALCIUM 9.7  07/14/2020 0746   ALKPHOS 87 07/14/2020 0746   AST 20 07/14/2020 0746   ALT 18 07/14/2020 0746   BILITOT 1.4 (H) 07/14/2020 0746       RADIOGRAPHIC STUDIES: No results found.  ASSESSMENT AND PLAN: This is a very pleasant 64 years old white male recently diagnosed with a stage IIIb non-small cell lung cancer, adenocarcinoma with no actionable mutation.  He completed a course of concurrent chemoradiation with weekly carboplatin and paclitaxel status post 7 cycles.   His treatment was complicated with dysphagia, odynophagia secondary to radiation induced esophagitis.  He also has persistent cough and shortness of breath secondary to radiation-induced pneumonitis. He has partial response after this treatment. The patient is currently undergoing treatment with consolidation immunotherapy with Imfinzi 1500 mg IV every 4 weeks is status post 11 cycles. The patient continues to tolerate this treatment well with no concerning adverse effects. I recommended for him to proceed with cycle #12 today as planned. I will see him back for follow-up visit in 5 weeks for evaluation before the last cycle of his treatment.  He will be out of town in 4 weeks and requested a delay by 1 week until he returns back home. The patient was advised to call immediately if he has any concerning symptoms in the interval. The patient voices understanding of current disease status and treatment options and is in agreement with the current care plan. All questions were answered. The patient knows to call the clinic with any problems, questions or concerns. We can certainly see the patient much sooner if necessary.  Disclaimer: This note was dictated with voice recognition software. Similar sounding words can inadvertently be transcribed and may not be corrected upon review.

## 2020-07-15 ENCOUNTER — Telehealth: Payer: Self-pay | Admitting: Internal Medicine

## 2020-07-15 NOTE — Telephone Encounter (Signed)
Scheduled per los. Called and spoke with patient. Confirmed appt. Cancelled 12/22 appt per provider

## 2020-08-04 ENCOUNTER — Ambulatory Visit: Payer: Federal, State, Local not specified - PPO

## 2020-08-04 ENCOUNTER — Ambulatory Visit: Payer: Federal, State, Local not specified - PPO | Admitting: Physician Assistant

## 2020-08-04 ENCOUNTER — Other Ambulatory Visit: Payer: Federal, State, Local not specified - PPO

## 2020-08-19 ENCOUNTER — Other Ambulatory Visit: Payer: Self-pay

## 2020-08-19 ENCOUNTER — Inpatient Hospital Stay: Payer: Federal, State, Local not specified - PPO

## 2020-08-19 ENCOUNTER — Inpatient Hospital Stay: Payer: Federal, State, Local not specified - PPO | Attending: Internal Medicine | Admitting: Internal Medicine

## 2020-08-19 DIAGNOSIS — Z79899 Other long term (current) drug therapy: Secondary | ICD-10-CM | POA: Insufficient documentation

## 2020-08-19 DIAGNOSIS — C3491 Malignant neoplasm of unspecified part of right bronchus or lung: Secondary | ICD-10-CM

## 2020-08-19 DIAGNOSIS — Z5112 Encounter for antineoplastic immunotherapy: Secondary | ICD-10-CM | POA: Insufficient documentation

## 2020-08-19 DIAGNOSIS — C3431 Malignant neoplasm of lower lobe, right bronchus or lung: Secondary | ICD-10-CM | POA: Insufficient documentation

## 2020-08-19 DIAGNOSIS — C3490 Malignant neoplasm of unspecified part of unspecified bronchus or lung: Secondary | ICD-10-CM

## 2020-08-19 LAB — CBC WITH DIFFERENTIAL (CANCER CENTER ONLY)
Abs Immature Granulocytes: 0.01 10*3/uL (ref 0.00–0.07)
Basophils Absolute: 0 10*3/uL (ref 0.0–0.1)
Basophils Relative: 1 %
Eosinophils Absolute: 0.1 10*3/uL (ref 0.0–0.5)
Eosinophils Relative: 2 %
HCT: 43.3 % (ref 39.0–52.0)
Hemoglobin: 14.7 g/dL (ref 13.0–17.0)
Immature Granulocytes: 0 %
Lymphocytes Relative: 16 %
Lymphs Abs: 0.8 10*3/uL (ref 0.7–4.0)
MCH: 31.6 pg (ref 26.0–34.0)
MCHC: 33.9 g/dL (ref 30.0–36.0)
MCV: 93.1 fL (ref 80.0–100.0)
Monocytes Absolute: 0.6 10*3/uL (ref 0.1–1.0)
Monocytes Relative: 11 %
Neutro Abs: 3.4 10*3/uL (ref 1.7–7.7)
Neutrophils Relative %: 70 %
Platelet Count: 204 10*3/uL (ref 150–400)
RBC: 4.65 MIL/uL (ref 4.22–5.81)
RDW: 12.1 % (ref 11.5–15.5)
WBC Count: 4.9 10*3/uL (ref 4.0–10.5)
nRBC: 0 % (ref 0.0–0.2)

## 2020-08-19 LAB — TSH: TSH: 1.928 u[IU]/mL (ref 0.320–4.118)

## 2020-08-19 LAB — CMP (CANCER CENTER ONLY)
ALT: 23 U/L (ref 0–44)
AST: 26 U/L (ref 15–41)
Albumin: 4.1 g/dL (ref 3.5–5.0)
Alkaline Phosphatase: 83 U/L (ref 38–126)
Anion gap: 9 (ref 5–15)
BUN: 15 mg/dL (ref 8–23)
CO2: 22 mmol/L (ref 22–32)
Calcium: 9.7 mg/dL (ref 8.9–10.3)
Chloride: 109 mmol/L (ref 98–111)
Creatinine: 0.78 mg/dL (ref 0.61–1.24)
GFR, Estimated: >60 mL/min (ref >60)
Glucose, Bld: 99 mg/dL (ref 70–99)
Potassium: 4.1 mmol/L (ref 3.5–5.1)
Sodium: 140 mmol/L (ref 135–145)
Total Bilirubin: 1.5 mg/dL — ABNORMAL HIGH (ref 0.3–1.2)
Total Protein: 7 g/dL (ref 6.5–8.1)

## 2020-08-19 MED ORDER — SODIUM CHLORIDE 0.9 % IV SOLN
Freq: Once | INTRAVENOUS | Status: AC
  Filled 2020-08-19: qty 250

## 2020-08-19 MED ORDER — DURVALUMAB 500 MG/10ML IV SOLN
1500.0000 mg | Freq: Once | INTRAVENOUS | Status: AC
  Administered 2020-08-19: 1500 mg via INTRAVENOUS
  Filled 2020-08-19: qty 30

## 2020-08-19 NOTE — Progress Notes (Signed)
[place] [phone]   [phone]  OFFICE PROGRESS NOTE  [name], L.[name], [place] [address] [place] [address]  DIAGNOSIS: stage IIIb (T2b, N3, M0) non-small cell lung cancer, adenocarcinoma presented with right lower lobe lung nodule in addition to right hilar mass/node as well as ipsilateral and contralateral mediastinal lymphadenopathy diagnosed in October 2020.  Molecular studies by Guardant 360 showed no actionable mutations.  PRIOR THERAPY: Concurrent chemoradiation with weekly carboplatin for AUC of 2 and paclitaxel 45 mg/M2.  Status post 7 cycles.  Last dose was given July 21, 2019  CURRENT THERAPY: Consolidation treatment with immunotherapy with Imfinzi 1500 mg.  Status post 12 cycles.  INTERVAL HISTORY: Alan [name] 65 y.o. male returns to the clinic today for follow-up visit.  The patient is feeling fine today with no concerning complaints.  He had a good Christmas celebration with his family.  He denied having any current chest pain, shortness of breath, cough or hemoptysis.  He denied having any fever or chills.  He has no nausea, vomiting, diarrhea or constipation.  He denied having any headache or visual changes.  He is here today for evaluation before starting the last cycle of his consolidation treatment with Imfinzi.   MEDICAL HISTORY: Past Medical History:  Diagnosis Date  . CAD S/P percutaneous coronary angioplasty 2004   which showed essentially normal LV size and function, moderately dilated left atrium, moderate mitral prolapse with mild to moderate regurgitation.  . Dyslipidemia, goal LDL below 70   . Essential hypertension   . GERD (gastroesophageal reflux disease)   . History of Mitral valve prolapse    Moderate - with moderate MR, noted February t 2013  . History of Severe mitral regurgitation by prior echocardiogram 02/06/2014   TEE: Severe mitral regurgitation with a flail P2 segment and  ruptured; Normal LV size & function, dilated LA.  Marland Kitchen History of stress test 12/2009   he walked 9 mins reaching 10 METS. There was an attenuation artifact in the inferior region but no ischemia or infaract, low risk.  . Incidental lung nodule, greater than or equal to 42mm 03/16/2014   Ground glass opacity RML noted on CT scan  . lung ca dx'd 03/2019  . PONV (postoperative nausea and vomiting)   . 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 ST elevation myocardial infarction (STEMI) of anterior wall, subsequent episode of care [facility]) 2004   he had a proximal LAD occlusion treated with 2 overlapping 3.5 x 1.8 mm Cypher DES stents.    ALLERGIES:  is allergic to a-cillin [ampicillin], amoxicillin, other, penicillins, and sulfa antibiotics.  MEDICATIONS:  Current Outpatient Medications  Medication Sig Dispense Refill  . Ascorbic Acid (VITAMIN C) 1000 MG tablet Take 2,000 mg by mouth daily.     . Coenzyme Q10 (CO Q 10) 100 MG CAPS Take 100 mg by mouth daily.     . Hypromellose (HYDROXYPROPYL METHYLCELLULOSE) POWD     . MAGNESIUM PO Take 1 tablet by mouth at bedtime.    . Melatonin 10 MG TABS Take 10 mg by mouth at bedtime.    . rosuvastatin (CRESTOR) 10 MG tablet Take 1 tablet (10 mg total) by mouth daily. 90 tablet 3  .  Specialty Vitamins Products (PROSTATE PO) Take 1 capsule by mouth daily. PROSTATE FORMULA    . vitamin B-12 (CYANOCOBALAMIN) 1000 MCG tablet Take 1,000 mcg by mouth daily.    . Vitamin D-Vitamin K (D3 + K2 DOTS PO) Take 1 tablet by mouth daily.     No current facility-administered medications for this visit.    SURGICAL HISTORY:  Past Surgical History:  Procedure Laterality Date  . ANTERIOR CRUCIATE LIGAMENT  REPAIR Left 1993  . CARDIAC CATHETERIZATION  2004   he had a proximal LAD occlusion treated with 2 overlapping 3.5 x 1.8 mm Cypher DES stents.  Marland Kitchen CARDIAC CATHETERIZATION  2007; 03/2014   Widely patent LAD stents, 40% distal LAD, 30% Cx, ~50% PL-PDA bifurcation lesion   . COLONOSCOPY    . INTRAOPERATIVE TRANSESOPHAGEAL ECHOCARDIOGRAM N/A 04/08/2014   Procedure: INTRAOPERATIVE TRANSESOPHAGEAL ECHOCARDIOGRAM;  Surgeon: Rexene Alberts, MD;  Location: [place];  Service: Open Heart Surgery;  Laterality: N/A;  . LEFT AND RIGHT HEART CATHETERIZATION WITH CORONARY ANGIOGRAM N/A 03/05/2014   Procedure: LEFT AND RIGHT HEART CATHETERIZATION WITH CORONARY ANGIOGRAM;  Surgeon: Leonie Man, MD;  Location: [facility] C[facility] LAB;  Service: Cardiovascular;  Laterality: N/A;  . LEFT HEART CATHETERIZATION WITH CORONARY ANGIOGRAM N/A 09/15/2011   Procedure: LEFT HEART CATHETERIZATION WITH CORONARY ANGIOGRAM;  Surgeon: Lorretta Harp, MD;  Location: [facility] CATH LAB;  Service: Cardiovascular;  Laterality: N/A;  . MITRAL VALVE REPAIR Right 04/08/2014   Procedure: MINIMALLY INVASIVE MITRAL VALVE REPAIR (MVR);  Surgeon: Rexene Alberts, MD;  Location: [place];  Service: Open Heart Surgery;  Laterality: Right;  . NM MYOVIEW LTD  May 2011   Walk 9 minutes, and 10 METs, diaphragmatic attenuation but no ischemia or infarction.  . SVT ABLATION N/A 10/17/2019   Procedure: SVT ABLATION;  Surgeon: Constance Haw, MD;  Location: [place] CV LAB;  Service: Cardiovascular;  Laterality: N/A;  . TEE WITHOUT CARDIOVERSION N/A 02/06/2014   Procedure: TRANSESOPHAGEAL ECHOCARDIOGRAM (TEE);  Surgeon: Pixie Casino, MD;  Normal LV Size U& function - EF 55-60%, no regional WMA.  MV P2 Leaflet is flail with ruptured chord with severe prolapse, anterior leaflet intact.  Severe, eccentric anterior directed MR with dilated LA.  Marland Kitchen TRANSTHORACIC ECHOCARDIOGRAM  07/18/2018   Normal LV size and function.  EF 50-60 %.  Unable to assess diastolic function.   Mitral valve sewing ring in place.  Mild stenosis with a gradient of 6 mmHg noted. -  Domingo Dimes ECHOCARDIOGRAM  June 30 2015   Low normal LV function with EF 50-55%. GR 1 DD. No MR. Normal gradient of 4 mmHg the mitral valve. No prolapse.  Marland Kitchen VIDEO BRONCHOSCOPY WITH ENDOBRONCHIAL ULTRASOUND N/A 05/16/2019   Procedure: VIDEO BRONCHOSCOPY WITH ENDOBRONCHIAL ULTRASOUND;  Surgeon: Marshell Garfinkel, MD;  Location: [place];  Service: Pulmonary;  Laterality: N/A;    REVIEW OF SYSTEMS:  A comprehensive review of systems was negative.   PHYSICAL EXAMINATION: General appearance: alert, cooperative and no distress Head: Normocephalic, without obvious abnormality, atraumatic Neck: no adenopathy, no JVD, supple, symmetrical, trachea midline and thyroid not enlarged, symmetric, no tenderness/mass/nodules Lymph nodes: Cervical, supraclavicular, and axillary nodes normal. Resp: clear to auscultation bilaterally Back: symmetric, no curvature. ROM normal. No CVA tenderness. Cardio: regular rate and rhythm, S1, S2 normal, no murmur, click, rub or gallop GI: soft, non-tender; bowel sounds normal; no masses,  no organomegaly Extremities: extremities normal, atraumatic, no cyanosis or edema  ECOG PERFORMANCE STATUS: 1 - Symptomatic but completely ambulatory  Blood pressure 116/89, pulse 84, temperature (!) 97.4 F (36.3 C), temperature source Tympanic, resp. rate 17, height 6\' 4"  (1.93 m), weight 193 lb 1.6 oz (87.6 kg), SpO2 100 %.  LABORATORY DATA: Lab Results  Component Value Date   WBC 4.9 08/19/2020   HGB 14.7 08/19/2020   HCT 43.3 08/19/2020   MCV 93.1 08/19/2020   PLT 204 08/19/2020      Chemistry      Component Value Date/Time   NA 141 07/14/2020 0746   NA 141 02/25/2020 0915   K 4.0 07/14/2020 0746   CL 108 07/14/2020 0746   CO2 25 07/14/2020 0746   BUN 14 07/14/2020 0746   BUN 14 02/25/2020 0915   CREATININE 0.77 07/14/2020 0746   CREATININE 0.80 02/27/2014 1152      Component  Value Date/Time   CALCIUM 9.7 07/14/2020 0746   ALKPHOS 87 07/14/2020 0746   AST 20 07/14/2020 0746   ALT 18 07/14/2020 0746   BILITOT 1.4 (H) 07/14/2020 0746       RADIOGRAPHIC STUDIES: No results found.  ASSESSMENT AND PLAN: This is a very pleasant 65 years old white male recently diagnosed with a stage IIIb non-small cell lung cancer, adenocarcinoma with no actionable mutation.  He completed a course of concurrent chemoradiation with weekly carboplatin and paclitaxel status post 7 cycles.   His treatment was complicated with dysphagia, odynophagia secondary to radiation induced esophagitis.  He also has persistent cough and shortness of breath secondary to radiation-induced pneumonitis. He has partial response after this treatment. The patient is currently undergoing treatment with consolidation immunotherapy with Imfinzi 1500 mg IV every 4 weeks is status post 12 cycles. The patient continues to tolerate this treatment well with no concerning adverse effects. I recommended for him to proceed with cycle #13 today as planned. I will see him back for follow-up visit in 1 months for evaluation with repeat CT scan of the chest for restaging of his disease after completion of the immunotherapy. The patient was advised to call immediately if he has any concerning symptoms in the interval. The patient voices understanding of current disease status and treatment options and is in agreement with the current care plan. All questions were answered. The patient knows to call the clinic with any problems, questions or concerns. We can certainly see the patient much sooner if necessary.  Disclaimer: This note was dictated with voice recognition software. Similar sounding words can inadvertently be transcribed and may not be corrected upon review.

## 2020-08-19 NOTE — Patient Instructions (Signed)
[facility] Discharge Instructions for Patients Receiving Chemotherapy  Today you received the following chemotherapy agents: Imfinzi.  To help prevent nausea and vomiting after your treatment, we encourage you to take your nausea medication as directed.   If you develop nausea and vomiting that is not controlled by your nausea medication, call the clinic.   BELOW ARE SYMPTOMS THAT SHOULD BE REPORTED IMMEDIATELY:  *FEVER GREATER THAN 100.5 F  *CHILLS WITH OR WITHOUT FEVER  NAUSEA AND VOMITING THAT IS NOT CONTROLLED WITH YOUR NAUSEA MEDICATION  *UNUSUAL SHORTNESS OF BREATH  *UNUSUAL BRUISING OR BLEEDING  TENDERNESS IN MOUTH AND THROAT WITH OR WITHOUT PRESENCE OF ULCERS  *URINARY PROBLEMS  *BOWEL PROBLEMS  UNUSUAL RASH Items with * indicate a potential emergency and should be followed up as soon as possible.  Feel free to call the clinic should you have any questions or concerns. The clinic phone number is [phone].  Please show the CHEMO ALERT CARD at check-in to the Emergency Department and triage nurse.   

## 2020-08-24 ENCOUNTER — Telehealth: Payer: Self-pay | Admitting: Internal Medicine

## 2020-08-24 NOTE — Telephone Encounter (Signed)
Scheduled per 1/6 los. Called and spoke with pt, confirmed added appts

## 2020-09-13 ENCOUNTER — Other Ambulatory Visit: Payer: Self-pay

## 2020-09-13 ENCOUNTER — Inpatient Hospital Stay: Payer: Federal, State, Local not specified - PPO

## 2020-09-13 ENCOUNTER — Ambulatory Visit (HOSPITAL_COMMUNITY)
Admission: RE | Admit: 2020-09-13 | Discharge: 2020-09-13 | Disposition: A | Discharge status: Home / Self Care | Payer: Federal, State, Local not specified - PPO | Source: Ambulatory Visit | Attending: Internal Medicine | Admitting: Internal Medicine

## 2020-09-13 DIAGNOSIS — C3490 Malignant neoplasm of unspecified part of unspecified bronchus or lung: Secondary | ICD-10-CM | POA: Diagnosis not present

## 2020-09-13 DIAGNOSIS — C3431 Malignant neoplasm of lower lobe, right bronchus or lung: Secondary | ICD-10-CM | POA: Diagnosis not present

## 2020-09-13 LAB — CMP (CANCER CENTER ONLY)
ALT: 20 U/L (ref 0–44)
AST: 25 U/L (ref 15–41)
Albumin: 4.5 g/dL (ref 3.5–5.0)
Alkaline Phosphatase: 72 U/L (ref 38–126)
Anion gap: 7 (ref 5–15)
BUN: 16 mg/dL (ref 8–23)
CO2: 27 mmol/L (ref 22–32)
Calcium: 9.8 mg/dL (ref 8.9–10.3)
Chloride: 107 mmol/L (ref 98–111)
Creatinine: 0.83 mg/dL (ref 0.61–1.24)
GFR, Estimated: >60 mL/min (ref >60)
Glucose, Bld: 98 mg/dL (ref 70–99)
Potassium: 4 mmol/L (ref 3.5–5.1)
Sodium: 141 mmol/L (ref 135–145)
Total Bilirubin: 1.1 mg/dL (ref 0.3–1.2)
Total Protein: 7 g/dL (ref 6.5–8.1)

## 2020-09-13 LAB — CBC WITH DIFFERENTIAL (CANCER CENTER ONLY)
Abs Immature Granulocytes: 0.02 10*3/uL (ref 0.00–0.07)
Basophils Absolute: 0 10*3/uL (ref 0.0–0.1)
Basophils Relative: 1 %
Eosinophils Absolute: 0.1 10*3/uL (ref 0.0–0.5)
Eosinophils Relative: 2 %
HCT: 44.2 % (ref 39.0–52.0)
Hemoglobin: 14.6 g/dL (ref 13.0–17.0)
Immature Granulocytes: 0 %
Lymphocytes Relative: 19 %
Lymphs Abs: 1 10*3/uL (ref 0.7–4.0)
MCH: 31.5 pg (ref 26.0–34.0)
MCHC: 33 g/dL (ref 30.0–36.0)
MCV: 95.3 fL (ref 80.0–100.0)
Monocytes Absolute: 0.5 10*3/uL (ref 0.1–1.0)
Monocytes Relative: 9 %
Neutro Abs: 3.7 10*3/uL (ref 1.7–7.7)
Neutrophils Relative %: 69 %
Platelet Count: 191 10*3/uL (ref 150–400)
RBC: 4.64 MIL/uL (ref 4.22–5.81)
RDW: 12.2 % (ref 11.5–15.5)
WBC Count: 5.4 10*3/uL (ref 4.0–10.5)
nRBC: 0 % (ref 0.0–0.2)

## 2020-09-13 MED ORDER — IOHEXOL 300 MG/ML  SOLN
75.0000 mL | Freq: Once | INTRAMUSCULAR | Status: AC | PRN
  Administered 2020-09-13: 75 mL via INTRAVENOUS

## 2020-09-17 ENCOUNTER — Other Ambulatory Visit: Payer: Federal, State, Local not specified - PPO

## 2020-09-20 ENCOUNTER — Other Ambulatory Visit: Payer: Self-pay

## 2020-09-20 ENCOUNTER — Encounter: Payer: Self-pay | Admitting: Internal Medicine

## 2020-09-20 ENCOUNTER — Inpatient Hospital Stay: Payer: Federal, State, Local not specified - PPO | Attending: Internal Medicine | Admitting: Internal Medicine

## 2020-09-20 VITALS — BP 120/93 | HR 100 | Temp 97.1°F | Resp 20 | Ht 76.0 in | Wt 188.8 lb

## 2020-09-20 DIAGNOSIS — Z9221 Personal history of antineoplastic chemotherapy: Secondary | ICD-10-CM | POA: Diagnosis not present

## 2020-09-20 DIAGNOSIS — Z923 Personal history of irradiation: Secondary | ICD-10-CM | POA: Diagnosis not present

## 2020-09-20 DIAGNOSIS — R000 Tachycardia, unspecified: Secondary | ICD-10-CM | POA: Diagnosis not present

## 2020-09-20 DIAGNOSIS — C3491 Malignant neoplasm of unspecified part of right bronchus or lung: Secondary | ICD-10-CM | POA: Diagnosis not present

## 2020-09-20 DIAGNOSIS — I10 Essential (primary) hypertension: Secondary | ICD-10-CM | POA: Diagnosis not present

## 2020-09-20 DIAGNOSIS — C3490 Malignant neoplasm of unspecified part of unspecified bronchus or lung: Secondary | ICD-10-CM

## 2020-09-20 DIAGNOSIS — Z79899 Other long term (current) drug therapy: Secondary | ICD-10-CM | POA: Diagnosis not present

## 2020-09-20 DIAGNOSIS — Z85118 Personal history of other malignant neoplasm of bronchus and lung: Secondary | ICD-10-CM | POA: Diagnosis present

## 2020-09-20 NOTE — Progress Notes (Signed)
[name] [phone]   Fax:(336) [phone]  OFFICE PROGRESS NOTE  [name], L.[name], [place] [address] [place] [address]  DIAGNOSIS: stage IIIb (T2b, N3, M0) non-small cell lung cancer, adenocarcinoma presented with right lower lobe lung nodule in addition to right hilar mass/node as well as ipsilateral and contralateral mediastinal lymphadenopathy diagnosed in October 2020.  Molecular studies by Guardant 360 showed no actionable mutations.  PRIOR THERAPY:  1) Concurrent chemoradiation with weekly carboplatin for AUC of 2 and paclitaxel 45 mg/M2.  Status post 7 cycles.  Last dose was given July 21, 2019. 2) Consolidation treatment with immunotherapy with Imfinzi 1500 mg.  Status post 13 cycles.  Last dose was given August 19, 2020.  CURRENT THERAPY: Observation.  INTERVAL HISTORY: Alan [name] 65 y.o. male returns to the clinic today for follow-up visit.  The patient is feeling fine today with no concerning complaints.  He denied having any current chest pain, shortness of breath, cough or hemoptysis.  He denied having any fever or chills.  He has no nausea, vomiting, diarrhea or constipation.  He has no headache or visual changes.  The patient has no weight loss or night sweats.  He continues to have tachycardia and he is followed by cardiology.  He completed the course of consolidation treatment with Imfinzi fairly well.  He is here today for evaluation and repeat CT scan of the chest for restaging of his disease.   MEDICAL HISTORY: Past Medical History:  Diagnosis Date  . CAD S/P percutaneous coronary angioplasty 2004   which showed essentially normal LV size and function, moderately dilated left atrium, moderate mitral prolapse with mild to moderate regurgitation.  . Dyslipidemia, goal LDL below 70   . Essential hypertension   . GERD (gastroesophageal reflux disease)   . History of Mitral valve prolapse    Moderate - with  moderate MR, noted February t 2013  . History of Severe mitral regurgitation by prior echocardiogram 02/06/2014   TEE: Severe mitral regurgitation with a flail P2 segment and ruptured; Normal LV size & function, dilated LA.  Marland Kitchen History of stress test 12/2009   he walked 9 mins reaching 10 METS. There was an attenuation artifact in the inferior region but no ischemia or infaract, low risk.  . Incidental lung nodule, greater than or equal to 60mm 03/16/2014   Ground glass opacity RML noted on CT scan  . lung ca dx'd 03/2019  . PONV (postoperative nausea and vomiting)   . 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 ST elevation myocardial infarction (STEMI) of anterior wall, subsequent episode of care [facility]) 2004   he had a proximal LAD occlusion treated with 2 overlapping 3.5 x 1.8 mm Cypher DES stents.    ALLERGIES:  is allergic to a-cillin [ampicillin], amoxicillin, other, penicillins, and sulfa antibiotics.  MEDICATIONS:  Current Outpatient Medications  Medication Sig Dispense Refill  . Ascorbic Acid (VITAMIN C) 1000 MG tablet Take 2,000 mg by mouth daily.     . Coenzyme Q10 (CO Q 10) 100 MG CAPS Take 100 mg by mouth daily.     . Hypromellose (HYDROXYPROPYL METHYLCELLULOSE) POWD     . MAGNESIUM PO Take 1 tablet by mouth at bedtime.    Marland Kitchen  Melatonin 10 MG TABS Take 10 mg by mouth at bedtime.    . rosuvastatin (CRESTOR) 10 MG tablet Take 1 tablet (10 mg total) by mouth daily. 90 tablet 3  . Specialty Vitamins Products (PROSTATE PO) Take 1 capsule by mouth daily. PROSTATE FORMULA    . vitamin B-12 (CYANOCOBALAMIN) 1000 MCG tablet Take 1,000 mcg by mouth daily.    . Vitamin D-Vitamin K (D3 + K2 DOTS PO) Take 1 tablet by mouth  daily.     No current facility-administered medications for this visit.    SURGICAL HISTORY:  Past Surgical History:  Procedure Laterality Date  . ANTERIOR CRUCIATE LIGAMENT REPAIR Left 1993  . CARDIAC CATHETERIZATION  2004   he had a proximal LAD occlusion treated with 2 overlapping 3.5 x 1.8 mm Cypher DES stents.  Marland Kitchen CARDIAC CATHETERIZATION  2007; 03/2014   Widely patent LAD stents, 40% distal LAD, 30% Cx, ~50% PL-PDA bifurcation lesion   . COLONOSCOPY    . INTRAOPERATIVE TRANSESOPHAGEAL ECHOCARDIOGRAM N/A 04/08/2014   Procedure: INTRAOPERATIVE TRANSESOPHAGEAL ECHOCARDIOGRAM;  Surgeon: Rexene Alberts, MD;  Location: [place];  Service: Open Heart Surgery;  Laterality: N/A;  . LEFT AND RIGHT HEART CATHETERIZATION WITH CORONARY ANGIOGRAM N/A 03/05/2014   Procedure: LEFT AND RIGHT HEART CATHETERIZATION WITH CORONARY ANGIOGRAM;  Surgeon: Leonie Man, MD;  Location: [facility] CATH LAB;  Service: Cardiovascular;  Laterality: N/A;  . LEFT HEART CATHETERIZATION WITH CORONARY ANGIOGRAM N/A 09/15/2011   Procedure: LEFT HEART CATHETERIZATION WITH CORONARY ANGIOGRAM;  Surgeon: Lorretta Harp, MD;  Location: [facility] CATH LAB;  Service: Cardiovascular;  Laterality: N/A;  . MITRAL VALVE REPAIR Right 04/08/2014   Procedure: MINIMALLY INVASIVE MITRAL VALVE REPAIR (MVR);  Surgeon: Rexene Alberts, MD;  Location: [place];  Service: Open Heart Surgery;  Laterality: Right;  . NM MYOVIEW LTD  May 2011   Walk 9 minutes, and 10 METs, diaphragmatic attenuation but no ischemia or infarction.  . SVT ABLATION N/A 10/17/2019   Procedure: SVT ABLATION;  Surgeon: Constance Haw, MD;  Location: [place] CV LAB;  Service: Cardiovascular;  Laterality: N/A;  . TEE WITHOUT CARDIOVERSION N/A 02/06/2014   Procedure: TRANSESOPHAGEAL ECHOCARDIOGRAM (TEE);  Surgeon: Pixie Casino, MD;  Normal LV Size U& function - EF 55-60%, no regional WMA.  MV P2 Leaflet is flail with ruptured chord with severe prolapse, anterior leaflet intact.   Severe, eccentric anterior directed MR with dilated LA.  Marland Kitchen TRANSTHORACIC ECHOCARDIOGRAM  07/18/2018   Normal LV size and function.  EF 50-60 %.  Unable to assess diastolic function.  Mitral valve sewing ring in place.  Mild stenosis with a gradient of 6 mmHg noted. -  Domingo Dimes ECHOCARDIOGRAM  June 30 2015   Low normal LV function with EF 50-55%. GR 1 DD. No MR. Normal gradient of 4 mmHg the mitral valve. No prolapse.  Marland Kitchen VIDEO BRONCHOSCOPY WITH ENDOBRONCHIAL ULTRASOUND N/A 05/16/2019   Procedure: VIDEO BRONCHOSCOPY WITH ENDOBRONCHIAL ULTRASOUND;  Surgeon: Marshell Garfinkel, MD;  Location: [place];  Service: Pulmonary;  Laterality: N/A;    REVIEW OF SYSTEMS:  Constitutional: negative Eyes: negative Ears, nose, mouth, throat, and face: negative Respiratory: negative Cardiovascular: positive for Tachycardia Gastrointestinal: negative Genitourinary:negative Integument/breast: negative Hematologic/lymphatic: negative Musculoskeletal:negative Neurological: negative Behavioral/Psych: negative Endocrine: negative Allergic/Immunologic: negative   PHYSICAL EXAMINATION: General appearance: alert, cooperative and no distress Head: Normocephalic, without obvious abnormality, atraumatic Neck: no adenopathy, no JVD, supple, symmetrical, trachea midline and thyroid not enlarged, symmetric, no tenderness/mass/nodules Lymph nodes: Cervical, supraclavicular, and axillary  nodes normal. Resp: clear to auscultation bilaterally Back: symmetric, no curvature. ROM normal. No CVA tenderness. Cardio: regular rate and rhythm, S1, S2 normal, no murmur, click, rub or gallop GI: soft, non-tender; bowel sounds normal; no masses,  no organomegaly Extremities: extremities normal, atraumatic, no cyanosis or edema Neurologic: Alert and oriented X 3, normal strength and tone. Normal symmetric reflexes. Normal coordination and gait  ECOG PERFORMANCE STATUS: 1 - Symptomatic but completely ambulatory  Blood  pressure (!) 120/93, pulse 100, temperature (!) 97.1 F (36.2 C), temperature source Tympanic, resp. rate 20, height 6\' 4"  (1.93 m), weight 188 lb 12.8 oz (85.6 kg), SpO2 100 %.  LABORATORY DATA: Lab Results  Component Value Date   WBC 5.4 09/13/2020   HGB 14.6 09/13/2020   HCT 44.2 09/13/2020   MCV 95.3 09/13/2020   PLT 191 09/13/2020      Chemistry      Component Value Date/Time   NA 141 09/13/2020 1439   NA 141 02/25/2020 0915   K 4.0 09/13/2020 1439   CL 107 09/13/2020 1439   CO2 27 09/13/2020 1439   BUN 16 09/13/2020 1439   BUN 14 02/25/2020 0915   CREATININE 0.83 09/13/2020 1439   CREATININE 0.80 02/27/2014 1152      Component Value Date/Time   CALCIUM 9.8 09/13/2020 1439   ALKPHOS 72 09/13/2020 1439   AST 25 09/13/2020 1439   ALT 20 09/13/2020 1439   BILITOT 1.1 09/13/2020 1439       RADIOGRAPHIC STUDIES: CT Chest W Contrast  Result Date: 09/13/2020 CLINICAL DATA:  Non-small cell lung cancer staging evaluation. EXAM: CT CHEST WITH CONTRAST TECHNIQUE: Multidetector CT imaging of the chest was performed during intravenous contrast administration. CONTRAST:  67mL OMNIPAQUE IOHEXOL 300 MG/ML  SOLN COMPARISON:  May 10, 2020. FINDINGS: Cardiovascular: Calcified atheromatous plaque and noncalcified plaque in the thoracic aorta with stable mild dilation of the thoracic aorta unchanged from previous imaging. Heart size is stable. Signs of coronary artery disease. Signs of atrial septal closure device. Central pulmonary vasculature unremarkable on venous phase assessment aside from distortion of RIGHT hilum in the setting of post treatment/radiotherapy. Mediastinum/Nodes: Normal appearance of thoracic inlet structures. No mediastinal, axillary or hilar adenopathy. Post treatment changes about the RIGHT hilum as before. Lungs/Pleura: Perihilar scarring radiating to the peripheral RIGHT lung with associated bronchiectatic changes in the consolidation without change. Central  airways are patent. Normal appearance of airways in the LEFT chest. Upper Abdomen: Incidental imaging of upper abdominal contents showing no acute process. Adrenal glands are normal. Musculoskeletal: Spinal degenerative changes. No acute or destructive bone process. IMPRESSION: Post treatment changes in the RIGHT chest without interval change. No new or suspicious findings. Area of concern, ground-glass in the posterior LEFT upper lobe is no longer seen. Aortic atherosclerosis. Coronary artery disease. Aortic Atherosclerosis (ICD10-I70.0). Electronically Signed   By: Zetta Bills M.D.   On: 09/13/2020 16:32    ASSESSMENT AND PLAN: This is a very pleasant 65 years old white male recently diagnosed with a stage IIIb non-small cell lung cancer, adenocarcinoma with no actionable mutation.  He completed a course of concurrent chemoradiation with weekly carboplatin and paclitaxel status post 7 cycles.   His treatment was complicated with dysphagia, odynophagia secondary to radiation induced esophagitis.  He also has persistent cough and shortness of breath secondary to radiation-induced pneumonitis. He has partial response after this treatment. The patient completed treatment with consolidation immunotherapy with Imfinzi 1500 mg IV every 4 weeks, status post 13 cycles.  Last dose was in January 2022. The patient tolerated this treatment well with no concerning adverse effects. He had repeat CT scan of the chest performed recently.  I personally and independently reviewed the scans and discussed the results with the patient today. His scan showed no concerning findings for disease recurrence or metastasis. I discussed the scan results with the patient today.  His scan showed no concerning findings for disease recurrence or metastasis. I recommended for the patient to continue on observation with repeat CT scan of the chest in 3 months. For the tachycardia he will continue his routine follow-up visit and  evaluation by cardiology. The patient was advised to call immediately if he has any other concerning symptoms in the interval. The patient voices understanding of current disease status and treatment options and is in agreement with the current care plan. All questions were answered. The patient knows to call the clinic with any problems, questions or concerns. We can certainly see the patient much sooner if necessary.  Disclaimer: This note was dictated with voice recognition software. Similar sounding words can inadvertently be transcribed and may not be corrected upon review.

## 2020-09-21 ENCOUNTER — Telehealth: Payer: Self-pay | Admitting: Internal Medicine

## 2020-09-21 NOTE — Telephone Encounter (Signed)
Scheduled apt per 2/7 LOS  - mailed letter with appt appt date and time

## 2020-10-06 ENCOUNTER — Ambulatory Visit: Payer: Federal, State, Local not specified - PPO | Admitting: Cardiology

## 2020-10-06 ENCOUNTER — Other Ambulatory Visit: Payer: Self-pay

## 2020-10-06 VITALS — BP 100/70 | HR 78 | Ht 76.0 in | Wt 191.0 lb

## 2020-10-06 DIAGNOSIS — Z9861 Coronary angioplasty status: Secondary | ICD-10-CM

## 2020-10-06 DIAGNOSIS — Z9889 Other specified postprocedural states: Secondary | ICD-10-CM

## 2020-10-06 DIAGNOSIS — I471 Supraventricular tachycardia: Secondary | ICD-10-CM

## 2020-10-06 DIAGNOSIS — I10 Essential (primary) hypertension: Secondary | ICD-10-CM | POA: Diagnosis not present

## 2020-10-06 DIAGNOSIS — I2510 Atherosclerotic heart disease of native coronary artery without angina pectoris: Secondary | ICD-10-CM

## 2020-10-06 DIAGNOSIS — I341 Nonrheumatic mitral (valve) prolapse: Secondary | ICD-10-CM | POA: Diagnosis not present

## 2020-10-06 DIAGNOSIS — E785 Hyperlipidemia, unspecified: Secondary | ICD-10-CM | POA: Diagnosis not present

## 2020-10-06 DIAGNOSIS — I340 Nonrheumatic mitral (valve) insufficiency: Secondary | ICD-10-CM

## 2020-10-06 MED ORDER — ATORVASTATIN CALCIUM 10 MG PO TABS: 10.0000 mg | ORAL_TABLET | Freq: Every day | ORAL | 11 refills | Status: DC

## 2020-10-06 NOTE — Patient Instructions (Addendum)
Medication Instructions:   start Atorvastatin 10 mg  Daily   build up to daily  start with 1 to 2 times a week  Then gradually increase each 2 to 3 weeks   *If you need a refill on your cardiac medications before your next appointment, please call your pharmacy*   Lab Work: Lipids- fasting  If you have labs (blood work) drawn today and your tests are completely normal, you will receive your results only by: Marland Kitchen MyChart Message (if you have MyChart) OR . A paper copy in the mail If you have any lab test that is abnormal or we need to change your treatment, we will call you to review the results.   Testing/Procedures: Will be schedule in Jan or Feb 2023 at [place] has requested that you have an echocardiogram. Echocardiography is a painless test that uses sound waves to create images of your heart. It provides your doctor with information about the size and shape of your heart and how well your heart's chambers and valves are working. This procedure takes approximately one hour. There are no restrictions for this procedure.     Follow-Up: At [facility], you and your health needs are our priority.  As part of our continuing mission to provide you with exceptional heart care, we have created designated Provider Care Teams.  These Care Teams include your primary Cardiologist (physician) and Advanced Practice Providers (APPs -  Physician Assistants and Nurse Practitioners) who all work together to provide you with the care you need, when you need it.     Your next appointment:   12 month(s)  The format for your next appointment:   In Person  Provider:   Glenetta Hew, MD   Other Instructions You have been referred to [facility] in 4 to 6 weeks - lipid

## 2020-10-06 NOTE — Progress Notes (Signed)
Primary Care Provider: [name], L.[name], MD Cardiologist: Glenetta Hew, MD Electrophysiologist: None  Clinic Note: Chief Complaint  Patient presents with  . Follow-up    Did not tolerate rosuvastatin, asked about atorvastatin  . Coronary Artery Disease    No angina symptoms.  Reactive.   ===================================  ASSESSMENT/PLAN   Problem List Items Addressed This Visit    CAD S/P percutaneous coronary angioplasty - DES PCI to occluded LAD during anterior STEMI - Primary (Chronic)    Distant history of MI of LAD PCI.  Stents troponin follow-up cath in 2013 and 2015. No further anginal symptoms.  He is very reluctant to take medications and blood pressure would not tolerate either beta-blocker or ARB.  Plan: Continue aspirin alone although he is actually taking NSAIDs at home. We will give 1 more attempt at treating his lipids with atorvastatin 10 mg and stop the rosuvastatin. => Refer to [facility] lipid clinic.      Relevant Medications   atorvastatin (LIPITOR) 10 MG tablet   Other Relevant Orders   EKG 12-Lead (Completed)   ECHOCARDIOGRAM COMPLETE   History of Severe Mitral valve prolapse - severe prolapse of the posterior (P2) leaflet with severe MR (Chronic)    Mitral repair by Dr. Roxy Manns.  Barely audible on exam.  Due for follow-up echocardiogram in 2023, prior to his annual follow-up.      Relevant Medications   atorvastatin (LIPITOR) 10 MG tablet   Other Relevant Orders   EKG 12-Lead (Completed)   ECHOCARDIOGRAM COMPLETE   S/P minimally invasive mitral valve repair (Chronic)    Somewhat complex preprocedure.  Valve seems to be working well on follow-up echocardiogram.  Being followed by this office.      RESOLVED: History of Severe mitral regurgitation (Chronic)    Associated with mitral prolapse.  Now status post mitral valve repair.  Doing well.  Due for follow-up echocardiogram in 2023.      Relevant Medications   atorvastatin (LIPITOR) 10 MG  tablet   Essential hypertension (Chronic)    I do not think he truly has hypertension.      Relevant Medications   atorvastatin (LIPITOR) 10 MG tablet   Dyslipidemia, goal LDL below 70 (Chronic)    Did not tolerate Crestor.  Apparently past he had done okay with atorvastatin.  We will retry atorvastatin and have him follow-up with [facility] Lipid [facility].      Relevant Medications   atorvastatin (LIPITOR) 10 MG tablet   Other Relevant Orders   EKG 12-Lead (Completed)   Lipid panel (Completed)   AMB Referral to [facility] Pharm-D   SVT (supraventricular tachycardia) (HCC) (Chronic)    No further breakthrough episodes.  He is not on a beta-blocker He really has not had any symptoms of rhythm issues since his SVT ablation.      Relevant Medications   atorvastatin (LIPITOR) 10 MG tablet      ===================================  HPI:    Alan [name] is a 65 y.o. 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   2004: proximal LAD occlusion treated with 2 overlapping 3.5 x 1.8 mm Cypher DES stents.  Reluctant to take medications, especially statins.  04/08/2014: MINIMALLY INVASIVE MITRAL VALVE REPAIR (MVR);  Surgeon: Rexene Alberts, MD;    Preop cath: Widely patent LAD stents.  40% distal LAD, 30% LCx, ~50% bifurcation PL- PDA  10/17/2019-: Comprehensive EPS involving coronary sinus pacing, mapping of SVT and RFA ablation of SVT -> with no inducible arrhythmia following ablation  August 2020: Diagnosis of lung adenocarcinoma  Alan [name] was last seen on March 29, 2020 -> he was finally started to feel better after his lung cancer therapy.  Was gaining some of his strength and weight back.  Was doing in spin class III times a week and playing golf 4 days a week.  Energy level started improved.  Unfortunately he got into eating comfort foods for  snacks, which led to some of the weight gain back.  He had lost 20 pounds and now gained 10 back.  Overall felt he was 75% back. --> We tried to restart beta-blocker, but he was not able to tolerate it.  He did mention  hat he would be interested in treating his lipids. => Started 10 mg Crestor.  Recent Hospitalizations: None  Reviewed  CV studies:    The following studies were reviewed today: (if available, images/films reviewed: From Epic Chart or Care Everywhere) . None:   Interval History:   HAVARD RADIGAN returns here today for follow-up well.  He said that he is pretty happy that he is finished his chemotherapy and radiation therapy for his lung cancer, but he is now doing immunotherapy.  That makes it feel somewhat tired.  He still 90 and drink as well as he could.  He did not tolerate taking Crestor at all.  About 3-4 doses he just could not think lots of different symptoms including myalgias arthralgias etc.  He is now try to do more exercise and he says that about 90%.  His energy level is getting better he is enjoying the spin class, he is not yet doing as much other exercise.  As such, he is little bit out of shape realizes that his heart rate at rest is higher than it usually is.  He also been having issues with orthostatic dizziness.  A lot of times is because he crosses his legs when he sits.  He really is not having any cardiac symptoms to speak of other than energy level improving, and some orthostatic dizziness..  CV Review of Symptoms (Summary) Cardiovascular ROS: no chest pain or dyspnea on exertion positive for - Mild orthostatic dizziness negative for - edema, irregular heartbeat, orthopnea, paroxysmal nocturnal dyspnea, rapid heart rate, shortness of breath or Lightheadedness or dizziness, syncope/near syncope or TIA/amaurosis fugax, claudication  The patient does not have symptoms concerning for COVID-19 infection (fever, chills, cough, or new shortness of breath).    REVIEWED OF SYSTEMS   Review of Systems  Constitutional: Positive for malaise/fatigue (Slowly getting back to about 90%). Negative for weight loss.  HENT: Negative for congestion and nosebleeds.   Respiratory: Positive for cough (Still off on). Negative for shortness of breath.   Gastrointestinal: Negative for blood in stool and melena.  Genitourinary: Negative for hematuria.  Musculoskeletal: Positive for joint pain (Normal MSK pains) and myalgias (When he tried to take Crestor).  Neurological: Negative for dizziness (Only orthostatic), focal weakness and weakness.  Psychiatric/Behavioral: Negative for memory loss. The patient is not nervous/anxious and does not have insomnia.    I have reviewed and (if needed) personally updated the patient's problem list, medications, allergies, past medical and surgical history, social and family history.   PAST MEDICAL HISTORY   Past Medical History:  Diagnosis Date  . Adenocarcinoma of right lung, stage 3 ([place]) dx'd 03/2019  .  CAD S/P percutaneous coronary angioplasty 2004   [facility] STEMI) - Prox LAD 100% => 2 overlapping 3.5 x 1.8 mm Cypher DES stents.;  Patent as of August 2015  . Dyslipidemia, goal LDL below 70   . Essential hypertension   . GERD (gastroesophageal reflux disease)   . History of Mitral valve prolapse    Moderate - with moderate MR, noted February t 2013  . History of Severe mitral regurgitation by prior echocardiogram 02/06/2014   TEE: Severe mitral regurgitation with a flail P2 segment and ruptured; Normal LV size & function, dilated LA.  . Incidental lung nodule, greater than or equal to 2mm 03/16/2014   Ground glass opacity RML noted on CT scan  . PONV (postoperative nausea and vomiting)   . S/P Minimally Invasive MVR (mitral valve repair) 04/08/2014   Complex valvuloplasty including quadrangular resection of flail segment of posterior leaflet, sliding leaflet plasty, chordal transfer x1, Gore-tex neocord placement x4 and 34 mm  Sorin Memo 3D rechord ring annuloplasty via right mini thoracotomy approach  . ST elevation myocardial infarction (STEMI) of anterior wall, subsequent episode of care [facility]) 2004   he had a proximal LAD occlusion treated with 2 overlapping 3.5 x 1.8 mm Cypher DES stents.    PAST SURGICAL HISTORY   Past Surgical History:  Procedure Laterality Date  . ANTERIOR CRUCIATE LIGAMENT REPAIR Left 1993  . COLONOSCOPY    . INTRAOPERATIVE TRANSESOPHAGEAL ECHOCARDIOGRAM N/A 04/08/2014   Procedure: INTRAOPERATIVE TRANSESOPHAGEAL ECHOCARDIOGRAM;  Surgeon: Rexene Alberts, MD;  Location: [place];  Service: Open Heart Surgery;  Laterality: N/A;  . LEFT AND RIGHT HEART CATHETERIZATION WITH CORONARY ANGIOGRAM N/A 03/05/2014   Procedure: LEFT AND RIGHT HEART CATHETERIZATION WITH CORONARY ANGIOGRAM;  Surgeon: Leonie Man, MD;  [facility] LAB;  Service: Cardiovascular; (pre-op) Widely patent LAD stents, 40% distal LAD, 30% Cx, ~50% PL-PDA bifurcation lesion   . LEFT HEART CATH AND CORONARY ANGIOGRAPHY  2004   In setting of anterior STEMI, found to have 100% % proximal LAD (2 DES Cypher stents)  . LEFT HEART CATH AND CORONARY ANGIOGRAPHY  2007   Widely patent LAD stents, 40% distal LAD, 30% Cx, ~50% PL-PDA bifurcation lesion   . LEFT HEART CATHETERIZATION WITH CORONARY ANGIOGRAM N/A 09/15/2011   Procedure: LEFT HEART CATHETERIZATION WITH CORONARY ANGIOGRAM;  Surgeon: Lorretta Harp, MD;  Location: [facility] CATH LAB;  Service: Cardiovascular;  Laterality: N/A;  . MITRAL VALVE REPAIR Right 04/08/2014   Procedure: MINIMALLY INVASIVE MITRAL VALVE REPAIR (MVR);  Surgeon: Rexene Alberts, MD;  Location: [place];  Service: Open Heart Surgery;  Laterality: Right;  . NM MYOVIEW LTD  May 2011   Walk 9 minutes, and 10 METs, diaphragmatic attenuation but no ischemia or infarction.  . SVT ABLATION N/A 10/17/2019   Procedure: SVT ABLATION;  Surgeon: Constance Haw, MD;  Location: [place] CV LAB;  Service: Cardiovascular;   Laterality: N/A;  . TEE WITHOUT CARDIOVERSION N/A 02/06/2014   Procedure: TRANSESOPHAGEAL ECHOCARDIOGRAM (TEE);  Surgeon: Pixie Casino, MD;  Normal LV Size U& function - EF 55-60%, no regional WMA.  MV P2 Leaflet is flail with ruptured chord with severe prolapse, anterior leaflet intact.  Severe, eccentric anterior directed MR with dilated LA.  Marland Kitchen TRANSTHORACIC ECHOCARDIOGRAM  07/18/2018   Normal LV size and function.  EF 50-60 %.  Unable to assess diastolic function.  Mitral valve sewing ring in place.  Mild stenosis with a gradient of 6 mmHg noted. -  . TRANSTHORACIC  ECHOCARDIOGRAM  06/30/2015   Low normal LV function with EF 50-55%. GR 1 DD. No MR. Normal gradient of 4 mmHg the mitral valve. No prolapse.  Marland Kitchen VIDEO BRONCHOSCOPY WITH ENDOBRONCHIAL ULTRASOUND N/A 05/16/2019   Procedure: VIDEO BRONCHOSCOPY WITH ENDOBRONCHIAL ULTRASOUND;  Surgeon: Marshell Garfinkel, MD;  Location: [place];  Service: Pulmonary;  Laterality: N/A;    Immunization History  Administered Date(s) Administered  . Td 05/23/2018  . Tdap 01/14/2007    MEDICATIONS/ALLERGIES   Current Meds  Medication Sig  . Ascorbic Acid (VITAMIN C) 1000 MG tablet Take 2,000 mg by mouth daily.   Marland Kitchen atorvastatin (LIPITOR) 10 MG tablet Take 1 tablet (10 mg total) by mouth daily.  . Coenzyme Q10 (CO Q 10) 100 MG CAPS Take 100 mg by mouth daily.   Hunt Oris IV See admin instructions.  . Hypromellose (HYDROXYPROPYL METHYLCELLULOSE) POWD   . magnesium 30 MG tablet Take 1 tablet by mouth at bedtime.  . Melatonin 10 MG TABS Take 10 mg by mouth at bedtime.  Marland Kitchen Specialty Vitamins Products (PROSTATE PO) Take 1 capsule by mouth daily. PROSTATE FORMULA  . vitamin B-12 (CYANOCOBALAMIN) 1000 MCG tablet Take 1,000 mcg by mouth daily.  . Vitamin D-Vitamin K (D3 + K2 DOTS PO) Take 1 tablet by mouth daily.    Allergies  Allergen Reactions  . A-Cillin [Ampicillin] Shortness Of Breath, Swelling and Rash    Did it involve swelling of the  face/tongue/throat, SOB, or low BP? Yes Did it involve sudden or severe rash/hives, skin peeling, or any reaction on the inside of your mouth or nose? Yes Did you need to seek medical attention at a hospital or doctor's office? Yes When did it last happen?~20 years ago If all above answers are "NO", may proceed with cephalosporin use.    Marland Kitchen Amoxicillin Hives, Shortness Of Breath and Swelling    Did it involve swelling of the face/tongue/throat, SOB, or low BP? Yes Did it involve sudden or severe rash/hives, skin peeling, or any reaction on the inside of your mouth or nose? Yes Did you need to seek medical attention at a hospital or doctor's office? Yes When did it last happen?~20 years ago If all above answers are "NO", may proceed with cephalosporin use.   . Other Hives, Shortness Of Breath and Swelling    ALL "CILLINS"  . Penicillins Hives, Shortness Of Breath and Swelling    Did it involve swelling of the face/tongue/throat, SOB, or low BP? Yes Did it involve sudden or severe rash/hives, skin peeling, or any reaction on the inside of your mouth or nose? Yes Did you need to seek medical attention at a hospital or doctor's office? Yes When did it last happen?~20 years ago If all above answers are "NO", may proceed with cephalosporin use.    . Sulfa Antibiotics Nausea Only  . Crestor [Rosuvastatin] Other (See Comments)    G I upset    SOCIAL HISTORY/FAMILY HISTORY   Reviewed in Epic:  Pertinent findings:  Social History   Tobacco Use  . Smoking status: Never Smoker  . Smokeless tobacco: Never Used  Vaping Use  . Vaping Use: Never used  Substance Use Topics  . Alcohol use: Yes    Alcohol/week: 10.0 standard drinks    Types: 5 Cans of beer, 5 Shots of liquor per week    Comment: social  . Drug use: No   Social History   Social History Narrative   He is a married father of one. Exercises  avidly as noted above - runs routinely at least 3 miles 3-4 times a day.  He  drinks his Xango fruit juce - 32 Oz. Daily.     Never smoked and only takes occasional alcohol    OBJCTIVE -PE, EKG, labs   Wt Readings from Last 3 Encounters:  10/06/20 191 lb (86.6 kg)  09/20/20 188 lb 12.8 oz (85.6 kg)  08/19/20 193 lb 1.6 oz (87.6 kg)    Physical Exam: BP 100/70   Pulse 78   Ht 6\' 4"  (1.93 m)   Wt 191 lb (86.6 kg)   SpO2 98%   BMI 23.25 kg/m  Physical Exam Constitutional:      Appearance: Normal appearance.  HENT:     Head: Normocephalic and atraumatic.  Cardiovascular:     Rate and Rhythm: Normal rate and regular rhythm.     Pulses: Normal pulses.     Heart sounds: Normal heart sounds. No murmur (Soft HSM murmur at base) heard. No friction rub. No gallop.   Pulmonary:     Effort: Pulmonary effort is normal. No respiratory distress.     Breath sounds: Normal breath sounds.  Musculoskeletal:        General: No swelling or deformity. Normal range of motion.     Cervical back: Normal range of motion.  Neurological:     General: No focal deficit present.     Mental Status: He is alert.  Psychiatric:        Mood and Affect: Mood normal.        Behavior: Behavior normal.        Thought Content: Thought content normal.        Judgment: Judgment normal.      Adult ECG Report  Rate: 78 ;  Rhythm: normal sinus rhythm and Left atrial abnormality; normal axis, normal durations.  Narrative Interpretation: Stable EKG.  Recent Labs: Reviewed  02/24/2021: TC 221, TG 98, HDL 49 LDL 154.  09/13/2018: Cr 0.83, K+ 4.0. CBC Latest Ref Rng & Units 09/13/2020 08/19/2020 07/14/2020  WBC 4.0 - 10.5 K/uL 5.4 4.9 4.8  Hemoglobin 13.0 - 17.0 g/dL 14.6 14.7 14.8  Hematocrit 39.0 - 52.0 % 44.2 43.3 43.5  Platelets 150 - 400 K/uL 191 204 187    Lab Results  Component Value Date   TSH 1.928 08/19/2020    ==================================================  COVID-19 Education: The signs and symptoms of COVID-19 were discussed with the patient and how to seek care  for testing (follow up with PCP or arrange E-visit).   The importance of social distancing and COVID-19 vaccination was discussed today. The patient is practicing social distancing & Masking.   I spent a total of 32minutes with the patient spent in direct patient consultation.  Additional time spent with chart review  / charting (studies, outside notes, etc): 14 min Total Time: 35 min   Current medicines are reviewed at length with the patient today.  (+/- concerns) n/a  This visit occurred during the SARS-CoV-2 public health emergency.  Safety protocols were in place, including screening questions prior to the visit, additional usage of staff PPE, and extensive cleaning of exam room while observing appropriate contact time as indicated for disinfecting solutions.  Notice: This dictation was prepared with Dragon dictation along with smaller phrase technology. Any transcriptional errors that result from this process are unintentional and may not be corrected upon review.  Patient Instructions / Medication Changes & Studies & Tests Ordered   Patient Instructions  Medication Instructions:  start Atorvastatin 10 mg  Daily   build up to daily  start with 1 to 2 times a week  Then gradually increase each 2 to 3 weeks   *If you need a refill on your cardiac medications before your next appointment, please call your pharmacy*   Lab Work: Lipids- fasting  If you have labs (blood work) drawn today and your tests are completely normal, you will receive your results only by: Marland Kitchen MyChart Message (if you have MyChart) OR . A paper copy in the mail If you have any lab test that is abnormal or we need to change your treatment, we will call you to review the results.   Testing/Procedures: Will be schedule in Jan or Feb 2023 at [place] has requested that you have an echocardiogram. Echocardiography is a painless test that uses sound waves to create images of  your heart. It provides your doctor with information about the size and shape of your heart and how well your heart's chambers and valves are working. This procedure takes approximately one hour. There are no restrictions for this procedure.     Follow-Up: At [facility], you and your health needs are our priority.  As part of our continuing mission to provide you with exceptional heart care, we have created designated Provider Care Teams.  These Care Teams include your primary Cardiologist (physician) and Advanced Practice Providers (APPs -  Physician Assistants and Nurse Practitioners) who all work together to provide you with the care you need, when you need it.     Your next appointment:   12 month(s)  The format for your next appointment:   In Person  Provider:   Glenetta Hew, MD   Other Instructions You have been referred to [facility] in 4 to 6 weeks - lipid    Studies Ordered:   Orders Placed This Encounter  Procedures  . Lipid panel  . AMB Referral to [facility] Pharm-D  . EKG 12-Lead  . ECHOCARDIOGRAM COMPLETE   Glenetta Hew, MD   ADDENDUM:   Lab Results  Component Value Date   CHOL 185 10/22/2020   HDL 48 10/22/2020   LDLCALC 121 (H) 10/22/2020   TRIG 89 10/22/2020   CHOLHDL 3.9 10/22/2020   Lab Results  Component Value Date   CREATININE 0.83 09/13/2020   BUN 16 09/13/2020   NA 141 09/13/2020   K 4.0 09/13/2020   CL 107 09/13/2020   CO2 27 09/13/2020    Glenetta Hew, M.D., M.S. Interventional Cardiologist   Pager # [phone] Phone # [phone] [address] [place], [place] [address]   Thank you for choosing Heartcare at [facility]!!

## 2020-10-22 LAB — LIPID PANEL
Chol/HDL Ratio: 3.9 ratio (ref 0.0–5.0)
Cholesterol, Total: 185 mg/dL (ref 100–199)
HDL: 48 mg/dL (ref >39)
LDL Chol Calc (NIH): 121 mg/dL — ABNORMAL HIGH (ref 0–99)
Triglycerides: 89 mg/dL (ref 0–149)
VLDL Cholesterol Cal: 16 mg/dL (ref 5–40)

## 2020-10-31 ENCOUNTER — Encounter: Payer: Self-pay | Admitting: Cardiology

## 2020-10-31 NOTE — Assessment & Plan Note (Signed)
Mitral repair by Dr. Roxy Manns.  Barely audible on exam.  Due for follow-up echocardiogram in 2023, prior to his annual follow-up.

## 2020-10-31 NOTE — Assessment & Plan Note (Signed)
Somewhat complex preprocedure.  Valve seems to be working well on follow-up echocardiogram.  Being followed by this office.

## 2020-10-31 NOTE — Assessment & Plan Note (Signed)
Associated with mitral prolapse.  Now status post mitral valve repair.  Doing well.  Due for follow-up echocardiogram in 2023.

## 2020-10-31 NOTE — Assessment & Plan Note (Signed)
Distant history of MI of LAD PCI.  Stents troponin follow-up cath in 2013 and 2015. No further anginal symptoms.  He is very reluctant to take medications and blood pressure would not tolerate either beta-blocker or ARB.  Plan: Continue aspirin alone although he is actually taking NSAIDs at home. We will give 1 more attempt at treating his lipids with atorvastatin 10 mg and stop the rosuvastatin. => Refer to [facility] lipid clinic.

## 2020-10-31 NOTE — Assessment & Plan Note (Signed)
I do not think he truly has hypertension.

## 2020-10-31 NOTE — Assessment & Plan Note (Signed)
No further breakthrough episodes.  He is not on a beta-blocker He really has not had any symptoms of rhythm issues since his SVT ablation.

## 2020-10-31 NOTE — Assessment & Plan Note (Signed)
Did not tolerate Crestor.  Apparently past he had done okay with atorvastatin.  We will retry atorvastatin and have him follow-up with [facility] Lipid Clinic.

## 2020-11-04 ENCOUNTER — Ambulatory Visit (INDEPENDENT_AMBULATORY_CARE_PROVIDER_SITE_OTHER): Payer: Federal, State, Local not specified - PPO | Admitting: Pharmacist

## 2020-11-04 ENCOUNTER — Other Ambulatory Visit: Payer: Self-pay

## 2020-11-04 VITALS — BP 106/78 | HR 94 | Resp 16 | Ht 76.0 in | Wt 191.6 lb

## 2020-11-04 DIAGNOSIS — E785 Hyperlipidemia, unspecified: Secondary | ICD-10-CM | POA: Diagnosis not present

## 2020-11-04 NOTE — Patient Instructions (Addendum)
Your Results:             Your most recent labs Goal  Total Cholesterol 185 < 200  Triglycerides 89 < 150  HDL (good cholesterol) 48 > 40  LDL (bad cholesterol) 121 < 70      Medication changes: *CONTINUE titration of atorvastatin 10mg  up to 3 times per week*  Lab orders: *REPEAT fasting blood work 8 weeks after reaching maximum tolerated*  Clinic phone number: [phone]  Thank you for choosing [facility]

## 2020-11-04 NOTE — Progress Notes (Signed)
Patient ID: Alan [name]                 DOB: [birth year]                    MRN: [id number]     HPI: Alan [name] is a 65 y.o. male patient referred to lipid clinic by . PMH is significant for CAD s/p PCI, hypertension, mitral valve prolapse, SVT and stage 3 adenocarcinoma.   Patient postponed his visit with lipid clinic while undergoing chemotherapy for stage 3 adenocarcinoma. Currently on remission and done with chemotherapy and radiation. He is a good candidate after inability to tolerate rosuvastatin or atorvastatin for secondary prevention. Per patient , he is back taking amlodipine and started after most recent blood work was done.   Current Medications:  Atorvastatin 10mg  daily - some nausea bit still taking 3x/week  Intolerances:  Rosuvastatin - GI intolerance  LDL goal: < 70mg /dL  Diet: general diet, trying to recover some wight loss during chemotherapy/radiation. Still cautions about low amount of fatty foods or simple carbohydrates.  Exercise: 49min spin class 2x/weekly  Social History: denies smoking or any tobacco history. Used to drink ~ 10 drinks per week (beer and liquor).  Labs: 10/22/2020: CHO 185, TG 89, HDL 48, LDL 121 (prior to atorvastatin 10mg )  Past Medical History:  Diagnosis Date  . Adenocarcinoma of right lung, stage 3 ([place]) dx'd 03/2019  . CAD S/P percutaneous coronary angioplasty 2004   [facility] STEMI) - Prox LAD 100% => 2 overlapping 3.5 x 1.8 mm Cypher DES stents.;  Patent as of August 2015  . Dyslipidemia, goal LDL below 70   . Essential hypertension   . GERD (gastroesophageal reflux disease)   . History of Mitral valve prolapse    Moderate - with moderate MR, noted February t 2013  . History of Severe mitral regurgitation by prior echocardiogram 02/06/2014   TEE: Severe mitral regurgitation with a flail P2 segment and ruptured; Normal LV size & function, dilated LA.  . Incidental lung nodule, greater than or equal to 24mm 03/16/2014   Ground glass  opacity RML noted on CT scan  . PONV (postoperative nausea and vomiting)   . 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 ST elevation myocardial infarction (STEMI) of anterior wall, subsequent episode of care [facility]) 2004   he had a proximal LAD occlusion treated with 2 overlapping 3.5 x 1.8 mm Cypher DES stents.    Current Outpatient Medications on File Prior to Visit  Medication Sig Dispense Refill  . Ascorbic Acid (VITAMIN C) 1000 MG tablet Take 2,000 mg by mouth daily.     Marland Kitchen atorvastatin (LIPITOR) 10 MG tablet Take 1 tablet (10 mg total) by mouth daily. 30 tablet 11  . Coenzyme Q10 (CO Q 10) 100 MG CAPS Take 100 mg by mouth daily.     . Hypromellose (HYDROXYPROPYL METHYLCELLULOSE) POWD     . magnesium 30 MG tablet Take 1 tablet by mouth at bedtime.    . Melatonin 10 MG TABS Take 10 mg by mouth at bedtime.    Marland Kitchen Specialty Vitamins Products (PROSTATE PO) Take 1 capsule by mouth daily. PROSTATE FORMULA    . vitamin B-12 (CYANOCOBALAMIN) 1000 MCG tablet Take 1,000 mcg  by mouth daily.    . Vitamin D-Vitamin K (D3 + K2 DOTS PO) Take 1 tablet by mouth daily.     No current facility-administered medications on file prior to visit.    Allergies  Allergen Reactions  . A-Cillin [Ampicillin] Shortness Of Breath, Swelling and Rash    Did it involve swelling of the face/tongue/throat, SOB, or low BP? Yes Did it involve sudden or severe rash/hives, skin peeling, or any reaction on the inside of your mouth or nose? Yes Did you need to seek medical attention at a hospital or doctor's office? Yes When did it last happen?~20 years ago If all above answers are "NO", may proceed with cephalosporin use.    Marland Kitchen Amoxicillin Hives,  Shortness Of Breath and Swelling    Did it involve swelling of the face/tongue/throat, SOB, or low BP? Yes Did it involve sudden or severe rash/hives, skin peeling, or any reaction on the inside of your mouth or nose? Yes Did you need to seek medical attention at a hospital or doctor's office? Yes When did it last happen?~20 years ago If all above answers are "NO", may proceed with cephalosporin use.   . Other Hives, Shortness Of Breath and Swelling    ALL "CILLINS"  . Penicillins Hives, Shortness Of Breath and Swelling    Did it involve swelling of the face/tongue/throat, SOB, or low BP? Yes Did it involve sudden or severe rash/hives, skin peeling, or any reaction on the inside of your mouth or nose? Yes Did you need to seek medical attention at a hospital or doctor's office? Yes When did it last happen?~20 years ago If all above answers are "NO", may proceed with cephalosporin use.    . Sulfa Antibiotics Nausea Only  . Crestor [Rosuvastatin] Other (See Comments)    G I upset    Dyslipidemia, goal LDL below 70 LDL remains above goal for secondary prevention. Patient was intolerant to rosuvastatin, but currently taking atorvastatin 10mg  3x/week. We discussed PCSK9i therapy, and bempedoic acid. Patient is reluctant to try anything else for now. Started he started atorvastatin therapy after recent blood work and is more interested in knowing what caused his cancer.   He agreed on taking atorvastatin 10mg  3 times per week, continue low fat diet, and increase physical activity back to baseline prior to cancer diagnosis. Will repeat fasting blood work in 6-8 weeks, and re-assess need to addiotional therapy. Patient will prefer to increase atorvastatin to daily dose 1st, then may consider injectable products.   [place] Dohmen Rodriguez-Guzman PharmD, BCPS, [place] [id number] 11/11/2020 12:04 PM

## 2020-11-11 ENCOUNTER — Encounter: Payer: Self-pay | Admitting: Pharmacist

## 2020-11-11 NOTE — Assessment & Plan Note (Signed)
LDL remains above goal for secondary prevention. Patient was intolerant to rosuvastatin, but currently taking atorvastatin 10mg  3x/week. We discussed PCSK9i therapy, and bempedoic acid. Patient is reluctant to try anything else for now. Started he started atorvastatin therapy after recent blood work and is more interested in knowing what caused his cancer.   He agreed on taking atorvastatin 10mg  3 times per week, continue low fat diet, and increase physical activity back to baseline prior to cancer diagnosis. Will repeat fasting blood work in 6-8 weeks, and re-assess need to addiotional therapy. Patient will prefer to increase atorvastatin to daily dose 1st, then may consider injectable products.

## 2020-11-20 IMAGING — CT NM PET TUM IMG INITIAL (PI) SKULL BASE T - THIGH
1 of 8 series · 1 of 25 positions shown · non-contrast
Comparison: None.

CLINICAL DATA: Initial treatment strategy for lung mass.

EXAM:
NUCLEAR MEDICINE PET SKULL BASE TO THIGH
TECHNIQUE: 9.8 mCi F-18 FDG was injected intravenously. Full-ring PET imaging
was performed from the skull base to thigh after the radiotracer. CT
data was obtained and used for attenuation correction and anatomic
localization.
Fasting blood glucose: 101 mg/dl

[Series 3: pet sk_thigh ac · axial · 5.0mm · 4.07mm/px · 1 of 242 slices shown]
[im 161/242]
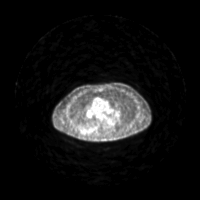

[1 of 25 positions shown; findings below may reference images not displayed]

FINDINGS: Mediastinal blood pool activity: SUV max

Liver activity: SUV max NA

NECK: No hypermetabolic lymph nodes in the neck. No hypermetabolic
supraclavicular nodes.

Incidental CT findings: none

CHEST: Hypermetabolic high RIGHT paratracheal lymph node measures 9
mm (image 57/4 with SUV max equal 4.2.

Hypermetabolic prevascular and RIGHT lower paratracheal lymph nodes.
Example prevascular node anterior to the ascending aorta measuring
10 mm SUV max equal 7.8. Hypermetabolic contralateral RIGHT lower
paratracheal node measuring only 8 mm SUV max equal 4.0.
Hypermetabolic RIGHT paratracheal nodes with SUV max equal 5.0.

Large intensely hypermetabolic RIGHT hilar lymph node measuring
approximately 1.7 cm SUV max equal 9.2.

Incidental CT findings: none

ABDOMEN/PELVIS: No abnormal hypermetabolic activity within the
liver, pancreas, adrenal glands, or spleen. No hypermetabolic lymph
nodes in the abdomen or pelvis.

Incidental CT findings: Prostate normal

SKELETON: No focal hypermetabolic activity to suggest skeletal
metastasis.

Incidental CT findings: none
IMPRESSION: 1. Hypermetabolic nodule in the in the RIGHT lower lobe along the
fissure may represent primary bronchogenic carcinoma.
2. Enlarged hypermetabolic RIGHT hilar node/nodule is favored
metastatic bronchogenic carcinoma node versus primary lesion.
3. Mediastinal nodal metastasis to the ipsilateral and contralateral
nodal stations. No supraclavicular adenopathy.
4. No evidence distant metastatic disease.
5. Peripheral consolidation in the RIGHT lower lobe and thickening
along the fissures is favored chronic inflammatory or infectious
process.

If multidisciplinary follow up management is desired, this is
available in the [REDACTED] through the [REDACTED] [phone].

These results will be called to the ordering clinician or
representative by the Radiologist Assistant, and communication
documented in the PACS or zVision Dashboard.

## 2020-12-06 ENCOUNTER — Telehealth: Payer: Self-pay

## 2020-12-06 DIAGNOSIS — E785 Hyperlipidemia, unspecified: Secondary | ICD-10-CM

## 2020-12-06 NOTE — Telephone Encounter (Signed)
Called to see if the pt was taking the atorvastatin and they stated that they are and fasting lab work ordered pt voiced understanding to complete those asap

## 2020-12-06 NOTE — Telephone Encounter (Signed)
-----   Message from Harrington Challenger, [place] sent at 12/06/2020  7:51 AM EDT ----- Regarding: Lipids Please call this patient . Due to repeat fasting lipid panel and LFTs if taking atorvastatin 2-3 dasy per week.  Thanks ----- Message ----- From: Harrington Challenger, RPH-CPP Sent: 12/06/2020  12:00 AM EDT To: Roxanne Mins Rodriguez-Guzman, RPH-CPP  Taking atorvastatin? 3x/week?  Repeat fasting blood work to determine if need to increase dose.

## 2020-12-07 LAB — LIPID PANEL
Chol/HDL Ratio: 3.2 ratio (ref 0.0–5.0)
Cholesterol, Total: 160 mg/dL (ref 100–199)
HDL: 50 mg/dL (ref >39)
LDL Chol Calc (NIH): 95 mg/dL (ref 0–99)
Triglycerides: 78 mg/dL (ref 0–149)
VLDL Cholesterol Cal: 15 mg/dL (ref 5–40)

## 2020-12-07 LAB — HEPATIC FUNCTION PANEL
ALT: 14 IU/L (ref 0–44)
AST: 22 IU/L (ref 0–40)
Albumin: 4.6 g/dL (ref 3.8–4.8)
Alkaline Phosphatase: 92 IU/L (ref 44–121)
Bilirubin Total: 0.9 mg/dL (ref 0.0–1.2)
Bilirubin, Direct: 0.24 mg/dL (ref 0.00–0.40)
Total Protein: 6.2 g/dL (ref 6.0–8.5)

## 2020-12-08 ENCOUNTER — Telehealth: Payer: Self-pay | Admitting: Internal Medicine

## 2020-12-08 NOTE — Telephone Encounter (Signed)
R/s 5/9 appt to accommodate Provider Pal. Called and spoke with patient. Confirmed new date and time

## 2020-12-09 ENCOUNTER — Other Ambulatory Visit: Payer: Self-pay

## 2020-12-09 DIAGNOSIS — E785 Hyperlipidemia, unspecified: Secondary | ICD-10-CM

## 2020-12-17 ENCOUNTER — Inpatient Hospital Stay: Payer: Federal, State, Local not specified - PPO | Attending: Internal Medicine

## 2020-12-17 ENCOUNTER — Other Ambulatory Visit: Payer: Self-pay

## 2020-12-17 ENCOUNTER — Ambulatory Visit (HOSPITAL_COMMUNITY)
Admission: RE | Admit: 2020-12-17 | Discharge: 2020-12-17 | Disposition: A | Discharge status: Home / Self Care | Payer: Federal, State, Local not specified - PPO | Source: Ambulatory Visit | Attending: Internal Medicine | Admitting: Internal Medicine

## 2020-12-17 DIAGNOSIS — U071 COVID-19: Secondary | ICD-10-CM | POA: Insufficient documentation

## 2020-12-17 DIAGNOSIS — C3431 Malignant neoplasm of lower lobe, right bronchus or lung: Secondary | ICD-10-CM | POA: Diagnosis present

## 2020-12-17 DIAGNOSIS — Z923 Personal history of irradiation: Secondary | ICD-10-CM | POA: Insufficient documentation

## 2020-12-17 DIAGNOSIS — C3490 Malignant neoplasm of unspecified part of unspecified bronchus or lung: Secondary | ICD-10-CM | POA: Diagnosis present

## 2020-12-17 DIAGNOSIS — Z79899 Other long term (current) drug therapy: Secondary | ICD-10-CM | POA: Diagnosis not present

## 2020-12-17 DIAGNOSIS — Z9221 Personal history of antineoplastic chemotherapy: Secondary | ICD-10-CM | POA: Insufficient documentation

## 2020-12-17 LAB — CBC WITH DIFFERENTIAL (CANCER CENTER ONLY)
Abs Immature Granulocytes: 0.01 10*3/uL (ref 0.00–0.07)
Basophils Absolute: 0 10*3/uL (ref 0.0–0.1)
Basophils Relative: 1 %
Eosinophils Absolute: 0.1 10*3/uL (ref 0.0–0.5)
Eosinophils Relative: 1 %
HCT: 45.4 % (ref 39.0–52.0)
Hemoglobin: 15.3 g/dL (ref 13.0–17.0)
Immature Granulocytes: 0 %
Lymphocytes Relative: 19 %
Lymphs Abs: 0.7 10*3/uL (ref 0.7–4.0)
MCH: 31.5 pg (ref 26.0–34.0)
MCHC: 33.7 g/dL (ref 30.0–36.0)
MCV: 93.6 fL (ref 80.0–100.0)
Monocytes Absolute: 0.5 10*3/uL (ref 0.1–1.0)
Monocytes Relative: 15 %
Neutro Abs: 2.2 10*3/uL (ref 1.7–7.7)
Neutrophils Relative %: 64 %
Platelet Count: 148 10*3/uL — ABNORMAL LOW (ref 150–400)
RBC: 4.85 MIL/uL (ref 4.22–5.81)
RDW: 12.2 % (ref 11.5–15.5)
WBC Count: 3.5 10*3/uL — ABNORMAL LOW (ref 4.0–10.5)
nRBC: 0 % (ref 0.0–0.2)

## 2020-12-17 LAB — CMP (CANCER CENTER ONLY)
ALT: 20 U/L (ref 0–44)
AST: 28 U/L (ref 15–41)
Albumin: 4.2 g/dL (ref 3.5–5.0)
Alkaline Phosphatase: 89 U/L (ref 38–126)
Anion gap: 10 (ref 5–15)
BUN: 15 mg/dL (ref 8–23)
CO2: 26 mmol/L (ref 22–32)
Calcium: 9.4 mg/dL (ref 8.9–10.3)
Chloride: 107 mmol/L (ref 98–111)
Creatinine: 0.78 mg/dL (ref 0.61–1.24)
GFR, Estimated: >60 mL/min (ref >60)
Glucose, Bld: 91 mg/dL (ref 70–99)
Potassium: 4.1 mmol/L (ref 3.5–5.1)
Sodium: 143 mmol/L (ref 135–145)
Total Bilirubin: 0.6 mg/dL (ref 0.3–1.2)
Total Protein: 7.1 g/dL (ref 6.5–8.1)

## 2020-12-17 MED ORDER — IOHEXOL 300 MG/ML  SOLN
75.0000 mL | Freq: Once | INTRAMUSCULAR | Status: AC | PRN
  Administered 2020-12-17: 75 mL via INTRAVENOUS

## 2020-12-18 ENCOUNTER — Telehealth: Payer: Self-pay | Admitting: Adult Health

## 2020-12-18 NOTE — Telephone Encounter (Signed)
Called to discuss with patient about COVID-19 symptoms and the use of one of the available treatments for those with mild to moderate Covid symptoms and at a high risk of hospitalization.  Pt appears to qualify for outpatient treatment due to co-morbid conditions and/or a member of an at-risk group in accordance with the FDA Emergency Use Authorization.    Symptom onset: 5/2 Vaccinated:  Booster?  Immunocompromised? yes Qualifiers: lung cancer, age, HTN, CAD NIH Criteria: 2  Reviewed with patient.  He is day 6 of his therapy. He is outside the window for oral treatment and will be outside the window for IV treatment by the time we would be able to infuse him (Monday).  I recommended that continue with supportive measures, and at this point, he is improving symptomatically and will hopefully continue to do so.    Scot Dock

## 2020-12-20 ENCOUNTER — Telehealth: Payer: Self-pay | Admitting: Internal Medicine

## 2020-12-20 ENCOUNTER — Ambulatory Visit: Payer: Federal, State, Local not specified - PPO | Admitting: Internal Medicine

## 2020-12-20 NOTE — Telephone Encounter (Signed)
Called pt to r/s appt per 5/9 sch msg. No answer. Left msg for pt to call back to r/s.

## 2020-12-22 ENCOUNTER — Other Ambulatory Visit: Payer: Self-pay

## 2020-12-22 ENCOUNTER — Ambulatory Visit: Payer: Federal, State, Local not specified - PPO | Admitting: Internal Medicine

## 2020-12-22 ENCOUNTER — Inpatient Hospital Stay (HOSPITAL_BASED_OUTPATIENT_CLINIC_OR_DEPARTMENT_OTHER): Payer: Federal, State, Local not specified - PPO | Admitting: Internal Medicine

## 2020-12-22 DIAGNOSIS — C3490 Malignant neoplasm of unspecified part of unspecified bronchus or lung: Secondary | ICD-10-CM

## 2020-12-22 DIAGNOSIS — C3491 Malignant neoplasm of unspecified part of right bronchus or lung: Secondary | ICD-10-CM | POA: Diagnosis not present

## 2020-12-22 NOTE — Progress Notes (Signed)
[facility] [phone]   [phone]  PROGRESS NOTE FOR TELEMEDICINE VISITS  [name], L.[name], [address] [place] [address]  I connected with@ on 12/22/20 at  9:30 AM EDT by video enabled telemedicine visit and verified that I am speaking with the correct person using two identifiers.   I discussed the limitations, risks, security and privacy concerns of performing an evaluation and management service by telemedicine and the availability of in-person appointments. I also discussed with the patient that there may be a patient responsible charge related to this service. The patient expressed understanding and agreed to proceed.  Other persons participating in the visit and their role in the encounter: None  Patient's location:  Home Provider's location: [place]  DIAGNOSIS: stage IIIb (T2b, N3, M0) non-small cell lung cancer, adenocarcinoma presented with right lower lobe lung nodule in addition to right hilar mass/node as well as ipsilateral and contralateral mediastinal lymphadenopathy diagnosed in October 2020.  Molecular studies by [employer] showed no actionable mutations.  PRIOR THERAPY:  1) Concurrent chemoradiation with weekly carboplatin for AUC of 2 and paclitaxel 45 mg/M2.  Status post 7 cycles.  Last dose was given July 21, 2019. 2) Consolidation treatment with immunotherapy with Imfinzi 1500 mg.  Status post 13 cycles.  Last dose was given August 19, 2020.  CURRENT THERAPY: Observation.  INTERVAL HISTORY: Alan [name] 65 y.o. male has a MyChart virtual video visit with me today for evaluation and discussion of his scan results.  The patient was tested positive for COVID last week and he changed his visit to virtual.  He denied having any current chest pain, shortness of breath, cough or hemoptysis.  He denied having any nausea, vomiting, diarrhea or constipation.  He has no headache or  visual changes.  He had CT scan of the chest performed recently and we are having the visit for discussion of his scan results and recommendation regarding his condition.  MEDICAL HISTORY: Past Medical History:  Diagnosis Date  . Adenocarcinoma of right lung, stage 3 ([place]) dx'd 03/2019  . CAD S/P percutaneous coronary angioplasty 2004   [facility] STEMI) - Prox LAD 100% => 2 overlapping 3.5 x 1.8 mm Cypher DES stents.;  Patent as of August 2015  . Dyslipidemia, goal LDL below 70   . Essential hypertension   . GERD (gastroesophageal reflux disease)   . History of Mitral valve prolapse    Moderate - with moderate MR, noted February t 2013  . History of Severe mitral regurgitation by prior echocardiogram 02/06/2014   TEE: Severe mitral regurgitation with a flail P2 segment and ruptured; Normal LV size & function, dilated LA.  . Incidental lung nodule, greater than or equal to 34mm 03/16/2014   Ground glass opacity RML noted on CT scan  . PONV (postoperative nausea and vomiting)   . 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 ST elevation myocardial infarction (STEMI) of anterior wall, subsequent episode of care [facility]) 2004   he had a proximal LAD occlusion treated with 2 overlapping 3.5 x 1.8 mm Cypher DES stents.    ALLERGIES:  is allergic to a-cillin [ampicillin], amoxicillin, other, penicillins, sulfa antibiotics, and crestor [rosuvastatin].  MEDICATIONS:  Current Outpatient Medications  Medication Sig Dispense Refill  . Ascorbic Acid (VITAMIN C) 1000 MG tablet Take 2,000 mg by mouth daily.     Marland Kitchen atorvastatin (LIPITOR) 10 MG tablet Take 1 tablet (10 mg total) by mouth daily. 30 tablet 11  . Coenzyme Q10 (CO Q 10) 100 MG CAPS  Take 100 mg by mouth daily.     . Hypromellose (HYDROXYPROPYL METHYLCELLULOSE) POWD     . magnesium 30 MG tablet Take 1 tablet by mouth at bedtime.    . Melatonin 10 MG TABS Take 10 mg by mouth at bedtime.    Marland Kitchen Specialty Vitamins Products (PROSTATE PO) Take 1 capsule by mouth daily. PROSTATE FORMULA    . vitamin B-12 (CYANOCOBALAMIN) 1000 MCG tablet Take 1,000 mcg by mouth daily.    . Vitamin D-Vitamin K (D3 + K2 DOTS PO) Take 1 tablet by mouth daily.     No current facility-administered medications for this visit.    SURGICAL HISTORY:  Past Surgical History:  Procedure Laterality Date  . ANTERIOR CRUCIATE LIGAMENT REPAIR Left 1993  . COLONOSCOPY    . INTRAOPERATIVE TRANSESOPHAGEAL ECHOCARDIOGRAM N/A 04/08/2014   Procedure: INTRAOPERATIVE TRANSESOPHAGEAL ECHOCARDIOGRAM;  Surgeon: Rexene Alberts, MD;  Location: [facility];  Service: Open Heart Surgery;  Laterality: N/A;  . LEFT AND RIGHT HEART CATHETERIZATION WITH CORONARY ANGIOGRAM N/A 03/05/2014   Procedure: LEFT AND RIGHT HEART CATHETERIZATION WITH CORONARY ANGIOGRAM;  Surgeon: Leonie Man, MD;  Location: [facility] CATH LAB;  Service: Cardiovascular; (pre-op) Widely patent LAD stents, 40% distal LAD, 30% Cx, ~50% PL-PDA bifurcation lesion   . LEFT HEART CATH AND CORONARY ANGIOGRAPHY  2004   In setting of anterior STEMI, found to have 100% % proximal LAD (2 DES Cypher stents)  . LEFT HEART CATH AND CORONARY ANGIOGRAPHY  2007   Widely patent LAD stents, 40% distal LAD, 30% Cx, ~50% PL-PDA bifurcation lesion   . LEFT HEART CATHETERIZATION WITH CORONARY ANGIOGRAM N/A 09/15/2011   Procedure: LEFT HEART CATHETERIZATION WITH CORONARY ANGIOGRAM;  Surgeon: Lorretta Harp, MD;  Location: [facility] CATH LAB;  Service: Cardiovascular;  Laterality: N/A;  . MITRAL VALVE REPAIR Right 04/08/2014   Procedure: MINIMALLY INVASIVE MITRAL VALVE REPAIR (MVR);  Surgeon: Rexene Alberts, MD;  Location: [facility];  Service: Open Heart Surgery;  Laterality: Right;  . NM MYOVIEW LTD   May 2011   Walk 9 minutes, and 10 METs, diaphragmatic attenuation but no ischemia or infarction.  . SVT ABLATION N/A 10/17/2019   Procedure: SVT ABLATION;  Surgeon: Constance Haw, MD;  Location: [facility] CV LAB;  Service: Cardiovascular;  Laterality: N/A;  . TEE WITHOUT CARDIOVERSION N/A 02/06/2014   Procedure: TRANSESOPHAGEAL ECHOCARDIOGRAM (TEE);  Surgeon: Pixie Casino, MD;  Normal LV Size U& function - EF 55-60%, no regional WMA.  MV P2 Leaflet is flail with ruptured chord with severe prolapse, anterior leaflet intact.  Severe, eccentric anterior directed MR with dilated LA.  Marland Kitchen TRANSTHORACIC ECHOCARDIOGRAM  07/18/2018   Normal LV size and function.  EF 50-60 %.  Unable to assess diastolic function.  Mitral valve sewing ring in place.  Mild stenosis with a gradient of 6 mmHg noted. -  . TRANSTHORACIC ECHOCARDIOGRAM  06/30/2015   Low normal LV function with EF 50-55%. GR 1 DD. No MR. Normal gradient of 4 mmHg the mitral valve. No prolapse.  Marland Kitchen VIDEO BRONCHOSCOPY WITH ENDOBRONCHIAL ULTRASOUND N/A 05/16/2019   Procedure: VIDEO BRONCHOSCOPY WITH ENDOBRONCHIAL ULTRASOUND;  Surgeon: Marshell Garfinkel, MD;  Location: [facility];  Service:  Pulmonary;  Laterality: N/A;    REVIEW OF SYSTEMS:  A comprehensive review of systems was negative.    LABORATORY DATA: Lab Results  Component Value Date   WBC 3.5 (L) 12/17/2020   HGB 15.3 12/17/2020   HCT 45.4 12/17/2020   MCV 93.6 12/17/2020   PLT 148 (L) 12/17/2020      Chemistry      Component Value Date/Time   NA 143 12/17/2020 0844   NA 141 02/25/2020 0915   K 4.1 12/17/2020 0844   CL 107 12/17/2020 0844   CO2 26 12/17/2020 0844   BUN 15 12/17/2020 0844   BUN 14 02/25/2020 0915   CREATININE 0.78 12/17/2020 0844   CREATININE 0.80 02/27/2014 1152      Component Value Date/Time   CALCIUM 9.4 12/17/2020 0844   ALKPHOS 89 12/17/2020 0844   AST 28 12/17/2020 0844   ALT 20 12/17/2020 0844   BILITOT 0.6 12/17/2020 0844        RADIOGRAPHIC STUDIES: CT Chest W Contrast  Result Date: 12/18/2020 CLINICAL DATA:  Non-small cell lung cancer EXAM: CT CHEST WITH CONTRAST TECHNIQUE: Multidetector CT imaging of the chest was performed during intravenous contrast administration. CONTRAST:  71mL OMNIPAQUE IOHEXOL 300 MG/ML  SOLN COMPARISON:  09/13/2020 FINDINGS: Cardiovascular: The heart is normal in size. Right cardiomediastinal shift. No pericardial effusion. Prosthetic mitral valve. No evidence of thoracic aortic aneurysm. Atherosclerotic calcifications of the aortic arch. Coronary atherosclerosis of the LAD and right coronary artery. Mediastinum/Nodes: No suspicious mediastinal lymphadenopathy. Visualized thyroid is unremarkable. Lungs/Pleura: Radiation changes in the right paramediastinal region and central right lower lobe with associated volume loss. No focal consolidation. No suspicious pulmonary nodules. Mild loculated right pleural effusion, unchanged. No pneumothorax. Upper Abdomen: Visualized upper abdomen is grossly unremarkable. Musculoskeletal: Visualized osseous structures are within normal limits. IMPRESSION: Radiation changes in the right hemithorax. No evidence of recurrent or metastatic disease. Aortic Atherosclerosis (ICD10-I70.0). Electronically Signed   By: Julian Hy M.D.   On: 12/18/2020 11:14    ASSESSMENT AND PLAN: This is a very pleasant 65 years old white male recently diagnosed with a stage IIIb non-small cell lung cancer, adenocarcinoma with no actionable mutation.  He completed a course of concurrent chemoradiation with weekly carboplatin and paclitaxel status post 7 cycles.   His treatment was complicated with dysphagia, odynophagia secondary to radiation induced esophagitis.  He also has persistent cough and shortness of breath secondary to radiation-induced pneumonitis. He has partial response after this treatment. The patient completed treatment with consolidation immunotherapy with Imfinzi  1500 mg IV every 4 weeks, status post 13 cycles.  Last dose was in January 2022. The patient is currently on observation and he is feeling fine today with no concerning complaints. Repeat CT scan of the chest showed no concerning findings for disease recurrence or metastasis.  He continues to have the radiation changes in the right hemithorax. I discussed the scan with the patient and recommended for him to continue on observation with repeat CT scan of the chest in 4 months. He was advised to call immediately if he has any other concerning symptoms in the interval. I discussed the assessment and treatment plan with the patient. The patient was provided an opportunity to ask questions and all were answered. The patient agreed with the plan and demonstrated an understanding of the instructions.   The patient was advised to call back or seek an in-person evaluation if the symptoms worsen or if the condition fails to improve as anticipated.  I provided 20 minutes of face-to-face video visit time during this encounter, and > 50% was spent counseling as documented under my assessment & plan.  Eilleen Kempf, MD 12/22/2020 9:49 AM  Disclaimer: This note was dictated with voice recognition software. Similar sounding words can inadvertently be transcribed and may not be corrected upon review.

## 2021-04-22 ENCOUNTER — Inpatient Hospital Stay: Payer: Federal, State, Local not specified - PPO | Attending: Internal Medicine

## 2021-04-22 ENCOUNTER — Other Ambulatory Visit: Payer: Self-pay

## 2021-04-22 ENCOUNTER — Ambulatory Visit (HOSPITAL_COMMUNITY)
Admission: RE | Admit: 2021-04-22 | Discharge: 2021-04-22 | Disposition: A | Discharge status: Home / Self Care | Payer: Federal, State, Local not specified - PPO | Source: Ambulatory Visit | Attending: Internal Medicine | Admitting: Internal Medicine

## 2021-04-22 DIAGNOSIS — C3431 Malignant neoplasm of lower lobe, right bronchus or lung: Secondary | ICD-10-CM | POA: Diagnosis present

## 2021-04-22 DIAGNOSIS — Z79899 Other long term (current) drug therapy: Secondary | ICD-10-CM | POA: Insufficient documentation

## 2021-04-22 DIAGNOSIS — C3490 Malignant neoplasm of unspecified part of unspecified bronchus or lung: Secondary | ICD-10-CM | POA: Diagnosis present

## 2021-04-22 LAB — CMP (CANCER CENTER ONLY)
ALT: 23 U/L (ref 0–44)
AST: 23 U/L (ref 15–41)
Albumin: 4.3 g/dL (ref 3.5–5.0)
Alkaline Phosphatase: 88 U/L (ref 38–126)
Anion gap: 10 (ref 5–15)
BUN: 15 mg/dL (ref 8–23)
CO2: 23 mmol/L (ref 22–32)
Calcium: 9.8 mg/dL (ref 8.9–10.3)
Chloride: 108 mmol/L (ref 98–111)
Creatinine: 0.8 mg/dL (ref 0.61–1.24)
GFR, Estimated: >60 mL/min (ref >60)
Glucose, Bld: 97 mg/dL (ref 70–99)
Potassium: 3.9 mmol/L (ref 3.5–5.1)
Sodium: 141 mmol/L (ref 135–145)
Total Bilirubin: 1.7 mg/dL — ABNORMAL HIGH (ref 0.3–1.2)
Total Protein: 6.8 g/dL (ref 6.5–8.1)

## 2021-04-22 LAB — CBC WITH DIFFERENTIAL (CANCER CENTER ONLY)
Abs Immature Granulocytes: 0.02 10*3/uL (ref 0.00–0.07)
Basophils Absolute: 0 10*3/uL (ref 0.0–0.1)
Basophils Relative: 0 %
Eosinophils Absolute: 0.1 10*3/uL (ref 0.0–0.5)
Eosinophils Relative: 3 %
HCT: 43 % (ref 39.0–52.0)
Hemoglobin: 14.7 g/dL (ref 13.0–17.0)
Immature Granulocytes: 0 %
Lymphocytes Relative: 18 %
Lymphs Abs: 0.9 10*3/uL (ref 0.7–4.0)
MCH: 31.7 pg (ref 26.0–34.0)
MCHC: 34.2 g/dL (ref 30.0–36.0)
MCV: 92.7 fL (ref 80.0–100.0)
Monocytes Absolute: 0.5 10*3/uL (ref 0.1–1.0)
Monocytes Relative: 11 %
Neutro Abs: 3.4 10*3/uL (ref 1.7–7.7)
Neutrophils Relative %: 68 %
Platelet Count: 175 10*3/uL (ref 150–400)
RBC: 4.64 MIL/uL (ref 4.22–5.81)
RDW: 12.2 % (ref 11.5–15.5)
WBC Count: 5 10*3/uL (ref 4.0–10.5)
nRBC: 0 % (ref 0.0–0.2)

## 2021-04-22 MED ORDER — IOHEXOL 350 MG/ML SOLN
75.0000 mL | Freq: Once | INTRAVENOUS | Status: AC | PRN
  Administered 2021-04-22: 60 mL via INTRAVENOUS

## 2021-04-25 ENCOUNTER — Inpatient Hospital Stay (HOSPITAL_BASED_OUTPATIENT_CLINIC_OR_DEPARTMENT_OTHER): Payer: Federal, State, Local not specified - PPO | Admitting: Internal Medicine

## 2021-04-25 ENCOUNTER — Other Ambulatory Visit: Payer: Self-pay

## 2021-04-25 VITALS — BP 119/88 | HR 89 | Temp 96.8°F | Resp 18 | Ht 76.0 in | Wt 189.7 lb

## 2021-04-25 DIAGNOSIS — C3490 Malignant neoplasm of unspecified part of unspecified bronchus or lung: Secondary | ICD-10-CM

## 2021-04-25 DIAGNOSIS — C3431 Malignant neoplasm of lower lobe, right bronchus or lung: Secondary | ICD-10-CM | POA: Diagnosis not present

## 2021-04-25 NOTE — Progress Notes (Signed)
[place] [phone]   Fax:(336) [phone]  OFFICE PROGRESS NOTE  [name], L.[name], [place] [address] [place] [address]  DIAGNOSIS: stage IIIb (T2b, N3, M0) non-small cell lung cancer, adenocarcinoma presented with right lower lobe lung nodule in addition to right hilar mass/node as well as ipsilateral and contralateral mediastinal lymphadenopathy diagnosed in October 2020.  Molecular studies by Guardant 360 showed no actionable mutations.  PRIOR THERAPY:  1) Concurrent chemoradiation with weekly carboplatin for AUC of 2 and paclitaxel 45 mg/M2.  Status post 7 cycles.  Last dose was given July 21, 2019. 2) Consolidation treatment with immunotherapy with Imfinzi 1500 mg.  Status post 13 cycles.  Last dose was given August 19, 2020.  CURRENT THERAPY: Observation.  INTERVAL HISTORY: Alan [name] 65 y.o. male returns to the clinic today for follow-up visit.  The patient is feeling fine today with no concerning complaints.  He denied having any chest pain, shortness of breath, cough or hemoptysis.  He denied having any fever or chills.  He has no nausea, vomiting, diarrhea or constipation.  He denied having any headache or visual changes.  He has no weight loss or night sweats.  He is here today for evaluation with repeat CT scan of the chest for restaging of his disease.   MEDICAL HISTORY: Past Medical History:  Diagnosis Date   Adenocarcinoma of right lung, stage 3 ([place]) dx'd 03/2019   CAD S/P percutaneous coronary angioplasty 2004   (Ant STEMI) - Prox LAD 100% => 2 overlapping 3.5 x 1.8 mm Cypher DES stents.;  Patent as of August 2015   Dyslipidemia, goal LDL below 70    Essential hypertension    GERD (gastroesophageal reflux disease)    History of Mitral valve prolapse    Moderate - with moderate MR, noted February t 2013   History of Severe mitral regurgitation by prior echocardiogram 02/06/2014   TEE: Severe mitral  regurgitation with a flail P2 segment and ruptured; Normal LV size & function, dilated LA.   Incidental lung nodule, greater than or equal to 14mm 03/16/2014   Ground glass opacity RML noted on CT scan   PONV (postoperative nausea and vomiting)    S/P Minimally Invasive MVR (mitral valve repair) 04/08/2014   Complex valvuloplasty including quadrangular resection of flail segment of posterior leaflet, sliding leaflet plasty, chordal transfer x1, Gore-tex neocord placement x4 and 34 mm Sorin Memo 3D rechord ring annuloplasty via right mini thoracotomy approach   ST elevation myocardial infarction (STEMI) of anterior wall, subsequent episode of care ([place]) 2004   he had a proximal LAD occlusion treated with 2 overlapping 3.5 x 1.8 mm Cypher DES stents.    ALLERGIES:  is allergic to a-cillin [ampicillin], amoxicillin, other, penicillins, sulfa antibiotics, and crestor [rosuvastatin].  MEDICATIONS:  Current Outpatient Medications  Medication Sig Dispense Refill   Ascorbic Acid (VITAMIN C) 1000 MG tablet Take 2,000 mg by mouth daily.      atorvastatin (LIPITOR) 10 MG tablet Take 1 tablet (10 mg total) by mouth daily. 30 tablet 11   Coenzyme Q10 (CO Q 10) 100 MG CAPS Take 100 mg by mouth daily.      Hypromellose (HYDROXYPROPYL METHYLCELLULOSE) POWD      magnesium 30 MG tablet Take 1 tablet by mouth at bedtime.     Melatonin 10 MG TABS Take 10 mg by mouth at bedtime.     Specialty Vitamins Products (PROSTATE PO) Take 1 capsule  by mouth daily. PROSTATE FORMULA     vitamin B-12 (CYANOCOBALAMIN) 1000 MCG tablet Take 1,000 mcg by mouth daily.     Vitamin D-Vitamin K (D3 + K2 DOTS PO) Take 1 tablet by mouth daily.     No current facility-administered medications for this visit.    SURGICAL HISTORY:  Past Surgical History:  Procedure Laterality Date   ANTERIOR CRUCIATE LIGAMENT REPAIR Left 1993   COLONOSCOPY     INTRAOPERATIVE TRANSESOPHAGEAL ECHOCARDIOGRAM N/A 04/08/2014   Procedure: INTRAOPERATIVE  TRANSESOPHAGEAL ECHOCARDIOGRAM;  Surgeon: Rexene Alberts, MD;  Location: [place];  Service: Open Heart Surgery;  Laterality: N/A;   LEFT AND RIGHT HEART CATHETERIZATION WITH CORONARY ANGIOGRAM N/A 03/05/2014   Procedure: LEFT AND RIGHT HEART CATHETERIZATION WITH CORONARY ANGIOGRAM;  Surgeon: Leonie Man, MD;  Location: [facility] CATH LAB;  Service: Cardiovascular; (pre-op) Widely patent LAD stents, 40% distal LAD, 30% Cx, ~50% PL-PDA bifurcation lesion    LEFT HEART CATH AND CORONARY ANGIOGRAPHY  2004   In setting of anterior STEMI, found to have 100% % proximal LAD (2 DES Cypher stents)   LEFT HEART CATH AND CORONARY ANGIOGRAPHY  2007   Widely patent LAD stents, 40% distal LAD, 30% Cx, ~50% PL-PDA bifurcation lesion    LEFT HEART CATHETERIZATION WITH CORONARY ANGIOGRAM N/A 09/15/2011   Procedure: LEFT HEART CATHETERIZATION WITH CORONARY ANGIOGRAM;  Surgeon: Lorretta Harp, MD;  Location: [facility] CATH LAB;  Service: Cardiovascular;  Laterality: N/A;   MITRAL VALVE REPAIR Right 04/08/2014   Procedure: MINIMALLY INVASIVE MITRAL VALVE REPAIR (MVR);  Surgeon: Rexene Alberts, MD;  Location: [place];  Service: Open Heart Surgery;  Laterality: Right;   NM MYOVIEW LTD  May 2011   Walk 9 minutes, and 10 METs, diaphragmatic attenuation but no ischemia or infarction.   SVT ABLATION N/A 10/17/2019   Procedure: SVT ABLATION;  Surgeon: Constance Haw, MD;  Location: [place] CV LAB;  Service: Cardiovascular;  Laterality: N/A;   TEE WITHOUT CARDIOVERSION N/A 02/06/2014   Procedure: TRANSESOPHAGEAL ECHOCARDIOGRAM (TEE);  Surgeon: Pixie Casino, MD;  Normal LV Size U& function - EF 55-60%, no regional WMA.  MV P2 Leaflet is flail with ruptured chord with severe prolapse, anterior leaflet intact.  Severe, eccentric anterior directed MR with dilated LA.   TRANSTHORACIC ECHOCARDIOGRAM  07/18/2018   Normal LV size and function.  EF 50-60 %.  Unable to assess diastolic function.  Mitral valve sewing ring in place.  Mild  stenosis with a gradient of 6 mmHg noted. -   TRANSTHORACIC ECHOCARDIOGRAM  06/30/2015   Low normal LV function with EF 50-55%. GR 1 DD. No MR. Normal gradient of 4 mmHg the mitral valve. No prolapse.   VIDEO BRONCHOSCOPY WITH ENDOBRONCHIAL ULTRASOUND N/A 05/16/2019   Procedure: VIDEO BRONCHOSCOPY WITH ENDOBRONCHIAL ULTRASOUND;  Surgeon: Marshell Garfinkel, MD;  Location: [facility];  Service: Pulmonary;  Laterality: N/A;    REVIEW OF SYSTEMS:  A comprehensive review of systems was negative.   PHYSICAL EXAMINATION: General appearance: alert, cooperative, and no distress Head: Normocephalic, without obvious abnormality, atraumatic Neck: no adenopathy, no JVD, supple, symmetrical, trachea midline, and thyroid not enlarged, symmetric, no tenderness/mass/nodules Lymph nodes: Cervical, supraclavicular, and axillary nodes normal. Resp: clear to auscultation bilaterally Back: symmetric, no curvature. ROM normal. No CVA tenderness. Cardio: regular rate and rhythm, S1, S2 normal, no murmur, click, rub or gallop GI: soft, non-tender; bowel sounds normal; no masses,  no organomegaly Extremities: extremities normal, atraumatic, no cyanosis or edema  ECOG PERFORMANCE  STATUS: 1 - Symptomatic but completely ambulatory  Blood pressure 119/88, pulse 89, temperature (!) 96.8 F (36 C), temperature source Tympanic, resp. rate 18, height 6\' 4"  (1.93 m), weight 189 lb 11.2 oz (86 kg), SpO2 99 %.  LABORATORY DATA: Lab Results  Component Value Date   WBC 5.0 04/22/2021   HGB 14.7 04/22/2021   HCT 43.0 04/22/2021   MCV 92.7 04/22/2021   PLT 175 04/22/2021      Chemistry      Component Value Date/Time   NA 141 04/22/2021 1153   NA 141 02/25/2020 0915   K 3.9 04/22/2021 1153   CL 108 04/22/2021 1153   CO2 23 04/22/2021 1153   BUN 15 04/22/2021 1153   BUN 14 02/25/2020 0915   CREATININE 0.80 04/22/2021 1153   CREATININE 0.80 02/27/2014 1152      Component Value Date/Time   CALCIUM 9.8 04/22/2021 1153    ALKPHOS 88 04/22/2021 1153   AST 23 04/22/2021 1153   ALT 23 04/22/2021 1153   BILITOT 1.7 (H) 04/22/2021 1153       RADIOGRAPHIC STUDIES: CT Chest W Contrast  Result Date: 04/25/2021 CLINICAL DATA:  Non-small-cell lung cancer diagnosed 2 years ago. Radiation therapy and chemotherapy complete last year. Immunotherapy complete earlier this year. Asymptomatic. EXAM: CT CHEST WITH CONTRAST TECHNIQUE: Multidetector CT imaging of the chest was performed during intravenous contrast administration. CONTRAST:  38mL OMNIPAQUE IOHEXOL 350 MG/ML SOLN COMPARISON:  12/17/2020 FINDINGS: Cardiovascular: Aortic atherosclerosis. Tortuous thoracic aorta. Normal heart size, without pericardial effusion. Three vessel coronary artery calcification. Prior mitral valve repair. No evidence of pulmonary embolism. Mediastinum/Nodes: No supraclavicular adenopathy. No mediastinal or hilar adenopathy. Lungs/Pleura: Trace right-sided pleural fluid or thickening, similar. Similar appearance of right paramediastinal and perihilar radiation induced fibrosis. No residual or locally recurrent disease. Upper Abdomen: Too small to characterize segment 2 liver lesion was present on the prior. Normal imaged portions of the spleen, stomach, pancreas, gallbladder, adrenal glands, kidneys. Musculoskeletal: Lower cervical spine fixation. IMPRESSION: 1. Similar appearance of right-sided radiation induced fibrosis, without recurrent or metastatic disease. 2. Aortic Atherosclerosis (ICD10-I70.0). Coronary artery atherosclerosis. 3. Similar trace right pleural fluid/thickening. Electronically Signed   By: Abigail Miyamoto M.D.   On: 04/25/2021 10:29     ASSESSMENT AND PLAN: This is a very pleasant 65 years old white male recently diagnosed with a stage IIIb non-small cell lung cancer, adenocarcinoma with no actionable mutation.  He completed a course of concurrent chemoradiation with weekly carboplatin and paclitaxel status post 7 cycles.   His  treatment was complicated with dysphagia, odynophagia secondary to radiation induced esophagitis.  He also has persistent cough and shortness of breath secondary to radiation-induced pneumonitis. He has partial response after this treatment. The patient completed treatment with consolidation immunotherapy with Imfinzi 1500 mg IV every 4 weeks, status post 13 cycles.  Last dose was in January 2022. The patient tolerated his previous treatment fairly well with no concerning adverse effects. He is currently on observation and doing fine. He had repeat CT scan of the chest performed recently.  I personally and independently reviewed the scans and discussed the results with the patient today. His scan showed no concerning findings for disease recurrence or metastasis. I recommended for him to continue on observation with repeat CT scan of the chest in 4 months. His elevated bilirubin level is secondary to treatment with statin.  He will continue to follow-up with his cardiologist for adjustment of his medication. The patient was advised to call  immediately if he has any concerning symptoms in the interval. The patient voices understanding of current disease status and treatment options and is in agreement with the current care plan. All questions were answered. The patient knows to call the clinic with any problems, questions or concerns. We can certainly see the patient much sooner if necessary.  Disclaimer: This note was dictated with voice recognition software. Similar sounding words can inadvertently be transcribed and may not be corrected upon review.

## 2021-05-18 ENCOUNTER — Telehealth: Payer: Self-pay | Admitting: Internal Medicine

## 2021-05-18 NOTE — Telephone Encounter (Signed)
Shc per 9/12 los, pt aware

## 2021-08-19 ENCOUNTER — Other Ambulatory Visit: Payer: Self-pay

## 2021-08-19 ENCOUNTER — Inpatient Hospital Stay: Payer: Federal, State, Local not specified - PPO | Attending: Internal Medicine

## 2021-08-19 ENCOUNTER — Ambulatory Visit (HOSPITAL_COMMUNITY)
Admission: RE | Admit: 2021-08-19 | Discharge: 2021-08-19 | Disposition: A | Discharge status: Home / Self Care | Payer: Federal, State, Local not specified - PPO | Source: Ambulatory Visit | Attending: Internal Medicine | Admitting: Internal Medicine

## 2021-08-19 DIAGNOSIS — Z79899 Other long term (current) drug therapy: Secondary | ICD-10-CM | POA: Insufficient documentation

## 2021-08-19 DIAGNOSIS — C3490 Malignant neoplasm of unspecified part of unspecified bronchus or lung: Secondary | ICD-10-CM | POA: Insufficient documentation

## 2021-08-19 DIAGNOSIS — C771 Secondary and unspecified malignant neoplasm of intrathoracic lymph nodes: Secondary | ICD-10-CM | POA: Insufficient documentation

## 2021-08-19 DIAGNOSIS — C3431 Malignant neoplasm of lower lobe, right bronchus or lung: Secondary | ICD-10-CM | POA: Insufficient documentation

## 2021-08-19 LAB — CBC WITH DIFFERENTIAL (CANCER CENTER ONLY)
Abs Immature Granulocytes: 0.01 10*3/uL (ref 0.00–0.07)
Basophils Absolute: 0 10*3/uL (ref 0.0–0.1)
Basophils Relative: 0 %
Eosinophils Absolute: 0.1 10*3/uL (ref 0.0–0.5)
Eosinophils Relative: 3 %
HCT: 41.3 % (ref 39.0–52.0)
Hemoglobin: 14.2 g/dL (ref 13.0–17.0)
Immature Granulocytes: 0 %
Lymphocytes Relative: 20 %
Lymphs Abs: 0.9 10*3/uL (ref 0.7–4.0)
MCH: 32.3 pg (ref 26.0–34.0)
MCHC: 34.4 g/dL (ref 30.0–36.0)
MCV: 93.9 fL (ref 80.0–100.0)
Monocytes Absolute: 0.4 10*3/uL (ref 0.1–1.0)
Monocytes Relative: 9 %
Neutro Abs: 3.2 10*3/uL (ref 1.7–7.7)
Neutrophils Relative %: 68 %
Platelet Count: 189 10*3/uL (ref 150–400)
RBC: 4.4 MIL/uL (ref 4.22–5.81)
RDW: 12 % (ref 11.5–15.5)
WBC Count: 4.7 10*3/uL (ref 4.0–10.5)
nRBC: 0 % (ref 0.0–0.2)

## 2021-08-19 LAB — CMP (CANCER CENTER ONLY)
ALT: 18 U/L (ref 0–44)
AST: 22 U/L (ref 15–41)
Albumin: 4.4 g/dL (ref 3.5–5.0)
Alkaline Phosphatase: 84 U/L (ref 38–126)
Anion gap: 7 (ref 5–15)
BUN: 15 mg/dL (ref 8–23)
CO2: 29 mmol/L (ref 22–32)
Calcium: 9.7 mg/dL (ref 8.9–10.3)
Chloride: 105 mmol/L (ref 98–111)
Creatinine: 0.78 mg/dL (ref 0.61–1.24)
GFR, Estimated: >60 mL/min (ref >60)
Glucose, Bld: 96 mg/dL (ref 70–99)
Potassium: 4 mmol/L (ref 3.5–5.1)
Sodium: 141 mmol/L (ref 135–145)
Total Bilirubin: 0.9 mg/dL (ref 0.3–1.2)
Total Protein: 6.8 g/dL (ref 6.5–8.1)

## 2021-08-19 MED ORDER — IOHEXOL 350 MG/ML SOLN
75.0000 mL | Freq: Once | INTRAVENOUS | Status: AC | PRN
Start: 2021-08-19 — End: 2021-08-19
  Administered 2021-08-19: 75 mL via INTRAVENOUS

## 2021-08-24 ENCOUNTER — Ambulatory Visit: Payer: Federal, State, Local not specified - PPO | Admitting: Internal Medicine

## 2021-08-24 ENCOUNTER — Telehealth: Payer: Self-pay | Admitting: Internal Medicine

## 2021-08-24 ENCOUNTER — Other Ambulatory Visit: Payer: Self-pay

## 2021-08-24 ENCOUNTER — Inpatient Hospital Stay: Payer: Federal, State, Local not specified - PPO | Admitting: Internal Medicine

## 2021-08-24 VITALS — BP 119/83 | HR 85 | Temp 97.9°F | Resp 18 | Ht 76.0 in | Wt 194.6 lb

## 2021-08-24 DIAGNOSIS — C771 Secondary and unspecified malignant neoplasm of intrathoracic lymph nodes: Secondary | ICD-10-CM | POA: Diagnosis not present

## 2021-08-24 DIAGNOSIS — C3431 Malignant neoplasm of lower lobe, right bronchus or lung: Secondary | ICD-10-CM | POA: Diagnosis not present

## 2021-08-24 DIAGNOSIS — Z79899 Other long term (current) drug therapy: Secondary | ICD-10-CM | POA: Diagnosis not present

## 2021-08-24 DIAGNOSIS — C3491 Malignant neoplasm of unspecified part of right bronchus or lung: Secondary | ICD-10-CM

## 2021-08-24 DIAGNOSIS — C3490 Malignant neoplasm of unspecified part of unspecified bronchus or lung: Secondary | ICD-10-CM | POA: Diagnosis not present

## 2021-08-24 NOTE — Telephone Encounter (Signed)
Scheduled appt per 1/11 los - mailed letter with appt date and time

## 2021-08-24 NOTE — Progress Notes (Signed)
[place] Telephone:(336) [phone]   Fax:(336) [phone]  OFFICE PROGRESS NOTE  [name], L.[name], [place] [address]  [place] [address]  DIAGNOSIS: stage IIIb (T2b, N3, M0) non-small cell lung cancer, adenocarcinoma presented with right lower lobe lung nodule in addition to right hilar mass/node as well as ipsilateral and contralateral mediastinal lymphadenopathy diagnosed in October 2020.  Molecular studies by Guard[employer] 360 showed no actionable mutations.  PRIOR THERAPY:  1) Concurrent chemoradiation with weekly carboplatin for AUC of 2 and paclitaxel 45 mg/M2.  Status post 7 cycles.  Last dose was given July 21, 2019. 2) Consolidation treatment with immunotherapy with Imfinzi 1500 mg.  Status post 13 cycles.  Last dose was given August 19, 2020.  CURRENT THERAPY: Observation.  INTERVAL HISTORY: Alan [name] 66 y.o. male returns to the clinic today for follow-up visit.  The patient is feeling fine today with no concerning complaints except for low back pain and sciatica-like symptoms.  He will see an orthopedic surgeon for evaluation soon.  He denied having any chest pain, shortness of breath, cough or hemoptysis.  He denied having any fever or chills.  He has no nausea, vomiting, diarrhea or constipation.  He has no headache or visual changes.  He had repeat CT scan of the chest performed recently and he is here for evaluation and discussion of his scan results.   MEDICAL HISTORY: Past Medical History:  Diagnosis Date   Adenocarcinoma of right lung, stage 3 ([place]) dx'd 03/2019   CAD S/P percutaneous coronary angioplasty 2004   (Ant STEMI) - Prox LAD 100% => 2 overlapping 3.5 x 1.8 mm Cypher DES stents.;  Patent as of August 2015   Dyslipidemia, goal LDL below 70    Essential hypertension    GERD (gastroesophageal reflux disease)    History of Mitral valve prolapse    Moderate - with moderate MR, noted February t 2013   History of Severe  mitral regurgitation by prior echocardiogram 02/06/2014   TEE: Severe mitral regurgitation with a flail P2 segment and ruptured; Normal LV size & function, dilated LA.   Incidental lung nodule, greater than or equal to 37mm 03/16/2014   Ground glass opacity RML noted on CT scan   PONV (postoperative nausea and vomiting)    S/P Minimally Invasive MVR (mitral valve repair) 04/08/2014   Complex valvuloplasty including quadrangular resection of flail segment of posterior leaflet, sliding leaflet plasty, chordal transfer x1, Gore-tex neocord placement x4 and 34 mm Sorin Memo 3D rechord ring annuloplasty via right mini thoracotomy approach   ST elevation myocardial infarction (STEMI) of anterior wall, subsequent episode of care ([place]) 2004   he had a proximal LAD occlusion treated with 2 overlapping 3.5 x 1.8 mm Cypher DES stents.    ALLERGIES:  is allergic to a-cillin [ampicillin], amoxicillin, other, penicillins, sulfa antibiotics, and crestor [rosuvastatin].  MEDICATIONS:  Current Outpatient Medications  Medication Sig Dispense Refill   Ascorbic Acid (VITAMIN C) 1000 MG tablet Take 2,000 mg by mouth daily.      atorvastatin (LIPITOR) 10 MG tablet Take 1 tablet (10 mg total) by mouth daily. 30 tablet 11   Coenzyme Q10 (CO Q 10) 100 MG CAPS Take 100 mg by mouth daily.      Hypromellose (HYDROXYPROPYL METHYLCELLULOSE) POWD      magnesium 30 MG tablet Take 1 tablet by mouth at bedtime.     Melatonin 10 MG TABS Take 10 mg by mouth at bedtime.  Specialty Vitamins Products (PROSTATE PO) Take 1 capsule by mouth daily. PROSTATE FORMULA     vitamin B-12 (CYANOCOBALAMIN) 1000 MCG tablet Take 1,000 mcg by mouth daily.     Vitamin D-Vitamin K (D3 + K2 DOTS PO) Take 1 tablet by mouth daily.     No current facility-administered medications for this visit.    SURGICAL HISTORY:  Past Surgical History:  Procedure Laterality Date   ANTERIOR CRUCIATE LIGAMENT REPAIR Left 1993   COLONOSCOPY     INTRAOPERATIVE  TRANSESOPHAGEAL ECHOCARDIOGRAM N/A 04/08/2014   Procedure: INTRAOPERATIVE TRANSESOPHAGEAL ECHOCARDIOGRAM;  Surgeon: Rexene Alberts, MD;  Location: [place];  Service: Open Heart Surgery;  Laterality: N/A;   LEFT AND RIGHT HEART CATHETERIZATION WITH CORONARY ANGIOGRAM N/A 03/05/2014   Procedure: LEFT AND RIGHT HEART CATHETERIZATION WITH CORONARY ANGIOGRAM;  Surgeon: Leonie Man, MD;  Location: [facility] CATH LAB;  Service: Cardiovascular; (pre-op) Widely patent LAD stents, 40% distal LAD, 30% Cx, ~50% PL-PDA bifurcation lesion    LEFT HEART CATH AND CORONARY ANGIOGRAPHY  2004   In setting of anterior STEMI, found to have 100% % proximal LAD (2 DES Cypher stents)   LEFT HEART CATH AND CORONARY ANGIOGRAPHY  2007   Widely patent LAD stents, 40% distal LAD, 30% Cx, ~50% PL-PDA bifurcation lesion    LEFT HEART CATHETERIZATION WITH CORONARY ANGIOGRAM N/A 09/15/2011   Procedure: LEFT HEART CATHETERIZATION WITH CORONARY ANGIOGRAM;  Surgeon: Lorretta Harp, MD;  Location: [facility] CATH LAB;  Service: Cardiovascular;  Laterality: N/A;   MITRAL VALVE REPAIR Right 04/08/2014   Procedure: MINIMALLY INVASIVE MITRAL VALVE REPAIR (MVR);  Surgeon: Rexene Alberts, MD;  Location: [place];  Service: Open Heart Surgery;  Laterality: Right;   NM MYOVIEW LTD  May 2011   Walk 9 minutes, and 10 METs, diaphragmatic attenuation but no ischemia or infarction.   SVT ABLATION N/A 10/17/2019   Procedure: SVT ABLATION;  Surgeon: Constance Haw, MD;  Location: [place] CV LAB;  Service: Cardiovascular;  Laterality: N/A;   TEE WITHOUT CARDIOVERSION N/A 02/06/2014   Procedure: TRANSESOPHAGEAL ECHOCARDIOGRAM (TEE);  Surgeon: Pixie Casino, MD;  Normal LV Size U& function - EF 55-60%, no regional WMA.  MV P2 Leaflet is flail with ruptured chord with severe prolapse, anterior leaflet intact.  Severe, eccentric anterior directed MR with dilated LA.   TRANSTHORACIC ECHOCARDIOGRAM  07/18/2018   Normal LV size and function.  EF 50-60 %.  Unable  to assess diastolic function.  Mitral valve sewing ring in place.  Mild stenosis with a gradient of 6 mmHg noted. -   TRANSTHORACIC ECHOCARDIOGRAM  06/30/2015   Low normal LV function with EF 50-55%. GR 1 DD. No MR. Normal gradient of 4 mmHg the mitral valve. No prolapse.   VIDEO BRONCHOSCOPY WITH ENDOBRONCHIAL ULTRASOUND N/A 05/16/2019   Procedure: VIDEO BRONCHOSCOPY WITH ENDOBRONCHIAL ULTRASOUND;  Surgeon: Marshell Garfinkel, MD;  Location: [place];  Service: Pulmonary;  Laterality: N/A;    REVIEW OF SYSTEMS:  A comprehensive review of systems was negative except for: Musculoskeletal: positive for back pain   PHYSICAL EXAMINATION: General appearance: alert, cooperative, and no distress Head: Normocephalic, without obvious abnormality, atraumatic Neck: no adenopathy, no JVD, supple, symmetrical, trachea midline, and thyroid not enlarged, symmetric, no tenderness/mass/nodules Lymph nodes: Cervical, supraclavicular, and axillary nodes normal. Resp: clear to auscultation bilaterally Back: symmetric, no curvature. ROM normal. No CVA tenderness. Cardio: regular rate and rhythm, S1, S2 normal, no murmur, click, rub or gallop GI: soft, non-tender; bowel sounds normal; no  masses,  no organomegaly Extremities: extremities normal, atraumatic, no cyanosis or edema  ECOG PERFORMANCE STATUS: 1 - Symptomatic but completely ambulatory  Blood pressure 119/83, pulse 85, temperature 97.9 F (36.6 C), temperature source Temporal, resp. rate 18, height 6\' 4"  (1.93 m), weight 194 lb 9.6 oz (88.3 kg), SpO2 100 %.  LABORATORY DATA: Lab Results  Component Value Date   WBC 4.7 08/19/2021   HGB 14.2 08/19/2021   HCT 41.3 08/19/2021   MCV 93.9 08/19/2021   PLT 189 08/19/2021      Chemistry      Component Value Date/Time   NA 141 08/19/2021 1421   NA 141 02/25/2020 0915   K 4.0 08/19/2021 1421   CL 105 08/19/2021 1421   CO2 29 08/19/2021 1421   BUN 15 08/19/2021 1421   BUN 14 02/25/2020 0915   CREATININE  0.78 08/19/2021 1421   CREATININE 0.80 02/27/2014 1152      Component Value Date/Time   CALCIUM 9.7 08/19/2021 1421   ALKPHOS 84 08/19/2021 1421   AST 22 08/19/2021 1421   ALT 18 08/19/2021 1421   BILITOT 0.9 08/19/2021 1421       RADIOGRAPHIC STUDIES: CT Chest W Contrast  Result Date: 08/20/2021 CLINICAL DATA:  66 year old male with history of non-small cell lung cancer. EXAM: CT CHEST WITH CONTRAST TECHNIQUE: Multidetector CT imaging of the chest was performed during intravenous contrast administration. CONTRAST:  45mL OMNIPAQUE IOHEXOL 350 MG/ML SOLN COMPARISON:  Chest CT 04/22/2021. FINDINGS: Cardiovascular: Heart size is normal. There is no significant pericardial fluid, thickening or pericardial calcification. There is aortic atherosclerosis, as well as atherosclerosis of the great vessels of the mediastinum and the coronary arteries, including calcified atherosclerotic plaque in the left main, left anterior descending, left circumflex and right coronary arteries. Status post mitral annuloplasty. Mediastinum/Nodes: No pathologically enlarged mediastinal or hilar lymph nodes. Esophagus is unremarkable in appearance. No axillary lymphadenopathy. Lungs/Pleura: Chronic areas of volume loss and architectural distortion are noted in the perihilar aspect of the right mid to upper lung, compatible with chronic postradiation mass-like fibrosis. This is very similar to the prior examination. No definite new suspicious appearing pulmonary nodules or masses are noted. No acute consolidative airspace disease. No pleural effusions. Upper Abdomen: Aortic atherosclerosis. Musculoskeletal: There are no aggressive appearing lytic or blastic lesions noted in the visualized portions of the skeleton. IMPRESSION: 1. Stable examination with chronic postradiation changes in the right lung, but no findings to suggest locally recurrent disease or definite metastatic disease in the thorax. 2. Aortic atherosclerosis, in  addition to left main and three-vessel coronary artery disease. Please note that although the presence of coronary artery calcium documents the presence of coronary artery disease, the severity of this disease and any potential stenosis cannot be assessed on this non-gated CT examination. Assessment for potential risk factor modification, dietary therapy or pharmacologic therapy may be warranted, if clinically indicated. 3. Additional incidental findings, as above. Aortic Atherosclerosis (ICD10-I70.0). Electronically Signed   By: Vinnie Langton M.D.   On: 08/20/2021 20:49     ASSESSMENT AND PLAN: This is a very pleasant 66 years old white male recently diagnosed with a stage IIIb non-small cell lung cancer, adenocarcinoma with no actionable mutation.  He completed a course of concurrent chemoradiation with weekly carboplatin and paclitaxel status post 7 cycles.   His treatment was complicated with dysphagia, odynophagia secondary to radiation induced esophagitis.  He also has persistent cough and shortness of breath secondary to radiation-induced pneumonitis. He has partial response  after this treatment. The patient completed treatment with consolidation immunotherapy with Imfinzi 1500 mg IV every 4 weeks, status post 13 cycles.  Last dose was in January 2022. The patient tolerated his previous treatment fairly well with no concerning adverse effects. The patient is currently on observation and he is feeling fine with no concerning complaints except for the low back pain highly suspicious for sciatica.  He will have evaluation by his orthopedic surgery soon. He had repeat CT scan of the chest performed recently.  I personally and independently reviewed the scan and discussed the result with the patient today. His scan showed no concerning findings for disease recurrence or metastasis. I recommended for him to continue on observation with repeat CT scan of the chest in 6 months. The patient was advised  to call immediately if he has any other concerning symptoms in the interval. The patient voices understanding of current disease status and treatment options and is in agreement with the current care plan. All questions were answered. The patient knows to call the clinic with any problems, questions or concerns. We can certainly see the patient much sooner if necessary.  Disclaimer: This note was dictated with voice recognition software. Similar sounding words can inadvertently be transcribed and may not be corrected upon review.

## 2021-09-28 ENCOUNTER — Ambulatory Visit (HOSPITAL_COMMUNITY): Payer: Federal, State, Local not specified - PPO | Attending: Internal Medicine

## 2021-09-28 ENCOUNTER — Other Ambulatory Visit: Payer: Self-pay

## 2021-09-28 DIAGNOSIS — Z9861 Coronary angioplasty status: Secondary | ICD-10-CM | POA: Diagnosis not present

## 2021-09-28 DIAGNOSIS — I341 Nonrheumatic mitral (valve) prolapse: Secondary | ICD-10-CM

## 2021-09-28 DIAGNOSIS — I2510 Atherosclerotic heart disease of native coronary artery without angina pectoris: Secondary | ICD-10-CM | POA: Diagnosis not present

## 2021-09-28 LAB — ECHOCARDIOGRAM COMPLETE
Area-P 1/2: 4.01 cm2
MV VTI: 1.87 cm2
S' Lateral: 2.9 cm

## 2021-09-30 ENCOUNTER — Telehealth: Payer: Self-pay | Admitting: *Deleted

## 2021-09-30 HISTORY — PX: TRANSTHORACIC ECHOCARDIOGRAM: SHX275

## 2021-09-30 NOTE — Telephone Encounter (Signed)
Called left detail message of echo report on patient voicemail . Left message to call back to schedule an annual appointment. Any question may call back

## 2021-09-30 NOTE — Telephone Encounter (Signed)
-----   Message from Leonie Man, MD sent at 09/30/2021  1:04 PM EST ----- Echocardiogram result shows stable low normal ejection fraction of 50 to 55% with no wall motion abnormalities.  Mitral valve looks like it is working well.  No significant regurgitation.  Aortic valve looks good.  The ascending aorta seems mildly dilated at 38 mm but for your height probably normal.  Overall normal/stable echo.  Glenetta Hew, MD

## 2021-10-10 ENCOUNTER — Ambulatory Visit: Payer: Federal, State, Local not specified - PPO | Admitting: Cardiology

## 2021-10-10 ENCOUNTER — Encounter: Payer: Self-pay | Admitting: Cardiology

## 2021-10-10 ENCOUNTER — Other Ambulatory Visit: Payer: Self-pay

## 2021-10-10 VITALS — BP 132/82 | HR 89 | Ht 76.0 in | Wt 189.6 lb

## 2021-10-10 DIAGNOSIS — I2510 Atherosclerotic heart disease of native coronary artery without angina pectoris: Secondary | ICD-10-CM | POA: Diagnosis not present

## 2021-10-10 DIAGNOSIS — Z9889 Other specified postprocedural states: Secondary | ICD-10-CM | POA: Diagnosis not present

## 2021-10-10 DIAGNOSIS — Z9861 Coronary angioplasty status: Secondary | ICD-10-CM

## 2021-10-10 DIAGNOSIS — I471 Supraventricular tachycardia: Secondary | ICD-10-CM

## 2021-10-10 DIAGNOSIS — I341 Nonrheumatic mitral (valve) prolapse: Secondary | ICD-10-CM | POA: Diagnosis not present

## 2021-10-10 DIAGNOSIS — E785 Hyperlipidemia, unspecified: Secondary | ICD-10-CM

## 2021-10-10 DIAGNOSIS — I10 Essential (primary) hypertension: Secondary | ICD-10-CM

## 2021-10-10 NOTE — Progress Notes (Unsigned)
Primary Care Provider: Alroy [name], Alan Sa, MD Cardiologist: Alan Hew, MD Electrophysiologist: None  Clinic Note: No chief complaint on file.   ===================================  ASSESSMENT/PLAN   Problem List Items Addressed This Visit   None   ===================================  HPI:    Alan [name] is a 66 y.o. 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  He is being seen on a routine basis at the request of Alan [name].  2004: proximal LAD occlusion treated with 2 overlapping 3.5 x 1.8 mm Cypher DES stents. Reluctant to take medications, especially statins. 04/08/2014: MINIMALLY INVASIVE MITRAL VALVE REPAIR (MVR);  Surgeon: Alan Alberts, MD;   Preop cath: Widely patent LAD stents.  40% distal LAD, 30% LCx, ~50% bifurcation PL- PDA 10/17/2019-: Comprehensive EPS involving coronary sinus pacing, mapping of SVT and RFA ablation of SVT -> with no inducible arrhythmia following ablation August 2020: Diagnosis of lung adenocarcinoma  Alan [name] was last seen on October 06, 2020 =?>  Refer to CVRR for lipid management, 2D echo check.  today for follow-up well.  He said that he is pretty happy that he is finished his chemotherapy and radiation therapy for his lung cancer, but he is now doing immunotherapy.  That makes it feel somewhat tired.  He still 90 and drink as well as he could.  He did not tolerate taking Crestor at all.  About 3-4 doses he just could not think lots of different symptoms including myalgias arthralgias etc.   He is now try to do more exercise and he says that about 90%.  His energy level is getting better he is enjoying the spin class, he is not yet doing as much other exercise.  As such, he is little bit out of shape realizes that his heart rate at rest is higher than it usually is.  He also been having issues with orthostatic  dizziness.  A lot of times is because he crosses his legs when he sits.   He really is not having any cardiac symptoms to speak of other than energy level improving, and some orthostatic dizziness..  Recent Hospitalizations: ***  Reviewed  CV studies:    The following studies were reviewed today: (if available, images/films reviewed: From Epic Chart or Care Everywhere) TTE 09/30/2021:: Stable findings.  EF 50 to 55%.  No RWMA.  Mitral valve stable.  No significant MR.  Normal aortic valve.  Mild aortic dilation, but probably Alan [name] for age.  Interval History:   Alan [name]   CV Review of Symptoms (Summary) Cardiovascular ROS: {roscv:310661}  Cardiovascular ROS: no chest pain or dyspnea on exertion positive for - Mild orthostatic dizziness negative for - edema, irregular heartbeat, orthopnea, paroxysmal nocturnal dyspnea, rapid heart rate, shortness of breath or Lightheadedness or dizziness, syncope/near syncope or TIA/amaurosis fugax, claudication  REVIEWED OF SYSTEMS   ROS  Constitutional: Positive for malaise/fatigue (Slowly getting back to about 90%). Negative for weight loss.  HENT: Negative for congestion and nosebleeds.   Respiratory: Positive for cough (Still off on). Negative for shortness of breath.   Gastrointestinal: Negative for blood in stool and melena.  Genitourinary: Negative for hematuria.  Musculoskeletal: Positive for joint pain (Normal MSK pains) and myalgias (When he tried to take Crestor).  Neurological: Negative for dizziness (Only orthostatic  I have reviewed and (if needed) personally updated the patient's problem list, medications, allergies, past medical and surgical  history, social and family history.   PAST MEDICAL HISTORY   Past Medical History:  Diagnosis Date   Adenocarcinoma of right lung, stage 3 ([place]) dx'd 03/2019   CAD S/P percutaneous coronary angioplasty 2004   (Ant STEMI) - Prox LAD 100% => 2 overlapping 3.5 x 1.8 mm Cypher DES  stents.;  Patent as of August 2015   Dyslipidemia, goal LDL below 70    Essential hypertension    GERD (gastroesophageal reflux disease)    History of Mitral valve prolapse    Moderate - with moderate MR, noted February t 2013   History of Severe mitral regurgitation by prior echocardiogram 02/06/2014   TEE: Severe mitral regurgitation with a flail P2 segment and ruptured; Normal LV size & function, dilated LA.   Incidental lung nodule, greater than or equal to 82mm 03/16/2014   Ground glass opacity RML noted on CT scan   PONV (postoperative nausea and vomiting)    S/P Minimally Invasive MVR (mitral valve repair) 04/08/2014   Complex valvuloplasty including quadrangular resection of flail segment of posterior leaflet, sliding leaflet plasty, chordal transfer x1, Gore-tex neocord placement x4 and 34 mm Sorin Memo 3D rechord ring annuloplasty via right mini thoracotomy approach   ST elevation myocardial infarction (STEMI) of anterior wall, subsequent episode of care ([place]) 2004   he had a proximal LAD occlusion treated with 2 overlapping 3.5 x 1.8 mm Cypher DES stents.    PAST SURGICAL HISTORY   Past Surgical History:  Procedure Laterality Date   ANTERIOR CRUCIATE LIGAMENT REPAIR Left 1993   COLONOSCOPY     INTRAOPERATIVE TRANSESOPHAGEAL ECHOCARDIOGRAM N/A 04/08/2014   Procedure: INTRAOPERATIVE TRANSESOPHAGEAL ECHOCARDIOGRAM;  Surgeon: Alan Alberts, MD;  Location: [place];  Service: Open Heart Surgery;  Laterality: N/A;   LEFT AND RIGHT HEART CATHETERIZATION WITH CORONARY ANGIOGRAM N/A 03/05/2014   Procedure: LEFT AND RIGHT HEART CATHETERIZATION WITH CORONARY ANGIOGRAM;  Surgeon: Leonie Man, MD;  Location[facility] CATH LAB;  Service: Cardiovascular; (pre-op) Widely patent LAD stents, 40% distal LAD, 30% Cx, ~50% PL-PDA bifurcation lesion    LEFT HEART CATH AND CORONARY ANGIOGRAPHY  2004   In setting of anterior STEMI, found to have 100% % proximal LAD (2 DES Cypher stents)   LEFT HEART CATH AND  CORONARY ANGIOGRAPHY  2007   Widely patent LAD stents, 40% distal LAD, 30% Cx, ~50% PL-PDA bifurcation lesion    LEFT HEART CATHETERIZATION WITH CORONARY ANGIOGRAM N/A 09/15/2011   Procedure: LEFT HEART CATHETERIZATION WITH CORONARY ANGIOGRAM;  Surgeon: Lorretta Harp, MD;  Location: [facility] CATH LAB;  Service: Cardiovascular;  Laterality: N/A;   MITRAL VALVE REPAIR Right 04/08/2014   Procedure: MINIMALLY INVASIVE MITRAL VALVE REPAIR (MVR);  Surgeon: Alan Alberts, MD;  Location: [place];  Service: Open Heart Surgery;  Laterality: Right;   NM MYOVIEW LTD  May 2011   Walk 9 minutes, and 10 METs, diaphragmatic attenuation but no ischemia or infarction.   SVT ABLATION N/A 10/17/2019   Procedure: SVT ABLATION;  Surgeon: Constance Haw, MD;  Location: [place] CV LAB;  Service: Cardiovascular;  Laterality: N/A;   TEE WITHOUT CARDIOVERSION N/A 02/06/2014   Procedure: TRANSESOPHAGEAL ECHOCARDIOGRAM (TEE);  Surgeon: Pixie Casino, MD;  Normal LV Size U& function - EF 55-60%, no regional WMA.  MV P2 Leaflet is flail with ruptured chord with severe prolapse, anterior leaflet intact.  Severe, eccentric anterior directed MR with dilated LA.   TRANSTHORACIC ECHOCARDIOGRAM  07/18/2018   Normal LV size and function.  EF 50-60 %.  Unable to assess diastolic function.  Mitral valve sewing ring in place.  Mild stenosis with a gradient of 6 mmHg noted. -   TRANSTHORACIC ECHOCARDIOGRAM  06/30/2015   Low normal LV function with EF 50-55%. GR 1 DD. No MR. Normal gradient of 4 mmHg the mitral valve. No prolapse.   VIDEO BRONCHOSCOPY WITH ENDOBRONCHIAL ULTRASOUND N/A 05/16/2019   Procedure: VIDEO BRONCHOSCOPY WITH ENDOBRONCHIAL ULTRASOUND;  Surgeon: Marshell Garfinkel, MD;  Location: [place];  Service: Pulmonary;  Laterality: N/A;    Immunization History  Administered Date(s) Administered   Td 05/23/2018   Tdap 01/14/2007    MEDICATIONS/ALLERGIES   Current Meds  Medication Sig   Ascorbic Acid (VITAMIN C) 1000 MG  tablet Take 2,000 mg by mouth daily.    atorvastatin (LIPITOR) 10 MG tablet Take 1 tablet (10 mg total) by mouth daily.   Coenzyme Q10 (CO Q 10) 100 MG CAPS Take 100 mg by mouth daily.    Hypromellose (HYDROXYPROPYL METHYLCELLULOSE) POWD    magnesium 30 MG tablet Take 1 tablet by mouth at bedtime.   MEDROL 4 MG tablet Take by mouth as directed.   Melatonin 10 MG TABS Take 10 mg by mouth at bedtime.   Specialty Vitamins Products (PROSTATE PO) Take 1 capsule by mouth daily. PROSTATE FORMULA   vitamin B-12 (CYANOCOBALAMIN) 1000 MCG tablet Take 1,000 mcg by mouth daily.   Vitamin D-Vitamin K (D3 + K2 DOTS PO) Take 1 tablet by mouth daily.    Allergies  Allergen Reactions   A-Cillin [Ampicillin] Shortness Of Breath, Swelling and Rash    Did it involve swelling of the face/tongue/throat, SOB, or low BP? Yes Did it involve sudden or severe rash/hives, skin peeling, or any reaction on the inside of your mouth or nose? Yes Did you need to seek medical attention at a hospital or doctor's office? Yes When did it last happen? ~20 years ago If all above answers are "NO", may proceed with cephalosporin use.     Amoxicillin Hives, Shortness Of Breath and Swelling    Did it involve swelling of the face/tongue/throat, SOB, or low BP? Yes Did it involve sudden or severe rash/hives, skin peeling, or any reaction on the inside of your mouth or nose? Yes Did you need to seek medical attention at a hospital or doctor's office? Yes When did it last happen? ~20 years ago If all above answers are "NO", may proceed with cephalosporin use.    Other Hives, Shortness Of Breath and Swelling    ALL "CILLINS"   Penicillins Hives, Shortness Of Breath and Swelling    Did it involve swelling of the face/tongue/throat, SOB, or low BP? Yes Did it involve sudden or severe rash/hives, skin peeling, or any reaction on the inside of your mouth or nose? Yes Did you need to seek medical attention at a hospital or doctor's  office? Yes When did it last happen? ~20 years ago If all above answers are "NO", may proceed with cephalosporin use.     Sulfa Antibiotics Nausea Only   Crestor [Rosuvastatin] Other (See Comments)    G I upset    SOCIAL HISTORY/FAMILY HISTORY   Reviewed in Epic:  Pertinent findings:  Social History   Tobacco Use   Smoking status: Never   Smokeless tobacco: Never  Vaping Use   Vaping Use: Never used  Substance Use Topics   Alcohol use: Yes    Alcohol/week: 10.0 standard drinks    Types: 5 Cans of  beer, 5 Shots of liquor per week    Comment: social   Drug use: No   Social History   Social History Narrative   He is a married father of one. Exercises avidly as noted above - runs routinely at least 3 miles 3-4 times a day.  He drinks his Xango fruit juce - 32 Oz. Daily.     Never smoked and only takes occasional alcohol    OBJCTIVE -PE, EKG, labs   Wt Readings from Last 3 Encounters:  10/10/21 189 lb 9.6 oz (86 kg)  08/24/21 194 lb 9.6 oz (88.3 kg)  04/25/21 189 lb 11.2 oz (86 kg)    Physical Exam: BP 132/82    Pulse 89    Ht 6\' 4"  (1.93 m)    Wt 189 lb 9.6 oz (86 kg)    SpO2 99%    BMI 23.08 kg/m  Physical Exam Soft HSM at base   Adult ECG Report  Rate: *** ;  Rhythm: {rhythm:17366};   Narrative Interpretation: ***  Recent Labs:  ***  Lab Results  Component Value Date   CHOL 160 12/07/2020   HDL 50 12/07/2020   LDLCALC 95 12/07/2020   TRIG 78 12/07/2020   CHOLHDL 3.2 12/07/2020   Lab Results  Component Value Date   CREATININE 0.78 08/19/2021   BUN 15 08/19/2021   NA 141 08/19/2021   K 4.0 08/19/2021   CL 105 08/19/2021   CO2 29 08/19/2021   CBC Latest Ref Rng & Units 08/19/2021 04/22/2021 12/17/2020  WBC 4.0 - 10.5 K/uL 4.7 5.0 3.5(L)  Hemoglobin 13.0 - 17.0 g/dL 14.2 14.7 15.3  Hematocrit 39.0 - 52.0 % 41.3 43.0 45.4  Platelets 150 - 400 K/uL 189 175 148(L)    Lab Results  Component Value Date   HGBA1C 5.4 04/06/2014   Lab Results  Component  Value Date   TSH 1.928 08/19/2020    ==================================================  COVID-19 Education: The signs and symptoms of COVID-19 were discussed with the patient and how to seek care for testing (follow up with PCP or arrange E-visit).    I spent a total of ***minutes with the patient spent in direct patient consultation.  Additional time spent with chart review  / charting (studies, outside notes, etc): *** min Total Time: *** min  Current medicines are reviewed at length with the patient today.  (+/- concerns) ***  This visit occurred during the SARS-CoV-2 public health emergency.  Safety protocols were in place, including screening questions prior to the visit, additional usage of staff PPE, and extensive cleaning of exam room while observing appropriate contact time as indicated for disinfecting solutions.  Notice: This dictation was prepared with Dragon dictation along with smart phrase technology. Any transcriptional errors that result from this process are unintentional and may not be corrected upon review.  Studies Ordered:   No orders of the defined types were placed in this encounter.   Patient Instructions / Medication Changes & Studies & Tests Ordered   There are no Patient Instructions on file for this visit.     Alan [name], M.D., M.S. Interventional Cardiologist   Pager # [phone] Phone # [phone] [address] [place], [place] [address]   Thank you for choosing Heart[facility] at [facility]!!

## 2021-10-10 NOTE — Patient Instructions (Addendum)
Medication Instructions:   No changes   *If you need a refill on your cardiac medications before your next appointment, please call your pharmacy*   Lab Work:  Not needed  ( please send a copy of  recent labs to the cardiology office.)    Testing/Procedures: Not needed   Follow-Up: At [facility], you and your health needs are our priority.  As part of our continuing mission to provide you with exceptional heart care, we have created designated Provider Care Teams.  These Care Teams include your primary Cardiologist (physician) and Advanced Practice Providers (APPs -  Physician Assistants and Nurse Practitioners) who all work together to provide you with the care you need, when you need it.     Your next appointment:   12 month(s)  The format for your next appointment:   In Person  Provider:   Glenetta Hew, MD

## 2021-10-19 ENCOUNTER — Telehealth: Payer: Self-pay

## 2021-10-19 NOTE — Telephone Encounter (Signed)
Pt called wanting to ensure Dr. Julien Nordmann has and/or will speak with Dr. Zachery Dakins (Ortho Surgeon) regarding his plan to address his spinal lesion. Pt states Dr. Zachery Dakins stated he was going to personally contact Dr. Julien Nordmann. ?

## 2021-10-27 ENCOUNTER — Other Ambulatory Visit: Payer: Self-pay | Admitting: Internal Medicine

## 2021-10-27 NOTE — Progress Notes (Signed)
I received a call from Mr. Rennaker orthopedic surgeon regarding recent MRI of the lumbar spine that showed some lytic lesions in the iliac spine concerning for metastatic disease but they were not hypermetabolic on the previous PET scan performed 2 years ago.  I will discussion as to consider repeating PET scan again to rule out the presence of any suspicious bone metastasis in this area.  If the PET scan is hypermetabolic, we would consider CT-guided core biopsy of 1 of these lesions for confirmation of the diagnosis and referral to oncology Ortho if needed.  I will place the order for the PET scan. ?

## 2021-11-11 ENCOUNTER — Ambulatory Visit (HOSPITAL_COMMUNITY)
Admission: RE | Admit: 2021-11-11 | Discharge: 2021-11-11 | Disposition: A | Discharge status: Home / Self Care | Payer: Federal, State, Local not specified - PPO | Source: Ambulatory Visit | Attending: Internal Medicine | Admitting: Internal Medicine

## 2021-11-11 DIAGNOSIS — C3490 Malignant neoplasm of unspecified part of unspecified bronchus or lung: Secondary | ICD-10-CM | POA: Diagnosis present

## 2021-11-11 LAB — GLUCOSE, CAPILLARY: Glucose-Capillary: 107 mg/dL — ABNORMAL HIGH (ref 70–99)

## 2021-11-11 MED ORDER — FLUDEOXYGLUCOSE F - 18 (FDG) INJECTION
10.0000 | Freq: Once | INTRAVENOUS | Status: AC
  Administered 2021-11-11: 10 via INTRAVENOUS

## 2021-11-13 ENCOUNTER — Encounter: Payer: Self-pay | Admitting: Cardiology

## 2021-11-13 NOTE — Assessment & Plan Note (Signed)
Doing well since ablation.  He has little fluttering sensations every now and then but nothing significant.  Did not do well on beta-blocker.  Therefore would simply monitoring. ?We reviewed vagal maneuvers.  Hopefully no further episodes occur. ?Maintaining adequate hydration. ?

## 2021-11-13 NOTE — Assessment & Plan Note (Signed)
Borderline blood pressure today.  He is really reluctant to take any medications.  Usually when he is able to be as active as he has been, his pressures are well controlled.  Continue to monitor.  He would not want to take medications. ?

## 2021-11-13 NOTE — Assessment & Plan Note (Signed)
LDL definitely showed some improvement on  3 days a week low-dose atorvastatin, still not at goal. ? ?He does not want to go back to CV[facility], is not interested in doing injection medications.  Pretty much does not want to have infusions or injections ever since his cancer treatment. ?He understands the risk involved with not adequately treating his lipids. ?

## 2021-11-13 NOTE — Assessment & Plan Note (Signed)
Valve seems to be holding up fairly well.  Due for follow-up echocardiogram in roughly 2 to 3 years. ?

## 2021-11-13 NOTE — Assessment & Plan Note (Signed)
Follow-up echo shows normal functioning valve.  No significant MR or MS.  Aortic valve looks fine. ?Follow-up in 2 to 3 years. ?

## 2021-11-13 NOTE — Assessment & Plan Note (Signed)
Distant history of MI and LAD PCI.  Stents were fully open in 2015. ?He is usually very active when not bothered by his back pain.  He has tolerated multiple procedures and treatments for lung cancer without any anginal symptoms. ?He is very averse to take any medications and therefore is not on anything besides atorvastatin 10 mg. ? ?Did not tolerate ACE inhibitor or beta-blocker in the past. ? ?He takes a lot of NSAIDs at home and therefore is not taking aspirin. ?Only willing to take atorvastatin 3 to 4 days a week.  I asked that he try to take an additional day. ?

## 2021-11-14 ENCOUNTER — Encounter: Payer: Self-pay | Admitting: *Deleted

## 2021-11-14 ENCOUNTER — Telehealth: Payer: Self-pay

## 2021-11-14 ENCOUNTER — Inpatient Hospital Stay: Payer: Federal, State, Local not specified - PPO | Attending: Internal Medicine | Admitting: Internal Medicine

## 2021-11-14 ENCOUNTER — Other Ambulatory Visit: Payer: Self-pay

## 2021-11-14 DIAGNOSIS — C7951 Secondary malignant neoplasm of bone: Secondary | ICD-10-CM | POA: Diagnosis not present

## 2021-11-14 DIAGNOSIS — C3490 Malignant neoplasm of unspecified part of unspecified bronchus or lung: Secondary | ICD-10-CM | POA: Diagnosis not present

## 2021-11-14 DIAGNOSIS — C3491 Malignant neoplasm of unspecified part of right bronchus or lung: Secondary | ICD-10-CM

## 2021-11-14 DIAGNOSIS — C3431 Malignant neoplasm of lower lobe, right bronchus or lung: Secondary | ICD-10-CM | POA: Diagnosis present

## 2021-11-14 NOTE — Progress Notes (Unsigned)
Arne Cleveland, MD  Roosvelt Maser ?Ok  ?CT core PET+ R iliac lesion  ?May need drill  ? ?DDH   ?  ?   ?Previous Messages ?  ?----- Message -----  ?From: Roosvelt Maser  ?Sent: 11/14/2021   3:02 PM EDT  ?To: [facility] Procedure Requests  ?Subject: CT Biopsy                                      ? ? ? ?Ct Biopsy  ? ?CT guided Core biopsy of Hypermetabolic right iliac bone lesion  ? ?Pet in computer  ? ?Dr. Julien Nordmann, [facility]  ?[phone]   ?

## 2021-11-14 NOTE — Progress Notes (Signed)
?    [place] ?Telephone:([phone]) [phone]   [phone] ? ?OFFICE PROGRESS NOTE ? ?Mitchell, L.[name], MD ?[address] ?[place] [address] ? ?DIAGNOSIS: Suspicious metastatic non-small cell lung cancer initially diagnosed as stage IIIb (T2b, N3, M0) non-small cell lung cancer, adenocarcinoma presented with right lower lobe lung nodule in addition to right hilar mass/node as well as ipsilateral and contralateral mediastinal lymphadenopathy diagnosed in October 2020.  The patient has evidence of bone metastases on a PET scan in March 2023. ? ?Molecular studies by [employer] showed no actionable mutations. ? ?PRIOR THERAPY:  ?1) Concurrent chemoradiation with weekly carboplatin for AUC of 2 and paclitaxel 45 mg/M2.  Status post 7 cycles.  Last dose was given July 21, 2019. ?2) Consolidation treatment with immunotherapy with Imfinzi 1500 mg.  Status post 13 cycles.  Last dose was given August 19, 2020. ? ?CURRENT THERAPY: Observation. ? ?INTERVAL HISTORY: ?KENNTH VANBENSCHOTEN 66 y.o. male returns to the clinic today for follow-up visit accompanied by his wife.  The patient continues to complain of low back pain that has been going on for several months now.  He was seen by physical therapy and chiropractor as well as orthopedic surgeon.  He had MRI of the lumbar spine performed in February 2023 that showed suspicious iliac bone sclerotic lesions.  I recommended for the patient to have a PET scan for further evaluation of this lesion and to rule out any other metastatic disease.  This was performed recently and the patient is here today for evaluation and discussion of his PET scan results and further recommendation regarding his condition.  He denied having any current chest pain, shortness of breath but has mild cough with no hemoptysis.  He has no nausea, vomiting, diarrhea or constipation.  He denied having any headache or visual changes.  He has no recent weight loss or  night sweats. ?  ?MEDICAL HISTORY: ?Past Medical History:  ?Diagnosis Date  ? Adenocarcinoma of right lung, stage 3 ([place]) dx'd 03/2019  ? CAD S/P percutaneous coronary angioplasty 2004  ? (Ant STEMI) - Prox LAD 100% => 2 overlapping 3.5 x 1.8 mm Cypher DES stents.;  Patent as of August 2015  ? Dyslipidemia, goal LDL below 70   ? Essential hypertension   ? GERD (gastroesophageal reflux disease)   ? History of Mitral valve prolapse   ? Moderate - with moderate MR, noted February t 2013  ? History of Severe mitral regurgitation by prior echocardiogram 02/06/2014  ? TEE: Severe mitral regurgitation with a flail P2 segment and ruptured; Normal LV size & function, dilated LA.  ? Incidental lung nodule, greater than or equal to 9mm 03/16/2014  ? Ground glass opacity RML noted on CT scan  ? PONV (postoperative nausea and vomiting)   ? S/P Minimally Invasive MVR (mitral valve repair) 04/08/2014  ? Complex valvuloplasty including quadrangular resection of flail segment of posterior leaflet, sliding leaflet plasty, chordal transfer x1, Gore-tex neocord placement x4 and 34 mm Sorin Memo 3D rechord ring annuloplasty via right mini thoracotomy approach  ? ST elevation myocardial infarction (STEMI) of anterior wall, subsequent episode of care [facility]) 2004  ? he had a proximal LAD occlusion treated with 2 overlapping 3.5 x 1.8 mm Cypher DES stents.  ? ? ?ALLERGIES:  is allergic to a-cillin [ampicillin], amoxicillin, other, penicillins, sulfa antibiotics, and crestor [rosuvastatin]. ? ?MEDICATIONS:  ?Current Outpatient Medications  ?Medication Sig Dispense Refill  ? Ascorbic Acid (VITAMIN C)  1000 MG tablet Take 2,000 mg by mouth daily.     ? atorvastatin (LIPITOR) 10 MG tablet Take 1 tablet (10 mg total) by mouth daily. 30 tablet 11  ? Coenzyme Q10 (CO Q 10) 100 MG CAPS Take 100 mg by mouth daily.     ? Hypromellose (HYDROXYPROPYL METHYLCELLULOSE) POWD     ? magnesium 30 MG tablet Take 1 tablet by mouth at bedtime.    ? MEDROL 4 MG tablet  Take by mouth as directed.    ? Melatonin 10 MG TABS Take 10 mg by mouth at bedtime.    ? Specialty Vitamins Products (PROSTATE PO) Take 1 capsule by mouth daily. PROSTATE FORMULA    ? vitamin B-12 (CYANOCOBALAMIN) 1000 MCG tablet Take 1,000 mcg by mouth daily.    ? Vitamin D-Vitamin K (D3 + K2 DOTS PO) Take 1 tablet by mouth daily.    ? ?No current facility-administered medications for this visit.  ? ? ?SURGICAL HISTORY:  ?Past Surgical History:  ?Procedure Laterality Date  ? ANTERIOR CRUCIATE LIGAMENT REPAIR Left 1993  ? COLONOSCOPY    ? INTRAOPERATIVE TRANSESOPHAGEAL ECHOCARDIOGRAM N/A 04/08/2014  ? Procedure: INTRAOPERATIVE TRANSESOPHAGEAL ECHOCARDIOGRAM;  Surgeon: Rexene Alberts, MD;  Location: [facility];  Service: Open Heart Surgery;  Laterality: N/A;  ? LEFT AND RIGHT HEART CATHETERIZATION WITH CORONARY ANGIOGRAM N/A 03/05/2014  ? Procedure: LEFT AND RIGHT HEART CATHETERIZATION WITH CORONARY ANGIOGRAM;  Surgeon: Leonie Man, MD;  Location: [facility] CATH LAB;  Service: Cardiovascular; (pre-op) Widely patent LAD stents, 40% distal LAD, 30% Cx, ~50% PL-PDA bifurcation lesion   ? LEFT HEART CATH AND CORONARY ANGIOGRAPHY  2004  ? In setting of anterior STEMI, found to have 100% % proximal LAD (2 DES Cypher stents)  ? LEFT HEART CATH AND CORONARY ANGIOGRAPHY  2007  ? Widely patent LAD stents, 40% distal LAD, 30% Cx, ~50% PL-PDA bifurcation lesion   ? LEFT HEART CATHETERIZATION WITH CORONARY ANGIOGRAM N/A 09/15/2011  ? Procedure: LEFT HEART CATHETERIZATION WITH CORONARY ANGIOGRAM;  Surgeon: Lorretta Harp, MD;  Location: [facility] CATH LAB;  Service: Cardiovascular;  Laterality: N/A;  ? MITRAL VALVE REPAIR Right 04/08/2014  ? Procedure: MINIMALLY INVASIVE MITRAL VALVE REPAIR (MVR);  Surgeon: Rexene Alberts, MD;  Location: [place];  Service: Open Heart Surgery;  Laterality: Right;  ? NM MYOVIEW LTD  12/2009  ? Walk 9 minutes, and 10 METs, diaphragmatic attenuation but no ischemia or infarction.  ? SVT ABLATION N/A 10/17/2019  ?  Procedure: SVT ABLATION;  Surgeon: Constance Haw, MD;  Location: [facility] CV LAB;  Service: Cardiovascular;  Laterality: N/A;  ? TEE WITHOUT CARDIOVERSION N/A 02/06/2014  ? Procedure: TRANSESOPHAGEAL ECHOCARDIOGRAM (TEE);  Surgeon: Pixie Casino, MD;  Normal LV Size U& function - EF 55-60%, no regional WMA.  MV P2 Leaflet is flail with ruptured chord with severe prolapse, anterior leaflet intact.  Severe, eccentric anterior directed MR with dilated LA.  ? TRANSTHORACIC ECHOCARDIOGRAM  07/18/2018  ? Normal LV size and function.  EF 50-60 %.  Unable to assess diastolic function.  Mitral valve sewing ring in place.  Mild stenosis with a gradient of 6 mmHg noted. -  ? TRANSTHORACIC ECHOCARDIOGRAM  09/30/2021  ? Stable findings.  EF 50 to 55%.  No RWMA.  Mitral valve stable.  No significant MR.  Normal aortic valve.  Mild aortic dilation, but probably normal for age.  ? VIDEO BRONCHOSCOPY WITH ENDOBRONCHIAL ULTRASOUND N/A 05/16/2019  ? Procedure: VIDEO BRONCHOSCOPY WITH ENDOBRONCHIAL ULTRASOUND;  Surgeon: Marshell Garfinkel, MD;  Location: [place];  Service: Pulmonary;  Laterality: N/A;  ? ? ?REVIEW OF SYSTEMS:  Constitutional: negative ?Eyes: negative ?Ears, nose, mouth, throat, and face: negative ?Respiratory: negative ?Cardiovascular: negative ?Gastrointestinal: negative ?Genitourinary:negative ?Integument/breast: negative ?Hematologic/lymphatic: negative ?Musculoskeletal:positive for back pain ?Neurological: negative ?Behavioral/Psych: negative ?Endocrine: negative ?Allergic/Immunologic: negative  ? ?PHYSICAL EXAMINATION: General appearance: alert, cooperative, and no distress ?Head: Normocephalic, without obvious abnormality, atraumatic ?Neck: no adenopathy, no JVD, supple, symmetrical, trachea midline, and thyroid not enlarged, symmetric, no tenderness/mass/nodules ?Lymph nodes: Cervical, supraclavicular, and axillary nodes normal. ?Resp: clear to auscultation bilaterally ?Back: symmetric, no curvature. ROM  normal. No CVA tenderness. ?Cardio: regular rate and rhythm, S1, S2 normal, no murmur, click, rub or gallop ?GI: soft, non-tender; bowel sounds normal; no masses,  no organomegaly ?Extremities: extremit

## 2021-11-14 NOTE — Telephone Encounter (Signed)
Spoke with pt's wife to request pt come in to speak with Dr. Willette Alma to review PET scan results. He will come in at 1:30 today. ?

## 2021-11-15 NOTE — Progress Notes (Signed)
?Radiation Oncology         [phone] ?________________________________ ? ?Outpatient Re-Consultation ? ?Name: Alan [name] MRN: [id number]  ?Date: 11/16/2021  DOB: [birth year] ? ?WF:UXNATFTD, L.[name], MD  Curt Bears, MD  ? ?REFERRING PHYSICIAN: Curt Bears, MD ? ?DIAGNOSIS: The primary encounter diagnosis was Adenocarcinoma of right lung, stage 3 ([place]). A diagnosis of Metastasis to bone [facility]) was also pertinent to this visit. ? ?Stage IIIb (T2b, N3, M0) non-small cell lung cancer, adenocarcinoma diagnosed in 2020: s/p concurrent chemoradiation and immunotherapy. Now with new evidence of bone metastases on a PET scan in March 2023. ? ?HISTORY OF PRESENT ILLNESS::Alan [name] is a 66 y.o. male who is seen as a courtesy of Dr. Julien Nordmann for re-evaluation and an opinion concerning radiation therapy as part of management for his recently diagnosed osseous metastases from lung cancer primary. I last met with with patient on 09/05/19 for post-RT follow-up. Since then, the patient continued with immunotherapy treatment under Dr. Julien Nordmann which was completed on 08/19/20. Since completing immunotherapy, the patient had no evidence of disease progression (on follow-up imaging studies or examinations) until recently. His recent subsequent history is detailed as follows.  ? ?Restaging PET ordered by Dr. Julien Nordmann on 11/11/21 showed new sclerotic hypermetabolic lesions in the proximal left humerus (SUV max 5.1), medial right iliac wing, and sacrum. No other evidence of additional metastatic disease was appreciated. (Prior imaging studies showed no evidence of metastatic disease or disease recurrence).  ? ?During his most recent follow-up visit with Dr. Julien Nordmann on 11/14/21, the patient was noted to report ongoing lower back pain for the last several months, for which he is being followed by PT, a chiropractor, and an orthopedic surgeon. Per Dr. Julien Nordmann, the patient had an MRI of the lumbar spine performed this past  February which first showed suspicious iliac bone lesions (not available on EMR) which prompted the above PET. In terms of respiratory symptoms, the patient denied any CP or SOB but did report mild cough without hemoptysis. For his new osseous metastases, Dr. Julien Nordmann accordingly referred the patient to me for consideration of palliative radiotherapy to the metastatic bone lesions in the right iliac bone, as well as the left humerus. ? ?PREVIOUS RADIATION THERAPY:  ? ?Radiation Treatment Dates: 06/09/2019 through 07/22/2019 ?Site Technique Total Dose (Gy) Dose per Fx (Gy) Completed Fx Beam Energies  ?Lung, Right: Lung_Rt 3D 60/60 2 30/30 6X, 15X  ? ?OTHER PRIOR THERAPY:  ? ?1) Concurrent chemoradiation with weekly carboplatin for AUC of 2 and paclitaxel 45 mg/M2.  Status post 7 cycles.  Last dose was given July 21, 2019. ?2) Consolidation treatment with immunotherapy with Imfinzi 1500 mg.  Status post 13 cycles.  Last dose was given August 19, 2020. ? ? ?PAST MEDICAL HISTORY:  ?Past Medical History:  ?Diagnosis Date  ? Adenocarcinoma of right lung, stage 3 ([place]) dx'd 03/2019  ? CAD S/P percutaneous coronary angioplasty 2004  ? (Ant STEMI) - Prox LAD 100% => 2 overlapping 3.5 x 1.8 mm Cypher DES stents.;  Patent as of August 2015  ? Dyslipidemia, goal LDL below 70   ? Essential hypertension   ? GERD (gastroesophageal reflux disease)   ? History of Mitral valve prolapse   ? Moderate - with moderate MR, noted February t 2013  ? History of Severe mitral regurgitation by prior echocardiogram 02/06/2014  ? TEE: Severe mitral regurgitation with a flail P2 segment and ruptured; Normal LV size & function, dilated LA.  ? Incidental lung nodule,  greater than or equal to 55m 03/16/2014  ? Ground glass opacity RML noted on CT scan  ? PONV (postoperative nausea and vomiting)   ? S/P Minimally Invasive MVR (mitral valve repair) 04/08/2014  ? Complex valvuloplasty including quadrangular resection of flail segment of posterior leaflet,  sliding leaflet plasty, chordal transfer x1, Gore-tex neocord placement x4 and 34 mm Sorin Memo 3D rechord ring annuloplasty via right mini thoracotomy approach  ? ST elevation myocardial infarction (STEMI) of anterior wall, subsequent episode of care ([facility] 2004  ? he had a proximal LAD occlusion treated with 2 overlapping 3.5 x 1.8 mm Cypher DES stents.  ? ? ?PAST SURGICAL HISTORY: ?Past Surgical History:  ?Procedure Laterality Date  ? ANTERIOR CRUCIATE LIGAMENT REPAIR Left 1993  ? COLONOSCOPY    ? INTRAOPERATIVE TRANSESOPHAGEAL ECHOCARDIOGRAM N/A 04/08/2014  ? Procedure: INTRAOPERATIVE TRANSESOPHAGEAL ECHOCARDIOGRAM;  Surgeon: CRexene Alberts MD;  [place]  Service: Open Heart Surgery;  Laterality: N/A;  ? LEFT AND RIGHT HEART CATHETERIZATION WITH CORONARY ANGIOGRAM N/A 03/05/2014  ? Procedure: LEFT AND RIGHT HEART CATHETERIZATION WITH CORONARY ANGIOGRAM;  Surgeon: DLeonie Man MD;  [facility];  Service: Cardiovascular; (pre-op) Widely patent LAD stents, 40% distal LAD, 30% Cx, ~50% PL-PDA bifurcation lesion   ? LEFT HEART CATH AND CORONARY ANGIOGRAPHY  2004  ? In setting of anterior STEMI, found to have 100% % proximal LAD (2 DES Cypher stents)  ? LEFT HEART CATH AND CORONARY ANGIOGRAPHY  2007  ? Widely patent LAD stents, 40% distal LAD, 30% Cx, ~50% PL-PDA bifurcation lesion   ? LEFT HEART CATHETERIZATION WITH CORONARY ANGIOGRAM N/A 09/15/2011  ? Procedure: LEFT HEART CATHETERIZATION WITH CORONARY ANGIOGRAM;  Surgeon: JLorretta Harp MD;  [facility];  Service: Cardiovascular;  Laterality: N/A;  ? MITRAL VALVE REPAIR Right 04/08/2014  ? Procedure: MINIMALLY INVASIVE MITRAL VALVE REPAIR (MVR);  Surgeon: CRexene Alberts MD;  [place]  Service: Open Heart Surgery;  Laterality: Right;  ? NM MYOVIEW LTD  12/2009  ? Walk 9 minutes, and 10 METs, diaphragmatic attenuation but no ischemia or infarction.  ? SVT ABLATION N/A 10/17/2019  ? Procedure: SVT ABLATION;  Surgeon:  CConstance Haw MD;  [facility];  Service: Cardiovascular;  Laterality: N/A;  ? TEE WITHOUT CARDIOVERSION N/A 02/06/2014  ? Procedure: TRANSESOPHAGEAL ECHOCARDIOGRAM (TEE);  Surgeon: KPixie Casino MD;  Normal LV Size U& function - EF 55-60%, no regional WMA.  MV P2 Leaflet is flail with ruptured chord with severe prolapse, anterior leaflet intact.  Severe, eccentric anterior directed MR with dilated LA.  ? TRANSTHORACIC ECHOCARDIOGRAM  07/18/2018  ? Normal LV size and function.  EF 50-60 %.  Unable to assess diastolic function.  Mitral valve sewing ring in place.  Mild stenosis with a gradient of 6 mmHg noted. -  ? TRANSTHORACIC ECHOCARDIOGRAM  09/30/2021  ? Stable findings.  EF 50 to 55%.  No RWMA.  Mitral valve stable.  No significant MR.  Normal aortic valve.  Mild aortic dilation, but probably normal for age.  ? VIDEO BRONCHOSCOPY WITH ENDOBRONCHIAL ULTRASOUND N/A 05/16/2019  ? Procedure: VIDEO BRONCHOSCOPY WITH ENDOBRONCHIAL ULTRASOUND;  Surgeon: MMarshell Garfinkel MD;  [place]  Service: Pulmonary;  Laterality: N/A;  ? ? ?FAMILY HISTORY:  ?Family History  ?Problem Relation Age of Onset  ? Hypertension Mother   ? Heart Problems Father   ?     triple bypass 1989  ? Cancer Paternal Grandmother   ?  Pancreatic  ? ? ?SOCIAL HISTORY:  ?Social History  ? ?Tobacco Use  ? Smoking status: Never  ? Smokeless tobacco: Never  ?Vaping Use  ? Vaping Use: Never used  ?Substance Use Topics  ? Alcohol use: Yes  ?  Alcohol/week: 10.0 standard drinks  ?  Types: 5 Cans of beer, 5 Shots of liquor per week  ?  Comment: social  ? Drug use: No  ? ? ?ALLERGIES:  ?Allergies  ?Allergen Reactions  ? A-Cillin [Ampicillin] Shortness Of Breath, Swelling and Rash  ?  Did it involve swelling of the face/tongue/throat, SOB, or low BP? Yes ?Did it involve sudden or severe rash/hives, skin peeling, or any reaction on the inside of your mouth or nose? Yes ?Did you need to seek medical attention at a hospital or  doctor's office? Yes ?When did it last happen? ~20 years ago ?If all above answers are "NO", may proceed with cephalosporin use. ? ?  ? Amoxicillin Hives, Shortness Of Breath and Swelling  ?  Did it involv

## 2021-11-16 ENCOUNTER — Ambulatory Visit
Admission: RE | Admit: 2021-11-16 | Discharge: 2021-11-16 | Disposition: A | Discharge status: Home / Self Care | Payer: Federal, State, Local not specified - PPO | Source: Ambulatory Visit | Attending: Radiation Oncology | Admitting: Radiation Oncology

## 2021-11-16 ENCOUNTER — Encounter: Payer: Self-pay | Admitting: Radiation Oncology

## 2021-11-16 ENCOUNTER — Other Ambulatory Visit: Payer: Self-pay

## 2021-11-16 VITALS — BP 116/81 | HR 94 | Temp 96.6°F | Resp 18 | Ht 76.0 in | Wt 190.0 lb

## 2021-11-16 DIAGNOSIS — I252 Old myocardial infarction: Secondary | ICD-10-CM | POA: Insufficient documentation

## 2021-11-16 DIAGNOSIS — K219 Gastro-esophageal reflux disease without esophagitis: Secondary | ICD-10-CM | POA: Insufficient documentation

## 2021-11-16 DIAGNOSIS — G893 Neoplasm related pain (acute) (chronic): Secondary | ICD-10-CM | POA: Diagnosis not present

## 2021-11-16 DIAGNOSIS — Z800 Family history of malignant neoplasm of digestive organs: Secondary | ICD-10-CM | POA: Diagnosis not present

## 2021-11-16 DIAGNOSIS — I10 Essential (primary) hypertension: Secondary | ICD-10-CM | POA: Diagnosis not present

## 2021-11-16 DIAGNOSIS — I700 Atherosclerosis of aorta: Secondary | ICD-10-CM | POA: Insufficient documentation

## 2021-11-16 DIAGNOSIS — C7951 Secondary malignant neoplasm of bone: Secondary | ICD-10-CM | POA: Diagnosis present

## 2021-11-16 DIAGNOSIS — E785 Hyperlipidemia, unspecified: Secondary | ICD-10-CM | POA: Diagnosis not present

## 2021-11-16 DIAGNOSIS — Z79899 Other long term (current) drug therapy: Secondary | ICD-10-CM | POA: Diagnosis not present

## 2021-11-16 DIAGNOSIS — C3431 Malignant neoplasm of lower lobe, right bronchus or lung: Secondary | ICD-10-CM | POA: Insufficient documentation

## 2021-11-16 DIAGNOSIS — C3491 Malignant neoplasm of unspecified part of right bronchus or lung: Secondary | ICD-10-CM

## 2021-11-16 DIAGNOSIS — I2510 Atherosclerotic heart disease of native coronary artery without angina pectoris: Secondary | ICD-10-CM | POA: Insufficient documentation

## 2021-11-16 NOTE — Progress Notes (Signed)
Histology and Location of Primary Cancer: right lung ? ?Sites of Visceral and Bony Metastatic Disease: lumbar spine, right iliac bone, left humerus ? ?Location(s) of Symptomatic Metastases: lumbar spine ? ?Past/Anticipated chemotherapy by medical oncology, if any: PRIOR THERAPY:  ?1) Concurrent chemoradiation with weekly carboplatin for AUC of 2 and paclitaxel 45 mg/M2.  Status post 7 cycles.  Last dose was given July 21, 2019. ?2) Consolidation treatment with immunotherapy with Imfinzi 1500 mg.  Status post 13 cycles.  Last dose was given August 19, 2020. ?  ?CURRENT THERAPY: Observation. ? ?Pain on a scale of 0-10 is: 4 low back pain ? ? ?If Spine Met(s), symptoms, if any, include: ?Bowel/Bladder retention or incontinence (please describe): denies ?Numbness or weakness in extremities (please describe): denies ?Current Decadron regimen, if applicable: denies ? ?Ambulatory status? Walker? Wheelchair?: Ambulatory ? ?SAFETY ISSUES: ?Prior radiation? Right lung completed on 07/22/2019 ?Pacemaker/ICD? no ?Possible current pregnancy? no ?Is the patient on methotrexate? no ? ?Current Complaints / other details:  low back pain ? ?Vitals:  ? 11/16/21 1518  ?BP: 116/81  ?Pulse: 94  ?Resp: 18  ?Temp: (!) 96.6 ?F (35.9 ?C)  ?TempSrc: Temporal  ?SpO2: 98%  ?Weight: 190 lb (86.2 kg)  ?Height: _0  (1.93 m)  ? ? ? ?

## 2021-11-16 NOTE — Progress Notes (Signed)
See MD note for nursing evaluation. °

## 2021-11-24 ENCOUNTER — Ambulatory Visit
Admission: RE | Admit: 2021-11-24 | Discharge: 2021-11-24 | Disposition: A | Discharge status: Home / Self Care | Payer: Medicare Other | Source: Ambulatory Visit | Attending: Radiation Oncology | Admitting: Radiation Oncology

## 2021-11-24 ENCOUNTER — Other Ambulatory Visit: Payer: Self-pay

## 2021-11-24 DIAGNOSIS — Z510 Encounter for antineoplastic radiation therapy: Secondary | ICD-10-CM | POA: Insufficient documentation

## 2021-11-24 DIAGNOSIS — C7951 Secondary malignant neoplasm of bone: Secondary | ICD-10-CM | POA: Diagnosis not present

## 2021-11-24 DIAGNOSIS — C3431 Malignant neoplasm of lower lobe, right bronchus or lung: Secondary | ICD-10-CM | POA: Insufficient documentation

## 2021-11-25 ENCOUNTER — Other Ambulatory Visit: Payer: Self-pay | Admitting: Radiology

## 2021-11-28 ENCOUNTER — Ambulatory Visit (HOSPITAL_COMMUNITY)
Admission: RE | Admit: 2021-11-28 | Discharge: 2021-11-28 | Disposition: A | Discharge status: Home / Self Care | Payer: Federal, State, Local not specified - PPO | Source: Ambulatory Visit | Attending: Internal Medicine | Admitting: Internal Medicine

## 2021-11-28 ENCOUNTER — Encounter (HOSPITAL_COMMUNITY): Payer: Self-pay

## 2021-11-28 DIAGNOSIS — C3490 Malignant neoplasm of unspecified part of unspecified bronchus or lung: Secondary | ICD-10-CM | POA: Diagnosis present

## 2021-11-28 DIAGNOSIS — Z8679 Personal history of other diseases of the circulatory system: Secondary | ICD-10-CM | POA: Diagnosis not present

## 2021-11-28 DIAGNOSIS — Z85118 Personal history of other malignant neoplasm of bronchus and lung: Secondary | ICD-10-CM | POA: Insufficient documentation

## 2021-11-28 DIAGNOSIS — C7951 Secondary malignant neoplasm of bone: Secondary | ICD-10-CM | POA: Insufficient documentation

## 2021-11-28 DIAGNOSIS — I252 Old myocardial infarction: Secondary | ICD-10-CM | POA: Insufficient documentation

## 2021-11-28 DIAGNOSIS — E785 Hyperlipidemia, unspecified: Secondary | ICD-10-CM | POA: Insufficient documentation

## 2021-11-28 DIAGNOSIS — K219 Gastro-esophageal reflux disease without esophagitis: Secondary | ICD-10-CM | POA: Diagnosis not present

## 2021-11-28 DIAGNOSIS — I10 Essential (primary) hypertension: Secondary | ICD-10-CM | POA: Diagnosis not present

## 2021-11-28 LAB — CBC WITH DIFFERENTIAL/PLATELET
Abs Immature Granulocytes: 0.02 10*3/uL (ref 0.00–0.07)
Basophils Absolute: 0 10*3/uL (ref 0.0–0.1)
Basophils Relative: 0 %
Eosinophils Absolute: 0.2 10*3/uL (ref 0.0–0.5)
Eosinophils Relative: 3 %
HCT: 41.6 % (ref 39.0–52.0)
Hemoglobin: 14.3 g/dL (ref 13.0–17.0)
Immature Granulocytes: 0 %
Lymphocytes Relative: 16 %
Lymphs Abs: 0.8 10*3/uL (ref 0.7–4.0)
MCH: 32.8 pg (ref 26.0–34.0)
MCHC: 34.4 g/dL (ref 30.0–36.0)
MCV: 95.4 fL (ref 80.0–100.0)
Monocytes Absolute: 0.5 10*3/uL (ref 0.1–1.0)
Monocytes Relative: 10 %
Neutro Abs: 3.3 10*3/uL (ref 1.7–7.7)
Neutrophils Relative %: 71 %
Platelets: 183 10*3/uL (ref 150–400)
RBC: 4.36 MIL/uL (ref 4.22–5.81)
RDW: 12.3 % (ref 11.5–15.5)
WBC: 4.7 10*3/uL (ref 4.0–10.5)
nRBC: 0 % (ref 0.0–0.2)

## 2021-11-28 LAB — BASIC METABOLIC PANEL
Anion gap: 8 (ref 5–15)
BUN: 14 mg/dL (ref 8–23)
CO2: 24 mmol/L (ref 22–32)
Calcium: 9.4 mg/dL (ref 8.9–10.3)
Chloride: 109 mmol/L (ref 98–111)
Creatinine, Ser: 0.64 mg/dL (ref 0.61–1.24)
GFR, Estimated: >60 mL/min (ref >60)
Glucose, Bld: 104 mg/dL — ABNORMAL HIGH (ref 70–99)
Potassium: 3.9 mmol/L (ref 3.5–5.1)
Sodium: 141 mmol/L (ref 135–145)

## 2021-11-28 LAB — PROTIME-INR
INR: 1 (ref 0.8–1.2)
Prothrombin Time: 12.9 seconds (ref 11.4–15.2)

## 2021-11-28 MED ORDER — LIDOCAINE HCL (PF) 1 % IJ SOLN
INTRAMUSCULAR | Status: AC | PRN
Start: 2021-11-28 — End: 2021-11-28
  Administered 2021-11-28: 10 mL via INTRADERMAL

## 2021-11-28 MED ORDER — FLUMAZENIL 0.5 MG/5ML IV SOLN
INTRAVENOUS | Status: AC
  Filled 2021-11-28: qty 5

## 2021-11-28 MED ORDER — SODIUM CHLORIDE 0.9 % IV SOLN: INTRAVENOUS | Status: DC

## 2021-11-28 MED ORDER — FENTANYL CITRATE (PF) 100 MCG/2ML IJ SOLN
INTRAMUSCULAR | Status: AC | PRN
  Administered 2021-11-28: 50 ug via INTRAVENOUS

## 2021-11-28 MED ORDER — HYDROCODONE-ACETAMINOPHEN 5-325 MG PO TABS: 1.0000 | ORAL_TABLET | ORAL | Status: DC | PRN

## 2021-11-28 MED ORDER — NALOXONE HCL 0.4 MG/ML IJ SOLN
INTRAMUSCULAR | Status: AC
  Filled 2021-11-28: qty 1

## 2021-11-28 MED ORDER — FENTANYL CITRATE (PF) 100 MCG/2ML IJ SOLN
INTRAMUSCULAR | Status: AC
  Filled 2021-11-28: qty 2

## 2021-11-28 MED ORDER — MIDAZOLAM HCL 2 MG/2ML IJ SOLN
INTRAMUSCULAR | Status: AC | PRN
  Administered 2021-11-28: 1 mg via INTRAVENOUS

## 2021-11-28 MED ORDER — MIDAZOLAM HCL 2 MG/2ML IJ SOLN
INTRAMUSCULAR | Status: AC
  Filled 2021-11-28: qty 4

## 2021-11-28 NOTE — Procedures (Signed)
?  Procedure:  CT core R iliac bone lesion biopsy   ?Preprocedure diagnosis: The encounter diagnosis was Malignant neoplasm of unspecified part of unspecified bronchus or lung ([place]). ? ?Postprocedure diagnosis: same ?EBL:    minimal ?Complications:   none immediate ? ?See full dictation in [facility]. ? ?D. Arne Cleveland MD ?Main # [phone] ?Pager  [phone] ?Mobile [phone] ?  ? ?

## 2021-11-28 NOTE — Consult Note (Signed)
? ?Chief Complaint: ?Patient was seen in consultation today for image guided right iliac bone lesion biopsy ? ?Referring Physician(s): ?Mohamed,Mohamed ? ?Supervising Physician: Arne Cleveland ? ?Patient Status: [place] ? ?History of Present Illness: ?Alan [name] is a 66 y.o. male with past medical history of coronary artery disease with prior MI/angioplasty/stenting, hyperlipidemia, hypertension, GERD, mitral valve prolapse, mitral regurgitation, and mitral valve repair.  He also has a history of non-small cell lung cancer-adenocarcinoma initially diagnosed as stage IIIb and presented with right lower lobe lung nodule in addition to right hilar mass/node as well as ipsilateral contralateral mediastinal lymphadenopathy in October 2020.  He now has evidence of bony mets on PET scan in March of this year.  He presents today for image guided right iliac bone lesion biopsy for further evaluation. ? ?Past Medical History:  ?Diagnosis Date  ? Adenocarcinoma of right lung, stage 3 ([place]) dx'd 03/2019  ? CAD S/P percutaneous coronary angioplasty 2004  ? (Ant STEMI) - Prox LAD 100% => 2 overlapping 3.5 x 1.8 mm Cypher DES stents.;  Patent as of August 2015  ? Dyslipidemia, goal LDL below 70   ? Essential hypertension   ? GERD (gastroesophageal reflux disease)   ? History of Mitral valve prolapse   ? Moderate - with moderate MR, noted February t 2013  ? History of Severe mitral regurgitation by prior echocardiogram 02/06/2014  ? TEE: Severe mitral regurgitation with a flail P2 segment and ruptured; Normal LV size & function, dilated LA.  ? Incidental lung nodule, greater than or equal to 10mm 03/16/2014  ? Ground glass opacity RML noted on CT scan  ? PONV (postoperative nausea and vomiting)   ? S/P Minimally Invasive MVR (mitral valve repair) 04/08/2014  ? Complex valvuloplasty including quadrangular resection of flail segment of posterior leaflet, sliding leaflet plasty, chordal transfer x1, Gore-tex neocord  placement x4 and 34 mm Sorin Memo 3D rechord ring annuloplasty via right mini thoracotomy approach  ? ST elevation myocardial infarction (STEMI) of anterior wall, subsequent episode of care [facility]) 2004  ? he had a proximal LAD occlusion treated with 2 overlapping 3.5 x 1.8 mm Cypher DES stents.  ? ? ?Past Surgical History:  ?Procedure Laterality Date  ? ANTERIOR CRUCIATE LIGAMENT REPAIR Left 1993  ? COLONOSCOPY    ? INTRAOPERATIVE TRANSESOPHAGEAL ECHOCARDIOGRAM N/A 04/08/2014  ? Procedure: INTRAOPERATIVE TRANSESOPHAGEAL ECHOCARDIOGRAM;  Surgeon: Rexene Alberts, MD;  Location: [place];  Service: Open Heart Surgery;  Laterality: N/A;  ? LEFT AND RIGHT HEART CATHETERIZATION WITH CORONARY ANGIOGRAM N/A 03/05/2014  ? Procedure: LEFT AND RIGHT HEART CATHETERIZATION WITH CORONARY ANGIOGRAM;  Surgeon: Leonie Man, MD;  Location: [facility] CATH LAB;  Service: Cardiovascular; (pre-op) Widely patent LAD stents, 40% distal LAD, 30% Cx, ~50% PL-PDA bifurcation lesion   ? LEFT HEART CATH AND CORONARY ANGIOGRAPHY  2004  ? In setting of anterior STEMI, found to have 100% % proximal LAD (2 DES Cypher stents)  ? LEFT HEART CATH AND CORONARY ANGIOGRAPHY  2007  ? Widely patent LAD stents, 40% distal LAD, 30% Cx, ~50% PL-PDA bifurcation lesion   ? LEFT HEART CATHETERIZATION WITH CORONARY ANGIOGRAM N/A 09/15/2011  ? Procedure: LEFT HEART CATHETERIZATION WITH CORONARY ANGIOGRAM;  Surgeon: Lorretta Harp, MD;  Location: [facility] CATH LAB;  Service: Cardiovascular;  Laterality: N/A;  ? MITRAL VALVE REPAIR Right 04/08/2014  ? Procedure: MINIMALLY INVASIVE MITRAL VALVE REPAIR (MVR);  Surgeon: Rexene Alberts, MD;  Location: [place];  Service: Open Heart Surgery;  Laterality: Right;  ? NM MYOVIEW LTD  12/2009  ? Walk 9 minutes, and 10 METs, diaphragmatic attenuation but no ischemia or infarction.  ? SVT ABLATION N/A 10/17/2019  ? Procedure: SVT ABLATION;  Surgeon: Constance Haw, MD;  Location: [place] CV LAB;  Service: Cardiovascular;   Laterality: N/A;  ? TEE WITHOUT CARDIOVERSION N/A 02/06/2014  ? Procedure: TRANSESOPHAGEAL ECHOCARDIOGRAM (TEE);  Surgeon: Pixie Casino, MD;  Normal LV Size U& function - EF 55-60%, no regional WMA.  MV P2 Leaflet is flail with ruptured chord with severe prolapse, anterior leaflet intact.  Severe, eccentric anterior directed MR with dilated LA.  ? TRANSTHORACIC ECHOCARDIOGRAM  07/18/2018  ? Normal LV size and function.  EF 50-60 %.  Unable to assess diastolic function.  Mitral valve sewing ring in place.  Mild stenosis with a gradient of 6 mmHg noted. -  ? TRANSTHORACIC ECHOCARDIOGRAM  09/30/2021  ? Stable findings.  EF 50 to 55%.  No RWMA.  Mitral valve stable.  No significant MR.  Normal aortic valve.  Mild aortic dilation, but probably normal for age.  ? VIDEO BRONCHOSCOPY WITH ENDOBRONCHIAL ULTRASOUND N/A 05/16/2019  ? Procedure: VIDEO BRONCHOSCOPY WITH ENDOBRONCHIAL ULTRASOUND;  Surgeon: Marshell Garfinkel, MD;  Location: [place];  Service: Pulmonary;  Laterality: N/A;  ? ? ?Allergies: ?A-cillin [ampicillin], Amoxicillin, Other, Penicillins, Sulfa antibiotics, and Crestor [rosuvastatin] ? ?Medications: ?Prior to Admission medications   ?Medication Sig Start Date End Date Taking? Authorizing Provider  ?Ascorbic Acid (VITAMIN C) 1000 MG tablet Take 2,000 mg by mouth daily.     [provider]  ?atorvastatin (LIPITOR) 10 MG tablet Take 1 tablet (10 mg total) by mouth daily. 10/06/20 11/16/21  Leonie Man, MD  ?Coenzyme Q10 (CO Q 10) 100 MG CAPS Take 100 mg by mouth daily.     [provider]  ?HYDROcodone-acetaminophen (NORCO/VICODIN) 5-325 MG tablet Take 1 tablet by mouth 3 (three) times daily as needed. 11/09/21   [provider]  ?Hypromellose (HYDROXYPROPYL METHYLCELLULOSE) POWD  07/02/20   [provider]  ?magnesium 30 MG tablet Take 1 tablet by mouth at bedtime.    [provider]  ?MEDROL 4 MG tablet Take by mouth as directed. ?Patient not taking: Reported on  11/16/2021 09/29/21   [provider]  ?Melatonin 10 MG TABS Take 10 mg by mouth at bedtime.    [provider]  ?Specialty Vitamins Products (PROSTATE PO) Take 1 capsule by mouth daily. PROSTATE FORMULA    [provider]  ?vitamin B-12 (CYANOCOBALAMIN) 1000 MCG tablet Take 1,000 mcg by mouth daily.    [provider]  ?Vitamin D-Vitamin K (D3 + K2 DOTS PO) Take 1 tablet by mouth daily.    [provider]  ?  ? ?Family History  ?Problem Relation Age of Onset  ? Hypertension Mother   ? Heart Problems Father   ?     triple bypass 1989  ? Cancer Paternal Grandmother   ?     Pancreatic  ? ? ?Social History  ? ?Socioeconomic History  ? Marital status: Married  ?  Spouse name: Not on file  ? Number of children: Not on file  ? Years of education: Not on file  ? Highest education level: Not on file  ?Occupational History  ? Not on file  ?Tobacco Use  ? Smoking status: Never  ? Smokeless tobacco: Never  ?Vaping Use  ? Vaping Use: Never used  ?Substance and Sexual Activity  ? Alcohol  use: Yes  ?  Alcohol/week: 10.0 standard drinks  ?  Types: 5 Cans of beer, 5 Shots of liquor per week  ?  Comment: social  ? Drug use: No  ? Sexual activity: Not on file  ?Other Topics Concern  ? Not on file  ?Social History Narrative  ? He is a married father of one. Exercises avidly as noted above - runs routinely at least 3 miles 3-4 times a day.  He drinks his Xango fruit juce - 32 Oz. Daily.    ? Never smoked and only takes occasional alcohol  ? ?Social Determinants of Health  ? ?Financial Resource Strain: Not on file  ?Food Insecurity: Not on file  ?Transportation Needs: Not on file  ?Physical Activity: Not on file  ?Stress: Not on file  ?Social Connections: Not on file  ? ? ? ? ?Review of Systems denies fever, headache, chest pain, dyspnea, cough, abdominal pain, nausea, vomiting or bleeding.  Does have back pain and left shoulder pain ? ?Vital Signs: ?Vitals:  ? 11/28/21 0930  ?BP: (!) 124/95   ?Pulse: 79  ?Resp: 18  ?Temp: 97.7 ?F (36.5 ?C)  ?SpO2: 100%  ? ? ? ? ? ?Physical Exam awake, alert.  Chest clear to auscultation bilaterally.  Heart with regular rate and rhythm.  Abdomen soft, positive bowel sounds, n

## 2021-11-30 DIAGNOSIS — Z510 Encounter for antineoplastic radiation therapy: Secondary | ICD-10-CM | POA: Diagnosis not present

## 2021-12-02 LAB — SURGICAL PATHOLOGY

## 2021-12-05 ENCOUNTER — Ambulatory Visit
Admission: RE | Admit: 2021-12-05 | Discharge: 2021-12-05 | Disposition: A | Discharge status: Home / Self Care | Payer: Medicare Other | Source: Ambulatory Visit | Attending: Radiation Oncology | Admitting: Radiation Oncology

## 2021-12-05 ENCOUNTER — Other Ambulatory Visit: Payer: Self-pay

## 2021-12-05 DIAGNOSIS — Z510 Encounter for antineoplastic radiation therapy: Secondary | ICD-10-CM | POA: Diagnosis not present

## 2021-12-05 LAB — RAD ONC ARIA SESSION SUMMARY

## 2021-12-06 ENCOUNTER — Ambulatory Visit
Admission: RE | Admit: 2021-12-06 | Discharge: 2021-12-06 | Disposition: A | Discharge status: Home / Self Care | Payer: Medicare Other | Source: Ambulatory Visit | Attending: Radiation Oncology | Admitting: Radiation Oncology

## 2021-12-06 ENCOUNTER — Other Ambulatory Visit: Payer: Self-pay

## 2021-12-06 DIAGNOSIS — Z510 Encounter for antineoplastic radiation therapy: Secondary | ICD-10-CM | POA: Diagnosis not present

## 2021-12-06 LAB — RAD ONC ARIA SESSION SUMMARY

## 2021-12-07 ENCOUNTER — Telehealth: Payer: Self-pay | Admitting: Medical Oncology

## 2021-12-07 ENCOUNTER — Other Ambulatory Visit: Payer: Self-pay

## 2021-12-07 ENCOUNTER — Ambulatory Visit
Admission: RE | Admit: 2021-12-07 | Discharge: 2021-12-07 | Disposition: A | Discharge status: Home / Self Care | Payer: Medicare Other | Source: Ambulatory Visit | Attending: Radiation Oncology | Admitting: Radiation Oncology

## 2021-12-07 DIAGNOSIS — Z510 Encounter for antineoplastic radiation therapy: Secondary | ICD-10-CM | POA: Diagnosis not present

## 2021-12-07 LAB — RAD ONC ARIA SESSION SUMMARY

## 2021-12-07 NOTE — Telephone Encounter (Signed)
Recent pathology- ? ?Pt asking if the pathology is the same as his lung histology? ? ?I LVM for pt that per Dr. Julien Nordmann , his cone bx histopathology is adenocarcinoma which is the same cancer as his lung. ? ?

## 2021-12-08 ENCOUNTER — Other Ambulatory Visit: Payer: Self-pay

## 2021-12-08 ENCOUNTER — Ambulatory Visit
Admission: RE | Admit: 2021-12-08 | Discharge: 2021-12-08 | Disposition: A | Discharge status: Home / Self Care | Payer: Medicare Other | Source: Ambulatory Visit | Attending: Radiation Oncology | Admitting: Radiation Oncology

## 2021-12-08 DIAGNOSIS — Z510 Encounter for antineoplastic radiation therapy: Secondary | ICD-10-CM | POA: Diagnosis not present

## 2021-12-08 LAB — RAD ONC ARIA SESSION SUMMARY

## 2021-12-09 ENCOUNTER — Ambulatory Visit
Admission: RE | Admit: 2021-12-09 | Discharge: 2021-12-09 | Disposition: A | Discharge status: Home / Self Care | Payer: Medicare Other | Source: Ambulatory Visit | Attending: Radiation Oncology | Admitting: Radiation Oncology

## 2021-12-09 ENCOUNTER — Other Ambulatory Visit: Payer: Self-pay

## 2021-12-09 DIAGNOSIS — Z510 Encounter for antineoplastic radiation therapy: Secondary | ICD-10-CM | POA: Diagnosis not present

## 2021-12-09 LAB — RAD ONC ARIA SESSION SUMMARY

## 2021-12-12 ENCOUNTER — Other Ambulatory Visit: Payer: Self-pay

## 2021-12-12 ENCOUNTER — Ambulatory Visit
Admission: RE | Admit: 2021-12-12 | Discharge: 2021-12-12 | Disposition: A | Discharge status: Home / Self Care | Payer: Medicare Other | Source: Ambulatory Visit | Attending: Radiation Oncology | Admitting: Radiation Oncology

## 2021-12-12 DIAGNOSIS — C7951 Secondary malignant neoplasm of bone: Secondary | ICD-10-CM | POA: Insufficient documentation

## 2021-12-12 DIAGNOSIS — C3431 Malignant neoplasm of lower lobe, right bronchus or lung: Secondary | ICD-10-CM | POA: Diagnosis not present

## 2021-12-12 DIAGNOSIS — Z510 Encounter for antineoplastic radiation therapy: Secondary | ICD-10-CM | POA: Insufficient documentation

## 2021-12-12 LAB — RAD ONC ARIA SESSION SUMMARY

## 2021-12-13 ENCOUNTER — Ambulatory Visit
Admission: RE | Admit: 2021-12-13 | Discharge: 2021-12-13 | Disposition: A | Discharge status: Home / Self Care | Payer: Medicare Other | Source: Ambulatory Visit | Attending: Radiation Oncology | Admitting: Radiation Oncology

## 2021-12-13 ENCOUNTER — Other Ambulatory Visit: Payer: Self-pay

## 2021-12-13 DIAGNOSIS — Z510 Encounter for antineoplastic radiation therapy: Secondary | ICD-10-CM | POA: Diagnosis not present

## 2021-12-13 LAB — RAD ONC ARIA SESSION SUMMARY

## 2021-12-14 ENCOUNTER — Ambulatory Visit
Admission: RE | Admit: 2021-12-14 | Discharge: 2021-12-14 | Disposition: A | Discharge status: Home / Self Care | Payer: Medicare Other | Source: Ambulatory Visit | Attending: Radiation Oncology | Admitting: Radiation Oncology

## 2021-12-14 ENCOUNTER — Other Ambulatory Visit: Payer: Self-pay

## 2021-12-14 DIAGNOSIS — Z510 Encounter for antineoplastic radiation therapy: Secondary | ICD-10-CM | POA: Diagnosis not present

## 2021-12-14 LAB — RAD ONC ARIA SESSION SUMMARY
Course Elapsed Days: 9
Plan Fractions Treated to Date: 8
Plan Fractions Treated to Date: 8
Plan Prescribed Dose Per Fraction: 3 Gy
Plan Prescribed Dose Per Fraction: 3 Gy
Plan Total Fractions Prescribed: 10
Plan Total Fractions Prescribed: 10
Plan Total Prescribed Dose: 30 Gy
Plan Total Prescribed Dose: 30 Gy
Reference Point Dosage Given to Date: 24 Gy
Reference Point Dosage Given to Date: 24 Gy
Reference Point Session Dosage Given: 3 Gy
Reference Point Session Dosage Given: 3 Gy
Session Number: 8

## 2021-12-15 ENCOUNTER — Other Ambulatory Visit: Payer: Self-pay

## 2021-12-15 ENCOUNTER — Ambulatory Visit
Admission: RE | Admit: 2021-12-15 | Discharge: 2021-12-15 | Disposition: A | Discharge status: Home / Self Care | Payer: Medicare Other | Source: Ambulatory Visit | Attending: Radiation Oncology | Admitting: Radiation Oncology

## 2021-12-15 DIAGNOSIS — Z510 Encounter for antineoplastic radiation therapy: Secondary | ICD-10-CM | POA: Diagnosis not present

## 2021-12-15 LAB — RAD ONC ARIA SESSION SUMMARY
Course Elapsed Days: 10
Plan Fractions Treated to Date: 9
Plan Fractions Treated to Date: 9
Plan Prescribed Dose Per Fraction: 3 Gy
Plan Prescribed Dose Per Fraction: 3 Gy
Plan Total Fractions Prescribed: 10
Plan Total Fractions Prescribed: 10
Plan Total Prescribed Dose: 30 Gy
Plan Total Prescribed Dose: 30 Gy
Reference Point Dosage Given to Date: 27 Gy
Reference Point Dosage Given to Date: 27 Gy
Reference Point Session Dosage Given: 3 Gy
Reference Point Session Dosage Given: 3 Gy
Session Number: 9

## 2021-12-16 ENCOUNTER — Encounter: Payer: Self-pay | Admitting: Radiation Oncology

## 2021-12-16 ENCOUNTER — Ambulatory Visit
Admission: RE | Admit: 2021-12-16 | Discharge: 2021-12-16 | Disposition: A | Discharge status: Home / Self Care | Payer: Medicare Other | Source: Ambulatory Visit | Attending: Radiation Oncology | Admitting: Radiation Oncology

## 2021-12-16 ENCOUNTER — Other Ambulatory Visit: Payer: Self-pay

## 2021-12-16 DIAGNOSIS — Z510 Encounter for antineoplastic radiation therapy: Secondary | ICD-10-CM | POA: Diagnosis not present

## 2021-12-16 LAB — RAD ONC ARIA SESSION SUMMARY
Course Elapsed Days: 11
Plan Fractions Treated to Date: 10
Plan Fractions Treated to Date: 10
Plan Prescribed Dose Per Fraction: 3 Gy
Plan Prescribed Dose Per Fraction: 3 Gy
Plan Total Fractions Prescribed: 10
Plan Total Fractions Prescribed: 10
Plan Total Prescribed Dose: 30 Gy
Plan Total Prescribed Dose: 30 Gy
Reference Point Dosage Given to Date: 30 Gy
Reference Point Dosage Given to Date: 30 Gy
Reference Point Session Dosage Given: 3 Gy
Reference Point Session Dosage Given: 3 Gy
Session Number: 10

## 2021-12-19 ENCOUNTER — Other Ambulatory Visit: Payer: Self-pay

## 2021-12-19 ENCOUNTER — Telehealth: Payer: Self-pay | Admitting: Cardiology

## 2021-12-19 MED ORDER — ATORVASTATIN CALCIUM 10 MG PO TABS: 10.0000 mg | ORAL_TABLET | Freq: Every day | ORAL | 3 refills | Status: DC

## 2021-12-19 NOTE — Telephone Encounter (Signed)
? ?*  STAT* If patient is at the pharmacy, call can be transferred to refill team. ? ? ?1. Which medications need to be refilled? (please list name of each medication and dose if known)  ? atorvastatin (LIPITOR) 10 MG tablet   ? ? ?2. Which pharmacy/location (including street and city if local pharmacy) is medication to be sent to?[id number] - [place], [address] AT [address] ? ?3. Do they need a 30 day or 90 day supply? 90 days ?

## 2021-12-19 NOTE — Telephone Encounter (Signed)
Medication refilled and sent to desired pharmacy ?

## 2022-01-06 ENCOUNTER — Encounter: Payer: Self-pay | Admitting: Radiation Oncology

## 2022-01-15 NOTE — Progress Notes (Incomplete)
  Radiation Oncology         [phone] ________________________________  Patient Name: Alan [name] MRN: [id number] DOB: Mar 21, 1956 Referring Physician: Curt Bears (Profile Not Attached) Date of Service: 12/16/2021 [facility], [place]                                                        End Of Treatment Note  Diagnoses: C34.31-Malignant neoplasm of lower lobe, right bronchus or lung C79.51-Secondary malignant neoplasm of bone  Cancer Staging: The primary encounter diagnosis was Adenocarcinoma of right lung, stage 3 ([place]). A diagnosis of Metastasis to bone [facility]) was also pertinent to this visit.   Stage IIIb (T2b, N3, M0) non-small cell lung cancer, adenocarcinoma diagnosed in 2020: s/p concurrent chemoradiation and immunotherapy. Now with new evidence of bone metastases on a PET scan in March 2023.  Intent: Palliative  Radiation Treatment Dates: 12/05/2021 through 12/16/2021 Site Technique Total Dose (Gy) Dose per Fx (Gy) Completed Fx Beam Energies  Ilium, Right: Pelvis_R 3D 30/30 3 10/10 15X  Humerus, Left: Ext_L Complex 30/30 3 10/10 10X   Narrative: The patient tolerated radiation therapy relatively well. On the date of his final weekly treatment check, the patient reported improvement in his lower back pan, fatigue, and dizziness. He denied any issues to the left humerus.   Plan: The patient will follow-up with radiation oncology in one month .  ________________________________________________ -----------------------------------  Blair Promise, PhD, MD  This document serves as a record of services personally performed by Gery Pray, MD. It was created on his behalf by Roney Mans, a trained medical scribe. The creation of this record is based on the scribe's personal observations and the provider's statements to them. This document has been checked and approved by the attending provider.

## 2022-01-15 NOTE — Progress Notes (Signed)
Radiation Oncology         [phone] ________________________________  Name: Alan [name] MRN: [id number]  Date: 01/16/2022  DOB: 12-Dec-1955  Follow-Up Visit Note  CC: [name], L.[name], MD  Curt Bears, MD  No diagnosis found.  Diagnosis:  The primary encounter diagnosis was Adenocarcinoma of right lung, stage 3 ([place]). A diagnosis of Metastasis to bone [facility]) was also pertinent to this visit.   Stage IIIb (T2b, N3, M0) non-small cell lung cancer, adenocarcinoma diagnosed in 2020: s/p concurrent chemoradiation and immunotherapy. Now with new evidence of bone metastases on a PET scan in March 2023.  Interval Since Last Radiation: 1 month  Intent: Palliative  Radiation Treatment Dates: 12/05/2021 through 12/16/2021 Site Technique Total Dose (Gy) Dose per Fx (Gy) Completed Fx Beam Energies  Ilium, Right: Pelvis_R 3D 30/30 3 10/10 15X  Humerus, Left: Ext_L Complex 30/30 3 10/10 10X    Radiation Treatment Dates: 06/09/2019 through 07/22/2019 Site Technique Total Dose (Gy) Dose per Fx (Gy) Completed Fx Beam Energies  Lung, Right: Lung_Rt 3D 60/60 2 30/30 6X, 15X    Narrative:  The patient returns today for routine 1 month follow-up. The patient tolerated radiation therapy relatively well. On the date of his final weekly treatment check, the patient reported improvement in his lower back pan, fatigue, and dizziness. He denied any issues to the left humerus.     Since he was seen for re-evaluation, the patient underwent a biopsy of the right iliac bone lesion on 11/28/21. Pathology revealed metastatic carcinoma to the bone, consistent with primary lung adenocarcinoma.           Otherwise, no significant interval history since the patient was last seen.   ***           Allergies:  is allergic to a-cillin [ampicillin], amoxicillin, other, penicillins, sulfa antibiotics, and crestor [rosuvastatin].  Meds: Current Outpatient Medications  Medication Sig Dispense Refill   Ascorbic  Acid (VITAMIN C) 1000 MG tablet Take 2,000 mg by mouth daily.      atorvastatin (LIPITOR) 10 MG tablet Take 1 tablet (10 mg total) by mouth daily. 90 tablet 3   Coenzyme Q10 (CO Q 10) 100 MG CAPS Take 100 mg by mouth daily.      HYDROcodone-acetaminophen (NORCO/VICODIN) 5-325 MG tablet Take 1 tablet by mouth 3 (three) times daily as needed.     Hypromellose (HYDROXYPROPYL METHYLCELLULOSE) POWD  (Patient not taking: Reported on 11/16/2021)     magnesium 30 MG tablet Take 1 tablet by mouth at bedtime.     MEDROL 4 MG tablet Take by mouth as directed. (Patient not taking: Reported on 11/16/2021)     Melatonin 10 MG TABS Take 10 mg by mouth at bedtime.     Specialty Vitamins Products (PROSTATE PO) Take 1 capsule by mouth daily. PROSTATE FORMULA     vitamin B-12 (CYANOCOBALAMIN) 1000 MCG tablet Take 1,000 mcg by mouth daily.     Vitamin D-Vitamin K (D3 + K2 DOTS PO) Take 1 tablet by mouth daily.     No current facility-administered medications for this encounter.    Physical Findings: The patient is in no acute distress. Patient is alert and oriented.  vitals were not taken for this visit. .  No significant changes. Lungs are clear to auscultation bilaterally. Heart has regular rate and rhythm. No palpable cervical, supraclavicular, or axillary adenopathy. Abdomen soft, non-tender, normal bowel sounds. ***   Lab Findings: Lab Results  Component Value Date   WBC 4.7  11/28/2021   HGB 14.3 11/28/2021   HCT 41.6 11/28/2021   MCV 95.4 11/28/2021   PLT 183 11/28/2021    Radiographic Findings: No results found.  Impression:  The primary encounter diagnosis was Adenocarcinoma of right lung, stage 3 ([place]). A diagnosis of Metastasis to bone [facility]) was also pertinent to this visit.   Stage IIIb (T2b, N3, M0) non-small cell lung cancer, adenocarcinoma diagnosed in 2020: s/p concurrent chemoradiation and immunotherapy. Now with new evidence of bone metastases on a PET scan in March 2023.  The patient  is recovering from the effects of radiation.  ***  Plan:  ***   *** minutes of total time was spent for this patient encounter, including preparation, face-to-face counseling with the patient and coordination of care, physical exam, and documentation of the encounter. ____________________________________  Blair Promise, PhD, MD  This document serves as a record of services personally performed by Gery Pray, MD. It was created on his behalf by Roney Mans, a trained medical scribe. The creation of this record is based on the scribe's personal observations and the provider's statements to them. This document has been checked and approved by the attending provider.

## 2022-01-16 ENCOUNTER — Other Ambulatory Visit: Payer: Self-pay

## 2022-01-16 ENCOUNTER — Ambulatory Visit
Admission: RE | Admit: 2022-01-16 | Discharge: 2022-01-16 | Disposition: A | Discharge status: Home / Self Care | Payer: Medicare Other | Source: Ambulatory Visit | Attending: Radiation Oncology | Admitting: Radiation Oncology

## 2022-01-16 VITALS — BP 110/79 | HR 94 | Temp 97.3°F | Resp 18 | Ht 76.0 in | Wt 186.0 lb

## 2022-01-16 DIAGNOSIS — C7951 Secondary malignant neoplasm of bone: Secondary | ICD-10-CM | POA: Diagnosis present

## 2022-01-16 DIAGNOSIS — C3491 Malignant neoplasm of unspecified part of right bronchus or lung: Secondary | ICD-10-CM | POA: Diagnosis present

## 2022-01-16 HISTORY — DX: Personal history of irradiation: Z92.3

## 2022-01-16 NOTE — Progress Notes (Signed)
Linna Hoff is here for follow up after treatment to his right pelvis and left humerous.    Pain: 3-4 out of 10 in his lower back.  Only taking 1 a day of oxycodone if needed.    Denies skin irritation.  Fatigue is improving.  BP 110/79 (BP Location: Left Arm, Patient Position: Sitting)   Pulse 94   Temp (!) 97.3 F (36.3 C) (Temporal)   Resp 18   Ht 6\' 4"  (1.93 m)   Wt 186 lb (84.4 kg)   SpO2 99%   BMI 22.64 kg/m

## 2022-01-17 ENCOUNTER — Other Ambulatory Visit: Payer: Self-pay | Admitting: Radiation Oncology

## 2022-01-17 DIAGNOSIS — C7951 Secondary malignant neoplasm of bone: Secondary | ICD-10-CM

## 2022-02-21 ENCOUNTER — Encounter (HOSPITAL_COMMUNITY): Payer: Self-pay

## 2022-02-21 ENCOUNTER — Other Ambulatory Visit: Payer: Self-pay

## 2022-02-21 ENCOUNTER — Ambulatory Visit (HOSPITAL_COMMUNITY)
Admission: RE | Admit: 2022-02-21 | Discharge: 2022-02-21 | Disposition: A | Discharge status: Home / Self Care | Payer: Medicare Other | Source: Ambulatory Visit | Attending: Internal Medicine | Admitting: Internal Medicine

## 2022-02-21 ENCOUNTER — Inpatient Hospital Stay: Payer: Medicare Other | Attending: Internal Medicine

## 2022-02-21 DIAGNOSIS — C3490 Malignant neoplasm of unspecified part of unspecified bronchus or lung: Secondary | ICD-10-CM | POA: Diagnosis present

## 2022-02-21 DIAGNOSIS — C7951 Secondary malignant neoplasm of bone: Secondary | ICD-10-CM | POA: Insufficient documentation

## 2022-02-21 DIAGNOSIS — C3431 Malignant neoplasm of lower lobe, right bronchus or lung: Secondary | ICD-10-CM | POA: Insufficient documentation

## 2022-02-21 LAB — CMP (CANCER CENTER ONLY)
ALT: 25 U/L (ref 0–44)
AST: 22 U/L (ref 15–41)
Albumin: 4.4 g/dL (ref 3.5–5.0)
Alkaline Phosphatase: 66 U/L (ref 38–126)
Anion gap: 4 — ABNORMAL LOW (ref 5–15)
BUN: 18 mg/dL (ref 8–23)
CO2: 30 mmol/L (ref 22–32)
Calcium: 9.7 mg/dL (ref 8.9–10.3)
Chloride: 108 mmol/L (ref 98–111)
Creatinine: 0.78 mg/dL (ref 0.61–1.24)
GFR, Estimated: >60 mL/min (ref >60)
Glucose, Bld: 100 mg/dL — ABNORMAL HIGH (ref 70–99)
Potassium: 4.1 mmol/L (ref 3.5–5.1)
Sodium: 142 mmol/L (ref 135–145)
Total Bilirubin: 1.2 mg/dL (ref 0.3–1.2)
Total Protein: 6.8 g/dL (ref 6.5–8.1)

## 2022-02-21 LAB — CBC WITH DIFFERENTIAL (CANCER CENTER ONLY)
Abs Immature Granulocytes: 0.01 10*3/uL (ref 0.00–0.07)
Basophils Absolute: 0 10*3/uL (ref 0.0–0.1)
Basophils Relative: 1 %
Eosinophils Absolute: 0.2 10*3/uL (ref 0.0–0.5)
Eosinophils Relative: 4 %
HCT: 41.3 % (ref 39.0–52.0)
Hemoglobin: 14.3 g/dL (ref 13.0–17.0)
Immature Granulocytes: 0 %
Lymphocytes Relative: 13 %
Lymphs Abs: 0.6 10*3/uL — ABNORMAL LOW (ref 0.7–4.0)
MCH: 32.3 pg (ref 26.0–34.0)
MCHC: 34.6 g/dL (ref 30.0–36.0)
MCV: 93.2 fL (ref 80.0–100.0)
Monocytes Absolute: 0.5 10*3/uL (ref 0.1–1.0)
Monocytes Relative: 11 %
Neutro Abs: 3.2 10*3/uL (ref 1.7–7.7)
Neutrophils Relative %: 71 %
Platelet Count: 153 10*3/uL (ref 150–400)
RBC: 4.43 MIL/uL (ref 4.22–5.81)
RDW: 12.2 % (ref 11.5–15.5)
WBC Count: 4.4 10*3/uL (ref 4.0–10.5)
nRBC: 0 % (ref 0.0–0.2)

## 2022-02-21 MED ORDER — IOHEXOL 300 MG/ML  SOLN
100.0000 mL | Freq: Once | INTRAMUSCULAR | Status: AC | PRN
  Administered 2022-02-21: 100 mL via INTRAVENOUS

## 2022-02-23 ENCOUNTER — Inpatient Hospital Stay (HOSPITAL_BASED_OUTPATIENT_CLINIC_OR_DEPARTMENT_OTHER): Payer: Medicare Other | Admitting: Internal Medicine

## 2022-02-23 ENCOUNTER — Other Ambulatory Visit: Payer: Self-pay

## 2022-02-23 VITALS — BP 108/83 | HR 87 | Temp 97.8°F | Resp 17 | Wt 184.4 lb

## 2022-02-23 DIAGNOSIS — C3431 Malignant neoplasm of lower lobe, right bronchus or lung: Secondary | ICD-10-CM | POA: Diagnosis present

## 2022-02-23 DIAGNOSIS — C3490 Malignant neoplasm of unspecified part of unspecified bronchus or lung: Secondary | ICD-10-CM | POA: Diagnosis not present

## 2022-02-23 DIAGNOSIS — C7951 Secondary malignant neoplasm of bone: Secondary | ICD-10-CM | POA: Diagnosis not present

## 2022-02-23 DIAGNOSIS — C3491 Malignant neoplasm of unspecified part of right bronchus or lung: Secondary | ICD-10-CM | POA: Diagnosis not present

## 2022-02-23 NOTE — Progress Notes (Signed)
[facility] Telephone:([phone]) [phone]   [phone]  OFFICE PROGRESS NOTE  [name], L.[name], [place] [address] [place] [address]  DIAGNOSIS: Suspicious metastatic non-small cell lung cancer initially diagnosed as stage IIIb (T2b, N3, M0) non-small cell lung cancer, adenocarcinoma presented with right lower lobe lung nodule in addition to right hilar mass/node as well as ipsilateral and contralateral mediastinal lymphadenopathy diagnosed in October 2020.  The patient has evidence of bone metastases on a PET scan in March 2023.  Molecular studies by [facility] showed no actionable mutations.  PRIOR THERAPY:  1) Concurrent chemoradiation with weekly carboplatin for AUC of 2 and paclitaxel 45 mg/M2.  Status post 7 cycles.  Last dose was given July 21, 2019. 2) Consolidation treatment with immunotherapy with Imfinzi 1500 mg.  Status post 13 cycles.  Last dose was given August 19, 2020. 3) palliative radiotherapy to the right iliac bone as well as the left humerus under the care of Dr. Sondra Come completed on Dec 16, 2021  CURRENT THERAPY: Observation.  INTERVAL HISTORY: Alan [name] 66 y.o. male returns to the clinic today for follow-up visit accompanied by his wife.  The patient continues to complain of low back pain that he rated as 3 on a scale from 1-10.  He is using lidocaine patch every other day in addition to some ibuprofen as needed.  He has hydrocodone at home but he use it only for severe pain.  The patient received palliative radiotherapy to the right iliac bone as well as the left humerus under the care of Dr. Sondra Come.  The patient denied having any current chest pain, shortness of breath, cough or hemoptysis.  He denied having any fever or chills.  He has no nausea, vomiting, diarrhea or constipation.  He denied having any headache or visual changes.  The patient is here today for evaluation with repeat CT scan of the chest, abdomen and  pelvis for restaging of his disease.   MEDICAL HISTORY: Past Medical History:  Diagnosis Date   Adenocarcinoma of right lung, stage 3 ([place]) dx'd 03/2019   CAD S/P percutaneous coronary angioplasty 2004   ([place] STEMI) - Prox LAD 100% => 2 overlapping 3.5 x 1.8 mm Cypher DES stents.;  Patent as of August 2015   Dyslipidemia, goal LDL below 70    Essential hypertension    GERD (gastroesophageal reflux disease)    History of Mitral valve prolapse    Moderate - with moderate MR, noted February t 2013   History of radiation therapy    Right Pelvis & left humerus- 12/05/21-12/16/21- Dr. Gery Pray   History of Severe mitral regurgitation by prior echocardiogram 02/06/2014   TEE: Severe mitral regurgitation with a flail P2 segment and ruptured; Normal LV size & function, dilated LA.   Incidental lung nodule, greater than or equal to 51mm 03/16/2014   Ground glass opacity RML noted on CT scan   PONV (postoperative nausea and vomiting)    S/P Minimally Invasive MVR (mitral valve repair) 04/08/2014   Complex valvuloplasty including quadrangular resection of flail segment of posterior leaflet, sliding leaflet plasty, chordal transfer x1, Gore-tex neocord placement x4 and 34 mm Sorin Memo 3D rechord ring annuloplasty via right mini thoracotomy approach   ST elevation myocardial infarction (STEMI) of anterior wall, subsequent episode of care ([place]) 2004   he had a proximal LAD occlusion treated with 2 overlapping 3.5 x 1.8 mm Cypher DES stents.    ALLERGIES:  is allergic to a-cillin [ampicillin], amoxicillin, other, penicillins, sulfa antibiotics, and crestor [rosuvastatin].  MEDICATIONS:  Current Outpatient Medications  Medication Sig Dispense Refill   Ascorbic Acid (VITAMIN C) 1000 MG tablet Take 2,000 mg by mouth daily.      atorvastatin (LIPITOR) 10 MG tablet Take 1 tablet (10 mg total) by mouth daily. 90 tablet 3   Coenzyme Q10 (CO Q 10) 100 MG CAPS Take 100 mg by mouth daily.       HYDROcodone-acetaminophen (NORCO/VICODIN) 5-325 MG tablet Take 1 tablet by mouth 3 (three) times daily as needed. (Patient not taking: Reported on 01/16/2022)     Hypromellose (HYDROXYPROPYL METHYLCELLULOSE) POWD      magnesium 30 MG tablet Take 1 tablet by mouth at bedtime.     Melatonin 10 MG TABS Take 10 mg by mouth at bedtime.     naltrexone (DEPADE) 50 MG tablet Take by mouth daily. Taking 1 mg daily     Specialty Vitamins Products (PROSTATE PO) Take 1 capsule by mouth daily. PROSTATE FORMULA     vitamin B-12 (CYANOCOBALAMIN) 1000 MCG tablet Take 1,000 mcg by mouth daily.     Vitamin D-Vitamin K (D3 + K2 DOTS PO) Take 1 tablet by mouth daily.     No current facility-administered medications for this visit.    SURGICAL HISTORY:  Past Surgical History:  Procedure Laterality Date   ANTERIOR CRUCIATE LIGAMENT REPAIR Left 1993   COLONOSCOPY     INTRAOPERATIVE TRANSESOPHAGEAL ECHOCARDIOGRAM N/A 04/08/2014   Procedure: INTRAOPERATIVE TRANSESOPHAGEAL ECHOCARDIOGRAM;  Surgeon: Rexene Alberts, MD;  Location: [facility];  Service: Open Heart Surgery;  Laterality: N/A;   LEFT AND RIGHT HEART CATHETERIZATION WITH CORONARY ANGIOGRAM N/A 03/05/2014   Procedure: LEFT AND RIGHT HEART CATHETERIZATION WITH CORONARY ANGIOGRAM;  Surgeon: Leonie Man, MD;  Location: [facility] CATH LAB;  Service: Cardiovascular; (pre-op) Widely patent LAD stents, 40% distal LAD, 30% Cx, ~50% PL-PDA bifurcation lesion    LEFT HEART CATH AND CORONARY ANGIOGRAPHY  2004   In setting of anterior STEMI, found to have 100% % proximal LAD (2 DES Cypher stents)   LEFT HEART CATH AND CORONARY ANGIOGRAPHY  2007   Widely patent LAD stents, 40% distal LAD, 30% Cx, ~50% PL-PDA bifurcation lesion    LEFT HEART CATHETERIZATION WITH CORONARY ANGIOGRAM N/A 09/15/2011   Procedure: LEFT HEART CATHETERIZATION WITH CORONARY ANGIOGRAM;  Surgeon: Lorretta Harp, MD;  Location: [facility] CATH LAB;  Service: Cardiovascular;  Laterality: N/A;   MITRAL VALVE REPAIR  Right 04/08/2014   Procedure: MINIMALLY INVASIVE MITRAL VALVE REPAIR (MVR);  Surgeon: Rexene Alberts, MD;  Location: [place];  Service: Open Heart Surgery;  Laterality: Right;   NM MYOVIEW LTD  12/2009   Walk 9 minutes, and 10 METs, diaphragmatic attenuation but no ischemia or infarction.   SVT ABLATION N/A 10/17/2019   Procedure: SVT ABLATION;  Surgeon: Constance Haw, MD;  Location: [place] CV LAB;  Service: Cardiovascular;  Laterality: N/A;   TEE WITHOUT CARDIOVERSION N/A 02/06/2014   Procedure: TRANSESOPHAGEAL ECHOCARDIOGRAM (TEE);  Surgeon: Pixie Casino, MD;  Normal LV Size U& function - EF 55-60%, no regional WMA.  MV P2 Leaflet is flail with ruptured chord with severe prolapse, anterior leaflet intact.  Severe, eccentric anterior directed MR with dilated LA.   TRANSTHORACIC ECHOCARDIOGRAM  07/18/2018   Normal LV size and function.  EF 50-60 %.  Unable to assess diastolic function.  Mitral valve sewing ring in place.  Mild stenosis with a gradient of  6 mmHg noted. -   TRANSTHORACIC ECHOCARDIOGRAM  09/30/2021   Stable findings.  EF 50 to 55%.  No RWMA.  Mitral valve stable.  No significant MR.  Normal aortic valve.  Mild aortic dilation, but probably normal for age.   VIDEO BRONCHOSCOPY WITH ENDOBRONCHIAL ULTRASOUND N/A 05/16/2019   Procedure: VIDEO BRONCHOSCOPY WITH ENDOBRONCHIAL ULTRASOUND;  Surgeon: Marshell Garfinkel, MD;  Location: [facility] [facility];  Service: Pulmonary;  Laterality: N/A;    REVIEW OF SYSTEMS:  Constitutional: negative Eyes: negative Ears, nose, mouth, throat, and face: negative Respiratory: negative Cardiovascular: negative Gastrointestinal: negative Genitourinary:negative Integument/breast: negative Hematologic/lymphatic: negative Musculoskeletal:positive for back pain Neurological: negative Behavioral/Psych: negative Endocrine: negative Allergic/Immunologic: negative   PHYSICAL EXAMINATION: General appearance: alert, cooperative, fatigued, and no  distress Head: Normocephalic, without obvious abnormality, atraumatic Neck: no adenopathy, no JVD, supple, symmetrical, trachea midline, and thyroid not enlarged, symmetric, no tenderness/mass/nodules Lymph nodes: Cervical, supraclavicular, and axillary nodes normal. Resp: clear to auscultation bilaterally Back: symmetric, no curvature. ROM normal. No CVA tenderness. Cardio: regular rate and rhythm, S1, S2 normal, no murmur, click, rub or gallop GI: soft, non-tender; bowel sounds normal; no masses,  no organomegaly Extremities: extremities normal, atraumatic, no cyanosis or edema Neurologic: Alert and oriented X 3, normal strength and tone. Normal symmetric reflexes. Normal coordination and gait  ECOG PERFORMANCE STATUS: 1 - Symptomatic but completely ambulatory  Blood pressure 108/83, pulse 87, temperature 97.8 F (36.6 C), temperature source Oral, resp. rate 17, weight 184 lb 6 oz (83.6 kg), SpO2 98 %.  LABORATORY DATA: Lab Results  Component Value Date   WBC 4.4 02/21/2022   HGB 14.3 02/21/2022   HCT 41.3 02/21/2022   MCV 93.2 02/21/2022   PLT 153 02/21/2022      Chemistry      Component Value Date/Time   NA 142 02/21/2022 0957   NA 141 02/25/2020 0915   K 4.1 02/21/2022 0957   CL 108 02/21/2022 0957   CO2 30 02/21/2022 0957   BUN 18 02/21/2022 0957   BUN 14 02/25/2020 0915   CREATININE 0.78 02/21/2022 0957   CREATININE 0.80 02/27/2014 1152      Component Value Date/Time   CALCIUM 9.7 02/21/2022 0957   ALKPHOS 66 02/21/2022 0957   AST 22 02/21/2022 0957   ALT 25 02/21/2022 0957   BILITOT 1.2 02/21/2022 0957       RADIOGRAPHIC STUDIES: CT Chest W Contrast  Result Date: 02/22/2022 CLINICAL DATA:  Primary Cancer Type: Lung Imaging Indication: Assess response to therapy Interval therapy since last imaging? Yes Initial Cancer Diagnosis Date: 05/16/2019; Established by: Biopsy-proven Detailed Pathology: Stage IIIb non-small cell lung cancer, adenocarcinoma. Primary  Tumor location:  Right lower lobe. Surgeries: Mitral valve repair 2015. Chemotherapy: Yes; Ongoing? No; Most recent administration: 08/19/2018 Immunotherapy?  Yes; Type: Imfinzi; Ongoing? No Radiation therapy? Yes Date Range: 12/05/2021 - 12/16/2021; Target: Right ilium and left humerus Date Range: 06/02/2019 - 07/22/2019; Target: Right lung * Tracking Code: BO * EXAM: CT CHEST, ABDOMEN, AND PELVIS WITH CONTRAST TECHNIQUE: Multidetector CT imaging of the chest, abdomen and pelvis was performed following the standard protocol during bolus administration of intravenous contrast. RADIATION DOSE REDUCTION: This exam was performed according to the departmental dose-optimization program which includes automated exposure control, adjustment of the mA and/or kV according to patient size and/or use of iterative reconstruction technique. CONTRAST:  165mL OMNIPAQUE IOHEXOL 300 MG/ML  SOLN COMPARISON:  Most recent CT chest 08/19/2021.  11/11/2021 PET-CT. FINDINGS: CT CHEST FINDINGS Cardiovascular: Aortic atherosclerosis. Normal heart size,  without pericardial effusion. Three vessel coronary artery calcification. No central pulmonary embolism, on this non-dedicated study. Mitral valve repair. Mediastinum/Nodes: No supraclavicular adenopathy. No mediastinal or hilar adenopathy. Lungs/Pleura: Similar trace right pleural fluid or thickening. Mild centrilobular emphysema. Similar configuration of right perihilar radiation induced fibrosis with architectural distortion and volume loss. No residual or locally recurrent disease. Musculoskeletal: A right T11 vertebral body sclerotic lesion of 9 mm on 58/2 is increased from 4 mm on the prior PET (when remeasured). Left proximal humerus sclerotic lesion is incompletely imaged on 01/02. Left-sided T3 vertebral body sclerotic lesion of 9 mm is similar. CT ABDOMEN PELVIS FINDINGS Hepatobiliary: Tiny low-density segment 2 lesion is likely a cyst and unchanged. Normal gallbladder, without  biliary ductal dilatation. Pancreas: Normal, without mass or ductal dilatation. Spleen: Normal in size, without focal abnormality. Adrenals/Urinary Tract: Normal adrenal glands. Left renal too small to characterize lesions. Normal right kidney. Normal urinary bladder. Stomach/Bowel: Normal stomach, without wall thickening. Normal colon, appendix, and terminal ileum. Normal small bowel. Vascular/Lymphatic: Aortic atherosclerosis. Multiple right renal arteries. No abdominopelvic adenopathy. Reproductive: Mild prostatomegaly. Other: No significant free fluid. No evidence of omental or peritoneal disease. 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 Musculoskeletal: Right iliac sclerotic lesion of 5.9 cm is similar to the prior PET. IMPRESSION: 1. Primarily similar osseous metastasis compared to the PET of 11/11/2021. A right-sided T11 vertebral body lesion has enlarged since that exam. 2. No soft tissue metastasis within the chest, abdomen or pelvis. 3. 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 4. Aortic atherosclerosis (ICD10-I70.0), coronary artery atherosclerosis and emphysema (ICD10-J43.9). Electronically Signed   By: Abigail Miyamoto M.D.   On: 02/22/2022 11:11   CT Abdomen Pelvis W Contrast  Result Date: 02/22/2022 CLINICAL DATA:  Primary Cancer Type: Lung Imaging Indication: Assess response to therapy Interval therapy since last imaging? Yes Initial Cancer Diagnosis Date: 05/16/2019; Established by: Biopsy-proven Detailed Pathology: Stage IIIb non-small cell lung cancer, adenocarcinoma. Primary Tumor location:  Right lower lobe. Surgeries: Mitral valve repair 2015. Chemotherapy: Yes; Ongoing? No; Most recent administration: 08/19/2018 Immunotherapy?  Yes; Type: Imfinzi; Ongoing? No Radiation therapy? Yes Date Range: 12/05/2021 - 12/16/2021; Target: Right ilium and left humerus Date Range: 06/02/2019 - 07/22/2019; Target: Right lung  * Tracking Code: BO * EXAM: CT CHEST, ABDOMEN, AND PELVIS WITH CONTRAST TECHNIQUE: Multidetector CT imaging of the chest, abdomen and pelvis was performed following the standard protocol during bolus administration of intravenous contrast. RADIATION DOSE REDUCTION: This exam was performed according to the departmental dose-optimization program which includes automated exposure control, adjustment of the mA and/or kV according to patient size and/or use of iterative reconstruction technique. CONTRAST:  153mL OMNIPAQUE IOHEXOL 300 MG/ML  SOLN COMPARISON:  Most recent CT chest 08/19/2021.  11/11/2021 PET-CT. FINDINGS: CT CHEST FINDINGS Cardiovascular: Aortic atherosclerosis. Normal heart size, without pericardial effusion. Three vessel coronary artery calcification. No central pulmonary embolism, on this non-dedicated study. Mitral valve repair. Mediastinum/Nodes: No supraclavicular adenopathy. No mediastinal or hilar adenopathy. Lungs/Pleura: Similar trace right pleural fluid or thickening. Mild centrilobular emphysema. Similar configuration of right perihilar radiation induced fibrosis with architectural distortion and volume loss. No residual or locally recurrent disease. Musculoskeletal: A right T11 vertebral body sclerotic lesion of 9 mm on 58/2 is increased from 4 mm on the prior PET (when remeasured). Left proximal humerus sclerotic lesion is incompletely imaged on 01/02. Left-sided T3 vertebral body sclerotic lesion of 9 mm is similar. CT ABDOMEN  PELVIS FINDINGS Hepatobiliary: Tiny low-density segment 2 lesion is likely a cyst and unchanged. Normal gallbladder, without biliary ductal dilatation. Pancreas: Normal, without mass or ductal dilatation. Spleen: Normal in size, without focal abnormality. Adrenals/Urinary Tract: Normal adrenal glands. Left renal too small to characterize lesions. Normal right kidney. Normal urinary bladder. Stomach/Bowel: Normal stomach, without wall thickening. Normal colon, appendix,  and terminal ileum. Normal small bowel. Vascular/Lymphatic: Aortic atherosclerosis. Multiple right renal arteries. No abdominopelvic adenopathy. Reproductive: Mild prostatomegaly. Other: No significant free fluid. No evidence of omental or peritoneal disease. 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 Musculoskeletal: Right iliac sclerotic lesion of 5.9 cm is similar to the prior PET. IMPRESSION: 1. Primarily similar osseous metastasis compared to the PET of 11/11/2021. A right-sided T11 vertebral body lesion has enlarged since that exam. 2. No soft tissue metastasis within the chest, abdomen or pelvis. 3. 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 4. Aortic atherosclerosis (ICD10-I70.0), coronary artery atherosclerosis and emphysema (ICD10-J43.9). Electronically Signed   By: Abigail Miyamoto M.D.   On: 02/22/2022 11:11     ASSESSMENT AND PLAN: This is a very pleasant 66 years old white male recently diagnosed with likely metastatic non-small cell lung cancer initially diagnosed as stage IIIb non-small cell lung cancer, adenocarcinoma with no actionable mutation.  He completed a course of concurrent chemoradiation with weekly carboplatin and paclitaxel status post 7 cycles.   His treatment was complicated with dysphagia, odynophagia secondary to radiation induced esophagitis.  He also has persistent cough and shortness of breath secondary to radiation-induced pneumonitis. He has partial response after this treatment. The patient completed treatment with consolidation immunotherapy with Imfinzi 1500 mg IV every 4 weeks, status post 13 cycles.  Last dose was in January 2022. The patient tolerated his previous treatment fairly well with no concerning adverse effects. The patient had evidence for disease metastasis to the left humerus as well as medial right iliac wing and sacrum in March 2023 that was biopsy-proven to be metastatic  non-small cell lung cancer, adenocarcinoma. The patient received palliative radiotherapy to these areas under the care of Dr. Sondra Come.. The patient is feeling much better with less pain in the lower back. He had repeat CT scan of the chest, abdomen and pelvis performed recently.  I personally and independently reviewed the scan and discussed the result with the patient and his wife. His scan showed no concerning findings for disease progression and there was a stable disease in the right iliac bone.  There is no indication to start the patient on any systemic therapy at this point in the absence of disease progression. I recommended for the patient to continue on observation with repeat CT scan of the chest, abdomen and pelvis in 4 months. He was supposed to have a PET scan ordered by Dr. Sondra Come next months. The patient also has degenerative disc disease in the lower spine and he is interested in surgical intervention.  I do not see any contraindication for him to proceed with the surgery to improve his quality of life. The patient was advised to call immediately if he has any other concerning symptoms in the interval. The patient voices understanding of current disease status and treatment options and is in agreement with the current care plan. All questions were answered. The patient knows to call the clinic with any problems, questions or concerns. We can certainly see the patient much sooner if necessary.  Disclaimer: This  note was dictated with voice recognition software. Similar sounding words can inadvertently be transcribed and may not be corrected upon review.

## 2022-03-09 ENCOUNTER — Telehealth: Payer: Self-pay | Admitting: Internal Medicine

## 2022-03-09 NOTE — Telephone Encounter (Signed)
Called patient regarding upcoming November appointments, patient is notified. 

## 2022-03-20 ENCOUNTER — Encounter (HOSPITAL_COMMUNITY)
Admission: RE | Admit: 2022-03-20 | Discharge: 2022-03-20 | Disposition: A | Discharge status: Home / Self Care | Payer: Medicare Other | Source: Ambulatory Visit | Attending: Radiation Oncology | Admitting: Radiation Oncology

## 2022-03-20 DIAGNOSIS — C7951 Secondary malignant neoplasm of bone: Secondary | ICD-10-CM | POA: Insufficient documentation

## 2022-03-20 LAB — GLUCOSE, CAPILLARY: Glucose-Capillary: 102 mg/dL — ABNORMAL HIGH (ref 70–99)

## 2022-03-20 MED ORDER — FLUDEOXYGLUCOSE F - 18 (FDG) INJECTION
9.1900 | Freq: Once | INTRAVENOUS | Status: AC | PRN
  Administered 2022-03-20: 9.19 via INTRAVENOUS

## 2022-03-22 NOTE — Progress Notes (Incomplete)
Radiation Oncology         [phone] ________________________________  Name: Alan [name] MRN: [id number]  Date: 03/23/2022  DOB: April 01, 1956  Follow-Up Visit Note  CC: [name], L.[name], MD  Curt Bears, MD  No diagnosis found.  Diagnosis: The primary encounter diagnosis was Adenocarcinoma of right lung, stage 3 ([place]). A diagnosis of Metastasis to bone [facility]) was also pertinent to this visit.   Stage IIIb (T2b, N3, M0) non-small cell lung cancer, adenocarcinoma diagnosed in 2020: s/p concurrent chemoradiation and immunotherapy. Now with new evidence of bone metastases on a PET scan in March 2023.  Interval Since Last Radiation: 3 months and 5 days   Intent: Palliative  Radiation Treatment Dates: 12/05/2021 through 12/16/2021 Site Technique Total Dose (Gy) Dose per Fx (Gy) Completed Fx Beam Energies  Ilium, Right: Pelvis_R 3D 30/30 3 10/10 15X  Humerus, Left: Ext_L Complex 30/30 3 10/10 10X      Radiation Treatment Dates: 06/09/2019 through 07/22/2019 Site Technique Total Dose (Gy) Dose per Fx (Gy) Completed Fx Beam Energies  Lung, Right: Lung_Rt 3D 60/60 2 30/30 6X, 15X        Narrative:  The patient returns today for routine follow-up and to review recent PET imaging, he was last seen here for follow up on 01/16/22. Since his last visit, he had a repeat CT of the chest abdomen and pelvis performed on 02/21/22 which showed mostly stable osseous metastasis compared to PET scan performed on 11/11/2021 other than enlargement of a right-sided T11 vertebral body lesion since his last PET scan in March. Otherwise, CT showed no evidence of soft tissue metastasis within the chest, abdomen or pelvis, and similar appearing radiation fibrosis in the perihilar right lung.  The patient recently followed up with Dr. Julien Nordmann on 02/23/22. During which time, the patient reported issues with ongoing lower back pain rated at a 3/10 related to DDD. The patient was also noted to express interest  in surgical intervention to his lower spine in management of his DDD. In terms of his disease, Dr. Julien Nordmann did not recommend any further systemic therapy given that his recent CT showed no evidence of disease progression.        Recent restaging PET scan performed on 03/20/22 showed similar/stable osseous metastatic disease when compared to CT performed on 02/21/22, and no new metastatic lesions.                           Allergies:  is allergic to a-cillin [ampicillin], amoxicillin, other, penicillins, sulfa antibiotics, and crestor [rosuvastatin].  Meds: Current Outpatient Medications  Medication Sig Dispense Refill   Ascorbic Acid (VITAMIN C) 1000 MG tablet Take 2,000 mg by mouth daily.      atorvastatin (LIPITOR) 10 MG tablet Take 1 tablet (10 mg total) by mouth daily. 90 tablet 3   Coenzyme Q10 (CO Q 10) 100 MG CAPS Take 100 mg by mouth daily.      HYDROcodone-acetaminophen (NORCO/VICODIN) 5-325 MG tablet Take 1 tablet by mouth 3 (three) times daily as needed. (Patient not taking: Reported on 01/16/2022)     Hypromellose (HYDROXYPROPYL METHYLCELLULOSE) POWD      magnesium 30 MG tablet Take 1 tablet by mouth at bedtime.     Melatonin 10 MG TABS Take 10 mg by mouth at bedtime.     naltrexone (DEPADE) 50 MG tablet Take by mouth daily. Taking 1 mg daily     Specialty Vitamins Products (PROSTATE PO) Take 1  capsule by mouth daily. PROSTATE FORMULA     vitamin B-12 (CYANOCOBALAMIN) 1000 MCG tablet Take 1,000 mcg by mouth daily.     Vitamin D-Vitamin K (D3 + K2 DOTS PO) Take 1 tablet by mouth daily.     No current facility-administered medications for this encounter.    Physical Findings: The patient is in no acute distress. Patient is alert and oriented.  vitals were not taken for this visit. .  No significant changes. Lungs are clear to auscultation bilaterally. Heart has regular rate and rhythm. No palpable cervical, supraclavicular, or axillary adenopathy. Abdomen soft, non-tender, normal  bowel sounds.   Lab Findings: Lab Results  Component Value Date   WBC 4.4 02/21/2022   HGB 14.3 02/21/2022   HCT 41.3 02/21/2022   MCV 93.2 02/21/2022   PLT 153 02/21/2022    Radiographic Findings: NM PET Image Restag (PS) Skull Base To Thigh  Result Date: 03/20/2022 CLINICAL DATA:  Subsequent treatment strategy for lung cancer. EXAM: NUCLEAR MEDICINE PET SKULL BASE TO THIGH TECHNIQUE: 9.2 mCi F-18 FDG was injected intravenously. Full-ring PET imaging was performed from the skull base to thigh after the radiotracer. CT data was obtained and used for attenuation correction and anatomic localization. Fasting blood glucose: 102 mg/dl COMPARISON:  CT chest abdomen pelvis 02/21/2022 and PET 11/11/2021. FINDINGS: Mediastinal blood pool activity: SUV max 2.5 Liver activity: SUV max NA NECK: No abnormal hypermetabolism. Incidental CT findings: None. CHEST: No hypermetabolic mediastinal, hilar or axillary lymph nodes. No hypermetabolic pulmonary nodules. Incidental CT findings: Atherosclerotic calcification of the aorta and coronary arteries. Heart size normal. No pericardial or pleural effusion. Post radiation scarring in the medial right hemithorax. ABDOMEN/PELVIS: No abnormal hypermetabolism in the liver, adrenal glands, spleen or pancreas. No hypermetabolic lymph nodes. Incidental CT findings: Liver, gallbladder, adrenal glands, kidneys, spleen, pancreas, stomach and bowel are grossly unremarkable. Atherosclerotic calcification of the aorta. SKELETON: Hypermetabolic sclerotic lesion in the proximal left humerus (4/61), SUV max 5 4, stable. Sclerotic metastases in the right sacrum and medial right iliac wing, SUV max 3.2, compared to 4.1 previously. 9 mm sclerotic lesion in right lateral aspect of the T11 vertebral body (4/124), SUV max 2.3. Sclerotic lesion in the T3 vertebral body measures 10 mm (4/69), SUV max 1.5. Lesions are similar in CT appearance from 02/21/2022. Incidental CT findings: Degenerative  changes in the spine. IMPRESSION: 1. Similar osseous metastatic disease.  No new metastatic lesions. 2. Aortic atherosclerosis (ICD10-I70.0). Coronary artery calcification. Electronically Signed   By: Lorin Picket M.D.   On: 03/20/2022 11:55   CT Chest W Contrast  Result Date: 02/22/2022 CLINICAL DATA:  Primary Cancer Type: Lung Imaging Indication: Assess response to therapy Interval therapy since last imaging? Yes Initial Cancer Diagnosis Date: 05/16/2019; Established by: Biopsy-proven Detailed Pathology: Stage IIIb non-small cell lung cancer, adenocarcinoma. Primary Tumor location:  Right lower lobe. Surgeries: Mitral valve repair 2015. Chemotherapy: Yes; Ongoing? No; Most recent administration: 08/19/2018 Immunotherapy?  Yes; Type: Imfinzi; Ongoing? No Radiation therapy? Yes Date Range: 12/05/2021 - 12/16/2021; Target: Right ilium and left humerus Date Range: 06/02/2019 - 07/22/2019; Target: Right lung * Tracking Code: BO * EXAM: CT CHEST, ABDOMEN, AND PELVIS WITH CONTRAST TECHNIQUE: Multidetector CT imaging of the chest, abdomen and pelvis was performed following the standard protocol during bolus administration of intravenous contrast. RADIATION DOSE REDUCTION: This exam was performed according to the departmental dose-optimization program which includes automated exposure control, adjustment of the mA and/or kV according to patient size and/or use of iterative reconstruction  technique. CONTRAST:  123mL OMNIPAQUE IOHEXOL 300 MG/ML  SOLN COMPARISON:  Most recent CT chest 08/19/2021.  11/11/2021 PET-CT. FINDINGS: CT CHEST FINDINGS Cardiovascular: Aortic atherosclerosis. Normal heart size, without pericardial effusion. Three vessel coronary artery calcification. No central pulmonary embolism, on this non-dedicated study. Mitral valve repair. Mediastinum/Nodes: No supraclavicular adenopathy. No mediastinal or hilar adenopathy. Lungs/Pleura: Similar trace right pleural fluid or thickening. Mild centrilobular  emphysema. Similar configuration of right perihilar radiation induced fibrosis with architectural distortion and volume loss. No residual or locally recurrent disease. Musculoskeletal: A right T11 vertebral body sclerotic lesion of 9 mm on 58/2 is increased from 4 mm on the prior PET (when remeasured). Left proximal humerus sclerotic lesion is incompletely imaged on 01/02. Left-sided T3 vertebral body sclerotic lesion of 9 mm is similar. CT ABDOMEN PELVIS FINDINGS Hepatobiliary: Tiny low-density segment 2 lesion is likely a cyst and unchanged. Normal gallbladder, without biliary ductal dilatation. Pancreas: Normal, without mass or ductal dilatation. Spleen: Normal in size, without focal abnormality. Adrenals/Urinary Tract: Normal adrenal glands. Left renal too small to characterize lesions. Normal right kidney. Normal urinary bladder. Stomach/Bowel: Normal stomach, without wall thickening. Normal colon, appendix, and terminal ileum. Normal small bowel. Vascular/Lymphatic: Aortic atherosclerosis. Multiple right renal arteries. No abdominopelvic adenopathy. Reproductive: Mild prostatomegaly. Other: No significant free fluid. No evidence of omental or peritoneal disease. 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 Musculoskeletal: Right iliac sclerotic lesion of 5.9 cm is similar to the prior PET. IMPRESSION: 1. Primarily similar osseous metastasis compared to the PET of 11/11/2021. A right-sided T11 vertebral body lesion has enlarged since that exam. 2. No soft tissue metastasis within the chest, abdomen or pelvis. 3. 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 4. Aortic atherosclerosis (ICD10-I70.0), coronary artery atherosclerosis and emphysema (ICD10-J43.9). Electronically Signed   By: Abigail Miyamoto M.D.   On: 02/22/2022 11:11   CT Abdomen Pelvis W Contrast  Result Date: 02/22/2022 CLINICAL DATA:  Primary Cancer Type: Lung Imaging  Indication: Assess response to therapy Interval therapy since last imaging? Yes Initial Cancer Diagnosis Date: 05/16/2019; Established by: Biopsy-proven Detailed Pathology: Stage IIIb non-small cell lung cancer, adenocarcinoma. Primary Tumor location:  Right lower lobe. Surgeries: Mitral valve repair 2015. Chemotherapy: Yes; Ongoing? No; Most recent administration: 08/19/2018 Immunotherapy?  Yes; Type: Imfinzi; Ongoing? No Radiation therapy? Yes Date Range: 12/05/2021 - 12/16/2021; Target: Right ilium and left humerus Date Range: 06/02/2019 - 07/22/2019; Target: Right lung * Tracking Code: BO * EXAM: CT CHEST, ABDOMEN, AND PELVIS WITH CONTRAST TECHNIQUE: Multidetector CT imaging of the chest, abdomen and pelvis was performed following the standard protocol during bolus administration of intravenous contrast. RADIATION DOSE REDUCTION: This exam was performed according to the departmental dose-optimization program which includes automated exposure control, adjustment of the mA and/or kV according to patient size and/or use of iterative reconstruction technique. CONTRAST:  119mL OMNIPAQUE IOHEXOL 300 MG/ML  SOLN COMPARISON:  Most recent CT chest 08/19/2021.  11/11/2021 PET-CT. FINDINGS: CT CHEST FINDINGS Cardiovascular: Aortic atherosclerosis. Normal heart size, without pericardial effusion. Three vessel coronary artery calcification. No central pulmonary embolism, on this non-dedicated study. Mitral valve repair. Mediastinum/Nodes: No supraclavicular adenopathy. No mediastinal or hilar adenopathy. Lungs/Pleura: Similar trace right pleural fluid or thickening. Mild centrilobular emphysema. Similar configuration of right perihilar radiation induced fibrosis with architectural distortion and volume loss. No residual or locally recurrent disease. Musculoskeletal: A right T11 vertebral body sclerotic lesion of 9 mm on 58/2 is increased from 4  mm on the prior PET (when remeasured). Left proximal humerus sclerotic lesion is  incompletely imaged on 01/02. Left-sided T3 vertebral body sclerotic lesion of 9 mm is similar. CT ABDOMEN PELVIS FINDINGS Hepatobiliary: Tiny low-density segment 2 lesion is likely a cyst and unchanged. Normal gallbladder, without biliary ductal dilatation. Pancreas: Normal, without mass or ductal dilatation. Spleen: Normal in size, without focal abnormality. Adrenals/Urinary Tract: Normal adrenal glands. Left renal too small to characterize lesions. Normal right kidney. Normal urinary bladder. Stomach/Bowel: Normal stomach, without wall thickening. Normal colon, appendix, and terminal ileum. Normal small bowel. Vascular/Lymphatic: Aortic atherosclerosis. Multiple right renal arteries. No abdominopelvic adenopathy. Reproductive: Mild prostatomegaly. Other: No significant free fluid. No evidence of omental or peritoneal disease. 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 Musculoskeletal: Right iliac sclerotic lesion of 5.9 cm is similar to the prior PET. IMPRESSION: 1. Primarily similar osseous metastasis compared to the PET of 11/11/2021. A right-sided T11 vertebral body lesion has enlarged since that exam. 2. No soft tissue metastasis within the chest, abdomen or pelvis. 3. 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 4. Aortic atherosclerosis (ICD10-I70.0), coronary artery atherosclerosis and emphysema (ICD10-J43.9). Electronically Signed   By: Abigail Miyamoto M.D.   On: 02/22/2022 11:11    Impression: The primary encounter diagnosis was Adenocarcinoma of right lung, stage 3 ([place]). A diagnosis of Metastasis to bone [facility]) was also pertinent to this visit.   Stage IIIb (T2b, N3, M0) non-small cell lung cancer, adenocarcinoma diagnosed in 2020: s/p concurrent chemoradiation and immunotherapy. Now with new evidence of bone metastases on a PET scan in March 2023.  The patient is recovering from the effects of radiation.  ***  Plan:   ***   *** minutes of total time was spent for this patient encounter, including preparation, face-to-face counseling with the patient and coordination of care, physical exam, and documentation of the encounter. ____________________________________  Blair Promise, PhD, MD  This document serves as a record of services personally performed by Gery Pray, MD. It was created on his behalf by Roney Mans, a trained medical scribe. The creation of this record is based on the scribe's personal observations and the provider's statements to them. This document has been checked and approved by the attending provider.

## 2022-03-23 ENCOUNTER — Other Ambulatory Visit: Payer: Self-pay

## 2022-03-23 ENCOUNTER — Ambulatory Visit
Admission: RE | Admit: 2022-03-23 | Discharge: 2022-03-23 | Disposition: A | Discharge status: Home / Self Care | Payer: Medicare Other | Source: Ambulatory Visit | Attending: Radiation Oncology | Admitting: Radiation Oncology

## 2022-03-23 VITALS — BP 113/84 | HR 86 | Temp 97.0°F | Resp 18 | Ht 76.0 in | Wt 181.8 lb

## 2022-03-23 DIAGNOSIS — C7951 Secondary malignant neoplasm of bone: Secondary | ICD-10-CM | POA: Insufficient documentation

## 2022-03-23 DIAGNOSIS — C3491 Malignant neoplasm of unspecified part of right bronchus or lung: Secondary | ICD-10-CM | POA: Diagnosis present

## 2022-03-23 NOTE — Progress Notes (Signed)
Alan [name] is here for follow up after treatment to his pelvis and humerus.  He reports having pain at a 5/10 in his lower back.  He is currently using salon pas and aleve. He reports having some relief in his lower back after radiation but now it is back to what it was.  He is going to have an injection in his back on 04/24/22.  He denies having any fatigue or skin irritation. He reports not having much of an appetite.  He is here to review his PET scan results.  BP 113/84 (BP Location: Right Arm, Patient Position: Sitting, Cuff Size: Normal)   Pulse 86   Temp (!) 97 F (36.1 C)   Resp 18   Ht 6\' 4"  (1.93 m)   Wt 181 lb 12.8 oz (82.5 kg)   SpO2 100%   BMI 22.13 kg/m

## 2022-06-22 ENCOUNTER — Inpatient Hospital Stay: Payer: Medicare Other | Attending: Internal Medicine

## 2022-06-22 ENCOUNTER — Ambulatory Visit (HOSPITAL_COMMUNITY)
Admission: RE | Admit: 2022-06-22 | Discharge: 2022-06-22 | Disposition: A | Discharge status: Home / Self Care | Payer: Medicare Other | Source: Ambulatory Visit | Attending: Internal Medicine | Admitting: Internal Medicine

## 2022-06-22 DIAGNOSIS — C3490 Malignant neoplasm of unspecified part of unspecified bronchus or lung: Secondary | ICD-10-CM

## 2022-06-22 DIAGNOSIS — C7951 Secondary malignant neoplasm of bone: Secondary | ICD-10-CM | POA: Insufficient documentation

## 2022-06-22 DIAGNOSIS — C3431 Malignant neoplasm of lower lobe, right bronchus or lung: Secondary | ICD-10-CM | POA: Insufficient documentation

## 2022-06-22 DIAGNOSIS — Z923 Personal history of irradiation: Secondary | ICD-10-CM | POA: Insufficient documentation

## 2022-06-22 LAB — CBC WITH DIFFERENTIAL (CANCER CENTER ONLY)
Abs Immature Granulocytes: 0.01 10*3/uL (ref 0.00–0.07)
Basophils Absolute: 0 10*3/uL (ref 0.0–0.1)
Basophils Relative: 1 %
Eosinophils Absolute: 0.1 10*3/uL (ref 0.0–0.5)
Eosinophils Relative: 2 %
HCT: 39.4 % (ref 39.0–52.0)
Hemoglobin: 13.5 g/dL (ref 13.0–17.0)
Immature Granulocytes: 0 %
Lymphocytes Relative: 12 %
Lymphs Abs: 0.6 10*3/uL — ABNORMAL LOW (ref 0.7–4.0)
MCH: 33.3 pg (ref 26.0–34.0)
MCHC: 34.3 g/dL (ref 30.0–36.0)
MCV: 97.3 fL (ref 80.0–100.0)
Monocytes Absolute: 0.5 10*3/uL (ref 0.1–1.0)
Monocytes Relative: 9 %
Neutro Abs: 4 10*3/uL (ref 1.7–7.7)
Neutrophils Relative %: 76 %
Platelet Count: 166 10*3/uL (ref 150–400)
RBC: 4.05 MIL/uL — ABNORMAL LOW (ref 4.22–5.81)
RDW: 12.4 % (ref 11.5–15.5)
WBC Count: 5.2 10*3/uL (ref 4.0–10.5)
nRBC: 0 % (ref 0.0–0.2)

## 2022-06-22 LAB — CMP (CANCER CENTER ONLY)
ALT: 25 U/L (ref 0–44)
AST: 27 U/L (ref 15–41)
Albumin: 4.3 g/dL (ref 3.5–5.0)
Alkaline Phosphatase: 76 U/L (ref 38–126)
Anion gap: 7 (ref 5–15)
BUN: 16 mg/dL (ref 8–23)
CO2: 28 mmol/L (ref 22–32)
Calcium: 9.5 mg/dL (ref 8.9–10.3)
Chloride: 107 mmol/L (ref 98–111)
Creatinine: 0.79 mg/dL (ref 0.61–1.24)
GFR, Estimated: >60 mL/min (ref >60)
Glucose, Bld: 91 mg/dL (ref 70–99)
Potassium: 4 mmol/L (ref 3.5–5.1)
Sodium: 142 mmol/L (ref 135–145)
Total Bilirubin: 0.8 mg/dL (ref 0.3–1.2)
Total Protein: 6.6 g/dL (ref 6.5–8.1)

## 2022-06-22 MED ORDER — SODIUM CHLORIDE (PF) 0.9 % IJ SOLN
INTRAMUSCULAR | Status: AC
  Filled 2022-06-22: qty 50

## 2022-06-22 MED ORDER — IOHEXOL 300 MG/ML  SOLN
100.0000 mL | Freq: Once | INTRAMUSCULAR | Status: AC | PRN
  Administered 2022-06-22: 100 mL via INTRAVENOUS

## 2022-06-26 ENCOUNTER — Inpatient Hospital Stay (HOSPITAL_BASED_OUTPATIENT_CLINIC_OR_DEPARTMENT_OTHER): Payer: Medicare Other | Admitting: Internal Medicine

## 2022-06-26 VITALS — BP 128/88 | HR 89 | Temp 97.7°F | Resp 17 | Wt 186.1 lb

## 2022-06-26 DIAGNOSIS — C3490 Malignant neoplasm of unspecified part of unspecified bronchus or lung: Secondary | ICD-10-CM

## 2022-06-26 DIAGNOSIS — Z923 Personal history of irradiation: Secondary | ICD-10-CM | POA: Diagnosis not present

## 2022-06-26 DIAGNOSIS — C7951 Secondary malignant neoplasm of bone: Secondary | ICD-10-CM | POA: Diagnosis not present

## 2022-06-26 DIAGNOSIS — C3431 Malignant neoplasm of lower lobe, right bronchus or lung: Secondary | ICD-10-CM | POA: Diagnosis present

## 2022-06-26 NOTE — Progress Notes (Signed)
[place] [phone]   [phone]  OFFICE PROGRESS NOTE  [name], L.[name], [place] [address] [place] [address]  DIAGNOSIS: Suspicious metastatic non-small cell lung cancer initially diagnosed as stage IIIb (T2b, N3, M0) non-small cell lung cancer, adenocarcinoma presented with right lower lobe lung nodule in addition to right hilar mass/node as well as ipsilateral and contralateral mediastinal lymphadenopathy diagnosed in October 2020.  The patient has evidence of bone metastases on a PET scan in March 2023.  Molecular studies by [facility] showed no actionable mutations.  PRIOR THERAPY:  1) Concurrent chemoradiation with weekly carboplatin for AUC of 2 and paclitaxel 45 mg/M2.  Status post 7 cycles.  Last dose was given July 21, 2019. 2) Consolidation treatment with immunotherapy with Imfinzi 1500 mg.  Status post 13 cycles.  Last dose was given August 19, 2020. 3) palliative radiotherapy to the right iliac bone as well as the left humerus under the care of Dr. Sondra Come completed on Dec 16, 2021  CURRENT THERAPY: Observation.  INTERVAL HISTORY: Alan [name] 66 y.o. male returns to the clinic today for follow-up visit accompanied by his wife.  The patient is feeling fine today with no concerning complaints except for the lower back pain.  He is followed by orthopedic surgery and still under evaluation for potential surgical intervention.  He denied having any current chest pain, shortness of breath, cough or hemoptysis.  He denied having any fever or chills.  He has no nausea, vomiting, diarrhea or constipation.  He has no headache or visual changes.  He denied having any recent weight loss or night sweats.  He is here today for evaluation with repeat CT scan of the chest, abdomen and pelvis for restaging of his disease.    MEDICAL HISTORY: Past Medical History:  Diagnosis Date   Adenocarcinoma of right lung, stage 3 ([place])  dx'd 03/2019   CAD S/P percutaneous coronary angioplasty 2004   (Ant STEMI) - Prox LAD 100% => 2 overlapping 3.5 x 1.8 mm Cypher DES stents.;  Patent as of August 2015   Dyslipidemia, goal LDL below 70    Essential hypertension    GERD (gastroesophageal reflux disease)    History of Mitral valve prolapse    Moderate - with moderate MR, noted February t 2013   History of radiation therapy    Right Pelvis & left humerus- 12/05/21-12/16/21- Dr. Gery Pray   History of Severe mitral regurgitation by prior echocardiogram 02/06/2014   TEE: Severe mitral regurgitation with a flail P2 segment and ruptured; Normal LV size & function, dilated LA.   Incidental lung nodule, greater than or equal to 82mm 03/16/2014   Ground glass opacity RML noted on CT scan   PONV (postoperative nausea and vomiting)    S/P Minimally Invasive MVR (mitral valve repair) 04/08/2014   Complex valvuloplasty including quadrangular resection of flail segment of posterior leaflet, sliding leaflet plasty, chordal transfer x1, Gore-tex neocord placement x4 and 34 mm Sorin Memo 3D rechord ring annuloplasty via right mini thoracotomy approach   ST elevation myocardial infarction (STEMI) of anterior wall, subsequent episode of care ([place]) 2004   he had a proximal LAD occlusion treated with 2 overlapping 3.5 x 1.8 mm Cypher DES stents.    ALLERGIES:  is allergic to a-cillin [ampicillin], amoxicillin, other, penicillins, sulfa antibiotics, and crestor [rosuvastatin].  MEDICATIONS:  Current Outpatient Medications  Medication Sig Dispense Refill   Ascorbic Acid (VITAMIN C) 1000  MG tablet Take 2,000 mg by mouth daily.      atorvastatin (LIPITOR) 10 MG tablet Take 1 tablet (10 mg total) by mouth daily. 90 tablet 3   Coenzyme Q10 (CO Q 10) 100 MG CAPS Take 100 mg by mouth daily.      HYDROcodone-acetaminophen (NORCO/VICODIN) 5-325 MG tablet Take 1 tablet by mouth 3 (three) times daily as needed. (Patient not taking: Reported on 01/16/2022)      Hypromellose (HYDROXYPROPYL METHYLCELLULOSE) POWD      magnesium 30 MG tablet Take 1 tablet by mouth at bedtime.     Melatonin 10 MG TABS Take 10 mg by mouth at bedtime.     naltrexone (DEPADE) 50 MG tablet Take by mouth daily. Taking 1 mg daily     Specialty Vitamins Products (PROSTATE PO) Take 1 capsule by mouth daily. PROSTATE FORMULA     vitamin B-12 (CYANOCOBALAMIN) 1000 MCG tablet Take 1,000 mcg by mouth daily.     Vitamin D-Vitamin K (D3 + K2 DOTS PO) Take 1 tablet by mouth daily.     No current facility-administered medications for this visit.    SURGICAL HISTORY:  Past Surgical History:  Procedure Laterality Date   ANTERIOR CRUCIATE LIGAMENT REPAIR Left 1993   COLONOSCOPY     INTRAOPERATIVE TRANSESOPHAGEAL ECHOCARDIOGRAM N/A 04/08/2014   Procedure: INTRAOPERATIVE TRANSESOPHAGEAL ECHOCARDIOGRAM;  Surgeon: Rexene Alberts, MD;  Location: [facility];  Service: Open Heart Surgery;  Laterality: N/A;   LEFT AND RIGHT HEART CATHETERIZATION WITH CORONARY ANGIOGRAM N/A 03/05/2014   Procedure: LEFT AND RIGHT HEART CATHETERIZATION WITH CORONARY ANGIOGRAM;  Surgeon: Leonie Man, MD;  Location: [facility] CATH LAB;  Service: Cardiovascular; (pre-op) Widely patent LAD stents, 40% distal LAD, 30% Cx, ~50% PL-PDA bifurcation lesion    LEFT HEART CATH AND CORONARY ANGIOGRAPHY  2004   In setting of anterior STEMI, found to have 100% % proximal LAD (2 DES Cypher stents)   LEFT HEART CATH AND CORONARY ANGIOGRAPHY  2007   Widely patent LAD stents, 40% distal LAD, 30% Cx, ~50% PL-PDA bifurcation lesion    LEFT HEART CATHETERIZATION WITH CORONARY ANGIOGRAM N/A 09/15/2011   Procedure: LEFT HEART CATHETERIZATION WITH CORONARY ANGIOGRAM;  Surgeon: Lorretta Harp, MD;  Location: [facility] CATH LAB;  Service: Cardiovascular;  Laterality: N/A;   MITRAL VALVE REPAIR Right 04/08/2014   Procedure: MINIMALLY INVASIVE MITRAL VALVE REPAIR (MVR);  Surgeon: Rexene Alberts, MD;  Location: [facility];  Service: Open Heart Surgery;   Laterality: Right;   NM MYOVIEW LTD  12/2009   Walk 9 minutes, and 10 METs, diaphragmatic attenuation but no ischemia or infarction.   SVT ABLATION N/A 10/17/2019   Procedure: SVT ABLATION;  Surgeon: Constance Haw, MD;  Location: [facility] CV LAB;  Service: Cardiovascular;  Laterality: N/A;   TEE WITHOUT CARDIOVERSION N/A 02/06/2014   Procedure: TRANSESOPHAGEAL ECHOCARDIOGRAM (TEE);  Surgeon: Pixie Casino, MD;  Normal LV Size U& function - EF 55-60%, no regional WMA.  MV P2 Leaflet is flail with ruptured chord with severe prolapse, anterior leaflet intact.  Severe, eccentric anterior directed MR with dilated LA.   TRANSTHORACIC ECHOCARDIOGRAM  07/18/2018   Normal LV size and function.  EF 50-60 %.  Unable to assess diastolic function.  Mitral valve sewing ring in place.  Mild stenosis with a gradient of 6 mmHg noted. -   TRANSTHORACIC ECHOCARDIOGRAM  09/30/2021   Stable findings.  EF 50 to 55%.  No RWMA.  Mitral valve stable.  No significant MR.  Normal aortic valve.  Mild aortic dilation, but probably normal for age.   VIDEO BRONCHOSCOPY WITH ENDOBRONCHIAL ULTRASOUND N/A 05/16/2019   Procedure: VIDEO BRONCHOSCOPY WITH ENDOBRONCHIAL ULTRASOUND;  Surgeon: Marshell Garfinkel, MD;  Location: [facility];  Service: Pulmonary;  Laterality: N/A;    REVIEW OF SYSTEMS:  Constitutional: negative Eyes: negative Ears, nose, mouth, throat, and face: negative Respiratory: negative Cardiovascular: negative Gastrointestinal: negative Genitourinary:negative Integument/breast: negative Hematologic/lymphatic: negative Musculoskeletal:positive for back pain Neurological: negative Behavioral/Psych: negative Endocrine: negative Allergic/Immunologic: negative   PHYSICAL EXAMINATION: General appearance: alert, cooperative, fatigued, and no distress Head: Normocephalic, without obvious abnormality, atraumatic Neck: no adenopathy, no JVD, supple, symmetrical, trachea midline, and thyroid not enlarged,  symmetric, no tenderness/mass/nodules Lymph nodes: Cervical, supraclavicular, and axillary nodes normal. Resp: clear to auscultation bilaterally Back: symmetric, no curvature. ROM normal. No CVA tenderness. Cardio: regular rate and rhythm, S1, S2 normal, no murmur, click, rub or gallop GI: soft, non-tender; bowel sounds normal; no masses,  no organomegaly Extremities: extremities normal, atraumatic, no cyanosis or edema Neurologic: Alert and oriented X 3, normal strength and tone. Normal symmetric reflexes. Normal coordination and gait  ECOG PERFORMANCE STATUS: 1 - Symptomatic but completely ambulatory  Blood pressure 128/88, pulse 89, temperature 97.7 F (36.5 C), temperature source Oral, resp. rate 17, weight 186 lb 1 oz (84.4 kg), SpO2 98 %.  LABORATORY DATA: Lab Results  Component Value Date   WBC 5.2 06/22/2022   HGB 13.5 06/22/2022   HCT 39.4 06/22/2022   MCV 97.3 06/22/2022   PLT 166 06/22/2022      Chemistry      Component Value Date/Time   NA 142 06/22/2022 1431   NA 141 02/25/2020 0915   K 4.0 06/22/2022 1431   CL 107 06/22/2022 1431   CO2 28 06/22/2022 1431   BUN 16 06/22/2022 1431   BUN 14 02/25/2020 0915   CREATININE 0.79 06/22/2022 1431   CREATININE 0.80 02/27/2014 1152      Component Value Date/Time   CALCIUM 9.5 06/22/2022 1431   ALKPHOS 76 06/22/2022 1431   AST 27 06/22/2022 1431   ALT 25 06/22/2022 1431   BILITOT 0.8 06/22/2022 1431       RADIOGRAPHIC STUDIES: CT Chest W Contrast  Result Date: 06/22/2022 CLINICAL DATA:  Non-small cell lung cancer.  * Tracking Code: BO * EXAM: CT CHEST, ABDOMEN AND PELVIS WITHOUT CONTRAST TECHNIQUE: Multidetector CT imaging of the chest, abdomen and pelvis was performed following the standard protocol without IV contrast. RADIATION DOSE REDUCTION: This exam was performed according to the departmental dose-optimization program which includes automated exposure control, adjustment of the mA and/or kV according to  patient size and/or use of iterative reconstruction technique. COMPARISON:  03/20/2022 FINDINGS: CT CHEST FINDINGS Cardiovascular: No acute cardiovascular findings. Coronary artery calcification and aortic atherosclerotic calcification. Truncation of the RIGHT upper lobe pulmonary arteries related to prior lung surgery. Mediastinum/Nodes: No axillary or supraclavicular adenopathy. No mediastinal or hilar adenopathy. No pericardial fluid. Esophagus normal. Lungs/Pleura: Linear perihilar consolidation not changed from comparison exam consistent post radiation treatment effect. No suspicious nodularity. LEFT lung is hyperexpanded. No suspicious LEFT lung nodules. Musculoskeletal: Stable sclerotic lesion at T11. CT ABDOMEN AND PELVIS FINDINGS Hepatobiliary: Tiny hypodense lesion in the LEFT hepatic lobe (image 61/2) is unchanged. No new hepatic lesions. Gallbladder normal. Pancreas: Pancreas is normal. No ductal dilatation. No pancreatic inflammation. Spleen: Normal spleen Adrenals/urinary tract: Adrenal glands and kidneys are normal. The ureters and bladder normal. Stomach/Bowel: Stomach, small bowel, appendix, and cecum are normal. The  colon and rectosigmoid colon are normal. Vascular/Lymphatic: Abdominal aorta is normal caliber. There is no retroperitoneal or periportal lymphadenopathy. No pelvic lymphadenopathy. Reproductive: Unremarkable Other: No peritoneal metastasis Musculoskeletal: Stable sclerotic metastasis in the sacrum and RIGHT iliac bone. No new lesions. IMPRESSION: 1. Post radiation change in the RIGHT hilum. No suspicious nodules within LEFT or RIGHT lung. 2. No mediastinal lymphadenopathy. 3. No visceral metastasis in the abdomen pelvis. 4. Stable sclerotic skeletal metastasis. Electronically Signed   By: Suzy Bouchard M.D.   On: 06/22/2022 16:50   CT Abdomen Pelvis W Contrast  Result Date: 06/22/2022 CLINICAL DATA:  Non-small cell lung cancer.  * Tracking Code: BO * EXAM: CT CHEST, ABDOMEN AND  PELVIS WITHOUT CONTRAST TECHNIQUE: Multidetector CT imaging of the chest, abdomen and pelvis was performed following the standard protocol without IV contrast. RADIATION DOSE REDUCTION: This exam was performed according to the departmental dose-optimization program which includes automated exposure control, adjustment of the mA and/or kV according to patient size and/or use of iterative reconstruction technique. COMPARISON:  03/20/2022 FINDINGS: CT CHEST FINDINGS Cardiovascular: No acute cardiovascular findings. Coronary artery calcification and aortic atherosclerotic calcification. Truncation of the RIGHT upper lobe pulmonary arteries related to prior lung surgery. Mediastinum/Nodes: No axillary or supraclavicular adenopathy. No mediastinal or hilar adenopathy. No pericardial fluid. Esophagus normal. Lungs/Pleura: Linear perihilar consolidation not changed from comparison exam consistent post radiation treatment effect. No suspicious nodularity. LEFT lung is hyperexpanded. No suspicious LEFT lung nodules. Musculoskeletal: Stable sclerotic lesion at T11. CT ABDOMEN AND PELVIS FINDINGS Hepatobiliary: Tiny hypodense lesion in the LEFT hepatic lobe (image 61/2) is unchanged. No new hepatic lesions. Gallbladder normal. Pancreas: Pancreas is normal. No ductal dilatation. No pancreatic inflammation. Spleen: Normal spleen Adrenals/urinary tract: Adrenal glands and kidneys are normal. The ureters and bladder normal. Stomach/Bowel: Stomach, small bowel, appendix, and cecum are normal. The colon and rectosigmoid colon are normal. Vascular/Lymphatic: Abdominal aorta is normal caliber. There is no retroperitoneal or periportal lymphadenopathy. No pelvic lymphadenopathy. Reproductive: Unremarkable Other: No peritoneal metastasis Musculoskeletal: Stable sclerotic metastasis in the sacrum and RIGHT iliac bone. No new lesions. IMPRESSION: 1. Post radiation change in the RIGHT hilum. No suspicious nodules within LEFT or RIGHT lung.  2. No mediastinal lymphadenopathy. 3. No visceral metastasis in the abdomen pelvis. 4. Stable sclerotic skeletal metastasis. Electronically Signed   By: Suzy Bouchard M.D.   On: 06/22/2022 16:50     ASSESSMENT AND PLAN: This is a very pleasant 66 years old white male recently diagnosed with likely metastatic non-small cell lung cancer initially diagnosed as stage IIIb non-small cell lung cancer, adenocarcinoma with no actionable mutation.  He completed a course of concurrent chemoradiation with weekly carboplatin and paclitaxel status post 7 cycles.   His treatment was complicated with dysphagia, odynophagia secondary to radiation induced esophagitis.  He also has persistent cough and shortness of breath secondary to radiation-induced pneumonitis. He has partial response after this treatment. The patient completed treatment with consolidation immunotherapy with Imfinzi 1500 mg IV every 4 weeks, status post 13 cycles.  Last dose was in January 2022. The patient tolerated his previous treatment fairly well with no concerning adverse effects. The patient had evidence for disease metastasis to the left humerus as well as medial right iliac wing and sacrum in March 2023 that was biopsy-proven to be metastatic non-small cell lung cancer, adenocarcinoma. The patient received palliative radiotherapy to these areas under the care of Dr. Sondra Come.. The patient is currently on observation and he is feeling fine except for the  low back pain.  He is currently being evaluated by orthopedic surgery for potential intervention but no clear plan yet. He had repeat CT scan of the chest, abdomen and pelvis performed recently.  I personally and independently reviewed the scan and discussed the result with the patient and his wife. His scan showed no concerning findings for disease progression and he continues to have the stable sclerotic skeletal metastasis. I recommended for the patient to continue on observation with  repeat CT scan of the chest in 3 months. For the bone pain, I discussed with the patient the option of treatment with Xgeva to strengthen his bone but he will need a dental clearance.  I would like to hold on this for now. I also discussed with the patient referral to the pain clinic by Dr. Johnny Bridge in [place].  He again would like to hold on this option for now until he finishes evaluation by orthopedic surgery. A third option was referral to interventional radiology for consideration of management of his low back sclerotic lesion with ostial cool or other intervention.  The patient again would like to hold on this option until he complete his evaluation by orthopedic surgery. He was advised to call immediately if he has any other concerning symptoms in the interval. The patient voices understanding of current disease status and treatment options and is in agreement with the current care plan. All questions were answered. The patient knows to call the clinic with any problems, questions or concerns. We can certainly see the patient much sooner if necessary.  Disclaimer: This note was dictated with voice recognition software. Similar sounding words can inadvertently be transcribed and may not be corrected upon review.

## 2022-07-24 ENCOUNTER — Telehealth: Payer: Self-pay | Admitting: Medical Oncology

## 2022-07-24 NOTE — Telephone Encounter (Signed)
Orthopedic tx by Dr Skip Mayer did not work -ablation was done 2 weeks ago and the pain has returned-"it feels like an ice pick sticking out of my back".  I instructed  Alan [name] to contact Dr Skip Mayer and let him know the pain has returned.  Pt asking what is the next step   ?

## 2022-07-27 ENCOUNTER — Telehealth: Payer: Self-pay | Admitting: Medical Oncology

## 2022-07-27 NOTE — Telephone Encounter (Signed)
Per Dr Julien Nordmann I told Alan [name] that he "...can refer him to IR. Please let me know if he is ok with it".  Pt notified.   Alan [name] said he wants to wait until his next visit with [facility] before doing anything else . His pain was a 9 and it is now a 7 with current pain management .

## 2022-09-07 ENCOUNTER — Telehealth: Payer: Self-pay | Admitting: Internal Medicine

## 2022-09-07 ENCOUNTER — Telehealth: Payer: Self-pay | Admitting: *Deleted

## 2022-09-07 NOTE — Telephone Encounter (Signed)
CALLED PATIENT TO INFORM OF PET SCAN FOR 09-22-22- ARRIVAL TIME- 7:30 AM @ [facility], PATIENT TO HAVE WATER ONLY- 6 HRS. PRIOR TO TEST, PATIENT TO RECEIVE RESULTS FROM DR. KINARD ON 09-25-22 @ 10:15 AM , LVM FOR A RETURN CALL

## 2022-09-07 NOTE — Telephone Encounter (Signed)
Called patient regarding upcoming February appointments, patient is notified.

## 2022-09-15 ENCOUNTER — Encounter: Payer: Self-pay | Admitting: Physical Medicine and Rehabilitation

## 2022-09-22 ENCOUNTER — Other Ambulatory Visit: Payer: Federal, State, Local not specified - PPO

## 2022-09-22 ENCOUNTER — Ambulatory Visit (HOSPITAL_COMMUNITY)
Admission: RE | Admit: 2022-09-22 | Discharge: 2022-09-22 | Disposition: A | Discharge status: Home / Self Care | Payer: Medicare Other | Source: Ambulatory Visit | Attending: Internal Medicine | Admitting: Internal Medicine

## 2022-09-22 ENCOUNTER — Inpatient Hospital Stay: Payer: Medicare Other | Attending: Internal Medicine

## 2022-09-22 ENCOUNTER — Encounter (HOSPITAL_COMMUNITY)
Admission: RE | Admit: 2022-09-22 | Discharge: 2022-09-22 | Disposition: A | Discharge status: Home / Self Care | Payer: Medicare Other | Source: Ambulatory Visit | Attending: Radiation Oncology | Admitting: Radiation Oncology

## 2022-09-22 ENCOUNTER — Encounter (HOSPITAL_COMMUNITY): Payer: Self-pay

## 2022-09-22 DIAGNOSIS — C7951 Secondary malignant neoplasm of bone: Secondary | ICD-10-CM | POA: Insufficient documentation

## 2022-09-22 DIAGNOSIS — C3490 Malignant neoplasm of unspecified part of unspecified bronchus or lung: Secondary | ICD-10-CM

## 2022-09-22 DIAGNOSIS — C3431 Malignant neoplasm of lower lobe, right bronchus or lung: Secondary | ICD-10-CM | POA: Insufficient documentation

## 2022-09-22 LAB — CBC WITH DIFFERENTIAL (CANCER CENTER ONLY)
Abs Immature Granulocytes: 0.01 10*3/uL (ref 0.00–0.07)
Basophils Absolute: 0.1 10*3/uL (ref 0.0–0.1)
Basophils Relative: 1 %
Eosinophils Absolute: 0.2 10*3/uL (ref 0.0–0.5)
Eosinophils Relative: 4 %
HCT: 44.9 % (ref 39.0–52.0)
Hemoglobin: 15 g/dL (ref 13.0–17.0)
Immature Granulocytes: 0 %
Lymphocytes Relative: 16 %
Lymphs Abs: 0.8 10*3/uL (ref 0.7–4.0)
MCH: 32.1 pg (ref 26.0–34.0)
MCHC: 33.4 g/dL (ref 30.0–36.0)
MCV: 95.9 fL (ref 80.0–100.0)
Monocytes Absolute: 0.7 10*3/uL (ref 0.1–1.0)
Monocytes Relative: 15 %
Neutro Abs: 3.1 10*3/uL (ref 1.7–7.7)
Neutrophils Relative %: 64 %
Platelet Count: 178 10*3/uL (ref 150–400)
RBC: 4.68 MIL/uL (ref 4.22–5.81)
RDW: 12.2 % (ref 11.5–15.5)
WBC Count: 4.7 10*3/uL (ref 4.0–10.5)
nRBC: 0 % (ref 0.0–0.2)

## 2022-09-22 LAB — CMP (CANCER CENTER ONLY)
ALT: 20 U/L (ref 0–44)
AST: 21 U/L (ref 15–41)
Albumin: 4.4 g/dL (ref 3.5–5.0)
Alkaline Phosphatase: 76 U/L (ref 38–126)
Anion gap: 6 (ref 5–15)
BUN: 11 mg/dL (ref 8–23)
CO2: 29 mmol/L (ref 22–32)
Calcium: 10 mg/dL (ref 8.9–10.3)
Chloride: 107 mmol/L (ref 98–111)
Creatinine: 0.73 mg/dL (ref 0.61–1.24)
GFR, Estimated: >60 mL/min (ref >60)
Glucose, Bld: 95 mg/dL (ref 70–99)
Potassium: 4.1 mmol/L (ref 3.5–5.1)
Sodium: 142 mmol/L (ref 135–145)
Total Bilirubin: 1.1 mg/dL (ref 0.3–1.2)
Total Protein: 7.1 g/dL (ref 6.5–8.1)

## 2022-09-22 LAB — GLUCOSE, CAPILLARY: Glucose-Capillary: 101 mg/dL — ABNORMAL HIGH (ref 70–99)

## 2022-09-22 MED ORDER — IOHEXOL 300 MG/ML  SOLN
75.0000 mL | Freq: Once | INTRAMUSCULAR | Status: AC | PRN
  Administered 2022-09-22: 75 mL via INTRAVENOUS

## 2022-09-22 MED ORDER — SODIUM CHLORIDE (PF) 0.9 % IJ SOLN
INTRAMUSCULAR | Status: AC
  Filled 2022-09-22: qty 50

## 2022-09-22 MED ORDER — FLUDEOXYGLUCOSE F - 18 (FDG) INJECTION
9.3000 | Freq: Once | INTRAVENOUS | Status: AC
  Administered 2022-09-22: 8.97 via INTRAVENOUS

## 2022-09-25 ENCOUNTER — Ambulatory Visit: Payer: Self-pay | Admitting: Radiation Oncology

## 2022-09-25 NOTE — Progress Notes (Addendum)
Alan [name] is here today for follow up post radiation to the lung.  Lung Side: rtlung  Does the patient complain of any of the following: Pain States that  he has pain in his back constantly. Shortness of breath w/wo exertion: no Cough: no Hemoptysis: no Pain with swallowing: no Swallowing/choking concerns: no Appetite: good Energy Level: good Post radiation skin Changes: no issues   Vitals:   10/02/22 1006  BP: (!) 131/99  Pulse: 90  Resp: 18  SpO2: 100%  Weight: 81.4 kg    Additional comments if applicable:

## 2022-09-26 ENCOUNTER — Telehealth: Payer: Self-pay

## 2022-09-26 ENCOUNTER — Inpatient Hospital Stay (HOSPITAL_BASED_OUTPATIENT_CLINIC_OR_DEPARTMENT_OTHER): Payer: Medicare Other | Admitting: Internal Medicine

## 2022-09-26 VITALS — BP 113/79 | HR 104 | Temp 97.5°F | Resp 14 | Wt 182.0 lb

## 2022-09-26 DIAGNOSIS — C3490 Malignant neoplasm of unspecified part of unspecified bronchus or lung: Secondary | ICD-10-CM | POA: Diagnosis not present

## 2022-09-26 DIAGNOSIS — C7951 Secondary malignant neoplasm of bone: Secondary | ICD-10-CM | POA: Diagnosis not present

## 2022-09-26 DIAGNOSIS — C3431 Malignant neoplasm of lower lobe, right bronchus or lung: Secondary | ICD-10-CM | POA: Diagnosis present

## 2022-09-26 NOTE — Telephone Encounter (Signed)
Faxed over the PET scan and CT scan results as per Dr. Caryl Bis to Dr. Karle Starch. Received fax conformation.

## 2022-09-26 NOTE — Progress Notes (Signed)
[place] [phone]   [phone]  OFFICE PROGRESS NOTE  [name], L.[name], [place] [address] [employer] [address] [place] [address]  DIAGNOSIS: Suspicious metastatic non-small cell lung cancer initially diagnosed as stage IIIb (T2b, N3, M0) non-small cell lung cancer, adenocarcinoma presented with right lower lobe lung nodule in addition to right hilar mass/node as well as ipsilateral and contralateral mediastinal lymphadenopathy diagnosed in October 2020.  The patient has evidence of bone metastases on a PET scan in March 2023.  Molecular studies by [facility] showed no actionable mutations.  PRIOR THERAPY:  1) Concurrent chemoradiation with weekly carboplatin for AUC of 2 and paclitaxel 45 mg/M2.  Status post 7 cycles.  Last dose was given July 21, 2019. 2) Consolidation treatment with immunotherapy with Imfinzi 1500 mg.  Status post 13 cycles.  Last dose was given August 19, 2020. 3) palliative radiotherapy to the right iliac bone as well as the left humerus under the care of Dr. Sondra Come completed on Dec 16, 2021  CURRENT THERAPY: Observation.  INTERVAL HISTORY: Alan [name] 67 y.o. male returns to the clinic today for follow-up visit accompanied by his wife.  The patient continues to complain of the lower back pain.  He rates it as 8 on a scale from 1-10.  His pain is currently being managed by the palliative care team with the orthopedic surgery office.  He denied having any current chest pain, cough or hemoptysis.  He has no nausea, vomiting, diarrhea or constipation.  He has no headache or visual changes.  He is currently on observation and feeling fine except for the back pain.  He had repeat CT scan of the chest as well as PET scan performed recently and is here for evaluation and discussion of his scan results.    MEDICAL HISTORY: Past Medical History:  Diagnosis Date   Adenocarcinoma of right lung, stage 3 ([place]) dx'd 03/2019   CAD  S/P percutaneous coronary angioplasty 2004   (Ant STEMI) - Prox LAD 100% => 2 overlapping 3.5 x 1.8 mm Cypher DES stents.;  Patent as of August 2015   Dyslipidemia, goal LDL below 70    Essential hypertension    GERD (gastroesophageal reflux disease)    History of Mitral valve prolapse    Moderate - with moderate MR, noted February t 2013   History of radiation therapy    Right Pelvis & left humerus- 12/05/21-12/16/21- Dr. Gery Pray   History of Severe mitral regurgitation by prior echocardiogram 02/06/2014   TEE: Severe mitral regurgitation with a flail P2 segment and ruptured; Normal LV size & function, dilated LA.   Incidental lung nodule, greater than or equal to 44mm 03/16/2014   Ground glass opacity RML noted on CT scan   PONV (postoperative nausea and vomiting)    S/P Minimally Invasive MVR (mitral valve repair) 04/08/2014   Complex valvuloplasty including quadrangular resection of flail segment of posterior leaflet, sliding leaflet plasty, chordal transfer x1, Gore-tex neocord placement x4 and 34 mm Sorin Memo 3D rechord ring annuloplasty via right mini thoracotomy approach   ST elevation myocardial infarction (STEMI) of anterior wall, subsequent episode of care ([place]) 2004   he had a proximal LAD occlusion treated with 2 overlapping 3.5 x 1.8 mm Cypher DES stents.    ALLERGIES:  is allergic to a-cillin [ampicillin], amoxicillin, other, penicillins, sulfa antibiotics, and crestor [rosuvastatin].  MEDICATIONS:  Current Outpatient Medications  Medication Sig Dispense Refill   Ascorbic  Acid (VITAMIN C) 1000 MG tablet Take 2,000 mg by mouth daily.      atorvastatin (LIPITOR) 10 MG tablet Take 1 tablet (10 mg total) by mouth daily. 90 tablet 3   Coenzyme Q10 (CO Q 10) 100 MG CAPS Take 100 mg by mouth daily.      HYDROcodone-acetaminophen (NORCO/VICODIN) 5-325 MG tablet Take 1 tablet by mouth 3 (three) times daily as needed. (Patient not taking: Reported on 01/16/2022)     Hypromellose  (HYDROXYPROPYL METHYLCELLULOSE) POWD      magnesium 30 MG tablet Take 1 tablet by mouth at bedtime.     Melatonin 10 MG TABS Take 10 mg by mouth at bedtime.     naltrexone (DEPADE) 50 MG tablet Take by mouth daily. Taking 1 mg daily     Specialty Vitamins Products (PROSTATE PO) Take 1 capsule by mouth daily. PROSTATE FORMULA     vitamin B-12 (CYANOCOBALAMIN) 1000 MCG tablet Take 1,000 mcg by mouth daily.     Vitamin D-Vitamin K (D3 + K2 DOTS PO) Take 1 tablet by mouth daily.     No current facility-administered medications for this visit.    SURGICAL HISTORY:  Past Surgical History:  Procedure Laterality Date   ANTERIOR CRUCIATE LIGAMENT REPAIR Left 1993   COLONOSCOPY     INTRAOPERATIVE TRANSESOPHAGEAL ECHOCARDIOGRAM N/A 04/08/2014   Procedure: INTRAOPERATIVE TRANSESOPHAGEAL ECHOCARDIOGRAM;  Surgeon: Rexene Alberts, MD;  Location: [facility];  Service: Open Heart Surgery;  Laterality: N/A;   LEFT AND RIGHT HEART CATHETERIZATION WITH CORONARY ANGIOGRAM N/A 03/05/2014   Procedure: LEFT AND RIGHT HEART CATHETERIZATION WITH CORONARY ANGIOGRAM;  Surgeon: Leonie Man, MD;  Location: [facility] CATH LAB;  Service: Cardiovascular; (pre-op) Widely patent LAD stents, 40% distal LAD, 30% Cx, ~50% PL-PDA bifurcation lesion    LEFT HEART CATH AND CORONARY ANGIOGRAPHY  2004   In setting of anterior STEMI, found to have 100% % proximal LAD (2 DES Cypher stents)   LEFT HEART CATH AND CORONARY ANGIOGRAPHY  2007   Widely patent LAD stents, 40% distal LAD, 30% Cx, ~50% PL-PDA bifurcation lesion    LEFT HEART CATHETERIZATION WITH CORONARY ANGIOGRAM N/A 09/15/2011   Procedure: LEFT HEART CATHETERIZATION WITH CORONARY ANGIOGRAM;  Surgeon: Lorretta Harp, MD;  [facility];  Service: Cardiovascular;  Laterality: N/A;   MITRAL VALVE REPAIR Right 04/08/2014   Procedure: MINIMALLY INVASIVE MITRAL VALVE REPAIR (MVR);  Surgeon: Rexene Alberts, MD;  Location: [facility];  Service: Open Heart Surgery;  Laterality:  Right;   NM MYOVIEW LTD  12/2009   Walk 9 minutes, and 10 METs, diaphragmatic attenuation but no ischemia or infarction.   SVT ABLATION N/A 10/17/2019   Procedure: SVT ABLATION;  Surgeon: Constance Haw, MD;  Location: [facility] CV LAB;  Service: Cardiovascular;  Laterality: N/A;   TEE WITHOUT CARDIOVERSION N/A 02/06/2014   Procedure: TRANSESOPHAGEAL ECHOCARDIOGRAM (TEE);  Surgeon: Pixie Casino, MD;  Normal LV Size U& function - EF 55-60%, no regional WMA.  MV P2 Leaflet is flail with ruptured chord with severe prolapse, anterior leaflet intact.  Severe, eccentric anterior directed MR with dilated LA.   TRANSTHORACIC ECHOCARDIOGRAM  07/18/2018   Normal LV size and function.  EF 50-60 %.  Unable to assess diastolic function.  Mitral valve sewing ring in place.  Mild stenosis with a gradient of 6 mmHg noted. -   TRANSTHORACIC ECHOCARDIOGRAM  09/30/2021   Stable findings.  EF 50 to 55%.  No RWMA.  Mitral valve stable.  No significant MR.  Normal aortic valve.  Mild aortic dilation, but probably normal for age.   VIDEO BRONCHOSCOPY WITH ENDOBRONCHIAL ULTRASOUND N/A 05/16/2019   Procedure: VIDEO BRONCHOSCOPY WITH ENDOBRONCHIAL ULTRASOUND;  Surgeon: Marshell Garfinkel, MD;  Location: [facility];  Service: Pulmonary;  Laterality: N/A;    REVIEW OF SYSTEMS:  Constitutional: positive for fatigue Eyes: negative Ears, nose, mouth, throat, and face: negative Respiratory: negative Cardiovascular: negative Gastrointestinal: negative Genitourinary:negative Integument/breast: negative Hematologic/lymphatic: negative Musculoskeletal:positive for back pain Neurological: negative Behavioral/Psych: negative Endocrine: negative Allergic/Immunologic: negative   PHYSICAL EXAMINATION: General appearance: alert, cooperative, fatigued, and no distress Head: Normocephalic, without obvious abnormality, atraumatic Neck: no adenopathy, no JVD, supple, symmetrical, trachea midline, and thyroid not enlarged,  symmetric, no tenderness/mass/nodules Lymph nodes: Cervical, supraclavicular, and axillary nodes normal. Resp: clear to auscultation bilaterally Back: symmetric, no curvature. ROM normal. No CVA tenderness. Cardio: regular rate and rhythm, S1, S2 normal, no murmur, click, rub or gallop GI: soft, non-tender; bowel sounds normal; no masses,  no organomegaly Extremities: extremities normal, atraumatic, no cyanosis or edema Neurologic: Alert and oriented X 3, normal strength and tone. Normal symmetric reflexes. Normal coordination and gait  ECOG PERFORMANCE STATUS: 1 - Symptomatic but completely ambulatory  Blood pressure 113/79, pulse (!) 104, temperature (!) 97.5 F (36.4 C), temperature source Oral, resp. rate 14, weight 182 lb (82.6 kg), SpO2 97 %.  LABORATORY DATA: Lab Results  Component Value Date   WBC 4.7 09/22/2022   HGB 15.0 09/22/2022   HCT 44.9 09/22/2022   MCV 95.9 09/22/2022   PLT 178 09/22/2022      Chemistry      Component Value Date/Time   NA 142 09/22/2022 0756   NA 141 02/25/2020 0915   K 4.1 09/22/2022 0756   CL 107 09/22/2022 0756   CO2 29 09/22/2022 0756   BUN 11 09/22/2022 0756   BUN 14 02/25/2020 0915   CREATININE 0.73 09/22/2022 0756   CREATININE 0.80 02/27/2014 1152      Component Value Date/Time   CALCIUM 10.0 09/22/2022 0756   ALKPHOS 76 09/22/2022 0756   AST 21 09/22/2022 0756   ALT 20 09/22/2022 0756   BILITOT 1.1 09/22/2022 0756       RADIOGRAPHIC STUDIES: CT Chest W Contrast  Result Date: 09/22/2022 CLINICAL DATA:  Non-small cell right lung cancer with bone metastases. Interval observation. Restaging. * Tracking Code: BO * EXAM: CT CHEST WITH CONTRAST TECHNIQUE: Multidetector CT imaging of the chest was performed during intravenous contrast administration. RADIATION DOSE REDUCTION: This exam was performed according to the departmental dose-optimization program which includes automated exposure control, adjustment of the mA and/or kV  according to patient size and/or use of iterative reconstruction technique. CONTRAST:  69mL OMNIPAQUE IOHEXOL 300 MG/ML  SOLN COMPARISON:  06/22/2022 CT chest, abdomen and pelvis. 03/20/2022 PET-CT. FINDINGS: Cardiovascular: Normal heart size. No significant pericardial effusion/thickening. Mitral valve prosthesis in place. Three-vessel coronary atherosclerosis. Atherosclerotic thoracic aorta with dilated 4.1 cm ascending thoracic aorta. Normal caliber pulmonary arteries. No central pulmonary emboli. Mediastinum/Nodes: No significant thyroid nodules. Unremarkable esophagus. No pathologically enlarged axillary, mediastinal or hilar lymph nodes. Lungs/Pleura: No pneumothorax. No pleural effusion. Sharply marginated right perihilar lung consolidation with associated bronchiectasis, volume loss and distortion, unchanged, compatible with radiation fibrosis. No acute consolidative airspace disease or new significant pulmonary nodules. Upper abdomen: Subcentimeter hypodense lateral segment left liver lesion is too small to characterize and unchanged. Subcentimeter hyperdense posterior right liver lesion is too small to characterize and unchanged. Musculoskeletal: Sclerotic posterior right  T11 vertebral 2.0 cm lesion (series 7/image 149), increased from 1.4 cm on 06/22/2022 chest CT. Limited visualization of sclerotic proximal left humerus metadiaphysis lesion, incompletely evaluated. No new focal osseous lesions in the chest. IMPRESSION: 1. Sclerotic posterior right T11 vertebral lesion is increased in size since 06/22/2022 chest CT, indeterminate for delayed treatment response versus progressive metastasis. Continued PET-CT follow-up suggested. 2. Limited visualization of known sclerotic proximal left humerus metadiaphysis lesion, incompletely evaluated. 3. 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 4. No new sites of metastatic disease in the chest. 5. Three-vessel  coronary atherosclerosis. 6. Dilated 4.1 cm ascending thoracic aorta. Recommend annual imaging followup by CTA or MRA. This recommendation follows 2010 ACCF/AHA/AATS/ACR/ASA/SCA/SCAI/SIR/STS/SVM Guidelines for the Diagnosis and Management of Patients with Thoracic Aortic Disease. Circulation. 2010; 121: U440-H474. Aortic aneurysm NOS (ICD10-I71.9). 7.  Aortic Atherosclerosis (ICD10-I70.0). Electronically Signed   By: Ilona Sorrel M.D.   On: 09/22/2022 15:43   NM PET Image Restag (PS) Skull Base To Thigh  Result Date: 09/22/2022 CLINICAL DATA:  Subsequent treatment strategy for stage IV non-small cell right lung cancer with history of radiation therapy to the right ilium, left humerus and right lung. EXAM: NUCLEAR MEDICINE PET SKULL BASE TO THIGH TECHNIQUE: 9.0 mCi F-18 FDG was injected intravenously. Full-ring PET imaging was performed from the skull base to thigh after the radiotracer. CT data was obtained and used for attenuation correction and anatomic localization. Fasting blood glucose: 101 mg/dl COMPARISON:  06/22/2022 CT chest, abdomen and pelvis. 03/20/2022 PET-CT. FINDINGS: Mediastinal blood pool activity: SUV max 2.7 Liver activity: SUV max NA NECK: No hypermetabolic lymph nodes in the neck. Incidental CT findings: None. CHEST: No enlarged or hypermetabolic axillary, mediastinal or hilar lymph nodes. No hypermetabolic pulmonary findings. Stable sharply marginated right perihilar lung consolidation without associated focal hypermetabolism. Incidental CT findings: Three-vessel coronary atherosclerosis. Atherosclerotic thoracic aorta with dilated 4.2 cm ascending thoracic aorta. ABDOMEN/PELVIS: No abnormal hypermetabolic activity within the liver, pancreas, adrenal glands, or spleen. No hypermetabolic lymph nodes in the abdomen or pelvis. Incidental CT findings: Atherosclerotic nonaneurysmal abdominal aorta. SKELETON: No new focal hypermetabolic skeletal lesions. Sclerotic right T11 vertebral lesion  demonstrates max SUV 2.9, previous max SUV 2.3, not substantially changed in metabolism, mildly increased in size from 1.4 cm to 2.0 cm. Sclerotic proximal left humerus metadiaphyseal lesion is non hypermetabolic with max SUV 1.6, previous max SUV 5.4, decreased. Sclerotic medial right iliac bone lesion is non hypermetabolic with max SUV 1.4, previous max SUV 3.2, decreased. Incidental CT findings: None. IMPRESSION: 1. Mildly hypermetabolic sclerotic right Q59 vertebral lesion is mildly increased in size and not substantially changed in metabolism, warranting continued PET-CT follow-up. 2. Sclerotic lesions in the proximal left humerus and right pelvis are now non-hypermetabolic, compatible with positive response to therapy. 3. No new sites of hypermetabolic metastatic disease. 4. Three-vessel coronary atherosclerosis. 5. Dilated 4.2 cm ascending thoracic aorta. Recommend annual imaging followup by CTA or MRA. This recommendation follows 2010 ACCF/AHA/AATS/ACR/ASA/SCA/SCAI/SIR/STS/SVM Guidelines for the Diagnosis and Management of Patients with Thoracic Aortic Disease. Circulation. 2010; 121: D638-V564. Aortic aneurysm NOS (ICD10-I71.9). 6.  Aortic Atherosclerosis (ICD10-I70.0). Electronically Signed   By: Ilona Sorrel M.D.   On: 09/22/2022 15:28     ASSESSMENT AND PLAN: This is a very pleasant 67 years old white male recently diagnosed with likely metastatic non-small cell lung cancer initially diagnosed as stage IIIb non-small cell lung cancer, adenocarcinoma with no actionable mutation.  He completed a course of concurrent  chemoradiation with weekly carboplatin and paclitaxel status post 7 cycles.   His treatment was complicated with dysphagia, odynophagia secondary to radiation induced esophagitis.  He also has persistent cough and shortness of breath secondary to radiation-induced pneumonitis. He has partial response after this treatment. The patient completed treatment with consolidation immunotherapy  with Imfinzi 1500 mg IV every 4 weeks, status post 13 cycles.  Last dose was in January 2022. The patient tolerated his previous treatment fairly well with no concerning adverse effects. The patient had evidence for disease metastasis to the left humerus as well as medial right iliac wing and sacrum in March 2023 that was biopsy-proven to be metastatic non-small cell lung cancer, adenocarcinoma. The patient received palliative radiotherapy to these areas under the care of Dr. Sondra Come.. The patient is feeling fine today with no concerning complaints except for the persistent back pain. He underwent ablation by orthopedic surgery but it was not helpful. He had CT scan of the chest as well as PET scan performed recently.  Alan personally and independently reviewed the scans and discussed the result with the patient and his wife today. His scan showed no concerning findings for any new disease progression or metastasis. Alan recommended for the patient to continue on observation with repeat CT scan of the chest, abdomen and pelvis in 3 months. For the persistent back pain, Alan will refer the patient to interventional radiology for evaluation and see if there is any other option to relieve his pain. The patient was advised to call immediately if he has any other concerning symptoms in the interval. The patient voices understanding of current disease status and treatment options and is in agreement with the current care plan. All questions were answered. The patient knows to call the clinic with any problems, questions or concerns. We can certainly see the patient much sooner if necessary.  Disclaimer: This note was dictated with voice recognition software. Similar sounding words can inadvertently be transcribed and may not be corrected upon review.

## 2022-09-29 ENCOUNTER — Other Ambulatory Visit: Payer: Self-pay | Admitting: Internal Medicine

## 2022-09-29 DIAGNOSIS — C3490 Malignant neoplasm of unspecified part of unspecified bronchus or lung: Secondary | ICD-10-CM

## 2022-10-01 NOTE — Progress Notes (Signed)
Radiation Oncology         [phone]) [phone] ________________________________  Name: Alan [name] MRN: [id number]  Date: 10/02/2022  DOB: September 25, 1955  Follow-Up Visit Note  CC: [name], L.[name], MD  Curt Bears, MD  No diagnosis found.  Diagnosis: The primary encounter diagnosis was Adenocarcinoma of right lung, stage 3 ([place]). A diagnosis of Metastasis to bone [facility]) was also pertinent to this visit.   Stage IIIb (T2b, N3, M0) non-small cell lung cancer, adenocarcinoma diagnosed in 2020: s/p concurrent chemoradiation and immunotherapy. Now with new evidence of bone metastases on a PET scan in March 2023.  Interval Since Last Radiation: 9 months and 2 weeks   Intent: Palliative  Radiation Treatment Dates: 12/05/2021 through 12/16/2021 Site Technique Total Dose (Gy) Dose per Fx (Gy) Completed Fx Beam Energies  Ilium, Right: Pelvis_R 3D 30/30 3 10/10 15X  Humerus, Left: Ext_L Complex 30/30 3 10/10 10X      Radiation Treatment Dates: 06/09/2019 through 07/22/2019 Site Technique Total Dose (Gy) Dose per Fx (Gy) Completed Fx Beam Energies  Lung, Right: Lung_Rt 3D 60/60 2 30/30 6X, 15X   Narrative:  The patient returns today for routine follow-up and to review recent imaging. He was last seen here for follow-up on 03/23/22.    Pertinent imaging performed in the interval includes:  -- CT CAP with contrast on 06/22/22 which demonstrated: post radiation changes in the right hilum, no suspicious nodules in either lung, no mediastinal lymphadenopathy, no visceral metastasis in the abdomen pelvis, and stable sclerotic skeletal metastasis.     -- Chest CT with contrast on 09/22/22 showed: an increase in size of the sclerotic posterior right T11 vertebral lesion when compared to the above CT (indeterminate for delayed treatment response versus progressive metastasis), stable radiation fibrosis in the right perihilar lung without evidence of local tumor recurrence, and no new sites of metastatic  disease in the chest. Evaluation of the known sclerotic proximal left humerus metadiaphysis lesion was limited due incomplete visualization. Other findings of potential clinical significance included a dilated 4.1 cm ascending thoracic aorta, and three-vessel coronary atherosclerosis.   -- Restaging PET scan on 09/22/22 demonstrated: a slight increase in size but no substantial changed in hypermetabolism of the mildly hypermetabolic sclerotic right K81 vertebral lesion, and no residual hypermetabolism associated with the sclerotic lesions in the proximal left humerus and right pelvis, compatible with a positive treatment response. No new sites of hypermetabolic metastatic disease were appreciated.   During his most recent follow-up visit with Dr. Julien Nordmann on 09/26/22, the patient was noted to be doing well other than ongoing lower back pain rated at a 8/10 (followed by palliative care & ortho). He denied any other symptoms or concerns. For his back pain, Dr. Julien Nordmann has placed a referral to interventional radiology for further evaluation and management.   ***  Allergies:  is allergic to a-cillin [ampicillin], amoxicillin, other, penicillins, sulfa antibiotics, and crestor [rosuvastatin].  Meds: Current Outpatient Medications  Medication Sig Dispense Refill   Ascorbic Acid (VITAMIN C) 1000 MG tablet Take 2,000 mg by mouth daily.      atorvastatin (LIPITOR) 10 MG tablet Take 1 tablet (10 mg total) by mouth daily. 90 tablet 3   Coenzyme Q10 (CO Q 10) 100 MG CAPS Take 100 mg by mouth daily.      HYDROcodone-acetaminophen (NORCO/VICODIN) 5-325 MG tablet Take 1 tablet by mouth 3 (three) times daily as needed. (Patient not taking: Reported on 01/16/2022)     Hypromellose (HYDROXYPROPYL METHYLCELLULOSE) POWD  magnesium 30 MG tablet Take 1 tablet by mouth at bedtime.     Melatonin 10 MG TABS Take 10 mg by mouth at bedtime.     naltrexone (DEPADE) 50 MG tablet Take by mouth daily. Taking 1 mg daily      Specialty Vitamins Products (PROSTATE PO) Take 1 capsule by mouth daily. PROSTATE FORMULA     vitamin B-12 (CYANOCOBALAMIN) 1000 MCG tablet Take 1,000 mcg by mouth daily.     Vitamin D-Vitamin K (D3 + K2 DOTS PO) Take 1 tablet by mouth daily.     No current facility-administered medications for this encounter.    Physical Findings: The patient is in no acute distress. Patient is alert and oriented.  vitals were not taken for this visit. .  No significant changes. Lungs are clear to auscultation bilaterally. Heart has regular rate and rhythm. No palpable cervical, supraclavicular, or axillary adenopathy. Abdomen soft, non-tender, normal bowel sounds.   Lab Findings: Lab Results  Component Value Date   WBC 4.7 09/22/2022   HGB 15.0 09/22/2022   HCT 44.9 09/22/2022   MCV 95.9 09/22/2022   PLT 178 09/22/2022    Radiographic Findings: CT Chest W Contrast  Result Date: 09/22/2022 CLINICAL DATA:  Non-small cell right lung cancer with bone metastases. Interval observation. Restaging. * Tracking Code: BO * EXAM: CT CHEST WITH CONTRAST TECHNIQUE: Multidetector CT imaging of the chest was performed during intravenous contrast administration. RADIATION DOSE REDUCTION: This exam was performed according to the departmental dose-optimization program which includes automated exposure control, adjustment of the mA and/or kV according to patient size and/or use of iterative reconstruction technique. CONTRAST:  81mL OMNIPAQUE IOHEXOL 300 MG/ML  SOLN COMPARISON:  06/22/2022 CT chest, abdomen and pelvis. 03/20/2022 PET-CT. FINDINGS: Cardiovascular: Normal heart size. No significant pericardial effusion/thickening. Mitral valve prosthesis in place. Three-vessel coronary atherosclerosis. Atherosclerotic thoracic aorta with dilated 4.1 cm ascending thoracic aorta. Normal caliber pulmonary arteries. No central pulmonary emboli. Mediastinum/Nodes: No significant thyroid nodules. Unremarkable esophagus. No  pathologically enlarged axillary, mediastinal or hilar lymph nodes. Lungs/Pleura: No pneumothorax. No pleural effusion. Sharply marginated right perihilar lung consolidation with associated bronchiectasis, volume loss and distortion, unchanged, compatible with radiation fibrosis. No acute consolidative airspace disease or new significant pulmonary nodules. Upper abdomen: Subcentimeter hypodense lateral segment left liver lesion is too small to characterize and unchanged. Subcentimeter hyperdense posterior right liver lesion is too small to characterize and unchanged. Musculoskeletal: Sclerotic posterior right T11 vertebral 2.0 cm lesion (series 7/image 149), increased from 1.4 cm on 06/22/2022 chest CT. Limited visualization of sclerotic proximal left humerus metadiaphysis lesion, incompletely evaluated. No new focal osseous lesions in the chest. IMPRESSION: 1. Sclerotic posterior right T11 vertebral lesion is increased in size since 06/22/2022 chest CT, indeterminate for delayed treatment response versus progressive metastasis. Continued PET-CT follow-up suggested. 2. Limited visualization of known sclerotic proximal left humerus metadiaphysis lesion, incompletely evaluated. 3. 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 4. No new sites of metastatic disease in the chest. 5. Three-vessel coronary atherosclerosis. 6. Dilated 4.1 cm ascending thoracic aorta. Recommend annual imaging followup by CTA or MRA. This recommendation follows 2010 ACCF/AHA/AATS/ACR/ASA/SCA/SCAI/SIR/STS/SVM Guidelines for the Diagnosis and Management of Patients with Thoracic Aortic Disease. Circulation. 2010; 121: M546-T035. Aortic aneurysm NOS (ICD10-I71.9). 7.  Aortic Atherosclerosis (ICD10-I70.0). Electronically Signed   By: Ilona Sorrel M.D.   On: 09/22/2022 15:43   NM PET Image Restag (PS) Skull Base To Thigh  Result Date: 09/22/2022 CLINICAL DATA:  Subsequent treatment strategy  for stage IV non-small cell right lung cancer with history of radiation therapy to the right ilium, left humerus and right lung. EXAM: NUCLEAR MEDICINE PET SKULL BASE TO THIGH TECHNIQUE: 9.0 mCi F-18 FDG was injected intravenously. Full-ring PET imaging was performed from the skull base to thigh after the radiotracer. CT data was obtained and used for attenuation correction and anatomic localization. Fasting blood glucose: 101 mg/dl COMPARISON:  06/22/2022 CT chest, abdomen and pelvis. 03/20/2022 PET-CT. FINDINGS: Mediastinal blood pool activity: SUV max 2.7 Liver activity: SUV max NA NECK: No hypermetabolic lymph nodes in the neck. Incidental CT findings: None. CHEST: No enlarged or hypermetabolic axillary, mediastinal or hilar lymph nodes. No hypermetabolic pulmonary findings. Stable sharply marginated right perihilar lung consolidation without associated focal hypermetabolism. Incidental CT findings: Three-vessel coronary atherosclerosis. Atherosclerotic thoracic aorta with dilated 4.2 cm ascending thoracic aorta. ABDOMEN/PELVIS: No abnormal hypermetabolic activity within the liver, pancreas, adrenal glands, or spleen. No hypermetabolic lymph nodes in the abdomen or pelvis. Incidental CT findings: Atherosclerotic nonaneurysmal abdominal aorta. SKELETON: No new focal hypermetabolic skeletal lesions. Sclerotic right T11 vertebral lesion demonstrates max SUV 2.9, previous max SUV 2.3, not substantially changed in metabolism, mildly increased in size from 1.4 cm to 2.0 cm. Sclerotic proximal left humerus metadiaphyseal lesion is non hypermetabolic with max SUV 1.6, previous max SUV 5.4, decreased. Sclerotic medial right iliac bone lesion is non hypermetabolic with max SUV 1.4, previous max SUV 3.2, decreased. Incidental CT findings: None. IMPRESSION: 1. Mildly hypermetabolic sclerotic right F09 vertebral lesion is mildly increased in size and not substantially changed in metabolism, warranting continued PET-CT  follow-up. 2. Sclerotic lesions in the proximal left humerus and right pelvis are now non-hypermetabolic, compatible with positive response to therapy. 3. No new sites of hypermetabolic metastatic disease. 4. Three-vessel coronary atherosclerosis. 5. Dilated 4.2 cm ascending thoracic aorta. Recommend annual imaging followup by CTA or MRA. This recommendation follows 2010 ACCF/AHA/AATS/ACR/ASA/SCA/SCAI/SIR/STS/SVM Guidelines for the Diagnosis and Management of Patients with Thoracic Aortic Disease. Circulation. 2010; 121: N235-T732. Aortic aneurysm NOS (ICD10-I71.9). 6.  Aortic Atherosclerosis (ICD10-I70.0). Electronically Signed   By: Ilona Sorrel M.D.   On: 09/22/2022 15:28    Impression: The primary encounter diagnosis was Adenocarcinoma of right lung, stage 3 ([place]). A diagnosis of Metastasis to bone [facility]) was also pertinent to this visit.   Stage IIIb (T2b, N3, M0) non-small cell lung cancer, adenocarcinoma diagnosed in 2020: s/p concurrent chemoradiation and immunotherapy. Now with new evidence of bone metastases on a PET scan in March 2023.  The patient is recovering from the effects of radiation.  ***  Plan:  ***   *** minutes of total time was spent for this patient encounter, including preparation, face-to-face counseling with the patient and coordination of care, physical exam, and documentation of the encounter. ____________________________________  Blair Promise, PhD, MD  This document serves as a record of services personally performed by Gery Pray, MD. It was created on his behalf by Roney Mans, a trained medical scribe. The creation of this record is based on the scribe's personal observations and the provider's statements to them. This document has been checked and approved by the attending provider.

## 2022-10-02 ENCOUNTER — Ambulatory Visit
Admission: RE | Admit: 2022-10-02 | Discharge: 2022-10-02 | Disposition: A | Discharge status: Home / Self Care | Payer: Medicare Other | Source: Ambulatory Visit | Attending: Radiation Oncology | Admitting: Radiation Oncology

## 2022-10-02 ENCOUNTER — Encounter: Payer: Self-pay | Admitting: Radiation Oncology

## 2022-10-02 VITALS — BP 131/99 | HR 90 | Resp 18 | Wt 179.4 lb

## 2022-10-02 DIAGNOSIS — Z79899 Other long term (current) drug therapy: Secondary | ICD-10-CM | POA: Diagnosis not present

## 2022-10-02 DIAGNOSIS — C7951 Secondary malignant neoplasm of bone: Secondary | ICD-10-CM | POA: Diagnosis present

## 2022-10-02 DIAGNOSIS — Z923 Personal history of irradiation: Secondary | ICD-10-CM | POA: Diagnosis not present

## 2022-10-02 DIAGNOSIS — C3431 Malignant neoplasm of lower lobe, right bronchus or lung: Secondary | ICD-10-CM | POA: Diagnosis not present

## 2022-10-02 DIAGNOSIS — C3491 Malignant neoplasm of unspecified part of right bronchus or lung: Secondary | ICD-10-CM

## 2022-10-02 DIAGNOSIS — I2510 Atherosclerotic heart disease of native coronary artery without angina pectoris: Secondary | ICD-10-CM | POA: Diagnosis not present

## 2022-10-02 NOTE — Addendum Note (Signed)
Encounter addended by: Sherrlyn Hock, RN on: 10/02/2022 1:15 PM  Actions taken: Charge Capture section accepted

## 2022-10-04 ENCOUNTER — Ambulatory Visit
Admission: RE | Admit: 2022-10-04 | Discharge: 2022-10-04 | Disposition: A | Discharge status: Home / Self Care | Payer: Medicare Other | Source: Ambulatory Visit | Attending: Internal Medicine | Admitting: Internal Medicine

## 2022-10-04 DIAGNOSIS — C3490 Malignant neoplasm of unspecified part of unspecified bronchus or lung: Secondary | ICD-10-CM

## 2022-10-04 HISTORY — PX: IR RADIOLOGIST EVAL & MGMT: IMG5224

## 2022-10-04 NOTE — H&P (Signed)
Interventional Radiology - Clinic Visit, Initial H&P    Referring Provider: Curt Bears, MD  Reason for Visit: Pain osseous metastatic disease     History of Present Illness  Alan [name] is a 67 y.o. male with a relevant past medical history of right lung adenocarcinoma seen today in Interventional Radiology clinic for painful osseous metastatic disease.  The patient a history of previously treated right lung adenocarcinoma, and surveillance imaging in March of 2023 demonstrated new osseous metastatic disease to the left humerus and right iliac wing and sacrum.  Patient then underwent external beam radiation.  Patient reports that there was no meaningful pain relief following external radiation.  Most recent follow-up PET-CT in September 22, 2022 demonstrated no residual metabolic activity aforementioned bony lesions.  There was some residual uptake in a T11 vertebral body lesion.    Today, the patient presents with main complaint of right lower back pain.  He states the pain can be severe 10/10 at times, particularly when getting out of bed or bending over.  He is currently taking tramadol as needed for pain, with somewhat mild relief to 7-9/10.  He is unable to take narcotic pain medication due to constipation.  He reports significant disability on the [employer] disability questionnaire with 19/24 positive.  Additionally, the patient has been previously evaluated by orthopedic surgery, and has undergone steroid injections without any significant relief.    Additional Past Medical History Past Medical History:  Diagnosis Date   Adenocarcinoma of right lung, stage 3 ([place]) dx'd 03/2019   CAD S/P percutaneous coronary angioplasty 2004   ([place] STEMI) - Prox LAD 100% => 2 overlapping 3.5 x 1.8 mm Cypher DES stents.;  Patent as of August 2015   Dyslipidemia, goal LDL below 70    Essential hypertension    GERD (gastroesophageal reflux disease)    History of Mitral valve prolapse     Moderate - with moderate MR, noted February t 2013   History of radiation therapy    Right Pelvis & left humerus- 12/05/21-12/16/21- Dr. Gery Pray   History of Severe mitral regurgitation by prior echocardiogram 02/06/2014   TEE: Severe mitral regurgitation with a flail P2 segment and ruptured; Normal LV size & function, dilated LA.   Incidental lung nodule, greater than or equal to 48mm 03/16/2014   Ground glass opacity RML noted on CT scan   PONV (postoperative nausea and vomiting)    S/P Minimally Invasive MVR (mitral valve repair) 04/08/2014   Complex valvuloplasty including quadrangular resection of flail segment of posterior leaflet, sliding leaflet plasty, chordal transfer x1, Gore-tex neocord placement x4 and 34 mm Sorin Memo 3D rechord ring annuloplasty via right mini thoracotomy approach   ST elevation myocardial infarction (STEMI) of anterior wall, subsequent episode of care ([place]) 2004   he had a proximal LAD occlusion treated with 2 overlapping 3.5 x 1.8 mm Cypher DES stents.     Surgical History  Past Surgical History:  Procedure Laterality Date   ANTERIOR CRUCIATE LIGAMENT REPAIR Left 1993   COLONOSCOPY     INTRAOPERATIVE TRANSESOPHAGEAL ECHOCARDIOGRAM N/A 04/08/2014   Procedure: INTRAOPERATIVE TRANSESOPHAGEAL ECHOCARDIOGRAM;  Surgeon: Rexene Alberts, MD;  Location: [place];  Service: Open Heart Surgery;  Laterality: N/A;   LEFT AND RIGHT HEART CATHETERIZATION WITH CORONARY ANGIOGRAM N/A 03/05/2014   Procedure: LEFT AND RIGHT HEART CATHETERIZATION WITH CORONARY ANGIOGRAM;  Surgeon: Leonie Man, MD;  Location: [facility] CATH LAB;  Service: Cardiovascular; (pre-op) Widely patent LAD stents, 40% distal LAD,  30% Cx, ~50% PL-PDA bifurcation lesion    LEFT HEART CATH AND CORONARY ANGIOGRAPHY  2004   In setting of anterior STEMI, found to have 100% % proximal LAD (2 DES Cypher stents)   LEFT HEART CATH AND CORONARY ANGIOGRAPHY  2007   Widely patent LAD stents, 40% distal LAD, 30% Cx,  ~50% PL-PDA bifurcation lesion    LEFT HEART CATHETERIZATION WITH CORONARY ANGIOGRAM N/A 09/15/2011   Procedure: LEFT HEART CATHETERIZATION WITH CORONARY ANGIOGRAM;  Surgeon: Lorretta Harp, MD;  Location: [facility] CATH LAB;  Service: Cardiovascular;  Laterality: N/A;   MITRAL VALVE REPAIR Right 04/08/2014   Procedure: MINIMALLY INVASIVE MITRAL VALVE REPAIR (MVR);  Surgeon: Rexene Alberts, MD;  Location: [place];  Service: Open Heart Surgery;  Laterality: Right;   NM MYOVIEW LTD  12/2009   Walk 9 minutes, and 10 METs, diaphragmatic attenuation but no ischemia or infarction.   SVT ABLATION N/A 10/17/2019   Procedure: SVT ABLATION;  Surgeon: Constance Haw, MD;  Location: [place] CV LAB;  Service: Cardiovascular;  Laterality: N/A;   TEE WITHOUT CARDIOVERSION N/A 02/06/2014   Procedure: TRANSESOPHAGEAL ECHOCARDIOGRAM (TEE);  Surgeon: Pixie Casino, MD;  Normal LV Size U& function - EF 55-60%, no regional WMA.  MV P2 Leaflet is flail with ruptured chord with severe prolapse, anterior leaflet intact.  Severe, eccentric anterior directed MR with dilated LA.   TRANSTHORACIC ECHOCARDIOGRAM  07/18/2018   Normal LV size and function.  EF 50-60 %.  Unable to assess diastolic function.  Mitral valve sewing ring in place.  Mild stenosis with a gradient of 6 mmHg noted. -   TRANSTHORACIC ECHOCARDIOGRAM  09/30/2021   Stable findings.  EF 50 to 55%.  No RWMA.  Mitral valve stable.  No significant MR.  Normal aortic valve.  Mild aortic dilation, but probably normal for age.   VIDEO BRONCHOSCOPY WITH ENDOBRONCHIAL ULTRASOUND N/A 05/16/2019   Procedure: VIDEO BRONCHOSCOPY WITH ENDOBRONCHIAL ULTRASOUND;  Surgeon: Marshell Garfinkel, MD;  Location: [place];  Service: Pulmonary;  Laterality: N/A;     Medications  I have reviewed the current medication list. Refer to chart for details. Current Outpatient Medications  Medication Instructions   atorvastatin (LIPITOR) 10 mg, Oral, Daily   Co Q 10 100 mg, Daily    cyanocobalamin (VITAMIN B12) 1,000 mcg, Oral, Daily   HYDROcodone-acetaminophen (NORCO/VICODIN) 5-325 MG tablet 1 tablet, 3 times daily PRN   Hypromellose (HYDROXYPROPYL METHYLCELLULOSE) POWD No dose, route, or frequency recorded.   magnesium 30 MG tablet 1 tablet, Oral, Daily at bedtime   Melatonin 10 mg, Oral, Daily at bedtime   naltrexone (DEPADE) 50 MG tablet Daily   Specialty Vitamins Products (PROSTATE PO) 1 capsule, Oral, Daily, PROSTATE FORMULA    vitamin C 2,000 mg, Oral, Daily   Vitamin D-Vitamin K (D3 + K2 DOTS PO) 1 tablet, Oral, Daily      Allergies Allergies  Allergen Reactions   A-Cillin [Ampicillin] Shortness Of Breath, Swelling and Rash    Did it involve swelling of the face/tongue/throat, SOB, or low BP? Yes Did it involve sudden or severe rash/hives, skin peeling, or any reaction on the inside of your mouth or nose? Yes Did you need to seek medical attention at a hospital or doctor's office? Yes When did it last happen? ~20 years ago If all above answers are "NO", may proceed with cephalosporin use.     Amoxicillin Hives, Shortness Of Breath and Swelling    Did it involve swelling of the face/tongue/throat,  SOB, or low BP? Yes Did it involve sudden or severe rash/hives, skin peeling, or any reaction on the inside of your mouth or nose? Yes Did you need to seek medical attention at a hospital or doctor's office? Yes When did it last happen? ~20 years ago If all above answers are "NO", may proceed with cephalosporin use.    Other Hives, Shortness Of Breath and Swelling    ALL "CILLINS"   Penicillins Hives, Shortness Of Breath and Swelling    Did it involve swelling of the face/tongue/throat, SOB, or low BP? Yes Did it involve sudden or severe rash/hives, skin peeling, or any reaction on the inside of your mouth or nose? Yes Did you need to seek medical attention at a hospital or doctor's office? Yes When did it last happen? ~20 years ago If all above answers are  "NO", may proceed with cephalosporin use.     Sulfa Antibiotics Nausea Only   Crestor [Rosuvastatin] Other (See Comments)    G I upset   Does patient have contrast allergy: No     Physical Exam Current Vitals Temp: 97.8 F (36.6 C) ( )  Pulse Rate: (!) 105  Resp: 16  BP: 125/85  SpO2: 96 %        There is no height or weight on file to calculate BMI.  General: Alert and answers questions appropriately.  HEENT: Normocephalic, atraumatic.  Cardiac: dependent edema. Pulmonary: Normal work of breathing. On room air. Abdominal: Soft without distension. Back: Tenderness to palpation over right posterior ilium    Pertinent Lab Results    Latest Ref Rng & Units 09/22/2022    7:56 AM 06/22/2022    2:31 PM 02/21/2022    9:57 AM  CBC  WBC 4.0 - 10.5 K/uL 4.7  5.2  4.4   Hemoglobin 13.0 - 17.0 g/dL 15.0  13.5  14.3   Hematocrit 39.0 - 52.0 % 44.9  39.4  41.3   Platelets 150 - 400 K/uL 178  166  153       Latest Ref Rng & Units 09/22/2022    7:56 AM 06/22/2022    2:31 PM 02/21/2022    9:57 AM  CMP  Glucose 70 - 99 mg/dL 95  91  100   BUN 8 - 23 mg/dL 11  16  18    Creatinine 0.61 - 1.24 mg/dL 0.73  0.79  0.78   Sodium 135 - 145 mmol/L 142  142  142   Potassium 3.5 - 5.1 mmol/L 4.1  4.0  4.1   Chloride 98 - 111 mmol/L 107  107  108   CO2 22 - 32 mmol/L 29  28  30    Calcium 8.9 - 10.3 mg/dL 10.0  9.5  9.7   Total Protein 6.5 - 8.1 g/dL 7.1  6.6  6.8   Total Bilirubin 0.3 - 1.2 mg/dL 1.1  0.8  1.2   Alkaline Phos 38 - 126 U/L 76  76  66   AST 15 - 41 U/L 21  27  22    ALT 0 - 44 U/L 20  25  25        Relevant and/or Recent Imaging: As documented in the HPI    Assessment & Plan Alan [name] is a 67 y.o. male with a history of right lung adenocarcinoma who was referred to IR Clinic by Dr. Julien Nordmann in consultation for further evaluation and management of painful osseous metastatic disease.  Based on the patient's history, physical exam,  and imaging, which were notable for  right iliac osseous disease with pain refractory to external beam radiation and not well-controlled on prescription [pain medication, we believe he would be a good candidate for radiofrequency ablation (RFA) of the right posterior iliac bone lesion for palliative pain control.   He has failed pain relief measures including prescription pain medication, external radiation, and steroid injections.   His pain may be due to combination of low-grade tumor activity (under detection of PET CT) and/or periosteal reaction from previous tumor growth. Both of these could be addressed with RFA.   I discussed that size of lesion and lack of pain relief from previous external radiation (despite a good treatment response by PET CT) may decrease success. However, he may be a candidate for multiple treatment session if he gets partial relief.    These were discussed with the patient and his wife. We discussed the details of the procedure and its risks and benefits to the patient, who voiced his understanding, and was agreeable to proceed.    Plan:  CT guided RFA of right posterior ilium; plan for long-axis approach with parallel probes    Pre-procedure planning:  Post-procedure disposition: outpatient [facility] vs [facility]   Sedation plan: fentanyl and midazolam Medication holds: none   Positioning/access site: prone, right ilium   Other: [employer]     Total time spent on today's visit was over 75 Minutes, including both face-to-face time and non face-to-face time, personally spent on review of chart (including labs and relevant imaging), discussing further workup and treatment options, referral to specialist if needed, reviewing outside records if pertinent, answering patient questions, and coordinating care regarding right iliac osseous disease as well as management strategy.      Albin Felling, MD  Vascular and Interventional Radiology 10/04/2022 4:44 PM

## 2022-10-05 ENCOUNTER — Encounter
Payer: Medicare Other | Attending: Physical Medicine and Rehabilitation | Admitting: Physical Medicine and Rehabilitation

## 2022-10-05 ENCOUNTER — Encounter: Payer: Self-pay | Admitting: Physical Medicine and Rehabilitation

## 2022-10-05 VITALS — Ht <= 58 in | Wt 179.0 lb

## 2022-10-05 DIAGNOSIS — M25551 Pain in right hip: Secondary | ICD-10-CM

## 2022-10-05 DIAGNOSIS — C7951 Secondary malignant neoplasm of bone: Secondary | ICD-10-CM | POA: Insufficient documentation

## 2022-10-05 NOTE — Patient Instructions (Signed)
Foods that may reduce pain: 1) Ginger (especially studied for arthritis)- reduce leukotriene production to decrease inflammation 2) Blueberries- high in phytonutrients that decrease inflammation 3) Salmon- marine omega-3s reduce joint swelling and pain 4) Pumpkin seeds- reduce inflammation 5) dark chocolate- reduces inflammation 6) turmeric- reduces inflammation 7) tart cherries - reduce pain and stiffness 8) extra virgin olive oil - its compound olecanthal helps to block prostaglandins  9) chili peppers- can be eaten or applied topically via capsaicin 10) mint- helpful for headache, muscle aches, joint pain, and itching 11) garlic- reduces inflammation  Link to further information on diet for chronic pain: [email or web address]

## 2022-10-05 NOTE — Progress Notes (Signed)
Subjective:    Patient ID: Alan [name], male    DOB: February 09, 1956, 67 y.o.   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 General activity 6 Relation with others 6 Enjoyment of life 6 What TIME of day is your pain at its worst? morning  and daytime Sleep (in general) Fair  Pain is worse with: walking, bending, sitting, and standing Pain improves with: medication and injections Relief from Meds: 4  ability to climb steps?  yes do you drive?  yes Do you have any goals in this area?  yes  retired  weakness  Any changes since last visit?  yes CT/MRI in [place]  Any changes since last visit?  yes    Family History  Problem Relation Age of Onset   Hypertension Mother    Heart Problems Father        triple bypass 1989   Cancer Paternal Grandmother        Pancreatic   Social History   Socioeconomic History   Marital status: Married    Spouse name: Not on file   Number of children: Not on file   Years of education: Not on file   Highest education level: Not on file  Occupational History   Not on file  Tobacco Use   Smoking status: Never   Smokeless tobacco: Never  Vaping Use   Vaping Use: Never used  Substance and Sexual Activity   Alcohol use: Yes    Alcohol/week: 10.0 standard drinks of alcohol    Types: 5 Cans of beer, 5 Shots of liquor per week    Comment: social    Drug use: No   Sexual activity: Not on file  Other Topics Concern   Not on file  Social History Narrative   He is a married father of one. Exercises avidly as noted above - runs routinely at least 3 miles 3-4 times a day.  He drinks his Xango fruit juce - 32 Oz. Daily.     Never smoked and only takes occasional alcohol   Social Determinants of Health   Financial Resource Strain: Not on file  Food Insecurity: Not on file  Transportation Needs: Not on file  Physical Activity: Not on file  Stress: Not on file  Social Connections: Not on file   Past Surgical History:  Procedure Laterality Date   [place] TRANSESOPHAGEAL ECHOCARDIOGRAM N/A 04/08/2014   Procedure: INTRAOPERATIVE TRANSESOPHAGEAL ECHOCARDIOGRAM;  Surgeon: Rexene Alberts, MD;  Location: [place];  Service: Open Heart Surgery;  Laterality: N/A;   IR RADIOLOGIST EVAL & MGMT  10/04/2022   LEFT AND RIGHT HEART CATHETERIZATION WITH CORONARY ANGIOGRAM N/A 03/05/2014   Procedure: LEFT AND RIGHT HEART CATHETERIZATION WITH CORONARY ANGIOGRAM;  Surgeon: Leonie Man, MD;  [facility] C[facility] LAB;  Service: Cardiovascular; (  pre-op) Widely patent LAD stents, 40% distal LAD, 30% Cx, ~50% PL-PDA bifurcation lesion    LEFT HEART CATH AND CORONARY ANGIOGRAPHY  2004   In setting of anterior STEMI, found to have 100% % proximal LAD (2 DES Cypher stents)   LEFT HEART CATH AND CORONARY ANGIOGRAPHY  2007   Widely patent LAD stents, 40% distal LAD, 30% Cx, ~50% PL-PDA bifurcation lesion    LEFT HEART CATHETERIZATION WITH CORONARY ANGIOGRAM N/A 09/15/2011   Procedure: LEFT HEART CATHETERIZATION WITH CORONARY ANGIOGRAM;  Surgeon: Lorretta Harp, MD;  Location: [facility] CATH LAB;  Service: Cardiovascular;  Laterality: N/A;   MITRAL VALVE REPAIR Right 04/08/2014   Procedure: MINIMALLY INVASIVE MITRAL VALVE REPAIR (MVR);  Surgeon: Rexene Alberts, MD;  Location: [place];  Service: Open  Heart Surgery;  Laterality: Right;   NM MYOVIEW LTD  12/2009   Walk 9 minutes, and 10 METs, diaphragmatic attenuation but no ischemia or infarction.   SVT ABLATION N/A 10/17/2019   Procedure: SVT ABLATION;  Surgeon: Constance Haw, MD;  Location: [place] CV LAB;  Service: Cardiovascular;  Laterality: N/A;   TEE WITHOUT CARDIOVERSION N/A 02/06/2014   Procedure: TRANSESOPHAGEAL ECHOCARDIOGRAM (TEE);  Surgeon: Pixie Casino, MD;  Normal LV Size U& function - EF 55-60%, no regional WMA.  MV P2 Leaflet is flail with ruptured chord with severe prolapse, anterior leaflet intact.  Severe, eccentric anterior directed MR with dilated LA.   TRANSTHORACIC ECHOCARDIOGRAM  07/18/2018   Normal LV size and function.  EF 50-60 %.  Unable to assess diastolic function.  Mitral valve sewing ring in place.  Mild stenosis with a gradient of 6 mmHg noted. -   TRANSTHORACIC ECHOCARDIOGRAM  09/30/2021   Stable findings.  EF 50 to 55%.  No RWMA.  Mitral valve stable.  No significant MR.  Normal aortic valve.  Mild aortic dilation, but probably normal for age.   VIDEO BRONCHOSCOPY WITH ENDOBRONCHIAL ULTRASOUND N/A 05/16/2019   Procedure: VIDEO BRONCHOSCOPY WITH ENDOBRONCHIAL ULTRASOUND;  Surgeon: Marshell Garfinkel, MD;  Location: [place];  Service: Pulmonary;  Laterality: N/A;   Past Medical History:  Diagnosis Date   Adenocarcinoma of right lung, stage 3 ([place]) dx'd 03/2019   CAD S/P percutaneous coronary angioplasty 2004   (Ant STEMI) - Prox LAD 100% => 2 overlapping 3.5 x 1.8 mm Cypher DES stents.;  Patent as of August 2015   Dyslipidemia, goal LDL below 70    Essential hypertension    GERD (gastroesophageal reflux disease)    History of Mitral valve prolapse    Moderate - with moderate MR, noted February t 2013   History of radiation therapy    Right Pelvis & left humerus- 12/05/21-12/16/21- Dr. Gery Pray   History of Severe mitral regurgitation by prior echocardiogram 02/06/2014   TEE: Severe mitral  regurgitation with a flail P2 segment and ruptured; Normal LV size & function, dilated LA.   Incidental lung nodule, greater than or equal to 25mm 03/16/2014   Ground glass opacity RML noted on CT scan   PONV (postoperative nausea and vomiting)    S/P Minimally Invasive MVR (mitral valve repair) 04/08/2014   Complex valvuloplasty including quadrangular resection of flail segment of posterior leaflet, sliding leaflet plasty, chordal transfer x1, Gore-tex neocord placement x4 and 34 mm Sorin Memo 3D rechord ring annuloplasty via right mini thoracotomy approach   ST elevation myocardial infarction (STEMI) of anterior wall, subsequent episode of care ([place]) 2004   he had a proximal LAD occlusion treated  with 2 overlapping 3.5 x 1.8 mm Cypher DES stents.   Ht 1' (0.305 m)   Wt 179 lb (81.2 kg)   BMI 873.96 kg/m   Opioid Risk Score:   Fall Risk Score:  `1  Depression screen [facility] 2/9     10/05/2022    9:19 AM  Depression screen PHQ 2/9  Decreased Interest 0  Down, Depressed, Hopeless 0  PHQ - 2 Score 0  Altered sleeping 1  Tired, decreased energy 1  Change in appetite 1  Feeling bad or failure about yourself  0  Trouble concentrating 0  Moving slowly or fidgety/restless 0  Suicidal thoughts 0  PHQ-9 Score 3    Review of Systems  Musculoskeletal:        Right upper buttock cheek area  All other systems reviewed and are negative.      Objective:   Physical Exam Gen: no distress, normal appearing HEENT: oral mucosa pink and moist, NCAT Cardio: Reg rate Chest: normal effort, normal rate of breathing Abd: soft, non-distended Ext: no edema Psych: pleasant, normal affect Skin: intact Neuro: Alert and oriented x3       Assessment & Plan:   1) Right hip pain secondary to metastatic tumor -continue tramadol -continue salonpas -discussed hyperbaric oxygen therapy -discussed SPRINT PNS -continue aleve prn, discussed that he is not getting any GI side effects -discussing  radiofrequency cooling by interventional radiology -Discussed current symptoms of pain and history of pain.  -Discussed benefits of exercise in reducing pain. -Discussed following foods that may reduce pain: 1) Ginger (especially studied for arthritis)- reduce leukotriene production to decrease inflammation 2) Blueberries- high in phytonutrients that decrease inflammation 3) Salmon- marine omega-3s reduce joint swelling and pain 4) Pumpkin seeds- reduce inflammation 5) dark chocolate- reduces inflammation 6) turmeric- reduces inflammation 7) tart cherries - reduce pain and stiffness 8) extra virgin olive oil - its compound olecanthal helps to block prostaglandins  9) chili peppers- can be eaten or applied topically via capsaicin 10) mint- helpful for headache, muscle aches, joint pain, and itching 11) garlic- reduces inflammation  Link to further information on diet for chronic pain: [email or web address]    2) Metastatic adenocarcinoma of the lung:: -recommended low-glucose diet.  -discussed the benefits of maintaining good muscle mass

## 2022-10-10 ENCOUNTER — Other Ambulatory Visit: Payer: Self-pay | Admitting: Interventional Radiology

## 2022-10-10 DIAGNOSIS — M899 Disorder of bone, unspecified: Secondary | ICD-10-CM

## 2022-10-13 NOTE — Progress Notes (Signed)
Patient for CT guided Iliac bone lesion ablation RFA w/Osteocool on Mon 10/16/2022, I called and spoke with the patient on the phone and gave pre-procedure instructions. Pt was made aware to be here at 9:30a, NPO after MN prior to procedure as well as driver post procedure/recovery/discharge. Pt stated understanding. Called 10/10/2022 and 10/13/2022

## 2022-10-15 ENCOUNTER — Other Ambulatory Visit: Payer: Self-pay | Admitting: Student

## 2022-10-15 ENCOUNTER — Other Ambulatory Visit: Payer: Self-pay | Admitting: Radiology

## 2022-10-15 DIAGNOSIS — M899 Disorder of bone, unspecified: Secondary | ICD-10-CM

## 2022-10-16 ENCOUNTER — Other Ambulatory Visit: Payer: Self-pay

## 2022-10-16 ENCOUNTER — Ambulatory Visit
Admission: RE | Admit: 2022-10-16 | Discharge: 2022-10-16 | Disposition: A | Discharge status: Home / Self Care | Payer: Medicare Other | Source: Ambulatory Visit | Attending: Interventional Radiology | Admitting: Interventional Radiology

## 2022-10-16 DIAGNOSIS — I252 Old myocardial infarction: Secondary | ICD-10-CM | POA: Diagnosis not present

## 2022-10-16 DIAGNOSIS — C7951 Secondary malignant neoplasm of bone: Secondary | ICD-10-CM | POA: Insufficient documentation

## 2022-10-16 DIAGNOSIS — Z9889 Other specified postprocedural states: Secondary | ICD-10-CM | POA: Diagnosis not present

## 2022-10-16 DIAGNOSIS — C3491 Malignant neoplasm of unspecified part of right bronchus or lung: Secondary | ICD-10-CM | POA: Insufficient documentation

## 2022-10-16 DIAGNOSIS — M899 Disorder of bone, unspecified: Secondary | ICD-10-CM

## 2022-10-16 LAB — CBC
HCT: 42.5 % (ref 39.0–52.0)
Hemoglobin: 14 g/dL (ref 13.0–17.0)
MCH: 31.5 pg (ref 26.0–34.0)
MCHC: 32.9 g/dL (ref 30.0–36.0)
MCV: 95.7 fL (ref 80.0–100.0)
Platelets: 177 10*3/uL (ref 150–400)
RBC: 4.44 MIL/uL (ref 4.22–5.81)
RDW: 12.3 % (ref 11.5–15.5)
WBC: 3.4 10*3/uL — ABNORMAL LOW (ref 4.0–10.5)
nRBC: 0 % (ref 0.0–0.2)

## 2022-10-16 LAB — BASIC METABOLIC PANEL
Anion gap: 7 (ref 5–15)
BUN: 16 mg/dL (ref 8–23)
CO2: 27 mmol/L (ref 22–32)
Calcium: 9.6 mg/dL (ref 8.9–10.3)
Chloride: 107 mmol/L (ref 98–111)
Creatinine, Ser: 0.65 mg/dL (ref 0.61–1.24)
GFR, Estimated: >60 mL/min (ref >60)
Glucose, Bld: 99 mg/dL (ref 70–99)
Potassium: 3.9 mmol/L (ref 3.5–5.1)
Sodium: 141 mmol/L (ref 135–145)

## 2022-10-16 LAB — PROTIME-INR
INR: 1 (ref 0.8–1.2)
Prothrombin Time: 12.8 seconds (ref 11.4–15.2)

## 2022-10-16 MED ORDER — VANCOMYCIN HCL 500 MG/100ML IV SOLN
INTRAVENOUS | Status: DC | PRN
  Administered 2022-10-16: 1000 mg via INTRAVENOUS

## 2022-10-16 MED ORDER — MIDAZOLAM HCL 5 MG/5ML IJ SOLN
INTRAMUSCULAR | Status: DC | PRN
  Administered 2022-10-16 (×6): .5 mg via INTRAVENOUS
  Administered 2022-10-16: 1 mg via INTRAVENOUS

## 2022-10-16 MED ORDER — MIDAZOLAM HCL 2 MG/2ML IJ SOLN
INTRAMUSCULAR | Status: AC
  Filled 2022-10-16: qty 2

## 2022-10-16 MED ORDER — FENTANYL CITRATE (PF) 100 MCG/2ML IJ SOLN
INTRAMUSCULAR | Status: AC
  Filled 2022-10-16: qty 2

## 2022-10-16 MED ORDER — SODIUM CHLORIDE 0.9 % IV SOLN: INTRAVENOUS | Status: DC

## 2022-10-16 MED ORDER — VANCOMYCIN HCL IN DEXTROSE 1-5 GM/200ML-% IV SOLN
1000.0000 mg | INTRAVENOUS | Status: DC
  Filled 2022-10-16: qty 200

## 2022-10-16 MED ORDER — VANCOMYCIN HCL IN DEXTROSE 1-5 GM/200ML-% IV SOLN
INTRAVENOUS | Status: AC
  Filled 2022-10-16: qty 200

## 2022-10-16 MED ORDER — FENTANYL CITRATE (PF) 100 MCG/2ML IJ SOLN
INTRAMUSCULAR | Status: DC | PRN
  Administered 2022-10-16 (×6): 25 ug via INTRAVENOUS
  Administered 2022-10-16: 50 ug via INTRAVENOUS

## 2022-10-16 MED ORDER — LIDOCAINE HCL 1 % IJ SOLN
INTRAMUSCULAR | Status: AC
  Administered 2022-10-16: 10 mL
  Filled 2022-10-16: qty 20

## 2022-10-16 NOTE — Procedures (Signed)
Interventional Radiology Procedure Note  Date of Procedure: 10/16/2022  Procedure: CT bone ablation   Findings:  1. CT bone RFA of right iliac sclerotic lesion; Osteocool 20 mm probe, single, 15 min x3 sites at [id number]    Complications: No immediate complications noted.   Estimated Blood Loss: minimal  Follow-up and Recommendations: 1. Bedrest 1 hour    Albin Felling, MD  Vascular & Interventional Radiology  10/16/2022 1:37 PM

## 2022-10-16 NOTE — H&P (Signed)
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 male with PMH significant for adenocarcinoma of right lung, hypertension, and history of STEMI being seen today for image-guided right posterior iliac bone lesion ablation. The patient has been experiencing significant pain in his right lower back that is 10/10 pain, particularly with movement of his back or bending. He reports significant effect on his activities of daily living due to the pain. Patient's recent PET-CT from 09/22/22 demonstrated no residual metabolic activity in his bone lesions. Pain medication has only brought mild relief and reduces his pain to a range of 7/10 to 9/10 pain levels. Following consultation with Dr Denna Haggard, patient was subsequently scheduled for image-guided right iliac bone lesion radiofrequency ablation procedure with Osteocool.  Past Medical History:  Diagnosis Date   Adenocarcinoma of right lung, stage 3 ([place]) dx'd 03/2019   CAD S/P percutaneous coronary angioplasty 2004   (Ant STEMI) - Prox LAD 100% => 2 overlapping 3.5 x 1.8 mm Cypher DES stents.;  Patent as of August 2015   Dyslipidemia, goal LDL below 70    Essential hypertension    GERD (gastroesophageal reflux disease)    History of Mitral valve prolapse    Moderate - with moderate MR, noted February t 2013   History of radiation therapy    Right Pelvis & left humerus- 12/05/21-12/16/21- Dr. Gery Pray   History of Severe mitral regurgitation by prior echocardiogram 02/06/2014   TEE: Severe mitral regurgitation with a flail P2 segment and ruptured; Normal LV size & function, dilated LA.   Incidental lung nodule, greater than or equal to 71m 03/16/2014   Ground glass opacity RML noted on CT scan   PONV  (postoperative nausea and vomiting)    S/P Minimally Invasive MVR (mitral valve repair) 04/08/2014   Complex valvuloplasty including quadrangular resection of flail segment of posterior leaflet, sliding leaflet plasty, chordal transfer x1, Gore-tex neocord placement x4 and 34 mm Sorin Memo 3D rechord ring annuloplasty via right mini thoracotomy approach   ST elevation myocardial infarction (STEMI) of anterior wall, subsequent episode of care [place] [place]   he had a proximal LAD occlusion treated with 2 overlapping 3.5 x 1.8 mm Cypher DES stents.    Past Surgical History:  Procedure Laterality Date   ANTERIOR CRUCIATE LIGAMENT REPAIR Left 1993   COLONOSCOPY     INTRAOPERATIVE TRANSESOPHAGEAL ECHOCARDIOGRAM N/A 04/08/2014   Procedure: INTRAOPERATIVE TRANSESOPHAGEAL ECHOCARDIOGRAM;  Surgeon: CRexene Alberts MD;  [place] [facility]  Service: Open Heart Surgery;  Laterality: N/A;   IR RADIOLOGIST EVAL & MGMT  10/04/2022   LEFT AND RIGHT HEART CATHETERIZATION WITH CORONARY ANGIOGRAM N/A 03/05/2014   Procedure: LEFT AND RIGHT HEART CATHETERIZATION WITH CORONARY ANGIOGRAM;  Surgeon: DLeonie Man MD;  [facility];  Service: Cardiovascular; (pre-op) Widely patent LAD stents, 40% distal LAD, 30% Cx, ~50% PL-PDA bifurcation lesion    LEFT HEART CATH AND CORONARY ANGIOGRAPHY  2004   In setting of anterior STEMI, found to have 100% % proximal LAD (2 DES Cypher stents)   LEFT HEART CATH AND CORONARY ANGIOGRAPHY  2007   Widely patent LAD stents, 40% distal LAD, 30% Cx, ~50% PL-PDA bifurcation lesion    LEFT HEART CATHETERIZATION WITH CORONARY ANGIOGRAM N/A 09/15/2011   Procedure: LEFT HEART CATHETERIZATION WITH  CORONARY ANGIOGRAM;  Surgeon: Lorretta Harp, MD;  Location[facility] CATH LAB;  Service: Cardiovascular;  Laterality: N/A;   MITRAL VALVE REPAIR Right 04/08/2014   Procedure: MINIMALLY INVASIVE MITRAL VALVE REPAIR (MVR);  Surgeon: Rexene Alberts, MD;  Location: [facility];  Service: Open Heart  Surgery;  Laterality: Right;   NM MYOVIEW LTD  12/2009   Walk 9 minutes, and 10 METs, diaphragmatic attenuation but no ischemia or infarction.   SVT ABLATION N/A 10/17/2019   Procedure: SVT ABLATION;  Surgeon: Constance Haw, MD;  Location: [facility] CV LAB;  Service: Cardiovascular;  Laterality: N/A;   TEE WITHOUT CARDIOVERSION N/A 02/06/2014   Procedure: TRANSESOPHAGEAL ECHOCARDIOGRAM (TEE);  Surgeon: Pixie Casino, MD;  Normal LV Size U& function - EF 55-60%, no regional WMA.  MV P2 Leaflet is flail with ruptured chord with severe prolapse, anterior leaflet intact.  Severe, eccentric anterior directed MR with dilated LA.   TRANSTHORACIC ECHOCARDIOGRAM  07/18/2018   Normal LV size and function.  EF 50-60 %.  Unable to assess diastolic function.  Mitral valve sewing ring in place.  Mild stenosis with a gradient of 6 mmHg noted. -   TRANSTHORACIC ECHOCARDIOGRAM  09/30/2021   Stable findings.  EF 50 to 55%.  No RWMA.  Mitral valve stable.  No significant MR.  Normal aortic valve.  Mild aortic dilation, but probably normal for age.   VIDEO BRONCHOSCOPY WITH ENDOBRONCHIAL ULTRASOUND N/A 05/16/2019   Procedure: VIDEO BRONCHOSCOPY WITH ENDOBRONCHIAL ULTRASOUND;  Surgeon: Marshell Garfinkel, MD;  Location: [facility];  Service: Pulmonary;  Laterality: N/A;    Allergies: A-cillin [ampicillin], Amoxicillin, Other, Penicillins, Sulfa antibiotics, and Crestor [rosuvastatin]  Medications: Prior to Admission medications   Medication Sig Start Date End Date Taking? Authorizing Provider  Ascorbic Acid (VITAMIN C) 1000 MG tablet Take 2,000 mg by mouth daily.     [provider]  atorvastatin (LIPITOR) 10 MG tablet Take 1 tablet (10 mg total) by mouth daily. 12/19/21 12/14/22  Leonie Man, MD  Coenzyme Q10 (CO Q 10) 100 MG CAPS Take 100 mg by mouth daily.     [provider]  Hypromellose (HYDROXYPROPYL METHYLCELLULOSE) POWD  07/02/20   [provider]  magnesium 30 MG tablet  Take 1 tablet by mouth at bedtime.    [provider]  Melatonin 10 MG TABS Take 10 mg by mouth at bedtime.    [provider]  naltrexone (DEPADE) 50 MG tablet Take by mouth daily. Taking 1 mg daily    [provider]  Specialty Vitamins Products (PROSTATE PO) Take 1 capsule by mouth daily. PROSTATE FORMULA    [provider]  traMADol (ULTRAM) 50 MG tablet Take 50 mg by mouth 3 (three) times daily as needed. 10/03/22   [provider]  vitamin B-12 (CYANOCOBALAMIN) 1000 MCG tablet Take 1,000 mcg by mouth daily.    [provider]  Vitamin D-Vitamin K (D3 + K2 DOTS PO) Take 1 tablet by mouth daily.    [provider]     Family History  Problem Relation Age of Onset   Hypertension Mother    Heart Problems Father        triple bypass 1989   Cancer Paternal Grandmother        Pancreatic    Social History   Socioeconomic History   Marital status: Married    Spouse name: Not on file   Number of children: Not on file   Years of education:  Not on file   Highest education level: Not on file  Occupational History   Not on file  Tobacco Use   Smoking status: Never   Smokeless tobacco: Never  Vaping Use   Vaping Use: Never used  Substance and Sexual Activity   Alcohol use: Yes    Alcohol/week: 10.0 standard drinks of alcohol    Types: 5 Cans of beer, 5 Shots of liquor per week    Comment: social   Drug use: No   Sexual activity: Not on file  Other Topics Concern   Not on file  Social History Narrative   He is a married father of one. Exercises avidly as noted above - runs routinely at least 3 miles 3-4 times a day.  He drinks his Xango fruit juce - 32 Oz. Daily.     Never smoked and only takes occasional alcohol   Social Determinants of Health   Financial Resource Strain: Not on file  Food Insecurity: Not on file  Transportation Needs: Not on file  Physical Activity: Not on file  Stress: Not on file  Social  Connections: Not on file    Code Status: Full code  Review of Systems: A 12 point ROS discussed and pertinent positives are indicated in the HPI above.  All other systems are negative.  Review of Systems  Constitutional:  Negative for chills and fever.  Respiratory:  Negative for chest tightness and shortness of breath.   Cardiovascular:  Negative for chest pain and leg swelling.  Gastrointestinal:  Positive for constipation. Negative for abdominal pain, diarrhea, nausea and vomiting.  Musculoskeletal:  Positive for back pain.  Neurological:  Negative for dizziness and headaches.  Psychiatric/Behavioral:  Negative for confusion.     Vital Signs: BP 134/89   Pulse 88   Temp 97.6 F (36.4 C) (Oral)   Resp 20   Wt 175 lb (79.4 kg)   SpO2 97%   BMI 854.43 kg/m    Physical Exam Vitals reviewed.  Constitutional:      General: He is not in acute distress.    Appearance: He is not ill-appearing.  HENT:     Mouth/Throat:     Mouth: Mucous membranes are moist.  Eyes:     Pupils: Pupils are equal, round, and reactive to light.  Cardiovascular:     Rate and Rhythm: Normal rate and regular rhythm.     Pulses: Normal pulses.     Heart sounds: Normal heart sounds.  Pulmonary:     Effort: Pulmonary effort is normal.     Breath sounds: Normal breath sounds.  Abdominal:     Palpations: Abdomen is soft.     Tenderness: There is no abdominal tenderness.  Musculoskeletal:     Right lower leg: No edema.     Left lower leg: No edema.  Skin:    General: Skin is warm and dry.  Neurological:     Mental Status: He is alert and oriented to person, place, and time.  Psychiatric:        Mood and Affect: Mood normal.        Behavior: Behavior normal.        Thought Content: Thought content normal.        Judgment: Judgment normal.     Imaging: IR Radiologist Eval & Mgmt  Result Date: 10/04/2022 EXAM: NEW PATIENT OFFICE VISIT CHIEF COMPLAINT: Refer to EMR HISTORY OF PRESENT  ILLNESS: The patient a history of previously treated right lung adenocarcinoma, and  surveillance imaging in March of 2023 demonstrated new osseous metastatic disease to the left humerus and right iliac wing and sacrum. Patient then underwent external beam radiation. Patient reports that there was no meaningful pain relief following external radiation. Most recent follow-up PET-CT in September 22, 2022 demonstrated no residual metabolic activity aforementioned bony lesions. There was some residual uptake in a T11 vertebral body lesion. Today, the patient presents with main complaint of right lower back pain. He states the pain can be severe 10/10 at times, particularly when getting out of bed or bending over. He is currently taking tramadol as needed for pain, with somewhat mild relief to 7-9/10. He is unable to take narcotic pain medication due to constipation. He reports significant disability on the [employer] disability questionnaire with 19/24 positive. Additionally, the patient has been previously evaluated by orthopedic surgery, and has undergone steroid injections without any significant relief. REVIEW OF SYSTEMS: Refer to EMR PHYSICAL EXAMINATION: Refer to EMR ASSESSMENT AND PLAN: Refer to EMR Electronically Signed   By: Albin Felling M.D.   On: 10/04/2022 17:11   CT Chest W Contrast  Result Date: 09/22/2022 CLINICAL DATA:  Non-small cell right lung cancer with bone metastases. Interval observation. Restaging. * Tracking Code: BO * EXAM: CT CHEST WITH CONTRAST TECHNIQUE: Multidetector CT imaging of the chest was performed during intravenous contrast administration. RADIATION DOSE REDUCTION: This exam was performed according to the departmental dose-optimization program which includes automated exposure control, adjustment of the mA and/or kV according to patient size and/or use of iterative reconstruction technique. CONTRAST:  31m OMNIPAQUE IOHEXOL 300 MG/ML  SOLN COMPARISON:  06/22/2022 CT chest,  abdomen and pelvis. 03/20/2022 PET-CT. FINDINGS: Cardiovascular: Normal heart size. No significant pericardial effusion/thickening. Mitral valve prosthesis in place. Three-vessel coronary atherosclerosis. Atherosclerotic thoracic aorta with dilated 4.1 cm ascending thoracic aorta. Normal caliber pulmonary arteries. No central pulmonary emboli. Mediastinum/Nodes: No significant thyroid nodules. Unremarkable esophagus. No pathologically enlarged axillary, mediastinal or hilar lymph nodes. Lungs/Pleura: No pneumothorax. No pleural effusion. Sharply marginated right perihilar lung consolidation with associated bronchiectasis, volume loss and distortion, unchanged, compatible with radiation fibrosis. No acute consolidative airspace disease or new significant pulmonary nodules. Upper abdomen: Subcentimeter hypodense lateral segment left liver lesion is too small to characterize and unchanged. Subcentimeter hyperdense posterior right liver lesion is too small to characterize and unchanged. Musculoskeletal: Sclerotic posterior right T11 vertebral 2.0 cm lesion (series 7/image 149), increased from 1.4 cm on 06/22/2022 chest CT. Limited visualization of sclerotic proximal left humerus metadiaphysis lesion, incompletely evaluated. No new focal osseous lesions in the chest. IMPRESSION: 1. Sclerotic posterior right T11 vertebral lesion is increased in size since 06/22/2022 chest CT, indeterminate for delayed treatment response versus progressive metastasis. Continued PET-CT follow-up suggested. 2. Limited visualization of known sclerotic proximal left humerus metadiaphysis lesion, incompletely evaluated. 3. 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 4. No new sites of metastatic disease in the chest. 5. Three-vessel coronary atherosclerosis. 6. Dilated 4.1 cm ascending thoracic aorta. Recommend annual imaging followup by CTA or MRA. This recommendation follows 2010  ACCF/AHA/AATS/ACR/ASA/SCA/SCAI/SIR/STS/SVM Guidelines for the Diagnosis and Management of Patients with Thoracic Aortic Disease. Circulation. 2010; [id number] Aortic aneurysm NOS (ICD10-I71.9). 7.  Aortic Atherosclerosis (ICD10-I70.0). Electronically Signed   By: JIlona SorrelM.D.   On: 09/22/2022 15:43   NM PET Image Restag (PS) Skull Base To Thigh  Result Date: 09/22/2022 CLINICAL DATA:  Subsequent treatment strategy for stage IV non-small cell right lung cancer  with history of radiation therapy to the right ilium, left humerus and right lung. EXAM: NUCLEAR MEDICINE PET SKULL BASE TO THIGH TECHNIQUE: 9.0 mCi F-18 FDG was injected intravenously. Full-ring PET imaging was performed from the skull base to thigh after the radiotracer. CT data was obtained and used for attenuation correction and anatomic localization. Fasting blood glucose: 101 mg/dl COMPARISON:  06/22/2022 CT chest, abdomen and pelvis. 03/20/2022 PET-CT. FINDINGS: Mediastinal blood pool activity: SUV max 2.7 Liver activity: SUV max NA NECK: No hypermetabolic lymph nodes in the neck. Incidental CT findings: None. CHEST: No enlarged or hypermetabolic axillary, mediastinal or hilar lymph nodes. No hypermetabolic pulmonary findings. Stable sharply marginated right perihilar lung consolidation without associated focal hypermetabolism. Incidental CT findings: Three-vessel coronary atherosclerosis. Atherosclerotic thoracic aorta with dilated 4.2 cm ascending thoracic aorta. ABDOMEN/PELVIS: No abnormal hypermetabolic activity within the liver, pancreas, adrenal glands, or spleen. No hypermetabolic lymph nodes in the abdomen or pelvis. Incidental CT findings: Atherosclerotic nonaneurysmal abdominal aorta. SKELETON: No new focal hypermetabolic skeletal lesions. Sclerotic right T11 vertebral lesion demonstrates max SUV 2.9, previous max SUV 2.3, not substantially changed in metabolism, mildly increased in size from 1.4 cm to 2.0 cm. Sclerotic proximal  left humerus metadiaphyseal lesion is non hypermetabolic with max SUV 1.6, previous max SUV 5.4, decreased. Sclerotic medial right iliac bone lesion is non hypermetabolic with max SUV 1.4, previous max SUV 3.2, decreased. Incidental CT findings: None. IMPRESSION: 1. Mildly hypermetabolic sclerotic right [id number] vertebral lesion is mildly increased in size and not substantially changed in metabolism, warranting continued PET-CT follow-up. 2. Sclerotic lesions in the proximal left humerus and right pelvis are now non-hypermetabolic, compatible with positive response to therapy. 3. No new sites of hypermetabolic metastatic disease. 4. Three-vessel coronary atherosclerosis. 5. Dilated 4.2 cm ascending thoracic aorta. Recommend annual imaging followup by CTA or MRA. This recommendation follows 2010 ACCF/AHA/AATS/ACR/ASA/SCA/SCAI/SIR/STS/SVM Guidelines for the Diagnosis and Management of Patients with Thoracic Aortic Disease. Circulation. 2010; [id number]. Aortic aneurysm NOS (ICD10-I71.9). 6.  Aortic Atherosclerosis (ICD10-I70.0). Electronically Signed   By: Ilona Sorrel M.D.   On: 09/22/2022 15:28    Labs:  CBC: Recent Labs    11/28/21 1005 02/21/22 0957 06/22/22 1431 09/22/22 0756  WBC 4.7 4.4 5.2 4.7  HGB 14.3 14.3 13.5 15.0  HCT 41.6 41.3 39.4 44.9  PLT 183 153 166 178    COAGS: Recent Labs    11/28/21 1005  INR 1.0    BMP: Recent Labs    11/28/21 1005 02/21/22 0957 06/22/22 1431 09/22/22 0756  NA 141 142 142 142  K 3.9 4.1 4.0 4.1  CL 109 108 107 107  CO2 '24 30 28 29  '$ GLUCOSE 104* 100* 91 95  BUN '14 18 16 11  '$ CALCIUM 9.4 9.7 9.5 10.0  CREATININE 0.64 0.78 0.79 0.73  GFRNONAA >60 >60 >60 >60    LIVER FUNCTION TESTS: Recent Labs    02/21/22 0957 06/22/22 1431 09/22/22 0756  BILITOT 1.2 0.8 1.1  AST '22 27 21  '$ ALT '25 25 20  '$ ALKPHOS 66 76 76  PROT 6.8 6.6 7.1  ALBUMIN 4.4 4.3 4.4    TUMOR MARKERS: No results for input(s): "AFPTM", "CEA", "CA199", "CHROMGRNA" in the  last 8760 hours.  Assessment and Plan:  Ulises Bells is a 67 yo male being seen today in relation to an osseous metastatic lesion to his right posterior iliac bone. The patient is followed by Dr Julien Nordmann from Oncology, who referred the patient to IR. Due to severe pain from  the patient's pain level, an image-guided radiofrequency ablation with Osteocool of right iliac bone lesion is planned for today. The case has been reviewed and approved by Dr Denna Haggard.  Risks and benefits of image-guided right iliac bone lesion ablation with Osteocool were discussed with the patient including, but not limited to education regarding the natural healing process of compression fractures without intervention, bleeding, infection, cement migration which may cause spinal cord damage, paralysis, pulmonary embolism or even death.  This interventional procedure involves the use of X-rays and because of the nature of the planned procedure, it is possible that we will have prolonged use of X-ray fluoroscopy.  Potential radiation risks to you include (but are not limited to) the following: - A slightly elevated risk for cancer  several years later in life. This risk is typically less than 0.5% percent. This risk is low in comparison to the normal incidence of human cancer, which is 33% for women and 50% for men according to the [place]. - Radiation induced injury can include skin redness, resembling a rash, tissue breakdown / ulcers and hair loss (which can be temporary or permanent).   The likelihood of either of these occurring depends on the difficulty of the procedure and whether you are sensitive to radiation due to previous procedures, disease, or genetic conditions.   IF your procedure requires a prolonged use of radiation, you will be notified and given written instructions for further action.  It is your responsibility to monitor the irradiated area for the 2 weeks following the procedure and to  notify your physician if you are concerned that you have suffered a radiation induced injury.    All of the patient's questions were answered, patient is agreeable to proceed.  Consent signed and in chart.    Thank you for this interesting consult.  I greatly enjoyed meeting ADAM RENTSCHLER and look forward to participating in their care.  A copy of this report was sent to the requesting provider on this date.  Electronically Signed: Lura Em, PA 10/16/2022, 9:44 AM   I spent a total of  25 Minutes in face to face in clinical consultation, greater than 50% of which was counseling/coordinating care for right iliac bone lesion.

## 2022-10-16 NOTE — Progress Notes (Signed)
Patient clinically stable post Bone CT tumor ablation x3 per Dr Denna Haggard, tolerated well. Vitals stable pre and post procedure. Denies complaints post procedure. Received Versed 4 mg along with Fentanyl 200 mcg for entire procedure. Report given to Genelle Bal RN post procedure/331.

## 2022-10-16 NOTE — Discharge Instructions (Signed)
Kyphoplasty, Care After This sheet gives you information about how to care for yourself after your procedure. Your health care provider may also give you more specific instructions. If you have problems or questions, contact your health care provider. What can I expect after the procedure? After the procedure, it is common to have back pain. Follow these instructions at home: Medicines and Diet Take over-the-counter and prescription medicines only as told by your health care provider. Take over-the-counter or prescription medicines that you normally take.  However, if you are taking Aspirin or an anticoagulant/blood thinner you will be told when you can resume taking these by the healthcare provider.  You may resume a regular diet Eat foods that are high in fiber, such as beans, whole grains, and fresh fruits and vegetables. Limit foods that are high in fat and processed sugars, such as fried or sweet foods. Puncture site care   You may remove bandaid tomorrow after taking a shower.  Replace daily with a clean bandaid until healed. Check your puncture site every day for signs of infection. Check for: Redness, swelling, or pain. Fluid or blood. Warmth. Pus or a bad smell.   Managing pain, stiffness, and swelling If directed, put ice on the painful area. To do this: You may use an ice pack as needed to the injection sites on your back.  Ice to the site 20 minutes on and 20 minutes off, as needed. Place a towel between your skin and the bag.   Activity No driving or operating machinery for 24 hours after your procedure. Do not lift anything that is heavier than a milk jug for 1 to 2 weeks or determined by your physician..  Follow up with your physician in 2 weeks.  Contact a health care provider if: You have a fever greater than 100 degrees You have redness, swelling, or pain at the site of your puncture. You have fluid, blood, or pus coming from the puncture site. You have pain that  gets worse or does not get better with medicine. You develop numbness or weakness in any part of your body. Get help right away if: You have chest pain. You have difficulty breathing. You have weakness, numbness, or tingling in your legs. You cannot control your bladder or bowel movements (incontinence). You suddenly become weak or numb on one side of your body. You become very confused. You have trouble speaking or understanding, or both. These symptoms may represent a serious problem that is an emergency. Do not wait to see if the symptoms will go away. Get medical help right away. Call your local emergency services (911 in the [place] Do not drive yourself to the hospital. Summary Follow instructions from your health care provider about how to take care of your puncture site. Take over-the-counter and prescription medicines only as told by your health care provider. Rest your back and avoid intense physical activity for as long as told by your health care provider. Contact a health care provider if you have pain that gets worse or does not get better with medicine. Keep all follow-up visits. This is important. This information is not intended to replace advice given to you by your health care provider. Make sure you discuss any questions you have with your health care provider. Document Revised: 11/19/2019 Document Reviewed: 11/19/2019 Elsevier Patient Education  2022 [employer].       

## 2022-10-31 ENCOUNTER — Other Ambulatory Visit: Payer: Self-pay | Admitting: Interventional Radiology

## 2022-10-31 DIAGNOSIS — C3490 Malignant neoplasm of unspecified part of unspecified bronchus or lung: Secondary | ICD-10-CM

## 2022-11-01 ENCOUNTER — Ambulatory Visit
Admission: RE | Admit: 2022-11-01 | Discharge: 2022-11-01 | Disposition: A | Discharge status: Home / Self Care | Payer: Medicare Other | Source: Ambulatory Visit | Attending: Interventional Radiology | Admitting: Interventional Radiology

## 2022-11-01 DIAGNOSIS — C3490 Malignant neoplasm of unspecified part of unspecified bronchus or lung: Secondary | ICD-10-CM

## 2022-11-01 HISTORY — PX: IR RADIOLOGIST EVAL & MGMT: IMG5224

## 2022-11-01 NOTE — Progress Notes (Signed)
Interventional Radiology - Clinic Visit, Follow-up Note    History of Present Illness  Alan [name] is a 67 y.o. male with a relevant past medical history of right lung adenocarcinoma seen today in Interventional Radiology clinic for follow up painful osseous metastatic disease, now s/p right iliac bone RFA (OsteoCool) on 10/16/2022.   The patient is now approximately 2 weeks status post radiofrequency ablation of his right iliac bone sclerotic lesion.  I had treated the central and largest portion of the sclerotic lesion over 3 separate ablation sites during a single session.  Today, he reports approximately 20% improvement in his pain, and rates his pain has decreased from 9/10 to 7/10.  He states he predominantly feels the improvement when he is able to sit up and walk around with no significant pain.  For example, he states he was able to walk around his yard over the weekend without any significant discomfort, where before he was not able to do this.  He still reports some pain/pinching sensation when sitting down and bending over.    He maintains an alternating pain regimen of acetaminophen/ibuprofen/tramadol as needed.  He does have trouble with tramadol due to constipation.    Past medical and surgical history reviewed. No interval changes. No interval hospitalizations.   Medications  I have reviewed the current medication list. Refer to chart for details. Current Outpatient Medications  Medication Instructions   atorvastatin (LIPITOR) 10 mg, Oral, Daily   Co Q 10 100 mg, Daily   cyanocobalamin (VITAMIN B12) 1,000 mcg, Oral, Daily   Hypromellose (HYDROXYPROPYL METHYLCELLULOSE) POWD No dose, route, or frequency recorded.   magnesium 30 MG tablet 1 tablet, Oral, Daily at bedtime   Melatonin 10 mg, Oral, Daily at bedtime   naltrexone (DEPADE) 50 MG tablet Oral, Daily, Taking 1 mg daily   Specialty Vitamins Products (PROSTATE PO) 1 capsule, Oral, Daily, PROSTATE FORMULA    traMADol  (ULTRAM) 50 mg, Oral, 3 times daily PRN   vitamin C 2,000 mg, Oral, Daily   Vitamin D-Vitamin K (D3 + K2 DOTS PO) 1 tablet, Oral, Daily      Physical Exam Current Vitals Temp: 97.9 F (36.6 C) (Temp Source: Oral)  Pulse Rate: 92  Resp: 14  BP: 133/86  SpO2: 97 %  Height: 6\' 4"  (193 cm)  Weight: 81.6 kg  Body mass index is 21.91 kg/m.  General: Alert and answers questions appropriately. No apparent distress. HEENT: Normocephalic, atraumatic.  Cardiac: Regular rate and rhythm. No dependent edema. Pulmonary: Normal work of breathing. On room air. Abdominal: Without distension. Extremities: Normally-formed, well perfused.     Relevant and/or Recent Imaging: No new imaging.    Assessment & Plan Alan [name] is a 67 y.o. male with a history of lung adenocarcinoma s/p CT guided RFA of his painful right iliac sclerotic metastasis on 11/15/2022.  He reports a 20% improvement in his pain following the procedure, and is happy with the result at this point, as 'taking the edge away' has given him the ability to be more mobile without being as limited by his pain. He is interested in trying to get another 10-20% improvement, which I think may be doable with additional RFA treatment sessions given the size of the lesion and the fact I only treated the central portion initially. We will wait 4-6 weeks to allow full healing from the initial treatment, and reassess his symptoms at that time.    Plan:  1. Follow up in 4-6 weeks to  reassess symptoms after initial treatment session. If pain persists, may consider additional RFA treatment of the iliac lesion.      Total time spent on today's visit was over  25 Minutes, including both face-to-face time and non face-to-face time, personally spent on review of chart (including labs and relevant imaging), discussing further workup and treatment options, referral to specialist if needed, reviewing outside records if pertinent, answering patient  questions, and coordinating care regarding right iliac bone metastatic disease as well as management strategy.      Albin Felling, MD  Vascular and Interventional Radiology 11/01/2022 11:18 AM

## 2022-11-02 ENCOUNTER — Other Ambulatory Visit: Payer: Self-pay | Admitting: Interventional Radiology

## 2022-11-02 DIAGNOSIS — C3490 Malignant neoplasm of unspecified part of unspecified bronchus or lung: Secondary | ICD-10-CM

## 2022-12-06 ENCOUNTER — Other Ambulatory Visit: Payer: Self-pay | Admitting: Interventional Radiology

## 2022-12-06 ENCOUNTER — Ambulatory Visit
Admission: RE | Admit: 2022-12-06 | Discharge: 2022-12-06 | Disposition: A | Discharge status: Home / Self Care | Payer: Medicare Other | Source: Ambulatory Visit | Attending: Interventional Radiology | Admitting: Interventional Radiology

## 2022-12-06 DIAGNOSIS — C3490 Malignant neoplasm of unspecified part of unspecified bronchus or lung: Secondary | ICD-10-CM

## 2022-12-06 DIAGNOSIS — M899 Disorder of bone, unspecified: Secondary | ICD-10-CM

## 2022-12-06 HISTORY — PX: IR RADIOLOGIST EVAL & MGMT: IMG5224

## 2022-12-06 NOTE — Progress Notes (Signed)
Interventional Radiology - Clinic Visit, Follow-up Note    History of Present Illness  Alan [name] is a 67 y.o. male with a relevant past medical history of right lung adenocarcinoma seen today in Interventional Radiology clinic for follow up painful osseous metastatic disease, now s/p right iliac bone RFA (OsteoCool) on 10/16/2022.    The patient is now approximately 8 weeks status post radiofrequency ablation of his right iliac bone sclerotic lesion.  I had treated the central and largest portion of the sclerotic lesion over 3 separate ablation sites during a single session.    Although the patient reported approximately 20% improvement in his pain at the time of his previous visit stating that his pain had improved from 9/10 to 7/10, the patient now reports that his pain is no different to slightly worse, with his pain averaging about 8/10.  He reports that the pain is generally fairly constant, and worse with activity.    Past medical and surgical history reviewed. No interval changes. No interval hospitalizations.   Medications  I have reviewed the current medication list. Refer to chart for details. Current Outpatient Medications  Medication Instructions   atorvastatin (LIPITOR) 10 mg, Oral, Daily   Co Q 10 100 mg, Daily   cyanocobalamin (VITAMIN B12) 1,000 mcg, Oral, Daily   Hypromellose (HYDROXYPROPYL METHYLCELLULOSE) POWD No dose, route, or frequency recorded.   magnesium 30 MG tablet 1 tablet, Oral, Daily at bedtime   Melatonin 10 mg, Oral, Daily at bedtime   naltrexone (DEPADE) 50 MG tablet Oral, Daily, Taking 1 mg daily   Specialty Vitamins Products (PROSTATE PO) 1 capsule, Oral, Daily, PROSTATE FORMULA    traMADol (ULTRAM) 50 mg, Oral, 3 times daily PRN   vitamin C 2,000 mg, Oral, Daily   Vitamin D-Vitamin K (D3 + K2 DOTS PO) 1 tablet, Oral, Daily       Physical exam deferred due to telephone visit.      Pertinent Lab Results    Latest Ref Rng & Units  10/16/2022    9:38 AM 09/22/2022    7:56 AM 06/22/2022    2:31 PM  CBC  WBC 4.0 - 10.5 K/uL 3.4  4.7  5.2   Hemoglobin 13.0 - 17.0 g/dL 40.9  81.1  91.4   Hematocrit 39.0 - 52.0 % 42.5  44.9  39.4   Platelets 150 - 400 K/uL 177  178  166       Latest Ref Rng & Units 10/16/2022    9:38 AM 09/22/2022    7:56 AM 06/22/2022    2:31 PM  CMP  Glucose 70 - 99 mg/dL 99  95  91   BUN 8 - 23 mg/dL Creatinine 0.61 - 1.24 mg/dL 7.82  9.56  2.13   Sodium 135 - 145 mmol/L 141  142  142   Potassium 3.5 - 5.1 mmol/L 3.9  4.1  4.0   Chloride 98 - 111 mmol/L 107  107  107   CO2 22 - 32 mmol/L Calcium 8.9 - 10.3 mg/dL 9.6  08.6  9.5   Total Protein 6.5 - 8.1 g/dL  7.1  6.6   Total Bilirubin 0.3 - 1.2 mg/dL  1.1  0.8   Alkaline Phos 38 - 126 U/L  76  76   AST 15 - 41 U/L  21  27   ALT 0 - 44 U/L  20  25  Relevant and/or Recent Imaging: No new imaging    Assessment & Plan Alan [name] is a 67 y.o. male with a history of lung adenocarcinoma s/p CT guided RFA of his painful right iliac sclerotic metastasis on 11/15/2022.   On today's visit, he reports similar to worsening pain despite initial improvement.  Given that the lesion has not been completely treated, I think it's reasonable to schedule additional treatment session with 1 or 2 additional ablation sites planned surrounding the original ablation.  The patient was in agreement, and will be scheduled accordingly.     Plan:  1. CT guided RFA (Osteocool) of right iliac sclerotic bone lesion   - [facility] with Dr. Juliette Alcide  - Moderate sedation  - Outpatient with same day discharge     Total time spent on today's visit was over 15 Minutes, including both face-to-face time and non face-to-face time, personally spent on review of chart (including labs and relevant imaging), discussing further workup and treatment options, referral to specialist if needed, reviewing outside records if pertinent, answering patient questions,  and coordinating care regarding right iliac bone metastatic disease as well as management strategy.     Olive Bass, MD  Vascular and Interventional Radiology 12/06/2022 11:49 AM

## 2022-12-13 NOTE — Progress Notes (Signed)
Zollie Scale sent to Rema Jasmine, Trixie Deis, RT; Meta Hatchet, RT Marylu Lund, this is the one Dr Juliette Alcide sent in chat today  Name: Alan [name] 02/16/1956  Procedure:CT guided iliac bone lesion ablation (RFA, using the OsteoCool device).  Reason for procedure: Iliac bone lesion  Relevant History: In EPIC  Order Physician: Juliette Alcide  Office Contact: Victorino Dike  Ins: [facility]/[employer]-no auth req'd for either insurance//js

## 2022-12-15 ENCOUNTER — Other Ambulatory Visit: Payer: Self-pay | Admitting: Cardiology

## 2022-12-16 NOTE — H&P (Signed)
Chief Complaint: Adenocarcinoma of the right lung with painful metastasis to the bone. Patient presents for right iliac bone lesion RFA ablation with OsteoCool.  Referring Physician(s): Pernell Dupre  Supervising Physician: {Supervising Physician:[id number]}  Patient Status: {IR Consult Patient Status:21879}  History of Present Illness: Alan [name] is a 67 y.o. male outpatient. 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 Patient endorses right lower back pain that is unresolved by pain medication. S/p radiofrequency ablation of the sclerotic right iliac bone lesion with cement augmentation on 3.4.24. Patient was seen in the Interventional Radiology clinic on 4.24.24 for 8 week  post op evaluation with IR Attending Dr. Elijio Miles El-Abd. At that time the Patient reported a decrease from a 10/10 to 8/10. As the lesion was not completed treated that decision was made to scheduled an additional ablation treatment. Patient presents for CT guided iliac bone lesion RFA OsteoCool ablation.    Past Medical History:  Diagnosis Date   Adenocarcinoma of right lung, stage 3 (HCC) dx'd 03/2019   CAD S/P percutaneous coronary angioplasty 2004   (Ant STEMI) - Prox LAD 100% => 2 overlapping 3.5 x 1.8 mm Cypher DES stents.;  Patent as of August 2015   Dyslipidemia, goal LDL below 70    Essential hypertension    GERD (gastroesophageal reflux disease)    History of Mitral valve prolapse    Moderate - with moderate MR, noted February t 2013   History of radiation therapy    Right Pelvis & left humerus- 12/05/21-12/16/21- Dr. Antony Blackbird   History of Severe mitral regurgitation by prior echocardiogram 02/06/2014   TEE: Severe mitral regurgitation with a flail P2 segment and ruptured; Normal LV size & function, dilated LA.   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    Past Surgical History:  Procedure Laterality Date   ANTERIOR CRUCIATE LIGAMENT REPAIR Left 1993   COLONOSCOPY     INTRAOPERATIVE TRANSESOPHAGEAL ECHOCARDIOGRAM N/A 04/08/2014   Procedure: INTRAOPERATIVE TRANSESOPHAGEAL ECHOCARDIOGRAM;  Surgeon: Purcell Nails, MD;  [facility] OR;  Service: Open Heart Surgery;  Laterality: N/A;   IR RADIOLOGIST EVAL & MGMT  10/04/2022   IR RADIOLOGIST EVAL & MGMT  11/01/2022   IR RADIOLOGIST EVAL & MGMT  12/06/2022   LEFT AND RIGHT HEART CATHETERIZATION WITH CORONARY ANGIOGRAM N/A 03/05/2014   Procedure: LEFT AND RIGHT HEART CATHETERIZATION WITH CORONARY ANGIOGRAM;  Surgeon: Marykay Lex, MD;  Location: [facility] CATH LAB;  Service: Cardiovascular; (pre-op) Widely patent LAD stents, 40% distal LAD, 30% Cx, ~50% PL-PDA bifurcation lesion    LEFT HEART CATH AND CORONARY ANGIOGRAPHY  2004   In setting of anterior STEMI, found to have 100% % proximal LAD (2 DES Cypher stents)   LEFT HEART CATH AND CORONARY ANGIOGRAPHY  2007   Widely patent LAD stents, 40% distal LAD, 30% Cx, ~50% PL-PDA  bifurcation lesion    LEFT HEART CATHETERIZATION WITH CORONARY ANGIOGRAM N/A 09/15/2011   Procedure: LEFT HEART CATHETERIZATION WITH CORONARY ANGIOGRAM;  Surgeon: Runell Gess, MD;  Location: [facility] CATH LAB;  Service: Cardiovascular;  Laterality: N/A;   MITRAL VALVE REPAIR Right 04/08/2014   Procedure: MINIMALLY INVASIVE MITRAL VALVE REPAIR (MVR);   Surgeon: Purcell Nails, MD;  Location: [facility] OR;  Service: Open Heart Surgery;  Laterality: Right;   NM MYOVIEW LTD  12/2009   Walk 9 minutes, and 10 METs, diaphragmatic attenuation but no ischemia or infarction.   SVT ABLATION N/A 10/17/2019   Procedure: SVT ABLATION;  Surgeon: Regan Lemming, MD;  Location: [facility] INVASIVE CV LAB;  Service: Cardiovascular;  Laterality: N/A;   TEE WITHOUT CARDIOVERSION N/A 02/06/2014   Procedure: TRANSESOPHAGEAL ECHOCARDIOGRAM (TEE);  Surgeon: Chrystie Nose, MD;  Normal LV Size U& function - EF 55-60%, no regional WMA.  MV P2 Leaflet is flail with ruptured chord with severe prolapse, anterior leaflet intact.  Severe, eccentric anterior directed MR with dilated LA.   TRANSTHORACIC ECHOCARDIOGRAM  07/18/2018   Normal LV size and function.  EF 50-60 %.  Unable to assess diastolic function.  Mitral valve sewing ring in place.  Mild stenosis with a gradient of 6 mmHg noted. -   TRANSTHORACIC ECHOCARDIOGRAM  09/30/2021   Stable findings.  EF 50 to 55%.  No RWMA.  Mitral valve stable.  No significant MR.  Normal aortic valve.  Mild aortic dilation, but probably normal for age.   VIDEO BRONCHOSCOPY WITH ENDOBRONCHIAL ULTRASOUND N/A 05/16/2019   Procedure: VIDEO BRONCHOSCOPY WITH ENDOBRONCHIAL ULTRASOUND;  Surgeon: Chilton Greathouse, MD;  Location: [facility] OR;  Service: Pulmonary;  Laterality: N/A;    Allergies: A-cillin [ampicillin], Amoxicillin, Other, Penicillins, Sulfa antibiotics, and Crestor [rosuvastatin]  Medications: Prior to Admission medications   Medication Sig Start Date End Date Taking? Authorizing Provider  Ascorbic Acid (VITAMIN C) 1000 MG tablet Take 2,000 mg by mouth daily.     [provider]  atorvastatin (LIPITOR) 10 MG tablet TAKE 1 TABLET(10 MG) BY MOUTH DAILY 12/15/22   Marykay Lex, MD  Coenzyme Q10 (CO Q 10) 100 MG CAPS Take 100 mg by mouth daily.     [provider]  Hypromellose (HYDROXYPROPYL METHYLCELLULOSE) POWD   07/02/20   [provider]  magnesium 30 MG tablet Take 1 tablet by mouth at bedtime.    [provider]  Melatonin 10 MG TABS Take 10 mg by mouth at bedtime.    [provider]  naltrexone (DEPADE) 50 MG tablet Take by mouth daily. Taking 1 mg daily    [provider]  Specialty Vitamins Products (PROSTATE PO) Take 1 capsule by mouth daily. PROSTATE FORMULA    [provider]  traMADol (ULTRAM) 50 MG tablet Take 50 mg by mouth 3 (three) times daily as needed. 10/03/22   [provider]  vitamin B-12 (CYANOCOBALAMIN) 1000 MCG tablet Take 1,000 mcg by mouth daily.    [provider]  Vitamin D-Vitamin K (D3 + K2 DOTS PO) Take 1 tablet by mouth daily.    [provider]     Family History  Problem Relation Age of Onset   Hypertension Mother    Heart Problems Father        triple bypass 1989   Cancer Paternal Grandmother        Pancreatic    Social History   Socioeconomic History   Marital status: Married  Spouse name: Not on file   Number of children: Not on file   Years of education: Not on file   Highest education level: Not on file  Occupational History   Not on file  Tobacco Use   Smoking status: Never   Smokeless tobacco: Never  Vaping Use   Vaping Use: Never used  Substance and Sexual Activity   Alcohol use: Yes    Alcohol/week: 10.0 standard drinks of alcohol    Types: 5 Cans of beer, 5 Shots of liquor per week    Comment: social   Drug use: No   Sexual activity: Not on file  Other Topics Concern   Not on file  Social History Narrative   He is a married father of one. Exercises avidly as noted above - runs routinely at least 3 miles 3-4 times a day.  He drinks his Xango fruit juce - 32 Oz. Daily.     Never smoked and only takes occasional alcohol   Social Determinants of Health   Financial Resource Strain: Not on file  Food Insecurity: Not on file  Transportation Needs: Not on file   Physical Activity: Not on file  Stress: Not on file  Social Connections: Not on file    ECOG Status: {CHL ONC ECOG UE:[id number]}  Review of Systems: A 12 point ROS discussed and pertinent positives are indicated in the HPI above.  All other systems are negative.  Review of Systems  Vital Signs: There were no vitals taken for this visit.  Advance Care Plan: {Advance Care YNWG:95621}    Physical Exam  Imaging: IR Radiologist Eval & Mgmt  Result Date: 12/06/2022 EXAM: ESTABLISHED PATIENT OFFICE VISIT CHIEF COMPLAINT: Refer to EMR HISTORY OF PRESENT ILLNESS: Although the patient reported approximately 20% improvement in his pain at the time of his previous visit stating that his pain had improved from 9/10 to 7/10, the patient reports that his pain is no different to slightly worse, with his pain averaging about 8/10. He reports that the pain is generally fairly constant, and worse with activity. On today's visit, he reports similar to worsening pain despite initial improvement. Given that the lesion has not been completely treated, I think it is reasonable to schedule additional treatment session with 1 or 2 additional ablation sites planned surrounding the original ablation. The patient was in agreement, and will be scheduled accordingly. REVIEW OF SYSTEMS: Refer to EMR PHYSICAL EXAMINATION: Refer to EMR ASSESSMENT AND PLAN: Refer to EMR Electronically Signed   By: Olive Bass M.D.   On: 12/06/2022 12:01    Labs:  CBC: Recent Labs    02/21/22 0957 06/22/22 1431 09/22/22 0756 10/16/22 0938  WBC 4.4 5.2 4.7 3.4*  HGB 14.3 13.5 15.0 14.0  HCT 41.3 39.4 44.9 42.5  PLT 153 166 178 177    COAGS: Recent Labs    10/16/22 0938  INR 1.0    BMP: Recent Labs    02/21/22 0957 06/22/22 1431 09/22/22 0756 10/16/22 0938  NA 142 142 142 141  K 4.1 4.0 4.1 3.9  CL 108 107 107 107  CO2 30 28 29 27   GLUCOSE 100* 91 95 99  BUN 18 16 11 16   CALCIUM 9.7 9.5 10.0 9.6   CREATININE 0.78 0.79 0.73 0.65  GFRNONAA >60 >60 >60 >60    LIVER FUNCTION TESTS: Recent Labs    02/21/22 0957 06/22/22 1431 09/22/22 0756  BILITOT 1.2 0.8 1.1  AST 22 27 21   ALT 25 25  20  ALKPHOS 66 76 76  PROT 6.8 6.6 7.1  ALBUMIN 4.4 4.3 4.4      Assessment and Plan:  67 y.o. male outpatient. 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 Patient endorses right lower back pain that is unresolved by pain medication. S/p radiofrequency ablation of the sclerotic right iliac bone lesion with cement augmentation on 3.4.24. Patient was seen in the Interventional Radiology clinic on 4.24.24 for 8 week  post op evaluation with IR Attending Dr. Elijio Miles El-Abd. At that time the Patient reported a decrease from a 10/10 to 8/10. As the lesion was not completed treated that decision was made to scheduled an additional ablation treatment. Patient presents for CT guided iliac bone lesion RFA OsteoCool ablation.   ***All labs and medications are within acceptable parameters. Allergies include PCN and sulfa. Patient has been NPO since midnight.   Thank you for this interesting consult.  I greatly enjoyed meeting DEWELL CAGLE and look forward to participating in their care.  A copy of this report was sent to the requesting provider on this date.  Electronically Signed: Alene Mires, NP 12/16/2022, 5:38 PM   I spent a total of {New ZOXW:[id number]} {New Out-Pt:[id number]}  {Established Out-Pt:[id number]} in face to face in clinical consultation, greater than 50% of which was counseling/coordinating care for ***

## 2022-12-18 ENCOUNTER — Other Ambulatory Visit: Payer: Self-pay | Admitting: Internal Medicine

## 2022-12-18 DIAGNOSIS — C7951 Secondary malignant neoplasm of bone: Secondary | ICD-10-CM

## 2022-12-18 NOTE — Progress Notes (Signed)
Patient for CT Bone Lesion RF Ablation with Osteocool on Tues 12/19/2022, I called and spoke with the patient on the phone and gave pre-procedure instructions. Pt was made aware to be here at 7:30a, NPO after MN prior to procedure as well as driver post procedure/recovery/discharge. Pt stated understanding.  Called 12/11/2022 and 12/14/2022

## 2022-12-19 ENCOUNTER — Ambulatory Visit
Admission: RE | Admit: 2022-12-19 | Discharge: 2022-12-19 | Disposition: A | Discharge status: Home / Self Care | Payer: Medicare Other | Source: Ambulatory Visit | Attending: Interventional Radiology | Admitting: Interventional Radiology

## 2022-12-19 ENCOUNTER — Other Ambulatory Visit: Payer: Self-pay | Admitting: Radiology

## 2022-12-19 DIAGNOSIS — G893 Neoplasm related pain (acute) (chronic): Secondary | ICD-10-CM | POA: Diagnosis not present

## 2022-12-19 DIAGNOSIS — C7951 Secondary malignant neoplasm of bone: Secondary | ICD-10-CM | POA: Diagnosis not present

## 2022-12-19 DIAGNOSIS — M545 Low back pain, unspecified: Secondary | ICD-10-CM | POA: Insufficient documentation

## 2022-12-19 DIAGNOSIS — M899 Disorder of bone, unspecified: Secondary | ICD-10-CM

## 2022-12-19 DIAGNOSIS — C3491 Malignant neoplasm of unspecified part of right bronchus or lung: Secondary | ICD-10-CM | POA: Insufficient documentation

## 2022-12-19 DIAGNOSIS — I10 Essential (primary) hypertension: Secondary | ICD-10-CM | POA: Insufficient documentation

## 2022-12-19 DIAGNOSIS — I252 Old myocardial infarction: Secondary | ICD-10-CM | POA: Insufficient documentation

## 2022-12-19 LAB — CBC WITH DIFFERENTIAL/PLATELET
Abs Immature Granulocytes: 0.01 10*3/uL (ref 0.00–0.07)
Basophils Absolute: 0 10*3/uL (ref 0.0–0.1)
Basophils Relative: 1 %
Eosinophils Absolute: 0.2 10*3/uL (ref 0.0–0.5)
Eosinophils Relative: 4 %
HCT: 43.7 % (ref 39.0–52.0)
Hemoglobin: 14.5 g/dL (ref 13.0–17.0)
Immature Granulocytes: 0 %
Lymphocytes Relative: 18 %
Lymphs Abs: 0.7 10*3/uL (ref 0.7–4.0)
MCH: 31.9 pg (ref 26.0–34.0)
MCHC: 33.2 g/dL (ref 30.0–36.0)
MCV: 96 fL (ref 80.0–100.0)
Monocytes Absolute: 0.5 10*3/uL (ref 0.1–1.0)
Monocytes Relative: 12 %
Neutro Abs: 2.6 10*3/uL (ref 1.7–7.7)
Neutrophils Relative %: 65 %
Platelets: 178 10*3/uL (ref 150–400)
RBC: 4.55 MIL/uL (ref 4.22–5.81)
RDW: 12.2 % (ref 11.5–15.5)
WBC: 4 10*3/uL (ref 4.0–10.5)
nRBC: 0 % (ref 0.0–0.2)

## 2022-12-19 LAB — BASIC METABOLIC PANEL
Anion gap: 7 (ref 5–15)
BUN: 18 mg/dL (ref 8–23)
CO2: 27 mmol/L (ref 22–32)
Calcium: 9.5 mg/dL (ref 8.9–10.3)
Chloride: 106 mmol/L (ref 98–111)
Creatinine, Ser: 0.63 mg/dL (ref 0.61–1.24)
GFR, Estimated: >60 mL/min (ref >60)
Glucose, Bld: 94 mg/dL (ref 70–99)
Potassium: 3.8 mmol/L (ref 3.5–5.1)
Sodium: 140 mmol/L (ref 135–145)

## 2022-12-19 LAB — PROTIME-INR
INR: 1.1 (ref 0.8–1.2)
Prothrombin Time: 13.6 seconds (ref 11.4–15.2)

## 2022-12-19 MED ORDER — VANCOMYCIN HCL IN DEXTROSE 1-5 GM/200ML-% IV SOLN
INTRAVENOUS | Status: AC
  Filled 2022-12-19: qty 200

## 2022-12-19 MED ORDER — VANCOMYCIN HCL 500 MG/100ML IV SOLN
INTRAVENOUS | Status: AC | PRN
  Administered 2022-12-19: 1000 mg via INTRAVENOUS

## 2022-12-19 MED ORDER — LIDOCAINE HCL (PF) 1 % IJ SOLN
10.0000 mL | Freq: Once | INTRAMUSCULAR | Status: AC
  Administered 2022-12-19: 10 mL via INTRADERMAL
  Filled 2022-12-19: qty 10

## 2022-12-19 MED ORDER — MIDAZOLAM HCL 2 MG/2ML IJ SOLN
INTRAMUSCULAR | Status: AC
  Filled 2022-12-19: qty 4

## 2022-12-19 MED ORDER — FENTANYL CITRATE (PF) 100 MCG/2ML IJ SOLN
INTRAMUSCULAR | Status: AC | PRN
  Administered 2022-12-19: 25 ug via INTRAVENOUS

## 2022-12-19 MED ORDER — MIDAZOLAM HCL 2 MG/2ML IJ SOLN
INTRAMUSCULAR | Status: AC | PRN
  Administered 2022-12-19: .5 mg via INTRAVENOUS

## 2022-12-19 MED ORDER — FENTANYL CITRATE (PF) 100 MCG/2ML IJ SOLN
INTRAMUSCULAR | Status: AC | PRN
  Administered 2022-12-19: 25 ug via INTRAVENOUS
  Administered 2022-12-19: 50 ug via INTRAVENOUS
  Administered 2022-12-19 (×2): 25 ug via INTRAVENOUS
  Administered 2022-12-19: 50 ug via INTRAVENOUS

## 2022-12-19 MED ORDER — MIDAZOLAM HCL 2 MG/2ML IJ SOLN
INTRAMUSCULAR | Status: AC | PRN
  Administered 2022-12-19 (×3): .5 mg via INTRAVENOUS
  Administered 2022-12-19 (×2): 1 mg via INTRAVENOUS

## 2022-12-19 MED ORDER — SODIUM CHLORIDE 0.9 % IV SOLN: INTRAVENOUS | Status: DC

## 2022-12-19 MED ORDER — VANCOMYCIN HCL IN DEXTROSE 1-5 GM/200ML-% IV SOLN
1000.0000 mg | INTRAVENOUS | Status: DC
  Filled 2022-12-19: qty 200

## 2022-12-19 MED ORDER — FENTANYL CITRATE (PF) 100 MCG/2ML IJ SOLN
INTRAMUSCULAR | Status: AC
  Filled 2022-12-19: qty 4

## 2022-12-19 NOTE — Procedures (Signed)
Interventional Radiology Procedure Note  Date of Procedure: 12/19/2022  Procedure: Right iliac bone RFA/OsteoCool   Findings:  1. Right iliac bone RFA/OsteoCool    Complications: No immediate complications noted.   Estimated Blood Loss: minimal  Follow-up and Recommendations: 1. Bedrest 1 hour    Olive Bass, MD  Vascular & Interventional Radiology  12/19/2022 11:23 AM

## 2022-12-19 NOTE — Progress Notes (Signed)
Pt eating sandwich tray and drinking

## 2022-12-19 NOTE — Progress Notes (Signed)
Bleeding noted to lower back bandaid, provider team messaged

## 2022-12-19 NOTE — Progress Notes (Signed)
Pt's wife called and will come to bedside

## 2022-12-19 NOTE — Progress Notes (Signed)
Patient clinically stable post Bone Ablation per Dr Juliette Alcide, tolerated well. Vitals stable pre and post procedure. Awake/alert and oriented during and post procedure. Received Versed 4 mg along with Fentanyl  200 mcg IV for procedure. Report given to [facility] post procedure/19

## 2022-12-21 ENCOUNTER — Inpatient Hospital Stay: Payer: Medicare Other | Attending: Internal Medicine

## 2022-12-21 ENCOUNTER — Ambulatory Visit (HOSPITAL_COMMUNITY)
Admission: RE | Admit: 2022-12-21 | Discharge: 2022-12-21 | Disposition: A | Discharge status: Home / Self Care | Payer: Medicare Other | Source: Ambulatory Visit | Attending: Internal Medicine | Admitting: Internal Medicine

## 2022-12-21 DIAGNOSIS — C3490 Malignant neoplasm of unspecified part of unspecified bronchus or lung: Secondary | ICD-10-CM | POA: Insufficient documentation

## 2022-12-21 DIAGNOSIS — C7951 Secondary malignant neoplasm of bone: Secondary | ICD-10-CM | POA: Insufficient documentation

## 2022-12-21 DIAGNOSIS — C3431 Malignant neoplasm of lower lobe, right bronchus or lung: Secondary | ICD-10-CM | POA: Diagnosis present

## 2022-12-21 LAB — CMP (CANCER CENTER ONLY)
ALT: 20 U/L (ref 0–44)
AST: 23 U/L (ref 15–41)
Albumin: 4.5 g/dL (ref 3.5–5.0)
Alkaline Phosphatase: 70 U/L (ref 38–126)
Anion gap: 5 (ref 5–15)
BUN: 19 mg/dL (ref 8–23)
CO2: 31 mmol/L (ref 22–32)
Calcium: 9.7 mg/dL (ref 8.9–10.3)
Chloride: 106 mmol/L (ref 98–111)
Creatinine: 0.77 mg/dL (ref 0.61–1.24)
GFR, Estimated: >60 mL/min (ref >60)
Glucose, Bld: 99 mg/dL (ref 70–99)
Potassium: 4 mmol/L (ref 3.5–5.1)
Sodium: 142 mmol/L (ref 135–145)
Total Bilirubin: 1.4 mg/dL — ABNORMAL HIGH (ref 0.3–1.2)
Total Protein: 7 g/dL (ref 6.5–8.1)

## 2022-12-21 LAB — CBC WITH DIFFERENTIAL (CANCER CENTER ONLY)
Abs Immature Granulocytes: 0.01 10*3/uL (ref 0.00–0.07)
Basophils Absolute: 0 10*3/uL (ref 0.0–0.1)
Basophils Relative: 1 %
Eosinophils Absolute: 0.1 10*3/uL (ref 0.0–0.5)
Eosinophils Relative: 2 %
HCT: 42.7 % (ref 39.0–52.0)
Hemoglobin: 14.4 g/dL (ref 13.0–17.0)
Immature Granulocytes: 0 %
Lymphocytes Relative: 14 %
Lymphs Abs: 0.6 10*3/uL — ABNORMAL LOW (ref 0.7–4.0)
MCH: 32.4 pg (ref 26.0–34.0)
MCHC: 33.7 g/dL (ref 30.0–36.0)
MCV: 96.2 fL (ref 80.0–100.0)
Monocytes Absolute: 0.5 10*3/uL (ref 0.1–1.0)
Monocytes Relative: 12 %
Neutro Abs: 3.1 10*3/uL (ref 1.7–7.7)
Neutrophils Relative %: 71 %
Platelet Count: 176 10*3/uL (ref 150–400)
RBC: 4.44 MIL/uL (ref 4.22–5.81)
RDW: 12.2 % (ref 11.5–15.5)
WBC Count: 4.4 10*3/uL (ref 4.0–10.5)
nRBC: 0 % (ref 0.0–0.2)

## 2022-12-21 MED ORDER — SODIUM CHLORIDE (PF) 0.9 % IJ SOLN
INTRAMUSCULAR | Status: AC
  Filled 2022-12-21: qty 50

## 2022-12-21 MED ORDER — IOHEXOL 300 MG/ML  SOLN
100.0000 mL | Freq: Once | INTRAMUSCULAR | Status: AC | PRN
  Administered 2022-12-21: 100 mL via INTRAVENOUS

## 2022-12-27 ENCOUNTER — Inpatient Hospital Stay (HOSPITAL_BASED_OUTPATIENT_CLINIC_OR_DEPARTMENT_OTHER): Payer: Medicare Other | Admitting: Internal Medicine

## 2022-12-27 ENCOUNTER — Other Ambulatory Visit: Payer: Self-pay

## 2022-12-27 VITALS — BP 124/79 | HR 91 | Temp 97.9°F | Resp 17 | Wt 177.3 lb

## 2022-12-27 DIAGNOSIS — C3490 Malignant neoplasm of unspecified part of unspecified bronchus or lung: Secondary | ICD-10-CM

## 2022-12-27 DIAGNOSIS — C3431 Malignant neoplasm of lower lobe, right bronchus or lung: Secondary | ICD-10-CM | POA: Diagnosis not present

## 2022-12-27 NOTE — Progress Notes (Signed)
[facility] [phone]   [phone]  OFFICE PROGRESS NOTE  Clovis Riley, L.August Saucer, MD [address] [place] [address]  DIAGNOSIS: Metastatic non-small cell lung cancer initially diagnosed as stage IIIb (T2b, N3, M0) non-small cell lung cancer, adenocarcinoma presented with right lower lobe lung nodule in addition to right hilar mass/node as well as ipsilateral and contralateral mediastinal lymphadenopathy diagnosed in October 2020.  The patient has evidence of bone metastases on a PET scan in March 2023.  Molecular studies by [employer] showed no actionable mutations.  PRIOR THERAPY:  1) Concurrent chemoradiation with weekly carboplatin for AUC of 2 and paclitaxel 45 mg/M2.  Status post 7 cycles.  Last dose was given July 21, 2019. 2) Consolidation treatment with immunotherapy with Imfinzi 1500 mg.  Status post 13 cycles.  Last dose was given August 19, 2020. 3) palliative radiotherapy to the right iliac bone as well as the left humerus under the care of Dr. Roselind Messier completed on Dec 16, 2021  CURRENT THERAPY: Observation.  INTERVAL HISTORY: Alan [name] 67 y.o. male returns to the clinic today for follow-up visit.  The patient is feeling fine today with no concerning complaints except for the persistent low back pain but this is much improved after he underwent ablation by interventional radiology.  His pain level is now 5 at scale from 1-10.  He denied having any chest pain, shortness of breath, cough or hemoptysis.  He has no nausea, vomiting, diarrhea or constipation.  He has no headache or visual changes.  He denied having any recent weight loss or night sweats.  Is here today for evaluation with repeat CT scan of the chest, abdomen and pelvis for restaging of his disease.     MEDICAL HISTORY: Past Medical History:  Diagnosis Date   Adenocarcinoma of right lung, stage 3 (HCC) dx'd 03/2019   CAD S/P percutaneous coronary  angioplasty 2004   (Ant STEMI) - Prox LAD 100% => 2 overlapping 3.5 x 1.8 mm Cypher DES stents.;  Patent as of August 2015   Dyslipidemia, goal LDL below 70    Essential hypertension    GERD (gastroesophageal reflux disease)    History of Mitral valve prolapse    Moderate - with moderate MR, noted February t 2013   History of radiation therapy    Right Pelvis & left humerus- 12/05/21-12/16/21- Dr. Antony Blackbird   History of Severe mitral regurgitation by prior echocardiogram 02/06/2014   TEE: Severe mitral regurgitation with a flail P2 segment and ruptured; Normal LV size & function, dilated LA.   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    ALLERGIES:  is allergic to a-cillin [ampicillin], amoxicillin, other, penicillins, sulfa antibiotics, and crestor [rosuvastatin].  MEDICATIONS:  Current Outpatient Medications  Medication Sig Dispense Refill   Ascorbic Acid (VITAMIN C) 1000 MG tablet Take 2,000 mg  by mouth daily.      atorvastatin (LIPITOR) 10 MG tablet TAKE 1 TABLET(10 MG) BY MOUTH DAILY 90 tablet 0   Coenzyme Q10 (CO Q 10) 100 MG CAPS Take 100 mg by mouth daily.      Hypromellose (HYDROXYPROPYL METHYLCELLULOSE) POWD  (Patient not taking: Reported on 10/02/2022)     magnesium 30 MG tablet Take 1 tablet by mouth at bedtime.     Melatonin 10 MG TABS Take 10 mg by mouth at  bedtime.     naltrexone (DEPADE) 50 MG tablet Take by mouth daily. Taking 1 mg daily     Specialty Vitamins Products (PROSTATE PO) Take 1 capsule by mouth daily. PROSTATE FORMULA     traMADol (ULTRAM) 50 MG tablet Take 50 mg by mouth 3 (three) times daily as needed.     vitamin B-12 (CYANOCOBALAMIN) 1000 MCG tablet Take 1,000 mcg by mouth daily.     Vitamin D-Vitamin K (D3 + K2 DOTS PO) Take 1 tablet by mouth daily.     No current facility-administered medications for this visit.    SURGICAL HISTORY:  Past Surgical History:  Procedure Laterality Date   ANTERIOR CRUCIATE LIGAMENT REPAIR Left 1993   COLONOSCOPY     INTRAOPERATIVE TRANSESOPHAGEAL ECHOCARDIOGRAM N/A 04/08/2014   Procedure: INTRAOPERATIVE TRANSESOPHAGEAL ECHOCARDIOGRAM;  Surgeon: Purcell Nails, MD;  Location: [facility] OR;  Service: Open Heart Surgery;  Laterality: N/A;   IR RADIOLOGIST EVAL & MGMT  10/04/2022   IR RADIOLOGIST EVAL & MGMT  11/01/2022   IR RADIOLOGIST EVAL & MGMT  12/06/2022   LEFT AND RIGHT HEART CATHETERIZATION WITH CORONARY ANGIOGRAM N/A 03/05/2014   Procedure: LEFT AND RIGHT HEART CATHETERIZATION WITH CORONARY ANGIOGRAM;  Surgeon: Marykay Lex, MD;  Location: [facility] CATH LAB;  Service: Cardiovascular; (pre-op) Widely patent LAD stents, 40% distal LAD, 30% Cx, ~50% PL-PDA bifurcation lesion    LEFT HEART CATH AND CORONARY ANGIOGRAPHY  2004   In setting of anterior STEMI, found to have 100% % proximal LAD (2 DES Cypher stents)   LEFT HEART CATH AND CORONARY ANGIOGRAPHY  2007   Widely patent LAD stents, 40% distal LAD, 30% Cx, ~50% PL-PDA bifurcation lesion    LEFT HEART CATHETERIZATION WITH CORONARY ANGIOGRAM N/A 09/15/2011   Procedure: LEFT HEART CATHETERIZATION WITH CORONARY ANGIOGRAM;  Surgeon: Runell Gess, MD;  Location: [facility] CATH LAB;  Service: Cardiovascular;  Laterality: N/A;   MITRAL VALVE REPAIR Right 04/08/2014   Procedure: MINIMALLY INVASIVE MITRAL VALVE REPAIR (MVR);  Surgeon: Purcell Nails, MD;   Location: [facility] OR;  Service: Open Heart Surgery;  Laterality: Right;   NM MYOVIEW LTD  12/2009   Walk 9 minutes, and 10 METs, diaphragmatic attenuation but no ischemia or infarction.   SVT ABLATION N/A 10/17/2019   Procedure: SVT ABLATION;  Surgeon: Regan Lemming, MD;  Location: [facility] INVASIVE CV LAB;  Service: Cardiovascular;  Laterality: N/A;   TEE WITHOUT CARDIOVERSION N/A 02/06/2014   Procedure: TRANSESOPHAGEAL ECHOCARDIOGRAM (TEE);  Surgeon: Chrystie Nose, MD;  Normal LV Size U& function - EF 55-60%, no regional WMA.  MV P2 Leaflet is flail with ruptured chord with severe prolapse, anterior leaflet intact.  Severe, eccentric anterior directed MR with dilated LA.   TRANSTHORACIC ECHOCARDIOGRAM  07/18/2018   Normal LV size and function.  EF 50-60 %.  Unable to assess diastolic function.  Mitral valve sewing ring in place.  Mild stenosis with a gradient of 6 mmHg noted. -   TRANSTHORACIC ECHOCARDIOGRAM  09/30/2021  Stable findings.  EF 50 to 55%.  No RWMA.  Mitral valve stable.  No significant MR.  Normal aortic valve.  Mild aortic dilation, but probably normal for age.   VIDEO BRONCHOSCOPY WITH ENDOBRONCHIAL ULTRASOUND N/A 05/16/2019   Procedure: VIDEO BRONCHOSCOPY WITH ENDOBRONCHIAL ULTRASOUND;  Surgeon: Chilton Greathouse, MD;  Location: [facility] OR;  Service: Pulmonary;  Laterality: N/A;    REVIEW OF SYSTEMS:  Constitutional: negative Eyes: negative Ears, nose, mouth, throat, and face: negative Respiratory: negative Cardiovascular: negative Gastrointestinal: negative Genitourinary:negative Integument/breast: negative Hematologic/lymphatic: negative Musculoskeletal:positive for back pain Neurological: negative Behavioral/Psych: negative Endocrine: negative Allergic/Immunologic: negative   PHYSICAL EXAMINATION: General appearance: alert, cooperative, and no distress Head: Normocephalic, without obvious abnormality, atraumatic Neck: no adenopathy, no JVD, supple, symmetrical, trachea  midline, and thyroid not enlarged, symmetric, no tenderness/mass/nodules Lymph nodes: Cervical, supraclavicular, and axillary nodes normal. Resp: clear to auscultation bilaterally Back: symmetric, no curvature. ROM normal. No CVA tenderness. Cardio: regular rate and rhythm, S1, S2 normal, no murmur, click, rub or gallop GI: soft, non-tender; bowel sounds normal; no masses,  no organomegaly Extremities: extremities normal, atraumatic, no cyanosis or edema Neurologic: Alert and oriented X 3, normal strength and tone. Normal symmetric reflexes. Normal coordination and gait  ECOG PERFORMANCE STATUS: 1 - Symptomatic but completely ambulatory  Blood pressure 124/79, pulse 91, temperature 97.9 F (36.6 C), temperature source Oral, resp. rate 17, weight 177 lb 4.8 oz (80.4 kg), SpO2 100 %.  LABORATORY DATA: Lab Results  Component Value Date   WBC 4.4 12/21/2022   HGB 14.4 12/21/2022   HCT 42.7 12/21/2022   MCV 96.2 12/21/2022   PLT 176 12/21/2022      Chemistry      Component Value Date/Time   NA 142 12/21/2022 1038   NA 141 02/25/2020 0915   K 4.0 12/21/2022 1038   CL 106 12/21/2022 1038   CO2 31 12/21/2022 1038   BUN 19 12/21/2022 1038   BUN 14 02/25/2020 0915   CREATININE 0.77 12/21/2022 1038   CREATININE 0.80 02/27/2014 1152      Component Value Date/Time   CALCIUM 9.7 12/21/2022 1038   ALKPHOS 70 12/21/2022 1038   AST 23 12/21/2022 1038   ALT 20 12/21/2022 1038   BILITOT 1.4 (H) 12/21/2022 1038       RADIOGRAPHIC STUDIES: CT Chest W Contrast  Result Date: 12/25/2022 CLINICAL DATA:  Staging non-small-cell lung cancer. Right lung adenocarcinoma. Metastatic disease to left humerus and right iliac bone as well as the pelvis. * Tracking Code: BO * EXAM: CT CHEST, ABDOMEN, AND PELVIS WITH CONTRAST TECHNIQUE: Multidetector CT imaging of the chest, abdomen and pelvis was performed following the standard protocol during bolus administration of intravenous contrast. RADIATION  DOSE REDUCTION: This exam was performed according to the departmental dose-optimization program which includes automated exposure control, adjustment of the mA and/or kV according to patient size and/or use of iterative reconstruction technique. CONTRAST:  OMNIPAQUE IOHEXOL 300 MG/ML  SOLN COMPARISON:  Chest CT 09/22/2022 and older. Abdomen pelvis CT 06/22/2022 and older. PET-CT 09/22/2022. FINDINGS: CT CHEST FINDINGS Cardiovascular: Heart is nonenlarged. No pericardial effusion. Thoracic aorta has a normal course and caliber with mild atherosclerotic changes. Diameter of the ascending aorta on today's examination of 3.9 cm. Coronary artery calcifications are seen. Mitral valve prosthesis noted. Mediastinum/Nodes: No specific abnormal lymph node enlargement identified in the axillary regions, hilum or mediastinum. No pericardial effusion. Normal caliber thoracic esophagus. Lungs/Pleura: Left lung is grossly clear. No consolidation, pneumothorax or effusion. There is  volume loss of the right hemithorax with bandlike thickening perihilar consistent with a posttreatment change. Appearance is similar to previous. There are some areas of pleural thickening. No new mass lesion in the right lung, effusion or pneumothorax. Musculoskeletal: Mild degenerative changes are again seen. There are sclerotic foci such as left humerus, upper sternum and vertebral bodies at T3, T11. Distribution appears similar to previous. CT ABDOMEN PELVIS FINDINGS Hepatobiliary: Patent portal vein. Gallbladder is present. There is tiny low-attenuation lesion seen in segment 2 of the liver, too small to completely characterize likely small cysts and no specific imaging follow up. This is stable from previous. There is also a small enhancing area in the dome on series 2, image 57 measuring 8 mm. This could be an area of shunting or flash fill hemangioma. Similar study more posterior in the right hepatic lobe on series 2, image 63. Stable from  previous. No additional imaging follow-up of these small lesions. Pancreas: Unremarkable. No pancreatic ductal dilatation or surrounding inflammatory changes. Spleen: Normal in size without focal abnormality. Adrenals/Urinary Tract: The adrenal glands are preserved. No enhancing renal mass, collecting system dilatation. There is tiny low-attenuation lesion seen in the superior aspect of the right kidney on series 6, image 11 measuring 8 mm. Benign cyst. Bosniak 2 lesion. No specific imaging follow-up. The ureters have normal course and caliber down to the bladder. Preserved contours of the urinary bladder. Stomach/Bowel: Stomach is nondilated. No oral contrast was provided. The small bowel has a normal course and caliber. Moderate diffuse colonic stool. The large bowel has a normal course and caliber. Normal appendix in the right lower quadrant. Vascular/Lymphatic: Aortic atherosclerosis. No enlarged abdominal or pelvic lymph nodes. Reproductive: Prostate is unremarkable. Other: No free air or free fluid. Once again there is a mesenteric cystic lesion on the right side and such as series 2, image 96 similar to older examinations and likely a benign lesion such as a lymphangioma. Musculoskeletal: Scattered degenerative changes of the spine and pelvis. Once again there are areas of sclerosis involving the right iliac bone, right hemi sacrum right-greater-than-left. Known bony metastatic lesions. IMPRESSION: Overall no significant interval change. Multifocal sclerotic bone metastases. Stable posttreatment changes along the right lung with volume loss and bandlike opacity. Moderate colonic stool. Right-sided mesenteric cystic lesion with benign features and stability compared to older examinations. Probable benign lesion such as a lymphangioma Electronically Signed   By: Karen Kays M.D.   On: 12/25/2022 10:43   CT Abdomen Pelvis W Contrast  Result Date: 12/25/2022 CLINICAL DATA:  Staging non-small-cell lung  cancer. Right lung adenocarcinoma. Metastatic disease to left humerus and right iliac bone as well as the pelvis. * Tracking Code: BO * EXAM: CT CHEST, ABDOMEN, AND PELVIS WITH CONTRAST TECHNIQUE: Multidetector CT imaging of the chest, abdomen and pelvis was performed following the standard protocol during bolus administration of intravenous contrast. RADIATION DOSE REDUCTION: This exam was performed according to the departmental dose-optimization program which includes automated exposure control, adjustment of the mA and/or kV according to patient size and/or use of iterative reconstruction technique. CONTRAST:  OMNIPAQUE IOHEXOL 300 MG/ML  SOLN COMPARISON:  Chest CT 09/22/2022 and older. Abdomen pelvis CT 06/22/2022 and older. PET-CT 09/22/2022. FINDINGS: CT CHEST FINDINGS Cardiovascular: Heart is nonenlarged. No pericardial effusion. Thoracic aorta has a normal course and caliber with mild atherosclerotic changes. Diameter of the ascending aorta on today's examination of 3.9 cm. Coronary artery calcifications are seen. Mitral valve prosthesis noted. Mediastinum/Nodes: No specific abnormal lymph node  enlargement identified in the axillary regions, hilum or mediastinum. No pericardial effusion. Normal caliber thoracic esophagus. Lungs/Pleura: Left lung is grossly clear. No consolidation, pneumothorax or effusion. There is volume loss of the right hemithorax with bandlike thickening perihilar consistent with a posttreatment change. Appearance is similar to previous. There are some areas of pleural thickening. No new mass lesion in the right lung, effusion or pneumothorax. Musculoskeletal: Mild degenerative changes are again seen. There are sclerotic foci such as left humerus, upper sternum and vertebral bodies at T3, T11. Distribution appears similar to previous. CT ABDOMEN PELVIS FINDINGS Hepatobiliary: Patent portal vein. Gallbladder is present. There is tiny low-attenuation lesion seen in segment 2 of the  liver, too small to completely characterize likely small cysts and no specific imaging follow up. This is stable from previous. There is also a small enhancing area in the dome on series 2, image 57 measuring 8 mm. This could be an area of shunting or flash fill hemangioma. Similar study more posterior in the right hepatic lobe on series 2, image 63. Stable from previous. No additional imaging follow-up of these small lesions. Pancreas: Unremarkable. No pancreatic ductal dilatation or surrounding inflammatory changes. Spleen: Normal in size without focal abnormality. Adrenals/Urinary Tract: The adrenal glands are preserved. No enhancing renal mass, collecting system dilatation. There is tiny low-attenuation lesion seen in the superior aspect of the right kidney on series 6, image 11 measuring 8 mm. Benign cyst. Bosniak 2 lesion. No specific imaging follow-up. The ureters have normal course and caliber down to the bladder. Preserved contours of the urinary bladder. Stomach/Bowel: Stomach is nondilated. No oral contrast was provided. The small bowel has a normal course and caliber. Moderate diffuse colonic stool. The large bowel has a normal course and caliber. Normal appendix in the right lower quadrant. Vascular/Lymphatic: Aortic atherosclerosis. No enlarged abdominal or pelvic lymph nodes. Reproductive: Prostate is unremarkable. Other: No free air or free fluid. Once again there is a mesenteric cystic lesion on the right side and such as series 2, image 96 similar to older examinations and likely a benign lesion such as a lymphangioma. Musculoskeletal: Scattered degenerative changes of the spine and pelvis. Once again there are areas of sclerosis involving the right iliac bone, right hemi sacrum right-greater-than-left. Known bony metastatic lesions. IMPRESSION: Overall no significant interval change. Multifocal sclerotic bone metastases. Stable posttreatment changes along the right lung with volume loss and  bandlike opacity. Moderate colonic stool. Right-sided mesenteric cystic lesion with benign features and stability compared to older examinations. Probable benign lesion such as a lymphangioma Electronically Signed   By: Karen Kays M.D.   On: 12/25/2022 10:43   CT BONE TUMOR(S)RF ABLATION  Result Date: 12/19/2022 INDICATION: Iliac bone lesion, painful bony metastasis in the setting of metastatic lung adenocarcinoma refractory to external beam radiation EXAM: CT-guided radiofrequency ablation of right iliac bone tumor MEDICATIONS: Documented in the EMR ANESTHESIA/SEDATION: Moderate (conscious) sedation was employed during this procedure. A total of Versed 4 mg and Fentanyl 200 mcg was administered intravenously. Moderate Sedation Time: 75 minutes. The patient's level of consciousness and vital signs were monitored continuously by radiology nursing throughout the procedure under my direct supervision. CONTRAST:  None FLUOROSCOPY: N/a COMPLICATIONS: None immediate. TECHNIQUE: See below PROCEDURE: See below FINDINGS: Informed written consent was obtained from the patient after a thorough discussion of the procedural risks, benefits and alternatives. All questions were addressed. Maximal Sterile Barrier Technique was utilized including caps, mask, sterile gowns, sterile gloves, sterile drape, hand hygiene and skin  antiseptic. A timeout was performed prior to the initiation of the procedure. The patient was positioned prone on the exam table. A single probe access was planned. Skin entry site(s) were marked overlying the right posterior ilium. The overlying skin was then prepped and draped in the standard sterile fashion. Local analgesia was obtained with 1% lidocaine. Under intermittent CT fluoroscopy, a 10 gauge introducer needle was advanced into the sclerotic lesion in the posterior ilium. The bone was noted to be heavily sclerotic. After confirming location with CT, the 20 mm RF ablation probe was then advanced  through needles and locked into position. Appropriate location within the iliac bone was confirmed with multiple projections. RF ablation was then performed for 15 minutes at 70 degrees Celsius. This was repeated for 2 separate ablation sites along the length of the sclerotic iliac bone lesion. The patient tolerated this portion of the procedure well without complication. At the end of the procedure, the introducer cannula and RF ablation Probe were then removed without difficulty. Clean dressings were placed after hemostasis. The patient tolerated all aspects of the procedure well, and was transferred to recovery in stable condition. IMPRESSION: Successful CT-guided radiofrequency ablation of sclerotic right iliac bone lesion, second treatment session. Electronically Signed   By: Olive Bass M.D.   On: 12/19/2022 13:44   IR Radiologist Eval & Mgmt  Result Date: 12/06/2022 EXAM: ESTABLISHED PATIENT OFFICE VISIT CHIEF COMPLAINT: Refer to EMR HISTORY OF PRESENT ILLNESS: Although the patient reported approximately 20% improvement in his pain at the time of his previous visit stating that his pain had improved from 9/10 to 7/10, the patient reports that his pain is no different to slightly worse, with his pain averaging about 8/10. He reports that the pain is generally fairly constant, and worse with activity. On today's visit, he reports similar to worsening pain despite initial improvement. Given that the lesion has not been completely treated, I think it is reasonable to schedule additional treatment session with 1 or 2 additional ablation sites planned surrounding the original ablation. The patient was in agreement, and will be scheduled accordingly. REVIEW OF SYSTEMS: Refer to EMR PHYSICAL EXAMINATION: Refer to EMR ASSESSMENT AND PLAN: Refer to EMR Electronically Signed   By: Olive Bass M.D.   On: 12/06/2022 12:01     ASSESSMENT AND PLAN: This is a very pleasant 67 years old white male recently  diagnosed with likely metastatic non-small cell lung cancer initially diagnosed as stage IIIb non-small cell lung cancer, adenocarcinoma with no actionable mutation.  He completed a course of concurrent chemoradiation with weekly carboplatin and paclitaxel status post 7 cycles.   His treatment was complicated with dysphagia, odynophagia secondary to radiation induced esophagitis.  He also has persistent cough and shortness of breath secondary to radiation-induced pneumonitis. He has partial response after this treatment. The patient completed treatment with consolidation immunotherapy with Imfinzi 1500 mg IV every 4 weeks, status post 13 cycles.  Last dose was in January 2022. The patient tolerated his previous treatment fairly well with no concerning adverse effects. The patient had evidence for disease metastasis to the left humerus as well as medial right iliac wing and sacrum in March 2023 that was biopsy-proven to be metastatic non-small cell lung cancer, adenocarcinoma. The patient received palliative radiotherapy to these areas under the care of Dr. Roselind Messier.. The patient is feeling fine today with no concerning complaints except for the persistent back pain. He underwent ablation by orthopedic surgery but it was not helpful. He  recently underwent ablation by interventional radiology twice and his pain level has significantly improved. He had repeat CT scan of the chest, abdomen and pelvis performed recently.  I personally and independently reviewed the scan and discussed the result with the patient today. His scan showed no concerning findings for disease recurrence or metastasis. I recommended for him to continue on observation with repeat CT scan of the chest, abdomen and pelvis in 6 months but the patient was advised to call immediately if he has any concerning symptoms in the interval. For the back pain, he will continue his routine evaluation and management by interventional radiology.  The  patient voices understanding of current disease status and treatment options and is in agreement with the current care plan. All questions were answered. The patient knows to call the clinic with any problems, questions or concerns. We can certainly see the patient much sooner if necessary. The total time spent in the appointment was 30 minutes.  Disclaimer: This note was dictated with voice recognition software. Similar sounding words can inadvertently be transcribed and may not be corrected upon review.

## 2023-01-23 ENCOUNTER — Ambulatory Visit
Admission: RE | Admit: 2023-01-23 | Discharge: 2023-01-23 | Disposition: A | Discharge status: Home / Self Care | Payer: Medicare Other | Source: Ambulatory Visit | Attending: Radiology | Admitting: Radiology

## 2023-01-23 ENCOUNTER — Other Ambulatory Visit: Payer: Self-pay | Admitting: Interventional Radiology

## 2023-01-23 DIAGNOSIS — G8929 Other chronic pain: Secondary | ICD-10-CM

## 2023-01-23 DIAGNOSIS — C7951 Secondary malignant neoplasm of bone: Secondary | ICD-10-CM

## 2023-01-23 HISTORY — PX: IR RADIOLOGIST EVAL & MGMT: IMG5224

## 2023-01-23 NOTE — Progress Notes (Signed)
Interventional Radiology - Telephone Visit    History of Present Illness  AARNAV NEGLIA is a 67 y.o. male with a relevant history of metastatic lung adenocarcinoma with multifocal osseous metastatic disease including to the right ilium and refractory back pain, seen in telephone visit for follow up.    I spoke with Mr. Reinoehl over the phone today.  He is now status post second session CT guided radiofrequency ablation of his right iliac bone lesion.  He completed his first session with me on October 16, 2022, with some initial pain relief that then gradually returned close to his baseline.  He completed his second session with me on Dec 19, 2022, and is now seen in 1 month follow-up.  His baseline pain was reportedly 9-10/10, particularly worse with bending over, and has now much improved stating that it has gone to as low as 5/10 on some days.  The pain is much less severe.    Past medical and surgical history reviewed. No interval changes. No interval hospitalizations.   Medications  I have reviewed the current medication list. Refer to chart for details. Current Outpatient Medications  Medication Instructions   atorvastatin (LIPITOR) 10 MG tablet TAKE 1 TABLET(10 MG) BY MOUTH DAILY   Co Q 10 100 mg, Daily   cyanocobalamin (VITAMIN B12) 1,000 mcg, Oral, Daily   Hypromellose (HYDROXYPROPYL METHYLCELLULOSE) POWD No dose, route, or frequency recorded.   magnesium 30 MG tablet 1 tablet, Oral, Daily at bedtime   Melatonin 10 mg, Oral, Daily at bedtime   naltrexone (DEPADE) 50 MG tablet Oral, Daily, Taking 1 mg daily   Specialty Vitamins Products (PROSTATE PO) 1 capsule, Oral, Daily, PROSTATE FORMULA    traMADol (ULTRAM) 50 mg, Oral, 3 times daily PRN   vitamin C 2,000 mg, Oral, Daily   Vitamin D-Vitamin K (D3 + K2 DOTS PO) 1 tablet, Oral, Daily       Pertinent Lab Results    Latest Ref Rng & Units 12/21/2022   10:38 AM 12/19/2022    7:59 AM 10/16/2022    9:38 AM  CBC  WBC 4.0 - 10.5 K/uL  4.4  4.0  3.4   Hemoglobin 13.0 - 17.0 g/dL 16.1  09.6  04.5   Hematocrit 39.0 - 52.0 % 42.7  43.7  42.5   Platelets 150 - 400 K/uL 176  178  177       Latest Ref Rng & Units 12/21/2022   10:38 AM 12/19/2022    7:59 AM 10/16/2022    9:38 AM  CMP  Glucose 70 - 99 mg/dL 99  94  99   BUN 8 - 23 mg/dL 19  18  16    Creatinine 0.61 - 1.24 mg/dL 4.09  8.11  9.14   Sodium 135 - 145 mmol/L 142  140  141   Potassium 3.5 - 5.1 mmol/L 4.0  3.8  3.9   Chloride 98 - 111 mmol/L 106  106  107   CO2 22 - 32 mmol/L 31  27  27    Calcium 8.9 - 10.3 mg/dL 9.7  9.5  9.6   Total Protein 6.5 - 8.1 g/dL 7.0     Total Bilirubin 0.3 - 1.2 mg/dL 1.4     Alkaline Phos 38 - 126 U/L 70     AST 15 - 41 U/L 23     ALT 0 - 44 U/L 20        Relevant and/or Recent Imaging: None    Assessment &  Plan ZACK CRAGER is a 67 y.o. male with a relevant history of metastatic lung adenocarcinoma with multifocal osseous metastatic disease including to the right ilium and refractory back pain, seen in telephone visit for follow up.    He is now status post 2 separate radiofrequency ablation sessions for his back pain related to his large right iliac bone metastasis.  He has had significant improvement in the severe aspect of his pain, although he desires for continued improvement.  I explained that realistically his pain may never completely resolve.  I suspect that we have achieved the best possible outcome with 2 separate ablations with coverage of at least 80-90% of the tumor volume.  Additional ablation would likely be chasing diminishing returns, and I would not expect any further significant benefit.  However, given the location of his pain and involvement of the lesion along the right SI joint, a targeted steroid injection of the right SI joint may continue to improve his pain now that the majority of the tumor has been successfully ablated.  The patient is agreeable, and I will schedule him accordingly.    Plan:  1. CT  guided right SI joint steroid injection      I spent a total of 25 Minutes in face-to-face in clinical consultation, greater than 50% of which was spent on medical decision-making and counseling/coordinating care for back pain.    Visit type: Audio only (telephone). Audio (no video) only due to patient's lack of internet/smartphone capability. Alternative for in-person consultation at [facility], [address], [place], [place]. This visit type was conducted due to national recommendations for restrictions regarding the COVID-19 Pandemic (e.g. social distancing).  This format is felt to be most appropriate for this patient at this time.  All issues noted in this document were discussed and addressed.      Olive Bass, MD  Vascular and Interventional Radiology 01/23/2023 3:06 PM

## 2023-01-24 ENCOUNTER — Other Ambulatory Visit: Payer: Federal, State, Local not specified - PPO

## 2023-01-31 ENCOUNTER — Ambulatory Visit
Admission: RE | Admit: 2023-01-31 | Discharge: 2023-01-31 | Disposition: A | Discharge status: Home / Self Care | Payer: Medicare Other | Source: Ambulatory Visit | Attending: Interventional Radiology

## 2023-01-31 ENCOUNTER — Ambulatory Visit
Admission: RE | Admit: 2023-01-31 | Discharge: 2023-01-31 | Disposition: A | Discharge status: Home / Self Care | Payer: Medicare Other | Source: Ambulatory Visit | Attending: Interventional Radiology | Admitting: Interventional Radiology

## 2023-01-31 DIAGNOSIS — G8929 Other chronic pain: Secondary | ICD-10-CM

## 2023-02-20 ENCOUNTER — Other Ambulatory Visit: Payer: Self-pay | Admitting: Internal Medicine

## 2023-02-20 ENCOUNTER — Telehealth: Payer: Self-pay | Admitting: Medical Oncology

## 2023-02-20 NOTE — Telephone Encounter (Signed)
Faxed request to call me

## 2023-02-20 NOTE — Telephone Encounter (Signed)
Requested referral to [facility] in [place] to evaluate his "inoperable golf ball size lesion".

## 2023-03-05 ENCOUNTER — Telehealth: Payer: Self-pay | Admitting: Medical Oncology

## 2023-03-05 NOTE — Telephone Encounter (Signed)
I told pt the  [facility] received the faxed referral and information. I gave him the number to call the [facility] to complete the registration process -[phone].

## 2023-03-22 ENCOUNTER — Telehealth: Payer: Self-pay | Admitting: Medical Oncology

## 2023-03-22 NOTE — Telephone Encounter (Signed)
[facility] denied referral . Pt aware.He is taking " Relief Factor " 4 tabs TID-contains Nitros Oxide and other ingredients.

## 2023-04-02 ENCOUNTER — Telehealth: Payer: Self-pay | Admitting: Medical Oncology

## 2023-04-02 NOTE — Telephone Encounter (Signed)
[facility] declined referral . Pt asking for appt to discuss current situation.Next appt November.

## 2023-04-05 ENCOUNTER — Inpatient Hospital Stay: Payer: Medicare Other | Attending: Internal Medicine | Admitting: Internal Medicine

## 2023-04-05 VITALS — BP 112/87 | HR 85 | Temp 97.6°F | Resp 17 | Ht 74.0 in | Wt 180.0 lb

## 2023-04-05 DIAGNOSIS — C3431 Malignant neoplasm of lower lobe, right bronchus or lung: Secondary | ICD-10-CM | POA: Diagnosis present

## 2023-04-05 DIAGNOSIS — M546 Pain in thoracic spine: Secondary | ICD-10-CM | POA: Diagnosis not present

## 2023-04-05 DIAGNOSIS — C7951 Secondary malignant neoplasm of bone: Secondary | ICD-10-CM | POA: Insufficient documentation

## 2023-04-05 DIAGNOSIS — C3491 Malignant neoplasm of unspecified part of right bronchus or lung: Secondary | ICD-10-CM

## 2023-04-05 NOTE — Progress Notes (Signed)
[facility] [phone]   [phone]  OFFICE PROGRESS NOTE  Clovis Riley, L.August Saucer, MD [address] [place] [address]  DIAGNOSIS: Metastatic non-small cell lung cancer initially diagnosed as stage IIIb (T2b, N3, M0) non-small cell lung cancer, adenocarcinoma presented with right lower lobe lung nodule in addition to right hilar mass/node as well as ipsilateral and contralateral mediastinal lymphadenopathy diagnosed in October 2020.  The patient has evidence of bone metastases on a PET scan in March 2023.  Molecular studies by [employer] showed no actionable mutations.  PRIOR THERAPY:  1) Concurrent chemoradiation with weekly carboplatin for AUC of 2 and paclitaxel 45 mg/M2.  Status post 7 cycles.  Last dose was given July 21, 2019. 2) Consolidation treatment with immunotherapy with Imfinzi 1500 mg.  Status post 13 cycles.  Last dose was given August 19, 2020. 3) palliative radiotherapy to the right iliac bone as well as the left humerus under the care of Dr. Roselind Messier completed on Dec 16, 2021  CURRENT THERAPY: Observation.  INTERVAL HISTORY: Alan [name] 67 y.o. male is to the clinic today for follow-up visit.  The patient is very frustrated with his persistent back pain and limitation of movement from the metastatic bone lesion at the right T11.  He was seen by interventional radiology and had ablation as well as steroid injection few times recently.  His pain improved initially but now back to around 9 on a scale from 1-10.  He is using tramadol on as-needed basis.  He is seeing another orthopedic group in town to see if they can help with his metastatic bone disease.  He has no current nausea, vomiting, diarrhea or constipation.  He has no headache or visual changes.  He has no recent weight loss or night sweats.  He is here today for evaluation and discussion of his condition   MEDICAL HISTORY: Past Medical History:  Diagnosis  Date   Adenocarcinoma of right lung, stage 3 (HCC) dx'd 03/2019   CAD S/P percutaneous coronary angioplasty 2004   (Ant STEMI) - Prox LAD 100% => 2 overlapping 3.5 x 1.8 mm Cypher DES stents.;  Patent as of August 2015   Dyslipidemia, goal LDL below 70    Essential hypertension    GERD (gastroesophageal reflux disease)    History of Mitral valve prolapse    Moderate - with moderate MR, noted February t 2013   History of radiation therapy    Right Pelvis & left humerus- 12/05/21-12/16/21- Dr. Antony Blackbird   History of Severe mitral regurgitation by prior echocardiogram 02/06/2014   TEE: Severe mitral regurgitation with a flail P2 segment and ruptured; Normal LV size & function, dilated LA.   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    ALLERGIES:  is allergic to a-cillin [ampicillin], amoxicillin, other, penicillins, sulfa antibiotics, and crestor [rosuvastatin].  MEDICATIONS:  Current Outpatient Medications  Medication Sig Dispense Refill   Ascorbic Acid (VITAMIN C) 1000 MG tablet Take 2,000 mg by mouth daily.      atorvastatin (LIPITOR) 10 MG tablet TAKE 1 TABLET(10 MG) BY MOUTH DAILY 90 tablet 0   Coenzyme Q10 (CO Q 10) 100 MG CAPS Take 100 mg by mouth daily.      Hypromellose (HYDROXYPROPYL METHYLCELLULOSE) POWD  (Patient not taking: Reported on 10/02/2022)     magnesium 30  MG tablet Take 1 tablet by mouth at bedtime.     Melatonin 10 MG TABS Take 10 mg by mouth at bedtime.     naltrexone (DEPADE) 50 MG tablet Take by mouth daily. Taking 1 mg daily     OVER THE COUNTER MEDICATION Take 4 tablets by mouth in the morning, at noon, and at bedtime. " Relief Factor " from online.     Specialty Vitamins Products (PROSTATE PO) Take 1 capsule by mouth daily. PROSTATE FORMULA     traMADol (ULTRAM) 50 MG tablet Take 50 mg by mouth 3 (three) times daily as needed.     vitamin B-12 (CYANOCOBALAMIN) 1000 MCG tablet Take 1,000 mcg by mouth daily.     Vitamin D-Vitamin K (D3 + K2 DOTS PO) Take 1 tablet by mouth daily.     No current facility-administered medications for this visit.    SURGICAL HISTORY:  Past Surgical History:  Procedure Laterality Date   ANTERIOR CRUCIATE LIGAMENT REPAIR Left 1993   COLONOSCOPY     INTRAOPERATIVE TRANSESOPHAGEAL ECHOCARDIOGRAM N/A 04/08/2014   Procedure: INTRAOPERATIVE TRANSESOPHAGEAL ECHOCARDIOGRAM;  Surgeon: Purcell Nails, MD;  Location: [facility] OR;  Service: Open Heart Surgery;  Laterality: N/A;   IR RADIOLOGIST EVAL & MGMT  10/04/2022   IR RADIOLOGIST EVAL & MGMT  11/01/2022   IR RADIOLOGIST EVAL & MGMT  12/06/2022   IR RADIOLOGIST EVAL & MGMT  01/23/2023   LEFT AND RIGHT HEART CATHETERIZATION WITH CORONARY ANGIOGRAM N/A 03/05/2014   Procedure: LEFT AND RIGHT HEART CATHETERIZATION WITH CORONARY ANGIOGRAM;  Surgeon: Marykay Lex, MD;  Location: [facility] CATH LAB;  Service: Cardiovascular; (pre-op) Widely patent LAD stents, 40% distal LAD, 30% Cx, ~50% PL-PDA bifurcation lesion    LEFT HEART CATH AND CORONARY ANGIOGRAPHY  2004   In setting of anterior STEMI, found to have 100% % proximal LAD (2 DES Cypher stents)   LEFT HEART CATH AND CORONARY ANGIOGRAPHY  2007   Widely patent LAD stents, 40% distal LAD, 30% Cx, ~50% PL-PDA bifurcation lesion    LEFT HEART CATHETERIZATION WITH CORONARY ANGIOGRAM N/A 09/15/2011   Procedure: LEFT HEART  CATHETERIZATION WITH CORONARY ANGIOGRAM;  Surgeon: Runell Gess, MD;  Location: [facility] CATH LAB;  Service: Cardiovascular;  Laterality: N/A;   MITRAL VALVE REPAIR Right 04/08/2014   Procedure: MINIMALLY INVASIVE MITRAL VALVE REPAIR (MVR);  Surgeon: Purcell Nails, MD;  Location: [facility] OR;  Service: Open Heart Surgery;  Laterality: Right;   NM MYOVIEW LTD  12/2009   Walk 9 minutes, and 10 METs, diaphragmatic attenuation but no ischemia or infarction.   SVT ABLATION N/A 10/17/2019   Procedure: SVT ABLATION;  Surgeon: Regan Lemming, MD;  Location: [facility] INVASIVE CV LAB;  Service: Cardiovascular;  Laterality: N/A;   TEE WITHOUT CARDIOVERSION N/A 02/06/2014   Procedure: TRANSESOPHAGEAL ECHOCARDIOGRAM (TEE);  Surgeon: Chrystie Nose, MD;  Normal LV Size U& function - EF 55-60%, no regional WMA.  MV P2 Leaflet is flail with ruptured chord with severe prolapse, anterior leaflet intact.  Severe, eccentric anterior directed MR with dilated LA.   TRANSTHORACIC ECHOCARDIOGRAM  07/18/2018   Normal LV size and function.  EF 50-60 %.  Unable to assess diastolic function.  Mitral valve sewing ring in place.  Mild stenosis with a gradient of 6 mmHg noted. -   TRANSTHORACIC ECHOCARDIOGRAM  09/30/2021   Stable findings.  EF 50 to 55%.  No RWMA.  Mitral valve stable.  No significant MR.  Normal aortic valve.  Mild aortic dilation, but probably normal for age.   VIDEO BRONCHOSCOPY WITH ENDOBRONCHIAL ULTRASOUND N/A 05/16/2019   Procedure: VIDEO BRONCHOSCOPY WITH ENDOBRONCHIAL ULTRASOUND;  Surgeon: Chilton Greathouse, MD;  Location: [facility] OR;  Service: Pulmonary;  Laterality: N/A;    REVIEW OF SYSTEMS:  Constitutional: positive for fatigue Eyes: negative Ears, nose, mouth, throat, and face: negative Respiratory: negative Cardiovascular: negative Gastrointestinal: negative Genitourinary:negative Integument/breast: negative Hematologic/lymphatic: negative Musculoskeletal:positive for back pain Neurological:  negative Behavioral/Psych: negative Endocrine: negative Allergic/Immunologic: negative   PHYSICAL EXAMINATION: General appearance: alert, cooperative, and no distress Head: Normocephalic, without obvious abnormality, atraumatic Neck: no adenopathy, no JVD, supple, symmetrical, trachea midline, and thyroid not enlarged, symmetric, no tenderness/mass/nodules Lymph nodes: Cervical, supraclavicular, and axillary nodes normal. Resp: clear to auscultation bilaterally Back: symmetric, no curvature. ROM normal. No CVA tenderness. Cardio: regular rate and rhythm, S1, S2 normal, no murmur, click, rub or gallop GI: soft, non-tender; bowel sounds normal; no masses,  no organomegaly Extremities: extremities normal, atraumatic, no cyanosis or edema Neurologic: Alert and oriented X 3, normal strength and tone. Normal symmetric reflexes. Normal coordination and gait  ECOG PERFORMANCE STATUS: 1 - Symptomatic but completely ambulatory  Blood pressure 112/87, pulse 85, temperature 97.6 F (36.4 C), temperature source Oral, resp. rate 17, height 6\' 2"  (1.88 m), weight 180 lb (81.6 kg), SpO2 100%.  LABORATORY DATA: Lab Results  Component Value Date   WBC 4.4 12/21/2022   HGB 14.4 12/21/2022   HCT 42.7 12/21/2022   MCV 96.2 12/21/2022   PLT 176 12/21/2022      Chemistry      Component Value Date/Time   NA 142 12/21/2022 1038   NA 141 02/25/2020 0915   K 4.0 12/21/2022 1038   CL 106 12/21/2022 1038   CO2 31 12/21/2022 1038   BUN 19 12/21/2022 1038   BUN 14 02/25/2020 0915   CREATININE 0.77 12/21/2022 1038   CREATININE 0.80 02/27/2014 1152      Component Value Date/Time   CALCIUM 9.7 12/21/2022 1038   ALKPHOS 70 12/21/2022 1038   AST 23 12/21/2022 1038   ALT 20 12/21/2022 1038   BILITOT 1.4 (H) 12/21/2022 1038       RADIOGRAPHIC STUDIES: No results found.   ASSESSMENT AND PLAN: This is a very pleasant 67 years old white male recently diagnosed with likely metastatic non-small cell  lung cancer initially diagnosed as stage IIIb non-small cell lung cancer, adenocarcinoma with no actionable mutation.  He completed a course of concurrent chemoradiation with weekly carboplatin and paclitaxel status post 7 cycles.   His treatment was complicated with dysphagia, odynophagia secondary to radiation induced esophagitis.  He also has persistent cough and shortness of breath secondary to radiation-induced pneumonitis. He has partial response after this treatment. The patient completed treatment with consolidation immunotherapy with Imfinzi 1500 mg IV every 4 weeks, status post 13 cycles.  Last dose was in January 2022. The patient tolerated his previous treatment fairly well with no concerning adverse effects. The patient had evidence for disease metastasis to the left  humerus as well as medial right iliac wing and sacrum in March 2023 that was biopsy-proven to be metastatic non-small cell lung cancer, adenocarcinoma. The patient received palliative radiotherapy to these areas under the care of Dr. Roselind Messier.. The patient is feeling fine today with no concerning complaints except for the persistent back pain. He underwent ablation by orthopedic surgery but it was not helpful. He recently underwent ablation by interventional radiology twice and his pain level has significantly improved.  But this started increasing again.  He tried to reach to his spine surgery group at [facility] in [place] but unfortunately his request and referral was declined. I had a lengthy discussion with the patient today about his condition and option to manage his pain.  I offered him referral to the palliative care team at the cancer center versus referral to a pain clinic with Dr. Merton Border in [place] but the patient is not much interested in the pain management as much as he is interested in resolution of his bone disease. His last imaging study showed no evidence of disease progression and he is not  interested in starting any systemic therapy at this point. We may consider referring him to neurosurgery at some point to see if they have any additional option. For the back pain, he will continue his routine evaluation and management by interventional radiology. I will see him back for follow-up visit in November 2024 for evaluation with repeat imaging studies. He was advised to call immediately if he has any other concerning symptoms in the interval. The patient voices understanding of current disease status and treatment options and is in agreement with the current care plan. All questions were answered. The patient knows to call the clinic with any problems, questions or concerns. We can certainly see the patient much sooner if necessary. The total time spent in the appointment was 30 minutes.  Disclaimer: This note was dictated with voice recognition software. Similar sounding words can inadvertently be transcribed and may not be corrected upon review.

## 2023-04-23 ENCOUNTER — Ambulatory Visit: Payer: Federal, State, Local not specified - PPO | Admitting: Cardiology

## 2023-05-04 ENCOUNTER — Telehealth: Payer: Self-pay | Admitting: Cardiology

## 2023-05-04 NOTE — Telephone Encounter (Signed)
   Pt c/o of Chest Pain: STAT if active CP, including tightness, pressure, jaw pain, radiating pain to shoulder/upper arm/back, CP unrelieved by Nitro. Symptoms reported of SOB, nausea, vomiting, sweating.  1. Are you having CP right now?   No  2. Are you experiencing any other symptoms (ex. SOB, nausea, vomiting, sweating)?  No  3. Is your CP continuous or coming and going?   Spasm in left breast  4. Have you taken Nitroglycerin?   No  5. How long have you been experiencing CP?    Patient stated this started around 12:30 pm  6. If NO CP at time of call then end call with telling Pt to call back or call 911 if Chest pain returns prior to return call from triage team.   Patient stated he had an episode of "tightness in his chest" which lasted about 3 minutes.  Patient stated EMS did come out to his house to check him out.

## 2023-05-04 NOTE — Telephone Encounter (Signed)
Call to patient , goes straight to VM.  LM to call office

## 2023-05-04 NOTE — Telephone Encounter (Signed)
Call to patient and goes straight to VM. LM to call office Call to wife and she states he had episodes.  She states he does not go to the hospital.  He has gone to pick up some "fish" so he is feeling better.   She states he had Pain to Left side that "hurt and was not going away".  Wife called 911 and had them go assess him.  He was checked and no "rise for concern"  EKG done with no issues.  Patient was feeling better at that time so nothing further was needed.  Patient appt for October, did move it up to next Wednesday.  She will keep both until speaking to husband and will call back to cancel which one is not needed.  ED precautions given and she states understanding.

## 2023-05-09 ENCOUNTER — Encounter: Payer: Self-pay | Admitting: Cardiology

## 2023-05-09 ENCOUNTER — Ambulatory Visit: Payer: Medicare Other | Attending: Cardiology | Admitting: Cardiology

## 2023-05-09 VITALS — BP 98/80 | HR 89 | Ht 72.0 in | Wt 179.2 lb

## 2023-05-09 DIAGNOSIS — I10 Essential (primary) hypertension: Secondary | ICD-10-CM

## 2023-05-09 DIAGNOSIS — Z01810 Encounter for preprocedural cardiovascular examination: Secondary | ICD-10-CM | POA: Insufficient documentation

## 2023-05-09 DIAGNOSIS — Z9889 Other specified postprocedural states: Secondary | ICD-10-CM | POA: Insufficient documentation

## 2023-05-09 DIAGNOSIS — I341 Nonrheumatic mitral (valve) prolapse: Secondary | ICD-10-CM | POA: Diagnosis not present

## 2023-05-09 DIAGNOSIS — I2510 Atherosclerotic heart disease of native coronary artery without angina pectoris: Secondary | ICD-10-CM | POA: Diagnosis present

## 2023-05-09 DIAGNOSIS — R0789 Other chest pain: Secondary | ICD-10-CM | POA: Diagnosis present

## 2023-05-09 DIAGNOSIS — E785 Hyperlipidemia, unspecified: Secondary | ICD-10-CM | POA: Insufficient documentation

## 2023-05-09 DIAGNOSIS — I471 Supraventricular tachycardia, unspecified: Secondary | ICD-10-CM | POA: Insufficient documentation

## 2023-05-09 DIAGNOSIS — Z9861 Coronary angioplasty status: Secondary | ICD-10-CM | POA: Insufficient documentation

## 2023-05-09 MED ORDER — ATORVASTATIN CALCIUM 10 MG PO TABS: 10.0000 mg | ORAL_TABLET | Freq: Every day | ORAL | 3 refills | Status: DC

## 2023-05-09 NOTE — Patient Instructions (Signed)
Medication Instructions:   No changes   *If you need a refill on your cardiac medications before your next appointment, please call your pharmacy*   Lab Work:   Not needed    Testing/Procedures:  Not needed  Follow-Up: At [facility], you and your health needs are our priority.  As part of our continuing mission to provide you with exceptional heart care, we have created designated Provider Care Teams.  These Care Teams include your primary Cardiologist (physician) and Advanced Practice Providers (APPs -  Physician Assistants and Nurse Practitioners) who all work together to provide you with the care you need, when you need it.  We recommend signing up for the patient portal called "MyChart".  Sign up information is provided on this After Visit Summary.  MyChart is used to connect with patients for Virtual Visits (Telemedicine).  Patients are able to view lab/test results, encounter notes, upcoming appointments, etc.  Non-urgent messages can be sent to your provider as well.   To learn more about what you can do with MyChart, go to [email or web address].    Your next appointment:   12 month(s)  The format for your next appointment:   In Person  Provider:   Bryan Lemma, MD    Other Instructions    Cardiac clearance ( low risk) for any upcoming surgeries , no further testing needed

## 2023-05-09 NOTE — Progress Notes (Signed)
Cardiology Office Note:  .   Date:  05/11/2023  ID:  Alan [name], DOB 07/17/1956, MRN [id number] PCP: Alan Gowda.August Saucer, MD  [facility] Providers Cardiologist:  Bryan Lemma, MD     Chief Complaint  Patient presents with   Follow-up    Delayed annual follow-up.  Now 18 months.  Stable from cardiac standpoint.   Coronary Artery Disease    No angina   Cardiac Valve Problem    No issues since surgery.    Patient Profile: .     Alan [name] is a  67 y.o. male  with a PMH notable for CAD anterior STEMI in 2004 with LAD PCI), history of MVP (s/p MVR August 2015), history of SVT ablation, HLD (not taking medications), HTN (controlled on no medications), and history of RLL Ad[place] CA who presents here for 21-month follow-up at the request of Alan [name], L.August Saucer, MD.  Ant STEMI 2004: proximal LAD occlusion treated with 2 overlapping 3.5 x 1.8 mm Cypher DES stents. Reluctant to take medications, especially statins. 04/08/2014: MINIMALLY INVASIVE MITRAL VALVE REPAIR (MVR);  Surgeon: Purcell Nails, MD;   Preop cath: Widely patent LAD stents.  40% distal LAD, 30% LCx, ~50% bifurcation PL- PDA TTE 09/30/2021:: Stable findings.  EF 50 to 55%.  No RWMA.  Mitral valve stable.  No significant MR.  Normal aortic valve.  Mild aortic dilation, but probably normal for age. 10/17/2019-: Comprehensive EPS involving coronary sinus pacing, mapping of SVT and RFA ablation of SVT -> with no inducible arrhythmia following ablation August 2020: Diagnosis of lung adenocarcinoma -> metastatic non-small cell lung CA (initially stage IIIb (T2b, N3, M0) of RLL and right hilar mass, along with ipsilateral and contralateral mediastinal LN. =>  PET scan showed bony mets in March 2023 => palliative radiotherapy to the right iliac bone as well as left humerus (May 2023)  Alan [name] was last seen on October 10, 2021 for annual follow-up and he is doing well.  Had chronic back pain that was limiting his  activity levels.  He he was not taking his atorvastatin daily-caring on the theme of him really not liking to take any medications.  When he hurt his back in October 2022 he basely stopped taking statin.  Due to gentleman's cancer diagnosis but because of his back pain have not yet got back playing golf.  Staying active, but troubled by back pain.     Subjective   INTERVAL HPI Alan [name] presents here today for a very delayed follow-up visit.  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  There is plan to potentially scope out the the mass and feel little spot with plaster which would hopefully alleviate his pain. Mostly because of this significant pain that is also leading to pretty significant radicular pain, he is not very active.  Not playing golf but he is trying to walk.  He is actually able to get on his stationary bicycle and ride for 45 minutes 3 days a week and that actually is able to be done without significant symptoms.  Thankfully, he remains stable Dr. Nanetta Batty standpoint.  He is not having any chest pain pressure or dyspnea with rest or exertion.  With what he is able to do including 45  minutes of riding his bicycle, he has not had any chest pain.  No PND, orthopnea edema. He actually did go back on the atorvastatin last time and unfortunately ran out of it so we will refill it for him.  He continues to take that his co-Q10 but he is not taking any blood pressure medications and does not take daily aspirin mostly because he takes relatively frequent NSAIDs. Cardiovascular ROS: no chest pain or dyspnea on exertion negative for - edema, irregular heartbeat, orthopnea, palpitations, paroxysmal nocturnal dyspnea, rapid heart rate, shortness of breath, or  lightheadedness, dizziness or wooziness, syncope/near syncope or TIA/amaurosis fugax, claudication  ROS:  Review of Systems - Negative except significant back and hip pain related to his prior injury but also limit the bony mets areas.  Has pretty significant radicular pain as well.     Objective   Studies Reviewed: Marland Kitchen   EKG Interpretation Date/Time:  Wednesday May 09 2023 08:47:54 EDT Ventricular Rate:  85 PR Interval:  176 QRS Duration:  84 QT Interval:  400 QTC Calculation: 476 R Axis:   46  Text Interpretation: Normal sinus rhythm Normal ECG When compared with ECG of 25-Sep-2019 19:02, No significant change since last tracing Confirmed by Bryan Lemma ([id number]) on 05/11/2023 2:08:40 AM    ECHO 09/28/2021: Low normal EF 50 to 55%.  No RWMA.  Indeterminate DFxn.  MV functionally well.  No significant MR.  Normal AoV.  Normal RVP and RAP.  Labs from 12/06/2022: TC 165, HDL 52, HDL 52, LDL 97. Labs 12/21/2022: Hgb 14.4, Cr 0.77.  K+ 4.0.  This  Risk Assessment/Calculations:         Mr. Nistler perioperative risk of a major cardiac event is 0.4% according to the Revised Cardiac Risk Index (RCRI).  Therefore, he is at low risk for perioperative complications.   His functional capacity is excellent at 7.93 METs according to the Duke Activity Status Index (DASI). Recommendations: According to ACC/AHA guidelines, no further cardiovascular testing needed.  The patient may proceed to surgery at acceptable risk.   => Has history of mitral valve repair, but has been stable follow-up echo. Antiplatelet and/or Anticoagulation Recommendations: Aspirin can be held for 5 days prior to his surgery.  Please resume Aspirin post operatively when it is felt to be safe from a bleeding standpoint.  Not on standing dose of aspirin.         Physical Exam:   VS:  BP 98/80 (BP Location: Left Arm, Patient Position: Sitting, Cuff Size: Normal)   Pulse 89   Ht 6' (1.829 m)   Wt 179 lb 3.2 oz (81.3  kg)   SpO2 99%   BMI 24.30 kg/m    Wt Readings from Last 3 Encounters:  05/09/23 179 lb 3.2 oz (81.3 kg)  04/05/23 180 lb (81.6 kg)  12/27/22 177 lb 4.8 oz (80.4 kg)    GEN: Well nourished, well developed in no acute distress; relatively healthy appearing but does appear to be in pretty significant discomfort from his back. NECK: No JVD; No carotid bruits CARDIAC: Normal S1, S2; RRR, 1/6 soft HSM at base but otherwise no rubs or gallops noted.  RESPIRATORY:  Clear to auscultation without rales, wheezing or rhonchi ; nonlabored, good air movement. ABDOMEN: Soft, non-tender, non-distended EXTREMITIES:  No edema; No deformity     ASSESSMENT AND PLAN: .    Problem List Items Addressed This Visit       Cardiology Problems   CAD S/P percutaneous coronary angioplasty -  DES PCI to occluded LAD during anterior STEMI - Primary (Chronic)    Very distant the PCI of the LAD in setting of anterior MI.  Stent was in 2015 for preop MVR.  With borderline pressures, he is not on beta-blocker or ACE inhibitor or ARB. Not taking his statin have a new we will run out of his refill.=> Will refill his dose.  He has not been taking more than 10 mg nor will he be interested in taking of the medications at this point. Also not taking daily aspirin as he uses NSAIDs routinely.      Relevant Medications   atorvastatin (LIPITOR) 10 MG tablet   Other Relevant Orders   EKG 12-Lead (Completed)   Dyslipidemia, goal LDL below 70 (Chronic)    LDL back in April was 97.  Not really at goal.  But he was only able to tolerate 10 mg atorvastatin and barely would tolerate that.  He did not want to go back to [facility] to discuss medications.  Was not interested in injections.  Having had all of his cancer treatments, he is tired of having injections and ongoing treatments.  Wants to minimize therapy.  He understands the risks of not treating his lipids.  Refill atorvastatin      Relevant Medications   atorvastatin  (LIPITOR) 10 MG tablet   Other Relevant Orders   EKG 12-Lead (Completed)   History of Severe Mitral valve prolapse - severe prolapse of the posterior (P2) leaflet with severe MR (Chronic)    Status post MVR.  Most recent echo in February 2023 this showed stable valve.  Can reassess in 2025 or 2026 depending on symptoms.      Relevant Medications   atorvastatin (LIPITOR) 10 MG tablet   Other Relevant Orders   EKG 12-Lead (Completed)   SVT (supraventricular tachycardia) (Chronic)    History of SVT ablation.  No further episodes.  Promus      Relevant Medications   atorvastatin (LIPITOR) 10 MG tablet     Other   Preop cardiovascular exam    Depending on what his MRI looks like, he may be needing to have of somewhat orthopedic surgery to treat the bony mets site to help his pain.  The very fact that he is able to ride his bicycle for 45 minutes without having angina or  or significant dyspnea, would indicate that he does not need any further assessment.    05/11/23 0235  Revised Cardiac Risk Index (RCRI)  High Risk Surgery?  0  History of Ischemic Heart Disease? 0 (History of MI with revascularization and normal function.)  History of Congestive Heart Failure? 0  History of Cerebrovascular Disease? 0  Pre-Op Treatment w/ Insulin? 0  Pre-Operative Creatinine > 2 mg/dL? 0  Total Points 0  Perioperative Risk of Major Cardiac Event is (%) 0.4    Mr. Ohmart perioperative risk of a major cardiac event is 0.4% according to the Revised Cardiac Risk Index (RCRI).  Therefore, he is at low risk for perioperative complications.   His functional capacity is excellent at 7.93 METs according to the Duke Activity Status Index (DASI). Recommendations: According to ACC/AHA guidelines, no further cardiovascular testing needed.  The patient may proceed to surgery at acceptable risk.   => Has history of mitral valve repair, but has been stable follow-up echo. Antiplatelet and/or Anticoagulation  Recommendations: Aspirin can be held for 5 days prior to his surgery.  Please resume Aspirin post operatively when it is felt  to be safe from a bleeding standpoint.  Not on standing dose of aspirin.      S/P minimally invasive mitral valve repair (Chronic)    9 years out from his valve surgery.  Last echo was in February 2023.  Would be due for follow-up echo probably in 2026 if stable.  Discussed the need for SBE prophylaxis.      Relevant Orders   EKG 12-Lead (Completed)   Other Visit Diagnoses     Left chest pressure       Relevant Orders   EKG 12-Lead (Completed)               Dispo: Return in about 1 year (around 05/08/2024).  Total time spent: 21 min spent with patient + 16 min spent charting = 37 min      Signed, Marykay Lex, MD, MS Bryan Lemma, M.D., M.S. Interventional Cardiologist  [facility]  Pager # [phone] Phone # [phone] [address] [place], [place] [address]

## 2023-05-11 ENCOUNTER — Encounter: Payer: Self-pay | Admitting: Cardiology

## 2023-05-11 NOTE — Assessment & Plan Note (Signed)
Very distant the PCI of the LAD in setting of anterior MI.  Stent was in 2015 for preop MVR.  With borderline pressures, he is not on beta-blocker or ACE inhibitor or ARB. Not taking his statin have a new we will run out of his refill.=> Will refill his dose.  He has not been taking more than 10 mg nor will he be interested in taking of the medications at this point. Also not taking daily aspirin as he uses NSAIDs routinely.

## 2023-05-11 NOTE — Assessment & Plan Note (Signed)
LDL back in April was 97.  Not really at goal.  But he was only able to tolerate 10 mg atorvastatin and barely would tolerate that.  He did not want to go back to [facility] to discuss medications.  Was not interested in injections.  Having had all of his cancer treatments, he is tired of having injections and ongoing treatments.  Wants to minimize therapy.  He understands the risks of not treating his lipids.  Refill atorvastatin

## 2023-05-11 NOTE — Assessment & Plan Note (Addendum)
Depending on what his MRI looks like, he may be needing to have of somewhat orthopedic surgery to treat the bony mets site to help his pain.  The very fact that he is able to ride his bicycle for 45 minutes without having angina or  or significant dyspnea, would indicate that he does not need any further assessment.    05/11/23 0235  Revised Cardiac Risk Index (RCRI)  High Risk Surgery?  0  History of Ischemic Heart Disease? 0 (History of MI with revascularization and normal function.)  History of Congestive Heart Failure? 0  History of Cerebrovascular Disease? 0  Pre-Op Treatment w/ Insulin? 0  Pre-Operative Creatinine > 2 mg/dL? 0  Total Points 0  Perioperative Risk of Major Cardiac Event is (%) 0.4    Alan [name] perioperative risk of a major cardiac event is 0.4% according to the Revised Cardiac Risk Index (RCRI).  Therefore, he is at low risk for perioperative complications.   His functional capacity is excellent at 7.93 METs according to the Duke Activity Status Index (DASI). Recommendations: According to ACC/AHA guidelines, no further cardiovascular testing needed.  The patient may proceed to surgery at acceptable risk.   => Has history of mitral valve repair, but has been stable follow-up echo. Antiplatelet and/or Anticoagulation Recommendations: Aspirin can be held for 5 days prior to his surgery.  Please resume Aspirin post operatively when it is felt to be safe from a bleeding standpoint.  Not on standing dose of aspirin.

## 2023-05-11 NOTE — Assessment & Plan Note (Signed)
Status post MVR.  Most recent echo in February 2023 this showed stable valve.  Can reassess in 2025 or 2026 depending on symptoms.

## 2023-05-11 NOTE — Assessment & Plan Note (Addendum)
9 years out from his valve surgery.  Last echo was in February 2023.  Would be due for follow-up echo probably in 2026 if stable.  Discussed the need for SBE prophylaxis.

## 2023-05-11 NOTE — Assessment & Plan Note (Signed)
History of SVT ablation.  No further episodes.  Promus

## 2023-05-11 NOTE — Progress Notes (Signed)
   05/11/23 0235  Revised Cardiac Risk Index (RCRI)  High Risk Surgery?  0  History of Ischemic Heart Disease? 0 (History of MI with revascularization and normal function.)  History of Congestive Heart Failure? 0  History of Cerebrovascular Disease? 0  Pre-Op Treatment w/ Insulin? 0  Pre-Operative Creatinine > 2 mg/dL? 0  Total Points 0  Perioperative Risk of Major Cardiac Event is (%) 0.4

## 2023-05-28 ENCOUNTER — Ambulatory Visit: Payer: Federal, State, Local not specified - PPO | Admitting: Cardiology

## 2023-06-13 ENCOUNTER — Other Ambulatory Visit: Payer: Self-pay | Admitting: Family Medicine

## 2023-06-13 DIAGNOSIS — R945 Abnormal results of liver function studies: Secondary | ICD-10-CM

## 2023-06-19 ENCOUNTER — Ambulatory Visit
Admission: RE | Admit: 2023-06-19 | Discharge: 2023-06-19 | Disposition: A | Discharge status: Home / Self Care | Payer: Medicare Other | Source: Ambulatory Visit | Attending: Family Medicine | Admitting: Family Medicine

## 2023-06-19 DIAGNOSIS — R945 Abnormal results of liver function studies: Secondary | ICD-10-CM

## 2023-06-25 ENCOUNTER — Ambulatory Visit (HOSPITAL_COMMUNITY)
Admission: RE | Admit: 2023-06-25 | Discharge: 2023-06-25 | Disposition: A | Discharge status: Home / Self Care | Payer: Medicare Other | Source: Ambulatory Visit | Attending: Internal Medicine | Admitting: Internal Medicine

## 2023-06-25 ENCOUNTER — Inpatient Hospital Stay: Payer: Medicare Other | Attending: Internal Medicine

## 2023-06-25 ENCOUNTER — Encounter (HOSPITAL_COMMUNITY): Payer: Self-pay

## 2023-06-25 DIAGNOSIS — C7951 Secondary malignant neoplasm of bone: Secondary | ICD-10-CM | POA: Insufficient documentation

## 2023-06-25 DIAGNOSIS — C3490 Malignant neoplasm of unspecified part of unspecified bronchus or lung: Secondary | ICD-10-CM | POA: Diagnosis present

## 2023-06-25 DIAGNOSIS — Z85118 Personal history of other malignant neoplasm of bronchus and lung: Secondary | ICD-10-CM | POA: Insufficient documentation

## 2023-06-25 LAB — CMP (CANCER CENTER ONLY)
ALT: 44 U/L (ref 0–44)
AST: 38 U/L (ref 15–41)
Albumin: 4.4 g/dL (ref 3.5–5.0)
Alkaline Phosphatase: 78 U/L (ref 38–126)
Anion gap: 5 (ref 5–15)
BUN: 17 mg/dL (ref 8–23)
CO2: 29 mmol/L (ref 22–32)
Calcium: 9.6 mg/dL (ref 8.9–10.3)
Chloride: 108 mmol/L (ref 98–111)
Creatinine: 0.69 mg/dL (ref 0.61–1.24)
GFR, Estimated: >60 mL/min (ref >60)
Glucose, Bld: 90 mg/dL (ref 70–99)
Potassium: 4.1 mmol/L (ref 3.5–5.1)
Sodium: 142 mmol/L (ref 135–145)
Total Bilirubin: 1.1 mg/dL (ref <1.2)
Total Protein: 6.6 g/dL (ref 6.5–8.1)

## 2023-06-25 LAB — CBC WITH DIFFERENTIAL (CANCER CENTER ONLY)
Abs Immature Granulocytes: 0.01 10*3/uL (ref 0.00–0.07)
Basophils Absolute: 0 10*3/uL (ref 0.0–0.1)
Basophils Relative: 1 %
Eosinophils Absolute: 0.2 10*3/uL (ref 0.0–0.5)
Eosinophils Relative: 4 %
HCT: 40 % (ref 39.0–52.0)
Hemoglobin: 13.4 g/dL (ref 13.0–17.0)
Immature Granulocytes: 0 %
Lymphocytes Relative: 16 %
Lymphs Abs: 0.6 10*3/uL — ABNORMAL LOW (ref 0.7–4.0)
MCH: 32.4 pg (ref 26.0–34.0)
MCHC: 33.5 g/dL (ref 30.0–36.0)
MCV: 96.9 fL (ref 80.0–100.0)
Monocytes Absolute: 0.4 10*3/uL (ref 0.1–1.0)
Monocytes Relative: 10 %
Neutro Abs: 2.7 10*3/uL (ref 1.7–7.7)
Neutrophils Relative %: 69 %
Platelet Count: 171 10*3/uL (ref 150–400)
RBC: 4.13 MIL/uL — ABNORMAL LOW (ref 4.22–5.81)
RDW: 12.6 % (ref 11.5–15.5)
WBC Count: 4 10*3/uL (ref 4.0–10.5)
nRBC: 0 % (ref 0.0–0.2)

## 2023-06-25 MED ORDER — IOHEXOL 300 MG/ML  SOLN
100.0000 mL | Freq: Once | INTRAMUSCULAR | Status: AC | PRN
  Administered 2023-06-25: 100 mL via INTRAVENOUS

## 2023-06-28 ENCOUNTER — Inpatient Hospital Stay: Payer: Medicare Other | Admitting: Internal Medicine

## 2023-06-28 VITALS — BP 119/81 | HR 91 | Temp 98.0°F | Resp 17 | Ht 72.0 in | Wt 178.0 lb

## 2023-06-28 DIAGNOSIS — C7951 Secondary malignant neoplasm of bone: Secondary | ICD-10-CM | POA: Diagnosis present

## 2023-06-28 DIAGNOSIS — Z85118 Personal history of other malignant neoplasm of bronchus and lung: Secondary | ICD-10-CM | POA: Diagnosis not present

## 2023-06-28 DIAGNOSIS — C3490 Malignant neoplasm of unspecified part of unspecified bronchus or lung: Secondary | ICD-10-CM | POA: Diagnosis not present

## 2023-06-28 NOTE — Progress Notes (Signed)
[facility] Telephone:([phone]) [phone]   Fax:([phone]  OFFICE PROGRESS NOTE  Alan [name], Alan Saucer, Alan [name] (Inactive) [address] [place] [address]  DIAGNOSIS: Metastatic non-small cell lung cancer initially diagnosed as stage IIIb (T2b, N3, M0) non-small cell lung cancer, adenocarcinoma presented with right lower lobe lung nodule in addition to right hilar mass/node as well as ipsilateral and contralateral mediastinal lymphadenopathy diagnosed in October 2020.  The patient has evidence of bone metastases on a PET scan in March 2023.  Molecular studies by [employer] showed no actionable mutations.  PRIOR THERAPY:  1) Concurrent chemoradiation with weekly carboplatin for AUC of 2 and paclitaxel 45 mg/M2.  Status post 7 cycles.  Last dose was given July 21, 2019. 2) Consolidation treatment with immunotherapy with Imfinzi 1500 mg.  Status post 13 cycles.  Last dose was given August 19, 2020. 3) palliative radiotherapy to the right iliac bone as well as the left humerus under the care of Dr. Roselind Messier completed on Dec 16, 2021  CURRENT THERAPY: Observation.  INTERVAL HISTORY: Alan [name] 67 y.o. male returns to the clinic today for follow-up visit.Discussed the use of AI scribe software for clinical note transcription with the patient, who gave verbal consent to proceed.  History of Present Illness   Alan [name], a 67 year old patient, was diagnosed with stage 3B non-small cell lung cancer adenocarcinoma in October 2020. Following the diagnosis, he underwent a course of chemotherapy and radiation, followed by a year-long treatment with immunotherapy using Imfinzi. The patient tolerated the treatment well. However, in March 2023, bone metastases were discovered, for which palliative radiation was administered.  Over the past two years, the patient has been experiencing persistent back pain, which he rates as an 8 or 9 on the pain scale. Despite  undergoing various treatments such as ablations and cortisone shots, the pain has not subsided. The patient has been exploring surgical options for a lesion attached to his spine, which has been identified as inoperable at some point. He has been in contact with a group of surgical specialists who have suggested that the area must be cancer-free for any surgical intervention to be effective.  In an attempt to manage his pain, the patient has been taking a supplement called Infla 650, which he reports has reduced his pain level from a 9 to a 4. He has also been maintaining physical activity by attending spin classes three times a week.  The patient's most recent CT scan was conducted in the week of the current consultation. He has expressed curiosity about any new hot spots or changes in his condition. He has also reported a slight raspiness in his voice, which he attributes to possible scar tissue. He denies any chest pain, breathing issues, or cough.        MEDICAL HISTORY: Past Medical History:  Diagnosis Date   Adenocarcinoma of right lung, stage 3 (HCC) dx'd 03/2019   CAD S/P percutaneous coronary angioplasty 2004   (Ant STEMI) - Prox LAD 100% => 2 overlapping 3.5 x 1.8 mm Cypher DES stents.;  Patent as of August 2015   Dyslipidemia, goal LDL below 70    Essential hypertension    GERD (gastroesophageal reflux disease)    History of Mitral valve prolapse    Moderate - with moderate MR, noted February t 2013   History of radiation therapy    Right Pelvis & left humerus- 12/05/21-12/16/21- Dr. Antony Blackbird   History of Severe  mitral regurgitation by prior echocardiogram 02/06/2014   TEE: Severe mitral regurgitation with a flail P2 segment and ruptured; Normal LV size & function, dilated LA.   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    ALLERGIES:  is allergic to a-cillin [ampicillin], amoxicillin, other, penicillins, sulfa antibiotics, and crestor [rosuvastatin].  MEDICATIONS:  Current Outpatient Medications  Medication Sig Dispense Refill   Ascorbic Acid (VITAMIN C) 1000 MG tablet Take 2,000 mg by mouth daily.      atorvastatin (LIPITOR) 10 MG tablet Take 1 tablet (10 mg total) by mouth daily. 90 tablet 3   Coenzyme Q10 (CO Q 10) 100 MG CAPS Take 100 mg by mouth daily.      Hypromellose (HYDROXYPROPYL METHYLCELLULOSE) POWD      latanoprost (XALATAN) 0.005 % ophthalmic solution Place 1 drop into both eyes every morning.     magnesium 30 MG tablet Take 1 tablet by mouth at bedtime.     Melatonin 10 MG TABS Take 10 mg by mouth at bedtime.     naltrexone (DEPADE) 50 MG tablet Take by mouth daily. Taking 1 mg daily     OVER THE COUNTER MEDICATION Take 4 tablets by mouth in the morning, at noon, and at bedtime. " Relief Factor " from online.     Specialty Vitamins Products (PROSTATE PO) Take 1 capsule by mouth daily. PROSTATE FORMULA     traMADol (ULTRAM) 50 MG tablet Take 50 mg by mouth 3 (three) times daily as needed.     vitamin B-12 (CYANOCOBALAMIN) 1000 MCG tablet Take 1,000 mcg by mouth daily.     Vitamin D-Vitamin K (D3 + K2 DOTS PO) Take 1 tablet by mouth daily.     No current facility-administered medications for this visit.    SURGICAL HISTORY:  Past Surgical History:  Procedure Laterality Date   ANTERIOR CRUCIATE LIGAMENT REPAIR Left 1993    COLONOSCOPY     INTRAOPERATIVE TRANSESOPHAGEAL ECHOCARDIOGRAM N/A 04/08/2014   Procedure: INTRAOPERATIVE TRANSESOPHAGEAL ECHOCARDIOGRAM;  Surgeon: Purcell Nails, Alan [name];  Location: [facility] OR;  Service: Open Heart Surgery;  Laterality: N/A;   IR RADIOLOGIST EVAL & MGMT  10/04/2022   IR RADIOLOGIST EVAL & MGMT  11/01/2022   IR RADIOLOGIST EVAL & MGMT  12/06/2022   IR RADIOLOGIST EVAL & MGMT  01/23/2023   LEFT AND RIGHT HEART CATHETERIZATION WITH CORONARY ANGIOGRAM N/A 03/05/2014   Procedure: LEFT AND RIGHT HEART CATHETERIZATION WITH CORONARY ANGIOGRAM;  Surgeon: Marykay Lex, Alan [name];  Location: [facility] CATH LAB;  Service: Cardiovascular; (pre-op) Widely patent LAD stents, 40% distal LAD, 30% Cx, ~50% PL-PDA bifurcation lesion    LEFT HEART CATH AND CORONARY ANGIOGRAPHY  2004   In setting of anterior STEMI, found to have 100% % proximal LAD (2 DES Cypher stents)   LEFT HEART CATH AND CORONARY ANGIOGRAPHY  2007   Widely patent LAD stents, 40% distal LAD, 30% Cx, ~  50% PL-PDA bifurcation lesion    LEFT HEART CATHETERIZATION WITH CORONARY ANGIOGRAM N/A 09/15/2011   Procedure: LEFT HEART CATHETERIZATION WITH CORONARY ANGIOGRAM;  Surgeon: Runell Gess, Alan [name];  Location: [facility] CATH LAB;  Service: Cardiovascular;  Laterality: N/A;   MITRAL VALVE REPAIR Right 04/08/2014   Procedure: MINIMALLY INVASIVE MITRAL VALVE REPAIR (MVR);  Surgeon: Purcell Nails, Alan [name];  Location: [facility] OR;  Service: Open Heart Surgery;  Laterality: Right;   NM MYOVIEW LTD  12/2009   Walk 9 minutes, and 10 METs, diaphragmatic attenuation but no ischemia or infarction.   SVT ABLATION N/A 10/17/2019   Procedure: SVT ABLATION;  Surgeon: Regan Lemming, Alan [name];  Location: [facility] INVASIVE CV LAB;  Service: Cardiovascular;  Laterality: N/A;   TEE WITHOUT CARDIOVERSION N/A 02/06/2014   Procedure: TRANSESOPHAGEAL ECHOCARDIOGRAM (TEE);  Surgeon: Chrystie Nose, Alan [name];  Normal LV Size U& function - EF 55-60%, no regional WMA.  MV P2 Leaflet is flail with ruptured  chord with severe prolapse, anterior leaflet intact.  Severe, eccentric anterior directed MR with dilated LA.   TRANSTHORACIC ECHOCARDIOGRAM  07/18/2018   Normal LV size and function.  EF 50-60 %.  Unable to assess diastolic function.  Mitral valve sewing ring in place.  Mild stenosis with a gradient of 6 mmHg noted. -   TRANSTHORACIC ECHOCARDIOGRAM  09/30/2021   Stable findings.  EF 50 to 55%.  No RWMA.  Mitral valve stable.  No significant MR.  Normal aortic valve.  Mild aortic dilation, but probably normal for age.   VIDEO BRONCHOSCOPY WITH ENDOBRONCHIAL ULTRASOUND N/A 05/16/2019   Procedure: VIDEO BRONCHOSCOPY WITH ENDOBRONCHIAL ULTRASOUND;  Surgeon: Chilton Greathouse, Alan [name];  Location: [facility] OR;  Service: Pulmonary;  Laterality: N/A;    REVIEW OF SYSTEMS:  Constitutional: negative Eyes: negative Ears, nose, mouth, throat, and face: negative Respiratory: positive for cough Cardiovascular: negative Gastrointestinal: negative Genitourinary:negative Integument/breast: negative Hematologic/lymphatic: negative Musculoskeletal:positive for back pain Neurological: negative Behavioral/Psych: negative Endocrine: negative Allergic/Immunologic: negative   PHYSICAL EXAMINATION: General appearance: alert, cooperative, and no distress Head: Normocephalic, without obvious abnormality, atraumatic Neck: no adenopathy, no JVD, supple, symmetrical, trachea midline, and thyroid not enlarged, symmetric, no tenderness/mass/nodules Lymph nodes: Cervical, supraclavicular, and axillary nodes normal. Resp: clear to auscultation bilaterally Back: symmetric, no curvature. ROM normal. No CVA tenderness. Cardio: regular rate and rhythm, S1, S2 normal, no murmur, click, rub or gallop GI: soft, non-tender; bowel sounds normal; no masses,  no organomegaly Extremities: extremities normal, atraumatic, no cyanosis or edema Neurologic: Alert and oriented X 3, normal strength and tone. Normal symmetric reflexes. Normal  coordination and gait  ECOG PERFORMANCE STATUS: 1 - Symptomatic but completely ambulatory  Blood pressure 119/81, pulse 91, temperature 98 F (36.7 C), temperature source Temporal, resp. rate 17, height 6' (1.829 m), weight 178 lb (80.7 kg), SpO2 100%.  LABORATORY DATA: Lab Results  Component Value Date   WBC 4.0 06/25/2023   HGB 13.4 06/25/2023   HCT 40.0 06/25/2023   MCV 96.9 06/25/2023   PLT 171 06/25/2023      Chemistry      Component Value Date/Time   NA 142 06/25/2023 0949   NA 141 02/25/2020 0915   K 4.1 06/25/2023 0949   CL 108 06/25/2023 0949   CO2 29 06/25/2023 0949   BUN 17 06/25/2023 0949   BUN 14 02/25/2020 0915   CREATININE 0.69 06/25/2023 0949   CREATININE 0.80 02/27/2014 1152      Component Value Date/Time   CALCIUM 9.6 06/25/2023 0949   ALKPHOS  78 06/25/2023 0949   AST 38 06/25/2023 0949   ALT 44 06/25/2023 0949   BILITOT 1.1 06/25/2023 0949       RADIOGRAPHIC STUDIES: US Abdomen Limited RUQ (LIVER/GB)  Result Date: 06/19/2023 CLINICAL DATA:  History of lung cancer EXAM: ULTRASOUND ABDOMEN LIMITED RIGHT UPPER QUADRANT COMPARISON:  CT abdomen and pelvis 12/21/2022 FINDINGS: Gallbladder: No gallstones or wall thickening visualized. No sonographic Murphy sign noted by sonographer. Common bile duct: Diameter: 1.8 mm Liver: Increased echogenicity. No focal lesion. Portal vein is patent on color Doppler imaging with normal direction of blood flow towards the liver. Other: None. IMPRESSION: 1. Increased hepatic parenchymal echogenicity suggestive of steatosis. 2. No cholelithiasis or sonographic evidence for acute cholecystitis. Electronically Signed   By: Annia Belt M.D.   On: 06/19/2023 12:45     ASSESSMENT AND PLAN: This is a very pleasant 67 years old white male recently diagnosed with likely metastatic non-small cell lung cancer initially diagnosed as stage IIIb non-small cell lung cancer, adenocarcinoma with no actionable mutation.  He completed a course  of concurrent chemoradiation with weekly carboplatin and paclitaxel status post 7 cycles.   His treatment was complicated with dysphagia, odynophagia secondary to radiation induced esophagitis.  He also has persistent cough and shortness of breath secondary to radiation-induced pneumonitis. He has partial response after this treatment. The patient completed treatment with consolidation immunotherapy with Imfinzi 1500 mg IV every 4 weeks, status post 13 cycles.  Last dose was in January 2022. The patient tolerated his previous treatment fairly well with no concerning adverse effects. The patient had evidence for disease metastasis to the left humerus as well as medial right iliac wing and sacrum in March 2023 that was biopsy-proven to be metastatic non-small cell lung cancer, adenocarcinoma. The patient received palliative radiotherapy to these areas under the care of Dr. Roselind Messier.. The patient is feeling fine today with no concerning complaints except for the persistent back pain. He underwent ablation by orthopedic surgery but it was not helpful. He recently underwent ablation by interventional radiology twice and his pain level has significantly improved.  But this started increasing again.  He tried to reach to his spine surgery group at [facility] in [place] but unfortunately his request and referral was declined. He had repeat CT scan of the chest, abdomen and pelvis performed recently.  I personally and independently reviewed the scan and discussed the result with the patient today. His scan showed no concerning findings for disease recurrence or metastasis.    Stage III B Non-Small Cell Lung Cancer (NSCLC) with Bone Metastases Diagnosed October 2020. Treated with chemotherapy, radiation, and one year of Imfinzi. Bone metastases identified March 2023, treated with palliative radiation. Recent CT: no new spots, no progression, no local recurrence or metastatic adenopathy. Sclerotic skeletal bone  metastasis unchanged. Patient reports stable cancer symptoms. Discussed potential spinal lesion surgery if area remains cancer-free. Risks: potential worsening of pain if unsuccessful. Benefits: potential pain reduction to manageable levels. Patient prefers surgery if pain can be reduced to 4-5, with further reduction via medication. - CT scans every three months - Discuss scan results with surgical specialists for potential intervention if area remains cancer-free  Chronic Back Pain Persistent for two years, previously 8-9 pain level. Ineffective treatments: ablations, cortisone shots. Infla 650 supplement reduced pain to 4. Significant improvement in daily activities. Patient prefers continuing supplement and considering surgery if pain persists and area is cancer-free. - Continue Infla 650 - Consider surgical intervention if area is cancer-free  and pain persists  General Health Maintenance Active, participates in spin classes three times a week, contributing to muscle strength and well-being. - Encourage continued physical activity and exercise regimen  Follow-up - Follow-up visit in three months - CT scan at next visit.   He was advised to call immediately if he has any concerning symptoms in the interval. The patient voices understanding of current disease status and treatment options and is in agreement with the current care plan. All questions were answered. The patient knows to call the clinic with any problems, questions or concerns. We can certainly see the patient much sooner if necessary. The total time spent in the appointment was 30 minutes.  Disclaimer: This note was dictated with voice recognition software. Similar sounding words can inadvertently be transcribed and may not be corrected upon review.

## 2023-06-29 ENCOUNTER — Telehealth: Payer: Self-pay | Admitting: Medical Oncology

## 2023-06-29 NOTE — Telephone Encounter (Signed)
Scalp pain and headaches.-He forgot to tell Dr. Arbutus Ped about his scalp pain and headaches.

## 2023-07-02 ENCOUNTER — Other Ambulatory Visit: Payer: Self-pay | Admitting: Medical Oncology

## 2023-07-02 ENCOUNTER — Telehealth: Payer: Self-pay | Admitting: Medical Oncology

## 2023-07-02 ENCOUNTER — Other Ambulatory Visit: Payer: Self-pay

## 2023-07-02 DIAGNOSIS — C3490 Malignant neoplasm of unspecified part of unspecified bronchus or lung: Secondary | ICD-10-CM

## 2023-07-02 DIAGNOSIS — R519 Headache, unspecified: Secondary | ICD-10-CM

## 2023-07-02 NOTE — Progress Notes (Unsigned)
err

## 2023-07-02 NOTE — Telephone Encounter (Signed)
Since last Monday Terrible scalp pain "to the bone"on the right side of head above his ear- Very painful to touch.  Worse than back pain and it wakes him up at night . He denies rash or radiating pain .   I recommended urgent care and to call his PCP in am .   He requested an appt in [facility] tomorrow.

## 2023-07-03 ENCOUNTER — Encounter: Payer: Self-pay | Admitting: Medical Oncology

## 2023-07-06 ENCOUNTER — Ambulatory Visit (HOSPITAL_COMMUNITY)
Admission: RE | Admit: 2023-07-06 | Discharge: 2023-07-06 | Disposition: A | Discharge status: Home / Self Care | Payer: Medicare Other | Source: Ambulatory Visit | Attending: Internal Medicine | Admitting: Internal Medicine

## 2023-07-06 DIAGNOSIS — R519 Headache, unspecified: Secondary | ICD-10-CM | POA: Insufficient documentation

## 2023-07-06 DIAGNOSIS — C3490 Malignant neoplasm of unspecified part of unspecified bronchus or lung: Secondary | ICD-10-CM | POA: Insufficient documentation

## 2023-07-06 MED ORDER — GADOBUTROL 1 MMOL/ML IV SOLN
8.0000 mL | Freq: Once | INTRAVENOUS | Status: AC | PRN
  Administered 2023-07-06: 8 mL via INTRAVENOUS

## 2023-12-26 ENCOUNTER — Ambulatory Visit (HOSPITAL_COMMUNITY)
Admission: RE | Admit: 2023-12-26 | Discharge: 2023-12-26 | Disposition: A | Discharge status: Home / Self Care | Payer: Medicare Other | Source: Ambulatory Visit | Attending: Internal Medicine | Admitting: Internal Medicine

## 2023-12-26 ENCOUNTER — Inpatient Hospital Stay: Payer: Medicare Other | Attending: Internal Medicine

## 2023-12-26 DIAGNOSIS — C7951 Secondary malignant neoplasm of bone: Secondary | ICD-10-CM | POA: Diagnosis not present

## 2023-12-26 DIAGNOSIS — R634 Abnormal weight loss: Secondary | ICD-10-CM | POA: Diagnosis not present

## 2023-12-26 DIAGNOSIS — C3431 Malignant neoplasm of lower lobe, right bronchus or lung: Secondary | ICD-10-CM | POA: Insufficient documentation

## 2023-12-26 DIAGNOSIS — C3490 Malignant neoplasm of unspecified part of unspecified bronchus or lung: Secondary | ICD-10-CM

## 2023-12-26 DIAGNOSIS — G8929 Other chronic pain: Secondary | ICD-10-CM | POA: Insufficient documentation

## 2023-12-26 LAB — CBC WITH DIFFERENTIAL (CANCER CENTER ONLY)
Abs Immature Granulocytes: 0.01 10*3/uL (ref 0.00–0.07)
Basophils Absolute: 0 10*3/uL (ref 0.0–0.1)
Basophils Relative: 1 %
Eosinophils Absolute: 0.3 10*3/uL (ref 0.0–0.5)
Eosinophils Relative: 5 %
HCT: 38.2 % — ABNORMAL LOW (ref 39.0–52.0)
Hemoglobin: 13.1 g/dL (ref 13.0–17.0)
Immature Granulocytes: 0 %
Lymphocytes Relative: 11 %
Lymphs Abs: 0.6 10*3/uL — ABNORMAL LOW (ref 0.7–4.0)
MCH: 32 pg (ref 26.0–34.0)
MCHC: 34.3 g/dL (ref 30.0–36.0)
MCV: 93.4 fL (ref 80.0–100.0)
Monocytes Absolute: 0.5 10*3/uL (ref 0.1–1.0)
Monocytes Relative: 10 %
Neutro Abs: 4.1 10*3/uL (ref 1.7–7.7)
Neutrophils Relative %: 73 %
Platelet Count: 175 10*3/uL (ref 150–400)
RBC: 4.09 MIL/uL — ABNORMAL LOW (ref 4.22–5.81)
RDW: 11.8 % (ref 11.5–15.5)
WBC Count: 5.5 10*3/uL (ref 4.0–10.5)
nRBC: 0 % (ref 0.0–0.2)

## 2023-12-26 LAB — CMP (CANCER CENTER ONLY)
ALT: 17 U/L (ref 0–44)
AST: 20 U/L (ref 15–41)
Albumin: 4.2 g/dL (ref 3.5–5.0)
Alkaline Phosphatase: 82 U/L (ref 38–126)
Anion gap: 4 — ABNORMAL LOW (ref 5–15)
BUN: 16 mg/dL (ref 8–23)
CO2: 30 mmol/L (ref 22–32)
Calcium: 9.3 mg/dL (ref 8.9–10.3)
Chloride: 106 mmol/L (ref 98–111)
Creatinine: 0.74 mg/dL (ref 0.61–1.24)
GFR, Estimated: >60 mL/min (ref >60)
Glucose, Bld: 99 mg/dL (ref 70–99)
Potassium: 4.1 mmol/L (ref 3.5–5.1)
Sodium: 140 mmol/L (ref 135–145)
Total Bilirubin: 1 mg/dL (ref 0.0–1.2)
Total Protein: 6.5 g/dL (ref 6.5–8.1)

## 2023-12-26 MED ORDER — SODIUM CHLORIDE (PF) 0.9 % IJ SOLN
INTRAMUSCULAR | Status: AC
Start: 2023-12-26 — End: ?
  Filled 2023-12-26: qty 50

## 2023-12-26 MED ORDER — IOHEXOL 300 MG/ML  SOLN
100.0000 mL | Freq: Once | INTRAMUSCULAR | Status: AC | PRN
  Administered 2023-12-26: 100 mL via INTRAVENOUS

## 2024-01-02 ENCOUNTER — Inpatient Hospital Stay (HOSPITAL_BASED_OUTPATIENT_CLINIC_OR_DEPARTMENT_OTHER): Payer: Medicare Other | Admitting: Internal Medicine

## 2024-01-02 VITALS — BP 124/84 | HR 88 | Temp 98.3°F | Resp 16 | Ht 72.0 in | Wt 170.5 lb

## 2024-01-02 DIAGNOSIS — C3490 Malignant neoplasm of unspecified part of unspecified bronchus or lung: Secondary | ICD-10-CM | POA: Diagnosis not present

## 2024-01-02 DIAGNOSIS — C3431 Malignant neoplasm of lower lobe, right bronchus or lung: Secondary | ICD-10-CM | POA: Diagnosis not present

## 2024-01-02 NOTE — Progress Notes (Signed)
 [facility] [phone]   [phone]  OFFICE PROGRESS NOTE  [facility], [facility] [address], [address] [place] [address]  DIAGNOSIS: Metastatic non-small cell lung cancer initially diagnosed as stage IIIb (T2b, N3, M0) non-small cell lung cancer, adenocarcinoma presented with right lower lobe lung nodule in addition to right hilar mass/node as well as ipsilateral and contralateral mediastinal lymphadenopathy diagnosed in October 2020.  The patient has evidence of bone metastases on a PET scan in March 2023.  Molecular studies by [employer] showed no actionable mutations.  PRIOR THERAPY:  1) Concurrent chemoradiation with weekly carboplatin  for AUC of 2 and paclitaxel  45 mg/M2.  Status post 7 cycles.  Last dose was given July 21, 2019. 2) Consolidation treatment with immunotherapy with Imfinzi  1500 mg.  Status post 13 cycles.  Last dose was given August 19, 2020. 3) palliative radiotherapy to the right iliac bone as well as the left humerus under the care of Dr. Eloise Hake completed on Dec 16, 2021  CURRENT THERAPY: Observation.  INTERVAL HISTORY: Alan [name] 68 y.o. male returns to the clinic today for follow-up visit.Discussed the use of AI scribe software for clinical note transcription with the patient, who gave verbal consent to proceed.  History of Present Illness   Alan [name] "Genella Kendall" is a 68 year old male with metastatic non-small cell lung cancer who presents for follow-up evaluation.  He has a history of metastatic non-small cell lung cancer, adenocarcinoma, initially diagnosed as stage 3B in October 2020, with bone metastasis identified in March 2023. He was previously treated with concurrent chemoradiation.  Over the past six months, he has experienced significant fatigue, particularly in the afternoons. Despite this, he has no trouble sleeping and has increased his physical activity by starting a  weightlifting regimen to build muscle mass. He notes a significant weight loss, dropping from 185 pounds to 160 pounds within a week, but has since regained weight to 170 pounds.  He mentions using supplements that reduced his pain from a level of nine to two or one. However, he stopped taking them about two weeks ago to assess if he had healed, and reports no side effects since discontinuation. He experiences morning stiffness, which he attributes to arthritis.  He had a recent CT scan of the chest, abdomen, and pelvis. No new side effects or symptoms apart from morning stiffness.        MEDICAL HISTORY: Past Medical History:  Diagnosis Date   Adenocarcinoma of right lung, stage 3 (HCC) dx'd 03/2019   CAD S/P percutaneous coronary angioplasty 2004   (Ant STEMI) - Prox LAD 100% => 2 overlapping 3.5 x 1.8 mm Cypher DES stents.;  Patent as of August 2015   Dyslipidemia, goal LDL below 70    Essential hypertension    GERD (gastroesophageal reflux disease)    History of Mitral valve prolapse    Moderate - with moderate MR, noted February t 2013   History of radiation therapy    Right Pelvis & left humerus- 12/05/21-12/16/21- Dr. Retta Caster   History of Severe mitral regurgitation by prior echocardiogram 02/06/2014   TEE: Severe mitral regurgitation with a flail P2 segment and ruptured; Normal LV size & function, dilated LA.   Incidental lung nodule, greater than or equal to 8mm 03/16/2014   Ground glass opacity RML noted on CT scan   PONV (postoperative nausea and vomiting)    S/P Minimally Invasive MVR (mitral  valve repair) 04/08/2014   Complex valvuloplasty including quadrangular resection of flail segment of posterior leaflet, sliding leaflet plasty, chordal transfer x1, Gore-tex neocord placement x4 and 34 mm Sorin Memo 3D rechord ring annuloplasty via right mini thoracotomy approach   ST elevation myocardial infarction (STEMI) of anterior wall, subsequent episode of care (HCC) 2004    he had a proximal LAD occlusion treated with 2 overlapping 3.5 x 1.8 mm Cypher DES stents.    ALLERGIES:  is allergic to a-cillin [ampicillin], amoxicillin, other, penicillins, sulfa antibiotics, and crestor  [rosuvastatin ].  MEDICATIONS:  Current Outpatient Medications  Medication Sig Dispense Refill   Ascorbic Acid  (VITAMIN C ) 1000 MG tablet Take 2,000 mg by mouth daily.      atorvastatin  (LIPITOR) 10 MG tablet Take 1 tablet (10 mg total) by mouth daily. 90 tablet 3   Coenzyme Q10 (CO Q 10) 100 MG CAPS Take 100 mg by mouth daily.      Hypromellose (HYDROXYPROPYL METHYLCELLULOSE) POWD      latanoprost (XALATAN) 0.005 % ophthalmic solution Place 1 drop into both eyes every morning.     magnesium  30 MG tablet Take 1 tablet by mouth at bedtime.     Melatonin 10 MG TABS Take 10 mg by mouth at bedtime.     naltrexone (DEPADE) 50 MG tablet Take by mouth daily. Taking 1 mg daily     OVER THE COUNTER MEDICATION Take 4 tablets by mouth in the morning, at noon, and at bedtime. " Relief Factor " from online.     Specialty Vitamins Products (PROSTATE PO) Take 1 capsule by mouth daily. PROSTATE FORMULA     traMADol  (ULTRAM ) 50 MG tablet Take 50 mg by mouth 3 (three) times daily as needed.     vitamin B-12 (CYANOCOBALAMIN) 1000 MCG tablet Take 1,000 mcg by mouth daily.     Vitamin D-Vitamin K (D3 + K2 DOTS PO) Take 1 tablet by mouth daily.     No current facility-administered medications for this visit.    SURGICAL HISTORY:  Past Surgical History:  Procedure Laterality Date   ANTERIOR CRUCIATE LIGAMENT REPAIR Left 1993   COLONOSCOPY     INTRAOPERATIVE TRANSESOPHAGEAL ECHOCARDIOGRAM N/A 04/08/2014   Procedure: INTRAOPERATIVE TRANSESOPHAGEAL ECHOCARDIOGRAM;  Surgeon: Gardenia Jump, MD;  Location: [facility] OR;  Service: Open Heart Surgery;  Laterality: N/A;   IR RADIOLOGIST EVAL & MGMT  10/04/2022   IR RADIOLOGIST EVAL & MGMT  11/01/2022   IR RADIOLOGIST EVAL & MGMT  12/06/2022   IR RADIOLOGIST EVAL &  MGMT  01/23/2023   LEFT AND RIGHT HEART CATHETERIZATION WITH CORONARY ANGIOGRAM N/A 03/05/2014   Procedure: LEFT AND RIGHT HEART CATHETERIZATION WITH CORONARY ANGIOGRAM;  Surgeon: Arleen Lacer, MD;  Location: [facility] CATH LAB;  Service: Cardiovascular; (pre-op) Widely patent LAD stents, 40% distal LAD, 30% Cx, ~50% PL-PDA bifurcation lesion    LEFT HEART CATH AND CORONARY ANGIOGRAPHY  2004   In setting of anterior STEMI, found to have 100% % proximal LAD (2 DES Cypher stents)   LEFT HEART CATH AND CORONARY ANGIOGRAPHY  2007   Widely patent LAD stents, 40% distal LAD, 30% Cx, ~50% PL-PDA bifurcation lesion    LEFT HEART CATHETERIZATION WITH CORONARY ANGIOGRAM N/A 09/15/2011   Procedure: LEFT HEART CATHETERIZATION WITH CORONARY ANGIOGRAM;  Surgeon: Avanell Leigh, MD;  Location: [facility] CATH LAB;  Service: Cardiovascular;  Laterality: N/A;   MITRAL VALVE REPAIR Right 04/08/2014   Procedure: MINIMALLY INVASIVE MITRAL VALVE REPAIR (MVR);  Surgeon: Gardenia Jump, MD;  Location: [facility] OR;  Service: Open Heart Surgery;  Laterality: Right;   NM MYOVIEW  LTD  12/2009   Walk 9 minutes, and 10 METs, diaphragmatic attenuation but no ischemia or infarction.   SVT ABLATION N/A 10/17/2019   Procedure: SVT ABLATION;  Surgeon: Lei Pump, MD;  Location: [facility] INVASIVE CV LAB;  Service: Cardiovascular;  Laterality: N/A;   TEE WITHOUT CARDIOVERSION N/A 02/06/2014   Procedure: TRANSESOPHAGEAL ECHOCARDIOGRAM (TEE);  Surgeon: Hazle Lites, MD;  Normal LV Size U& function - EF 55-60%, no regional WMA.  MV P2 Leaflet is flail with ruptured chord with severe prolapse, anterior leaflet intact.  Severe, eccentric anterior directed MR with dilated LA.   TRANSTHORACIC ECHOCARDIOGRAM  07/18/2018   Normal LV size and function.  EF 50-60 %.  Unable to assess diastolic function.  Mitral valve sewing ring in place.  Mild stenosis with a gradient of 6 mmHg noted. -   TRANSTHORACIC ECHOCARDIOGRAM  09/30/2021   Stable findings.   EF 50 to 55%.  No RWMA.  Mitral valve stable.  No significant MR.  Normal aortic valve.  Mild aortic dilation, but probably normal for age.   VIDEO BRONCHOSCOPY WITH ENDOBRONCHIAL ULTRASOUND N/A 05/16/2019   Procedure: VIDEO BRONCHOSCOPY WITH ENDOBRONCHIAL ULTRASOUND;  Surgeon: Mannam, Praveen, MD;  Location: [facility] OR;  Service: Pulmonary;  Laterality: N/A;    REVIEW OF SYSTEMS:  Constitutional: positive for fatigue Eyes: negative Ears, nose, mouth, throat, and face: negative Respiratory: negative Cardiovascular: negative Gastrointestinal: negative Genitourinary:negative Integument/breast: negative Hematologic/lymphatic: negative Musculoskeletal:positive for arthralgias Neurological: negative Behavioral/Psych: negative Endocrine: negative Allergic/Immunologic: negative   PHYSICAL EXAMINATION: General appearance: alert, cooperative, and no distress Head: Normocephalic, without obvious abnormality, atraumatic Neck: no adenopathy, no JVD, supple, symmetrical, trachea midline, and thyroid  not enlarged, symmetric, no tenderness/mass/nodules Lymph nodes: Cervical, supraclavicular, and axillary nodes normal. Resp: clear to auscultation bilaterally Back: symmetric, no curvature. ROM normal. No CVA tenderness. Cardio: regular rate and rhythm, S1, S2 normal, no murmur, click, rub or gallop GI: soft, non-tender; bowel sounds normal; no masses,  no organomegaly Extremities: extremities normal, atraumatic, no cyanosis or edema Neurologic: Alert and oriented X 3, normal strength and tone. Normal symmetric reflexes. Normal coordination and gait  ECOG PERFORMANCE STATUS: 1 - Symptomatic but completely ambulatory  Blood pressure 124/84, pulse 88, temperature 98.3 F (36.8 C), temperature source Temporal, resp. rate 16, height 6' (1.829 m), weight 170 lb 8 oz (77.3 kg), SpO2 98%.  LABORATORY DATA: Lab Results  Component Value Date   WBC 5.5 12/26/2023   HGB 13.1 12/26/2023   HCT 38.2 (L)  12/26/2023   MCV 93.4 12/26/2023   PLT 175 12/26/2023      Chemistry      Component Value Date/Time   NA 140 12/26/2023 1154   NA 141 02/25/2020 0915   K 4.1 12/26/2023 1154   CL 106 12/26/2023 1154   CO2 30 12/26/2023 1154   BUN 16 12/26/2023 1154   BUN 14 02/25/2020 0915   CREATININE 0.74 12/26/2023 1154   CREATININE 0.80 02/27/2014 1152      Component Value Date/Time   CALCIUM  9.3 12/26/2023 1154   ALKPHOS 82 12/26/2023 1154   AST 20 12/26/2023 1154   ALT 17 12/26/2023 1154   BILITOT 1.0 12/26/2023 1154       RADIOGRAPHIC STUDIES: CT Chest W Contrast Result Date: 12/26/2023 EXAMINATION: CT ABDOMEN PELVIS W CONTRAST (accession [id number] [facility]), CT CHEST W CONTRAST (accession [id number]) CLINICAL INDICATION: Male, 69 years old. Non-small cell lung cancer (NSCLC), staging  TECHNIQUE: Axial CT of the chest, abdomen and pelvis with 100 cc Omnipaque  300 intravenous contrast. Multiplanar reformations provided. Unless otherwise specified, incidental thyroid , adrenal, renal lesions do not require dedicated imaging follow up. Additionally, any mentioned pulmonary nodules do not require dedicated imaging follow-up based on the Fleischner guidelines unless otherwise specified. Coronary calcifications are not identified unless otherwise specified. COMPARISON: 06/25/2023 FINDINGS: The thyroid  appears normal. Thoracic aorta is normal in caliber. Scattered atherosclerotic changes are present. The main pulmonary artery is normal in caliber. The heart is normal in size. There are coronary calcifications with calcifications of the aortic annulus. There is no free fluid or pathologic lymphadenopathy by size criteria. The trachea and mainstem bronchi appear patent. Again noted are post radiation changes in the right hilar region. The liver contains a stable tiny cysts and hemangioma. The gallbladder is normal. The spleen is normal. Pancreas is normal. The adrenals are normal. The kidneys are normal. The  abdominal aorta is normal in caliber. Stable cystic structure in the right retroperitoneal region suggestive of a benign lesion such as lymphatic malformation. The bladder is normal. The prostate is mildly enlarged. Large and small bowel loops are otherwise within normal limits. There is no free fluid are concerning lymphadenopathy. Stable sclerotic osseous lesions are noted within the spine and bony pelvis. IMPRESSION: Stable sclerotic metastases within the spine and bony pelvis. Post treatment related fibrosis in the perihilar region with otherwise no evidence for metastatic disease within the chest, abdomen, or pelvis. DOSE REDUCTION: This exam was performed according to our departmental dose-optimization program which includes automated exposure control, adjustment of the mA and/or kV according to patient size and/or use of iterative reconstruction technique. Electronically signed by: [place] Engel MD 12/26/2023 07:10 PM EDT RP Workstation: [id number]   CT ABDOMEN PELVIS W CONTRAST Result Date: 12/26/2023 EXAMINATION: CT ABDOMEN PELVIS W CONTRAST (accession [id number] [facility]), CT CHEST W CONTRAST (accession [id number] [facility]) CLINICAL INDICATION: Male, 68 years old. Non-small cell lung cancer (NSCLC), staging TECHNIQUE: Axial CT of the chest, abdomen and pelvis with 100 cc Omnipaque  300 intravenous contrast. Multiplanar reformations provided. Unless otherwise specified, incidental thyroid , adrenal, renal lesions do not require dedicated imaging follow up. Additionally, any mentioned pulmonary nodules do not require dedicated imaging follow-up based on the Fleischner guidelines unless otherwise specified. Coronary calcifications are not identified unless otherwise specified. COMPARISON: 06/25/2023 FINDINGS: The thyroid  appears normal. Thoracic aorta is normal in caliber. Scattered atherosclerotic changes are present. The main pulmonary artery is normal in caliber. The heart is normal in size. There are coronary  calcifications with calcifications of the aortic annulus. There is no free fluid or pathologic lymphadenopathy by size criteria. The trachea and mainstem bronchi appear patent. Again noted are post radiation changes in the right hilar region. The liver contains a stable tiny cysts and hemangioma. The gallbladder is normal. The spleen is normal. Pancreas is normal. The adrenals are normal. The kidneys are normal. The abdominal aorta is normal in caliber. Stable cystic structure in the right retroperitoneal region suggestive of a benign lesion such as lymphatic malformation. The bladder is normal. The prostate is mildly enlarged. Large and small bowel loops are otherwise within normal limits. There is no free fluid are concerning lymphadenopathy. Stable sclerotic osseous lesions are noted within the spine and bony pelvis. IMPRESSION: Stable sclerotic metastases within the spine and bony pelvis. Post treatment related fibrosis in the perihilar region with otherwise no evidence for metastatic disease within the chest, abdomen, or pelvis. DOSE REDUCTION: This exam was performed according to  our departmental dose-optimization program which includes automated exposure control, adjustment of the mA and/or kV according to patient size and/or use of iterative reconstruction technique. Electronically signed by: [place] Engel MD 12/26/2023 07:10 PM EDT RP Workstation: [id number]     ASSESSMENT AND PLAN: This is a very pleasant 68 years old white male recently diagnosed with likely metastatic non-small cell lung cancer initially diagnosed as stage IIIb non-small cell lung cancer, adenocarcinoma with no actionable mutation.  He completed a course of concurrent chemoradiation with weekly carboplatin  and paclitaxel  status post 7 cycles.   His treatment was complicated with dysphagia, odynophagia secondary to radiation induced esophagitis.  He also has persistent cough and shortness of breath secondary to radiation-induced  pneumonitis. He has partial response after this treatment. The patient completed treatment with consolidation immunotherapy with Imfinzi  1500 mg IV every 4 weeks, status post 13 cycles.  Last dose was in January 2022. The patient tolerated his previous treatment fairly well with no concerning adverse effects. The patient had evidence for disease metastasis to the left humerus as well as medial right iliac wing and sacrum in March 2023 that was biopsy-proven to be metastatic non-small cell lung cancer, adenocarcinoma. The patient received palliative radiotherapy to these areas under the care of Dr. Eloise Hake.. The patient is feeling fine today with no concerning complaints except for the persistent back pain. He underwent ablation by orthopedic surgery but it was not helpful. He recently underwent ablation by interventional radiology twice and his pain level has significantly improved.  But this started increasing again.  He tried to reach to his spine surgery group at [facility] in [place]  but unfortunately his request and referral was declined. He is currently on observation and feeling fine. He had repeat CT scan of the chest, abdomen and pelvis performed recently.  I personally independently reviewed the scan and discussed the results with the patient today. Assessment and Plan    Malignant neoplasm of bronchus and lung, unspecified Metastatic non-small cell lung cancer, adenocarcinoma, initially diagnosed as stage 3B in October 2020 with bone metastasis identified in March 2023. Recent CT scan of the chest, abdomen, and pelvis shows no progression of disease. He reports feeling well with no significant side effects from previous treatments. Continued monitoring is necessary due to potential for metastasis to other areas. The decision was made to extend the interval between CT scans to nine months, with the understanding that he will report any concerning symptoms immediately. - CT scans every nine  months. - Report any concerning symptoms immediately.  Chronic pain Chronic pain previously managed with supplements, which reduced pain levels significantly. He has discontinued supplements for two weeks without recurrence of significant pain. Reports morning stiffness, likely related to arthritis.  Unspecified weight loss He experienced significant weight loss, dropping from 185 pounds to 160 pounds, but has since regained to 170 pounds. Weight loss may be related to cancer or treatment effects. He is engaging in increased physical activity and weight lifting.   He was advised to call immediately if he has any other concerning symptoms in the interval. The patient voices understanding of current disease status and treatment options and is in agreement with the current care plan. All questions were answered. The patient knows to call the clinic with any problems, questions or concerns. We can certainly see the patient much sooner if necessary. The total time spent in the appointment was 30 minutes including review of chart and various tests results, discussions about plan of care and  coordination of care plan .   Disclaimer: This note was dictated with voice recognition software. Similar sounding words can inadvertently be transcribed and may not be corrected upon review.

## 2024-02-05 ENCOUNTER — Telehealth: Payer: Self-pay | Admitting: Medical Oncology

## 2024-02-05 NOTE — Telephone Encounter (Signed)
 Alan [name] reports a weight loss of 10-15 lbs over the past 6 months. He states that he has no appetite and feels a lack of energy. He also mentions experiencing significant anxiety, though he is able to sleep at night with the aid of Melatonin and magnesium . He denies any nausea, vomiting, diarrhea, or constipation. [name] requested that I relay this information to Dr. Sherrod so that he is aware of his current symptoms.

## 2024-02-08 ENCOUNTER — Telehealth: Payer: Self-pay | Admitting: Medical Oncology

## 2024-02-08 ENCOUNTER — Other Ambulatory Visit: Payer: Self-pay | Admitting: Medical Oncology

## 2024-02-08 NOTE — Telephone Encounter (Signed)
 Patient called regarding his depression and inquiring about Dr. Jeannett recommendations for same. Upon speaking with the patient, he was speaking rapidly and not pausing his speech.  I was able to tell him that Dr Sherrod advised him to reach out to his primary care provider (PCP) as well as Dr. Roetta for further guidance on managing his depression. He voiced understanding and  thanked me for the information and mentioned that he would be contacting Dr. Roetta @ [facility] next.

## 2024-03-08 ENCOUNTER — Other Ambulatory Visit: Payer: Self-pay

## 2024-03-08 ENCOUNTER — Emergency Department (HOSPITAL_COMMUNITY)

## 2024-03-08 ENCOUNTER — Encounter (HOSPITAL_COMMUNITY): Payer: Self-pay | Admitting: Emergency Medicine

## 2024-03-08 ENCOUNTER — Emergency Department (EMERGENCY_DEPARTMENT_HOSPITAL)
Admission: EM | Admit: 2024-03-08 | Discharge: 2024-03-08 | Disposition: A | Discharge status: Home / Self Care | Source: Home / Self Care | Attending: Emergency Medicine | Admitting: Emergency Medicine

## 2024-03-08 DIAGNOSIS — R0789 Other chest pain: Secondary | ICD-10-CM | POA: Diagnosis not present

## 2024-03-08 DIAGNOSIS — Z79899 Other long term (current) drug therapy: Secondary | ICD-10-CM | POA: Insufficient documentation

## 2024-03-08 DIAGNOSIS — I2510 Atherosclerotic heart disease of native coronary artery without angina pectoris: Secondary | ICD-10-CM | POA: Diagnosis not present

## 2024-03-08 DIAGNOSIS — I10 Essential (primary) hypertension: Secondary | ICD-10-CM | POA: Insufficient documentation

## 2024-03-08 DIAGNOSIS — I214 Non-ST elevation (NSTEMI) myocardial infarction: Secondary | ICD-10-CM | POA: Diagnosis not present

## 2024-03-08 DIAGNOSIS — R072 Precordial pain: Secondary | ICD-10-CM | POA: Insufficient documentation

## 2024-03-08 DIAGNOSIS — T82855A Stenosis of coronary artery stent, initial encounter: Secondary | ICD-10-CM | POA: Diagnosis not present

## 2024-03-08 DIAGNOSIS — Z85118 Personal history of other malignant neoplasm of bronchus and lung: Secondary | ICD-10-CM | POA: Insufficient documentation

## 2024-03-08 LAB — CBC
HCT: 37.7 % — ABNORMAL LOW (ref 39.0–52.0)
Hemoglobin: 12.5 g/dL — ABNORMAL LOW (ref 13.0–17.0)
MCH: 32.1 pg (ref 26.0–34.0)
MCHC: 33.2 g/dL (ref 30.0–36.0)
MCV: 96.9 fL (ref 80.0–100.0)
Platelets: 158 K/uL (ref 150–400)
RBC: 3.89 MIL/uL — ABNORMAL LOW (ref 4.22–5.81)
RDW: 12.6 % (ref 11.5–15.5)
WBC: 6 K/uL (ref 4.0–10.5)
nRBC: 0 % (ref 0.0–0.2)

## 2024-03-08 LAB — BASIC METABOLIC PANEL WITH GFR
Anion gap: 7 (ref 5–15)
BUN: 14 mg/dL (ref 8–23)
CO2: 22 mmol/L (ref 22–32)
Calcium: 8.9 mg/dL (ref 8.9–10.3)
Chloride: 109 mmol/L (ref 98–111)
Creatinine, Ser: 0.8 mg/dL (ref 0.61–1.24)
GFR, Estimated: >60 mL/min (ref >60)
Glucose, Bld: 141 mg/dL — ABNORMAL HIGH (ref 70–99)
Potassium: 3.6 mmol/L (ref 3.5–5.1)
Sodium: 138 mmol/L (ref 135–145)

## 2024-03-08 LAB — TROPONIN I (HIGH SENSITIVITY)
Troponin I (High Sensitivity): 47 ng/L — ABNORMAL HIGH (ref <18)
Troponin I (High Sensitivity): 59 ng/L — ABNORMAL HIGH (ref <18)

## 2024-03-08 MED ORDER — FAMOTIDINE 20 MG PO TABS
40.0000 mg | ORAL_TABLET | Freq: Once | ORAL | Status: AC
  Administered 2024-03-08: 40 mg via ORAL
  Filled 2024-03-08: qty 2

## 2024-03-08 MED ORDER — SUCRALFATE 1 G PO TABS
1.0000 g | ORAL_TABLET | Freq: Three times a day (TID) | ORAL | 0 refills | Status: DC
Start: 2024-03-08 — End: 2024-04-01

## 2024-03-08 MED ORDER — DIAZEPAM 5 MG PO TABS
5.0000 mg | ORAL_TABLET | Freq: Once | ORAL | Status: AC
  Administered 2024-03-08: 5 mg via ORAL
  Filled 2024-03-08: qty 1

## 2024-03-08 MED ORDER — DIAZEPAM 2 MG PO TABS: 2.0000 mg | ORAL_TABLET | Freq: Two times a day (BID) | ORAL | 0 refills | Status: DC | PRN

## 2024-03-08 MED ORDER — FAMOTIDINE 20 MG PO TABS: 20.0000 mg | ORAL_TABLET | Freq: Two times a day (BID) | ORAL | 0 refills | Status: DC

## 2024-03-08 MED ORDER — ALUM & MAG HYDROXIDE-SIMETH 200-200-20 MG/5ML PO SUSP
30.0000 mL | Freq: Once | ORAL | Status: AC
  Administered 2024-03-08: 30 mL via ORAL
  Filled 2024-03-08: qty 30

## 2024-03-08 NOTE — Discharge Instructions (Addendum)
 Please reach out to Dr Genice office Monday morning to request a follow up appointment.

## 2024-03-08 NOTE — Consult Note (Signed)
 Cardiology Consultation   Patient ID: Alan [name] MRN: [id number]; DOB: July 28, 1956  Admit date: 03/08/2024 Date of Consult: 03/08/2024  PCP:  Leonel Cole, MD   [facility] Providers Cardiologist:  [name], MD        Patient Profile: Alan [name] is a 68 y.o. male with a hx of LAD stent who is being seen 03/08/2024 for the evaluation of chest pain at the request of the ED.  History of Present Illness: Alan [name] reports 2-3 days of chest heaviness in low substernal/epigastric region that travels upwards and down B arms, worse lying down, can happen first thing in the AM when waking up, improves with sitting up and occasionally w/ drinking water.  Feels dehydrated.  No SOB.  Received 1 nitroglycerin  w/o improvement in symptoms.  Usually does spin class/lifts weights, not associated w/ exertion.  Has been intermittent the last few days but seems to be worsening.  He tells me the heaviness is similar to prior cardiac discomfort, but previously his discomfort did not change w/ position and did not wax/wane spontaneously as it's doing now.  ECG benign Trop 47, 2nd level pending  Prior AVNRT ablation, mitral valvular repair Also prior lung CA At home, just takes ASA and statin, and BP well controlled.   Past Medical History:  Diagnosis Date   Adenocarcinoma of right lung, stage 3 (HCC) dx'd 03/2019   CAD S/P percutaneous coronary angioplasty 2004   (Ant STEMI) - Prox LAD 100% => 2 overlapping 3.5 x 1.8 mm Cypher DES stents.;  Patent as of August 2015   Dyslipidemia, goal LDL below 70    Essential hypertension    GERD (gastroesophageal reflux disease)    History of Mitral valve prolapse    Moderate - with moderate MR, noted February t 2013   History of radiation therapy    Right Pelvis & left humerus- 12/05/21-12/16/21- Dr. Lynwood Nasuti   History of Severe mitral regurgitation by prior echocardiogram 02/06/2014   TEE: Severe mitral regurgitation with a flail P2  segment and ruptured; Normal LV size & function, dilated LA.   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    Past Surgical History:  Procedure Laterality Date   ANTERIOR CRUCIATE LIGAMENT REPAIR Left 1993   COLONOSCOPY     INTRAOPERATIVE TRANSESOPHAGEAL ECHOCARDIOGRAM N/A 04/08/2014   Procedure: INTRAOPERATIVE TRANSESOPHAGEAL ECHOCARDIOGRAM;  Surgeon: Sudie VEAR Laine, MD;  Location: [facility] OR;  Service: Open Heart Surgery;  Laterality: N/A;   IR RADIOLOGIST EVAL & MGMT  10/04/2022   IR RADIOLOGIST EVAL & MGMT  11/01/2022   IR RADIOLOGIST EVAL & MGMT  12/06/2022   IR RADIOLOGIST EVAL & MGMT  01/23/2023   LEFT AND RIGHT HEART CATHETERIZATION WITH CORONARY ANGIOGRAM N/A 03/05/2014   Procedure: LEFT AND RIGHT HEART CATHETERIZATION WITH CORONARY ANGIOGRAM;  Surgeon: Alm LELON Clay, MD;  Location: [facility] CATH LAB;  Service: Cardiovascular; (  pre-op) Widely patent LAD stents, 40% distal LAD, 30% Cx, ~50% PL-PDA bifurcation lesion    LEFT HEART CATH AND CORONARY ANGIOGRAPHY  2004   In setting of anterior STEMI, found to have 100% % proximal LAD (2 DES Cypher stents)   LEFT HEART CATH AND CORONARY ANGIOGRAPHY  2007   Widely patent LAD stents, 40% distal LAD, 30% Cx, ~50% PL-PDA bifurcation lesion    LEFT HEART CATHETERIZATION WITH CORONARY ANGIOGRAM N/A 09/15/2011   Procedure: LEFT HEART  CATHETERIZATION WITH CORONARY ANGIOGRAM;  Surgeon: Dorn JINNY Lesches, MD;  Location: [facility] CATH LAB;  Service: Cardiovascular;  Laterality: N/A;   MITRAL VALVE REPAIR Right 04/08/2014   Procedure: MINIMALLY INVASIVE MITRAL VALVE REPAIR (MVR);  Surgeon: Sudie VEAR Laine, MD;  Location: [facility] OR;  Service: Open Heart Surgery;  Laterality: Right;   NM MYOVIEW  LTD  12/2009   Walk 9 minutes, and 10 METs, diaphragmatic attenuation but no ischemia or infarction.   SVT ABLATION N/A 10/17/2019   Procedure: SVT ABLATION;  Surgeon: Inocencio Soyla Lunger, MD;  Location: [facility] INVASIVE CV LAB;  Service: Cardiovascular;  Laterality: N/A;   TEE WITHOUT CARDIOVERSION N/A 02/06/2014   Procedure: TRANSESOPHAGEAL ECHOCARDIOGRAM (TEE);  Surgeon: Vinie KYM Maxcy, MD;  Normal LV Size U& function - EF 55-60%, no regional WMA.  MV P2 Leaflet is flail with ruptured chord with severe prolapse, anterior leaflet intact.  Severe, eccentric anterior directed MR with dilated LA.   TRANSTHORACIC ECHOCARDIOGRAM  07/18/2018   Normal LV size and function.  EF 50-60 %.  Unable to assess diastolic function.  Mitral valve sewing ring in place.  Mild stenosis with a gradient of 6 mmHg noted. -   TRANSTHORACIC ECHOCARDIOGRAM  09/30/2021   Stable findings.  EF 50 to 55%.  No RWMA.  Mitral valve stable.  No significant MR.  Normal aortic valve.  Mild aortic dilation, but probably normal for age.   VIDEO BRONCHOSCOPY WITH ENDOBRONCHIAL ULTRASOUND N/A 05/16/2019   Procedure: VIDEO BRONCHOSCOPY WITH ENDOBRONCHIAL ULTRASOUND;  Surgeon: Mannam, Praveen, MD;  Location: [facility] OR;  Service: Pulmonary;  Laterality: N/A;       Scheduled Meds:  Continuous Infusions:  PRN Meds:   Allergies:    Allergies  Allergen Reactions   A-Cillin [Ampicillin] Shortness Of Breath, Swelling and Rash    Did it involve swelling of the face/tongue/throat, SOB, or low BP? Yes Did it involve sudden or severe rash/hives, skin peeling, or any reaction on the inside of your  mouth or nose? Yes Did you need to seek medical attention at a hospital or doctor's office? Yes When did it last happen? ~20 years ago If all above answers are NO, may proceed with cephalosporin use.     Amoxicillin Hives, Shortness Of Breath and Swelling    Did it involve swelling of the face/tongue/throat, SOB, or low BP? Yes Did it involve sudden or severe rash/hives, skin peeling, or any reaction on the inside of your mouth or nose? Yes Did you need to seek medical attention at a hospital or doctor's office? Yes When did it last happen? ~20 years ago If all above answers are NO, may proceed with cephalosporin use.    Other Hives, Shortness Of Breath and Swelling    ALL CILLINS   Penicillins Hives, Shortness Of Breath and Swelling    Did it involve swelling of the face/tongue/throat, SOB, or low BP? Yes Did it involve sudden or severe rash/hives, skin peeling, or any reaction on the inside of your mouth or  nose? Yes Did you need to seek medical attention at a hospital or doctor's office? Yes When did it last happen? ~20 years ago If all above answers are NO, may proceed with cephalosporin use.     Sulfa Antibiotics Nausea Only   Crestor  [Rosuvastatin ] Other (See Comments)    G I upset    Social History:   Social History   Socioeconomic History   Marital status: Married    Spouse name: Not on file   Number of children: Not on file   Years of education: Not on file   Highest education level: Not on file  Occupational History   Not on file  Tobacco Use   Smoking status: Never   Smokeless tobacco: Never  Vaping Use   Vaping status: Never Used  Substance and Sexual Activity   Alcohol use: Yes    Alcohol/week: 10.0 standard drinks of alcohol    Types: 5 Cans of beer, 5 Shots of liquor per week    Comment: social   Drug use: No   Sexual activity: Not on file  Other Topics Concern   Not on file  Social History Narrative   He is a married father of one.  Exercises avidly as noted above - runs routinely at least 3 miles 3-4 times a day.  He drinks his Xango fruit juce - 32 Oz. Daily.     Never smoked and only takes occasional alcohol   Social Drivers of Corporate investment banker Strain: Not on file  Food Insecurity: Not on file  Transportation Needs: Not on file  Physical Activity: Not on file  Stress: Not on file  Social Connections: Not on file  Intimate Partner Violence: Not on file    Family History:    Family History  Problem Relation Age of Onset   Hypertension Mother    Heart Problems Father        triple bypass 1989   Cancer Paternal Grandmother        Pancreatic     ROS:  Please see the history of present illness.   All other ROS reviewed and negative.     Physical Exam/Data: Vitals:   03/08/24 1815 03/08/24 1826 03/08/24 1830 03/08/24 1845  BP: 104/79 104/72 95/72   Pulse: 84 83 79   Resp: 18 18 (!) 23   Temp:    (!) 97.5 F (36.4 C)  TempSrc:    Oral  SpO2: 100% 100% 100%   Weight:      Height:       No intake or output data in the 24 hours ending 03/08/24 1851    03/08/2024    2:56 PM 01/02/2024    1:42 PM 06/28/2023    1:40 PM  Last 3 Weights  Weight (lbs) 175 lb 170 lb 8 oz 178 lb  Weight (kg) 79.379 kg 77.338 kg 80.74 kg     Body mass index is 23.73 kg/m.  General:  Well nourished, well developed, in no acute distress HEENT: normal Neck: no JVD Vascular: No carotid bruits; Distal pulses 2+ bilaterally Cardiac:  normal S1, S2; RRR; no murmur  Lungs:  clear to auscultation bilaterally, no wheezing, rhonchi or rales  Abd: soft, nontender, no hepatomegaly  Ext: no edema Musculoskeletal:  No deformities, BUE and BLE strength normal and equal Skin: warm and dry  Neuro:  CNs 2-12 intact, no focal abnormalities noted Psych:  Normal affect    Laboratory Data: High Sensitivity Troponin:   Recent  Labs  Lab 03/08/24 1502  TROPONINIHS 47*     Chemistry Recent Labs  Lab 03/08/24 1502  NA  138  K 3.6  CL 109  CO2 22  GLUCOSE 141*  BUN 14  CREATININE 0.80  CALCIUM  8.9  GFRNONAA >60  ANIONGAP 7    No results for input(s): PROT, ALBUMIN , AST, ALT, ALKPHOS, BILITOT in the last 168 hours. Lipids No results for input(s): CHOL, TRIG, HDL, LABVLDL, LDLCALC, CHOLHDL in the last 168 hours.  Hematology Recent Labs  Lab 03/08/24 1502  WBC 6.0  RBC 3.89*  HGB 12.5*  HCT 37.7*  MCV 96.9  MCH 32.1  MCHC 33.2  RDW 12.6  PLT 158   Thyroid  No results for input(s): TSH, FREET4 in the last 168 hours.  BNPNo results for input(s): BNP, PROBNP in the last 168 hours.  DDimer No results for input(s): DDIMER in the last 168 hours.  Radiology/Studies:  DG Chest 2 View Result Date: 03/08/2024 CLINICAL DATA:  cp EXAM: CHEST - 2 VIEW COMPARISON:  07/01/2019 FINDINGS: Pulmonary hyperinflation. Right perihilar fibrosis. No new infiltrate or nodule. Heart size and mediastinal contours are within normal limits. Post valve surgery. No effusion. 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 IMPRESSION: 1. No acute cardiopulmonary disease. 2. Right perihilar fibrosis. Electronically Signed   By: JONETTA Faes M.D.   On: 03/08/2024 16:05    Last echo in 2023 with preserved EF and well seated mitral ring without much mitral gradient.  Assessment and Plan: Atypical chest discomfort, not typical for a cardiac etiology Might try a GI cocktail to see if some relief.  Will await 2nd troponin level, but if flat, he is low risk for ACS and might discharge home w/ a PPI trial.  Continue ASA with meals, along w/ statin Regular followup w/ cardiologist.   Risk Assessment/Risk Scores:              For questions or updates, please contact [facility] Please consult [email or web address] for contact info under    Signed, Andee Flatten, MD  03/08/2024 6:51 PM

## 2024-03-08 NOTE — ED Provider Notes (Signed)
[place] EMERGENCY DEPARTMENT AT [facility] Provider Note   CSN: [id number] Arrival date & time: 03/08/24  1444     Patient presents with: Chest Pain   Alan [name] is a 68 y.o. male w/ hx of LAD stent, presenting to ED with chest pain.  Reports having substernal pressure into both arms intermittently for a few days.  Not associated with activity specifically, can happen at rest, and did happen while eating today. 10/10 intensity this morning.  EMS gave aspirin  and 1 nitroglycerin  which did not relieve his pain at the time, but since arriving it has settled to 5/10 intensity.  He reports hx of MI, CAD and 2 cardiac stents.  Hx of lung cancer in remission.  Does not smoke.    Normally he exercises regularly and cycles 3 times per week, goes to the gym to lift the past month.   HPI     Prior to Admission medications   Medication Sig Start Date End Date Taking? Authorizing Provider  diazepam  (VALIUM ) 2 MG tablet Take 1 tablet (2 mg total) by mouth every 12 (twelve) hours as needed for up to 12 doses for anxiety. 03/08/24  Yes Cottie Donnice PARAS, MD  famotidine  (PEPCID ) 20 MG tablet Take 1 tablet (20 mg total) by mouth 2 (two) times daily. 03/08/24 03/14/2024 Yes Sabah Zucco, Donnice PARAS, MD  sucralfate  (CARAFATE ) 1 g tablet Take 1 tablet (1 g total) by mouth 3 (three) times daily before meals. 03/08/24 03/17/2024 Yes Nilza Eaker, Donnice PARAS, MD  Ascorbic Acid  (VITAMIN C ) 1000 MG tablet Take 2,000 mg by mouth daily.     [provider]  atorvastatin  (LIPITOR) 10 MG tablet Take 1 tablet (10 mg total) by mouth daily. 05/09/23   Anner Alm ORN, MD  Coenzyme Q10 (CO Q 10) 100 MG CAPS Take 100 mg by mouth daily.     [provider]  Hypromellose (HYDROXYPROPYL METHYLCELLULOSE) POWD  07/02/20   [provider]  latanoprost  (XALATAN ) 0.005 % ophthalmic solution Place 1 drop into both eyes every morning. 04/26/23   [provider]  magnesium  30 MG tablet Take 1 tablet by  mouth at bedtime.    [provider]  Melatonin 10 MG TABS Take 10 mg by mouth at bedtime.    [provider]  Naltrexone 380 MG SUSR Take 5 mg by mouth 2 (two) times daily. 01/08/24   Augoustides, Marsa, MD  OVER THE COUNTER MEDICATION Take 4 tablets by mouth in the morning, at noon, and at bedtime.  Relief Factor  from online.    [provider]  Specialty Vitamins Products (PROSTATE PO) Take 1 capsule by mouth daily. PROSTATE FORMULA    [provider]  traMADol  (ULTRAM ) 50 MG tablet Take 50 mg by mouth 3 (three) times daily as needed. 10/03/22   [provider]  vitamin B-12 (CYANOCOBALAMIN) 1000 MCG tablet Take 1,000 mcg by mouth daily.    [provider]  Vitamin D-Vitamin K (D3 + K2 DOTS PO) Take 1 tablet by mouth daily.    [provider]    Allergies: A-cillin [ampicillin], Amoxicillin, Other, Penicillins, Sulfa antibiotics, and Crestor  [rosuvastatin ]    Review of Systems  Updated Vital Signs BP 121/85   Pulse 81   Temp (!) 97.5 F (36.4 C) (Oral)   Resp 18   Ht 6' (1.829 m)   Wt 79.4 kg   SpO2 100%   BMI 23.73 kg/m   Physical Exam Constitutional:  General: He is not in acute distress. HENT:     Head: Normocephalic and atraumatic.  Eyes:     Conjunctiva/sclera: Conjunctivae normal.     Pupils: Pupils are equal, round, and reactive to light.  Cardiovascular:     Rate and Rhythm: Normal rate and regular rhythm.  Pulmonary:     Effort: Pulmonary effort is normal. No respiratory distress.  Abdominal:     General: There is no distension.     Tenderness: There is no abdominal tenderness.  Skin:    General: Skin is warm and dry.  Neurological:     General: No focal deficit present.     Mental Status: He is alert. Mental status is at baseline.  Psychiatric:        Mood and Affect: Mood normal.        Behavior: Behavior normal.     (all labs ordered are listed, but only abnormal results are  displayed) Labs Reviewed  BASIC METABOLIC PANEL WITH GFR - Abnormal; Notable for the following components:      Result Value   Glucose, Bld 141 (*)    All other components within normal limits  CBC - Abnormal; Notable for the following components:   RBC 3.89 (*)    Hemoglobin 12.5 (*)    HCT 37.7 (*)    All other components within normal limits  TROPONIN I (HIGH SENSITIVITY) - Abnormal; Notable for the following components:   Troponin I (High Sensitivity) 47 (*)    All other components within normal limits  TROPONIN I (HIGH SENSITIVITY) - Abnormal; Notable for the following components:   Troponin I (High Sensitivity) 59 (*)    All other components within normal limits    EKG: EKG Interpretation Date/Time:  Saturday March 08 2024 15:01:41 EDT Ventricular Rate:  91 PR Interval:  172 QRS Duration:  87 QT Interval:  386 QTC Calculation: 475 R Axis:   62  Text Interpretation: Sinus rhythm Consider right atrial enlargement Confirmed by Cottie Cough [phone]) on 03/08/2024 3:09:23 PM  Radiology: [facility] Chest 2 View Result Date: 03/08/2024 CLINICAL DATA:  cp EXAM: CHEST - 2 VIEW COMPARISON:  07/01/2019 FINDINGS: Pulmonary hyperinflation. Right perihilar fibrosis. No new infiltrate or nodule. Heart size and mediastinal contours are within normal limits. Post valve surgery. No effusion. 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 IMPRESSION: 1. No acute cardiopulmonary disease. 2. Right perihilar fibrosis. Electronically Signed   By: JONETTA Faes M.D.   On: 03/08/2024 16:05     Procedures   Medications Ordered in the ED  famotidine  (PEPCID ) tablet 40 mg (40 mg Oral Given 03/08/24 1833)  alum & mag hydroxide-simeth (MAALOX/MYLANTA) 200-200-20 MG/5ML suspension 30 mL (30 mLs Oral Given 03/08/24 1833)  diazepam  (VALIUM ) tablet 5 mg (5 mg Oral Given 03/08/24 2003)    Clinical Course as of 03/08/24 2011  Sat Mar 08, 2024  1658 Trop mildly increased -will consult with cardiology [MT]   [phone] Cardiology consulted Dr Cesario [MT]  [phone] Cardiology consultant requested GI medications, awaiting 2nd troponin but feels this pain is quite atypical for cardiac pain.   [MT]  1958 On reassessment patient reports he is chest pain free.  However he states his pain was very minimal before his GI cocktail, but now gone. I think it's possible this is gastric reflux or even esophageal spasm - we can try adding pepcid  and carafate  to his daily PPI, and valium  PRN for possible spasms.  However we will also have him follow  up closely with Dr Anner his cardiologist given his known coronary history and very minor troponin elevations (his levels appear to be chronically elevated, lower now than in the past) [MT]  2011 Will give evening valium  dose now as patient cannot get to his pharmacy tonight  [MT]  2011 His wife will come pick him up [MT]    Clinical Course User Index [MT] Jezabelle Chisolm, Donnice PARAS, MD                                 Medical Decision Making Amount and/or Complexity of Data Reviewed Labs: ordered. Radiology: ordered.  Risk OTC drugs. Prescription drug management.   This patient presents to the Emergency Department with complaint of chest pain. This involves an extensive number of treatment options, and is a complaint that carries with it a high risk of complications and morbidity, given the patient's comorbidity, including hx of CAD and stenting .The differential diagnosis includes ACS vs Pneumothorax vs Reflux/Gastritis vs Esophageal spasm vs MSK pain vs Pneumonia vs other.  I felt PE was less likely given that symptoms are intermittent; there is no hypoxia or tachycardia, no other PE risk factors  I ordered, reviewed, and interpreted labs.  Pertinent results include WBC wnl.  Trop 47 -> 59 I ordered medication GI cocktail for chest pain/gastritis; valium  for possible esophageal spasm I ordered imaging studies which included dg chest  I independently visualized and interpreted  imaging which showed no emergent findings, and the monitor tracing which showed NSR . I agree with the radiologist interpretation Additional history was obtained from EMS External records obtained and reviewed showing cardiology office records Dr Anner - hx of CAD, stent I personally reviewed the patients ECG which showed sinus rhythm with no acute ischemic findings  After the interventions stated above, I reevaluated the patient and found that they were pain free  Based on the patient's clinical exam, vital signs, risk factors, and ED testing, I felt that the patient's overall risk of life-threatening emergency such as ACS, PE, sepsis, or infection was low.  At this time, I felt the patient's presentation was most clinically consistent with nonspecific chest pain, but explained to the patient that this evaluation was not a definitive diagnostic workup.  I discussed outpatient follow up with primary care provider, and provided specialist office number on the patient's discharge paper if a referral was deemed necessary.  Return precautions were discussed with the patient.  I felt the patient was clinically stable for discharge.      Final diagnoses:  Chest discomfort    ED Discharge Orders          Ordered    famotidine  (PEPCID ) 20 MG tablet  2 times daily        03/08/24 2004    sucralfate  (CARAFATE ) 1 g tablet  3 times daily before meals        03/08/24 2004    diazepam  (VALIUM ) 2 MG tablet  Every 12 hours PRN        03/08/24 2005               Cottie Donnice PARAS, MD 03/08/24 2011

## 2024-03-08 NOTE — ED Triage Notes (Signed)
 Pt BIB [facility] from home for intermittent CP over the last 4-5 days.  Episode unresolved CP last night that was eval by EMS but eventually resolved, did not transport.  Next episode of CP approx. 1300. Centralized, sharp to dull radiating to bilateral arms.  No NV.  Increased CP and Sob with exertion.   W/ hx of SVT, MI and stents, and lung cancer.   324 ASA, 1 nitroglycerin  with some relief from 7 to 5.  BP dropped from 130's to 106 SBP with nitroglycerin .   Inverted P-wave noted in V! Which was diff from last night.   18 G L. FA,  106/70 HR 104 99% RQA, CBG 120, RR 18

## 2024-03-10 ENCOUNTER — Encounter (HOSPITAL_COMMUNITY): Payer: Self-pay

## 2024-03-10 ENCOUNTER — Telehealth: Payer: Self-pay | Admitting: Cardiology

## 2024-03-10 ENCOUNTER — Encounter (HOSPITAL_COMMUNITY)
Admission: EM | Disposition: A | Discharge status: Other Acute Inpatient Hospital | Payer: Self-pay | Source: Ambulatory Visit | Attending: Thoracic Surgery (Cardiothoracic Vascular Surgery)

## 2024-03-10 ENCOUNTER — Inpatient Hospital Stay (HOSPITAL_COMMUNITY)

## 2024-03-10 ENCOUNTER — Inpatient Hospital Stay (HOSPITAL_COMMUNITY)
Admission: EM | Admit: 2024-03-10 | Discharge: 2024-04-01 | DRG: 003 | Disposition: A | Discharge status: Other Acute Inpatient Hospital | Attending: Thoracic Surgery (Cardiothoracic Vascular Surgery) | Admitting: Thoracic Surgery (Cardiothoracic Vascular Surgery)

## 2024-03-10 ENCOUNTER — Other Ambulatory Visit: Payer: Self-pay

## 2024-03-10 DIAGNOSIS — F419 Anxiety disorder, unspecified: Secondary | ICD-10-CM | POA: Diagnosis present

## 2024-03-10 DIAGNOSIS — R609 Edema, unspecified: Secondary | ICD-10-CM | POA: Diagnosis not present

## 2024-03-10 DIAGNOSIS — Z923 Personal history of irradiation: Secondary | ICD-10-CM

## 2024-03-10 DIAGNOSIS — I21A9 Other myocardial infarction type: Secondary | ICD-10-CM | POA: Diagnosis present

## 2024-03-10 DIAGNOSIS — T8111XA Postprocedural  cardiogenic shock, initial encounter: Secondary | ICD-10-CM | POA: Diagnosis not present

## 2024-03-10 DIAGNOSIS — J9601 Acute respiratory failure with hypoxia: Secondary | ICD-10-CM | POA: Diagnosis not present

## 2024-03-10 DIAGNOSIS — G9340 Encephalopathy, unspecified: Secondary | ICD-10-CM | POA: Diagnosis not present

## 2024-03-10 DIAGNOSIS — Z9911 Dependence on respirator [ventilator] status: Secondary | ICD-10-CM

## 2024-03-10 DIAGNOSIS — Z952 Presence of prosthetic heart valve: Secondary | ICD-10-CM | POA: Diagnosis not present

## 2024-03-10 DIAGNOSIS — J939 Pneumothorax, unspecified: Secondary | ICD-10-CM | POA: Diagnosis not present

## 2024-03-10 DIAGNOSIS — J942 Hemothorax: Secondary | ICD-10-CM | POA: Diagnosis not present

## 2024-03-10 DIAGNOSIS — Z9889 Other specified postprocedural states: Secondary | ICD-10-CM | POA: Diagnosis not present

## 2024-03-10 DIAGNOSIS — A419 Sepsis, unspecified organism: Secondary | ICD-10-CM | POA: Diagnosis not present

## 2024-03-10 DIAGNOSIS — E785 Hyperlipidemia, unspecified: Secondary | ICD-10-CM | POA: Diagnosis not present

## 2024-03-10 DIAGNOSIS — E440 Moderate protein-calorie malnutrition: Secondary | ICD-10-CM | POA: Diagnosis not present

## 2024-03-10 DIAGNOSIS — G7281 Critical illness myopathy: Secondary | ICD-10-CM | POA: Diagnosis not present

## 2024-03-10 DIAGNOSIS — I2510 Atherosclerotic heart disease of native coronary artery without angina pectoris: Secondary | ICD-10-CM | POA: Diagnosis present

## 2024-03-10 DIAGNOSIS — I71012 Dissection of descending thoracic aorta: Secondary | ICD-10-CM | POA: Diagnosis not present

## 2024-03-10 DIAGNOSIS — I770 Arteriovenous fistula, acquired: Secondary | ICD-10-CM | POA: Diagnosis not present

## 2024-03-10 DIAGNOSIS — T82898A Other specified complication of vascular prosthetic devices, implants and grafts, initial encounter: Secondary | ICD-10-CM | POA: Diagnosis not present

## 2024-03-10 DIAGNOSIS — G9341 Metabolic encephalopathy: Secondary | ICD-10-CM | POA: Diagnosis not present

## 2024-03-10 DIAGNOSIS — E870 Hyperosmolality and hypernatremia: Secondary | ICD-10-CM | POA: Diagnosis not present

## 2024-03-10 DIAGNOSIS — I4891 Unspecified atrial fibrillation: Secondary | ICD-10-CM | POA: Diagnosis not present

## 2024-03-10 DIAGNOSIS — I5023 Acute on chronic systolic (congestive) heart failure: Secondary | ICD-10-CM | POA: Diagnosis not present

## 2024-03-10 DIAGNOSIS — C7801 Secondary malignant neoplasm of right lung: Secondary | ICD-10-CM | POA: Diagnosis not present

## 2024-03-10 DIAGNOSIS — I471 Supraventricular tachycardia, unspecified: Secondary | ICD-10-CM | POA: Diagnosis not present

## 2024-03-10 DIAGNOSIS — R739 Hyperglycemia, unspecified: Secondary | ICD-10-CM | POA: Diagnosis not present

## 2024-03-10 DIAGNOSIS — I5020 Unspecified systolic (congestive) heart failure: Secondary | ICD-10-CM | POA: Diagnosis not present

## 2024-03-10 DIAGNOSIS — I6340 Cerebral infarction due to embolism of unspecified cerebral artery: Secondary | ICD-10-CM | POA: Diagnosis not present

## 2024-03-10 DIAGNOSIS — J1529 Pneumonia due to other staphylococcus: Secondary | ICD-10-CM | POA: Diagnosis not present

## 2024-03-10 DIAGNOSIS — I214 Non-ST elevation (NSTEMI) myocardial infarction: Principal | ICD-10-CM | POA: Diagnosis present

## 2024-03-10 DIAGNOSIS — J14 Pneumonia due to Hemophilus influenzae: Secondary | ICD-10-CM | POA: Diagnosis not present

## 2024-03-10 DIAGNOSIS — R238 Other skin changes: Secondary | ICD-10-CM | POA: Diagnosis not present

## 2024-03-10 DIAGNOSIS — Z681 Body mass index (BMI) 19 or less, adult: Secondary | ICD-10-CM

## 2024-03-10 DIAGNOSIS — I4821 Permanent atrial fibrillation: Secondary | ICD-10-CM | POA: Diagnosis not present

## 2024-03-10 DIAGNOSIS — R29732 NIHSS score 32: Secondary | ICD-10-CM | POA: Diagnosis not present

## 2024-03-10 DIAGNOSIS — R569 Unspecified convulsions: Secondary | ICD-10-CM | POA: Diagnosis not present

## 2024-03-10 DIAGNOSIS — Z951 Presence of aortocoronary bypass graft: Secondary | ICD-10-CM | POA: Diagnosis not present

## 2024-03-10 DIAGNOSIS — J690 Pneumonitis due to inhalation of food and vomit: Secondary | ICD-10-CM | POA: Diagnosis not present

## 2024-03-10 DIAGNOSIS — Z8249 Family history of ischemic heart disease and other diseases of the circulatory system: Secondary | ICD-10-CM

## 2024-03-10 DIAGNOSIS — D65 Disseminated intravascular coagulation [defibrination syndrome]: Secondary | ICD-10-CM | POA: Diagnosis not present

## 2024-03-10 DIAGNOSIS — G928 Other toxic encephalopathy: Secondary | ICD-10-CM | POA: Diagnosis not present

## 2024-03-10 DIAGNOSIS — I472 Ventricular tachycardia, unspecified: Secondary | ICD-10-CM | POA: Diagnosis not present

## 2024-03-10 DIAGNOSIS — Y95 Nosocomial condition: Secondary | ICD-10-CM | POA: Diagnosis not present

## 2024-03-10 DIAGNOSIS — R54 Age-related physical debility: Secondary | ICD-10-CM | POA: Diagnosis present

## 2024-03-10 DIAGNOSIS — J189 Pneumonia, unspecified organism: Secondary | ICD-10-CM | POA: Diagnosis not present

## 2024-03-10 DIAGNOSIS — T82855A Stenosis of coronary artery stent, initial encounter: Secondary | ICD-10-CM | POA: Diagnosis present

## 2024-03-10 DIAGNOSIS — R4182 Altered mental status, unspecified: Secondary | ICD-10-CM | POA: Diagnosis not present

## 2024-03-10 DIAGNOSIS — I252 Old myocardial infarction: Secondary | ICD-10-CM

## 2024-03-10 DIAGNOSIS — I25110 Atherosclerotic heart disease of native coronary artery with unstable angina pectoris: Secondary | ICD-10-CM

## 2024-03-10 DIAGNOSIS — M7989 Other specified soft tissue disorders: Secondary | ICD-10-CM | POA: Diagnosis not present

## 2024-03-10 DIAGNOSIS — I081 Rheumatic disorders of both mitral and tricuspid valves: Secondary | ICD-10-CM | POA: Diagnosis present

## 2024-03-10 DIAGNOSIS — Z7982 Long term (current) use of aspirin: Secondary | ICD-10-CM

## 2024-03-10 DIAGNOSIS — J90 Pleural effusion, not elsewhere classified: Secondary | ICD-10-CM

## 2024-03-10 DIAGNOSIS — C7951 Secondary malignant neoplasm of bone: Secondary | ICD-10-CM | POA: Diagnosis present

## 2024-03-10 DIAGNOSIS — R079 Chest pain, unspecified: Secondary | ICD-10-CM | POA: Diagnosis not present

## 2024-03-10 DIAGNOSIS — I724 Aneurysm of artery of lower extremity: Secondary | ICD-10-CM | POA: Diagnosis not present

## 2024-03-10 DIAGNOSIS — F32A Depression, unspecified: Secondary | ICD-10-CM | POA: Diagnosis present

## 2024-03-10 DIAGNOSIS — G40901 Epilepsy, unspecified, not intractable, with status epilepticus: Secondary | ICD-10-CM | POA: Diagnosis present

## 2024-03-10 DIAGNOSIS — G40401 Other generalized epilepsy and epileptic syndromes, not intractable, with status epilepticus: Secondary | ICD-10-CM | POA: Diagnosis not present

## 2024-03-10 DIAGNOSIS — G4089 Other seizures: Secondary | ICD-10-CM | POA: Diagnosis not present

## 2024-03-10 DIAGNOSIS — E878 Other disorders of electrolyte and fluid balance, not elsewhere classified: Secondary | ICD-10-CM | POA: Diagnosis not present

## 2024-03-10 DIAGNOSIS — Z7189 Other specified counseling: Secondary | ICD-10-CM | POA: Diagnosis not present

## 2024-03-10 DIAGNOSIS — J9621 Acute and chronic respiratory failure with hypoxia: Secondary | ICD-10-CM | POA: Diagnosis not present

## 2024-03-10 DIAGNOSIS — Z9221 Personal history of antineoplastic chemotherapy: Secondary | ICD-10-CM

## 2024-03-10 DIAGNOSIS — Z515 Encounter for palliative care: Secondary | ICD-10-CM

## 2024-03-10 DIAGNOSIS — R1310 Dysphagia, unspecified: Secondary | ICD-10-CM | POA: Diagnosis not present

## 2024-03-10 DIAGNOSIS — I255 Ischemic cardiomyopathy: Secondary | ICD-10-CM | POA: Diagnosis present

## 2024-03-10 DIAGNOSIS — Z79899 Other long term (current) drug therapy: Secondary | ICD-10-CM | POA: Diagnosis not present

## 2024-03-10 DIAGNOSIS — G253 Myoclonus: Secondary | ICD-10-CM | POA: Diagnosis not present

## 2024-03-10 DIAGNOSIS — J9811 Atelectasis: Secondary | ICD-10-CM | POA: Diagnosis not present

## 2024-03-10 DIAGNOSIS — E860 Dehydration: Secondary | ICD-10-CM | POA: Diagnosis present

## 2024-03-10 DIAGNOSIS — R509 Fever, unspecified: Secondary | ICD-10-CM | POA: Diagnosis not present

## 2024-03-10 DIAGNOSIS — I7100 Dissection of unspecified site of aorta: Secondary | ICD-10-CM | POA: Diagnosis not present

## 2024-03-10 DIAGNOSIS — I2584 Coronary atherosclerosis due to calcified coronary lesion: Secondary | ICD-10-CM | POA: Diagnosis present

## 2024-03-10 DIAGNOSIS — G931 Anoxic brain damage, not elsewhere classified: Secondary | ICD-10-CM | POA: Diagnosis not present

## 2024-03-10 DIAGNOSIS — K219 Gastro-esophageal reflux disease without esophagitis: Secondary | ICD-10-CM | POA: Diagnosis present

## 2024-03-10 DIAGNOSIS — Y831 Surgical operation with implant of artificial internal device as the cause of abnormal reaction of the patient, or of later complication, without mention of misadventure at the time of the procedure: Secondary | ICD-10-CM | POA: Diagnosis present

## 2024-03-10 DIAGNOSIS — J9600 Acute respiratory failure, unspecified whether with hypoxia or hypercapnia: Secondary | ICD-10-CM | POA: Diagnosis not present

## 2024-03-10 DIAGNOSIS — Z6823 Body mass index (BMI) 23.0-23.9, adult: Secondary | ICD-10-CM

## 2024-03-10 DIAGNOSIS — G4700 Insomnia, unspecified: Secondary | ICD-10-CM | POA: Diagnosis present

## 2024-03-10 DIAGNOSIS — L8990 Pressure ulcer of unspecified site, unspecified stage: Secondary | ICD-10-CM | POA: Insufficient documentation

## 2024-03-10 DIAGNOSIS — Z930 Tracheostomy status: Secondary | ICD-10-CM | POA: Diagnosis not present

## 2024-03-10 DIAGNOSIS — G40409 Other generalized epilepsy and epileptic syndromes, not intractable, without status epilepticus: Secondary | ICD-10-CM | POA: Diagnosis not present

## 2024-03-10 DIAGNOSIS — I75023 Atheroembolism of bilateral lower extremities: Secondary | ICD-10-CM | POA: Diagnosis not present

## 2024-03-10 DIAGNOSIS — R627 Adult failure to thrive: Secondary | ICD-10-CM | POA: Diagnosis present

## 2024-03-10 DIAGNOSIS — I110 Hypertensive heart disease with heart failure: Secondary | ICD-10-CM | POA: Diagnosis present

## 2024-03-10 DIAGNOSIS — R570 Cardiogenic shock: Secondary | ICD-10-CM | POA: Diagnosis not present

## 2024-03-10 DIAGNOSIS — Z955 Presence of coronary angioplasty implant and graft: Secondary | ICD-10-CM | POA: Diagnosis not present

## 2024-03-10 DIAGNOSIS — Z85118 Personal history of other malignant neoplasm of bronchus and lung: Secondary | ICD-10-CM

## 2024-03-10 DIAGNOSIS — D62 Acute posthemorrhagic anemia: Secondary | ICD-10-CM | POA: Diagnosis not present

## 2024-03-10 DIAGNOSIS — I7101 Dissection of ascending aorta: Secondary | ICD-10-CM | POA: Diagnosis not present

## 2024-03-10 DIAGNOSIS — I10 Essential (primary) hypertension: Secondary | ICD-10-CM | POA: Diagnosis not present

## 2024-03-10 DIAGNOSIS — Z01810 Encounter for preprocedural cardiovascular examination: Secondary | ICD-10-CM | POA: Diagnosis not present

## 2024-03-10 DIAGNOSIS — T17590A Other foreign object in bronchus causing asphyxiation, initial encounter: Secondary | ICD-10-CM | POA: Diagnosis not present

## 2024-03-10 DIAGNOSIS — Z880 Allergy status to penicillin: Secondary | ICD-10-CM

## 2024-03-10 DIAGNOSIS — R579 Shock, unspecified: Secondary | ICD-10-CM | POA: Diagnosis not present

## 2024-03-10 DIAGNOSIS — E876 Hypokalemia: Secondary | ICD-10-CM | POA: Diagnosis present

## 2024-03-10 DIAGNOSIS — I5021 Acute systolic (congestive) heart failure: Secondary | ICD-10-CM | POA: Diagnosis not present

## 2024-03-10 DIAGNOSIS — D696 Thrombocytopenia, unspecified: Secondary | ICD-10-CM | POA: Diagnosis not present

## 2024-03-10 DIAGNOSIS — I639 Cerebral infarction, unspecified: Secondary | ICD-10-CM | POA: Diagnosis not present

## 2024-03-10 DIAGNOSIS — R4589 Other symptoms and signs involving emotional state: Secondary | ICD-10-CM | POA: Diagnosis not present

## 2024-03-10 HISTORY — PX: CORONARY BALLOON ANGIOPLASTY: CATH118233

## 2024-03-10 HISTORY — PX: LEFT HEART CATH AND CORONARY ANGIOGRAPHY: CATH118249

## 2024-03-10 LAB — MAGNESIUM: Magnesium: 2 mg/dL (ref 1.7–2.4)

## 2024-03-10 LAB — COMPREHENSIVE METABOLIC PANEL WITH GFR
ALT: 19 U/L (ref 0–44)
AST: 44 U/L — ABNORMAL HIGH (ref 15–41)
Albumin: 4.1 g/dL (ref 3.5–5.0)
Alkaline Phosphatase: 90 U/L (ref 38–126)
Anion gap: 11 (ref 5–15)
BUN: 9 mg/dL (ref 8–23)
CO2: 26 mmol/L (ref 22–32)
Calcium: 10.3 mg/dL (ref 8.9–10.3)
Chloride: 103 mmol/L (ref 98–111)
Creatinine, Ser: 0.68 mg/dL (ref 0.61–1.24)
GFR, Estimated: >60 mL/min (ref >60)
Glucose, Bld: 104 mg/dL — ABNORMAL HIGH (ref 70–99)
Potassium: 3.9 mmol/L (ref 3.5–5.1)
Sodium: 140 mmol/L (ref 135–145)
Total Bilirubin: 1.2 mg/dL (ref 0.0–1.2)
Total Protein: 6.9 g/dL (ref 6.5–8.1)

## 2024-03-10 LAB — CBC WITH DIFFERENTIAL/PLATELET
Abs Immature Granulocytes: 0.04 K/uL (ref 0.00–0.07)
Basophils Absolute: 0.1 K/uL (ref 0.0–0.1)
Basophils Relative: 1 %
Eosinophils Absolute: 0.4 K/uL (ref 0.0–0.5)
Eosinophils Relative: 5 %
HCT: 44.4 % (ref 39.0–52.0)
Hemoglobin: 14.7 g/dL (ref 13.0–17.0)
Immature Granulocytes: 1 %
Lymphocytes Relative: 9 %
Lymphs Abs: 0.8 K/uL (ref 0.7–4.0)
MCH: 31.6 pg (ref 26.0–34.0)
MCHC: 33.1 g/dL (ref 30.0–36.0)
MCV: 95.5 fL (ref 80.0–100.0)
Monocytes Absolute: 0.8 K/uL (ref 0.1–1.0)
Monocytes Relative: 10 %
Neutro Abs: 6.1 K/uL (ref 1.7–7.7)
Neutrophils Relative %: 74 %
Platelets: 193 K/uL (ref 150–400)
RBC: 4.65 MIL/uL (ref 4.22–5.81)
RDW: 12.5 % (ref 11.5–15.5)
WBC: 8.1 K/uL (ref 4.0–10.5)
nRBC: 0 % (ref 0.0–0.2)

## 2024-03-10 LAB — POCT ACTIVATED CLOTTING TIME
Activated Clotting Time: 279 s
Activated Clotting Time: 314 s

## 2024-03-10 LAB — TROPONIN I (HIGH SENSITIVITY)
Troponin I (High Sensitivity): 1469 ng/L (ref <18)
Troponin I (High Sensitivity): 1157 ng/L (ref <18)

## 2024-03-10 LAB — LIPASE, BLOOD: Lipase: 25 U/L (ref 11–51)

## 2024-03-10 SURGERY — LEFT HEART CATH AND CORONARY ANGIOGRAPHY
Anesthesia: LOCAL

## 2024-03-10 MED ORDER — CLOPIDOGREL BISULFATE 75 MG PO TABS: 75.0000 mg | ORAL_TABLET | Freq: Every day | ORAL | Status: DC

## 2024-03-10 MED ORDER — AMLODIPINE BESYLATE 2.5 MG PO TABS: 2.5000 mg | ORAL_TABLET | Freq: Every day | ORAL | Status: DC

## 2024-03-10 MED ORDER — SODIUM CHLORIDE 0.9 % IV SOLN
INTRAVENOUS | Status: DC | PRN
  Administered 2024-03-10: 50 mL/h via INTRAVENOUS

## 2024-03-10 MED ORDER — LIDOCAINE HCL (PF) 1 % IJ SOLN
INTRAMUSCULAR | Status: DC | PRN
  Administered 2024-03-10: 2 mL

## 2024-03-10 MED ORDER — ATORVASTATIN CALCIUM 40 MG PO TABS
40.0000 mg | ORAL_TABLET | Freq: Every day | ORAL | Status: DC
  Filled 2024-03-10: qty 1

## 2024-03-10 MED ORDER — NITROGLYCERIN 1 MG/10 ML FOR IR/CATH LAB
INTRA_ARTERIAL | Status: AC
  Filled 2024-03-10: qty 10

## 2024-03-10 MED ORDER — HEPARIN SODIUM (PORCINE) 1000 UNIT/ML IJ SOLN
INTRAMUSCULAR | Status: AC
  Filled 2024-03-10: qty 10

## 2024-03-10 MED ORDER — CLOPIDOGREL BISULFATE 300 MG PO TABS: 300.0000 mg | ORAL_TABLET | Freq: Once | ORAL | Status: DC

## 2024-03-10 MED ORDER — IOHEXOL 350 MG/ML SOLN
INTRAVENOUS | Status: DC | PRN
  Administered 2024-03-10: 165 mL

## 2024-03-10 MED ORDER — HEPARIN (PORCINE) IN NACL 2-0.9 UNITS/ML
INTRAMUSCULAR | Status: DC | PRN
  Administered 2024-03-10: 10 mL via INTRA_ARTERIAL

## 2024-03-10 MED ORDER — SODIUM CHLORIDE 0.9 % IV SOLN: 250.0000 mL | INTRAVENOUS | Status: AC | PRN

## 2024-03-10 MED ORDER — SODIUM CHLORIDE 0.9 % IV SOLN: INTRAVENOUS | Status: AC

## 2024-03-10 MED ORDER — PRASUGREL HCL 10 MG PO TABS
ORAL_TABLET | ORAL | Status: AC
  Filled 2024-03-10: qty 6

## 2024-03-10 MED ORDER — MIDAZOLAM HCL 2 MG/2ML IJ SOLN
INTRAMUSCULAR | Status: AC
  Filled 2024-03-10: qty 2

## 2024-03-10 MED ORDER — SODIUM CHLORIDE 0.9% FLUSH
3.0000 mL | Freq: Two times a day (BID) | INTRAVENOUS | Status: DC
  Administered 2024-03-11 – 2024-03-16 (×5): 3 mL via INTRAVENOUS

## 2024-03-10 MED ORDER — MELATONIN 5 MG PO TABS
10.0000 mg | ORAL_TABLET | Freq: Every day | ORAL | Status: DC
  Administered 2024-03-11 – 2024-03-16 (×6): 10 mg via ORAL
  Filled 2024-03-10 (×7): qty 2

## 2024-03-10 MED ORDER — HEPARIN BOLUS VIA INFUSION
4000.0000 [IU] | Freq: Once | INTRAVENOUS | Status: AC
  Administered 2024-03-10: 4000 [IU] via INTRAVENOUS
  Filled 2024-03-10: qty 4000

## 2024-03-10 MED ORDER — VERAPAMIL HCL 2.5 MG/ML IV SOLN
INTRAVENOUS | Status: AC
  Filled 2024-03-10: qty 2

## 2024-03-10 MED ORDER — HYDRALAZINE HCL 20 MG/ML IJ SOLN
10.0000 mg | INTRAMUSCULAR | Status: AC | PRN
Start: 2024-03-10 — End: 2024-03-11

## 2024-03-10 MED ORDER — CLOPIDOGREL BISULFATE 75 MG PO TABS: 300.0000 mg | ORAL_TABLET | Freq: Once | ORAL | Status: DC

## 2024-03-10 MED ORDER — NITROGLYCERIN 1 MG/10 ML FOR IR/CATH LAB
INTRA_ARTERIAL | Status: DC | PRN
  Administered 2024-03-10 (×2): 100 ug via INTRACORONARY

## 2024-03-10 MED ORDER — PRASUGREL HCL 10 MG PO TABS
ORAL_TABLET | ORAL | Status: DC | PRN
  Administered 2024-03-10: 60 mg via ORAL

## 2024-03-10 MED ORDER — ACETAMINOPHEN 325 MG PO TABS
650.0000 mg | ORAL_TABLET | ORAL | Status: DC | PRN
Start: 2024-03-10 — End: 2024-03-11

## 2024-03-10 MED ORDER — LIDOCAINE HCL (PF) 1 % IJ SOLN
INTRAMUSCULAR | Status: AC
  Filled 2024-03-10: qty 30

## 2024-03-10 MED ORDER — ONDANSETRON HCL 4 MG/2ML IJ SOLN
4.0000 mg | Freq: Four times a day (QID) | INTRAMUSCULAR | Status: DC | PRN
Start: 2024-03-10 — End: 2024-03-11

## 2024-03-10 MED ORDER — ATORVASTATIN CALCIUM 10 MG PO TABS: 20.0000 mg | ORAL_TABLET | Freq: Every day | ORAL | Status: DC

## 2024-03-10 MED ORDER — FENTANYL CITRATE (PF) 100 MCG/2ML IJ SOLN
INTRAMUSCULAR | Status: AC
Start: 2024-03-10 — End: 2024-03-10
  Filled 2024-03-10: qty 2

## 2024-03-10 MED ORDER — SODIUM CHLORIDE 0.9% FLUSH: 3.0000 mL | INTRAVENOUS | Status: DC | PRN

## 2024-03-10 MED ORDER — MIDAZOLAM HCL 2 MG/2ML IJ SOLN
INTRAMUSCULAR | Status: DC | PRN
  Administered 2024-03-10: 1 mg via INTRAVENOUS

## 2024-03-10 MED ORDER — ASPIRIN 81 MG PO CHEW: 81.0000 mg | CHEWABLE_TABLET | Freq: Every day | ORAL | Status: DC

## 2024-03-10 MED ORDER — HEPARIN (PORCINE) 25000 UT/250ML-% IV SOLN
1100.0000 [IU]/h | INTRAVENOUS | Status: DC
  Administered 2024-03-10: 1100 [IU]/h via INTRAVENOUS
  Filled 2024-03-10: qty 250

## 2024-03-10 MED ORDER — HEPARIN SODIUM (PORCINE) 1000 UNIT/ML IJ SOLN
INTRAMUSCULAR | Status: DC | PRN
  Administered 2024-03-10: 3000 [IU] via INTRAVENOUS
  Administered 2024-03-10 (×2): 5000 [IU] via INTRAVENOUS

## 2024-03-10 MED ORDER — FENTANYL CITRATE (PF) 100 MCG/2ML IJ SOLN
INTRAMUSCULAR | Status: DC | PRN
  Administered 2024-03-10: 25 ug via INTRAVENOUS

## 2024-03-10 MED ORDER — MORPHINE SULFATE (PF) 4 MG/ML IV SOLN
4.0000 mg | Freq: Once | INTRAVENOUS | Status: AC
  Administered 2024-03-10: 4 mg via INTRAVENOUS
  Filled 2024-03-10: qty 1

## 2024-03-10 MED ORDER — PERFLUTREN LIPID MICROSPHERE
1.0000 mL | INTRAVENOUS | Status: AC | PRN
  Administered 2024-03-10: 4 mL via INTRAVENOUS

## 2024-03-10 MED ORDER — LABETALOL HCL 5 MG/ML IV SOLN
10.0000 mg | INTRAVENOUS | Status: AC | PRN
Start: 2024-03-10 — End: 2024-03-11

## 2024-03-10 SURGICAL SUPPLY — 16 items
BALLOON SAPPHIRE 2.5X12 (BALLOONS) IMPLANT
BALLOON SCOREFLEX 2.50X15 (BALLOONS) IMPLANT
BALLOON SCOREFLEX 3.50X15 (BALLOONS) IMPLANT
BALLOON TAKERU 2.0X12 (BALLOONS) IMPLANT
CATH INFINITI AMBI 5FR TG (CATHETERS) IMPLANT
CATH VISTA GUIDE 6FR XBLD 3.5 (CATHETERS) IMPLANT
DEVICE RAD COMP TR BAND LRG (VASCULAR PRODUCTS) IMPLANT
GLIDESHEATH SLEND SS 6F .021 (SHEATH) IMPLANT
GUIDEWIRE INQWIRE 1.5J.035X260 (WIRE) IMPLANT
KIT ENCORE 26 ADVANTAGE (KITS) IMPLANT
KIT SYRINGE INJ CVI SPIKEX1 (MISCELLANEOUS) IMPLANT
PACK CARDIAC CATHETERIZATION (CUSTOM PROCEDURE TRAY) ×1 IMPLANT
SET ATX-X65L (MISCELLANEOUS) IMPLANT
SHEATH PROBE COVER 6X72 (BAG) IMPLANT
WIRE ASAHI PROWATER 180CM (WIRE) IMPLANT
WIRE RUNTHROUGH IZANAI 014 180 (WIRE) IMPLANT

## 2024-03-10 NOTE — ED Notes (Signed)
 Informed RN Geofm pt called out for pain meds.

## 2024-03-10 NOTE — ED Provider Notes (Signed)
[place] EMERGENCY DEPARTMENT AT [facility] Provider Note   CSN: [id number] Arrival date & time: 03/10/24  1156     Patient presents with: Chest Pain   Alan [name] is a 68 y.o. male.  With a history of MI status post LAD DES who presents to the ED for chest pain.  Patient was seen 2 days ago in the ED for chest pain cardiac workup was acutely negative at that time and he was instructed to follow-up with his cardiologist.  Since the time of his discharge he has still been having chest pain.  Localized over the substernal region without radiation to other areas.  Worsened this morning while at rest.  Some associated shortness of breath.  For this reason he was seen at urgent care and directed here for further evaluation given concern for chest pain with new T wave inversions compared to prior EKG.  No nausea vomiting diaphoresis fevers chills or other complaints at this time.  Patient is followed by Dr. Anner with cardiology    Chest Pain      Prior to Admission medications   Medication Sig Start Date End Date Taking? Authorizing Provider  Ascorbic Acid  (VITAMIN C ) 1000 MG tablet Take 2,000 mg by mouth daily.     [provider]  atorvastatin  (LIPITOR) 10 MG tablet Take 1 tablet (10 mg total) by mouth daily. 05/09/23   Anner Alm ORN, MD  Coenzyme Q10 (CO Q 10) 100 MG CAPS Take 100 mg by mouth daily.     [provider]  diazepam  (VALIUM ) 2 MG tablet Take 1 tablet (2 mg total) by mouth every 12 (twelve) hours as needed for up to 12 doses for anxiety. 03/08/24   Cottie Donnice PARAS, MD  famotidine  (PEPCID ) 20 MG tablet Take 1 tablet (20 mg total) by mouth 2 (two) times daily. 03/08/24 03/14/2024  Cottie Donnice PARAS, MD  Hypromellose (HYDROXYPROPYL METHYLCELLULOSE) POWD  07/02/20   [provider]  latanoprost  (XALATAN ) 0.005 % ophthalmic solution Place 1 drop into both eyes every morning. 04/26/23   [provider]  magnesium  30 MG tablet Take 1  tablet by mouth at bedtime.    [provider]  Melatonin 10 MG TABS Take 10 mg by mouth at bedtime.    [provider]  Naltrexone 380 MG SUSR Take 5 mg by mouth 2 (two) times daily. 01/08/24   Augoustides, Marsa, MD  NALTREXONE HCL, PAIN, PO Take 5 mg by mouth in the morning and at bedtime.    [provider]  OVER THE COUNTER MEDICATION Take 4 tablets by mouth in the morning, at noon, and at bedtime.  Relief Factor  from online.    [provider]  Specialty Vitamins Products (PROSTATE PO) Take 1 capsule by mouth daily. PROSTATE FORMULA    [provider]  sucralfate  (CARAFATE ) 1 g tablet Take 1 tablet (1 g total) by mouth 3 (three) times daily before meals. 03/08/24 03/27/2024  Cottie Donnice PARAS, MD  traMADol  (ULTRAM ) 50 MG tablet Take 50 mg by mouth 3 (three) times daily as needed. 10/03/22   [provider]  vitamin B-12 (CYANOCOBALAMIN) 1000 MCG tablet Take 1,000 mcg by mouth daily.    [provider]  Vitamin D-Vitamin K (D3 + K2 DOTS PO) Take 1 tablet by mouth daily.    [provider]    Allergies: A-cillin [ampicillin], Amoxicillin, Other, Penicillins, Sulfa antibiotics, and Crestor  [rosuvastatin ]    Review of Systems  Cardiovascular:  Positive for chest pain.    Updated Vital Signs BP (!) 122/93   Pulse 81   Temp 98.1 F (36.7 C) (Oral)   Resp 16   Ht 6' (1.829 m)   Wt 79 kg   SpO2 100%   BMI 23.62 kg/m   Physical Exam Vitals and nursing note reviewed.  HENT:     Head: Normocephalic and atraumatic.  Eyes:     Pupils: Pupils are equal, round, and reactive to light.  Cardiovascular:     Rate and Rhythm: Normal rate and regular rhythm.  Pulmonary:     Effort: Pulmonary effort is normal.     Breath sounds: Normal breath sounds.  Abdominal:     Palpations: Abdomen is soft.     Tenderness: There is no abdominal tenderness.  Skin:    General: Skin is warm and dry.  Neurological:     Mental  Status: He is alert.  Psychiatric:        Mood and Affect: Mood normal.     (all labs ordered are listed, but only abnormal results are displayed) Labs Reviewed  COMPREHENSIVE METABOLIC PANEL WITH GFR - Abnormal; Notable for the following components:      Result Value   Glucose, Bld 104 (*)    AST 44 (*)    All other components within normal limits  TROPONIN I (HIGH SENSITIVITY) - Abnormal; Notable for the following components:   Troponin I (High Sensitivity) 1,469 (*)    All other components within normal limits  LIPASE, BLOOD  CBC WITH DIFFERENTIAL/PLATELET  MAGNESIUM   HEPARIN  LEVEL (UNFRACTIONATED)  TROPONIN I (HIGH SENSITIVITY)    EKG: EKG Interpretation Date/Time:  Monday March 10 2024 14:03:10 EDT Ventricular Rate:  85 PR Interval:  167 QRS Duration:  95 QT Interval:  418 QTC Calculation: 498 R Axis:   67  Text Interpretation: Sinus rhythm Abnrm T, consider ischemia, anterolateral lds Confirmed by Pamella Sharper [id number]) on 03/10/2024 3:25:43 PM  Radiology: [facility] Chest 2 View Result Date: 03/08/2024 CLINICAL DATA:  cp EXAM: CHEST - 2 VIEW COMPARISON:  07/01/2019 FINDINGS: Pulmonary hyperinflation. Right perihilar fibrosis. No new infiltrate or nodule. Heart size and mediastinal contours are within normal limits. Post valve surgery. No effusion. 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 IMPRESSION: 1. No acute cardiopulmonary disease. 2. Right perihilar fibrosis. Electronically Signed   By: JONETTA Faes M.D.   On: 03/08/2024 16:05     .Critical Care  Performed by: Pamella Sharper LABOR, DO Authorized by: Pamella Sharper LABOR, DO   Critical care provider statement:    Critical care time (minutes):  30   Critical care was necessary to treat or prevent imminent or life-threatening deterioration of the following conditions:  Cardiac failure   Critical care was time spent personally by me on the following activities:  Development of treatment plan with patient or surrogate,  discussions with consultants, evaluation of patient's response to treatment, examination of patient, ordering and review of laboratory studies, ordering and review of radiographic studies, ordering and performing treatments and interventions, pulse oximetry, re-evaluation of patient's condition and review of old charts   I assumed direction of critical care for this patient from another provider in my specialty: no     Care discussed with: admitting provider   Comments:     Discussed with cardiology NP on-call    Medications Ordered in the ED  heparin  ADULT infusion 100 units/mL (25000 units/250mL) (1,100 Units/hr Intravenous New Bag/Given 03/10/24 1458)  morphine  (PF) 4 MG/ML injection 4 mg (4 mg Intravenous Given 03/10/24 1403)  heparin  bolus via infusion 4,000 Units (4,000 Units Intravenous Bolus from Bag 03/10/24 1458)    Clinical Course as of 03/10/24 1526  Mon Mar 10, 2024  1417 Initial troponin of 1469 significantly up from troponins a few days ago.  With new T wave inversions and chest pain this is most concerning for NSTEMI.  Discussed with cardiology who will evaluate patient in the ED and agrees with plan to start heparin  for NSTEMI treatment.  Repeat EKG redemonstrates T wave inversions.  No STEMI. [MP]    Clinical Course User Index [MP] Pamella Ozell LABOR, DO                                 Medical Decision Making 68 year old male with cardiac history as above presents to the ED given concern for chest pain.  5 days of chest pain now with new EKG changes based on EKG taken from urgent care earlier today.  Took aspirin  324 prior to arrival.  No active chest pain on my assessment.  Afebrile normotensive.  Benign physical exam.  Considering extensive cardiac risk factors, recurrent chest pain and EKG changes presentation most concerning for ACS.  Will obtain another delta troponin serial EKGs along with laboratory workup chest x-ray.  Will speak to cardiology once we have some data back  here.  Will continue to monitor on telemetry  Amount and/or Complexity of Data Reviewed Labs: ordered. ECG/medicine tests:  Decision-making details documented in ED Course.  Risk Prescription drug management. Decision regarding hospitalization.        Final diagnoses:  NSTEMI (non-ST elevated myocardial infarction) [facility])    ED Discharge Orders     None          Pamella Ozell LABOR, DO 03/10/24 1526

## 2024-03-10 NOTE — Progress Notes (Signed)
 2230: Removed all air from band. After waiting for about 1-2 minutes, noticed hematoma forming. Began holding pressure. Hematoma size about 4 cm.   2238: Patient states just a little pain while holding manual pressure. Vitals updated. Denied any other complaints.  2350: Total pressure held 10 mins. 4ccs of air replaced in band. Re-educated patient on importance of keeping right hand and wrist still to minimize re bleeding and will be restarting timer for 30 mins before attempting to remove air again.. Patient stated understanding and denied any questions or concerns at this time.

## 2024-03-10 NOTE — Progress Notes (Signed)
 PHARMACY - ANTICOAGULATION CONSULT NOTE  Pharmacy Consult for heparin  Indication: chest pain/ACS  Allergies  Allergen Reactions   A-Cillin [Ampicillin] Shortness Of Breath, Swelling and Rash    Did it involve swelling of the face/tongue/throat, SOB, or low BP? Yes Did it involve sudden or severe rash/hives, skin peeling, or any reaction on the inside of your mouth or nose? Yes Did you need to seek medical attention at a hospital or doctor's office? Yes When did it last happen? ~20 years ago If all above answers are NO, may proceed with cephalosporin use.     Amoxicillin Hives, Shortness Of Breath and Swelling    Did it involve swelling of the face/tongue/throat, SOB, or low BP? Yes Did it involve sudden or severe rash/hives, skin peeling, or any reaction on the inside of your mouth or nose? Yes Did you need to seek medical attention at a hospital or doctor's office? Yes When did it last happen? ~20 years ago If all above answers are NO, may proceed with cephalosporin use.    Other Hives, Shortness Of Breath and Swelling    ALL CILLINS   Penicillins Hives, Shortness Of Breath and Swelling    Did it involve swelling of the face/tongue/throat, SOB, or low BP? Yes Did it involve sudden or severe rash/hives, skin peeling, or any reaction on the inside of your mouth or nose? Yes Did you need to seek medical attention at a hospital or doctor's office? Yes When did it last happen? ~20 years ago If all above answers are NO, may proceed with cephalosporin use.     Sulfa Antibiotics Nausea Only   Crestor  [Rosuvastatin ] Other (See Comments)    G I upset    Patient Measurements: Height: 6' (182.9 cm) Weight: 79 kg (174 lb 2.6 oz) IBW/kg (Calculated) : 77.6 HEPARIN  DW (KG): 79  Vital Signs: Temp: 98.1 F (36.7 C) (07/28 1211) Temp Source: Oral (07/28 1211) BP: 122/93 (07/28 1400) Pulse Rate: 81 (07/28 1400)  Labs: Recent Labs    03/08/24 1502 03/08/24 1702  03/10/24 1215  HGB 12.5*  --  14.7  HCT 37.7*  --  44.4  PLT 158  --  193  CREATININE 0.80  --  0.68  TROPONINIHS 47* 59* 1,469*    Estimated Creatinine Clearance: 98.3 mL/min (by C-G formula based on SCr of 0.68 mg/dL).   Medical History: Past Medical History:  Diagnosis Date   Adenocarcinoma of right lung, stage 3 (HCC) dx'd 03/2019   CAD S/P percutaneous coronary angioplasty 2004   (Ant STEMI) - Prox LAD 100% => 2 overlapping 3.5 x 1.8 mm Cypher DES stents.;  Patent as of August 2015   Dyslipidemia, goal LDL below 70    Essential hypertension    GERD (gastroesophageal reflux disease)    History of Mitral valve prolapse    Moderate - with moderate MR, noted February t 2013   History of radiation therapy    Right Pelvis & left humerus- 12/05/21-12/16/21- Dr. Lynwood Nasuti   History of Severe mitral regurgitation by prior echocardiogram 02/06/2014   TEE: Severe mitral regurgitation with a flail P2 segment and ruptured; Normal LV size & function, dilated LA.   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    Assessment:68 yoM presenting with 3 day history of chest pain found to have elevated troponin ~1500. No oral anticoagulation reported prior to admission. CBC stable. Pharmacy consulted to manage heparin  infusion.   Goal of Therapy:  Heparin  level 0.3-0.7  units/ml Monitor platelets by anticoagulation protocol: Yes   Plan:  Give 4000 units bolus x 1 Start heparin  infusion at 1100 units/hr Check anti-Xa level in 6 hours and daily while on heparin  Continue to monitor H&H and platelets  Koren Or, PharmD Clinical Pharmacist 03/10/2024 2:49 PM Please check AMION for all [facility] Pharmacy numbers\

## 2024-03-10 NOTE — ED Triage Notes (Addendum)
 Pt to ED via EMS from [facility] with c/o substernal CP/upper abdominal pain since Friday. Pt describes pain as pressure. Pt states pain radiates throughout upper chest and lower abdomen. Pt seen here on Friday and discharged with diagnosis of acid reflux. Pt also c/o SOB. Pt breathing even and unlabored. Pt speaking in clear full sentences. 324mg  aspirin  given en route. Pt A&Ox4. Hx MI, SVT, stents.

## 2024-03-10 NOTE — Progress Notes (Signed)
 Echocardiogram 2D Echocardiogram has been performed.  Alan [name] 03/10/2024, 4:35 PM

## 2024-03-10 NOTE — H&P (Signed)
Cardiology H&P   Patient ID: Alan [name] MRN: [id number]; DOB: 1955-09-18   Admission date: 03/10/2024  PCP:  Leonel Cole, MD    [facility] Providers Cardiologist:  [name], MD     Chief Complaint:  Chest pain/NSTEMI  Patient Profile: Alan [name] is a 68 y.o. male with CAD s/p PCI of DES, HTN, HLD, mitral valve prolapse s/p repair, SVT s/p ablation, metastatic non-small lung Ca who is being seen 03/10/2024 for the evaluation of chest pain/NSTEMI.  History of Present Illness: Alan [name] is a 68 yo male with PMH noted above. He is followed by Dr. Clay as an outpatient. Initially treated for an anterior STEMI for occluded pLAD treated with overlapping DES x2. Minimally invasive mitral valve repair with Dr. Dusty in 2015. SVT ablation 10/2019. Diagnosed with non-small cell lung Ca in 2020 with follow up PET scan showing bony mets 10/2021 treated with palliative radiotherapy to the right iliac bone and left humerus. Echo 09/2021 with LVEF of 50-55%, normal RV, s/p MV repair with trivial regurgitation.   Last seen in the office with Dr. Clay on 04/2023, not on BB/ACE/ARB with borderline blood pressures. Only on atorvastatin  10mg , had not been able to tolerate higher doses and was not interested in PCSK9i.   Presented to the ED 7/26 with chest heaviness in the substernal/epigastric region and down into his arms bilaterally. hsTn 47>>59, EKG at that time showed sinus rhythm 91bpm with no acute ST/T wave changes. He was evaluated by overnight cardiology and symptoms felt to be consistent with GERD.   Presented back to the ED on 7/28 with ongoing chest pain. Initially went to UC with symptoms and sent here  Reports he has been experiencing intermittent episodes of chest heaviness for the past 2 weeks with radiation into bilateral arms with both rest and activity.   Labs in the ED showed K+ 140, K+ 3.9, Cr 0.68, Mag 2.0, hsTn 1469, WBC 8.1, Hgb 14.7. EKG shows sinus rhythm  with new TWI in anterolateral leads. Still with chest heaviness, just received morphine . RN is starting IV heparin  currently.   Past Medical History:  Diagnosis Date   Adenocarcinoma of right lung, stage 3 (HCC) dx'd 03/2019   CAD S/P percutaneous coronary angioplasty 2004   (Ant STEMI) - Prox LAD 100% => 2 overlapping 3.5 x 1.8 mm Cypher DES stents.;  Patent as of August 2015   Dyslipidemia, goal LDL below 70    Essential hypertension    GERD (gastroesophageal reflux disease)    History of Mitral valve prolapse    Moderate - with moderate MR, noted February t 2013   History of radiation therapy    Right Pelvis & left humerus- 12/05/21-12/16/21- Dr. Lynwood Nasuti   History of Severe mitral regurgitation by prior echocardiogram 02/06/2014   TEE: Severe mitral regurgitation with a flail P2 segment and ruptured; Normal LV size & function, dilated LA.   Incidental lung nodule, greater than or equal to 8mm 03/16/2014   Ground glass opacity RML noted on CT scan   PONV (postoperative nausea and vomiting)    S/P Minimally Invasive MVR (mitral valve repair) 04/08/2014   Complex valvuloplasty including quadrangular resection of flail segment of posterior leaflet, sliding leaflet plasty, chordal transfer x1, Gore-tex neocord placement x4 and 34 mm Sorin Memo 3D rechord ring annuloplasty via right mini thoracotomy approach   ST elevation myocardial infarction (STEMI) of anterior wall, subsequent episode of care (HCC) 2004   he had a proximal LAD  occlusion treated with 2 overlapping 3.5 x 1.8 mm Cypher DES stents.   Past Surgical History:  Procedure Laterality Date   ANTERIOR CRUCIATE LIGAMENT REPAIR Left 1993   COLONOSCOPY     INTRAOPERATIVE TRANSESOPHAGEAL ECHOCARDIOGRAM N/A 04/08/2014   Procedure: INTRAOPERATIVE TRANSESOPHAGEAL ECHOCARDIOGRAM;  Surgeon: Sudie VEAR Laine, MD;  Location: [facility] OR;  Service: Open Heart Surgery;  Laterality: N/A;   IR RADIOLOGIST EVAL & MGMT  10/04/2022   IR RADIOLOGIST  EVAL & MGMT  11/01/2022   IR RADIOLOGIST EVAL & MGMT  12/06/2022   IR RADIOLOGIST EVAL & MGMT  01/23/2023   LEFT AND RIGHT HEART CATHETERIZATION WITH CORONARY ANGIOGRAM N/A 03/05/2014   Procedure: LEFT AND RIGHT HEART CATHETERIZATION WITH CORONARY ANGIOGRAM;  Surgeon: Alm LELON Clay, MD;  Location: [facility] CATH LAB;  Service: Cardiovascular; (pre-op) Widely patent LAD stents, 40% distal LAD, 30% Cx, ~50% PL-PDA bifurcation lesion    LEFT HEART CATH AND CORONARY ANGIOGRAPHY  2004   In setting of anterior STEMI, found to have 100% % proximal LAD (2 DES Cypher stents)   LEFT HEART CATH AND CORONARY ANGIOGRAPHY  2007   Widely patent LAD stents, 40% distal LAD, 30% Cx, ~50% PL-PDA bifurcation lesion    LEFT HEART CATHETERIZATION WITH CORONARY ANGIOGRAM N/A 09/15/2011   Procedure: LEFT HEART CATHETERIZATION WITH CORONARY ANGIOGRAM;  Surgeon: Dorn JINNY Lesches, MD;  Location: [facility] CATH LAB;  Service: Cardiovascular;  Laterality: N/A;   MITRAL VALVE REPAIR Right 04/08/2014   Procedure: MINIMALLY INVASIVE MITRAL VALVE REPAIR (MVR);  Surgeon: Sudie VEAR Laine, MD;  Location: [facility] OR;  Service: Open Heart Surgery;  Laterality: Right;   NM MYOVIEW  LTD  12/2009   Walk 9 minutes, and 10 METs, diaphragmatic attenuation but no ischemia or infarction.   SVT ABLATION N/A 10/17/2019   Procedure: SVT ABLATION;  Surgeon: Inocencio Soyla Lunger, MD;  Location: [facility] INVASIVE CV LAB;  Service: Cardiovascular;  Laterality: N/A;   TEE WITHOUT CARDIOVERSION N/A 02/06/2014   Procedure: TRANSESOPHAGEAL ECHOCARDIOGRAM (TEE);  Surgeon: Vinie KYM Maxcy, MD;  Normal LV Size U& function - EF 55-60%, no regional WMA.  MV P2 Leaflet is flail with ruptured chord with severe prolapse, anterior leaflet intact.  Severe, eccentric anterior directed MR with dilated LA.   TRANSTHORACIC ECHOCARDIOGRAM  07/18/2018   Normal LV size and function.  EF 50-60 %.  Unable to assess diastolic function.  Mitral valve sewing ring in place.  Mild stenosis with a  gradient of 6 mmHg noted. -   TRANSTHORACIC ECHOCARDIOGRAM  09/30/2021   Stable findings.  EF 50 to 55%.  No RWMA.  Mitral valve stable.  No significant MR.  Normal aortic valve.  Mild aortic dilation, but probably normal for age.   VIDEO BRONCHOSCOPY WITH ENDOBRONCHIAL ULTRASOUND N/A 05/16/2019   Procedure: VIDEO BRONCHOSCOPY WITH ENDOBRONCHIAL ULTRASOUND;  Surgeon: Mannam, Praveen, MD;  Location: [facility] OR;  Service: Pulmonary;  Laterality: N/A;     Medications Prior to Admission: Prior to Admission medications   Medication Sig Start Date End Date Taking? Authorizing Provider  Ascorbic Acid  (VITAMIN C ) 1000 MG tablet Take 2,000 mg by mouth daily.     [provider]  atorvastatin  (LIPITOR) 10 MG tablet Take 1 tablet (10 mg total) by mouth daily. 05/09/23   Clay Alm LELON, MD  Coenzyme Q10 (CO Q 10) 100 MG CAPS Take 100 mg by mouth daily.     [provider]  diazepam  (VALIUM ) 2 MG tablet Take 1 tablet (2 mg total) by mouth every  12 (twelve) hours as needed for up to 12 doses for anxiety. 03/08/24   Cottie Donnice PARAS, MD  famotidine  (PEPCID ) 20 MG tablet Take 1 tablet (20 mg total) by mouth 2 (two) times daily. 03/08/24 04/13/2024  Cottie Donnice PARAS, MD  Hypromellose (HYDROXYPROPYL METHYLCELLULOSE) POWD  07/02/20   [provider]  latanoprost  (XALATAN ) 0.005 % ophthalmic solution Place 1 drop into both eyes every morning. 04/26/23   [provider]  magnesium  30 MG tablet Take 1 tablet by mouth at bedtime.    [provider]  Melatonin 10 MG TABS Take 10 mg by mouth at bedtime.    [provider]  Naltrexone 380 MG SUSR Take 5 mg by mouth 2 (two) times daily. 01/08/24   Augoustides, Marsa, MD  OVER THE COUNTER MEDICATION Take 4 tablets by mouth in the morning, at noon, and at bedtime.  Relief Factor  from online.    [provider]  Specialty Vitamins Products (PROSTATE PO) Take 1 capsule by mouth daily. PROSTATE FORMULA    [provider]  sucralfate  (CARAFATE ) 1 g tablet Take 1 tablet (1 g total) by mouth 3 (three) times daily before meals. 03/08/24 03/22/2024  Cottie Donnice PARAS, MD  traMADol  (ULTRAM ) 50 MG tablet Take 50 mg by mouth 3 (three) times daily as needed. 10/03/22   [provider]  vitamin B-12 (CYANOCOBALAMIN) 1000 MCG tablet Take 1,000 mcg by mouth daily.    [provider]  Vitamin D-Vitamin K (D3 + K2 DOTS PO) Take 1 tablet by mouth daily.    [provider]     Allergies:    Allergies  Allergen Reactions   A-Cillin [Ampicillin] Shortness Of Breath, Swelling and Rash    Did it involve swelling of the face/tongue/throat, SOB, or low BP? Yes Did it involve sudden or severe rash/hives, skin peeling, or any reaction on the inside of your mouth or nose? Yes Did you need to seek medical attention at a hospital or doctor's office? Yes When did it last happen? ~20 years ago If all above answers are NO, may proceed with cephalosporin use.     Amoxicillin Hives, Shortness Of Breath and Swelling    Did it involve swelling of the face/tongue/throat, SOB, or low BP? Yes Did it involve sudden or severe rash/hives, skin peeling, or any reaction on the inside of your mouth or nose? Yes Did you need to seek medical attention at a hospital or doctor's office? Yes When did it last happen? ~20 years ago If all above answers are NO, may proceed with cephalosporin use.    Other Hives, Shortness Of Breath and Swelling    ALL CILLINS   Penicillins Hives, Shortness Of Breath and Swelling    Did it involve swelling of the face/tongue/throat, SOB, or low BP? Yes Did it involve sudden or severe rash/hives, skin peeling, or any reaction on the inside of your mouth or nose? Yes Did you need to seek medical attention at a hospital or doctor's office? Yes When did it last happen? ~20 years ago If all above answers are NO, may proceed with cephalosporin use.     Sulfa Antibiotics Nausea  Only   Crestor  [Rosuvastatin ] Other (See Comments)    G I upset    Social History:   Social History   Socioeconomic History   Marital status: Married    Spouse name: Not on file   Number of children: Not on file   Years of education: Not  on file   Highest education level: Not on file  Occupational History   Not on file  Tobacco Use   Smoking status: Never   Smokeless tobacco: Never  Vaping Use   Vaping status: Never Used  Substance and Sexual Activity   Alcohol use: Yes    Alcohol/week: 10.0 standard drinks of alcohol    Types: 5 Cans of beer, 5 Shots of liquor per week    Comment: social   Drug use: No   Sexual activity: Not on file  Other Topics Concern   Not on file  Social History Narrative   He is a married father of one. Exercises avidly as noted above - runs routinely at least 3 miles 3-4 times a day.  He drinks his Xango fruit juce - 32 Oz. Daily.     Never smoked and only takes occasional alcohol   Social Drivers of Corporate investment banker Strain: Not on file  Food Insecurity: Not on file  Transportation Needs: Not on file  Physical Activity: Not on file  Stress: Not on file  Social Connections: Not on file  Intimate Partner Violence: Not on file     Family History:   The patient's family history includes Cancer in his paternal grandmother; Heart Problems in his father; Hypertension in his mother.    ROS:  Please see the history of present illness.  All other ROS reviewed and negative.     Physical Exam/Data: Vitals:   03/10/24 1211 03/10/24 1212 03/10/24 1400  BP: (!) 134/97  (!) 122/93  Pulse: 85  81  Resp: 14  16  Temp: 98.1 F (36.7 C)    TempSrc: Oral    SpO2: 100%  100%  Weight:  79 kg   Height:  6' (1.829 m)    No intake or output data in the 24 hours ending 03/10/24 1519    03/10/2024   12:12 PM 03/08/2024    2:56 PM 01/02/2024    1:42 PM  Last 3 Weights  Weight (lbs) 174 lb 2.6 oz 175 lb 170 lb 8 oz  Weight (kg) 79 kg 79.379  kg 77.338 kg     Body mass index is 23.62 kg/m.  General:  Well nourished, well developed, in no acute distress HEENT: normal Neck: no JVD Vascular: No carotid bruits; Distal pulses 2+ bilaterally   Cardiac:  normal S1, S2; RRR; no murmur  Lungs:  clear to auscultation bilaterally, no wheezing, rhonchi or rales  Abd: soft, nontender, no hepatomegaly  Ext: no edema Musculoskeletal:  No deformities, BUE and BLE strength normal and equal Skin: warm and dry  Neuro:  CNs 2-12 intact, no focal abnormalities noted Psych:  Normal affect   EKG:  The ECG that was done 03/10/2024 was personally reviewed and demonstrates sinus rhythm with new TWI in anterolateral leads  Relevant CV Studies:  Echo: 09/2021  IMPRESSIONS     1. Left ventricular ejection fraction, by estimation, is 50 to 55%. The  left ventricle has low normal function. Left ventricular diastolic  parameters are indeterminate.   2. Right ventricular systolic function is normal. The right ventricular  size is normal. There is normal pulmonary artery systolic pressure.   3. S/p MV repair and placement of 34 mm Sorin Memo 3D annuloplasty ring,  04/08/14). . The mitral valve has been repaired/replaced. Trivial mitral  valve regurgitation.   4. The aortic valve is tricuspid. Aortic valve regurgitation is not  visualized.   5. Aortic dilatation  noted. There is mild dilatation of the aortic root,  measuring 43 mm. There is mild dilatation of the ascending aorta,  measuring 38 mm.   6. The inferior vena cava is normal in size with greater than 50%  respiratory variability, suggesting right atrial pressure of 3 mmHg.   FINDINGS   Left Ventricle: Left ventricular ejection fraction, by estimation, is 50  to 55%. The left ventricle has low normal function. The left ventricular  internal cavity size was normal in size. There is no left ventricular  hypertrophy. Left ventricular  diastolic parameters are indeterminate.   Right  Ventricle: The right ventricular size is normal. Right vetricular  wall thickness was not assessed. Right ventricular systolic function is  normal. There is normal pulmonary artery systolic pressure. The tricuspid  regurgitant velocity is 1.94 m/s,  and with an assumed right atrial pressure of 3 mmHg, the estimated right  ventricular systolic pressure is 18.1 mmHg.   Left Atrium: Left atrial size was normal in size.   Right Atrium: Right atrial size was normal in size.   Pericardium: There is no evidence of pericardial effusion.   Mitral Valve: S/p MV repair and placement of 34 mm Sorin Memo 3D  annuloplasty ring, 04/08/14). The mitral valve has been repaired/replaced.  There is mild calcification of the mitral valve leaflet(s). Trivial mitral  valve regurgitation. MV peak gradient,  8.8 mmHg. The mean mitral valve gradient is 3.7 mmHg.   Tricuspid Valve: The tricuspid valve is normal in structure. Tricuspid  valve regurgitation is not demonstrated.   Aortic Valve: The aortic valve is tricuspid. Aortic valve regurgitation is  not visualized.   Pulmonic Valve: The pulmonic valve was not well visualized. Pulmonic valve  regurgitation is trivial.   Aorta: Aortic dilatation noted. There is mild dilatation of the aortic  root, measuring 43 mm. There is mild dilatation of the ascending aorta,  measuring 38 mm.   Venous: The inferior vena cava is normal in size with greater than 50%  respiratory variability, suggesting right atrial pressure of 3 mmHg.   IAS/Shunts: No atrial level shunt detected by color flow Doppler.    Laboratory Data: High Sensitivity Troponin:   Recent Labs  Lab 03/08/24 1502 03/08/24 1702 03/10/24 1215  TROPONINIHS 47* 59* 1,469*      Chemistry Recent Labs  Lab 03/08/24 1502 03/10/24 1215 03/10/24 1227  NA 138 140  --   K 3.6 3.9  --   CL 109 103  --   CO2 22 26  --   GLUCOSE 141* 104*  --   BUN 14 9  --   CREATININE 0.80 0.68  --   CALCIUM   8.9 10.3  --   MG  --   --  2.0  GFRNONAA >60 >60  --   ANIONGAP 7 11  --     Recent Labs  Lab 03/10/24 1215  PROT 6.9  ALBUMIN  4.1  AST 44*  ALT 19  ALKPHOS 90  BILITOT 1.2   Lipids No results for input(s): CHOL, TRIG, HDL, LABVLDL, LDLCALC, CHOLHDL in the last 168 hours. Hematology Recent Labs  Lab 03/08/24 1502 03/10/24 1215  WBC 6.0 8.1  RBC 3.89* 4.65  HGB 12.5* 14.7  HCT 37.7* 44.4  MCV 96.9 95.5  MCH 32.1 31.6  MCHC 33.2 33.1  RDW 12.6 12.5  PLT 158 193   Thyroid  No results for input(s): TSH, FREET4 in the last 168 hours. BNPNo results for input(s): BNP, PROBNP in the last  168 hours.  DDimer No results for input(s): DDIMER in the last 168 hours.  Radiology/Studies:  No results found.   Assessment and Plan:  Alan [name] is a 68 y.o. male with CAD s/p PCI of DES, HTN, HLD, mitral valve prolapse s/p repair, SVT s/p ablation, metastatic non-small lung Ca who is being seen 03/10/2024 for the evaluation of chest pain/NSTEMI.  NSTEMI CAD s/p prior stenting x2 to LAD -- presenting back with intermittent chest heaviness for the past couple of weeks. Seen 7/26 with low flat troponin of 40>50, today 1469. EKG today shows new anterolateral lead TWI -- still with ongoing chest heaviness at the time of exam -- IV heparin  started, add ASA and increase statin  -- plan for cardiac cath today -- check echo  Informed Consent   Shared Decision Making/Informed Consent The risks [stroke (1 in 1000), death (1 in 1000), kidney failure [usually temporary] (1 in 500), bleeding (1 in 200), allergic reaction [possibly serious] (1 in 200)], benefits (diagnostic support and management of coronary artery disease) and alternatives of a cardiac catheterization were discussed in detail with Alan [name] and he is willing to proceed.    HTN -- recent notes indicate BPs have been borderline, currently not on any meds  Mitral valve prolapse s/p repair '15 -- done  by Dr. Dusty -- no significany murmur noted on exam, check echo as above  HLD -- has been on atorvastatin  10mg  daily, but reports he ok with increasing  -- add atorvastatin  80mg  daily  SVT  -- s/p ablation '21  Metastatic Lung Ca -- follows with oncology outpatient, mets to bone 2023 but has been stable on most recent scans  Risk Assessment/Risk Scores:  TIMI Risk Score for Unstable Angina or Non-ST Elevation MI:   The patient's TIMI risk score is  , which indicates a  % risk of all cause mortality, new or recurrent myocardial infarction or need for urgent revascularization in the next 14 days.     Code Status: Full Code  Severity of Illness: The appropriate patient status for this patient is INPATIENT. Inpatient status is judged to be reasonable and necessary in order to provide the required intensity of service to ensure the patient's safety. The patient's presenting symptoms, physical exam findings, and initial radiographic and laboratory data in the context of their chronic comorbidities is felt to place them at high risk for further clinical deterioration. Furthermore, it is not anticipated that the patient will be medically stable for discharge from the hospital within 2 midnights of admission.   * I certify that at the point of admission it is my clinical judgment that the patient will require inpatient hospital care spanning beyond 2 midnights from the point of admission due to high intensity of service, high risk for further deterioration and high frequency of surveillance required.*  For questions or updates, please contact [facility] Please consult [email or web address] for contact info under     Signed, Manuelita Rummer, NP  03/10/2024 3:19 PM

## 2024-03-10 NOTE — ED Notes (Signed)
 CCMD was called and informed of the move

## 2024-03-10 NOTE — ED Notes (Signed)
 MD Pamella notified of troponin of 1469.

## 2024-03-10 NOTE — Telephone Encounter (Signed)
 Wife Sarah) called to report patient is being sent by ambulance to the ED from Urgent Care due to chest pains.  Wife noted patient's EKG showed heart damage.

## 2024-03-10 NOTE — Interval H&P Note (Signed)
 History and Physical Interval Note:  03/10/2024 6:00 PM  Alan [name]  has presented today for surgery, with the diagnosis of nstemi.  The various methods of treatment have been discussed with the patient and family. After consideration of risks, benefits and other options for treatment, the patient has consented to  Procedure(s): LEFT HEART CATH AND CORONARY ANGIOGRAPHY (N/A)  PERCUTANEOUS CORONARY INTERVENTION   as a surgical intervention.  The patient's history has been reviewed, patient examined, no change in status, stable for surgery.  I have reviewed the patient's chart and labs.  Questions were answered to the patient's satisfaction.     Cath Lab Visit (complete for each Cath Lab visit)  Clinical Evaluation Leading to the Procedure:   ACS: Yes.    Non-ACS:    Anginal Classification: CCS IV  Anti-ischemic medical therapy: Minimal Therapy (1 class of medications)  Non-Invasive Test Results: No non-invasive testing performed  Prior CABG: No previous CABG     Alan [name]

## 2024-03-11 ENCOUNTER — Encounter (HOSPITAL_COMMUNITY): Payer: Self-pay | Admitting: Cardiology

## 2024-03-11 ENCOUNTER — Other Ambulatory Visit (HOSPITAL_COMMUNITY)

## 2024-03-11 ENCOUNTER — Other Ambulatory Visit (HOSPITAL_COMMUNITY): Payer: Self-pay

## 2024-03-11 ENCOUNTER — Telehealth (HOSPITAL_COMMUNITY): Payer: Self-pay | Admitting: Pharmacy Technician

## 2024-03-11 ENCOUNTER — Inpatient Hospital Stay (HOSPITAL_COMMUNITY)

## 2024-03-11 DIAGNOSIS — I5020 Unspecified systolic (congestive) heart failure: Secondary | ICD-10-CM

## 2024-03-11 DIAGNOSIS — Z955 Presence of coronary angioplasty implant and graft: Secondary | ICD-10-CM

## 2024-03-11 DIAGNOSIS — E785 Hyperlipidemia, unspecified: Secondary | ICD-10-CM

## 2024-03-11 DIAGNOSIS — I110 Hypertensive heart disease with heart failure: Secondary | ICD-10-CM

## 2024-03-11 DIAGNOSIS — C7801 Secondary malignant neoplasm of right lung: Secondary | ICD-10-CM

## 2024-03-11 DIAGNOSIS — R079 Chest pain, unspecified: Secondary | ICD-10-CM | POA: Diagnosis not present

## 2024-03-11 DIAGNOSIS — I2510 Atherosclerotic heart disease of native coronary artery without angina pectoris: Secondary | ICD-10-CM

## 2024-03-11 DIAGNOSIS — Z9889 Other specified postprocedural states: Secondary | ICD-10-CM

## 2024-03-11 DIAGNOSIS — I214 Non-ST elevation (NSTEMI) myocardial infarction: Secondary | ICD-10-CM

## 2024-03-11 DIAGNOSIS — Z952 Presence of prosthetic heart valve: Secondary | ICD-10-CM

## 2024-03-11 LAB — CBC
HCT: 37.6 % — ABNORMAL LOW (ref 39.0–52.0)
Hemoglobin: 13 g/dL (ref 13.0–17.0)
MCH: 32.4 pg (ref 26.0–34.0)
MCHC: 34.6 g/dL (ref 30.0–36.0)
MCV: 93.8 fL (ref 80.0–100.0)
Platelets: 157 K/uL (ref 150–400)
RBC: 4.01 MIL/uL — ABNORMAL LOW (ref 4.22–5.81)
RDW: 12.6 % (ref 11.5–15.5)
WBC: 6.9 K/uL (ref 4.0–10.5)
nRBC: 0 % (ref 0.0–0.2)

## 2024-03-11 LAB — ECHOCARDIOGRAM LIMITED
Calc EF: 32.7 %
Height: 76 in
Single Plane A2C EF: 29.9 %
Single Plane A4C EF: 35.9 %
Weight: 2786.61 [oz_av]

## 2024-03-11 LAB — HEPARIN LEVEL (UNFRACTIONATED)
Heparin Unfractionated: 0.2 [IU]/mL — ABNORMAL LOW (ref 0.30–0.70)
Heparin Unfractionated: 0.25 [IU]/mL — ABNORMAL LOW (ref 0.30–0.70)

## 2024-03-11 LAB — BASIC METABOLIC PANEL WITH GFR
Anion gap: 11 (ref 5–15)
BUN: 12 mg/dL (ref 8–23)
CO2: 23 mmol/L (ref 22–32)
Calcium: 9.2 mg/dL (ref 8.9–10.3)
Chloride: 107 mmol/L (ref 98–111)
Creatinine, Ser: 0.75 mg/dL (ref 0.61–1.24)
GFR, Estimated: >60 mL/min (ref >60)
Glucose, Bld: 149 mg/dL — ABNORMAL HIGH (ref 70–99)
Potassium: 3.3 mmol/L — ABNORMAL LOW (ref 3.5–5.1)
Sodium: 141 mmol/L (ref 135–145)

## 2024-03-11 LAB — ECHOCARDIOGRAM COMPLETE
AV Peak grad: 2 mmHg
Ao pk vel: 0.72 m/s
Area-P 1/2: 3.27 cm2
Height: 72 in
Weight: 2786.61 [oz_av]

## 2024-03-11 MED ORDER — ASPIRIN 81 MG PO TBEC
81.0000 mg | DELAYED_RELEASE_TABLET | Freq: Every day | ORAL | Status: DC
  Administered 2024-03-11 – 2024-03-16 (×6): 81 mg via ORAL
  Filled 2024-03-11 (×6): qty 1

## 2024-03-11 MED ORDER — ONDANSETRON HCL 4 MG/2ML IJ SOLN
4.0000 mg | Freq: Four times a day (QID) | INTRAMUSCULAR | Status: DC | PRN
Start: 2024-03-11 — End: 2024-03-17

## 2024-03-11 MED ORDER — ATORVASTATIN CALCIUM 80 MG PO TABS
80.0000 mg | ORAL_TABLET | Freq: Every day | ORAL | Status: DC
  Administered 2024-03-11 – 2024-03-18 (×7): 80 mg via ORAL
  Filled 2024-03-11 (×7): qty 1

## 2024-03-11 MED ORDER — HEPARIN (PORCINE) 25000 UT/250ML-% IV SOLN
1400.0000 [IU]/h | INTRAVENOUS | Status: DC
  Administered 2024-03-11: 1100 [IU]/h via INTRAVENOUS
  Administered 2024-03-11: 1250 [IU]/h via INTRAVENOUS
  Administered 2024-03-12: 1400 [IU]/h via INTRAVENOUS
  Administered 2024-03-13 – 2024-03-14 (×2): 1350 [IU]/h via INTRAVENOUS
  Administered 2024-03-15 – 2024-03-16 (×3): 1400 [IU]/h via INTRAVENOUS
  Filled 2024-03-11 (×9): qty 250

## 2024-03-11 MED ORDER — PERFLUTREN LIPID MICROSPHERE
1.0000 mL | INTRAVENOUS | Status: AC | PRN
  Administered 2024-03-11: 5 mL via INTRAVENOUS

## 2024-03-11 MED ORDER — ASPIRIN 300 MG RE SUPP: 300.0000 mg | RECTAL | Status: DC

## 2024-03-11 MED ORDER — NITROGLYCERIN 0.4 MG SL SUBL: 0.4000 mg | SUBLINGUAL_TABLET | SUBLINGUAL | Status: DC | PRN

## 2024-03-11 MED ORDER — POTASSIUM CHLORIDE CRYS ER 20 MEQ PO TBCR
60.0000 meq | EXTENDED_RELEASE_TABLET | Freq: Once | ORAL | Status: AC
  Administered 2024-03-11: 60 meq via ORAL
  Filled 2024-03-11: qty 3

## 2024-03-11 MED ORDER — SODIUM CHLORIDE 0.9 % IV SOLN: INTRAVENOUS | Status: DC

## 2024-03-11 MED ORDER — ACETAMINOPHEN 325 MG PO TABS
650.0000 mg | ORAL_TABLET | ORAL | Status: DC | PRN
Start: 2024-03-11 — End: 2024-03-17

## 2024-03-11 MED ORDER — SODIUM CHLORIDE 0.9 % IV SOLN
0.7500 ug/kg/min | INTRAVENOUS | Status: DC
  Administered 2024-03-11 – 2024-03-16 (×9): 0.75 ug/kg/min via INTRAVENOUS
  Filled 2024-03-11 (×12): qty 50

## 2024-03-11 MED ORDER — ASPIRIN 81 MG PO CHEW: 324.0000 mg | CHEWABLE_TABLET | ORAL | Status: DC

## 2024-03-11 NOTE — Telephone Encounter (Signed)
 Patient Product/process development scientist completed.    The patient is insured through [employer]. Patient has Medicare and is not eligible for a copay card, but may be able to apply for patient assistance or Medicare RX Payment Plan (Patient Must reach out to their plan, if eligible for payment plan), if available.    Ran test claim for Entresto 24-26 mg and the current 30 day co-pay is $45.00.  Ran test claim for Farxiga 10 mg and the current 30 day co-pay is $30.00.  Ran test claim for Jardiance 10 mg and the current 30 day co-pay is $30.00.  This test claim was processed through [facility]- copay amounts may vary at other pharmacies due to pharmacy/plan contracts, or as the patient moves through the different stages of their insurance plan.     Reyes Sharps, CPHT Pharmacy Technician III Certified Patient Advocate [facility] Patient Advocate Team Direct Number: [phone]  Fax: [phone]

## 2024-03-11 NOTE — H&P (View-Only) (Signed)
 [address]       [place] [address]             [phone]        Alan [name] [facility] Medical Record #[id number] Date of Birth: 1956-04-25  Referring: No ref. provider found Primary Care: Leonel Cole, MD Primary Cardiologist:David Anner, MD  Chief Complaint:    Chief Complaint  Patient presents with   Chest Pain    History of Present Illness:     Alan [name] is a 68 year old male with a past medical history of CAD (s/p STEMI with PCI and DES to LAD 2004) followed by Dr. Anner, HTN, HLD, mitral valve prolapse (s/p mini mitral valve repair utilizing a 34mm Sorin Memo 3D Reochord ring annuloplasty by Dr. Dusty 2015), SVT (s/p ablation 2021), and metastatic lung cancer (s/p palliative radiotherapy, now stable). The patient presented to the ED on 07/26 due to intermittent chest pain over the last week, he reports it started last Monday but he thought it was muscle soreness due to him starting to lift weights. The night of 07/25 he had chest pain that was evaluated by EMS but eventually resolved so he was not transported to the ED. On 07/26 he noted centralized sharp substernal chest pain will dull radiation to the rest of his body and numbness of bilateral arms to his elbows. He denies nausea and vomiting, shortness of breath, diaphoresis, and LOC. Nitroglycerin  provided some relief. He presented to the [facility] ED on 07/26 but was discharged home since troponin levels were flat and he had atypical chest discomfort. The chest pain again worsened on 07/28 so he presented to urgent care and was directed to [facility] ED due to EKG with new T wave inversions. Troponin I (high sensitivity) peaked at 1469, he was ruled in for NSTEMI. Cardiac catheterization 03/10/24 showed 99% LAD in stent restenosis with 60% residual stenosis after balloon angioplasty, mid LAD 80% stenosis with residual 50% stenosis after balloon angioplasty, questionable ostial left  circumflex 60-70% stenosis, RCA with ulcerated plaque and focal concentric heavily calcified 70% stenosis and the remainder of the RCA with mild diffuse disease. He was started on Cangrelor  and heparin  post PCI. The patient was given Effient  on 07/28 for cardiac catheterization and later started on Plavix  but it does not look like he was given a dose of Plavix , this has been discontinued. Echocardiogram on 07/28 showed LVEF 30-35%, left ventricle with moderately decreased function, mildly reduced right ventricular systolic function, mitral valve repair with trivial mitral valve regurgitation, and trivial aortic valve regurgitation no other valvular abnormalities noted.   The patient lives with his wife in a 2 story home but the primary bedroom is on the first floor. He is retired but previously worked in [employer] traffic [employer]. His last workout was 1 week ago and he overall remains active. He reports his cancer has remained stable for the last few years and he continues to follow up with oncology with CT scans. He does report he has developed some depression since the cancer diagnosis and treatment and was started on a medication recently but developed the chest pain soon after starting the medication so he stopped it and does not remember the name. The patient reports he is worried about going back on a heart lung machine.    Current Activity/ Functional Status: Patient is independent with mobility/ambulation, transfers, ADL's, IADL's.   Zubrod Score: At the time of  surgery this patient's most appropriate activity status/level should be described as: []     0    Normal activity, no symptoms [x]     1    Restricted in physical strenuous activity but ambulatory, able to do out light work []     2    Ambulatory and capable of self care, unable to do work activities, up and about                 more than 50%  Of the time                            []     3    Only limited self care, in bed greater than 50%  of waking hours []     4    Completely disabled, no self care, confined to bed or chair []     5    Moribund  Past Medical History:  Diagnosis Date   Adenocarcinoma of right lung, stage 3 (HCC) dx'd 03/2019   CAD S/P percutaneous coronary angioplasty 2004   (Ant STEMI) - Prox LAD 100% => 2 overlapping 3.5 x 1.8 mm Cypher DES stents.;  Patent as of August 2015   Dyslipidemia, goal LDL below 70    Essential hypertension    GERD (gastroesophageal reflux disease)    History of Mitral valve prolapse    Moderate - with moderate MR, noted February t 2013   History of radiation therapy    Right Pelvis & left humerus- 12/05/21-12/16/21- Dr. Lynwood Nasuti   History of Severe mitral regurgitation by prior echocardiogram 02/06/2014   TEE: Severe mitral regurgitation with a flail P2 segment and ruptured; Normal LV size & function, dilated LA.   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    Past Surgical History:  Procedure Laterality Date   ANTERIOR CRUCIATE LIGAMENT REPAIR Left 1993   COLONOSCOPY     CORONARY BALLOON ANGIOPLASTY N/A 03/10/2024   Procedure: CORONARY BALLOON ANGIOPLASTY;  Surgeon: Anner Alm ORN, MD;  Location: [facility] INVASIVE CV LAB;  Service: Cardiovascular;  Laterality: N/A;   INTRAOPERATIVE TRANSESOPHAGEAL ECHOCARDIOGRAM N/A 04/08/2014   Procedure: INTRAOPERATIVE  TRANSESOPHAGEAL ECHOCARDIOGRAM;  Surgeon: Sudie VEAR Laine, MD;  Location: [facility] OR;  Service: Open Heart Surgery;  Laterality: N/A;   IR RADIOLOGIST EVAL & MGMT  10/04/2022   IR RADIOLOGIST EVAL & MGMT  11/01/2022   IR RADIOLOGIST EVAL & MGMT  12/06/2022   IR RADIOLOGIST EVAL & MGMT  01/23/2023   LEFT AND RIGHT HEART CATHETERIZATION WITH CORONARY ANGIOGRAM N/A 03/05/2014   Procedure: LEFT AND RIGHT HEART CATHETERIZATION WITH CORONARY ANGIOGRAM;  Surgeon: Alm ORN Anner, MD;  Location: [facility] CATH LAB;  Service: Cardiovascular; (pre-op) Widely patent LAD stents, 40% distal LAD, 30% Cx, ~50% PL-PDA bifurcation lesion    LEFT HEART CATH AND CORONARY ANGIOGRAPHY  2004  In setting of anterior STEMI, found to have 100% % proximal LAD (2 DES Cypher stents)   LEFT HEART CATH AND CORONARY ANGIOGRAPHY  2007   Widely patent LAD stents, 40% distal LAD, 30% Cx, ~50% PL-PDA bifurcation lesion    LEFT HEART CATH AND CORONARY ANGIOGRAPHY N/A 03/10/2024   Procedure: LEFT HEART CATH AND CORONARY ANGIOGRAPHY;  Surgeon: Anner Alm ORN, MD;  Location: [facility] INVASIVE CV LAB;  Service: Cardiovascular;  Laterality: N/A;   LEFT HEART CATHETERIZATION WITH CORONARY ANGIOGRAM N/A 09/15/2011   Procedure: LEFT HEART CATHETERIZATION WITH CORONARY ANGIOGRAM;  Surgeon: Dorn JINNY Lesches, MD;  Location: [facility] CATH LAB;  Service: Cardiovascular;  Laterality: N/A;   MITRAL VALVE REPAIR Right 04/08/2014   Procedure: MINIMALLY INVASIVE MITRAL VALVE REPAIR (MVR);  Surgeon: Sudie VEAR Laine, MD;  Location: [facility] OR;  Service: Open Heart Surgery;  Laterality: Right;   NM MYOVIEW  LTD  12/2009   Walk 9 minutes, and 10 METs, diaphragmatic attenuation but no ischemia or infarction.   SVT ABLATION N/A 10/17/2019   Procedure: SVT ABLATION;  Surgeon: Inocencio Soyla Lunger, MD;  Location: [facility] INVASIVE CV LAB;  Service: Cardiovascular;  Laterality: N/A;   TEE WITHOUT CARDIOVERSION N/A 02/06/2014   Procedure: TRANSESOPHAGEAL ECHOCARDIOGRAM (TEE);  Surgeon: Vinie KYM Maxcy, MD;  Normal LV Size U& function - EF 55-60%, no regional WMA.  MV P2 Leaflet is flail with ruptured chord with severe prolapse, anterior leaflet intact.  Severe, eccentric anterior directed MR with dilated LA.   TRANSTHORACIC ECHOCARDIOGRAM  07/18/2018   Normal LV size and function.  EF 50-60 %.  Unable to assess diastolic function.  Mitral valve sewing ring in place.  Mild stenosis with a gradient of 6 mmHg noted. -   TRANSTHORACIC ECHOCARDIOGRAM  09/30/2021   Stable findings.  EF 50 to 55%.  No RWMA.  Mitral valve stable.  No significant MR.  Normal aortic valve.  Mild aortic dilation, but probably normal for age.   VIDEO BRONCHOSCOPY WITH ENDOBRONCHIAL ULTRASOUND N/A 05/16/2019   Procedure: VIDEO BRONCHOSCOPY WITH ENDOBRONCHIAL ULTRASOUND;  Surgeon: Mannam, Praveen, MD;  Location: [facility] OR;  Service: Pulmonary;  Laterality: N/A;    Social History   Tobacco Use  Smoking Status Never  Smokeless Tobacco Never    Social History   Substance and Sexual Activity  Alcohol Use Yes   Alcohol/week: 10.0 standard drinks of alcohol   Types: 5 Cans of beer, 5 Shots of liquor per week   Comment: social     Allergies  Allergen Reactions   A-Cillin [Ampicillin] Shortness Of Breath, Swelling and Rash    Did it involve swelling of the face/tongue/throat, SOB, or low BP? Yes Did it involve sudden or severe rash/hives, skin peeling, or any reaction on the inside of your mouth or nose? Yes Did you need to seek medical attention at a hospital or doctor's office? Yes When did it last happen? ~20 years ago If all above answers are NO, may proceed with cephalosporin use.     Amoxicillin Hives, Shortness Of Breath and Swelling    Did it involve swelling of the face/tongue/throat, SOB, or low BP? Yes Did it involve sudden or severe rash/hives, skin peeling, or any reaction on the inside of your mouth or nose? Yes Did you need to seek medical attention at a hospital or doctor's office? Yes When  did it last happen? ~20 years ago If all above answers are NO, may proceed with cephalosporin use.    Other Hives, Shortness Of  Breath and Swelling    ALL CILLINS   Penicillins Hives, Shortness Of Breath and Swelling    Did it involve swelling of the face/tongue/throat, SOB, or low BP? Yes Did it involve sudden or severe rash/hives, skin peeling, or any reaction on the inside of your mouth or nose? Yes Did you need to seek medical attention at a hospital or doctor's office? Yes When did it last happen? ~20 years ago If all above answers are NO, may proceed with cephalosporin use.     Sulfa Antibiotics Nausea Only   Crestor  [Rosuvastatin ] Other (See Comments)    G I upset    Current Facility-Administered Medications  Medication Dose Route Frequency Provider Last Rate Last Admin   0.9 %  sodium chloride  infusion  250 mL Intravenous PRN Anner Alm ORN, MD       0.9 %  sodium chloride  infusion   Intravenous Continuous Nishan, Peter C, MD       acetaminophen  (TYLENOL ) tablet 650 mg  650 mg Oral Q4H PRN Henry Manuelita NOVAK, NP       aspirin  EC tablet 81 mg  81 mg Oral Daily Henry Manuelita B, NP       atorvastatin  (LIPITOR) tablet 80 mg  80 mg Oral Daily Henry Manuelita B, NP       cangrelor  (KENGREAL ) 50 mg in sodium chloride  0.9 % 250 mL (0.2 mg/mL) infusion  0.75 mcg/kg/min Intravenous Continuous Nishan, Peter C, MD       heparin  ADULT infusion 100 units/mL (25000 units/250mL)  1,100 Units/hr Intravenous Continuous Nishan, Peter C, MD 11 mL/hr at 03/11/24 0833 1,100 Units/hr at 03/11/24 9166   melatonin tablet 10 mg  10 mg Oral QHS Anner Alm ORN, MD   10 mg at 03/11/24 9795   nitroGLYCERIN  (NITROSTAT ) SL tablet 0.4 mg  0.4 mg Sublingual Q5 Min x 3 PRN Henry Manuelita NOVAK, NP       ondansetron  (ZOFRAN ) injection 4 mg  4 mg Intravenous Q6H PRN Roberts, Lindsay B, NP       sodium chloride  flush (NS) 0.9 % injection 3 mL  3 mL Intravenous Q12H Anner Alm ORN, MD   3 mL at 03/11/24  0205   sodium chloride  flush (NS) 0.9 % injection 3 mL  3 mL Intravenous PRN Anner Alm ORN, MD        Medications Prior to Admission  Medication Sig Dispense Refill Last Dose/Taking   Ascorbic Acid  (VITAMIN C ) 1000 MG tablet Take 2,000 mg by mouth daily.    03/09/2024 Morning   atorvastatin  (LIPITOR) 10 MG tablet Take 1 tablet (10 mg total) by mouth daily. 90 tablet 3 03/09/2024 Morning   Coenzyme Q10 (CO Q 10) 100 MG CAPS Take 100 mg by mouth daily.    03/09/2024 Morning   diazepam  (VALIUM ) 2 MG tablet Take 1 tablet (2 mg total) by mouth every 12 (twelve) hours as needed for up to 12 doses for anxiety. 12 tablet 0 Past Month   famotidine  (PEPCID ) 20 MG tablet Take 1 tablet (20 mg total) by mouth 2 (two) times daily. 60 tablet 0 03/09/2024 Evening   latanoprost  (XALATAN ) 0.005 % ophthalmic solution Place 1 drop into both eyes every morning.   03/09/2024 Morning   magnesium  30 MG tablet Take 1 tablet by mouth at bedtime.   03/09/2024 Bedtime   Melatonin 10 MG TABS Take 10 mg by mouth at bedtime.   03/09/2024 Bedtime   NALTREXONE HCL, PAIN, PO Take 5 mg by mouth  daily as needed (for pain).   03/09/2024 Morning   OVER THE COUNTER MEDICATION Take 4 tablets by mouth in the morning, at noon, and at bedtime.  Relief Factor  from online.   03/09/2024 Morning   Specialty Vitamins Products (PROSTATE PO) Take 1 capsule by mouth daily. PROSTATE FORMULA   03/09/2024 Morning   sucralfate  (CARAFATE ) 1 g tablet Take 1 tablet (1 g total) by mouth 3 (three) times daily before meals. 90 tablet 0 03/09/2024 Evening   traMADol  (ULTRAM ) 50 MG tablet Take 50 mg by mouth 3 (three) times daily as needed for moderate pain (pain score 4-6).   Past Month   vitamin B-12 (CYANOCOBALAMIN) 1000 MCG tablet Take 1,000 mcg by mouth daily.   03/09/2024 Morning   Vitamin D-Vitamin K (D3 + K2 DOTS PO) Take 1 tablet by mouth daily.   03/09/2024 Morning    Family History  Problem Relation Age of Onset   Hypertension Mother    Heart  Problems Father        triple bypass 1989   Cancer Paternal Grandmother        Pancreatic     Review of Systems:   Review of Systems  Constitutional:  Positive for malaise/fatigue and weight loss.       Lost about 20lbs over the last month  HENT:  Negative for hearing loss.   Eyes:  Negative for blurred vision.  Respiratory:  Negative for cough, shortness of breath and wheezing.        Hoarseness over the last month  Cardiovascular:  Positive for chest pain. Negative for palpitations and leg swelling.  Gastrointestinal:  Positive for abdominal pain. Negative for constipation, diarrhea, heartburn, nausea and vomiting.  Genitourinary:  Negative for dysuria.  Musculoskeletal:  Negative for falls and myalgias.  Skin:  Negative for rash.  Neurological:  Positive for dizziness. Negative for weakness and headaches.       Lightheadedness  Endo/Heme/Allergies:  Does not bruise/bleed easily.  Psychiatric/Behavioral:  Positive for depression. The patient is nervous/anxious.   Dentist every 6 months does not take antibiotics  Physical Exam: BP (!) 81/63 (BP Location: Right Arm) Comment: MD made aware  Pulse 92   Temp 97.7 F (36.5 C) (Oral)   Resp 16   Ht 6' 4 (1.93 m)   Wt 79 kg   SpO2 100%   BMI 21.20 kg/m   General appearance: alert, cooperative, and no distress Head: Normocephalic, without obvious abnormality, atraumatic Neck: no adenopathy, no carotid bruit, no JVD, supple, symmetrical, trachea midline, and thyroid  not enlarged, symmetric, no tenderness/mass/nodules Lymph nodes: Cervical, supraclavicular, and axillary nodes normal. Resp: clear to auscultation bilaterally Cardio: regular rate and rhythm, S1, S2 normal, no murmur, click, rub or gallop GI: soft, non-tender; bowel sounds normal; no masses,  no organomegaly Extremities: extremities normal, atraumatic, no cyanosis or edema Neurologic: Grossly normal  Diagnostic Studies & Radiology Findings:  LEFT HEART CATH AND  CORONARY ANGIOGRAPHY  CORONARY BALLOON ANGIOPLASTY     Previously placed Ost LAD to Prox LAD stent is 99% in-stent restenosis (with TIMI I-0 flow distally).   Scoring balloon angioplasty was performed using a BALLOON SCOREFLEX 3.50X15.  Post intervention, there is a 60% residual stenosis.  TIMI-3 flow restored   ------------------------------------------   Mid LAD to Dist LAD lesion is 80% stenosed.  TIMI 0-I flow.   Balloon angioplasty was performed using a BALLOON TAKERU 2.0X12. Post intervention, there is a 50% residual stenosis.  TIMI-3 flow distally restored distally   ------------------------------------------  Prox RCA-1 lesion is 45% stenosed. Prox RCA-2 lesion is 70% stenosed.   Ost Cx lesion is 40% stenosed.   ------------------------------------------   LV end diastolic pressure is normal.   There is no aortic valve stenosis.   Diagnostic    Dominance: Right                                                             Intervention  Multivessel disease: Likely culprit is 99% eccentric fibrotic/calcified ISR of ostial-proximal LAD stent from 2004-2005 with TIMI 0 flow to the apex, also noted focal mid LAD 80% stenosis just prior to small diagonal branch Difficult PTCA of ISR starting with a 2.0 mm balloon upsizing to 2.5 mm flex followed by 3.5 mm flex inflated to high atmospheres reducing the stenosis to ~50 to 60%, and PTCA only of the mid LAD 80% stenosis using a 2.0 mm balloon reducing to 50% stenosis. Questionable ostial LCx-in some images appears to be widely patent another images appear to be at least 60 to 70% stenosed.  (Will need IVUS or FFR wire assessment).  The main to the LCx is free of any disease has a small OM1 branch and then terminates as a LPL 1. RCA with apparent ulcerated plaque at a small proximal branch followed by a focal concentric heavily calcified 70% stenosis.  The remainder of the RCA has mild diffuse disease and terminates as a large near wraparound PDA  with 2 major PL branches and 1 minor branch.  Mild diffuse disease. Normal LVEDP     ECHOCARDIOGRAM REPORT       Patient Name:   Alan [name] Date of Exam: 03/10/2024 Medical Rec #:  [id number]        Height:       72.0 in Accession #:    [id number]       Weight:       174.2 lb Date of Birth:  09/04/1955         BSA:          2.009 m Patient Age:    68 years         BP:           122/93 mmHg Patient Gender: M                HR:           83 bpm. Exam Location:  Inpatient  Procedure: 2D Echo, Cardiac Doppler, Color Doppler and Intracardiac            Opacification Agent (Both Spectral and Color Flow Doppler were            utilized during procedure).  Indications:    Myocardial Infact I21.9   History:        Patient has prior history of Echocardiogram examinations, most                 recent 09/28/2021. Acute MI and CAD, Arrythmias:Tachycardia; Risk                 Factors:Hypertension and Dyslipidemia. MV repair Dusty                 04/08/2014-Complex Valvular Repair utilizing a 34 mm Sorin Memo  3D Reochord Ring Annuloplasty                 -Quadrangular resection with sliding plasty of the posterior                 leaflet (p2)                 -Suspension of Neochords x 4                 -Chordal Transfer.                   Mitral Valve: 34 Sorin Memo 3D Reochord Ring prosthetic                 annuloplasty ring valve is present in the mitral position.                 Procedure Date: 04/08/2014.   Sonographer:    Thea Norlander RCS Referring Phys: MAUDE JAYSON EMMER    Sonographer Comments: Technically difficult study due to poor echo windows and no parasternal window. Attempted Definity  contrast. IMPRESSIONS    1. Left ventricular ejection fraction, by estimation, is 30 to 35%. The left ventricle has moderately decreased function. The left ventricle demonstrates regional wall motion abnormalities (see scoring diagram/findings for description). Left  ventricular  diastolic parameters are indeterminate.  2. LV anteroseptal, mid anterior, and apical akinesis.  3. Right ventricular systolic function is mildly reduced. The right ventricular size is normal. Tricuspid regurgitation signal is inadequate for assessing PA pressure.  4. The mitral valve has been repaired/replaced. Trivial mitral valve regurgitation. The mean mitral valve gradient is 4.0 mmHg with average heart rate of 83 bpm. There is a 34 Sorin Memo 3D Reochord Ring prosthetic annuloplasty ring present in the mitral position. Procedure Date: 04/08/2014. Echo findings are consistent with normal structure and function of the mitral valve prosthesis.  5. The aortic valve was not well visualized. Aortic valve regurgitation is trivial.  6. The inferior vena cava is normal in size with greater than 50% respiratory variability, suggesting right atrial pressure of 3 mmHg.  FINDINGS  Left Ventricle: Left ventricular ejection fraction, by estimation, is 30 to 35%. The left ventricle has moderately decreased function. The left ventricle demonstrates regional wall motion abnormalities. Definity  contrast agent was given IV to delineate the left ventricular endocardial borders. The left ventricular internal cavity size was normal in size. Suboptimal image quality limits for assessment of left ventricular hypertrophy. Left ventricular diastolic parameters are indeterminate.  Right Ventricle: The right ventricular size is normal. No increase in right ventricular wall thickness. Right ventricular systolic function is mildly reduced. Tricuspid regurgitation signal is inadequate for assessing PA pressure.  Left Atrium: Left atrial size was normal in size.  Right Atrium: Right atrial size was normal in size.  Pericardium: There is no evidence of pericardial effusion.  Mitral Valve: The mitral valve has been repaired/replaced. Trivial mitral valve regurgitation. There is a 34 Sorin Memo  3D Reochord Ring prosthetic annuloplasty ring present in the mitral position. Procedure Date: 04/08/2014. Echo findings are consistent with normal structure and function of the mitral valve prosthesis. MV peak gradient, 9.7 mmHg. The mean mitral valve gradient is 4.0 mmHg with average heart rate of 83 bpm.  Tricuspid Valve: The tricuspid valve is grossly normal. Tricuspid valve regurgitation is not demonstrated. No evidence of tricuspid stenosis.  Aortic Valve: The aortic valve was not well visualized. Aortic valve regurgitation is trivial. Aortic valve peak  gradient measures 2.0 mmHg.  Pulmonic Valve: The pulmonic valve was not well visualized. Pulmonic valve regurgitation is not visualized.  Aorta: The aortic root was not well visualized.  Venous: The inferior vena cava is normal in size with greater than 50% respiratory variability, suggesting right atrial pressure of 3 mmHg.  IAS/Shunts: The atrial septum is grossly normal.      Diastology LV e' medial:    5.87 cm/s LV E/e' medial:  13.9 LV e' lateral:   6.31 cm/s LV E/e' lateral: 12.9    RIGHT VENTRICLE             IVC RV S prime:     10.00 cm/s  IVC diam: 1.70 cm TAPSE (M-mode): 1.4 cm  LEFT ATRIUM             Index        RIGHT ATRIUM           Index LA Vol (A2C):   23.3 ml 11.60 ml/m  RA Area:     10.20 cm LA Vol (A4C):   18.8 ml 9.36 ml/m   RA Volume:   16.70 ml  8.31 ml/m LA Biplane Vol: 21.6 ml 10.75 ml/m  AORTIC VALVE AV Vmax:      71.50 cm/s AV Peak Grad: 2.0 mmHg LVOT Vmax:    62.20 cm/s LVOT Vmean:   39.900 cm/s LVOT VTI:     0.099 m  MITRAL VALVE MV Area (PHT): 3.27 cm     SHUNTS MV Peak grad:  9.7 mmHg     Systemic VTI: 0.10 m MV Mean grad:  4.0 mmHg MV Vmax:       1.56 m/s MV Vmean:      92.7 cm/s MV Decel Time: 232 msec MV E velocity: 81.40 cm/s MV A velocity: 146.00 cm/s MV E/A ratio:  0.56  Soyla Merck MD Electronically signed by Soyla Merck MD Signature Date/Time:  03/11/2024/12:03:58 AM       Final        Assessment & Plan: CAD s/p LAD DES 2004: Now s/p balloon angioplasty with residual stenosis. Patient would benefit from CABG surgery but history of mini mitral valve repair and metastatic lung cancer s/p radiotherapy will make this patient higher risk as well as EF 30-35%. Patient also received a dose of Effient  yesterday, now on Cangrelor  and Heparin . Dr. Kerrin to review and ultimately determine surgical candidacy and timing. Tentatively plan for Monday 08/04 after Effient  washout.  Mitral valve prolapse S/P mini mitral valve repair: Done in 2015 by Dr. Dusty, trivial mitral valve regurgitation but no other valvular abnormalities on echo. Patient reports he goes to the dentist every 6 months but he does not take antibiotics prior to dental visits.  HTN HLD Metastatic Lung cancer: Non small cell lung cancer (originated from right lung) diagnosed in 2020 with small bony mets in 2023 treated with palliative radiotherapy to the right iliac bone and left humerus. Follows with oncology, has remained stable on recent scans.  SVT: s/p ablation 2021  Con GORMAN Bend, PA-C 03/11/24 I spent approximately 32 minutes face to face with the patient   Patient seen and examined, records and cath images reviewed.  68 yo man with history of CAD, stents, MV repair via mini-thoracotomy, ablation, hypertension, hyperlipidemia and metastatic lung cancer.  Presents with unstable CP and r/i for non-STEMI.  Echo showed trivial MR and EF 30%.  Cath showed severe 3 vessel CAD with 99% stenosis of LAD with TIMI 1 flow-  reopened with residual 50% stenosis and TIMI 3 flow.  CABG indicated for relief of symptoms.  Also survival benefit as it relates to CAD.  Metastatic lung cancer- currently under observation with no known active disease.  I discussed the general nature of the procedure, including the need for general anesthesia, the incisions to be used, the use of  cardiopulmonary bypass, and the use of temporary pacemaker wires and drainage tubes postoperatively with Mr. Chriscoe.  We discussed the expected hospital stay, overall recovery and short and long term outcomes. I informed him of the indications, risks, benefits and alternatives.   He understands the risks include, but are not limited to death, stroke, MI, DVT/PE, bleeding, possible need for transfusion, infections, cardiac arrhythmias, as well as other organ system dysfunction including respiratory, renal, or GI complications.    Higher than normal risk with redo, EF 30% and prior cardiac surgery.  He wishes to proceed.  Need to wait for effient  washout.  Elspeth MOTE Kerrin, MD [facility] [phone]

## 2024-03-11 NOTE — Progress Notes (Signed)
 PHARMACY - ANTICOAGULATION Pharmacy Consult for heparin  Indication: multivessel CAD   Allergies  Allergen Reactions   A-Cillin [Ampicillin] Shortness Of Breath, Swelling and Rash    Did it involve swelling of the face/tongue/throat, SOB, or low BP? Yes Did it involve sudden or severe rash/hives, skin peeling, or any reaction on the inside of your mouth or nose? Yes Did you need to seek medical attention at a hospital or doctor's office? Yes When did it last happen? ~20 years ago If all above answers are NO, may proceed with cephalosporin use.     Amoxicillin Hives, Shortness Of Breath and Swelling    Did it involve swelling of the face/tongue/throat, SOB, or low BP? Yes Did it involve sudden or severe rash/hives, skin peeling, or any reaction on the inside of your mouth or nose? Yes Did you need to seek medical attention at a hospital or doctor's office? Yes When did it last happen? ~20 years ago If all above answers are NO, may proceed with cephalosporin use.    Other Hives, Shortness Of Breath and Swelling    ALL CILLINS   Penicillins Hives, Shortness Of Breath and Swelling    Did it involve swelling of the face/tongue/throat, SOB, or low BP? Yes Did it involve sudden or severe rash/hives, skin peeling, or any reaction on the inside of your mouth or nose? Yes Did you need to seek medical attention at a hospital or doctor's office? Yes When did it last happen? ~20 years ago If all above answers are NO, may proceed with cephalosporin use.     Sulfa Antibiotics Nausea Only   Crestor  [Rosuvastatin ] Other (See Comments)    G I upset    Patient Measurements: Height: 6' 4 (193 cm) Weight: 79 kg (174 lb 2.6 oz) IBW/kg (Calculated) : 86.8 HEPARIN  DW (KG): 79  Vital Signs: Temp: 98 F (36.7 C) (07/29 1212) Temp Source: Oral (07/29 1212) BP: 92/66 (07/29 1212) Pulse Rate: 85 (07/29 1212)  Labs: Recent Labs    03/08/24 1502 03/08/24 1702 03/10/24 1215  03/10/24 1415 03/11/24 0422 03/11/24 1140  HGB 12.5*  --  14.7  --  13.0  --   HCT 37.7*  --  44.4  --  37.6*  --   PLT 158  --  193  --  157  --   HEPARINUNFRC  --   --   --   --   --  0.20*  CREATININE 0.80  --  0.68  --  0.75  --   TROPONINIHS 47* 59* 1,469* 1,157*  --   --     Estimated Creatinine Clearance: 100.1 mL/min (by C-G formula based on SCr of 0.75 mg/dL).   Assessment: 68 y.o. male with CAD s/p PTCA, awaiting possible PCI or CABG, on heparin  and cangrelor  -heparin  level= 0.2 on heparin  1100 units/hr  Goal of Therapy:  Heparin  level = 0.3-0.5 (lower goal to to concomitant cangrelor ) Monitor platelets by anticoagulation protocol: Yes   Plan:  -Increase heparin  to 1250 units/hr -Heparin  level in 8 hours and daily wth CBC daily  Prentice Poisson, PharmD Clinical Pharmacist **Pharmacist phone directory can now be found on [email or web address] ([email or web address]).  Listed under [facility].

## 2024-03-11 NOTE — Progress Notes (Signed)
 This morning patient found to be hypotensive by the CNA. BP 75/49 (58). CNA made RN aware.   RN reassessed BP and found right arm BP to read 88/65 (73) and left arm BP to read 81/63 (69). RN discussed findings with MD. Plan to hold Amlodipine  dose.  Patient is A&Ox4, easily aroused, and participates in care. Patient ambulated via stand-by assist to toilet. Patient denies SOB and chest pain.

## 2024-03-11 NOTE — Progress Notes (Signed)
 PHARMACY - ANTICOAGULATION Pharmacy Consult for heparin  Indication: multivessel CAD   Allergies  Allergen Reactions   A-Cillin [Ampicillin] Shortness Of Breath, Swelling and Rash    Did it involve swelling of the face/tongue/throat, SOB, or low BP? Yes Did it involve sudden or severe rash/hives, skin peeling, or any reaction on the inside of your mouth or nose? Yes Did you need to seek medical attention at a hospital or doctor's office? Yes When did it last happen? ~20 years ago If all above answers are NO, may proceed with cephalosporin use.     Amoxicillin Hives, Shortness Of Breath and Swelling    Did it involve swelling of the face/tongue/throat, SOB, or low BP? Yes Did it involve sudden or severe rash/hives, skin peeling, or any reaction on the inside of your mouth or nose? Yes Did you need to seek medical attention at a hospital or doctor's office? Yes When did it last happen? ~20 years ago If all above answers are NO, may proceed with cephalosporin use.    Other Hives, Shortness Of Breath and Swelling    ALL CILLINS   Penicillins Hives, Shortness Of Breath and Swelling    Did it involve swelling of the face/tongue/throat, SOB, or low BP? Yes Did it involve sudden or severe rash/hives, skin peeling, or any reaction on the inside of your mouth or nose? Yes Did you need to seek medical attention at a hospital or doctor's office? Yes When did it last happen? ~20 years ago If all above answers are NO, may proceed with cephalosporin use.     Sulfa Antibiotics Nausea Only   Crestor  [Rosuvastatin ] Other (See Comments)    G I upset    Patient Measurements: Height: 6' 4 (193 cm) Weight: 79 kg (174 lb 2.6 oz) IBW/kg (Calculated) : 86.8 HEPARIN  DW (KG): 79  Vital Signs: Temp: 98.3 F (36.8 C) (07/29 0141) Temp Source: Oral (07/29 0141) BP: 101/83 (07/29 0141) Pulse Rate: 102 (07/29 0141)  Labs: Recent Labs    03/08/24 1502 03/08/24 1702 03/10/24 1215  03/10/24 1415  HGB 12.5*  --  14.7  --   HCT 37.7*  --  44.4  --   PLT 158  --  193  --   CREATININE 0.80  --  0.68  --   TROPONINIHS 47* 59* 1,469* 1,157*    Estimated Creatinine Clearance: 100.1 mL/min (by C-G formula based on SCr of 0.68 mg/dL).   Assessment: 68 y.o. male with CAD s/p PTCA, awaiting possible PCI or CABG, for heparin    Goal of Therapy:  Heparin  level 0.3-0.7 units/ml Monitor platelets by anticoagulation protocol: Yes   Plan:  Start heparin  1100 units/hr at 0400 Check heparin  level in 8 hours.   Cathlyn Arrant, PharmD, BCPS

## 2024-03-11 NOTE — Progress Notes (Signed)
 Cardiology :  Alan [name]  Subjective:  Denies SSCP, palpitations or Dyspnea BP running low   Objective:  Vitals:   03/11/24 0329 03/11/24 0804 03/11/24 0808 03/11/24 0811  BP: (!) 81/64 (!) 75/49 (!) 88/64 (!) 81/63  Pulse: 90 94 (!) 101 92  Resp: 16 16 16 16   Temp: 97.9 F (36.6 C) 97.7 F (36.5 C)    TempSrc: Oral Oral    SpO2: 93% 99% 99% 100%  Weight:      Height:        Intake/Output from previous day:  Intake/Output Summary (Last 24 hours) at 03/11/2024 0838 Last data filed at 03/11/2024 9166 Gross per 24 hour  Intake 375.36 ml  Output 400 ml  Net -24.64 ml    Physical Exam: MS clear Extremities cool Lungs clear Right radial A No murmur prior right mini thoracotomy No edema  Lab Results: Basic Metabolic Panel: Recent Labs    03/10/24 1215 03/10/24 1227 03/11/24 0422  NA 140  --  141  K 3.9  --  3.3*  CL 103  --  107  CO2 26  --  23  GLUCOSE 104*  --  149*  BUN 9  --  12  CREATININE 0.68  --  0.75  CALCIUM  10.3  --  9.2  MG  --  2.0  --    Liver Function Tests: Recent Labs    03/10/24 1215  AST 44*  ALT 19  ALKPHOS 90  BILITOT 1.2  PROT 6.9  ALBUMIN  4.1   Recent Labs    03/10/24 1215  LIPASE 25   CBC: Recent Labs    03/10/24 1215 03/11/24 0422  WBC 8.1 6.9  NEUTROABS 6.1  --   HGB 14.7 13.0  HCT 44.4 37.6*  MCV 95.5 93.8  PLT 193 157     Imaging: ECHOCARDIOGRAM COMPLETE Result Date: 03/11/2024    ECHOCARDIOGRAM REPORT   Patient Name:   Alan [name] Date of Exam: 03/10/2024 Medical Rec #:  [id number]        Height:       72.0 in Accession #:    [id number]       Weight:       174.2 lb Date of Birth:  1955/08/25         BSA:          2.009 m Patient Age:    68 years         BP:           122/93 mmHg Patient Gender: M                HR:           83 bpm. Exam Location:  Inpatient Procedure: 2D Echo, Cardiac Doppler, Color Doppler and Intracardiac            Opacification Agent (Both Spectral and Color Flow Doppler were             utilized during procedure). 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 Acute MI and CAD, Arrythmias:Tachycardia; Risk                 Factors:Hypertension and Dyslipidemia. MV repair Dusty  04/08/2014-Complex Valvular Repair utilizing a 34 mm Sorin Memo                 3D Reochord Ring Annuloplasty                 -Quadrangular resection with sliding plasty of the posterior                 leaflet (p2)                 -Suspension of Neochords x 4                 -Chordal Transfer.                  Mitral Valve: 34 Sorin Memo 3D Reochord Ring prosthetic                 annuloplasty ring valve is present in the mitral position.                 Procedure Date: 04/08/2014.  Sonographer:    Thea Norlander RCS Referring Phys: MAUDE JAYSON EMMER  Sonographer Comments: Technically difficult study due to poor echo windows and no parasternal window. Attempted Definity  contrast. IMPRESSIONS  1. Left ventricular ejection fraction, by estimation, is 30 to 35%. The left ventricle has moderately decreased function. The left ventricle demonstrates regional wall motion abnormalities (see scoring diagram/findings for description). Left ventricular  diastolic parameters are indeterminate.  2. LV anteroseptal, mid anterior, and apical akinesis.  3. Right ventricular systolic function is mildly reduced. The right ventricular size is normal. Tricuspid regurgitation signal is inadequate for assessing PA pressure.  4. The mitral valve has been repaired/replaced. Trivial mitral valve regurgitation. The mean mitral valve gradient is 4.0 mmHg with average heart rate of 83 bpm. There is a 34 Sorin Memo 3D Reochord Ring prosthetic annuloplasty ring present in the mitral position. Procedure Date: 04/08/2014. Echo findings are consistent with normal structure and function of the mitral valve prosthesis.  5. The aortic valve was  not well visualized. Aortic valve regurgitation is trivial.  6. The inferior vena cava is normal in size with greater than 50% respiratory variability, suggesting right atrial pressure of 3 mmHg. FINDINGS  Left Ventricle: Left ventricular ejection fraction, by estimation, is 30 to 35%. The left ventricle has moderately decreased function. The left ventricle demonstrates regional wall motion abnormalities. Definity  contrast agent was given IV to delineate the left ventricular endocardial borders. The left ventricular internal cavity size was normal in size. Suboptimal image quality limits for assessment of left ventricular hypertrophy. Left ventricular diastolic parameters are indeterminate. Right Ventricle: The right ventricular size is normal. No increase in right ventricular wall thickness. Right ventricular systolic function is mildly reduced. Tricuspid regurgitation signal is inadequate for assessing PA pressure. Left Atrium: Left atrial size was normal in size. Right Atrium: Right atrial size was normal in size. Pericardium: There is no evidence of pericardial effusion. Mitral Valve: The mitral valve has been repaired/replaced. Trivial mitral valve regurgitation. There is a 34 Sorin Memo 3D Reochord Ring prosthetic annuloplasty ring present in the mitral position. Procedure Date: 04/08/2014. Echo findings are consistent with normal structure and function of the mitral valve prosthesis. MV peak gradient, 9.7 mmHg. The mean mitral valve gradient is 4.0 mmHg with average heart rate of 83 bpm. Tricuspid Valve: The tricuspid valve is grossly normal. Tricuspid valve regurgitation is not demonstrated. No evidence of tricuspid stenosis. Aortic Valve: The aortic valve was not  well visualized. Aortic valve regurgitation is trivial. Aortic valve peak gradient measures 2.0 mmHg. Pulmonic Valve: The pulmonic valve was not well visualized. Pulmonic valve regurgitation is not visualized. Aorta: The aortic root was not well  visualized. Venous: The inferior vena cava is normal in size with greater than 50% respiratory variability, suggesting right atrial pressure of 3 mmHg. IAS/Shunts: The atrial septum is grossly normal.   Diastology LV e' medial:    5.87 cm/s LV E/e' medial:  13.9 LV e' lateral:   6.31 cm/s LV E/e' lateral: 12.9  RIGHT VENTRICLE             IVC RV S prime:     10.00 cm/s  IVC diam: 1.70 cm TAPSE (M-mode): 1.4 cm LEFT ATRIUM             Index        RIGHT ATRIUM           Index LA Vol (A2C):   23.3 ml 11.60 ml/m  RA Area:     10.20 cm LA Vol (A4C):   18.8 ml 9.36 ml/m   RA Volume:   16.70 ml  8.31 ml/m LA Biplane Vol: 21.6 ml 10.75 ml/m  AORTIC VALVE AV Vmax:      71.50 cm/s AV Peak Grad: 2.0 mmHg LVOT Vmax:    62.20 cm/s LVOT Vmean:   39.900 cm/s LVOT VTI:     0.099 m MITRAL VALVE MV Area (PHT): 3.27 cm     SHUNTS MV Peak grad:  9.7 mmHg     Systemic VTI: 0.10 m MV Mean grad:  4.0 mmHg MV Vmax:       1.56 m/s MV Vmean:      92.7 cm/s MV Decel Time: 232 msec MV E velocity: 81.40 cm/s MV A velocity: 146.00 cm/s MV E/A ratio:  0.56 Soyla Merck MD Electronically signed by Soyla Merck MD Signature Date/Time: 03/11/2024/12:03:58 AM    Final    CARDIAC CATHETERIZATION Result Date: 03/10/2024 Images from the original result were not included.   Previously placed Ost LAD to Prox LAD stent is 99% in-stent restenosis (with TIMI I-0 flow distally).   Scoring balloon angioplasty was performed using a BALLOON SCOREFLEX 3.50X15.  Post intervention, there is a 60% residual stenosis.  TIMI-3 flow restored   ------------------------------------------   Mid LAD to Dist LAD lesion is 80% stenosed.  TIMI 0-I flow.   Balloon angioplasty was performed using a BALLOON TAKERU 2.0X12. Post intervention, there is a 50% residual stenosis.  TIMI-3 flow distally restored distally   ------------------------------------------   Prox RCA-1 lesion is 45% stenosed. Prox RCA-2 lesion is 70% stenosed.   Ost Cx lesion is 40% stenosed.    ------------------------------------------   LV end diastolic pressure is normal.   There is no aortic valve stenosis. Diagnostic Dominance: Right      Intervention Multivessel disease: Likely culprit is 99% eccentric fibrotic/calcified ISR of ostial-proximal LAD stent from 2004-2005 with TIMI 0 flow to the apex, also noted focal mid LAD 80% stenosis just prior to small diagonal branch Difficult PTCA of ISR starting with a 2.0 mm balloon upsizing to 2.5 mm flex followed by 3.5 mm flex inflated to high atmospheres reducing the stenosis to ~50 to 60%, and PTCA only of the mid LAD 80% stenosis using a 2.0 mm balloon reducing to 50% stenosis. Questionable ostial LCx-in some images appears to be widely patent another images appear to be at least 60 to 70% stenosed.  (Will need IVUS  or FFR wire assessment).  The main to the LCx is free of any disease has a small OM1 branch and then terminates as a LPL 1. RCA with apparent ulcerated plaque at a small proximal branch followed by a focal concentric heavily calcified 70% stenosis.  The remainder of the RCA has mild diffuse disease and terminates as a large near wraparound PDA with 2 major PL branches and 1 minor branch.  Mild diffuse disease. Normal LVEDP RECOMMENDATIONS   Anticipated discharge date to be determined.   Plan is to review case with interventional colleagues to determine best course of action: Based on the fact that the LAD ISR PTCA was suboptimal and will likely require potentially shockwave lithotripsy and high-pressure balloon angioplasty with likely new stent placement that would be ostial, question if there is a benefit for pausing with PTCA only and considering CVTS consultation.   He was loaded with prasugrel  in the Cath Lab, however in order to minimize the wait time if CABG is considered, we will convert to Plavix  for now pending decision about CVTS consultation or proceeding with PCI [name], MD   Cardiac Studies:  ECG:    Telemetry:  NSR    Echo: EF 30-35% normal appearing mitral repair   Medications:    aspirin  EC  81 mg Oral Daily   atorvastatin   40 mg Oral Daily   atorvastatin   80 mg Oral Daily   melatonin  10 mg Oral QHS   sodium chloride  flush  3 mL Intravenous Q12H      sodium chloride      cangrelor  (KENGREAL ) 50 mg in sodium chloride  0.9 % 250 mL (0.2 mg/mL) infusion     heparin  1,100 Units/hr (03/11/24 9166)    Assessment/Plan:   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 Sub optimal result Also had ostial LCX dx and >70% proximal RCA plaque. Given 3 VD with sub-optimal LAD result and low EF favor CABG as best subsequent intervention. He received effient  last night. He had PCI so needs antiplatelet Will continue heparin  and start cangrelor . I spoke with Dr Kerrin from CVTS He or one of his partners will see patient but CABG not likely to be done till next week if possible. I don't think MRI needed to look at anterior wall viability as he would benefit from bypass of RCA/LCX as well. BP is low this am Will give small fluid bolus and hold norvasc . Continue ASA and statin Hct mildly decreased this am at 37.6.  Maude Emmer 03/11/2024, 8:38 AM

## 2024-03-11 NOTE — Consult Note (Addendum)
 [address]       [place] [address]             [phone]        Alan [name] [facility] Medical Record #[id number] Date of Birth: 1956-04-25  Referring: No ref. provider found Primary Care: Leonel Cole, MD Primary Cardiologist:David Anner, MD  Chief Complaint:    Chief Complaint  Patient presents with   Chest Pain    History of Present Illness:     Alan [name] is a 68 year old male with a past medical history of CAD (s/p STEMI with PCI and DES to LAD 2004) followed by Dr. Anner, HTN, HLD, mitral valve prolapse (s/p mini mitral valve repair utilizing a 34mm Sorin Memo 3D Reochord ring annuloplasty by Dr. Dusty 2015), SVT (s/p ablation 2021), and metastatic lung cancer (s/p palliative radiotherapy, now stable). The patient presented to the ED on 07/26 due to intermittent chest pain over the last week, he reports it started last Monday but he thought it was muscle soreness due to him starting to lift weights. The night of 07/25 he had chest pain that was evaluated by EMS but eventually resolved so he was not transported to the ED. On 07/26 he noted centralized sharp substernal chest pain will dull radiation to the rest of his body and numbness of bilateral arms to his elbows. He denies nausea and vomiting, shortness of breath, diaphoresis, and LOC. Nitroglycerin  provided some relief. He presented to the [facility] ED on 07/26 but was discharged home since troponin levels were flat and he had atypical chest discomfort. The chest pain again worsened on 07/28 so he presented to urgent care and was directed to [facility] ED due to EKG with new T wave inversions. Troponin I (high sensitivity) peaked at 1469, he was ruled in for NSTEMI. Cardiac catheterization 03/10/24 showed 99% LAD in stent restenosis with 60% residual stenosis after balloon angioplasty, mid LAD 80% stenosis with residual 50% stenosis after balloon angioplasty, questionable ostial left  circumflex 60-70% stenosis, RCA with ulcerated plaque and focal concentric heavily calcified 70% stenosis and the remainder of the RCA with mild diffuse disease. He was started on Cangrelor  and heparin  post PCI. The patient was given Effient  on 07/28 for cardiac catheterization and later started on Plavix  but it does not look like he was given a dose of Plavix , this has been discontinued. Echocardiogram on 07/28 showed LVEF 30-35%, left ventricle with moderately decreased function, mildly reduced right ventricular systolic function, mitral valve repair with trivial mitral valve regurgitation, and trivial aortic valve regurgitation no other valvular abnormalities noted.   The patient lives with his wife in a 2 story home but the primary bedroom is on the first floor. He is retired but previously worked in [employer] traffic [employer]. His last workout was 1 week ago and he overall remains active. He reports his cancer has remained stable for the last few years and he continues to follow up with oncology with CT scans. He does report he has developed some depression since the cancer diagnosis and treatment and was started on a medication recently but developed the chest pain soon after starting the medication so he stopped it and does not remember the name. The patient reports he is worried about going back on a heart lung machine.    Current Activity/ Functional Status: Patient is independent with mobility/ambulation, transfers, ADL's, IADL's.   Zubrod Score: At the time of  surgery this patient's most appropriate activity status/level should be described as: []     0    Normal activity, no symptoms [x]     1    Restricted in physical strenuous activity but ambulatory, able to do out light work []     2    Ambulatory and capable of self care, unable to do work activities, up and about                 more than 50%  Of the time                            []     3    Only limited self care, in bed greater than 50%  of waking hours []     4    Completely disabled, no self care, confined to bed or chair []     5    Moribund  Past Medical History:  Diagnosis Date   Adenocarcinoma of right lung, stage 3 (HCC) dx'd 03/2019   CAD S/P percutaneous coronary angioplasty 2004   (Ant STEMI) - Prox LAD 100% => 2 overlapping 3.5 x 1.8 mm Cypher DES stents.;  Patent as of August 2015   Dyslipidemia, goal LDL below 70    Essential hypertension    GERD (gastroesophageal reflux disease)    History of Mitral valve prolapse    Moderate - with moderate MR, noted February t 2013   History of radiation therapy    Right Pelvis & left humerus- 12/05/21-12/16/21- Dr. Lynwood Nasuti   History of Severe mitral regurgitation by prior echocardiogram 02/06/2014   TEE: Severe mitral regurgitation with a flail P2 segment and ruptured; Normal LV size & function, dilated LA.   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    Past Surgical History:  Procedure Laterality Date   ANTERIOR CRUCIATE LIGAMENT REPAIR Left 1993   COLONOSCOPY     CORONARY BALLOON ANGIOPLASTY N/A 03/10/2024   Procedure: CORONARY BALLOON ANGIOPLASTY;  Surgeon: Anner Alm ORN, MD;  Location: [facility] INVASIVE CV LAB;  Service: Cardiovascular;  Laterality: N/A;   INTRAOPERATIVE TRANSESOPHAGEAL ECHOCARDIOGRAM N/A 04/08/2014   Procedure: INTRAOPERATIVE  TRANSESOPHAGEAL ECHOCARDIOGRAM;  Surgeon: Sudie VEAR Laine, MD;  Location: [facility] OR;  Service: Open Heart Surgery;  Laterality: N/A;   IR RADIOLOGIST EVAL & MGMT  10/04/2022   IR RADIOLOGIST EVAL & MGMT  11/01/2022   IR RADIOLOGIST EVAL & MGMT  12/06/2022   IR RADIOLOGIST EVAL & MGMT  01/23/2023   LEFT AND RIGHT HEART CATHETERIZATION WITH CORONARY ANGIOGRAM N/A 03/05/2014   Procedure: LEFT AND RIGHT HEART CATHETERIZATION WITH CORONARY ANGIOGRAM;  Surgeon: Alm ORN Anner, MD;  Location: [facility] CATH LAB;  Service: Cardiovascular; (pre-op) Widely patent LAD stents, 40% distal LAD, 30% Cx, ~50% PL-PDA bifurcation lesion    LEFT HEART CATH AND CORONARY ANGIOGRAPHY  2004  In setting of anterior STEMI, found to have 100% % proximal LAD (2 DES Cypher stents)   LEFT HEART CATH AND CORONARY ANGIOGRAPHY  2007   Widely patent LAD stents, 40% distal LAD, 30% Cx, ~50% PL-PDA bifurcation lesion    LEFT HEART CATH AND CORONARY ANGIOGRAPHY N/A 03/10/2024   Procedure: LEFT HEART CATH AND CORONARY ANGIOGRAPHY;  Surgeon: Anner Alm ORN, MD;  Location: [facility] INVASIVE CV LAB;  Service: Cardiovascular;  Laterality: N/A;   LEFT HEART CATHETERIZATION WITH CORONARY ANGIOGRAM N/A 09/15/2011   Procedure: LEFT HEART CATHETERIZATION WITH CORONARY ANGIOGRAM;  Surgeon: Dorn JINNY Lesches, MD;  Location: [facility] CATH LAB;  Service: Cardiovascular;  Laterality: N/A;   MITRAL VALVE REPAIR Right 04/08/2014   Procedure: MINIMALLY INVASIVE MITRAL VALVE REPAIR (MVR);  Surgeon: Sudie VEAR Laine, MD;  Location: [facility] OR;  Service: Open Heart Surgery;  Laterality: Right;   NM MYOVIEW  LTD  12/2009   Walk 9 minutes, and 10 METs, diaphragmatic attenuation but no ischemia or infarction.   SVT ABLATION N/A 10/17/2019   Procedure: SVT ABLATION;  Surgeon: Inocencio Soyla Lunger, MD;  Location: [facility] INVASIVE CV LAB;  Service: Cardiovascular;  Laterality: N/A;   TEE WITHOUT CARDIOVERSION N/A 02/06/2014   Procedure: TRANSESOPHAGEAL ECHOCARDIOGRAM (TEE);  Surgeon: Vinie KYM Maxcy, MD;  Normal LV Size U& function - EF 55-60%, no regional WMA.  MV P2 Leaflet is flail with ruptured chord with severe prolapse, anterior leaflet intact.  Severe, eccentric anterior directed MR with dilated LA.   TRANSTHORACIC ECHOCARDIOGRAM  07/18/2018   Normal LV size and function.  EF 50-60 %.  Unable to assess diastolic function.  Mitral valve sewing ring in place.  Mild stenosis with a gradient of 6 mmHg noted. -   TRANSTHORACIC ECHOCARDIOGRAM  09/30/2021   Stable findings.  EF 50 to 55%.  No RWMA.  Mitral valve stable.  No significant MR.  Normal aortic valve.  Mild aortic dilation, but probably normal for age.   VIDEO BRONCHOSCOPY WITH ENDOBRONCHIAL ULTRASOUND N/A 05/16/2019   Procedure: VIDEO BRONCHOSCOPY WITH ENDOBRONCHIAL ULTRASOUND;  Surgeon: Mannam, Praveen, MD;  Location: [facility] OR;  Service: Pulmonary;  Laterality: N/A;    Social History   Tobacco Use  Smoking Status Never  Smokeless Tobacco Never    Social History   Substance and Sexual Activity  Alcohol Use Yes   Alcohol/week: 10.0 standard drinks of alcohol   Types: 5 Cans of beer, 5 Shots of liquor per week   Comment: social     Allergies  Allergen Reactions   A-Cillin [Ampicillin] Shortness Of Breath, Swelling and Rash    Did it involve swelling of the face/tongue/throat, SOB, or low BP? Yes Did it involve sudden or severe rash/hives, skin peeling, or any reaction on the inside of your mouth or nose? Yes Did you need to seek medical attention at a hospital or doctor's office? Yes When did it last happen? ~20 years ago If all above answers are NO, may proceed with cephalosporin use.     Amoxicillin Hives, Shortness Of Breath and Swelling    Did it involve swelling of the face/tongue/throat, SOB, or low BP? Yes Did it involve sudden or severe rash/hives, skin peeling, or any reaction on the inside of your mouth or nose? Yes Did you need to seek medical attention at a hospital or doctor's office? Yes When  did it last happen? ~20 years ago If all above answers are NO, may proceed with cephalosporin use.    Other Hives, Shortness Of  Breath and Swelling    ALL CILLINS   Penicillins Hives, Shortness Of Breath and Swelling    Did it involve swelling of the face/tongue/throat, SOB, or low BP? Yes Did it involve sudden or severe rash/hives, skin peeling, or any reaction on the inside of your mouth or nose? Yes Did you need to seek medical attention at a hospital or doctor's office? Yes When did it last happen? ~20 years ago If all above answers are NO, may proceed with cephalosporin use.     Sulfa Antibiotics Nausea Only   Crestor  [Rosuvastatin ] Other (See Comments)    G I upset    Current Facility-Administered Medications  Medication Dose Route Frequency Provider Last Rate Last Admin   0.9 %  sodium chloride  infusion  250 mL Intravenous PRN Anner Alm ORN, MD       0.9 %  sodium chloride  infusion   Intravenous Continuous Nishan, Peter C, MD       acetaminophen  (TYLENOL ) tablet 650 mg  650 mg Oral Q4H PRN Henry Manuelita NOVAK, NP       aspirin  EC tablet 81 mg  81 mg Oral Daily Henry Manuelita B, NP       atorvastatin  (LIPITOR) tablet 80 mg  80 mg Oral Daily Henry Manuelita B, NP       cangrelor  (KENGREAL ) 50 mg in sodium chloride  0.9 % 250 mL (0.2 mg/mL) infusion  0.75 mcg/kg/min Intravenous Continuous Nishan, Peter C, MD       heparin  ADULT infusion 100 units/mL (25000 units/250mL)  1,100 Units/hr Intravenous Continuous Nishan, Peter C, MD 11 mL/hr at 03/11/24 0833 1,100 Units/hr at 03/11/24 9166   melatonin tablet 10 mg  10 mg Oral QHS Anner Alm ORN, MD   10 mg at 03/11/24 9795   nitroGLYCERIN  (NITROSTAT ) SL tablet 0.4 mg  0.4 mg Sublingual Q5 Min x 3 PRN Henry Manuelita NOVAK, NP       ondansetron  (ZOFRAN ) injection 4 mg  4 mg Intravenous Q6H PRN Roberts, Lindsay B, NP       sodium chloride  flush (NS) 0.9 % injection 3 mL  3 mL Intravenous Q12H Anner Alm ORN, MD   3 mL at 03/11/24  0205   sodium chloride  flush (NS) 0.9 % injection 3 mL  3 mL Intravenous PRN Anner Alm ORN, MD        Medications Prior to Admission  Medication Sig Dispense Refill Last Dose/Taking   Ascorbic Acid  (VITAMIN C ) 1000 MG tablet Take 2,000 mg by mouth daily.    03/09/2024 Morning   atorvastatin  (LIPITOR) 10 MG tablet Take 1 tablet (10 mg total) by mouth daily. 90 tablet 3 03/09/2024 Morning   Coenzyme Q10 (CO Q 10) 100 MG CAPS Take 100 mg by mouth daily.    03/09/2024 Morning   diazepam  (VALIUM ) 2 MG tablet Take 1 tablet (2 mg total) by mouth every 12 (twelve) hours as needed for up to 12 doses for anxiety. 12 tablet 0 Past Month   famotidine  (PEPCID ) 20 MG tablet Take 1 tablet (20 mg total) by mouth 2 (two) times daily. 60 tablet 0 03/09/2024 Evening   latanoprost  (XALATAN ) 0.005 % ophthalmic solution Place 1 drop into both eyes every morning.   03/09/2024 Morning   magnesium  30 MG tablet Take 1 tablet by mouth at bedtime.   03/09/2024 Bedtime   Melatonin 10 MG TABS Take 10 mg by mouth at bedtime.   03/09/2024 Bedtime   NALTREXONE HCL, PAIN, PO Take 5 mg by mouth  daily as needed (for pain).   03/09/2024 Morning   OVER THE COUNTER MEDICATION Take 4 tablets by mouth in the morning, at noon, and at bedtime.  Relief Factor  from online.   03/09/2024 Morning   Specialty Vitamins Products (PROSTATE PO) Take 1 capsule by mouth daily. PROSTATE FORMULA   03/09/2024 Morning   sucralfate  (CARAFATE ) 1 g tablet Take 1 tablet (1 g total) by mouth 3 (three) times daily before meals. 90 tablet 0 03/09/2024 Evening   traMADol  (ULTRAM ) 50 MG tablet Take 50 mg by mouth 3 (three) times daily as needed for moderate pain (pain score 4-6).   Past Month   vitamin B-12 (CYANOCOBALAMIN) 1000 MCG tablet Take 1,000 mcg by mouth daily.   03/09/2024 Morning   Vitamin D-Vitamin K (D3 + K2 DOTS PO) Take 1 tablet by mouth daily.   03/09/2024 Morning    Family History  Problem Relation Age of Onset   Hypertension Mother    Heart  Problems Father        triple bypass 1989   Cancer Paternal Grandmother        Pancreatic     Review of Systems:   Review of Systems  Constitutional:  Positive for malaise/fatigue and weight loss.       Lost about 20lbs over the last month  HENT:  Negative for hearing loss.   Eyes:  Negative for blurred vision.  Respiratory:  Negative for cough, shortness of breath and wheezing.        Hoarseness over the last month  Cardiovascular:  Positive for chest pain. Negative for palpitations and leg swelling.  Gastrointestinal:  Positive for abdominal pain. Negative for constipation, diarrhea, heartburn, nausea and vomiting.  Genitourinary:  Negative for dysuria.  Musculoskeletal:  Negative for falls and myalgias.  Skin:  Negative for rash.  Neurological:  Positive for dizziness. Negative for weakness and headaches.       Lightheadedness  Endo/Heme/Allergies:  Does not bruise/bleed easily.  Psychiatric/Behavioral:  Positive for depression. The patient is nervous/anxious.   Dentist every 6 months does not take antibiotics  Physical Exam: BP (!) 81/63 (BP Location: Right Arm) Comment: MD made aware  Pulse 92   Temp 97.7 F (36.5 C) (Oral)   Resp 16   Ht 6' 4 (1.93 m)   Wt 79 kg   SpO2 100%   BMI 21.20 kg/m   General appearance: alert, cooperative, and no distress Head: Normocephalic, without obvious abnormality, atraumatic Neck: no adenopathy, no carotid bruit, no JVD, supple, symmetrical, trachea midline, and thyroid  not enlarged, symmetric, no tenderness/mass/nodules Lymph nodes: Cervical, supraclavicular, and axillary nodes normal. Resp: clear to auscultation bilaterally Cardio: regular rate and rhythm, S1, S2 normal, no murmur, click, rub or gallop GI: soft, non-tender; bowel sounds normal; no masses,  no organomegaly Extremities: extremities normal, atraumatic, no cyanosis or edema Neurologic: Grossly normal  Diagnostic Studies & Radiology Findings:  LEFT HEART CATH AND  CORONARY ANGIOGRAPHY  CORONARY BALLOON ANGIOPLASTY     Previously placed Ost LAD to Prox LAD stent is 99% in-stent restenosis (with TIMI I-0 flow distally).   Scoring balloon angioplasty was performed using a BALLOON SCOREFLEX 3.50X15.  Post intervention, there is a 60% residual stenosis.  TIMI-3 flow restored   ------------------------------------------   Mid LAD to Dist LAD lesion is 80% stenosed.  TIMI 0-I flow.   Balloon angioplasty was performed using a BALLOON TAKERU 2.0X12. Post intervention, there is a 50% residual stenosis.  TIMI-3 flow distally restored distally   ------------------------------------------  Prox RCA-1 lesion is 45% stenosed. Prox RCA-2 lesion is 70% stenosed.   Ost Cx lesion is 40% stenosed.   ------------------------------------------   LV end diastolic pressure is normal.   There is no aortic valve stenosis.   Diagnostic    Dominance: Right                                                             Intervention  Multivessel disease: Likely culprit is 99% eccentric fibrotic/calcified ISR of ostial-proximal LAD stent from 2004-2005 with TIMI 0 flow to the apex, also noted focal mid LAD 80% stenosis just prior to small diagonal branch Difficult PTCA of ISR starting with a 2.0 mm balloon upsizing to 2.5 mm flex followed by 3.5 mm flex inflated to high atmospheres reducing the stenosis to ~50 to 60%, and PTCA only of the mid LAD 80% stenosis using a 2.0 mm balloon reducing to 50% stenosis. Questionable ostial LCx-in some images appears to be widely patent another images appear to be at least 60 to 70% stenosed.  (Will need IVUS or FFR wire assessment).  The main to the LCx is free of any disease has a small OM1 branch and then terminates as a LPL 1. RCA with apparent ulcerated plaque at a small proximal branch followed by a focal concentric heavily calcified 70% stenosis.  The remainder of the RCA has mild diffuse disease and terminates as a large near wraparound PDA  with 2 major PL branches and 1 minor branch.  Mild diffuse disease. Normal LVEDP     ECHOCARDIOGRAM REPORT       Patient Name:   Alan [name] Date of Exam: 03/10/2024 Medical Rec #:  [id number]        Height:       72.0 in Accession #:    [id number]       Weight:       174.2 lb Date of Birth:  09/04/1955         BSA:          2.009 m Patient Age:    68 years         BP:           122/93 mmHg Patient Gender: M                HR:           83 bpm. Exam Location:  Inpatient  Procedure: 2D Echo, Cardiac Doppler, Color Doppler and Intracardiac            Opacification Agent (Both Spectral and Color Flow Doppler were            utilized during procedure).  Indications:    Myocardial Infact I21.9   History:        Patient has prior history of Echocardiogram examinations, most                 recent 09/28/2021. Acute MI and CAD, Arrythmias:Tachycardia; Risk                 Factors:Hypertension and Dyslipidemia. MV repair Dusty                 04/08/2014-Complex Valvular Repair utilizing a 34 mm Sorin Memo  3D Reochord Ring Annuloplasty                 -Quadrangular resection with sliding plasty of the posterior                 leaflet (p2)                 -Suspension of Neochords x 4                 -Chordal Transfer.                   Mitral Valve: 34 Sorin Memo 3D Reochord Ring prosthetic                 annuloplasty ring valve is present in the mitral position.                 Procedure Date: 04/08/2014.   Sonographer:    Thea Norlander RCS Referring Phys: MAUDE JAYSON EMMER    Sonographer Comments: Technically difficult study due to poor echo windows and no parasternal window. Attempted Definity  contrast. IMPRESSIONS    1. Left ventricular ejection fraction, by estimation, is 30 to 35%. The left ventricle has moderately decreased function. The left ventricle demonstrates regional wall motion abnormalities (see scoring diagram/findings for description). Left  ventricular  diastolic parameters are indeterminate.  2. LV anteroseptal, mid anterior, and apical akinesis.  3. Right ventricular systolic function is mildly reduced. The right ventricular size is normal. Tricuspid regurgitation signal is inadequate for assessing PA pressure.  4. The mitral valve has been repaired/replaced. Trivial mitral valve regurgitation. The mean mitral valve gradient is 4.0 mmHg with average heart rate of 83 bpm. There is a 34 Sorin Memo 3D Reochord Ring prosthetic annuloplasty ring present in the mitral position. Procedure Date: 04/08/2014. Echo findings are consistent with normal structure and function of the mitral valve prosthesis.  5. The aortic valve was not well visualized. Aortic valve regurgitation is trivial.  6. The inferior vena cava is normal in size with greater than 50% respiratory variability, suggesting right atrial pressure of 3 mmHg.  FINDINGS  Left Ventricle: Left ventricular ejection fraction, by estimation, is 30 to 35%. The left ventricle has moderately decreased function. The left ventricle demonstrates regional wall motion abnormalities. Definity  contrast agent was given IV to delineate the left ventricular endocardial borders. The left ventricular internal cavity size was normal in size. Suboptimal image quality limits for assessment of left ventricular hypertrophy. Left ventricular diastolic parameters are indeterminate.  Right Ventricle: The right ventricular size is normal. No increase in right ventricular wall thickness. Right ventricular systolic function is mildly reduced. Tricuspid regurgitation signal is inadequate for assessing PA pressure.  Left Atrium: Left atrial size was normal in size.  Right Atrium: Right atrial size was normal in size.  Pericardium: There is no evidence of pericardial effusion.  Mitral Valve: The mitral valve has been repaired/replaced. Trivial mitral valve regurgitation. There is a 34 Sorin Memo  3D Reochord Ring prosthetic annuloplasty ring present in the mitral position. Procedure Date: 04/08/2014. Echo findings are consistent with normal structure and function of the mitral valve prosthesis. MV peak gradient, 9.7 mmHg. The mean mitral valve gradient is 4.0 mmHg with average heart rate of 83 bpm.  Tricuspid Valve: The tricuspid valve is grossly normal. Tricuspid valve regurgitation is not demonstrated. No evidence of tricuspid stenosis.  Aortic Valve: The aortic valve was not well visualized. Aortic valve regurgitation is trivial. Aortic valve peak  gradient measures 2.0 mmHg.  Pulmonic Valve: The pulmonic valve was not well visualized. Pulmonic valve regurgitation is not visualized.  Aorta: The aortic root was not well visualized.  Venous: The inferior vena cava is normal in size with greater than 50% respiratory variability, suggesting right atrial pressure of 3 mmHg.  IAS/Shunts: The atrial septum is grossly normal.      Diastology LV e' medial:    5.87 cm/s LV E/e' medial:  13.9 LV e' lateral:   6.31 cm/s LV E/e' lateral: 12.9    RIGHT VENTRICLE             IVC RV S prime:     10.00 cm/s  IVC diam: 1.70 cm TAPSE (M-mode): 1.4 cm  LEFT ATRIUM             Index        RIGHT ATRIUM           Index LA Vol (A2C):   23.3 ml 11.60 ml/m  RA Area:     10.20 cm LA Vol (A4C):   18.8 ml 9.36 ml/m   RA Volume:   16.70 ml  8.31 ml/m LA Biplane Vol: 21.6 ml 10.75 ml/m  AORTIC VALVE AV Vmax:      71.50 cm/s AV Peak Grad: 2.0 mmHg LVOT Vmax:    62.20 cm/s LVOT Vmean:   39.900 cm/s LVOT VTI:     0.099 m  MITRAL VALVE MV Area (PHT): 3.27 cm     SHUNTS MV Peak grad:  9.7 mmHg     Systemic VTI: 0.10 m MV Mean grad:  4.0 mmHg MV Vmax:       1.56 m/s MV Vmean:      92.7 cm/s MV Decel Time: 232 msec MV E velocity: 81.40 cm/s MV A velocity: 146.00 cm/s MV E/A ratio:  0.56  Soyla Merck MD Electronically signed by Soyla Merck MD Signature Date/Time:  03/11/2024/12:03:58 AM       Final        Assessment & Plan: CAD s/p LAD DES 2004: Now s/p balloon angioplasty with residual stenosis. Patient would benefit from CABG surgery but history of mini mitral valve repair and metastatic lung cancer s/p radiotherapy will make this patient higher risk as well as EF 30-35%. Patient also received a dose of Effient  yesterday, now on Cangrelor  and Heparin . Dr. Kerrin to review and ultimately determine surgical candidacy and timing. Tentatively plan for Monday 08/04 after Effient  washout.  Mitral valve prolapse S/P mini mitral valve repair: Done in 2015 by Dr. Dusty, trivial mitral valve regurgitation but no other valvular abnormalities on echo. Patient reports he goes to the dentist every 6 months but he does not take antibiotics prior to dental visits.  HTN HLD Metastatic Lung cancer: Non small cell lung cancer (originated from right lung) diagnosed in 2020 with small bony mets in 2023 treated with palliative radiotherapy to the right iliac bone and left humerus. Follows with oncology, has remained stable on recent scans.  SVT: s/p ablation 2021  Con GORMAN Bend, PA-C 03/11/24 I spent approximately 32 minutes face to face with the patient   Patient seen and examined, records and cath images reviewed.  68 yo man with history of CAD, stents, MV repair via mini-thoracotomy, ablation, hypertension, hyperlipidemia and metastatic lung cancer.  Presents with unstable CP and r/i for non-STEMI.  Echo showed trivial MR and EF 30%.  Cath showed severe 3 vessel CAD with 99% stenosis of LAD with TIMI 1 flow-  reopened with residual 50% stenosis and TIMI 3 flow.  CABG indicated for relief of symptoms.  Also survival benefit as it relates to CAD.  Metastatic lung cancer- currently under observation with no known active disease.  I discussed the general nature of the procedure, including the need for general anesthesia, the incisions to be used, the use of  cardiopulmonary bypass, and the use of temporary pacemaker wires and drainage tubes postoperatively with Mr. Chriscoe.  We discussed the expected hospital stay, overall recovery and short and long term outcomes. I informed him of the indications, risks, benefits and alternatives.   He understands the risks include, but are not limited to death, stroke, MI, DVT/PE, bleeding, possible need for transfusion, infections, cardiac arrhythmias, as well as other organ system dysfunction including respiratory, renal, or GI complications.    Higher than normal risk with redo, EF 30% and prior cardiac surgery.  He wishes to proceed.  Need to wait for effient  washout.  Elspeth MOTE Kerrin, MD [facility] [phone]

## 2024-03-12 ENCOUNTER — Inpatient Hospital Stay (HOSPITAL_COMMUNITY)

## 2024-03-12 DIAGNOSIS — I214 Non-ST elevation (NSTEMI) myocardial infarction: Secondary | ICD-10-CM | POA: Diagnosis not present

## 2024-03-12 DIAGNOSIS — Z01810 Encounter for preprocedural cardiovascular examination: Secondary | ICD-10-CM | POA: Diagnosis not present

## 2024-03-12 LAB — PULMONARY FUNCTION TEST
DL/VA % pred: 79 %
DL/VA: 3.16 ml/min/mmHg/L
DLCO cor % pred: 74 %
DLCO cor: 23.45 ml/min/mmHg
DLCO unc % pred: 68 %
DLCO unc: 21.53 ml/min/mmHg
FEF 25-75 Post: 2.2 L/s
FEF 25-75 Pre: 2.44 L/s
FEF2575-%Change-Post: -10 %
FEF2575-%Pred-Post: 68 %
FEF2575-%Pred-Pre: 76 %
FEV1-%Change-Post: -1 %
FEV1-%Pred-Post: 82 %
FEV1-%Pred-Pre: 83 %
FEV1-Post: 3.43 L
FEV1-Pre: 3.49 L
FEV1FVC-%Change-Post: 2 %
FEV1FVC-%Pred-Pre: 89 %
FEV6-%Change-Post: -5 %
FEV6-%Pred-Post: 92 %
FEV6-%Pred-Pre: 97 %
FEV6-Post: 4.93 L
FEV6-Pre: 5.2 L
FEV6FVC-%Change-Post: 0 %
FEV6FVC-%Pred-Post: 104 %
FEV6FVC-%Pred-Pre: 104 %
FVC-%Change-Post: -4 %
FVC-%Pred-Post: 89 %
FVC-%Pred-Pre: 93 %
FVC-Post: 5 L
FVC-Pre: 5.24 L
Post FEV1/FVC ratio: 69 %
Post FEV6/FVC ratio: 99 %
Pre FEV1/FVC ratio: 67 %
Pre FEV6/FVC Ratio: 99 %
RV % pred: 147 %
RV: 4.02 L
TLC % pred: 109 %
TLC: 9.03 L

## 2024-03-12 LAB — LIPOPROTEIN A (LPA): Lipoprotein (a): 121.5 nmol/L — ABNORMAL HIGH (ref <75.0)

## 2024-03-12 LAB — VAS US DOPPLER PRE CABG
Left ABI: 1.17
Right ABI: 1.43

## 2024-03-12 LAB — CBC
HCT: 36.9 % — ABNORMAL LOW (ref 39.0–52.0)
Hemoglobin: 12 g/dL — ABNORMAL LOW (ref 13.0–17.0)
MCH: 31.6 pg (ref 26.0–34.0)
MCHC: 32.5 g/dL (ref 30.0–36.0)
MCV: 97.1 fL (ref 80.0–100.0)
Platelets: 127 K/uL — ABNORMAL LOW (ref 150–400)
RBC: 3.8 MIL/uL — ABNORMAL LOW (ref 4.22–5.81)
RDW: 12.7 % (ref 11.5–15.5)
WBC: 4.9 K/uL (ref 4.0–10.5)
nRBC: 0 % (ref 0.0–0.2)

## 2024-03-12 LAB — BASIC METABOLIC PANEL WITH GFR
Anion gap: 8 (ref 5–15)
BUN: 11 mg/dL (ref 8–23)
CO2: 22 mmol/L (ref 22–32)
Calcium: 9.2 mg/dL (ref 8.9–10.3)
Chloride: 111 mmol/L (ref 98–111)
Creatinine, Ser: 0.71 mg/dL (ref 0.61–1.24)
GFR, Estimated: >60 mL/min (ref >60)
Glucose, Bld: 102 mg/dL — ABNORMAL HIGH (ref 70–99)
Potassium: 3.7 mmol/L (ref 3.5–5.1)
Sodium: 141 mmol/L (ref 135–145)

## 2024-03-12 LAB — LIPID PANEL
Cholesterol: 120 mg/dL (ref 0–200)
HDL: 40 mg/dL — ABNORMAL LOW (ref >40)
LDL Cholesterol: 58 mg/dL (ref 0–99)
Total CHOL/HDL Ratio: 3 ratio
Triglycerides: 111 mg/dL (ref <150)
VLDL: 22 mg/dL (ref 0–40)

## 2024-03-12 LAB — HEMOGLOBIN A1C
Hgb A1c MFr Bld: 5 % (ref 4.8–5.6)
Mean Plasma Glucose: 96.8 mg/dL

## 2024-03-12 LAB — HEPARIN LEVEL (UNFRACTIONATED)
Heparin Unfractionated: 0.4 [IU]/mL (ref 0.30–0.70)
Heparin Unfractionated: 0.49 [IU]/mL (ref 0.30–0.70)

## 2024-03-12 MED ORDER — POLYETHYLENE GLYCOL 3350 17 G PO PACK: 17.0000 g | PACK | Freq: Every day | ORAL | Status: DC | PRN

## 2024-03-12 MED ORDER — BISACODYL 5 MG PO TBEC: 5.0000 mg | DELAYED_RELEASE_TABLET | Freq: Every day | ORAL | Status: DC | PRN

## 2024-03-12 MED ORDER — ALBUTEROL SULFATE (2.5 MG/3ML) 0.083% IN NEBU
2.5000 mg | INHALATION_SOLUTION | Freq: Once | RESPIRATORY_TRACT | Status: AC
  Administered 2024-03-12: 2.5 mg via RESPIRATORY_TRACT

## 2024-03-12 NOTE — Progress Notes (Signed)
 PHARMACY - ANTICOAGULATION  Pharmacy Consult for heparin  Indication: multivessel CAD  Brief A/P: Heparin  level subtherapeutic Increase Heparin  rate  Allergies  Allergen Reactions   A-Cillin [Ampicillin] Shortness Of Breath, Swelling and Rash    Did it involve swelling of the face/tongue/throat, SOB, or low BP? Yes Did it involve sudden or severe rash/hives, skin peeling, or any reaction on the inside of your mouth or nose? Yes Did you need to seek medical attention at a hospital or doctor's office? Yes When did it last happen? ~20 years ago If all above answers are NO, may proceed with cephalosporin use.     Amoxicillin Hives, Shortness Of Breath and Swelling    Did it involve swelling of the face/tongue/throat, SOB, or low BP? Yes Did it involve sudden or severe rash/hives, skin peeling, or any reaction on the inside of your mouth or nose? Yes Did you need to seek medical attention at a hospital or doctor's office? Yes When did it last happen? ~20 years ago If all above answers are NO, may proceed with cephalosporin use.    Other Hives, Shortness Of Breath and Swelling    ALL CILLINS   Penicillins Hives, Shortness Of Breath and Swelling    Did it involve swelling of the face/tongue/throat, SOB, or low BP? Yes Did it involve sudden or severe rash/hives, skin peeling, or any reaction on the inside of your mouth or nose? Yes Did you need to seek medical attention at a hospital or doctor's office? Yes When did it last happen? ~20 years ago If all above answers are NO, may proceed with cephalosporin use.     Sulfa Antibiotics Nausea Only   Crestor  [Rosuvastatin ] Other (See Comments)    G I upset    Patient Measurements: Height: 6' 4 (193 cm) Weight: 79 kg (174 lb 2.6 oz) IBW/kg (Calculated) : 86.8 HEPARIN  DW (KG): 79  Vital Signs: Temp: 97.8 F (36.6 C) (07/29 1936) Temp Source: Oral (07/29 1936) BP: 104/73 (07/29 1936) Pulse Rate: 99 (07/29 1936)  Labs: Recent  Labs    03/10/24 1215 03/10/24 1415 03/11/24 0422 03/11/24 1140 03/11/24 2150  HGB 14.7  --  13.0  --   --   HCT 44.4  --  37.6*  --   --   PLT 193  --  157  --   --   HEPARINUNFRC  --   --   --  0.20* 0.25*  CREATININE 0.68  --  0.75  --   --   TROPONINIHS 1,469* 1,157*  --   --   --     Estimated Creatinine Clearance: 100.1 mL/min (by C-G formula based on SCr of 0.75 mg/dL).   Assessment: 68 y.o. male with CAD s/p PTCA, awaiting CABG, for heparin    Goal of Therapy:  Heparin  level 0.3-0.7 units/ml Monitor platelets by anticoagulation protocol: Yes   Plan:  Increase Heparin  1400 units/hr Follow-up am labs.    Cathlyn Arrant, PharmD, BCPS

## 2024-03-12 NOTE — Progress Notes (Signed)
  Progress Note  Patient Name: Alan [name] Date of Encounter: 03/12/2024 [facility] Cardiologist: [name], MD   Interval Summary   Doing well, no acute complaints No chest pain, shortness of breath Did not sleep well last night with multiple trips to the restroom No issues with right radial cath insertion site  Aware of plans for CABG on Monday  Vital Signs Vitals:   03/11/24 1936 03/12/24 0014 03/12/24 0422 03/12/24 0818  BP: 104/73 (!) 89/60 92/62 99/76   Pulse: 99 86 79 90  Resp: 18 16 20 18   Temp: 97.8 F (36.6 C) 97.6 F (36.4 C) 97.9 F (36.6 C) (!) 97.5 F (36.4 C)  TempSrc: Oral Oral Oral Oral  SpO2: 97% 96% 96% 96%  Weight:      Height:        Intake/Output Summary (Last 24 hours) at 03/12/2024 0909 Last data filed at 03/12/2024 0902 Gross per 24 hour  Intake 1614.68 ml  Output 350 ml  Net 1264.68 ml      03/11/2024    1:41 AM 03/10/2024   12:12 PM 03/08/2024    2:56 PM  Last 3 Weights  Weight (lbs) 174 lb 2.6 oz 174 lb 2.6 oz 175 lb  Weight (kg) 79 kg 79 kg 79.379 kg     Telemetry/ECG  Sinus rhythm, HR 90s - Personally Reviewed  Physical Exam  GEN: No acute distress.   Neck: No JVD Cardiac: RRR, no murmurs, rubs, or gallops.  Respiratory: Clear to auscultation bilaterally. GI: Soft, nontender, non-distended  MS: No edema  Assessment & Plan   Multivessel CAD Ischemic cardiomyopathy  Hyperlipidemia  LHC 7/28 showed: 99% LAD in-stent restenosis, 60% residual stenosis after balloon angioplasty, mid-LAD 80% stenosis with 50% residual post balloon, possible ostial LCX 60-70% stenosis, RCA with ulcerated plaque and 70% stenosis, remained of RCA with mild diffuse disease Echo showed: EF 30-35%, LV RWMA, normal RV function  Right radial access site healing well, minimal bruising, no hematoma  03/10/2024: ALT 19 03/12/2024: HDL 40; LDL Cholesterol 58  Seen by CVTS who are planning for CABG on 8/4 after Effient  wash out Currently on ASA  81 mg daily Currently on Lipitor 80 mg daily  Currently on IV cangrelor   Currently on IV heparin   Metastatic NSCLC  Diagnosed in 2020 with mets to bones in 2023  Treated with palliative radiotherapy  Follows with oncology Stable on recent scans, no known active disease   For questions or updates, please contact [facility] Please consult [email or web address] for contact info under       Signed, Waddell DELENA Donath, PA-C

## 2024-03-12 NOTE — Progress Notes (Signed)
 Pre CABG exams are completed. Manuel Dall, RVT

## 2024-03-12 NOTE — Plan of Care (Signed)
  Problem: Education: Goal: Understanding of CV disease, CV risk reduction, and recovery process will improve Outcome: Progressing   Problem: Cardiac: Goal: Ability to achieve and maintain adequate cardiovascular perfusion will improve Outcome: Progressing

## 2024-03-12 NOTE — Progress Notes (Signed)
 PHARMACY - ANTICOAGULATION  Pharmacy Consult for heparin  Indication: multivessel CAD   Allergies  Allergen Reactions   A-Cillin [Ampicillin] Shortness Of Breath, Swelling and Rash    Did it involve swelling of the face/tongue/throat, SOB, or low BP? Yes Did it involve sudden or severe rash/hives, skin peeling, or any reaction on the inside of your mouth or nose? Yes Did you need to seek medical attention at a hospital or doctor's office? Yes When did it last happen? ~20 years ago If all above answers are NO, may proceed with cephalosporin use.     Amoxicillin Hives, Shortness Of Breath and Swelling    Did it involve swelling of the face/tongue/throat, SOB, or low BP? Yes Did it involve sudden or severe rash/hives, skin peeling, or any reaction on the inside of your mouth or nose? Yes Did you need to seek medical attention at a hospital or doctor's office? Yes When did it last happen? ~20 years ago If all above answers are NO, may proceed with cephalosporin use.    Other Hives, Shortness Of Breath and Swelling    ALL CILLINS   Penicillins Hives, Shortness Of Breath and Swelling    Did it involve swelling of the face/tongue/throat, SOB, or low BP? Yes Did it involve sudden or severe rash/hives, skin peeling, or any reaction on the inside of your mouth or nose? Yes Did you need to seek medical attention at a hospital or doctor's office? Yes When did it last happen? ~20 years ago If all above answers are NO, may proceed with cephalosporin use.     Sulfa Antibiotics Nausea Only   Crestor  [Rosuvastatin ] Other (See Comments)    G I upset    Patient Measurements: Height: 6' 4 (193 cm) Weight: 79 kg (174 lb 2.6 oz) IBW/kg (Calculated) : 86.8 HEPARIN  DW (KG): 79  Vital Signs: Temp: 98 F (36.7 C) (07/30 1630) Temp Source: Oral (07/30 1630) BP: 117/80 (07/30 1630) Pulse Rate: 82 (07/30 1630)  Labs: Recent Labs    03/10/24 1215 03/10/24 1415 03/11/24 0422  03/11/24 1140 03/11/24 2150 03/12/24 0444 03/12/24 1641  HGB 14.7  --  13.0  --   --  12.0*  --   HCT 44.4  --  37.6*  --   --  36.9*  --   PLT 193  --  157  --   --  127*  --   HEPARINUNFRC  --   --   --    < > 0.25* 0.40 0.49  CREATININE 0.68  --  0.75  --   --  0.71  --   TROPONINIHS 1,469* 1,157*  --   --   --   --   --    < > = values in this interval not displayed.    Estimated Creatinine Clearance: 100.1 mL/min (by C-G formula based on SCr of 0.71 mg/dL).   Assessment: 68 y.o. male with CAD s/p PTCA, awaiting CABG (scheduled for 03/17/24).   Heparin  level came back therapeutic on upper end of goal range at 0.49, on 1400 units/hr. Hgb 12s, plt 127. No s/sx of bleeding or infusion issues.   Goal of Therapy:  Heparin  level 0.3-0.5 units/ml (lower goal due to concomitant cangrelor ) Monitor platelets by anticoagulation protocol: Yes   Plan:  Given on higher end of goal range, will reduce heparin  rate slightly to 1350 units/hr to keep in goal range Heparin  level in 8 hours with AM labs  Monitor heparin  level, CBC, and signs of  bleeding daily.   Thank you for allowing pharmacy to participate in this patient's care,  Suzen Sour, PharmD, BCCCP Clinical Pharmacist  Phone: [phone] 03/12/2024 5:38 PM  Please check AMION for all [facility]acy phone numbers After 10:00 PM, call Main Pharmacy [phone]

## 2024-03-12 NOTE — Progress Notes (Signed)
 PHARMACY - ANTICOAGULATION  Pharmacy Consult for heparin  Indication: multivessel CAD   Allergies  Allergen Reactions   A-Cillin [Ampicillin] Shortness Of Breath, Swelling and Rash    Did it involve swelling of the face/tongue/throat, SOB, or low BP? Yes Did it involve sudden or severe rash/hives, skin peeling, or any reaction on the inside of your mouth or nose? Yes Did you need to seek medical attention at a hospital or doctor's office? Yes When did it last happen? ~20 years ago If all above answers are NO, may proceed with cephalosporin use.     Amoxicillin Hives, Shortness Of Breath and Swelling    Did it involve swelling of the face/tongue/throat, SOB, or low BP? Yes Did it involve sudden or severe rash/hives, skin peeling, or any reaction on the inside of your mouth or nose? Yes Did you need to seek medical attention at a hospital or doctor's office? Yes When did it last happen? ~20 years ago If all above answers are NO, may proceed with cephalosporin use.    Other Hives, Shortness Of Breath and Swelling    ALL CILLINS   Penicillins Hives, Shortness Of Breath and Swelling    Did it involve swelling of the face/tongue/throat, SOB, or low BP? Yes Did it involve sudden or severe rash/hives, skin peeling, or any reaction on the inside of your mouth or nose? Yes Did you need to seek medical attention at a hospital or doctor's office? Yes When did it last happen? ~20 years ago If all above answers are NO, may proceed with cephalosporin use.     Sulfa Antibiotics Nausea Only   Crestor  [Rosuvastatin ] Other (See Comments)    G I upset    Patient Measurements: Height: 6' 4 (193 cm) Weight: 79 kg (174 lb 2.6 oz) IBW/kg (Calculated) : 86.8 HEPARIN  DW (KG): 79  Vital Signs: Temp: 97.9 F (36.6 C) (07/30 0422) Temp Source: Oral (07/30 0422) BP: 92/62 (07/30 0422) Pulse Rate: 79 (07/30 0422)  Labs: Recent Labs    03/10/24 1215 03/10/24 1415 03/11/24 0422  03/11/24 1140 03/11/24 2150 03/12/24 0444  HGB 14.7  --  13.0  --   --  12.0*  HCT 44.4  --  37.6*  --   --  36.9*  PLT 193  --  157  --   --  127*  HEPARINUNFRC  --   --   --  0.20* 0.25* 0.40  CREATININE 0.68  --  0.75  --   --  0.71  TROPONINIHS 1,469* 1,157*  --   --   --   --     Estimated Creatinine Clearance: 100.1 mL/min (by C-G formula based on SCr of 0.71 mg/dL).   Assessment: 68 y.o. male with CAD s/p PTCA, awaiting CABG (scheduled for 03/17/24). Heparin  level is therapeutic for the first time since initiation at 1400 units/hr. Hgb and platelets stable. No issues with infusion or signs of bleeding noted.  Goal of Therapy:  Heparin  level 0.3-0.5 units/ml (lower goal due to concomitant cangrelor ) Monitor platelets by anticoagulation protocol: Yes   Plan:  Continue Heparin  1400 units/hr Heparin  level in 8 hours Monitor heparin  level, CBC, and signs of bleeding daily.   Maegan Jory Tanguma, PharmD PGY-1 Pharmacy Resident [facility] 03/12/2024 8:16 AM

## 2024-03-12 NOTE — Progress Notes (Signed)
 Discussed with pt's wife and then pt when he returned from testing IS, sternal precautions, mobility post op, d/c planning. Receptive. 2500 ml IS. She will be with pt at d/c. Encouraged pt to ambulate halls as long as he is sx free. Gave [facility] book to review.  [id number] Alan [name] BS, ACSM-CEP 03/12/2024 2:56 PM

## 2024-03-13 DIAGNOSIS — I214 Non-ST elevation (NSTEMI) myocardial infarction: Secondary | ICD-10-CM | POA: Diagnosis not present

## 2024-03-13 LAB — CBC
HCT: 34.8 % — ABNORMAL LOW (ref 39.0–52.0)
Hemoglobin: 11.7 g/dL — ABNORMAL LOW (ref 13.0–17.0)
MCH: 31.7 pg (ref 26.0–34.0)
MCHC: 33.6 g/dL (ref 30.0–36.0)
MCV: 94.3 fL (ref 80.0–100.0)
Platelets: 138 K/uL — ABNORMAL LOW (ref 150–400)
RBC: 3.69 MIL/uL — ABNORMAL LOW (ref 4.22–5.81)
RDW: 12.7 % (ref 11.5–15.5)
WBC: 4.5 K/uL (ref 4.0–10.5)
nRBC: 0 % (ref 0.0–0.2)

## 2024-03-13 LAB — HEPARIN LEVEL (UNFRACTIONATED): Heparin Unfractionated: 0.41 [IU]/mL (ref 0.30–0.70)

## 2024-03-13 MED ORDER — FUROSEMIDE 20 MG PO TABS
10.0000 mg | ORAL_TABLET | Freq: Once | ORAL | Status: AC
  Administered 2024-03-13: 10 mg via ORAL
  Filled 2024-03-13: qty 1

## 2024-03-13 MED ORDER — LATANOPROST 0.005 % OP SOLN
1.0000 [drp] | Freq: Every morning | OPHTHALMIC | Status: DC
  Administered 2024-03-13 – 2024-03-16 (×4): 1 [drp] via OPHTHALMIC
  Filled 2024-03-13: qty 2.5

## 2024-03-13 MED ORDER — SUCRALFATE 1 G PO TABS
1.0000 g | ORAL_TABLET | Freq: Three times a day (TID) | ORAL | Status: DC
  Administered 2024-03-14 – 2024-03-16 (×8): 1 g via ORAL
  Filled 2024-03-13 (×8): qty 1

## 2024-03-13 MED ORDER — FAMOTIDINE 20 MG PO TABS
20.0000 mg | ORAL_TABLET | Freq: Two times a day (BID) | ORAL | Status: DC
  Administered 2024-03-13 – 2024-03-16 (×7): 20 mg via ORAL
  Filled 2024-03-13 (×8): qty 1

## 2024-03-13 NOTE — Plan of Care (Signed)
  Problem: Education: Goal: Understanding of CV disease, CV risk reduction, and recovery process will improve Outcome: Progressing Goal: Individualized Educational Video(s) Outcome: Progressing   Problem: Activity: Goal: Ability to return to baseline activity level will improve Outcome: Progressing   

## 2024-03-13 NOTE — Progress Notes (Signed)
 PHARMACY - ANTICOAGULATION  Pharmacy Consult for heparin  Indication: multivessel CAD   Allergies  Allergen Reactions   A-Cillin [Ampicillin] Shortness Of Breath, Swelling and Rash    Did it involve swelling of the face/tongue/throat, SOB, or low BP? Yes Did it involve sudden or severe rash/hives, skin peeling, or any reaction on the inside of your mouth or nose? Yes Did you need to seek medical attention at a hospital or doctor's office? Yes When did it last happen? ~20 years ago If all above answers are NO, may proceed with cephalosporin use.     Amoxicillin Hives, Shortness Of Breath and Swelling    Did it involve swelling of the face/tongue/throat, SOB, or low BP? Yes Did it involve sudden or severe rash/hives, skin peeling, or any reaction on the inside of your mouth or nose? Yes Did you need to seek medical attention at a hospital or doctor's office? Yes When did it last happen? ~20 years ago If all above answers are NO, may proceed with cephalosporin use.    Other Hives, Shortness Of Breath and Swelling    ALL CILLINS   Penicillins Hives, Shortness Of Breath and Swelling    Did it involve swelling of the face/tongue/throat, SOB, or low BP? Yes Did it involve sudden or severe rash/hives, skin peeling, or any reaction on the inside of your mouth or nose? Yes Did you need to seek medical attention at a hospital or doctor's office? Yes When did it last happen? ~20 years ago If all above answers are NO, may proceed with cephalosporin use.     Sulfa Antibiotics Nausea Only   Crestor  [Rosuvastatin ] Other (See Comments)    G I upset    Patient Measurements: Height: 6' 4 (193 cm) Weight: 79 kg (174 lb 2.6 oz) IBW/kg (Calculated) : 86.8 HEPARIN  DW (KG): 79  Vital Signs: Temp: 98.3 F (36.8 C) (07/31 0515) Temp Source: Oral (07/31 0515) BP: 95/57 (07/31 0515) Pulse Rate: 72 (07/31 0515)  Labs: Recent Labs    03/10/24 1215 03/10/24 1415 03/11/24 0422  03/11/24 1140 03/12/24 0444 03/12/24 1641 03/13/24 0430  HGB 14.7  --  13.0  --  12.0*  --  11.7*  HCT 44.4  --  37.6*  --  36.9*  --  34.8*  PLT 193  --  157  --  127*  --  138*  HEPARINUNFRC  --   --   --    < > 0.40 0.49 0.41  CREATININE 0.68  --  0.75  --  0.71  --   --   TROPONINIHS 1,469* 1,157*  --   --   --   --   --    < > = values in this interval not displayed.    Estimated Creatinine Clearance: 100.1 mL/min (by C-G formula based on SCr of 0.71 mg/dL).   Assessment: 68 y.o. male with CAD s/p PTCA, awaiting CABG (scheduled for 03/17/24).   Heparin  level came back therapeutic at 0.41 on 1350 units/hr. Hgb 11s, plt 138. No s/sx of bleeding or infusion issues.   Goal of Therapy:  Heparin  level 0.3-0.5 units/ml (lower goal due to concomitant cangrelor ) Monitor platelets by anticoagulation protocol: Yes   Plan:  Continue heparin  1350 units/hr Monitor heparin  level, CBC, and signs of bleeding daily.   Thank you for allowing pharmacy to participate in this patient's care,  Amon Rocher, PharmD PGY-1 Pharmacy Resident [facility] 03/13/2024 7:09 AM

## 2024-03-13 NOTE — Progress Notes (Signed)
 Mobility Specialist Progress Note;   03/13/24 1140  Mobility  Activity Ambulated independently  Level of Assistance Modified independent, requires aide device or extra time  Assistive Device Other (Comment) (IV pole)  Distance Ambulated (ft) 400 ft  Activity Response Tolerated well  Mobility Referral Yes  Mobility visit 1 Mobility  Mobility Specialist Start Time (ACUTE ONLY) 1140  Mobility Specialist Stop Time (ACUTE ONLY) 1154  Mobility Specialist Time Calculation (min) (ACUTE ONLY) 14 min   Pt eager for mobility. Required no physical assistance during ambulation, ModI. VSS throughout and no c/o when asked. Pt returned back to bed and left with all needs met, call bell in reach.   Lauraine Erm Mobility Specialist Please contact via SecureChat or [employer] [phone]

## 2024-03-13 NOTE — Progress Notes (Signed)
  Progress Note  Patient Name: Alan [name] Date of Encounter: 03/13/2024 [facility] Cardiologist: [name], MD   Interval Summary   Doing well, just did not sleep well last night No chest pain or shortness of breath  Vital Signs Vitals:   03/13/24 0433 03/13/24 0514 03/13/24 0515 03/13/24 0734  BP: 108/73  (!) 95/57 108/75  Pulse: 86 75 72 76  Resp: 18  18 16   Temp: 97.9 F (36.6 C)  98.3 F (36.8 C) 97.9 F (36.6 C)  TempSrc: Oral  Oral Oral  SpO2: 97% 96% 98% 96%  Weight:      Height:       Intake/Output Summary (Last 24 hours) at 03/13/2024 0800 Last data filed at 03/12/2024 1845 Gross per 24 hour  Intake 3756.14 ml  Output --  Net 3756.14 ml      03/11/2024    1:41 AM 03/10/2024   12:12 PM 03/08/2024    2:56 PM  Last 3 Weights  Weight (lbs) 174 lb 2.6 oz 174 lb 2.6 oz 175 lb  Weight (kg) 79 kg 79 kg 79.379 kg     Telemetry/ECG  Sinus rhythm, HR 70s - Personally Reviewed  Physical Exam  GEN: No acute distress.   Neck: No JVD Cardiac: RRR, no murmurs, rubs, or gallops.  Respiratory: Clear to auscultation bilaterally. GI: Soft, nontender, non-distended  MS: No edema  Assessment & Plan  Multivessel CAD Ischemic cardiomyopathy  Hyperlipidemia  LHC 7/28 showed: 99% LAD in-stent restenosis, 60% residual stenosis after balloon angioplasty, mid-LAD 80% stenosis with 50% residual post balloon, possible ostial LCX 60-70% stenosis, RCA with ulcerated plaque and 70% stenosis, remained of RCA with mild diffuse disease Echo showed: EF 30-35%, LV RWMA, normal RV function  Right radial access site healing well, minimal bruising, no hematoma  03/10/2024: ALT 19 03/12/2024: HDL 40; LDL Cholesterol 58  Seen by CVTS who are planning for CABG on 8/4 after Effient  wash out Currently on ASA 81 mg daily Currently on Lipitor 80 mg daily  Currently on IV cangrelor   Currently on IV heparin    Metastatic NSCLC  Diagnosed in 2020 with mets to bones in 2023   Treated with palliative radiotherapy  Follows with oncology Stable on recent scans, no known active disease   For questions or updates, please contact  [facility] Please consult [email or web address] for contact info under       Signed, Waddell DELENA Donath, PA-C

## 2024-03-14 DIAGNOSIS — I214 Non-ST elevation (NSTEMI) myocardial infarction: Secondary | ICD-10-CM | POA: Diagnosis not present

## 2024-03-14 LAB — BASIC METABOLIC PANEL WITH GFR
Anion gap: 8 (ref 5–15)
BUN: 9 mg/dL (ref 8–23)
CO2: 23 mmol/L (ref 22–32)
Calcium: 9.1 mg/dL (ref 8.9–10.3)
Chloride: 111 mmol/L (ref 98–111)
Creatinine, Ser: 0.78 mg/dL (ref 0.61–1.24)
GFR, Estimated: >60 mL/min (ref >60)
Glucose, Bld: 95 mg/dL (ref 70–99)
Potassium: 3.4 mmol/L — ABNORMAL LOW (ref 3.5–5.1)
Sodium: 142 mmol/L (ref 135–145)

## 2024-03-14 LAB — CBC
HCT: 33.9 % — ABNORMAL LOW (ref 39.0–52.0)
Hemoglobin: 11.5 g/dL — ABNORMAL LOW (ref 13.0–17.0)
MCH: 31.7 pg (ref 26.0–34.0)
MCHC: 33.9 g/dL (ref 30.0–36.0)
MCV: 93.4 fL (ref 80.0–100.0)
Platelets: 144 K/uL — ABNORMAL LOW (ref 150–400)
RBC: 3.63 MIL/uL — ABNORMAL LOW (ref 4.22–5.81)
RDW: 12.6 % (ref 11.5–15.5)
WBC: 4.2 K/uL (ref 4.0–10.5)
nRBC: 0 % (ref 0.0–0.2)

## 2024-03-14 LAB — HEPARIN LEVEL (UNFRACTIONATED): Heparin Unfractionated: 0.32 [IU]/mL (ref 0.30–0.70)

## 2024-03-14 LAB — SURGICAL PCR SCREEN
MRSA, PCR: NEGATIVE
Staphylococcus aureus: NEGATIVE

## 2024-03-14 MED ORDER — MAGNESIUM SULFATE 50 % IJ SOLN
40.0000 meq | INTRAMUSCULAR | Status: DC
  Filled 2024-03-14: qty 9.85

## 2024-03-14 MED ORDER — PLASMA-LYTE A IV SOLN
INTRAVENOUS | Status: DC
  Filled 2024-03-14: qty 2.5

## 2024-03-14 MED ORDER — DEXMEDETOMIDINE HCL IN NACL 400 MCG/100ML IV SOLN
0.1000 ug/kg/h | INTRAVENOUS | Status: AC
  Administered 2024-03-17: .3 ug/kg/h via INTRAVENOUS
  Filled 2024-03-14 (×2): qty 100

## 2024-03-14 MED ORDER — VANCOMYCIN HCL 1250 MG/250ML IV SOLN
1250.0000 mg | INTRAVENOUS | Status: DC
  Filled 2024-03-14: qty 250

## 2024-03-14 MED ORDER — TRANEXAMIC ACID (OHS) BOLUS VIA INFUSION
15.0000 mg/kg | INTRAVENOUS | Status: AC
  Administered 2024-03-17: 1185 mg via INTRAVENOUS
  Filled 2024-03-14: qty 1185

## 2024-03-14 MED ORDER — TRANEXAMIC ACID (OHS) PUMP PRIME SOLUTION
2.0000 mg/kg | INTRAVENOUS | Status: DC
  Filled 2024-03-14: qty 1.58

## 2024-03-14 MED ORDER — NITROGLYCERIN IN D5W 200-5 MCG/ML-% IV SOLN
2.0000 ug/min | INTRAVENOUS | Status: DC
  Filled 2024-03-14: qty 250

## 2024-03-14 MED ORDER — TRANEXAMIC ACID 1000 MG/10ML IV SOLN
1.5000 mg/kg/h | INTRAVENOUS | Status: AC
  Administered 2024-03-17: 1.5 mg/kg/h via INTRAVENOUS
  Filled 2024-03-14: qty 25

## 2024-03-14 MED ORDER — NOREPINEPHRINE 4 MG/250ML-% IV SOLN
0.0000 ug/min | INTRAVENOUS | Status: AC
  Administered 2024-03-17: 2 ug/min via INTRAVENOUS
  Filled 2024-03-14: qty 250

## 2024-03-14 MED ORDER — EPINEPHRINE HCL 5 MG/250ML IV SOLN IN NS
0.0000 ug/min | INTRAVENOUS | Status: AC
  Administered 2024-03-17: 2 ug/min via INTRAVENOUS
  Filled 2024-03-14: qty 250

## 2024-03-14 MED ORDER — LEVOFLOXACIN IN D5W 500 MG/100ML IV SOLN
500.0000 mg | INTRAVENOUS | Status: AC
  Administered 2024-03-17: 500 mg via INTRAVENOUS
  Filled 2024-03-14: qty 100

## 2024-03-14 MED ORDER — MILRINONE LACTATE IN DEXTROSE 20-5 MG/100ML-% IV SOLN
0.3000 ug/kg/min | INTRAVENOUS | Status: AC
  Administered 2024-03-17: .375 ug/kg/min via INTRAVENOUS
  Filled 2024-03-14: qty 100

## 2024-03-14 MED ORDER — INSULIN REGULAR(HUMAN) IN NACL 100-0.9 UT/100ML-% IV SOLN
INTRAVENOUS | Status: AC
  Administered 2024-03-17: 1 [IU]/h via INTRAVENOUS
  Filled 2024-03-14: qty 100

## 2024-03-14 MED ORDER — POTASSIUM CHLORIDE CRYS ER 20 MEQ PO TBCR
30.0000 meq | EXTENDED_RELEASE_TABLET | Freq: Two times a day (BID) | ORAL | Status: AC
  Administered 2024-03-14 (×2): 30 meq via ORAL
  Filled 2024-03-14 (×2): qty 1

## 2024-03-14 MED ORDER — PHENYLEPHRINE HCL-NACL 20-0.9 MG/250ML-% IV SOLN
30.0000 ug/min | INTRAVENOUS | Status: AC
  Administered 2024-03-17: 15 ug/min via INTRAVENOUS
  Administered 2024-03-17: 45 ug/min via INTRAVENOUS
  Filled 2024-03-14: qty 250

## 2024-03-14 MED ORDER — POTASSIUM CHLORIDE 2 MEQ/ML IV SOLN
80.0000 meq | INTRAVENOUS | Status: DC
  Filled 2024-03-14: qty 40

## 2024-03-14 MED ORDER — HEPARIN 30,000 UNITS/1000 ML (OHS) CELLSAVER SOLUTION
Status: DC
  Filled 2024-03-14: qty 1000

## 2024-03-14 NOTE — Progress Notes (Signed)
 PHARMACY - ANTICOAGULATION  Pharmacy Consult for heparin  Indication: multivessel CAD   Allergies  Allergen Reactions   A-Cillin [Ampicillin] Shortness Of Breath, Swelling and Rash    Did it involve swelling of the face/tongue/throat, SOB, or low BP? Yes Did it involve sudden or severe rash/hives, skin peeling, or any reaction on the inside of your mouth or nose? Yes Did you need to seek medical attention at a hospital or doctor's office? Yes When did it last happen? ~20 years ago If all above answers are NO, may proceed with cephalosporin use.     Amoxicillin Hives, Shortness Of Breath and Swelling    Did it involve swelling of the face/tongue/throat, SOB, or low BP? Yes Did it involve sudden or severe rash/hives, skin peeling, or any reaction on the inside of your mouth or nose? Yes Did you need to seek medical attention at a hospital or doctor's office? Yes When did it last happen? ~20 years ago If all above answers are NO, may proceed with cephalosporin use.    Other Hives, Shortness Of Breath and Swelling    ALL CILLINS   Penicillins Hives, Shortness Of Breath and Swelling    Did it involve swelling of the face/tongue/throat, SOB, or low BP? Yes Did it involve sudden or severe rash/hives, skin peeling, or any reaction on the inside of your mouth or nose? Yes Did you need to seek medical attention at a hospital or doctor's office? Yes When did it last happen? ~20 years ago If all above answers are NO, may proceed with cephalosporin use.     Sulfa Antibiotics Nausea Only   Crestor  [Rosuvastatin ] Other (See Comments)    G I upset    Patient Measurements: Height: 6' 4 (193 cm) Weight: 79 kg (174 lb 2.6 oz) IBW/kg (Calculated) : 86.8 HEPARIN  DW (KG): 79  Vital Signs: Temp: 97.7 F (36.5 C) (08/01 0455) Temp Source: Oral (08/01 0455) BP: 113/75 (08/01 0455) Pulse Rate: 92 (08/01 0455)  Labs: Recent Labs    03/12/24 0444 03/12/24 1641 03/13/24 0430  03/14/24 0456  HGB 12.0*  --  11.7* 11.5*  HCT 36.9*  --  34.8* 33.9*  PLT 127*  --  138* 144*  HEPARINUNFRC 0.40 0.49 0.41 0.32  CREATININE 0.71  --   --  0.78    Estimated Creatinine Clearance: 100.1 mL/min (by C-G formula based on SCr of 0.78 mg/dL).   Assessment: 68 y.o. male with multivessel CAD s/p cath now awaiting CABG (scheduled for 03/17/24).   Heparin  level 0.32 is at low end of therapeutic on 1350 units/hr. Level is trending down.  No issues with infusion or bleeding per RN.  Goal of Therapy:  Heparin  level 0.3-0.5 units/ml (lower goal due to concomitant cangrelor ) Monitor platelets by anticoagulation protocol: Yes   Plan:  Increase heparin  to 1400 units/hr to keep in goal  Monitor heparin  level, CBC, and signs of bleeding daily.   Thank you for allowing pharmacy to participate in this patient's care,  Jinnie Door, PharmD, BCPS, [facility] Clinical Pharmacist  Please check AMION for all [facility] Pharmacy phone numbers After 10:00 PM, call Main Pharmacy [phone]

## 2024-03-14 NOTE — Progress Notes (Signed)
  Progress Note  Patient Name: Alan [name] Date of Encounter: 03/14/2024 [facility] Cardiologist: [name], MD   Interval Summary   Doing well, no acute complaints Eating good No issues with ambulation Potassium low today, supplementation ordered   Vital Signs Vitals:   03/13/24 1141 03/13/24 1652 03/13/24 2007 03/14/24 0455  BP: 97/72 115/76 121/79 113/75  Pulse: 83 83 95 92  Resp: 16 16 18 18   Temp: (!) 97.5 F (36.4 C) 98.8 F (37.1 C) 98.6 F (37 C) 97.7 F (36.5 C)  TempSrc: Oral Oral Oral Oral  SpO2:   97% 98%  Weight:      Height:        Intake/Output Summary (Last 24 hours) at 03/14/2024 0815 Last data filed at 03/14/2024 0501 Gross per 24 hour  Intake 1731.68 ml  Output --  Net 1731.68 ml      03/11/2024    1:41 AM 03/10/2024   12:12 PM 03/08/2024    2:56 PM  Last 3 Weights  Weight (lbs) 174 lb 2.6 oz 174 lb 2.6 oz 175 lb  Weight (kg) 79 kg 79 kg 79.379 kg     Telemetry/ECG  Sinus rhythm, HR 80s - Personally Reviewed  Physical Exam  GEN: No acute distress.   Neck: No JVD Cardiac: RRR, no murmurs, rubs, or gallops.  Respiratory: Clear to auscultation bilaterally. GI: Soft, nontender, non-distended  MS: No edema  Assessment & Plan  Multivessel CAD Ischemic cardiomyopathy  Hyperlipidemia  LHC 7/28 showed: 99% LAD in-stent restenosis, 60% residual stenosis after balloon angioplasty, mid-LAD 80% stenosis with 50% residual post balloon, possible ostial LCX 60-70% stenosis, RCA with ulcerated plaque and 70% stenosis, remained of RCA with mild diffuse disease Echo showed: EF 30-35%, LV RWMA, normal RV function  Right radial access site healing well, minimal bruising, no hematoma  03/10/2024: ALT 19 03/12/2024: HDL 40; LDL Cholesterol 58  Seen by CVTS who are planning for CABG on 8/4 after Effient  wash out Currently on ASA 81 mg daily Currently on Lipitor 80 mg daily  Currently on IV cangrelor   Currently on IV heparin    Metastatic  NSCLC  Diagnosed in 2020 with mets to bones in 2023  Treated with palliative radiotherapy  Follows with oncology Stable on recent scans, no known active disease   For questions or updates, please contact [facility] Please consult [email or web address] for contact info under       Signed, Waddell DELENA Donath, PA-C

## 2024-03-14 NOTE — TOC Initial Note (Signed)
 Transition of Care [facility]) - Initial/Assessment Note    Patient Details  Name: Alan [name] MRN: [id number] Date of Birth: 1955-10-09  Transition of Care [facility]) CM/SW Contact:    Sudie Erminio Deems, RN Phone Number: 03/14/2024, 12:12 PM  Clinical Narrative: Patient presented for chest pain-Nstemi-post LHC and plan for CABG 03-17-24. PTA patient was independent from home with spouse. Patient states he may have a rolling walker in his attic. Patient states he has a PCP and has transportation to appointments. Case Manager will continue to follow for transition of care needs as the patient progresses.     Expected Discharge Plan: Home w Home Health Services Barriers to Discharge: Continued Medical Work up   Patient Goals and CMS Choice Patient states their goals for this hospitalization and ongoing recovery are:: plan to return home once stable          Expected Discharge Plan and Services In-house Referral: NA Discharge Planning Services: CM Consult Post Acute Care Choice: Home Health Living arrangements for the past 2 months: Single Family Home                   DME Agency: NA                  Prior Living Arrangements/Services Living arrangements for the past 2 months: Single Family Home Lives with:: Spouse Patient language and need for interpreter reviewed:: Yes Do you feel safe going back to the place where you live?: Yes      Need for Family Participation in Patient Care: Yes (Comment)     Criminal Activity/Legal Involvement Pertinent to Current Situation/Hospitalization: No - Comment as needed  Activities of Daily Living   ADL Screening (condition at time of admission) Independently performs ADLs?: Yes (appropriate for developmental age) Is the patient deaf or have difficulty hearing?: No Does the patient have difficulty seeing, even when wearing glasses/contacts?: No Does the patient have difficulty concentrating, remembering, or making decisions?:  No  Permission Sought/Granted Permission sought to share information with : Family Supports, Case Manager                Emotional Assessment Appearance:: Appears stated age Attitude/Demeanor/Rapport: Engaged Affect (typically observed): Appropriate Orientation: : Oriented to Self, Oriented to Place, Oriented to  Time, Oriented to Situation Alcohol / Substance Use: Not Applicable Psych Involvement: No (comment)  Admission diagnosis:  NSTEMI (non-ST elevated myocardial infarction) (HCC) [I21.4] Patient Active Problem List   Diagnosis Date Noted   NSTEMI (non-ST elevated myocardial infarction) (HCC) 03/10/2024   Metastasis to bone (HCC) 11/14/2021   Acid reflux 10/29/2019   Preop cardiovascular exam 08/13/2019   SVT (supraventricular tachycardia) (HCC) 07/01/2019   Elevated troponin level 07/01/2019   Adenocarcinoma of right lung, stage 3 (HCC) 05/29/2019   Goals of care, counseling/discussion 05/29/2019   Encounter for antineoplastic chemotherapy 05/29/2019   Lung mass    S/P minimally invasive mitral valve repair 04/08/2014   Incidental lung nodule, greater than or equal to 8mm 03/16/2014   CAD S/P percutaneous coronary angioplasty - DES PCI to occluded LAD during anterior STEMI 11/29/2013   History of Severe Mitral valve prolapse - severe prolapse of the posterior (P2) leaflet with severe MR 11/29/2013   Essential hypertension    Dyslipidemia, goal LDL below 70    Enthesopathy of ankle and tarsus 03/24/2009   PCP:  Leonel Cole, MD Pharmacy:   [id number] - [place], [place] - [address] [place] [address] Phone: [phone] Fax: [phone]     Social Drivers of Health (SDOH) Social History: SDOH Screenings   Food Insecurity: No Food Insecurity (03/11/2024)  Housing: Low Risk  (03/11/2024)  Transportation Needs: No Transportation Needs (03/11/2024)  Utilities: Not At Risk (03/11/2024)   Depression (PHQ2-9): Low Risk  (10/05/2022)  Social Connections: Socially Integrated (03/10/2024)  Tobacco Use: Low Risk  (03/10/2024)   SDOH Interventions:     Readmission Risk Interventions     No data to display

## 2024-03-15 DIAGNOSIS — I214 Non-ST elevation (NSTEMI) myocardial infarction: Secondary | ICD-10-CM | POA: Diagnosis not present

## 2024-03-15 LAB — HEPARIN LEVEL (UNFRACTIONATED): Heparin Unfractionated: 0.36 [IU]/mL (ref 0.30–0.70)

## 2024-03-15 LAB — CBC
HCT: 37 % — ABNORMAL LOW (ref 39.0–52.0)
Hemoglobin: 12.4 g/dL — ABNORMAL LOW (ref 13.0–17.0)
MCH: 31.6 pg (ref 26.0–34.0)
MCHC: 33.5 g/dL (ref 30.0–36.0)
MCV: 94.4 fL (ref 80.0–100.0)
Platelets: 156 K/uL (ref 150–400)
RBC: 3.92 MIL/uL — ABNORMAL LOW (ref 4.22–5.81)
RDW: 12.5 % (ref 11.5–15.5)
WBC: 4.8 K/uL (ref 4.0–10.5)
nRBC: 0 % (ref 0.0–0.2)

## 2024-03-15 MED ORDER — TRAZODONE HCL 50 MG PO TABS
50.0000 mg | ORAL_TABLET | Freq: Every day | ORAL | Status: DC
  Administered 2024-03-15 – 2024-03-16 (×2): 50 mg via ORAL
  Filled 2024-03-15 (×2): qty 1

## 2024-03-15 NOTE — Progress Notes (Signed)
  Progress Note  Patient Name: Alan [name] Date of Encounter: 03/15/2024 [facility] Cardiologist: [name], MD   Interval Summary    No angina some lose stools   Vital Signs Vitals:   03/14/24 1950 03/15/24 0500 03/15/24 0817 03/15/24 0826  BP: 108/77 99/71 103/73 103/73  Pulse: 83 90 88 88  Resp: 17 16    Temp: 98.2 F (36.8 C) 98 F (36.7 C) 97.6 F (36.4 C) 97.7 F (36.5 C)  TempSrc: Oral Oral Oral Oral  SpO2:  98% 95% 98%  Weight:      Height:        Intake/Output Summary (Last 24 hours) at 03/15/2024 1015 Last data filed at 03/15/2024 9362 Gross per 24 hour  Intake 1048.42 ml  Output --  Net 1048.42 ml      03/11/2024    1:41 AM 03/10/2024   12:12 PM 03/08/2024    2:56 PM  Last 3 Weights  Weight (lbs) 174 lb 2.6 oz 174 lb 2.6 oz 175 lb  Weight (kg) 79 kg 79 kg 79.379 kg     Telemetry/ECG  Sinus rhythm, HR 80s - Personally Reviewed  Physical Exam  GEN: No acute distress.   Neck: No JVD Cardiac: RRR, no murmurs, rubs, or gallops.  Respiratory: Clear to auscultation bilaterally. GI: Soft, nontender, non-distended  MS: No edema  Assessment & Plan  Multivessel CAD Ischemic cardiomyopathy  Hyperlipidemia  LHC 7/28 showed: 99% LAD in-stent restenosis, 60% residual stenosis after balloon angioplasty, mid-LAD 80% stenosis with 50% residual post balloon, possible ostial LCX 60-70% stenosis, RCA with ulcerated plaque and 70% stenosis, remained of RCA with mild diffuse disease Echo showed: EF 30-35%, LV RWMA, normal RV function  Right radial access site healing well, minimal bruising, no hematoma  03/10/2024: ALT 19 03/12/2024: HDL 40; LDL Cholesterol 58  Seen by CVTS who are planning for CABG on 8/4 after Effient  wash out Currently on ASA 81 mg daily Currently on Lipitor 80 mg daily  Currently on IV cangrelor   Currently on IV heparin    Metastatic NSCLC  Diagnosed in 2020 with mets to bones in 2023  Treated with palliative radiotherapy   Follows with oncology Stable on recent scans, no known active disease   For questions or updates, please contact [facility] Please consult [email or web address] for contact info under       Signed, Maude Emmer, MD

## 2024-03-15 NOTE — Progress Notes (Signed)
 PHARMACY - ANTICOAGULATION  Pharmacy Consult for heparin  Indication: multivessel CAD   Allergies  Allergen Reactions   A-Cillin [Ampicillin] Shortness Of Breath, Swelling and Rash    Did it involve swelling of the face/tongue/throat, SOB, or low BP? Yes Did it involve sudden or severe rash/hives, skin peeling, or any reaction on the inside of your mouth or nose? Yes Did you need to seek medical attention at a hospital or doctor's office? Yes When did it last happen? ~20 years ago If all above answers are NO, may proceed with cephalosporin use.     Amoxicillin Hives, Shortness Of Breath and Swelling    Did it involve swelling of the face/tongue/throat, SOB, or low BP? Yes Did it involve sudden or severe rash/hives, skin peeling, or any reaction on the inside of your mouth or nose? Yes Did you need to seek medical attention at a hospital or doctor's office? Yes When did it last happen? ~20 years ago If all above answers are NO, may proceed with cephalosporin use.    Other Hives, Shortness Of Breath and Swelling    ALL CILLINS   Penicillins Hives, Shortness Of Breath and Swelling    Did it involve swelling of the face/tongue/throat, SOB, or low BP? Yes Did it involve sudden or severe rash/hives, skin peeling, or any reaction on the inside of your mouth or nose? Yes Did you need to seek medical attention at a hospital or doctor's office? Yes When did it last happen? ~20 years ago If all above answers are NO, may proceed with cephalosporin use.     Sulfa Antibiotics Nausea Only   Crestor  [Rosuvastatin ] Other (See Comments)    G I upset    Patient Measurements: Height: 6' 4 (193 cm) Weight: 79 kg (174 lb 2.6 oz) IBW/kg (Calculated) : 86.8 HEPARIN  DW (KG): 79  Vital Signs: Temp: 98 F (36.7 C) (08/02 0500) Temp Source: Oral (08/02 0500) BP: 99/71 (08/02 0500) Pulse Rate: 90 (08/02 0500)  Labs: Recent Labs    03/13/24 0430 03/14/24 0456 03/15/24 0438  HGB 11.7*  11.5* 12.4*  HCT 34.8* 33.9* 37.0*  PLT 138* 144* 156  HEPARINUNFRC 0.41 0.32 0.36  CREATININE  --  0.78  --     Estimated Creatinine Clearance: 100.1 mL/min (by C-G formula based on SCr of 0.78 mg/dL).   Assessment: 68 y.o. male with multivessel CAD s/p cath now awaiting CABG (scheduled for 03/17/24).   Heparin  level is therapeutic at 0.36 on 1400 units/hr.  Hgb and plts are stable. No issues with infusion or bleeding noted.  Goal of Therapy:  Heparin  level 0.3-0.5 units/ml (lower goal due to concomitant cangrelor ) Monitor platelets by anticoagulation protocol: Yes   Plan:  Continue heparin  to 1400 units/hr  Monitor heparin  level, CBC, and signs of bleeding daily.   Thank you for allowing pharmacy to participate in this patient's care,  B. Amon Rocher, PharmD PGY-1 Pharmacy Resident [facility] 03/15/2024 7:33 AM

## 2024-03-16 ENCOUNTER — Inpatient Hospital Stay (HOSPITAL_COMMUNITY)

## 2024-03-16 DIAGNOSIS — I214 Non-ST elevation (NSTEMI) myocardial infarction: Secondary | ICD-10-CM | POA: Diagnosis not present

## 2024-03-16 LAB — CBC
HCT: 36.3 % — ABNORMAL LOW (ref 39.0–52.0)
Hemoglobin: 12.3 g/dL — ABNORMAL LOW (ref 13.0–17.0)
MCH: 32 pg (ref 26.0–34.0)
MCHC: 33.9 g/dL (ref 30.0–36.0)
MCV: 94.5 fL (ref 80.0–100.0)
Platelets: 159 K/uL (ref 150–400)
RBC: 3.84 MIL/uL — ABNORMAL LOW (ref 4.22–5.81)
RDW: 12.5 % (ref 11.5–15.5)
WBC: 4.7 K/uL (ref 4.0–10.5)
nRBC: 0 % (ref 0.0–0.2)

## 2024-03-16 LAB — BLOOD GAS, ARTERIAL
Acid-Base Excess: 0.3 mmol/L (ref 0.0–2.0)
Bicarbonate: 23.5 mmol/L (ref 20.0–28.0)
Drawn by: 41875
O2 Saturation: 99 %
Patient temperature: 37
pCO2 arterial: 33 mmHg (ref 32–48)
pH, Arterial: 7.46 — ABNORMAL HIGH (ref 7.35–7.45)
pO2, Arterial: 97 mmHg (ref 83–108)

## 2024-03-16 LAB — URINALYSIS, ROUTINE W REFLEX MICROSCOPIC
Bacteria, UA: NONE SEEN
Bilirubin Urine: NEGATIVE
Glucose, UA: NEGATIVE mg/dL
Ketones, ur: NEGATIVE mg/dL
Leukocytes,Ua: NEGATIVE
Nitrite: NEGATIVE
Protein, ur: NEGATIVE mg/dL
Specific Gravity, Urine: 1.02 (ref 1.005–1.030)
pH: 5 (ref 5.0–8.0)

## 2024-03-16 LAB — PROTIME-INR
INR: 1 (ref 0.8–1.2)
Prothrombin Time: 14.1 s (ref 11.4–15.2)

## 2024-03-16 LAB — APTT: aPTT: 92 s — ABNORMAL HIGH (ref 24–36)

## 2024-03-16 LAB — HEPARIN LEVEL (UNFRACTIONATED): Heparin Unfractionated: 0.38 [IU]/mL (ref 0.30–0.70)

## 2024-03-16 MED ORDER — BISACODYL 5 MG PO TBEC
5.0000 mg | DELAYED_RELEASE_TABLET | Freq: Once | ORAL | Status: AC
  Administered 2024-03-16: 5 mg via ORAL
  Filled 2024-03-16: qty 1

## 2024-03-16 MED ORDER — CHLORHEXIDINE GLUCONATE CLOTH 2 % EX PADS
6.0000 | MEDICATED_PAD | Freq: Once | CUTANEOUS | Status: AC
  Administered 2024-03-17: 6 via TOPICAL

## 2024-03-16 MED ORDER — METOPROLOL TARTRATE 12.5 MG HALF TABLET
12.5000 mg | ORAL_TABLET | Freq: Once | ORAL | Status: AC
  Administered 2024-03-17: 12.5 mg via ORAL
  Filled 2024-03-16: qty 1

## 2024-03-16 MED ORDER — CHLORHEXIDINE GLUCONATE 0.12 % MT SOLN
15.0000 mL | Freq: Once | OROMUCOSAL | Status: AC
  Administered 2024-03-17: 15 mL via OROMUCOSAL
  Filled 2024-03-16: qty 15

## 2024-03-16 MED ORDER — CHLORHEXIDINE GLUCONATE CLOTH 2 % EX PADS
6.0000 | MEDICATED_PAD | Freq: Once | CUTANEOUS | Status: AC
  Administered 2024-03-16: 6 via TOPICAL

## 2024-03-16 NOTE — Anesthesia Preprocedure Evaluation (Signed)
 Anesthesia Evaluation  Patient identified by MRN, date of birth, ID band Patient awake    Reviewed: Allergy & Precautions, NPO status , Patient's Chart, lab work & pertinent test results  History of Anesthesia Complications (+) PONV and history of anesthetic complications  Airway Mallampati: III  TM Distance: <3 FB Neck ROM: Full    Dental  (+) Dental Advisory Given, Teeth Intact   Pulmonary   Lung cancer    Pulmonary exam normal        Cardiovascular hypertension, + CAD, + Past MI and + Cardiac Stents  Normal cardiovascular exam+ dysrhythmias Supra Ventricular Tachycardia + Valvular Problems/Murmurs    S/p Mini-mitral 2015  '25 TTE - EF 30 to 35%. The mid and distal anterior septum, mid inferoseptal segment, apical anterior segment, apical inferior segment, and apex are akinetic. The anterior wall, mid and distal lateral wall, inferior wall, posterior wall, basal anteroseptal segment, mid anterolateral segment, and basal inferoseptal segment are hypokinetic. RV systolic function mildly decreased. The mitral valve has been repaired/replaced. Trivial mitral valve regurgitation. The mean mitral valve gradient is 4.0 mmHg with average heart rate of 83 bpm. There is a 34 Sorin Memo 3D Reochord Ring prosthetic annuloplasty ring present in the mitral position. Echo findings are consistent with normal structure and function of the mitral valve prosthesis. Trivial AI.  '25 Cath -  Multivessel disease:  Likely culprit is 99% eccentric fibrotic/calcified ISR of ostial-proximal LAD stent from 2004-2005 with TIMI 0 flow to the apex, also noted focal mid LAD 80% stenosis just prior to small diagonal branch  Difficult PTCA of ISR starting with a 2.0 mm balloon upsizing to 2.5 mm flex followed by 3.5 mm flex inflated to high atmospheres reducing the stenosis to ~50 to 60%, and PTCA only of the mid LAD 80% stenosis using a 2.0 mm balloon reducing  to 50% stenosis.  Questionable ostial LCx-in some images appears to be widely patent another images appear to be at least 60 to 70% stenosed.  (Will need IVUS or FFR wire assessment).  The main to the LCx is free of any disease has a small OM1 branch and then terminates as a LPL 1.  RCA with apparent ulcerated plaque at a small proximal branch followed by a focal concentric heavily calcified 70% stenosis.  The remainder of the RCA has mild diffuse disease and terminates as a large near wraparound PDA with 2 major PL branches and 1 minor branch.  Mild diffuse disease.  Normal LVEDP      Neuro/Psych negative neurological ROS  negative psych ROS   GI/Hepatic Neg liver ROS,GERD  Medicated and Controlled,,  Endo/Other  negative endocrine ROS    Renal/GU negative Renal ROS     Musculoskeletal negative musculoskeletal ROS (+)   Bone metastases    Abdominal   Peds  Hematology  (+) Blood dyscrasia, anemia   Anesthesia Other Findings   Reproductive/Obstetrics                              Anesthesia Physical Anesthesia Plan  ASA: 4  Anesthesia Plan: General   Post-op Pain Management:    Induction: Intravenous  PONV Risk Score and Plan: 3 and Treatment may vary due to age or medical condition and Ondansetron   Airway Management Planned: Oral ETT  Additional Equipment: Arterial line, CVP, PA Cath, TEE and Ultrasound Guidance Line Placement  Intra-op Plan:   Post-operative Plan: Post-operative intubation/ventilation  Informed Consent:  I have reviewed the patients History and Physical, chart, labs and discussed the procedure including the risks, benefits and alternatives for the proposed anesthesia with the patient or authorized representative who has indicated his/her understanding and acceptance.     Dental advisory given  Plan Discussed with: CRNA and Anesthesiologist  Anesthesia Plan Comments:          Anesthesia Quick  Evaluation

## 2024-03-16 NOTE — Progress Notes (Signed)
 6 Days Post-Op Procedure(s) (LRB): LEFT HEART CATH AND CORONARY ANGIOGRAPHY (N/A) CORONARY BALLOON ANGIOPLASTY (N/A) Subjective: No complaints  Objective: Vital signs in last 24 hours: Temp:  [97.5 F (36.4 C)-98.7 F (37.1 C)] 97.5 F (36.4 C) (08/03 0743) Pulse Rate:  [84-93] 84 (08/03 0743) Cardiac Rhythm: Normal sinus rhythm (08/03 0700) Resp:  [15-18] 16 (08/03 0743) BP: (92-116)/(63-81) 116/81 (08/03 0743) SpO2:  [97 %-100 %] 97 % (08/03 0743)  Hemodynamic parameters for last 24 hours:    Intake/Output from previous day: 08/02 0701 - 08/03 0700 In: 691.5 [I.V.:691.5] Out: -  Intake/Output this shift: Total I/O In: 400 [P.O.:400] Out: -   General appearance: alert, cooperative, and no distress Neurologic: intact Heart: regular rate and rhythm  Lab Results: Recent Labs    03/15/24 0438 03/16/24 0410  WBC 4.8 4.7  HGB 12.4* 12.3*  HCT 37.0* 36.3*  PLT 156 159   BMET:  Recent Labs    03/14/24 0456  NA 142  K 3.4*  CL 111  CO2 23  GLUCOSE 95  BUN 9  CREATININE 0.78  CALCIUM  9.1    PT/INR:  Recent Labs    03/16/24 0410  LABPROT 14.1  INR 1.0   ABG    Component Value Date/Time   PHART 7.311 (L) 04/09/2014 0222   HCO3 21.0 04/09/2014 0222   TCO2 20 04/09/2014 1602   ACIDBASEDEF 5.0 (H) 04/09/2014 0222   O2SAT 95.0 04/09/2014 0222   CBG (last 3)  No results for input(s): GLUCAP in the last 72 hours.  Assessment/Plan: S/P Procedure(s) (LRB): LEFT HEART CATH AND CORONARY ANGIOGRAPHY (N/A) CORONARY BALLOON ANGIOPLASTY (N/A) 3 vessel CAD For CABG tomorrow All questions answered   LOS: 6 days    Alan [name] 03/16/2024

## 2024-03-16 NOTE — Progress Notes (Signed)
 PHARMACY - ANTICOAGULATION  Pharmacy Consult for heparin  Indication: multivessel CAD   Allergies  Allergen Reactions   A-Cillin [Ampicillin] Shortness Of Breath, Swelling and Rash    Did it involve swelling of the face/tongue/throat, SOB, or low BP? Yes Did it involve sudden or severe rash/hives, skin peeling, or any reaction on the inside of your mouth or nose? Yes Did you need to seek medical attention at a hospital or doctor's office? Yes When did it last happen? ~20 years ago If all above answers are NO, may proceed with cephalosporin use.     Amoxicillin Hives, Shortness Of Breath and Swelling    Did it involve swelling of the face/tongue/throat, SOB, or low BP? Yes Did it involve sudden or severe rash/hives, skin peeling, or any reaction on the inside of your mouth or nose? Yes Did you need to seek medical attention at a hospital or doctor's office? Yes When did it last happen? ~20 years ago If all above answers are NO, may proceed with cephalosporin use.    Other Hives, Shortness Of Breath and Swelling    ALL CILLINS   Penicillins Hives, Shortness Of Breath and Swelling    Did it involve swelling of the face/tongue/throat, SOB, or low BP? Yes Did it involve sudden or severe rash/hives, skin peeling, or any reaction on the inside of your mouth or nose? Yes Did you need to seek medical attention at a hospital or doctor's office? Yes When did it last happen? ~20 years ago If all above answers are NO, may proceed with cephalosporin use.     Sulfa Antibiotics Nausea Only   Crestor  [Rosuvastatin ] Other (See Comments)    G I upset    Patient Measurements: Height: 6' 4 (193 cm) Weight: 79 kg (174 lb 2.6 oz) IBW/kg (Calculated) : 86.8 HEPARIN  DW (KG): 79  Vital Signs: Temp: 98 F (36.7 C) (08/03 0410) Temp Source: Oral (08/03 0410) BP: 92/63 (08/03 0410) Pulse Rate: 89 (08/03 0410)  Labs: Recent Labs    03/14/24 0456 03/15/24 0438 03/16/24 0410  HGB 11.5*  12.4* 12.3*  HCT 33.9* 37.0* 36.3*  PLT 144* 156 159  APTT  --   --  92*  LABPROT  --   --  14.1  INR  --   --  1.0  HEPARINUNFRC 0.32 0.36 0.38  CREATININE 0.78  --   --     Estimated Creatinine Clearance: 100.1 mL/min (by C-G formula based on SCr of 0.78 mg/dL).   Assessment: 68 y.o. male with multivessel CAD s/p cath now awaiting CABG (scheduled for 03/17/24).   Heparin  level is therapeutic at 0.38 on 1400 units/hr.  Hgb and plts are stable. No issues with infusion or bleeding noted.  Goal of Therapy:  Heparin  level 0.3-0.5 units/ml (lower goal due to concomitant cangrelor ) Monitor platelets by anticoagulation protocol: Yes   Plan:  Continue heparin  to 1400 units/hr  Monitor heparin  level, CBC, and signs of bleeding daily.   Thank you for allowing pharmacy to participate in this patient's care,  B. Amon Rocher, PharmD PGY-1 Pharmacy Resident [facility] 03/16/2024 7:35 AM

## 2024-03-16 NOTE — Progress Notes (Signed)
  Progress Note  Patient Name: Alan [name] Date of Encounter: 03/16/2024 [facility] Cardiologist: [name], MD   Interval Summary    No angina some lose stools   Vital Signs Vitals:   03/16/24 0113 03/16/24 0410 03/16/24 0743 03/16/24 0810  BP: 92/66 92/63 116/81   Pulse: 93 89 84 99  Resp: 15 16 16    Temp: 97.8 F (36.6 C) 98 F (36.7 C) (!) 97.5 F (36.4 C)   TempSrc: Oral Oral Oral   SpO2:  100% 97% 97%  Weight:      Height:        Intake/Output Summary (Last 24 hours) at 03/16/2024 1024 Last data filed at 03/16/2024 0849 Gross per 24 hour  Intake 1091.51 ml  Output --  Net 1091.51 ml      03/11/2024    1:41 AM 03/10/2024   12:12 PM 03/08/2024    2:56 PM  Last 3 Weights  Weight (lbs) 174 lb 2.6 oz 174 lb 2.6 oz 175 lb  Weight (kg) 79 kg 79 kg 79.379 kg     Telemetry/ECG  Sinus rhythm, HR 80s - Personally Reviewed  Physical Exam  GEN: No acute distress.   Neck: No JVD Cardiac: RRR, no murmurs, rubs, or gallops.  Respiratory: Clear to auscultation bilaterally. GI: Soft, nontender, non-distended  MS: No edema  Assessment & Plan  Multivessel CAD Ischemic cardiomyopathy  Hyperlipidemia  Seen by Dr Kerrin this am. [facility] 7/28 showed: 99% LAD in-stent restenosis, 60% residual stenosis after balloon angioplasty, mid-LAD 80% stenosis with 50% residual post balloon, possible ostial LCX 60-70% stenosis, RCA with ulcerated plaque and 70% stenosis, remained of RCA with mild diffuse disease Echo showed: EF 30-35%, LV RWMA, normal RV function  Right radial access site healing well, minimal bruising, no hematoma  03/10/2024: ALT 19 03/12/2024: HDL 40; LDL Cholesterol 58  Seen by CVTS who are planning for CABG on 8/4 after Effient  wash out Currently on ASA 81 mg daily Currently on Lipitor 80 mg daily  Currently on IV cangrelor   Currently on IV heparin    Metastatic NSCLC  Diagnosed in 2020 with mets to bones in 2023  Treated with palliative  radiotherapy  Follows with oncology Stable on recent scans, no known active disease  Insomnia:   Started on Trazadone 50 mg at bedtime last night   For questions or updates, please contact [facility] Please consult [email or web address] for contact info under       Signed, Maude Emmer, MD

## 2024-03-17 ENCOUNTER — Other Ambulatory Visit: Payer: Self-pay

## 2024-03-17 ENCOUNTER — Inpatient Hospital Stay (HOSPITAL_COMMUNITY)

## 2024-03-17 ENCOUNTER — Inpatient Hospital Stay (HOSPITAL_COMMUNITY): Payer: Self-pay | Admitting: Certified Registered Nurse Anesthetist

## 2024-03-17 ENCOUNTER — Encounter (HOSPITAL_COMMUNITY): Payer: Self-pay | Admitting: Cardiovascular Disease

## 2024-03-17 ENCOUNTER — Inpatient Hospital Stay (HOSPITAL_COMMUNITY)
Admission: EM | Disposition: A | Discharge status: Other Acute Inpatient Hospital | Payer: Self-pay | Source: Ambulatory Visit | Attending: Thoracic Surgery (Cardiothoracic Vascular Surgery)

## 2024-03-17 DIAGNOSIS — I252 Old myocardial infarction: Secondary | ICD-10-CM | POA: Diagnosis not present

## 2024-03-17 DIAGNOSIS — J90 Pleural effusion, not elsewhere classified: Secondary | ICD-10-CM | POA: Diagnosis not present

## 2024-03-17 DIAGNOSIS — R579 Shock, unspecified: Secondary | ICD-10-CM

## 2024-03-17 DIAGNOSIS — I2510 Atherosclerotic heart disease of native coronary artery without angina pectoris: Secondary | ICD-10-CM

## 2024-03-17 DIAGNOSIS — I214 Non-ST elevation (NSTEMI) myocardial infarction: Secondary | ICD-10-CM | POA: Diagnosis not present

## 2024-03-17 DIAGNOSIS — I471 Supraventricular tachycardia, unspecified: Secondary | ICD-10-CM

## 2024-03-17 DIAGNOSIS — D62 Acute posthemorrhagic anemia: Secondary | ICD-10-CM | POA: Diagnosis not present

## 2024-03-17 DIAGNOSIS — I7101 Dissection of ascending aorta: Secondary | ICD-10-CM

## 2024-03-17 DIAGNOSIS — I10 Essential (primary) hypertension: Secondary | ICD-10-CM

## 2024-03-17 DIAGNOSIS — Z951 Presence of aortocoronary bypass graft: Secondary | ICD-10-CM

## 2024-03-17 HISTORY — PX: INTRAOPERATIVE TRANSESOPHAGEAL ECHOCARDIOGRAM: SHX5062

## 2024-03-17 HISTORY — PX: REPAIR OF ACUTE ASCENDING THORACIC AORTIC DISSECTION: SHX6323

## 2024-03-17 HISTORY — PX: CORONARY ARTERY BYPASS GRAFT: SHX141

## 2024-03-17 LAB — CBC
HCT: 39.4 % (ref 39.0–52.0)
HCT: 24.2 % — ABNORMAL LOW (ref 39.0–52.0)
HCT: 26.2 % — ABNORMAL LOW (ref 39.0–52.0)
HCT: 25.8 % — ABNORMAL LOW (ref 39.0–52.0)
HCT: 27.8 % — ABNORMAL LOW (ref 39.0–52.0)
HCT: 27.3 % — ABNORMAL LOW (ref 39.0–52.0)
Hemoglobin: 12.9 g/dL — ABNORMAL LOW (ref 13.0–17.0)
Hemoglobin: 8.2 g/dL — ABNORMAL LOW (ref 13.0–17.0)
Hemoglobin: 9.1 g/dL — ABNORMAL LOW (ref 13.0–17.0)
Hemoglobin: 8.9 g/dL — ABNORMAL LOW (ref 13.0–17.0)
Hemoglobin: 9.5 g/dL — ABNORMAL LOW (ref 13.0–17.0)
Hemoglobin: 9.4 g/dL — ABNORMAL LOW (ref 13.0–17.0)
MCH: 31.3 pg (ref 26.0–34.0)
MCH: 30.6 pg (ref 26.0–34.0)
MCH: 31 pg (ref 26.0–34.0)
MCH: 30.6 pg (ref 26.0–34.0)
MCH: 29.8 pg (ref 26.0–34.0)
MCH: 30.2 pg (ref 26.0–34.0)
MCHC: 32.7 g/dL (ref 30.0–36.0)
MCHC: 33.9 g/dL (ref 30.0–36.0)
MCHC: 34.7 g/dL (ref 30.0–36.0)
MCHC: 34.5 g/dL (ref 30.0–36.0)
MCHC: 34.2 g/dL (ref 30.0–36.0)
MCHC: 34.4 g/dL (ref 30.0–36.0)
MCV: 95.6 fL (ref 80.0–100.0)
MCV: 90.3 fL (ref 80.0–100.0)
MCV: 89.1 fL (ref 80.0–100.0)
MCV: 88.7 fL (ref 80.0–100.0)
MCV: 87.1 fL (ref 80.0–100.0)
MCV: 87.8 fL (ref 80.0–100.0)
Platelets: 187 K/uL (ref 150–400)
Platelets: 100 K/uL — ABNORMAL LOW (ref 150–400)
Platelets: 89 K/uL — ABNORMAL LOW (ref 150–400)
Platelets: 126 K/uL — ABNORMAL LOW (ref 150–400)
Platelets: 162 K/uL (ref 150–400)
Platelets: 155 K/uL (ref 150–400)
RBC: 4.12 MIL/uL — ABNORMAL LOW (ref 4.22–5.81)
RBC: 2.68 MIL/uL — ABNORMAL LOW (ref 4.22–5.81)
RBC: 2.94 MIL/uL — ABNORMAL LOW (ref 4.22–5.81)
RBC: 2.91 MIL/uL — ABNORMAL LOW (ref 4.22–5.81)
RBC: 3.19 MIL/uL — ABNORMAL LOW (ref 4.22–5.81)
RBC: 3.11 MIL/uL — ABNORMAL LOW (ref 4.22–5.81)
RDW: 12.5 % (ref 11.5–15.5)
RDW: 16.4 % — ABNORMAL HIGH (ref 11.5–15.5)
RDW: 16 % — ABNORMAL HIGH (ref 11.5–15.5)
RDW: 16.2 % — ABNORMAL HIGH (ref 11.5–15.5)
RDW: 16.4 % — ABNORMAL HIGH (ref 11.5–15.5)
RDW: 16.5 % — ABNORMAL HIGH (ref 11.5–15.5)
WBC: 5.1 K/uL (ref 4.0–10.5)
WBC: 11.6 K/uL — ABNORMAL HIGH (ref 4.0–10.5)
WBC: 12.1 K/uL — ABNORMAL HIGH (ref 4.0–10.5)
WBC: 12.2 K/uL — ABNORMAL HIGH (ref 4.0–10.5)
WBC: 13.7 K/uL — ABNORMAL HIGH (ref 4.0–10.5)
WBC: 13.3 K/uL — ABNORMAL HIGH (ref 4.0–10.5)
nRBC: 0 % (ref 0.0–0.2)
nRBC: 0 % (ref 0.0–0.2)
nRBC: 0 % (ref 0.0–0.2)
nRBC: 0 % (ref 0.0–0.2)
nRBC: 0 % (ref 0.0–0.2)
nRBC: 0 % (ref 0.0–0.2)

## 2024-03-17 LAB — POCT I-STAT 7, (LYTES, BLD GAS, ICA,H+H)
Acid-Base Excess: 0 mmol/L (ref 0.0–2.0)
Acid-base deficit: 3 mmol/L — ABNORMAL HIGH (ref 0.0–2.0)
Acid-base deficit: 3 mmol/L — ABNORMAL HIGH (ref 0.0–2.0)
Acid-base deficit: 1 mmol/L (ref 0.0–2.0)
Acid-base deficit: 6 mmol/L — ABNORMAL HIGH (ref 0.0–2.0)
Acid-base deficit: 3 mmol/L — ABNORMAL HIGH (ref 0.0–2.0)
Acid-base deficit: 4 mmol/L — ABNORMAL HIGH (ref 0.0–2.0)
Acid-base deficit: 4 mmol/L — ABNORMAL HIGH (ref 0.0–2.0)
Acid-base deficit: 4 mmol/L — ABNORMAL HIGH (ref 0.0–2.0)
Acid-base deficit: 5 mmol/L — ABNORMAL HIGH (ref 0.0–2.0)
Acid-base deficit: 4 mmol/L — ABNORMAL HIGH (ref 0.0–2.0)
Acid-base deficit: 3 mmol/L — ABNORMAL HIGH (ref 0.0–2.0)
Acid-base deficit: 3 mmol/L — ABNORMAL HIGH (ref 0.0–2.0)
Bicarbonate: 22.3 mmol/L (ref 20.0–28.0)
Bicarbonate: 22.7 mmol/L (ref 20.0–28.0)
Bicarbonate: 23.4 mmol/L (ref 20.0–28.0)
Bicarbonate: 20.4 mmol/L (ref 20.0–28.0)
Bicarbonate: 24.9 mmol/L (ref 20.0–28.0)
Bicarbonate: 21.4 mmol/L (ref 20.0–28.0)
Bicarbonate: 21.5 mmol/L (ref 20.0–28.0)
Bicarbonate: 21.6 mmol/L (ref 20.0–28.0)
Bicarbonate: 20.9 mmol/L (ref 20.0–28.0)
Bicarbonate: 21.1 mmol/L (ref 20.0–28.0)
Bicarbonate: 20.8 mmol/L (ref 20.0–28.0)
Bicarbonate: 21.6 mmol/L (ref 20.0–28.0)
Bicarbonate: 20.7 mmol/L (ref 20.0–28.0)
Calcium, Ion: 1.3 mmol/L (ref 1.15–1.40)
Calcium, Ion: 1.08 mmol/L — ABNORMAL LOW (ref 1.15–1.40)
Calcium, Ion: 0.98 mmol/L — ABNORMAL LOW (ref 1.15–1.40)
Calcium, Ion: 0.72 mmol/L — CL (ref 1.15–1.40)
Calcium, Ion: 0.78 mmol/L — CL (ref 1.15–1.40)
Calcium, Ion: 0.79 mmol/L — CL (ref 1.15–1.40)
Calcium, Ion: 0.86 mmol/L — CL (ref 1.15–1.40)
Calcium, Ion: 1.02 mmol/L — ABNORMAL LOW (ref 1.15–1.40)
Calcium, Ion: 0.91 mmol/L — ABNORMAL LOW (ref 1.15–1.40)
Calcium, Ion: 1.22 mmol/L (ref 1.15–1.40)
Calcium, Ion: 1.15 mmol/L (ref 1.15–1.40)
Calcium, Ion: 1.07 mmol/L — ABNORMAL LOW (ref 1.15–1.40)
Calcium, Ion: 1.14 mmol/L — ABNORMAL LOW (ref 1.15–1.40)
HCT: 35 % — ABNORMAL LOW (ref 39.0–52.0)
HCT: 26 % — ABNORMAL LOW (ref 39.0–52.0)
HCT: 22 % — ABNORMAL LOW (ref 39.0–52.0)
HCT: 17 % — ABNORMAL LOW (ref 39.0–52.0)
HCT: 20 % — ABNORMAL LOW (ref 39.0–52.0)
HCT: 22 % — ABNORMAL LOW (ref 39.0–52.0)
HCT: 23 % — ABNORMAL LOW (ref 39.0–52.0)
HCT: 26 % — ABNORMAL LOW (ref 39.0–52.0)
HCT: 22 % — ABNORMAL LOW (ref 39.0–52.0)
HCT: 26 % — ABNORMAL LOW (ref 39.0–52.0)
HCT: 23 % — ABNORMAL LOW (ref 39.0–52.0)
HCT: 21 % — ABNORMAL LOW (ref 39.0–52.0)
HCT: 24 % — ABNORMAL LOW (ref 39.0–52.0)
Hemoglobin: 11.9 g/dL — ABNORMAL LOW (ref 13.0–17.0)
Hemoglobin: 8.8 g/dL — ABNORMAL LOW (ref 13.0–17.0)
Hemoglobin: 7.5 g/dL — ABNORMAL LOW (ref 13.0–17.0)
Hemoglobin: 5.8 g/dL — CL (ref 13.0–17.0)
Hemoglobin: 6.8 g/dL — CL (ref 13.0–17.0)
Hemoglobin: 7.5 g/dL — ABNORMAL LOW (ref 13.0–17.0)
Hemoglobin: 7.8 g/dL — ABNORMAL LOW (ref 13.0–17.0)
Hemoglobin: 8.8 g/dL — ABNORMAL LOW (ref 13.0–17.0)
Hemoglobin: 7.5 g/dL — ABNORMAL LOW (ref 13.0–17.0)
Hemoglobin: 8.8 g/dL — ABNORMAL LOW (ref 13.0–17.0)
Hemoglobin: 7.8 g/dL — ABNORMAL LOW (ref 13.0–17.0)
Hemoglobin: 7.1 g/dL — ABNORMAL LOW (ref 13.0–17.0)
Hemoglobin: 8.2 g/dL — ABNORMAL LOW (ref 13.0–17.0)
O2 Saturation: 100 %
O2 Saturation: 100 %
O2 Saturation: 100 %
O2 Saturation: 100 %
O2 Saturation: 100 %
O2 Saturation: 100 %
O2 Saturation: 100 %
O2 Saturation: 99 %
O2 Saturation: 93 %
O2 Saturation: 99 %
O2 Saturation: 97 %
O2 Saturation: 98 %
O2 Saturation: 96 %
Patient temperature: 34.5
Patient temperature: 34.9
Patient temperature: 35.3
Patient temperature: 37.1
Potassium: 3.6 mmol/L (ref 3.5–5.1)
Potassium: 3.5 mmol/L (ref 3.5–5.1)
Potassium: 5.3 mmol/L — ABNORMAL HIGH (ref 3.5–5.1)
Potassium: 4.5 mmol/L (ref 3.5–5.1)
Potassium: 5.2 mmol/L — ABNORMAL HIGH (ref 3.5–5.1)
Potassium: 5.9 mmol/L — ABNORMAL HIGH (ref 3.5–5.1)
Potassium: 4 mmol/L (ref 3.5–5.1)
Potassium: 4.9 mmol/L (ref 3.5–5.1)
Potassium: 3.5 mmol/L (ref 3.5–5.1)
Potassium: 4 mmol/L (ref 3.5–5.1)
Potassium: 3.7 mmol/L (ref 3.5–5.1)
Potassium: 3.6 mmol/L (ref 3.5–5.1)
Potassium: 3.6 mmol/L (ref 3.5–5.1)
Sodium: 142 mmol/L (ref 135–145)
Sodium: 140 mmol/L (ref 135–145)
Sodium: 140 mmol/L (ref 135–145)
Sodium: 138 mmol/L (ref 135–145)
Sodium: 141 mmol/L (ref 135–145)
Sodium: 141 mmol/L (ref 135–145)
Sodium: 143 mmol/L (ref 135–145)
Sodium: 145 mmol/L (ref 135–145)
Sodium: 144 mmol/L (ref 135–145)
Sodium: 145 mmol/L (ref 135–145)
Sodium: 146 mmol/L — ABNORMAL HIGH (ref 135–145)
Sodium: 146 mmol/L — ABNORMAL HIGH (ref 135–145)
Sodium: 144 mmol/L (ref 135–145)
TCO2: 24 mmol/L (ref 22–32)
TCO2: 24 mmol/L (ref 22–32)
TCO2: 24 mmol/L (ref 22–32)
TCO2: 22 mmol/L (ref 22–32)
TCO2: 26 mmol/L (ref 22–32)
TCO2: 22 mmol/L (ref 22–32)
TCO2: 23 mmol/L (ref 22–32)
TCO2: 23 mmol/L (ref 22–32)
TCO2: 22 mmol/L (ref 22–32)
TCO2: 22 mmol/L (ref 22–32)
TCO2: 22 mmol/L (ref 22–32)
TCO2: 23 mmol/L (ref 22–32)
TCO2: 22 mmol/L (ref 22–32)
pCO2 arterial: 41 mmHg (ref 32–48)
pCO2 arterial: 41.5 mmHg (ref 32–48)
pCO2 arterial: 34.4 mmHg (ref 32–48)
pCO2 arterial: 45.6 mmHg (ref 32–48)
pCO2 arterial: 40.7 mmHg (ref 32–48)
pCO2 arterial: 34 mmHg (ref 32–48)
pCO2 arterial: 39.9 mmHg (ref 32–48)
pCO2 arterial: 42.6 mmHg (ref 32–48)
pCO2 arterial: 36.3 mmHg (ref 32–48)
pCO2 arterial: 40 mmHg (ref 32–48)
pCO2 arterial: 32.5 mmHg (ref 32–48)
pCO2 arterial: 32.9 mmHg (ref 32–48)
pCO2 arterial: 32.7 mmHg (ref 32–48)
pH, Arterial: 7.344 — ABNORMAL LOW (ref 7.35–7.45)
pH, Arterial: 7.346 — ABNORMAL LOW (ref 7.35–7.45)
pH, Arterial: 7.44 (ref 7.35–7.45)
pH, Arterial: 7.259 — ABNORMAL LOW (ref 7.35–7.45)
pH, Arterial: 7.395 (ref 7.35–7.45)
pH, Arterial: 7.407 (ref 7.35–7.45)
pH, Arterial: 7.312 — ABNORMAL LOW (ref 7.35–7.45)
pH, Arterial: 7.342 — ABNORMAL LOW (ref 7.35–7.45)
pH, Arterial: 7.331 — ABNORMAL LOW (ref 7.35–7.45)
pH, Arterial: 7.355 (ref 7.35–7.45)
pH, Arterial: 7.405 (ref 7.35–7.45)
pH, Arterial: 7.418 (ref 7.35–7.45)
pH, Arterial: 7.409 (ref 7.35–7.45)
pO2, Arterial: 376 mmHg — ABNORMAL HIGH (ref 83–108)
pO2, Arterial: 286 mmHg — ABNORMAL HIGH (ref 83–108)
pO2, Arterial: 300 mmHg — ABNORMAL HIGH (ref 83–108)
pO2, Arterial: 324 mmHg — ABNORMAL HIGH (ref 83–108)
pO2, Arterial: 347 mmHg — ABNORMAL HIGH (ref 83–108)
pO2, Arterial: 234 mmHg — ABNORMAL HIGH (ref 83–108)
pO2, Arterial: 171 mmHg — ABNORMAL HIGH (ref 83–108)
pO2, Arterial: 201 mmHg — ABNORMAL HIGH (ref 83–108)
pO2, Arterial: 161 mmHg — ABNORMAL HIGH (ref 83–108)
pO2, Arterial: 62 mmHg — ABNORMAL LOW (ref 83–108)
pO2, Arterial: 83 mmHg (ref 83–108)
pO2, Arterial: 94 mmHg (ref 83–108)
pO2, Arterial: 80 mmHg — ABNORMAL LOW (ref 83–108)

## 2024-03-17 LAB — POCT I-STAT EG7
Acid-base deficit: 2 mmol/L (ref 0.0–2.0)
Bicarbonate: 23.8 mmol/L (ref 20.0–28.0)
Calcium, Ion: 1.09 mmol/L — ABNORMAL LOW (ref 1.15–1.40)
HCT: 26 % — ABNORMAL LOW (ref 39.0–52.0)
Hemoglobin: 8.8 g/dL — ABNORMAL LOW (ref 13.0–17.0)
O2 Saturation: 89 %
Potassium: 3.5 mmol/L (ref 3.5–5.1)
Sodium: 141 mmol/L (ref 135–145)
TCO2: 25 mmol/L (ref 22–32)
pCO2, Ven: 44.5 mmHg (ref 44–60)
pH, Ven: 7.336 (ref 7.25–7.43)
pO2, Ven: 61 mmHg — ABNORMAL HIGH (ref 32–45)

## 2024-03-17 LAB — POCT I-STAT, CHEM 8
BUN: 13 mg/dL (ref 8–23)
BUN: 13 mg/dL (ref 8–23)
BUN: 11 mg/dL (ref 8–23)
BUN: 11 mg/dL (ref 8–23)
BUN: 11 mg/dL (ref 8–23)
BUN: 11 mg/dL (ref 8–23)
BUN: 11 mg/dL (ref 8–23)
BUN: 11 mg/dL (ref 8–23)
BUN: 9 mg/dL (ref 8–23)
Calcium, Ion: 1.31 mmol/L (ref 1.15–1.40)
Calcium, Ion: 1.3 mmol/L (ref 1.15–1.40)
Calcium, Ion: 0.96 mmol/L — ABNORMAL LOW (ref 1.15–1.40)
Calcium, Ion: 1.03 mmol/L — ABNORMAL LOW (ref 1.15–1.40)
Calcium, Ion: 0.81 mmol/L — CL (ref 1.15–1.40)
Calcium, Ion: 0.77 mmol/L — CL (ref 1.15–1.40)
Calcium, Ion: 0.79 mmol/L — CL (ref 1.15–1.40)
Calcium, Ion: 1.03 mmol/L — ABNORMAL LOW (ref 1.15–1.40)
Calcium, Ion: 0.91 mmol/L — ABNORMAL LOW (ref 1.15–1.40)
Chloride: 106 mmol/L (ref 98–111)
Chloride: 106 mmol/L (ref 98–111)
Chloride: 103 mmol/L (ref 98–111)
Chloride: 105 mmol/L (ref 98–111)
Chloride: 107 mmol/L (ref 98–111)
Chloride: 102 mmol/L (ref 98–111)
Chloride: 106 mmol/L (ref 98–111)
Chloride: 107 mmol/L (ref 98–111)
Chloride: 106 mmol/L (ref 98–111)
Creatinine, Ser: 0.6 mg/dL — ABNORMAL LOW (ref 0.61–1.24)
Creatinine, Ser: 0.6 mg/dL — ABNORMAL LOW (ref 0.61–1.24)
Creatinine, Ser: 0.5 mg/dL — ABNORMAL LOW (ref 0.61–1.24)
Creatinine, Ser: 0.5 mg/dL — ABNORMAL LOW (ref 0.61–1.24)
Creatinine, Ser: 0.6 mg/dL — ABNORMAL LOW (ref 0.61–1.24)
Creatinine, Ser: 0.6 mg/dL — ABNORMAL LOW (ref 0.61–1.24)
Creatinine, Ser: 0.5 mg/dL — ABNORMAL LOW (ref 0.61–1.24)
Creatinine, Ser: 0.5 mg/dL — ABNORMAL LOW (ref 0.61–1.24)
Creatinine, Ser: 0.5 mg/dL — ABNORMAL LOW (ref 0.61–1.24)
Glucose, Bld: 73 mg/dL (ref 70–99)
Glucose, Bld: 114 mg/dL — ABNORMAL HIGH (ref 70–99)
Glucose, Bld: 109 mg/dL — ABNORMAL HIGH (ref 70–99)
Glucose, Bld: 117 mg/dL — ABNORMAL HIGH (ref 70–99)
Glucose, Bld: 216 mg/dL — ABNORMAL HIGH (ref 70–99)
Glucose, Bld: 224 mg/dL — ABNORMAL HIGH (ref 70–99)
Glucose, Bld: 179 mg/dL — ABNORMAL HIGH (ref 70–99)
Glucose, Bld: 157 mg/dL — ABNORMAL HIGH (ref 70–99)
Glucose, Bld: 165 mg/dL — ABNORMAL HIGH (ref 70–99)
HCT: 33 % — ABNORMAL LOW (ref 39.0–52.0)
HCT: 35 % — ABNORMAL LOW (ref 39.0–52.0)
HCT: 20 % — ABNORMAL LOW (ref 39.0–52.0)
HCT: 23 % — ABNORMAL LOW (ref 39.0–52.0)
HCT: 21 % — ABNORMAL LOW (ref 39.0–52.0)
HCT: 20 % — ABNORMAL LOW (ref 39.0–52.0)
HCT: 24 % — ABNORMAL LOW (ref 39.0–52.0)
HCT: 24 % — ABNORMAL LOW (ref 39.0–52.0)
HCT: 26 % — ABNORMAL LOW (ref 39.0–52.0)
Hemoglobin: 11.2 g/dL — ABNORMAL LOW (ref 13.0–17.0)
Hemoglobin: 11.9 g/dL — ABNORMAL LOW (ref 13.0–17.0)
Hemoglobin: 6.8 g/dL — CL (ref 13.0–17.0)
Hemoglobin: 7.8 g/dL — ABNORMAL LOW (ref 13.0–17.0)
Hemoglobin: 7.1 g/dL — ABNORMAL LOW (ref 13.0–17.0)
Hemoglobin: 6.8 g/dL — CL (ref 13.0–17.0)
Hemoglobin: 8.2 g/dL — ABNORMAL LOW (ref 13.0–17.0)
Hemoglobin: 8.2 g/dL — ABNORMAL LOW (ref 13.0–17.0)
Hemoglobin: 8.8 g/dL — ABNORMAL LOW (ref 13.0–17.0)
Potassium: 3.5 mmol/L (ref 3.5–5.1)
Potassium: 3.5 mmol/L (ref 3.5–5.1)
Potassium: 4.5 mmol/L (ref 3.5–5.1)
Potassium: 5.5 mmol/L — ABNORMAL HIGH (ref 3.5–5.1)
Potassium: 6.8 mmol/L (ref 3.5–5.1)
Potassium: 5.3 mmol/L — ABNORMAL HIGH (ref 3.5–5.1)
Potassium: 5.9 mmol/L — ABNORMAL HIGH (ref 3.5–5.1)
Potassium: 4.9 mmol/L (ref 3.5–5.1)
Potassium: 3.9 mmol/L (ref 3.5–5.1)
Sodium: 141 mmol/L (ref 135–145)
Sodium: 140 mmol/L (ref 135–145)
Sodium: 139 mmol/L (ref 135–145)
Sodium: 140 mmol/L (ref 135–145)
Sodium: 136 mmol/L (ref 135–145)
Sodium: 141 mmol/L (ref 135–145)
Sodium: 140 mmol/L (ref 135–145)
Sodium: 142 mmol/L (ref 135–145)
Sodium: 143 mmol/L (ref 135–145)
TCO2: 25 mmol/L (ref 22–32)
TCO2: 24 mmol/L (ref 22–32)
TCO2: 25 mmol/L (ref 22–32)
TCO2: 25 mmol/L (ref 22–32)
TCO2: 23 mmol/L (ref 22–32)
TCO2: 27 mmol/L (ref 22–32)
TCO2: 24 mmol/L (ref 22–32)
TCO2: 23 mmol/L (ref 22–32)
TCO2: 22 mmol/L (ref 22–32)

## 2024-03-17 LAB — GLUCOSE, CAPILLARY
Glucose-Capillary: 134 mg/dL — ABNORMAL HIGH (ref 70–99)
Glucose-Capillary: 153 mg/dL — ABNORMAL HIGH (ref 70–99)
Glucose-Capillary: 170 mg/dL — ABNORMAL HIGH (ref 70–99)
Glucose-Capillary: 173 mg/dL — ABNORMAL HIGH (ref 70–99)
Glucose-Capillary: 174 mg/dL — ABNORMAL HIGH (ref 70–99)
Glucose-Capillary: 174 mg/dL — ABNORMAL HIGH (ref 70–99)
Glucose-Capillary: 177 mg/dL — ABNORMAL HIGH (ref 70–99)
Glucose-Capillary: 181 mg/dL — ABNORMAL HIGH (ref 70–99)
Glucose-Capillary: 190 mg/dL — ABNORMAL HIGH (ref 70–99)
Glucose-Capillary: 197 mg/dL — ABNORMAL HIGH (ref 70–99)

## 2024-03-17 LAB — BPAM PLATELET PHERESIS
Blood Product Expiration Date: 202508052359
Blood Product Expiration Date: 202508072359
ISSUE DATE / TIME: 202508041702
Unit Type and Rh: 5100
Unit Type and Rh: 7300

## 2024-03-17 LAB — APTT: aPTT: 44 s — ABNORMAL HIGH (ref 24–36)

## 2024-03-17 LAB — MAGNESIUM: Magnesium: 2.7 mg/dL — ABNORMAL HIGH (ref 1.7–2.4)

## 2024-03-17 LAB — BASIC METABOLIC PANEL WITH GFR
Anion gap: 8 (ref 5–15)
Anion gap: 10 (ref 5–15)
BUN: 14 mg/dL (ref 8–23)
BUN: 12 mg/dL (ref 8–23)
CO2: 22 mmol/L (ref 22–32)
CO2: 21 mmol/L — ABNORMAL LOW (ref 22–32)
Calcium: 9.4 mg/dL (ref 8.9–10.3)
Calcium: 7.8 mg/dL — ABNORMAL LOW (ref 8.9–10.3)
Chloride: 109 mmol/L (ref 98–111)
Chloride: 112 mmol/L — ABNORMAL HIGH (ref 98–111)
Creatinine, Ser: 0.71 mg/dL (ref 0.61–1.24)
Creatinine, Ser: 0.93 mg/dL (ref 0.61–1.24)
GFR, Estimated: >60 mL/min (ref >60)
GFR, Estimated: >60 mL/min (ref >60)
Glucose, Bld: 94 mg/dL (ref 70–99)
Glucose, Bld: 170 mg/dL — ABNORMAL HIGH (ref 70–99)
Potassium: 3.7 mmol/L (ref 3.5–5.1)
Potassium: 3.6 mmol/L (ref 3.5–5.1)
Sodium: 139 mmol/L (ref 135–145)
Sodium: 143 mmol/L (ref 135–145)

## 2024-03-17 LAB — PREPARE PLATELET PHERESIS
Unit division: 0
Unit division: 0

## 2024-03-17 LAB — PREPARE RBC (CROSSMATCH)

## 2024-03-17 LAB — PROTIME-INR
INR: 2.3 — ABNORMAL HIGH (ref 0.8–1.2)
INR: 1.8 — ABNORMAL HIGH (ref 0.8–1.2)
Prothrombin Time: 26.5 s — ABNORMAL HIGH (ref 11.4–15.2)
Prothrombin Time: 21.5 s — ABNORMAL HIGH (ref 11.4–15.2)

## 2024-03-17 LAB — HEPARIN LEVEL (UNFRACTIONATED): Heparin Unfractionated: 0.4 [IU]/mL (ref 0.30–0.70)

## 2024-03-17 LAB — ECHO INTRAOPERATIVE TEE
Height: 76 in
Weight: 2786.61 [oz_av]

## 2024-03-17 LAB — HEMOGLOBIN AND HEMATOCRIT, BLOOD
HCT: 23.8 % — ABNORMAL LOW (ref 39.0–52.0)
HCT: 24.5 % — ABNORMAL LOW (ref 39.0–52.0)
Hemoglobin: 7.9 g/dL — ABNORMAL LOW (ref 13.0–17.0)
Hemoglobin: 8.3 g/dL — ABNORMAL LOW (ref 13.0–17.0)

## 2024-03-17 LAB — PLATELET COUNT
Platelets: 116 K/uL — ABNORMAL LOW (ref 150–400)
Platelets: 77 K/uL — ABNORMAL LOW (ref 150–400)

## 2024-03-17 LAB — FIBRINOGEN: Fibrinogen: 160 mg/dL — ABNORMAL LOW (ref 210–475)

## 2024-03-17 SURGERY — CORONARY ARTERY BYPASS GRAFTING (CABG)
Anesthesia: General | Site: Chest

## 2024-03-17 MED ORDER — PANTOPRAZOLE SODIUM 40 MG IV SOLR
40.0000 mg | Freq: Every day | INTRAVENOUS | Status: DC
  Administered 2024-03-17: 40 mg via INTRAVENOUS
  Filled 2024-03-17: qty 10

## 2024-03-17 MED ORDER — SODIUM BICARBONATE 8.4 % IV SOLN: 50.0000 meq | Freq: Once | INTRAVENOUS | Status: DC

## 2024-03-17 MED ORDER — EPINEPHRINE HCL 5 MG/250ML IV SOLN IN NS
0.0000 ug/min | INTRAVENOUS | Status: DC
  Filled 2024-03-17: qty 250

## 2024-03-17 MED ORDER — SODIUM CHLORIDE (PF) 0.9 % IJ SOLN: OROMUCOSAL | Status: DC | PRN

## 2024-03-17 MED ORDER — LACTATED RINGERS IV SOLN
INTRAVENOUS | Status: DC | PRN
Start: 2024-03-17 — End: 2024-03-17

## 2024-03-17 MED ORDER — ORAL CARE MOUTH RINSE: 15.0000 mL | Freq: Once | OROMUCOSAL | Status: AC

## 2024-03-17 MED ORDER — CHLORHEXIDINE GLUCONATE CLOTH 2 % EX PADS
6.0000 | MEDICATED_PAD | Freq: Every day | CUTANEOUS | Status: DC
  Administered 2024-03-18 – 2024-03-20 (×3): 6 via TOPICAL

## 2024-03-17 MED ORDER — ASPIRIN 81 MG PO CHEW
324.0000 mg | CHEWABLE_TABLET | Freq: Every day | ORAL | Status: DC
  Administered 2024-03-18 – 2024-04-01 (×18): 324 mg
  Filled 2024-03-17 (×15): qty 4

## 2024-03-17 MED ORDER — TRAMADOL HCL 50 MG PO TABS: 50.0000 mg | ORAL_TABLET | ORAL | Status: DC | PRN

## 2024-03-17 MED ORDER — ROCURONIUM BROMIDE 10 MG/ML (PF) SYRINGE
PREFILLED_SYRINGE | INTRAVENOUS | Status: AC
  Filled 2024-03-17: qty 10

## 2024-03-17 MED ORDER — AMIODARONE HCL IN DEXTROSE 360-4.14 MG/200ML-% IV SOLN
60.0000 mg/h | INTRAVENOUS | Status: DC
  Administered 2024-03-17: 60 mg/h via INTRAVENOUS
  Filled 2024-03-17: qty 200
  Filled 2024-03-17: qty 400

## 2024-03-17 MED ORDER — PROTAMINE SULFATE 10 MG/ML IV SOLN
INTRAVENOUS | Status: DC | PRN
Start: 2024-03-17 — End: 2024-03-17
  Administered 2024-03-17: 30 mg via INTRAVENOUS
  Administered 2024-03-17: 270 mg via INTRAVENOUS
  Administered 2024-03-17: 50 mg via INTRAVENOUS

## 2024-03-17 MED ORDER — VANCOMYCIN HCL IN DEXTROSE 1-5 GM/200ML-% IV SOLN
1000.0000 mg | Freq: Once | INTRAVENOUS | Status: AC
  Administered 2024-03-17: 1000 mg via INTRAVENOUS
  Filled 2024-03-17: qty 200

## 2024-03-17 MED ORDER — SODIUM CHLORIDE 0.9 % IV SOLN: INTRAVENOUS | Status: AC

## 2024-03-17 MED ORDER — PROTAMINE SULFATE 10 MG/ML IV SOLN
INTRAVENOUS | Status: AC
  Filled 2024-03-17: qty 5

## 2024-03-17 MED ORDER — ONDANSETRON HCL 4 MG/2ML IJ SOLN: 4.0000 mg | Freq: Four times a day (QID) | INTRAMUSCULAR | Status: DC | PRN

## 2024-03-17 MED ORDER — SODIUM CHLORIDE 0.45 % IV SOLN: INTRAVENOUS | Status: AC | PRN

## 2024-03-17 MED ORDER — METOPROLOL TARTRATE 12.5 MG HALF TABLET: 12.5000 mg | ORAL_TABLET | Freq: Two times a day (BID) | ORAL | Status: DC

## 2024-03-17 MED ORDER — HEMOSTATIC AGENTS (NO CHARGE) OPTIME
TOPICAL | Status: DC | PRN
Start: 2024-03-17 — End: 2024-03-17
  Administered 2024-03-17 (×3): 1 via TOPICAL

## 2024-03-17 MED ORDER — NOREPINEPHRINE 16 MG/250ML-% IV SOLN
0.0000 ug/min | INTRAVENOUS | Status: DC
  Administered 2024-03-17: 8 ug/min via INTRAVENOUS
  Administered 2024-03-18: 12 ug/min via INTRAVENOUS
  Filled 2024-03-17 (×2): qty 250

## 2024-03-17 MED ORDER — ALBUMIN HUMAN 5 % IV SOLN: INTRAVENOUS | Status: DC | PRN

## 2024-03-17 MED ORDER — PHENYLEPHRINE 80 MCG/ML (10ML) SYRINGE FOR IV PUSH (FOR BLOOD PRESSURE SUPPORT)
PREFILLED_SYRINGE | INTRAVENOUS | Status: AC
  Filled 2024-03-17: qty 10

## 2024-03-17 MED ORDER — MIDAZOLAM HCL 2 MG/2ML IJ SOLN
2.0000 mg | INTRAMUSCULAR | Status: DC | PRN
Start: 2024-03-17 — End: 2024-03-31
  Administered 2024-03-17 – 2024-03-20 (×10): 2 mg via INTRAVENOUS
  Filled 2024-03-17 (×12): qty 2

## 2024-03-17 MED ORDER — ASPIRIN 325 MG PO TBEC: 325.0000 mg | DELAYED_RELEASE_TABLET | Freq: Every day | ORAL | Status: DC

## 2024-03-17 MED ORDER — PANTOPRAZOLE SODIUM 40 MG PO TBEC: 40.0000 mg | DELAYED_RELEASE_TABLET | Freq: Every day | ORAL | Status: DC

## 2024-03-17 MED ORDER — MIDAZOLAM HCL (PF) 5 MG/ML IJ SOLN
INTRAMUSCULAR | Status: DC | PRN
  Administered 2024-03-17: 1 mg via INTRAVENOUS
  Administered 2024-03-17: 4 mg via INTRAVENOUS

## 2024-03-17 MED ORDER — POTASSIUM CHLORIDE 10 MEQ/50ML IV SOLN
10.0000 meq | INTRAVENOUS | Status: AC
  Administered 2024-03-17 (×3): 10 meq via INTRAVENOUS

## 2024-03-17 MED ORDER — LACTATED RINGERS IV SOLN: INTRAVENOUS | Status: DC | PRN

## 2024-03-17 MED ORDER — FENTANYL CITRATE (PF) 250 MCG/5ML IJ SOLN
INTRAMUSCULAR | Status: AC
Start: 2024-03-17 — End: 2024-03-17
  Filled 2024-03-17: qty 5

## 2024-03-17 MED ORDER — CHLORHEXIDINE GLUCONATE 0.12 % MT SOLN
15.0000 mL | Freq: Once | OROMUCOSAL | Status: AC
  Administered 2024-03-17: 15 mL via OROMUCOSAL

## 2024-03-17 MED ORDER — VASOPRESSIN 20 UNIT/ML IV SOLN
INTRAVENOUS | Status: AC
  Filled 2024-03-17: qty 1

## 2024-03-17 MED ORDER — OXYCODONE HCL 5 MG PO TABS
5.0000 mg | ORAL_TABLET | ORAL | Status: DC | PRN
  Administered 2024-03-17: 5 mg via ORAL
  Administered 2024-03-18: 10 mg via ORAL
  Filled 2024-03-17: qty 2
  Filled 2024-03-17: qty 1

## 2024-03-17 MED ORDER — HEPARIN SODIUM (PORCINE) 1000 UNIT/ML IJ SOLN
INTRAMUSCULAR | Status: AC
  Filled 2024-03-17: qty 1

## 2024-03-17 MED ORDER — CALCIUM CHLORIDE 10 % IV SOLN
INTRAVENOUS | Status: DC | PRN
  Administered 2024-03-17: 100 mg via INTRAVENOUS

## 2024-03-17 MED ORDER — METOPROLOL TARTRATE 25 MG/10 ML ORAL SUSPENSION
12.5000 mg | Freq: Two times a day (BID) | ORAL | Status: DC
  Filled 2024-03-17: qty 10

## 2024-03-17 MED ORDER — SODIUM CHLORIDE 0.9% IV SOLUTION: Freq: Once | INTRAVENOUS | Status: DC

## 2024-03-17 MED ORDER — METOCLOPRAMIDE HCL 5 MG/ML IJ SOLN
10.0000 mg | Freq: Four times a day (QID) | INTRAMUSCULAR | Status: DC
  Administered 2024-03-17 – 2024-03-18 (×2): 10 mg via INTRAVENOUS
  Filled 2024-03-17 (×3): qty 2

## 2024-03-17 MED ORDER — INSULIN REGULAR(HUMAN) IN NACL 100-0.9 UT/100ML-% IV SOLN
INTRAVENOUS | Status: DC
  Administered 2024-03-18: 4.2 [IU]/h via INTRAVENOUS
  Filled 2024-03-17: qty 100

## 2024-03-17 MED ORDER — 0.9 % SODIUM CHLORIDE (POUR BTL) OPTIME
TOPICAL | Status: DC | PRN
  Administered 2024-03-17 (×2): 1000 mL
  Administered 2024-03-17: 5000 mL

## 2024-03-17 MED ORDER — ALBUMIN HUMAN 5 % IV SOLN
250.0000 mL | INTRAVENOUS | Status: DC | PRN
  Administered 2024-03-17 (×2): 12.5 g via INTRAVENOUS
  Filled 2024-03-17: qty 250

## 2024-03-17 MED ORDER — PLASMA-LYTE A IV SOLN
INTRAVENOUS | Status: DC | PRN
  Administered 2024-03-17: 500 mL via INTRAVASCULAR

## 2024-03-17 MED ORDER — LACTATED RINGERS IV SOLN: INTRAVENOUS | Status: AC

## 2024-03-17 MED ORDER — METHYLPREDNISOLONE SODIUM SUCC 125 MG IJ SOLR
INTRAMUSCULAR | Status: DC | PRN
Start: 2024-03-17 — End: 2024-03-17
  Administered 2024-03-17: 125 mg via INTRAVENOUS

## 2024-03-17 MED ORDER — PROPOFOL 10 MG/ML IV BOLUS
INTRAVENOUS | Status: DC | PRN
  Administered 2024-03-17: 30 mg via INTRAVENOUS
  Administered 2024-03-17: 50 mg via INTRAVENOUS
  Administered 2024-03-17: 20 mg via INTRAVENOUS

## 2024-03-17 MED ORDER — ROCURONIUM BROMIDE 10 MG/ML (PF) SYRINGE
PREFILLED_SYRINGE | INTRAVENOUS | Status: AC
  Filled 2024-03-17: qty 20

## 2024-03-17 MED ORDER — CHLORHEXIDINE GLUCONATE CLOTH 2 % EX PADS
6.0000 | MEDICATED_PAD | Freq: Every day | CUTANEOUS | Status: DC
  Administered 2024-03-17 – 2024-03-31 (×17): 6 via TOPICAL

## 2024-03-17 MED ORDER — ORAL CARE MOUTH RINSE: 15.0000 mL | OROMUCOSAL | Status: DC | PRN

## 2024-03-17 MED ORDER — FENTANYL CITRATE (PF) 100 MCG/2ML IJ SOLN
INTRAMUSCULAR | Status: DC | PRN
  Administered 2024-03-17 (×3): 100 ug via INTRAVENOUS
  Administered 2024-03-17 (×2): 50 ug via INTRAVENOUS
  Administered 2024-03-17: 100 ug via INTRAVENOUS
  Administered 2024-03-17 (×2): 50 ug via INTRAVENOUS
  Administered 2024-03-17: 100 ug via INTRAVENOUS
  Administered 2024-03-17: 50 ug via INTRAVENOUS

## 2024-03-17 MED ORDER — CHLORHEXIDINE GLUCONATE 0.12 % MT SOLN
15.0000 mL | OROMUCOSAL | Status: AC
  Administered 2024-03-17: 15 mL via OROMUCOSAL
  Filled 2024-03-17: qty 15

## 2024-03-17 MED ORDER — ACETAMINOPHEN 500 MG PO TABS
1000.0000 mg | ORAL_TABLET | Freq: Four times a day (QID) | ORAL | Status: AC
  Administered 2024-03-18: 1000 mg via ORAL
  Filled 2024-03-17: qty 2

## 2024-03-17 MED ORDER — HEPARIN SODIUM (PORCINE) 1000 UNIT/ML IJ SOLN
INTRAMUSCULAR | Status: DC | PRN
  Administered 2024-03-17: 23000 [IU] via INTRAVENOUS
  Administered 2024-03-17: 2000 [IU] via INTRAVENOUS
  Administered 2024-03-17: 10000 [IU] via INTRAVENOUS

## 2024-03-17 MED ORDER — AMIODARONE HCL IN DEXTROSE 360-4.14 MG/200ML-% IV SOLN
30.0000 mg/h | INTRAVENOUS | Status: DC
  Administered 2024-03-18 – 2024-03-20 (×7): 30 mg/h via INTRAVENOUS
  Filled 2024-03-17 (×5): qty 200

## 2024-03-17 MED ORDER — BISACODYL 5 MG PO TBEC
10.0000 mg | DELAYED_RELEASE_TABLET | Freq: Every day | ORAL | Status: DC
  Administered 2024-03-18 – 2024-03-26 (×9): 10 mg via ORAL
  Filled 2024-03-17 (×6): qty 2

## 2024-03-17 MED ORDER — POTASSIUM CHLORIDE 20 MEQ PO PACK
20.0000 meq | PACK | ORAL | Status: AC
  Administered 2024-03-17 – 2024-03-18 (×3): 20 meq
  Filled 2024-03-17 (×3): qty 1

## 2024-03-17 MED ORDER — PROPOFOL 10 MG/ML IV BOLUS
INTRAVENOUS | Status: AC
  Filled 2024-03-17: qty 20

## 2024-03-17 MED ORDER — HEMOSTATIC AGENTS (NO CHARGE) OPTIME
TOPICAL | Status: DC | PRN
  Administered 2024-03-17 (×4): 1 via TOPICAL

## 2024-03-17 MED ORDER — CALCIUM CHLORIDE 10 % IV SOLN: 1.0000 g | Freq: Once | INTRAVENOUS | Status: DC

## 2024-03-17 MED ORDER — MAGNESIUM SULFATE 4 GM/100ML IV SOLN
4.0000 g | Freq: Once | INTRAVENOUS | Status: AC
  Administered 2024-03-17: 4 g via INTRAVENOUS
  Filled 2024-03-17: qty 100

## 2024-03-17 MED ORDER — VASOPRESSIN 20 UNITS/100 ML INFUSION FOR SHOCK
0.0000 [IU]/min | INTRAVENOUS | Status: DC
  Administered 2024-03-17: .03 [IU]/min via INTRAVENOUS
  Filled 2024-03-17: qty 100

## 2024-03-17 MED ORDER — DOCUSATE SODIUM 100 MG PO CAPS: 200.0000 mg | ORAL_CAPSULE | Freq: Every day | ORAL | Status: DC

## 2024-03-17 MED ORDER — NOREPINEPHRINE 4 MG/250ML-% IV SOLN
INTRAVENOUS | Status: AC
  Filled 2024-03-17: qty 250

## 2024-03-17 MED ORDER — IPRATROPIUM-ALBUTEROL 0.5-2.5 (3) MG/3ML IN SOLN: 3.0000 mL | RESPIRATORY_TRACT | Status: DC | PRN

## 2024-03-17 MED ORDER — SODIUM CHLORIDE 0.9% FLUSH
3.0000 mL | Freq: Two times a day (BID) | INTRAVENOUS | Status: DC
  Administered 2024-03-18 – 2024-04-01 (×27): 3 mL via INTRAVENOUS

## 2024-03-17 MED ORDER — PHENYLEPHRINE HCL-NACL 20-0.9 MG/250ML-% IV SOLN: 0.0000 ug/min | INTRAVENOUS | Status: DC

## 2024-03-17 MED ORDER — ORAL CARE MOUTH RINSE
15.0000 mL | OROMUCOSAL | Status: DC
  Administered 2024-03-17: 15 mL via OROMUCOSAL

## 2024-03-17 MED ORDER — NITROGLYCERIN 0.2 MG/ML ON CALL CATH LAB
INTRAVENOUS | Status: DC | PRN
  Administered 2024-03-17 (×2): 40 ug via INTRAVENOUS
  Administered 2024-03-17: 80 ug via INTRAVENOUS

## 2024-03-17 MED ORDER — NOREPINEPHRINE 4 MG/250ML-% IV SOLN: 0.0000 ug/min | INTRAVENOUS | Status: DC

## 2024-03-17 MED ORDER — EPINEPHRINE 1 MG/10ML IJ SOSY
PREFILLED_SYRINGE | INTRAMUSCULAR | Status: DC | PRN
  Administered 2024-03-17: 8 ug via INTRAVENOUS
  Administered 2024-03-17: 4 ug via INTRAVENOUS

## 2024-03-17 MED ORDER — MORPHINE SULFATE (PF) 2 MG/ML IV SOLN
1.0000 mg | INTRAVENOUS | Status: DC | PRN
  Administered 2024-03-17 – 2024-03-18 (×3): 2 mg via INTRAVENOUS
  Filled 2024-03-17 (×3): qty 1

## 2024-03-17 MED ORDER — SODIUM CHLORIDE 0.9 % IV SOLN
20.0000 ug | Freq: Once | INTRAVENOUS | Status: AC
  Administered 2024-03-17: 20 ug via INTRAVENOUS
  Filled 2024-03-17: qty 5

## 2024-03-17 MED ORDER — LEVOFLOXACIN IN D5W 750 MG/150ML IV SOLN
750.0000 mg | INTRAVENOUS | Status: AC
  Administered 2024-03-18: 750 mg via INTRAVENOUS
  Filled 2024-03-17: qty 150

## 2024-03-17 MED ORDER — LACTATED RINGERS IV SOLN: INTRAVENOUS | Status: DC

## 2024-03-17 MED ORDER — ROCURONIUM BROMIDE 100 MG/10ML IV SOLN
INTRAVENOUS | Status: DC | PRN
  Administered 2024-03-17 (×2): 50 mg via INTRAVENOUS
  Administered 2024-03-17: 100 mg via INTRAVENOUS
  Administered 2024-03-17: 50 mg via INTRAVENOUS

## 2024-03-17 MED ORDER — FENTANYL CITRATE (PF) 250 MCG/5ML IJ SOLN
INTRAMUSCULAR | Status: AC
  Filled 2024-03-17: qty 5

## 2024-03-17 MED ORDER — SODIUM CHLORIDE 0.9 % IV SOLN: INTRAVENOUS | Status: DC | PRN

## 2024-03-17 MED ORDER — ALBUTEROL SULFATE HFA 108 (90 BASE) MCG/ACT IN AERS
INHALATION_SPRAY | RESPIRATORY_TRACT | Status: DC | PRN
  Administered 2024-03-17: 4 via RESPIRATORY_TRACT

## 2024-03-17 MED ORDER — AMIODARONE LOAD VIA INFUSION
150.0000 mg | Freq: Once | INTRAVENOUS | Status: AC
  Administered 2024-03-17: 150 mg via INTRAVENOUS

## 2024-03-17 MED ORDER — BISACODYL 10 MG RE SUPP
10.0000 mg | Freq: Every day | RECTAL | Status: DC
  Administered 2024-03-21: 10 mg via RECTAL
  Filled 2024-03-17: qty 1

## 2024-03-17 MED ORDER — CALCIUM CHLORIDE 10 % IV SOLN
2.0000 g | Freq: Once | INTRAVENOUS | Status: AC
  Administered 2024-03-17: 2 g via INTRAVENOUS

## 2024-03-17 MED ORDER — MIDAZOLAM HCL (PF) 10 MG/2ML IJ SOLN
INTRAMUSCULAR | Status: AC
  Filled 2024-03-17: qty 2

## 2024-03-17 MED ORDER — ACETAMINOPHEN 160 MG/5ML PO SOLN
650.0000 mg | Freq: Once | ORAL | Status: AC
  Administered 2024-03-17: 650 mg
  Filled 2024-03-17: qty 20.3

## 2024-03-17 MED ORDER — SODIUM CHLORIDE 0.9% FLUSH
3.0000 mL | INTRAVENOUS | Status: DC | PRN
  Administered 2024-03-21: 3 mL via INTRAVENOUS

## 2024-03-17 MED ORDER — DEXMEDETOMIDINE HCL IN NACL 400 MCG/100ML IV SOLN
0.0000 ug/kg/h | INTRAVENOUS | Status: DC
  Administered 2024-03-18: 0.6 ug/kg/h via INTRAVENOUS
  Administered 2024-03-18: 0.7 ug/kg/h via INTRAVENOUS
  Filled 2024-03-17 (×2): qty 100

## 2024-03-17 MED ORDER — ASPIRIN 81 MG PO CHEW
324.0000 mg | CHEWABLE_TABLET | Freq: Once | ORAL | Status: AC
  Administered 2024-03-17: 324 mg via ORAL
  Filled 2024-03-17: qty 4

## 2024-03-17 MED ORDER — PHENYLEPHRINE HCL (PRESSORS) 10 MG/ML IV SOLN
INTRAVENOUS | Status: DC | PRN
  Administered 2024-03-17: 80 ug via INTRAVENOUS
  Administered 2024-03-17: 40 ug via INTRAVENOUS
  Administered 2024-03-17 (×7): 80 ug via INTRAVENOUS
  Administered 2024-03-17: 160 ug via INTRAVENOUS
  Administered 2024-03-17: 80 ug via INTRAVENOUS

## 2024-03-17 MED ORDER — HEPARIN SODIUM (PORCINE) 1000 UNIT/ML IJ SOLN
INTRAMUSCULAR | Status: AC
  Filled 2024-03-17: qty 10

## 2024-03-17 MED ORDER — SODIUM BICARBONATE 8.4 % IV SOLN
50.0000 meq | Freq: Once | INTRAVENOUS | Status: AC
  Administered 2024-03-17: 50 meq via INTRAVENOUS

## 2024-03-17 MED ORDER — ACETAMINOPHEN 160 MG/5ML PO SOLN
1000.0000 mg | Freq: Four times a day (QID) | ORAL | Status: AC
  Administered 2024-03-18 – 2024-03-22 (×15): 1000 mg
  Filled 2024-03-17 (×15): qty 40.6

## 2024-03-17 MED ORDER — ORAL CARE MOUTH RINSE
15.0000 mL | OROMUCOSAL | Status: DC
  Administered 2024-03-17 – 2024-03-24 (×77): 15 mL via OROMUCOSAL

## 2024-03-17 MED ORDER — MILRINONE LACTATE IN DEXTROSE 20-5 MG/100ML-% IV SOLN
0.1250 ug/kg/min | INTRAVENOUS | Status: DC
  Administered 2024-03-18: 0.125 ug/kg/min via INTRAVENOUS
  Filled 2024-03-17 (×2): qty 100

## 2024-03-17 MED ORDER — AMIODARONE HCL IN DEXTROSE 360-4.14 MG/200ML-% IV SOLN
INTRAVENOUS | Status: AC
  Administered 2024-03-17: 60 mg/h via INTRAVENOUS
  Filled 2024-03-17: qty 200

## 2024-03-17 MED ORDER — SODIUM CHLORIDE 0.9% FLUSH
10.0000 mL | Freq: Two times a day (BID) | INTRAVENOUS | Status: DC
  Administered 2024-03-17: 10 mL
  Administered 2024-03-18: 20 mL
  Administered 2024-03-18 – 2024-03-20 (×5): 10 mL
  Administered 2024-03-21: 40 mL
  Administered 2024-03-21 – 2024-03-22 (×3): 10 mL
  Administered 2024-03-23: 20 mL
  Administered 2024-03-23: 10 mL
  Administered 2024-03-24 (×2): 20 mL
  Administered 2024-03-24 – 2024-03-28 (×14): 10 mL
  Administered 2024-03-29: 40 mL
  Administered 2024-03-29 – 2024-03-30 (×2): 10 mL
  Administered 2024-03-30: 20 mL
  Administered 2024-03-31: 10 mL
  Administered 2024-03-31: 20 mL
  Administered 2024-04-01: 10 mL

## 2024-03-17 MED ORDER — PROTAMINE SULFATE 10 MG/ML IV SOLN
INTRAVENOUS | Status: AC
  Filled 2024-03-17: qty 25

## 2024-03-17 MED ORDER — SODIUM CHLORIDE 0.9 % IV SOLN
250.0000 mL | INTRAVENOUS | Status: AC
  Administered 2024-03-18: 250 mL via INTRAVENOUS

## 2024-03-17 MED ORDER — DEXTROSE 50 % IV SOLN: 0.0000 mL | INTRAVENOUS | Status: DC | PRN

## 2024-03-17 MED ORDER — HEMOSTATIC AGENTS (NO CHARGE) OPTIME
TOPICAL | Status: DC | PRN
Start: 2024-03-17 — End: 2024-03-17
  Administered 2024-03-17 (×4): 1 via TOPICAL

## 2024-03-17 MED ORDER — METOPROLOL TARTRATE 5 MG/5ML IV SOLN
2.5000 mg | INTRAVENOUS | Status: DC | PRN
  Administered 2024-03-21 – 2024-03-22 (×3): 5 mg via INTRAVENOUS
  Filled 2024-03-17 (×3): qty 5

## 2024-03-17 SURGICAL SUPPLY — 107 items
ADAPTER CARDIO PERF ANTE/RETRO (ADAPTER) IMPLANT
ADAPTER MULTI PERFUSION 15 (ADAPTER) ×3 IMPLANT
BAG DECANTER FOR FLEXI CONT (MISCELLANEOUS) ×3 IMPLANT
BLADE CLIPPER SURG (BLADE) ×3 IMPLANT
BLADE STERNUM SYSTEM 6 (BLADE) ×3 IMPLANT
BNDG ELASTIC 4INX 5YD STR LF (GAUZE/BANDAGES/DRESSINGS) IMPLANT
BNDG ELASTIC 6INX 5YD STR LF (GAUZE/BANDAGES/DRESSINGS) ×3 IMPLANT
BNDG GAUZE DERMACEA FLUFF 4 (GAUZE/BANDAGES/DRESSINGS) ×3 IMPLANT
CANISTER SUCTION 3000ML PPV (SUCTIONS) ×3 IMPLANT
CANNULA AORTIC ROOT 9FR (CANNULA) ×3 IMPLANT
CANNULA EZ GLIDE AORTIC 21FR (CANNULA) ×3 IMPLANT
CANNULA GUNDRY RCSP 15FR (MISCELLANEOUS) IMPLANT
CANNULA MC2 2 STG 36/46 CONN (CANNULA) IMPLANT
CANNULA OPTISITE PERFUSION 20F (CANNULA) IMPLANT
CANNULA SUMP PERICARDIAL (CANNULA) IMPLANT
CANNULA VESSEL 3MM BLUNT TIP (CANNULA) ×9 IMPLANT
CATH HEART VENT LEFT (CATHETERS) IMPLANT
CATH ROBINSON RED A/P 18FR (CATHETERS) ×3 IMPLANT
CATH THORACIC 36FR (CATHETERS) ×3 IMPLANT
CATH THORACIC 36FR RT ANG (CATHETERS) ×3 IMPLANT
CAUTERY SURG HI TEMP FINE TIP (MISCELLANEOUS) IMPLANT
CLIP FOGARTY SPRING 6M (CLIP) IMPLANT
CLIP TI MEDIUM 24 (CLIP) IMPLANT
CLIP TI WIDE RED SMALL 24 (CLIP) IMPLANT
CNTNR URN SCR LID CUP LEK RST (MISCELLANEOUS) IMPLANT
CONN ST 1/4X3/8 BEN (MISCELLANEOUS) IMPLANT
CONTAINER PROTECT SURGISLUSH (MISCELLANEOUS) ×6 IMPLANT
DERMABOND ADVANCED .7 DNX12 (GAUZE/BANDAGES/DRESSINGS) IMPLANT
DRAPE SRG 135X102X78XABS (DRAPES) ×3 IMPLANT
DRAPE WARM FLUID 44X44 (DRAPES) ×3 IMPLANT
DRSG COVADERM 4X14 (GAUZE/BANDAGES/DRESSINGS) ×3 IMPLANT
DRSG COVADERM 4X6 (GAUZE/BANDAGES/DRESSINGS) IMPLANT
ELECTRODE REM PT RTRN 9FT ADLT (ELECTROSURGICAL) ×6 IMPLANT
FELT TEFLON 1X6 (MISCELLANEOUS) ×3 IMPLANT
FELT TEFLON 6X6 (MISCELLANEOUS) IMPLANT
FEMORAL VENOUS CANN RAP (CANNULA) IMPLANT
GAUZE SPONGE 4X4 12PLY STRL (GAUZE/BANDAGES/DRESSINGS) ×6 IMPLANT
GLOVE SS BIOGEL STRL SZ 7.5 (GLOVE) ×3 IMPLANT
GOWN STRL REUS W/ TWL LRG LVL3 (GOWN DISPOSABLE) ×12 IMPLANT
GOWN STRL REUS W/ TWL XL LVL3 (GOWN DISPOSABLE) ×6 IMPLANT
GRAFT HEMASHIELD 30X10 (Vascular Products) IMPLANT
HANDLE YANKAUER SUCT OPEN TIP (MISCELLANEOUS) IMPLANT
HEMOSTAT POWDER SURGIFOAM 1G (HEMOSTASIS) ×9 IMPLANT
HEMOSTAT SURGICEL 2X14 (HEMOSTASIS) ×3 IMPLANT
INSERT FOGARTY XLG (MISCELLANEOUS) IMPLANT
KIT BASIN OR (CUSTOM PROCEDURE TRAY) ×3 IMPLANT
KIT CATH SUCT 8FR (CATHETERS) IMPLANT
KIT DILATOR VASC 18G NDL (KITS) IMPLANT
KIT SUCTION CATH 14FR (SUCTIONS) ×6 IMPLANT
KIT TURNOVER KIT B (KITS) ×3 IMPLANT
KIT VASOVIEW HEMOPRO 2 VH 4000 (KITS) ×3 IMPLANT
LINE VENT (MISCELLANEOUS) IMPLANT
LOOP VASCLR MAXI BLUE 18IN ST (MISCELLANEOUS) IMPLANT
LOOPS VASCLR MAXI BLUE 18IN ST (MISCELLANEOUS) ×6 IMPLANT
MARKER GRAFT CORONARY BYPASS (MISCELLANEOUS) ×9 IMPLANT
NDL AORTIC AIR ASPIRATING (NEEDLE) IMPLANT
NEEDLE AORTIC AIR ASPIRATING (NEEDLE) ×3 IMPLANT
NS IRRIG 1000ML POUR BTL (IV SOLUTION) ×15 IMPLANT
PACK E OPEN HEART (SUTURE) ×3 IMPLANT
PACK OPEN HEART (CUSTOM PROCEDURE TRAY) ×3 IMPLANT
PAD ARMBOARD POSITIONER FOAM (MISCELLANEOUS) ×6 IMPLANT
PAD ELECT DEFIB RADIOL ZOLL (MISCELLANEOUS) ×3 IMPLANT
PENCIL BUTTON HOLSTER BLD 10FT (ELECTRODE) ×3 IMPLANT
POSITIONER HEAD DONUT 9IN (MISCELLANEOUS) ×3 IMPLANT
PUNCH AORTIC ROTATE 4.0MM (MISCELLANEOUS) IMPLANT
PUNCH AORTIC ROTATE 4.5MM 8IN (MISCELLANEOUS) IMPLANT
PUNCH AORTIC ROTATE 5MM 8IN (MISCELLANEOUS) IMPLANT
RELOAD STAPLE 45 3.6 BLU REG (STAPLE) IMPLANT
SET MPS 3-ND DEL (MISCELLANEOUS) IMPLANT
SPONGE T-LAP 18X18 ~~LOC~~+RFID (SPONGE) IMPLANT
STAPLER SKIN PROX 35W (STAPLE) IMPLANT
SUPPORT HEART JANKE-BARRON (MISCELLANEOUS) ×3 IMPLANT
SUT BONE WAX W31G (SUTURE) ×3 IMPLANT
SUT ETHIBOND 2 0 SH 36X2 (SUTURE) IMPLANT
SUT ETHIBOND NAB MH 2-0 36IN (SUTURE) IMPLANT
SUT MNCRL AB 4-0 PS2 18 (SUTURE) IMPLANT
SUT PROLENE 2 0 SH DA (SUTURE) IMPLANT
SUT PROLENE 3 0 SH DA (SUTURE) ×3 IMPLANT
SUT PROLENE 4 0 SH DA (SUTURE) IMPLANT
SUT PROLENE 4-0 RB1 .5 CRCL 36 (SUTURE) IMPLANT
SUT PROLENE 5 0 C 1 36 (SUTURE) IMPLANT
SUT PROLENE 6 0 C 1 30 (SUTURE) ×6 IMPLANT
SUT PROLENE 7 0 BV1 MDA (SUTURE) ×3 IMPLANT
SUT PROLENE 8 0 BV175 6 (SUTURE) IMPLANT
SUT SILK 1 MH (SUTURE) IMPLANT
SUT SILK 1 TIES 10X30 (SUTURE) IMPLANT
SUT STEEL 6MS V (SUTURE) ×3 IMPLANT
SUT STEEL STERNAL CCS#1 18IN (SUTURE) IMPLANT
SUT STEEL SZ 6 DBL 3X14 BALL (SUTURE) ×3 IMPLANT
SUT VIC AB 1 CTX36XBRD ANBCTR (SUTURE) ×6 IMPLANT
SUT VIC AB 2-0 CT1 TAPERPNT 27 (SUTURE) IMPLANT
SUT VIC AB 2-0 CTX 27 (SUTURE) IMPLANT
SUT VIC AB 3-0 SH 27X BRD (SUTURE) IMPLANT
SUT VIC AB 3-0 X1 27 (SUTURE) IMPLANT
SYR 10ML KIT SKIN ADHESIVE (MISCELLANEOUS) IMPLANT
SYSTEM SAHARA CHEST DRAIN ATS (WOUND CARE) ×3 IMPLANT
TAPE PAPER 2X10 WHT MICROPORE (GAUZE/BANDAGES/DRESSINGS) IMPLANT
TOWEL GREEN STERILE (TOWEL DISPOSABLE) ×3 IMPLANT
TOWEL GREEN STERILE FF (TOWEL DISPOSABLE) ×3 IMPLANT
TRAY FOLEY SLVR 16FR TEMP STAT (SET/KITS/TRAYS/PACK) ×3 IMPLANT
TUBE CONNECTING 12X1/4 (SUCTIONS) IMPLANT
TUBE CONNECTING 20X1/4 (TUBING) IMPLANT
TUBE SUCT INTRACARD DLP 20F (MISCELLANEOUS) ×3 IMPLANT
TUBE SUCTION CARDIAC 10FR (CANNULA) ×3 IMPLANT
TUBING LAP HI FLOW INSUFFLATIO (TUBING) ×3 IMPLANT
UNDERPAD 30X36 HEAVY ABSORB (UNDERPADS AND DIAPERS) ×3 IMPLANT
WATER STERILE IRR 1000ML POUR (IV SOLUTION) ×6 IMPLANT

## 2024-03-17 NOTE — Plan of Care (Signed)
  Problem: Education: Goal: Understanding of CV disease, CV risk reduction, and recovery process will improve Outcome: Progressing Goal: Individualized Educational Video(s) Outcome: Progressing   Problem: Activity: Goal: Ability to return to baseline activity level will improve Outcome: Progressing   Problem: Cardiovascular: Goal: Ability to achieve and maintain adequate cardiovascular perfusion will improve Outcome: Progressing Goal: Vascular access site(s) Level 0-1 will be maintained Outcome: Progressing   Problem: Clinical Measurements: Goal: Ability to maintain clinical measurements within normal limits will improve Outcome: Progressing Goal: Will remain free from infection Outcome: Progressing Goal: Diagnostic test results will improve Outcome: Progressing Goal: Respiratory complications will improve Outcome: Progressing Goal: Cardiovascular complication will be avoided Outcome: Progressing

## 2024-03-17 NOTE — Transfer of Care (Signed)
 Immediate Anesthesia Transfer of Care Note  Patient: Alan [name]  Procedure(s) Performed: CORONARY ARTERY BYPASS GRAFTING TIMES THREE , USING LEFT INTERNAL MAMMARY ARTERY AND ENDOSCOPIC HARVESTED RIGHT SAPHENOUS VEIN (Chest) ECHOCARDIOGRAM, TRANSESOPHAGEAL, INTRAOPERATIVE REPAIR, AORTIC DISSECTION, ASCENDING USING 30 MM HEMASHIELD PLATINUM VASCULAR GRAFT RESUSPENSION OF AORTIC VALVE  Patient Location: SICU  Anesthesia Type:General  Level of Consciousness: Patient remains intubated per anesthesia plan  Airway & Oxygen Therapy: Patient remains intubated per anesthesia plan and Patient placed on Ventilator (see vital sign flow sheet for setting)  Post-op Assessment: Report given and vital signs reviewed.  Surgeon at bedside.  Post vital signs: Reviewed and stable  Last Vitals:  Vitals Value Taken Time  BP 101/46   Temp 34.6 C 03/17/24 16:11  Pulse 88 03/17/24 16:11  Resp 16 03/17/24 16:11  SpO2 94 % 03/17/24 16:11  Vitals shown include unfiled device data.  Last Pain:  Vitals:   03/17/24 0656  TempSrc: Oral  PainSc: 0-No pain      Patients Stated Pain Goal: 0 (03/17/24 0048)  Complications: No notable events documented.

## 2024-03-17 NOTE — Brief Op Note (Addendum)
 03/17/2024  3:49 PM  PATIENT:  Alan [name]  68 y.o. male  PRE-OPERATIVE DIAGNOSIS:  CORONARY ARTERY DISEASE  POST-OPERATIVE DIAGNOSIS:  CORONARY ARTERY DISEASE, AORTIC DISSECTION  PROCEDURE:   CORONARY ARTERY BYPASS GRAFTING TIMES THREE , USING LEFT INTERNAL MAMMARY ARTERY AND ENDOSCOPIC HARVESTED RIGHT SAPHENOUS VEIN (N/A) ECHOCARDIOGRAM, TRANSESOPHAGEAL, INTRAOPERATIVE  HEMIARCH REPAIR, AORTIC DISSECTION, ASCENDING USING 30 MM HEMASHIELD PLATINUM VASCULAR GRAFT WITH RESUSPENSION OF AORTIC VALVE UNDER MODERATE HYPOTHERMIC CIRCULATORY ARREST  RESUSPENSION OF AORTIC VALVE  Vein harvest time: Vein prep time:  SURGEON:     * Kerrin Elspeth BROCKS, MD - Primary    * Lightfoot, Linnie KIDD, MD - Assisting  PHYSICIAN ASSISTANT: MICAEL Cera, PA-C  ASSISTANTS: Pesare, Piyanuch, RN, Scrub Person         Lawernce Mariel RAMAN, RN, RN First Assistant  Price, Evalene BIRCH, RN, Circulator Assistant   ANESTHESIA:   general  EBL:  1175 mL   BLOOD ADMINISTERED: Plts:  Cryo:  94ml FFP:  394ml PRBCs:  DRAINS: Left pleural and mediastinal drains   LOCAL MEDICATIONS USED:  NONE  SPECIMEN: Dissected ascending aorta  DISPOSITION OF SPECIMEN:  PATHOLOGY  COUNTS:  Correct  DICTATION: .Dragon Dictation  PLAN OF CARE: Admit to inpatient   PATIENT DISPOSITION:  ICU - intubated and hemodynamically stable.   Delay start of Pharmacological VTE agent (>24hrs) due to surgical blood loss or risk of bleeding: yes

## 2024-03-17 NOTE — Anesthesia Procedure Notes (Signed)
 Arterial Line Insertion Performed by: Lamar Lucie DASEN, CRNA, CRNA  Patient location: Pre-op. Preanesthetic checklist: patient identified, IV checked, site marked, risks and benefits discussed, surgical consent, monitors and equipment checked, pre-op evaluation, timeout performed and anesthesia consent Lidocaine  1% used for infiltration Right, radial was placed Catheter size: 20 G Hand hygiene performed  and maximum sterile barriers used   Attempts: 1 Procedure performed without using ultrasound guided technique. Following insertion, dressing applied and Biopatch. Post procedure assessment: normal and unchanged

## 2024-03-17 NOTE — Consult Note (Addendum)
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  Admitted 7/28 w/ chest pain. Initially started 7/26 went to ER and CEs and EKG neg so went home but CP persisted so returned.  In ER on 28th + trop I, new T wave inversion in anterior lateral leads.  Treated w/ supplemental oxygen, morphine , started on heparin  gtt.  Went to cath lab  Findings:  99% -80% in-stent restenosis of LAD balloon angioplasty completed w/ partial restoration of flow (this re-stenosed stent was felt culprit lesion). RCA 45 to 70% stenosed, cx was 40%. Had nml LVEDP, no AS, referred to CVTS for CABG given 3V disease. and placed on plavix . EF from ECHO 30-35% w/ left regional wall motion abnormality.   Went to OR 8/4 for planned CABG x 3, as well as repair of ascending aortic dissection w/ vascular graft and repair of aortic valve  Time on bypass: ~ 4h31min EBL 1390 Cell saver 910 Received: FFP X2, PLTs 1 unit PRBC and cryo for on going surgical oozing Arrived in ICU w 480 ml bloody outpt from Chest tube Surgical team at bedside on arrival  Shortly after arrival to ICU had runs of VT and AF w/ RVR. Amiodarone  bolus and gtt initiated.  Got 2 gms calcium  another 2 units PLTs. TXA  Pertinent  Medical History  Prior CAD w/ DES X 2 and prior minimally invasive MVR 2015, NSCLC q/ mets to bone (2023) s/p chemo and XRT w/ most recent scans showing stable non-active disease  NSTEMI, HLD Significant Hospital Events: Including procedures, antibiotic start and stop dates in addition to other pertinent events   7/28 admitted w/ CP and acute inf/lat MI, cardiac cath svr 3V disease w/ re-stenosed LAD stent and  incomplete restoration of coronary flow. EF 30-35% referred for CABG 8/4 to OR. CABG x 3 v but as completing CABG noted blood loss and intra-op ECHO showed dissection of Aorta. So underwent AV graft placement and AVR. Large EBL in OR received several blood products. Immediate post op w/ large vol CT output, runs of VT and SVT. Got 2gms calcium , amiodarone  and placed on overdrive pacing via pacer critical care at bedside assisting w/ support   Interim History / Subjective:  Sedated   Objective    Blood pressure 101/73, pulse 73, temperature 97.9 F (36.6 C), temperature source Oral, resp. rate 10, height 6' 4 (1.93 m), weight 79 kg, SpO2 96%. PAP: (23-24)/(8-9) 23/8      Intake/Output Summary (Last 24 hours) at 03/17/2024 1612 Last data filed at 03/17/2024 1558 Gross per 24 hour  Intake 5277.77 ml  Output 2565 ml  Net 2712.77 ml   Filed Weights   03/10/24 1212 03/11/24 0141  Weight: 79 kg 79 kg    Examination: General: sedated 68 year old male currently on full vent support HENT: NCAT orally intubated. MMM Lungs: decreased on right. Some scattered rhonchi. Intermittent airleak from left CT. Currently at 700 cc output bloody Cardiovascular: paced no ventricularly at 80 bpp, sternal dressing CD&I Abdomen: soft hypoactive  Extremities: warm pulses stron Neuro: sedated  GU: good UOP   Resolved problem list   Assessment  and Plan  Ischemic CM w/ 3V CAD and restenosis of DES now s/p CABG x 3 V w/ intraoperative finding of ascending aorta dissection requiring aortic graft and AVR H/o HLD, HLD and CAD w/ severe mitral valve disease. prior stents and Mini mitral valve repair (2015) Plan Rate control currently w/ amiodarone  and ventricular pacing Holding off on AC given acute blood loss Close obs of Chest tube output Correct coagulopathy  Eventually resume post-op statin and asa  Tight Glycemic control  Treat fevers Multi-modal pain rx  Holding BB given shock state Complete  prophylactic antibiotics  Undifferentiated shock. Think this is mixed picture of hemorrhagic shock, post-operative vasoplegia superimposed on his HF w/ EF 30-35% Plan Keep euvolemic Titrate norepi for MAP > 65; once norepi <5 mcg/kg/min can start trying to wean epi  Cont current milrinone  (low dose) may still require epi for additional inotropic support.  Goal CI > 2 Correct coagulopathy (getting PLTs, already got Calcium , CT outputs slowing down) Cont tele Hold BB given shock state Close obs of I&O and renal indices   Post operative blood loss w/ coagulopathy following prolonged CPB time and hypocalcemia (likely 2/2 mult blood products w/ citrate)  Plan Cont trend CBC goal hgb < 8 or if active bleeding Transfusing PLTs will follow cbc after Completing Txa  Cont to trend coags, cbc Holding AC for now  Post-op VT w/ high risk for AF  Plan Amiodarone  gtt  Ensure K > 4. Mg > 2 Currently paced at 80 bpm  Post-operative vent management  Abg reviewed.  Plan Cont full vent support  Active re-warming.  Once re-warmed, if remains hemodynamically stable and chest tube output <100 over 2 hrs can start weaning  Small left pneumothorax and residual left hemothorax Plan Cont monitor CT output.  F/u CXR Hoping anterior CTs will drain the left pleural space but may require additional CT   H/o nsclc w/ bone mets. Last PET w/ no active disease Plan  F/u hem-onc  Best Practice (right click and Reselect all SmartList Selections daily)   Diet/type: NPO DVT prophylaxis SCD Pressure ulcer(s): N/A GI prophylaxis: PPI Lines: Central line Foley:  Yes, and it is still needed Code Status:  full code Last date of multidisciplinary goals of care discussion [per primary ]  Labs   CBC: Recent Labs  Lab 03/13/24 0430 03/14/24 0456 03/15/24 0438 03/16/24 0410 03/17/24 0503 03/17/24 0751 03/17/24 1049 03/17/24 1051 03/17/24 1328 03/17/24 1331 03/17/24 1435 03/17/24 1438  03/17/24 1501 03/17/24 1549 03/17/24 1552  WBC 4.5 4.2 4.8 4.7 5.1  --   --   --   --   --   --   --   --   --   --   HGB 11.7* 11.5* 12.4* 12.3* 12.9*   < > 7.9*   < > 8.3*   < > 8.2* 8.8* 7.8* 8.8* 8.8*  HCT 34.8* 33.9* 37.0* 36.3* 39.4   < > 23.8*   < > 24.5*   < > 24.0* 26.0* 23.0* 26.0* 26.0*  MCV 94.3 93.4 94.4 94.5 95.6  --   --   --   --   --   --   --   --   --   --   PLT 138* 144* 156 159 187  --  116*  --  77*  --   --   --   --   --   --    < > = values in this interval  not displayed.    Basic Metabolic Panel: Recent Labs  Lab 03/11/24 0422 03/12/24 0444 03/14/24 0456 03/17/24 0503 03/17/24 0751 03/17/24 1208 03/17/24 1310 03/17/24 1313 03/17/24 1400 03/17/24 1435 03/17/24 1438 03/17/24 1501 03/17/24 1549 03/17/24 1552  NA 141 141 142 139   < > 136 141   < > 140 142 143 145 143 145  K 3.3* 3.7 3.4* 3.7   < > 6.8* 5.3*   < > 5.9* 4.9 4.9 4.0 3.9 4.0  CL 107 111 111 109   < > 107 102  --  106 107  --   --  106  --   CO2 23 22 23 22   --   --   --   --   --   --   --   --   --   --   GLUCOSE 149* 102* 95 94   < > 216* 224*  --  179* 157*  --   --  165*  --   BUN 12 11 9 14    < > 11 11  --  11 11  --   --  9  --   CREATININE 0.75 0.71 0.78 0.71   < > 0.60* 0.60*  --  0.50* 0.50*  --   --  0.50*  --   CALCIUM  9.2 9.2 9.1 9.4  --   --   --   --   --   --   --   --   --   --    < > = values in this interval not displayed.   GFR: Estimated Creatinine Clearance: 100.1 mL/min (A) (by C-G formula based on SCr of 0.5 mg/dL (L)). Recent Labs  Lab 03/14/24 0456 03/15/24 0438 03/16/24 0410 03/17/24 0503  WBC 4.2 4.8 4.7 5.1    Liver Function Tests: No results for input(s): AST, ALT, ALKPHOS, BILITOT, PROT, ALBUMIN  in the last 168 hours. No results for input(s): LIPASE, AMYLASE in the last 168 hours. No results for input(s): AMMONIA in the last 168 hours.  ABG    Component Value Date/Time   PHART 7.331 (L) 03/17/2024 1552   PCO2ART 40.0  03/17/2024 1552   PO2ART 161 (H) 03/17/2024 1552   HCO3 21.1 03/17/2024 1552   TCO2 22 03/17/2024 1552   ACIDBASEDEF 4.0 (H) 03/17/2024 1552   O2SAT 99 03/17/2024 1552     Coagulation Profile: Recent Labs  Lab 03/16/24 0410 03/17/24 1446  INR 1.0 2.3*    Cardiac Enzymes: No results for input(s): CKTOTAL, CKMB, CKMBINDEX, TROPONINI in the last 168 hours.  HbA1C: Hgb A1c MFr Bld  Date/Time Value Ref Range Status  03/12/2024 04:44 AM 5.0 4.8 - 5.6 % Final    Comment:    (NOTE) Diagnosis of Diabetes The following HbA1c ranges recommended by the American Diabetes Association (ADA) may be used as an aid in the diagnosis of diabetes mellitus.  Hemoglobin             Suggested A1C NGSP%              Diagnosis  <5.7                   Non Diabetic  5.7-6.4                Pre-Diabetic  >6.4                   Diabetic  <7.0  Glycemic control for                       adults with diabetes.    04/06/2014 01:19 PM 5.4 <5.7 % Final    Comment:    (NOTE)                                                                       According to the ADA Clinical Practice Recommendations for 2011, when HbA1c is used as a screening test:  >=6.5%   Diagnostic of Diabetes Mellitus           (if abnormal result is confirmed) 5.7-6.4%   Increased risk of developing Diabetes Mellitus References:Diagnosis and Classification of Diabetes Mellitus,Diabetes Care,2011,34(Suppl 1):S62-S69 and Standards of Medical Care in         Diabetes - 2011,Diabetes Care,2011,34 (Suppl 1):S11-S61.    CBG: No results for input(s): GLUCAP in the last 168 hours.  Review of Systems:   Not able   Past Medical History:  He,  has a past medical history of Adenocarcinoma of right lung, stage 3 (HCC) (dx'd 03/2019), CAD S/P percutaneous coronary angioplasty (2004), Dyslipidemia, goal LDL below 70, Essential hypertension, GERD (gastroesophageal reflux disease), History of Mitral valve  prolapse, History of radiation therapy, History of Severe mitral regurgitation by prior echocardiogram (02/06/2014), Incidental lung nodule, greater than or equal to 8mm (03/16/2014), PONV (postoperative nausea and vomiting), S/P Minimally Invasive MVR (mitral valve repair) (04/08/2014), and ST elevation myocardial infarction (STEMI) of anterior wall, subsequent episode of care [facility]) (2004).   Surgical History:   Past Surgical History:  Procedure Laterality Date   ANTERIOR CRUCIATE LIGAMENT REPAIR Left 1993   COLONOSCOPY     CORONARY BALLOON ANGIOPLASTY N/A 03/10/2024   Procedure: CORONARY BALLOON ANGIOPLASTY;  Surgeon: Anner Alm ORN, MD;  Location: [facility] INVASIVE CV LAB;  Service: Cardiovascular;  Laterality: N/A;   INTRAOPERATIVE TRANSESOPHAGEAL ECHOCARDIOGRAM N/A 04/08/2014   Procedure: INTRAOPERATIVE TRANSESOPHAGEAL ECHOCARDIOGRAM;  Surgeon: Sudie VEAR Laine, MD;  Location: [facility] OR;  Service: Open Heart Surgery;  Laterality: N/A;   IR RADIOLOGIST EVAL & MGMT  10/04/2022   IR RADIOLOGIST EVAL & MGMT  11/01/2022   IR RADIOLOGIST EVAL & MGMT  12/06/2022   IR RADIOLOGIST EVAL & MGMT  01/23/2023   LEFT AND RIGHT HEART CATHETERIZATION WITH CORONARY ANGIOGRAM N/A 03/05/2014   Procedure: LEFT AND RIGHT HEART CATHETERIZATION WITH CORONARY ANGIOGRAM;  Surgeon: Alm ORN Anner, MD;  Location: [facility] CATH LAB;  Service: Cardiovascular; (pre-op) Widely patent LAD stents, 40% distal LAD, 30% Cx, ~50% PL-PDA bifurcation lesion    LEFT HEART CATH AND CORONARY ANGIOGRAPHY  2004   In setting of anterior STEMI, found to have 100% % proximal LAD (2 DES Cypher stents)   LEFT HEART CATH AND CORONARY ANGIOGRAPHY  2007   Widely patent LAD stents, 40% distal LAD, 30% Cx, ~50% PL-PDA bifurcation lesion    LEFT HEART CATH AND CORONARY ANGIOGRAPHY N/A 03/10/2024   Procedure: LEFT HEART CATH AND CORONARY ANGIOGRAPHY;  Surgeon: Anner Alm ORN, MD;  Location: [facility] INVASIVE CV LAB;  Service: Cardiovascular;  Laterality: N/A;   LEFT  HEART CATHETERIZATION WITH CORONARY ANGIOGRAM N/A 09/15/2011   Procedure: LEFT HEART CATHETERIZATION WITH CORONARY ANGIOGRAM;  Surgeon: Dorn JINNY Lesches, MD;  Location: [facility] CATH LAB;  Service: Cardiovascular;  Laterality: N/A;   MITRAL VALVE REPAIR Right 04/08/2014   Procedure: MINIMALLY INVASIVE MITRAL VALVE REPAIR (MVR);  Surgeon: Sudie VEAR Laine, MD;  Location: [facility] OR;  Service: Open Heart Surgery;  Laterality: Right;   NM MYOVIEW  LTD  12/2009   Walk 9 minutes, and 10 METs, diaphragmatic attenuation but no ischemia or infarction.   SVT ABLATION N/A 10/17/2019   Procedure: SVT ABLATION;  Surgeon: Inocencio Soyla Lunger, MD;  Location: [facility] INVASIVE CV LAB;  Service: Cardiovascular;  Laterality: N/A;   TEE WITHOUT CARDIOVERSION N/A 02/06/2014   Procedure: TRANSESOPHAGEAL ECHOCARDIOGRAM (TEE);  Surgeon: Vinie KYM Maxcy, MD;  Normal LV Size U& function - EF 55-60%, no regional WMA.  MV P2 Leaflet is flail with ruptured chord with severe prolapse, anterior leaflet intact.  Severe, eccentric anterior directed MR with dilated LA.   TRANSTHORACIC ECHOCARDIOGRAM  07/18/2018   Normal LV size and function.  EF 50-60 %.  Unable to assess diastolic function.  Mitral valve sewing ring in place.  Mild stenosis with a gradient of 6 mmHg noted. -   TRANSTHORACIC ECHOCARDIOGRAM  09/30/2021   Stable findings.  EF 50 to 55%.  No RWMA.  Mitral valve stable.  No significant MR.  Normal aortic valve.  Mild aortic dilation, but probably normal for age.   VIDEO BRONCHOSCOPY WITH ENDOBRONCHIAL ULTRASOUND N/A 05/16/2019   Procedure: VIDEO BRONCHOSCOPY WITH ENDOBRONCHIAL ULTRASOUND;  Surgeon: Mannam, Praveen, MD;  Location: [facility] OR;  Service: Pulmonary;  Laterality: N/A;     Social History:   reports that he has never smoked. He has never used smokeless tobacco. He reports current alcohol use of about 10.0 standard drinks of alcohol per week. He reports that he does not use drugs.   Family History:  His family history  includes Cancer in his paternal grandmother; Heart Problems in his father; Hypertension in his mother.   Allergies Allergies  Allergen Reactions   A-Cillin [Ampicillin] Shortness Of Breath, Swelling and Rash    Did it involve swelling of the face/tongue/throat, SOB, or low BP? Yes Did it involve sudden or severe rash/hives, skin peeling, or any reaction on the inside of your mouth or nose? Yes Did you need to seek medical attention at a hospital or doctor's office? Yes When did it last happen? ~20 years ago If all above answers are NO, may proceed with cephalosporin use.     Amoxicillin Hives, Shortness Of Breath and Swelling    Did it involve swelling of the face/tongue/throat, SOB, or low BP? Yes Did it involve sudden or severe rash/hives, skin peeling, or any reaction on the inside of your mouth or nose? Yes Did you need to seek medical attention at a hospital or doctor's office? Yes When did it last happen? ~20 years ago If all above answers are NO, may proceed with cephalosporin use.    Other Hives, Shortness Of Breath and Swelling    ALL CILLINS   Penicillins Hives, Shortness Of Breath and Swelling    Did it involve swelling of the face/tongue/throat, SOB, or low BP? Yes Did it involve sudden or severe rash/hives, skin peeling, or any reaction on the inside of your mouth or nose? Yes Did you need to seek medical attention at a hospital or doctor's office? Yes When did it last happen? ~20 years ago If all above answers are NO, may proceed with cephalosporin use.     Sulfa Antibiotics Nausea  Only   Crestor  [Rosuvastatin ] Other (See Comments)    G I upset     Home Medications  Prior to Admission medications   Medication Sig Start Date End Date Taking? Authorizing Provider  Ascorbic Acid  (VITAMIN C ) 1000 MG tablet Take 2,000 mg by mouth daily.    Yes [provider]  atorvastatin  (LIPITOR) 10 MG tablet Take 1 tablet (10 mg total) by mouth daily. 05/09/23  Yes  Anner Alm ORN, MD  Coenzyme Q10 (CO Q 10) 100 MG CAPS Take 100 mg by mouth daily.    Yes [provider]  diazepam  (VALIUM ) 2 MG tablet Take 1 tablet (2 mg total) by mouth every 12 (twelve) hours as needed for up to 12 doses for anxiety. 03/08/24  Yes Trifan, Donnice PARAS, MD  famotidine  (PEPCID ) 20 MG tablet Take 1 tablet (20 mg total) by mouth 2 (two) times daily. 03/08/24 03/21/2024 Yes Trifan, Donnice PARAS, MD  latanoprost  (XALATAN ) 0.005 % ophthalmic solution Place 1 drop into both eyes every morning. 04/26/23  Yes [provider]  magnesium  30 MG tablet Take 1 tablet by mouth at bedtime.   Yes [provider]  Melatonin 10 MG TABS Take 10 mg by mouth at bedtime.   Yes [provider]  NALTREXONE HCL, PAIN, PO Take 5 mg by mouth daily as needed (for pain).   Yes [provider]  OVER THE COUNTER MEDICATION Take 4 tablets by mouth in the morning, at noon, and at bedtime.  Relief Factor  from online.   Yes [provider]  Specialty Vitamins Products (PROSTATE PO) Take 1 capsule by mouth daily. PROSTATE FORMULA   Yes [provider]  sucralfate  (CARAFATE ) 1 g tablet Take 1 tablet (1 g total) by mouth 3 (three) times daily before meals. 03/08/24 04/02/2024 Yes Trifan, Donnice PARAS, MD  traMADol  (ULTRAM ) 50 MG tablet Take 50 mg by mouth 3 (three) times daily as needed for moderate pain (pain score 4-6). 10/03/22  Yes [provider]  vitamin B-12 (CYANOCOBALAMIN) 1000 MCG tablet Take 1,000 mcg by mouth daily.   Yes [provider]  Vitamin D-Vitamin K (D3 + K2 DOTS PO) Take 1 tablet by mouth daily.   Yes [provider]     Critical care time: 35 minutes

## 2024-03-17 NOTE — Hospital Course (Addendum)
 Referring: No ref. provider found Primary Care: Leonel Cole, MD Primary Cardiologist:David Anner, MD   History of Present Illness: Alan [name] is a 68 year old male with a past medical history of CAD (s/p STEMI with PCI and DES to LAD 2004) followed by Dr. Anner, HTN, HLD, mitral valve prolapse (s/p mini mitral valve repair utilizing a 34mm Sorin Memo 3D [place]ord ring annuloplasty by Dr. Dusty 2015), SVT (s/p ablation 2021), and metastatic lung cancer (s/p palliative radiotherapy, now stable). The patient presented to the ED on 07/26 due to intermittent chest pain over the last week, he reports it started last Monday but he thought it was muscle soreness due to him starting to lift weights. The night of 07/25 he had chest pain that was evaluated by EMS but eventually resolved so he was not transported to the ED. On 07/26 he noted centralized sharp substernal chest pain will dull radiation to the rest of his body and numbness of bilateral arms to his elbows. He denies nausea and vomiting, shortness of breath, diaphoresis, and LOC. Nitroglycerin  provided some relief. He presented to the [facility] ED on 07/26 but was discharged home since troponin levels were flat and he had atypical chest discomfort. The chest pain again worsened on 07/28 so he presented to urgent care and was directed to [facility] ED due to EKG with new T wave inversions. Troponin I (high sensitivity) peaked at 1469, he was ruled in for NSTEMI. Cardiac catheterization 03/10/24 showed 99% LAD in stent restenosis with 60% residual stenosis after balloon angioplasty, mid LAD 80% stenosis with residual 50% stenosis after balloon angioplasty, questionable ostial left circumflex 60-70% stenosis, RCA with ulcerated plaque and focal concentric heavily calcified 70% stenosis and the remainder of the RCA with mild diffuse disease. He was started on Cangrelor  and heparin  post PCI. The patient was given Effient  on 07/28 for cardiac  catheterization and later started on Plavix  but it does not look like he was given a dose of Plavix , this has been discontinued. Echocardiogram on 07/28 showed LVEF 30-35%, left ventricle with moderately decreased function, mildly reduced right ventricular systolic function, mitral valve repair with trivial mitral valve regurgitation, and trivial aortic valve regurgitation no other valvular abnormalities noted.    The patient lives with his wife in a 2 story home but the primary bedroom is on the first floor. He is retired but previously worked in [employer] traffic [employer]. His last workout was 1 week ago and he overall remains active. He reports his cancer has remained stable for the last few years and he continues to follow up with oncology with CT scans. He does report he has developed some depression since the cancer diagnosis and treatment and was started on a medication recently but developed the chest pain soon after starting the medication so he stopped it and does not remember the name. The patient reports he is worried about going back on a heart lung machine.   Following full review of the patient and all relevant studies Dr. Kerrin recommended proceeding with CABG.  He continued to be medically stabilized.  Hospital course:  Following full diagnostic evaluation and medical stabilization he was felt to b stable to proceed with surgery and on 03/17/2024 he was taken the operating room at which time he underwent a CABG x 3.  LIMA-LAD, SVG-posterior descending and SVG-obtuse marginal grafts were placed.  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  He required replacement of Ascending Aortic  Hemiarch with resuspension of the Aortic Valve under hypothermic circulatory arrest.  He tolerated the procedure and was taken to the SICU in critical condition.  Critical care consult was obtained to help with patient management/ventilatory wean. The patient had runs of VT and Atrial  Fibrillation with RVR post operatively.  He was treated with IV Amiodarone .  The patient exhibited seizure like activity. Head CT was obtained and showed no acute abnormality, mild to moderate periventricular deep cerebral volume loss, moderate generalized cerebral volume loss.  EEG was obtained and showed LPDs.  Neurology consult was obtained and recommended MRI once sternal wires were removed and he was started on Keppra . Myoclonic jerking continued upon reduction of sedation. MRI brain done 03/20/2024 showed multiple small infarcts involving different vascular territories and multiple small foci of chronic microhemorrhage (more numerous than in 2024). He was weaned off Milrinone , Levophed , Epinephrine  as hemodynamics allowed.  On post op day 3 he was moving all 4 extremities and following commands, although he was profoundly weak. He was started on tube feedings and on Protonix  bid. He had thrombocytopenia post op. Platelets went as low as 41,000. He had no signs of active bleeding and was no on Lovenox . Amiodarone  was stopped on 08/07. Milrinone  was weaned as hemodynamics tolerated. He developed pneumonia and was started on Levofloxacin  and Vancomycin . Vent was weaned to extubation on 08/10. He had an air leak so chest tube was left in place. He developed a small subpulmonic right effusion and an additional chest tube was placed.  He subsequently has required reintubation.  He developed sepsis with fevers felt most likely a pulmonary source.  On 03/27/2024 he underwent percutaneous tracheostomy.  There is some question as to whether he could possibly have a T-E fistula.  The tracheostomy tube was placed past the abnormal finding.  The patient continued weaning efforts with trach collar trials.

## 2024-03-17 NOTE — Progress Notes (Signed)
 EVENING ROUNDS NOTE :     [address]       [place] [address]             [phone]                 * Day of Surgery * Procedure(s) (LRB): CORONARY ARTERY BYPASS GRAFTING TIMES THREE , USING LEFT INTERNAL MAMMARY ARTERY AND ENDOSCOPIC HARVESTED RIGHT SAPHENOUS VEIN (N/A) ECHOCARDIOGRAM, TRANSESOPHAGEAL, INTRAOPERATIVE (N/A) REPAIR, AORTIC DISSECTION, ASCENDING USING 30 MM HEMASHIELD PLATINUM VASCULAR GRAFT RESUSPENSION OF AORTIC VALVE   Total Length of Stay:  LOS: 7 days  Events:   Labs and HD stable Stable CT output Slow to wake up, understandable given circ arrest     BP (!) 113/46   Pulse 80   Temp 98.1 F (36.7 C)   Resp 16   Ht 6' 4 (1.93 m)   Wt 79 kg   SpO2 97%   BMI 21.20 kg/m   PAP: (23-50)/(8-26) 37/17 CVP:  [5 mmHg-43 mmHg] 5 mmHg PCWP:  [20 mmHg] 20 mmHg CO:  [5.4 L/min-6.4 L/min] 5.5 L/min CI:  [2.6 L/min/m2-3.1 L/min/m2] 2.65 L/min/m2  Vent Mode: SIMV;PSV;PRVC FiO2 (%):  [50 %-60 %] 50 % Set Rate:  [16 bmp] 16 bmp Vt Set:  [620 mL] 620 mL PEEP:  [5 cmH20] 5 cmH20 Pressure Support:  [10 cmH20] 10 cmH20 Plateau Pressure:  [18 cmH20] 18 cmH20   sodium chloride  Stopped (03/17/24 2008)   [START ON 03/18/2024] sodium chloride      sodium chloride  10 mL/hr at 03/17/24 2030   albumin  human 75 mL/hr at 03/17/24 2030   amiodarone  60 mg/hr (03/17/24 2030)   Followed by   amiodarone      dexmedetomidine  (PRECEDEX ) IV infusion 0.2 mcg/kg/hr (03/17/24 2030)   epinephrine  3 mcg/min (03/17/24 2030)   insulin  6 Units/hr (03/17/24 2030)   lactated ringers      lactated ringers  20 mL/hr at 03/17/24 2030   [START ON 03/18/2024] levofloxacin  (LEVAQUIN ) IV     magnesium  sulfate 20 mL/hr at 03/17/24 2030   milrinone  0.125 mcg/kg/min (03/17/24 2030)   norepinephrine  (LEVOPHED ) Adult infusion 8 mcg/min (03/17/24 2030)   phenylephrine  (NEO-SYNEPHRINE) Adult infusion      I/O last 3 completed shifts: In: 7473 [P.O.:760; I.V.:3152.1; Blood:2877;  IV Piggyback:683.9] Out: 3640 [Urine:1750; Blood:1175; Chest Tube:715]      Latest Ref Rng & Units 03/17/2024    6:55 PM 03/17/2024    6:38 PM 03/17/2024    5:40 PM  CBC  WBC 4.0 - 10.5 K/uL 12.2     Hemoglobin 13.0 - 17.0 g/dL 8.9  7.1  7.8   Hematocrit 39.0 - 52.0 % 25.8  21.0  23.0   Platelets 150 - 400 K/uL 126          Latest Ref Rng & Units 03/17/2024    6:38 PM 03/17/2024    5:40 PM 03/17/2024    4:18 PM  BMP  Sodium 135 - 145 mmol/L 146  146  144   Potassium 3.5 - 5.1 mmol/L 3.6  3.7  3.5     ABG    Component Value Date/Time   PHART 7.418 03/17/2024 1838   PCO2ART 32.9 03/17/2024 1838   PO2ART 94 03/17/2024 1838   HCO3 21.6 03/17/2024 1838   TCO2 23 03/17/2024 1838   ACIDBASEDEF 3.0 (H) 03/17/2024 1838   O2SAT 98 03/17/2024 1838       Linnie Rayas, MD 03/17/2024 8:50 PM

## 2024-03-17 NOTE — Anesthesia Procedure Notes (Signed)
 Central Venous Catheter Insertion Performed by: Lucious Debby BRAVO, MD, anesthesiologist Start/End8/11/2023 7:09 AM, 03/17/2024 7:22 AM Patient location: Pre-op. Preanesthetic checklist: patient identified, IV checked, risks and benefits discussed, surgical consent, monitors and equipment checked, pre-op evaluation, timeout performed and anesthesia consent Position: Trendelenburg Lidocaine  1% used for infiltration and patient sedated Hand hygiene performed , maximum sterile barriers used  and Seldinger technique used Catheter size: 8.5 Fr Central line was placed.MAC introducer Procedure performed using ultrasound guided technique. Ultrasound Notes:anatomy identified, needle tip was noted to be adjacent to the nerve/plexus identified, no ultrasound evidence of intravascular and/or intraneural injection and image(s) printed for medical record Attempts: 1 Following insertion, line sutured, dressing applied and Biopatch. Post procedure assessment: blood return through all ports, no air and free fluid flow  Patient tolerated the procedure well with no immediate complications.

## 2024-03-17 NOTE — Progress Notes (Signed)
 Upon first assessment, jerking motions observed in abdomen and in all extremities. Patient with non-purposeful movement so sedation was restarted and maintained throughout the night for relief.   After doing big turns for a bath around 2200, patient dumped of dark red blood from chest tubes (previous output was 73mL/hr).  CBC checked pre and post bath turn with no significant changes. Hemodynamics remained the same after CT dump.

## 2024-03-17 NOTE — Consult Note (Addendum)
Advanced Heart Failure Team Consult Note   Primary Physician: Leonel Cole, MD Cardiologist:  [name], MD  Reason for Consultation: Post cardiotomy shock  HPI:    Alan [name] is seen today for evaluation of post cardiotomy shock at the request of Dr. Kerrin with PCCM. 68 y.o. male with history of CAD s/p anterior STEMI w/ placement of 2 stents to LAD in 2004, severe MR s/p minimally invasive mitral valve repair in 2015, SVT s/p ablation, stage 3 NSCLC with bone mets s/p chemoradiation and radiotherapy (stable disease on most recent scan).   Seen in ED 07/26 with chest heaviness. ECG with no acute changes and HS troponin mildly elevated with flat trend. He was seen by Cardiology and sent home as symptoms were felt to be 2/2 GERD. He presented to ED again on 07/28 with stuttering chest pain. HS troponin elevated to 1469. ECG with lateral T wave changes. He was admitted for management of NSTEMI. Cardiac cath demonstrated severe 3 V CAD with 99% in-stent restenosis LAD s/p PTCA with 50% residual post-intervention. Echo LVEF 30-35%, WMA LAD territory, RV mildly reduced, trivial MR. He underwent CABG X 3 today. Intra-op course c/b ascending aortic dissection repaired with 30 mm Hemashield Platinum graft.  Received 3 u FFP, 2 u plts, 5 u RBCs, 1 cryo, DDAVP , 1 TXA. Given IV amiodarone  and started on gtt after 10 second run VT shortly after arriving to ICU.   Home Medications Prior to Admission medications   Medication Sig Start Date End Date Taking? Authorizing Provider  Ascorbic Acid  (VITAMIN C ) 1000 MG tablet Take 2,000 mg by mouth daily.    Yes [provider]  atorvastatin  (LIPITOR) 10 MG tablet Take 1 tablet (10 mg total) by mouth daily. 05/09/23  Yes Clay Alm ORN, MD  Coenzyme Q10 (CO Q 10) 100 MG CAPS Take 100 mg by mouth daily.    Yes [provider]  diazepam  (VALIUM ) 2 MG tablet Take 1 tablet (2 mg total) by mouth every 12 (twelve) hours as needed for  up to 12 doses for anxiety. 03/08/24  Yes Trifan, Donnice PARAS, MD  famotidine  (PEPCID ) 20 MG tablet Take 1 tablet (20 mg total) by mouth 2 (two) times daily. 03/08/24 03/21/2024 Yes Trifan, Donnice PARAS, MD  latanoprost  (XALATAN ) 0.005 % ophthalmic solution Place 1 drop into both eyes every morning. 04/26/23  Yes [provider]  magnesium  30 MG tablet Take 1 tablet by mouth at bedtime.   Yes [provider]  Melatonin 10 MG TABS Take 10 mg by mouth at bedtime.   Yes [provider]  NALTREXONE HCL, PAIN, PO Take 5 mg by mouth daily as needed (for pain).   Yes [provider]  OVER THE COUNTER MEDICATION Take 4 tablets by mouth in the morning, at noon, and at bedtime.  Relief Factor  from online.   Yes [provider]  Specialty Vitamins Products (PROSTATE PO) Take 1 capsule by mouth daily. PROSTATE FORMULA   Yes [provider]  sucralfate  (CARAFATE ) 1 g tablet Take 1 tablet (1 g total) by mouth 3 (three) times daily before meals. 03/08/24 03/14/2024 Yes Trifan, Donnice PARAS, MD  traMADol  (ULTRAM ) 50 MG tablet Take 50 mg by mouth 3 (three) times daily as needed for moderate pain (pain score 4-6). 10/03/22  Yes [provider]  vitamin B-12 (CYANOCOBALAMIN) 1000 MCG tablet Take 1,000 mcg by mouth daily.   Yes [provider]  Vitamin D-Vitamin K (D3 +  K2 DOTS PO) Take 1 tablet by mouth daily.   Yes [provider]    Past Medical History: Past Medical History:  Diagnosis Date   Adenocarcinoma of right lung, stage 3 (HCC) dx'd 03/2019   CAD S/P percutaneous coronary angioplasty 2004   (Ant STEMI) - Prox LAD 100% => 2 overlapping 3.5 x 1.8 mm Cypher DES stents.;  Patent as of August 2015   Dyslipidemia, goal LDL below 70    Essential hypertension    GERD (gastroesophageal reflux disease)    History of Mitral valve prolapse    Moderate - with moderate MR, noted February t 2013   History of radiation therapy    Right Pelvis & left  humerus- 12/05/21-12/16/21- Dr. Lynwood Nasuti   History of Severe mitral regurgitation by prior echocardiogram 02/06/2014   TEE: Severe mitral regurgitation with a flail P2 segment and ruptured; Normal LV size & function, dilated LA.   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    Past Surgical History: Past Surgical History:  Procedure Laterality Date   ANTERIOR CRUCIATE LIGAMENT REPAIR Left 1993   COLONOSCOPY     CORONARY BALLOON ANGIOPLASTY N/A 03/10/2024   Procedure: CORONARY BALLOON ANGIOPLASTY;  Surgeon: Anner Alm ORN, MD;  Location: [facility] INVASIVE CV LAB;  Service: Cardiovascular;  Laterality: N/A;   INTRAOPERATIVE TRANSESOPHAGEAL ECHOCARDIOGRAM N/A 04/08/2014   Procedure: INTRAOPERATIVE TRANSESOPHAGEAL ECHOCARDIOGRAM;  Surgeon: Sudie VEAR Laine, MD;  Location: [facility] OR;  Service: Open Heart Surgery;  Laterality: N/A;   IR RADIOLOGIST EVAL & MGMT  10/04/2022   IR RADIOLOGIST EVAL & MGMT  11/01/2022   IR RADIOLOGIST EVAL & MGMT  12/06/2022   IR RADIOLOGIST EVAL & MGMT  01/23/2023   LEFT AND RIGHT HEART CATHETERIZATION WITH CORONARY ANGIOGRAM N/A 03/05/2014   Procedure: LEFT AND RIGHT HEART CATHETERIZATION WITH CORONARY ANGIOGRAM;  Surgeon: Alm ORN Anner, MD;  Location: [facility] CATH LAB;  Service: Cardiovascular; (pre-op) Widely patent LAD stents, 40% distal LAD, 30% Cx, ~50% PL-PDA  bifurcation lesion    LEFT HEART CATH AND CORONARY ANGIOGRAPHY  2004   In setting of anterior STEMI, found to have 100% % proximal LAD (2 DES Cypher stents)   LEFT HEART CATH AND CORONARY ANGIOGRAPHY  2007   Widely patent LAD stents, 40% distal LAD, 30% Cx, ~50% PL-PDA bifurcation lesion    LEFT HEART CATH AND CORONARY ANGIOGRAPHY N/A 03/10/2024   Procedure: LEFT HEART CATH AND CORONARY ANGIOGRAPHY;  Surgeon: Anner Alm ORN, MD;  Location: [facility] INVASIVE CV LAB;  Service: Cardiovascular;  Laterality: N/A;   LEFT HEART CATHETERIZATION WITH CORONARY ANGIOGRAM N/A 09/15/2011   Procedure: LEFT HEART CATHETERIZATION WITH CORONARY ANGIOGRAM;  Surgeon: Dorn JINNY Lesches, MD;  Location: [facility] CATH LAB;  Service: Cardiovascular;  Laterality: N/A;   MITRAL VALVE REPAIR Right 04/08/2014   Procedure: MINIMALLY INVASIVE MITRAL VALVE REPAIR (MVR);  Surgeon: Sudie VEAR Laine, MD;  Location: [facility]  OR;  Service: Open Heart Surgery;  Laterality: Right;   NM MYOVIEW  LTD  12/2009   Walk 9 minutes, and 10 METs, diaphragmatic attenuation but no ischemia or infarction.   SVT ABLATION N/A 10/17/2019   Procedure: SVT ABLATION;  Surgeon: Inocencio Soyla Lunger, MD;  Location: [facility] INVASIVE CV LAB;  Service: Cardiovascular;  Laterality: N/A;   TEE WITHOUT CARDIOVERSION N/A 02/06/2014   Procedure: TRANSESOPHAGEAL ECHOCARDIOGRAM (TEE);  Surgeon: Vinie KYM Maxcy, MD;  Normal LV Size U& function - EF 55-60%, no regional WMA.  MV P2 Leaflet is flail with ruptured chord with severe prolapse, anterior leaflet intact.  Severe, eccentric anterior directed MR with dilated LA.   TRANSTHORACIC ECHOCARDIOGRAM  07/18/2018   Normal LV size and function.  EF 50-60 %.  Unable to assess diastolic function.  Mitral valve sewing ring in place.  Mild stenosis with a gradient of 6 mmHg noted. -   TRANSTHORACIC ECHOCARDIOGRAM  09/30/2021   Stable findings.  EF 50 to 55%.  No RWMA.  Mitral valve stable.  No significant MR.  Normal aortic valve.  Mild aortic  dilation, but probably normal for age.   VIDEO BRONCHOSCOPY WITH ENDOBRONCHIAL ULTRASOUND N/A 05/16/2019   Procedure: VIDEO BRONCHOSCOPY WITH ENDOBRONCHIAL ULTRASOUND;  Surgeon: Mannam, Praveen, MD;  Location: [facility] OR;  Service: Pulmonary;  Laterality: N/A;    Family History: Family History  Problem Relation Age of Onset   Hypertension Mother    Heart Problems Father        triple bypass 1989   Cancer Paternal Grandmother        Pancreatic    Social History: Social History   Socioeconomic History   Marital status: Married    Spouse name: Not on file   Number of children: Not on file   Years of education: Not on file   Highest education level: Not on file  Occupational History   Not on file  Tobacco Use   Smoking status: Never   Smokeless tobacco: Never  Vaping Use   Vaping status: Never Used  Substance and Sexual Activity   Alcohol use: Yes    Alcohol/week: 10.0 standard drinks of alcohol    Types: 5 Cans of beer, 5 Shots of liquor per week    Comment: social   Drug use: No   Sexual activity: Not on file  Other Topics Concern   Not on file  Social History Narrative   He is a married father of one. Exercises avidly as noted above - runs routinely at least 3 miles 3-4 times a day.  He drinks his Xango fruit juce - 32 Oz. Daily.     Never smoked and only takes occasional alcohol   Social Drivers of Corporate investment banker Strain: Not on file  Food Insecurity: No Food Insecurity (03/11/2024)   Hunger Vital Sign    Worried About Running Out of Food in the Last Year: Never true    Ran Out of Food in the Last Year: Never true  Transportation Needs: No Transportation Needs (03/11/2024)   PRAPARE - Administrator, Civil Service (Medical): No    Lack of Transportation (Non-Medical): No  Physical Activity: Not on file  Stress: Not on file  Social Connections: Socially Integrated (03/10/2024)   Social Connection and Isolation Panel    Frequency of  Communication with Friends and Family: More than three times a week    Frequency of Social Gatherings with Friends and Family: More than three times  a week    Attends Religious Services: More than 4 times per year    Active Member of Clubs or Organizations: Yes    Attends Banker Meetings: More than 4 times per year    Marital Status: Married    Allergies:  Allergies  Allergen Reactions   A-Cillin [Ampicillin] Shortness Of Breath, Swelling and Rash    Did it involve swelling of the face/tongue/throat, SOB, or low BP? Yes Did it involve sudden or severe rash/hives, skin peeling, or any reaction on the inside of your mouth or nose? Yes Did you need to seek medical attention at a hospital or doctor's office? Yes When did it last happen? ~20 years ago If all above answers are NO, may proceed with cephalosporin use.     Amoxicillin Hives, Shortness Of Breath and Swelling    Did it involve swelling of the face/tongue/throat, SOB, or low BP? Yes Did it involve sudden or severe rash/hives, skin peeling, or any reaction on the inside of your mouth or nose? Yes Did you need to seek medical attention at a hospital or doctor's office? Yes When did it last happen? ~20 years ago If all above answers are NO, may proceed with cephalosporin use.    Other Hives, Shortness Of Breath and Swelling    ALL CILLINS   Penicillins Hives, Shortness Of Breath and Swelling    Did it involve swelling of the face/tongue/throat, SOB, or low BP? Yes Did it involve sudden or severe rash/hives, skin peeling, or any reaction on the inside of your mouth or nose? Yes Did you need to seek medical attention at a hospital or doctor's office? Yes When did it last happen? ~20 years ago If all above answers are NO, may proceed with cephalosporin use.     Sulfa Antibiotics Nausea Only   Crestor  [Rosuvastatin ] Other (See Comments)    G I upset    Objective:    Vital Signs:   Temp:  [97.4 F (36.3  C)-98.1 F (36.7 C)] 97.9 F (36.6 C) (08/04 0656) Pulse Rate:  [72-95] 73 (08/04 0721) Resp:  [8-19] 10 (08/04 0721) BP: (101-119)/(72-85) 101/73 (08/04 0720) SpO2:  [95 %-100 %] 96 % (08/04 0721) Last BM Date : 03/15/24  Weight change: Filed Weights   03/10/24 1212 03/11/24 0141  Weight: 79 kg 79 kg    Intake/Output:   Intake/Output Summary (Last 24 hours) at 03/17/2024 1607 Last data filed at 03/17/2024 1558 Gross per 24 hour  Intake 5277.77 ml  Output 2565 ml  Net 2712.77 ml      Physical Exam    General:  Sedated on vent. Cor: Regular rate & rhythm. Sternal incision and mediastinal chest tube Lungs: + chest tubes, breathing nonlabored Abdomen: soft, nondistended.  Extremities: 1-2 + diffuse edema   Telemetry   V paced 80  Labs   Basic Metabolic Panel: Recent Labs  Lab 03/11/24 0422 03/12/24 0444 03/14/24 0456 03/17/24 0503 03/17/24 0751 03/17/24 1208 03/17/24 1310 03/17/24 1313 03/17/24 1400 03/17/24 1435 03/17/24 1438 03/17/24 1501 03/17/24 1549 03/17/24 1552  NA 141 141 142 139   < > 136 141   < > 140 142 143 145 143 145  K 3.3* 3.7 3.4* 3.7   < > 6.8* 5.3*   < > 5.9* 4.9 4.9 4.0 3.9 4.0  CL 107 111 111 109   < > 107 102  --  106 107  --   --  106  --   CO2 23  22 23 22   --   --   --   --   --   --   --   --   --   --   GLUCOSE 149* 102* 95 94   < > 216* 224*  --  179* 157*  --   --  165*  --   BUN 12 11 9 14    < > 11 11  --  11 11  --   --  9  --   CREATININE 0.75 0.71 0.78 0.71   < > 0.60* 0.60*  --  0.50* 0.50*  --   --  0.50*  --   CALCIUM  9.2 9.2 9.1 9.4  --   --   --   --   --   --   --   --   --   --    < > = values in this interval not displayed.    Liver Function Tests: No results for input(s): AST, ALT, ALKPHOS, BILITOT, PROT, ALBUMIN  in the last 168 hours. No results for input(s): LIPASE, AMYLASE in the last 168 hours. No results for input(s): AMMONIA in the last 168 hours.  CBC: Recent Labs  Lab  03/13/24 0430 03/14/24 0456 03/15/24 0438 03/16/24 0410 03/17/24 0503 03/17/24 0751 03/17/24 1049 03/17/24 1051 03/17/24 1328 03/17/24 1331 03/17/24 1435 03/17/24 1438 03/17/24 1501 03/17/24 1549 03/17/24 1552  WBC 4.5 4.2 4.8 4.7 5.1  --   --   --   --   --   --   --   --   --   --   HGB 11.7* 11.5* 12.4* 12.3* 12.9*   < > 7.9*   < > 8.3*   < > 8.2* 8.8* 7.8* 8.8* 8.8*  HCT 34.8* 33.9* 37.0* 36.3* 39.4   < > 23.8*   < > 24.5*   < > 24.0* 26.0* 23.0* 26.0* 26.0*  MCV 94.3 93.4 94.4 94.5 95.6  --   --   --   --   --   --   --   --   --   --   PLT 138* 144* 156 159 187  --  116*  --  77*  --   --   --   --   --   --    < > = values in this interval not displayed.    Cardiac Enzymes: No results for input(s): CKTOTAL, CKMB, CKMBINDEX, TROPONINI in the last 168 hours.  BNP: BNP (last 3 results) No results for input(s): BNP in the last 8760 hours.  ProBNP (last 3 results) No results for input(s): PROBNP in the last 8760 hours.   CBG: No results for input(s): GLUCAP in the last 168 hours.  Coagulation Studies: Recent Labs    03/16/24 0410 03/17/24 1446  LABPROT 14.1 26.5*  INR 1.0 2.3*     Imaging   No results found.   Medications:     Current Medications:  [START ON 03/18/2024] acetaminophen   1,000 mg Oral Q6H   Or   [START ON 03/18/2024] acetaminophen  (TYLENOL ) oral liquid 160 mg/5 mL  1,000 mg Per Tube Q6H   acetaminophen  (TYLENOL ) oral liquid 160 mg/5 mL  650 mg Per Tube Once   aspirin   324 mg Oral Once   [START ON 03/18/2024] aspirin  EC  325 mg Oral Daily   Or   [START ON 03/18/2024] aspirin   324 mg Per Tube Daily   atorvastatin   80 mg Oral Daily   [  START ON 03/18/2024] bisacodyl   10 mg Oral Daily   Or   [START ON 03/18/2024] bisacodyl   10 mg Rectal Daily   chlorhexidine   15 mL Mouth/Throat NOW   Chlorhexidine  Gluconate Cloth  6 each Topical Daily   Chlorhexidine  Gluconate Cloth  6 each Topical Daily   [START ON 03/18/2024] docusate sodium   200 mg  Oral Daily   metoCLOPramide  (REGLAN ) injection  10 mg Intravenous Q6H   metoprolol  tartrate  12.5 mg Oral BID   Or   metoprolol  tartrate  12.5 mg Per Tube BID   mouth rinse  15 mL Mouth Rinse 4 times per day   [START ON 03/19/2024] pantoprazole   40 mg Oral Daily   pantoprazole  (PROTONIX ) IV  40 mg Intravenous QHS   [START ON 03/18/2024] sodium chloride  flush  3 mL Intravenous Q12H    Infusions:  sodium chloride      [START ON 03/18/2024] sodium chloride      sodium chloride      albumin  human     dexmedetomidine  (PRECEDEX ) IV infusion     epinephrine      insulin      lactated ringers      lactated ringers      [START ON 03/18/2024] levofloxacin  (LEVAQUIN ) IV     magnesium  sulfate     phenylephrine  (NEO-SYNEPHRINE) Adult infusion     potassium chloride      vancomycin         Patient Profile   68 y.o. male with history of CAD, severe MR s/p minimally invasive mitral valve repair, stage III NSCLC.  Patient admitted with NSTEMI and new systolic CHF. Underwent 3v CABG X 3 c/b ascending aortic dissection and post cardiotomy shock.  Assessment/Plan   Post-cardiotomy shock -Echo 03/10/24: EF 30-35%, anterior WMA, RV mildly reduced -CABG X 3 c/b ascending aortic dissection repaired with Hemashield Platinum graft -Post-op he is on 0.125 milrinone , 15 NE, 3 epi. Vaso off. CI of 3 by TD. -Continue to follow hemodynamics via Swan -Will eventually need diuresis  2. NSTEMI with known CAD -Remote anterior STEMI with prior stents to LAD -NSTEMI - 3 V CAD on cath with 99% in-stent restenosis in LAD, 50% stenosis post PTCA -CABG X 3 03/17/24 as above -Aspirin  + statin.   3. Acute systolic CHF/ICM -Newly reduced EF -Echo 03/10/24: EF 30-35%, anterior WMA, RV mildly reduced -GDMT once off inotrope/pressor support  4. Severe MR s/p mitral valve repair -trivial MR with mean gradient of 4 mmhg on echo this admit  5. Postop anemia/thrombocytopenia -Multiple blood products in OR  -1 u RBCs  running -Transfuse as needed  6. NSVT -Continue amiodarone  gtt while on inotropes  8. NSCLC w/bone mets -S/p chemoradiation and immunotherapy -Stable disease on most recent imaging. Followed by Heme/Onc  Length of Stay: 7  FINCH, LINDSAY N, PA-C  03/17/2024, 4:07 PM    Advanced Heart Failure Team Pager [phone] (M-F; 7a - 5p)  Please contact [facility] for night-coverage after hours (4p -7a ) and weekends on [email or web address]   Agree with above  68 y/o male with lung CA, CAD s/p previous anterior MI, h/o MVR.   Admitted with NSTEMI. Underwent CABG today and procedure c/b aortic dissection requiring Bentall and resuspenison of AoV,   On arrival back to ICU patient intubated/sedated. Hemodynamically stable on milrinone , NE, epi. Had episode of VT that broke spontaneously. Now on amio. Persistent ooze from CTs.   General:  Intubated/sedated HEENT: + ETT Neck:RIJ swan Cor Regular rate & rhythm. No rubs, gallops or murmurs. Sternal  dressing + CTs Lungs: coarse Abdomen: soft,obese quiet Extremities: 1+ edema Neuro: intubated/sedated  He is immediate post-op s/p CABG c/b aortic dissection requiring repair. Hemodynamics look good on current support. Agree with amio for VT. Can add lido as needed. Still with persistent chest ooze. Awaiting TEG. Can give Factor VII as needed.Replace products as needed.   Echo in am   Case d/w CVTS and CCM at bedside  CCT 60 mins  Toribio Fuel, MD  9:11 PM

## 2024-03-17 NOTE — Discharge Summary (Addendum)
 [address] [place] [address]             [phone]        Physician Discharge Summary  Patient ID: Alan [name] MRN: [id number] DOB/AGE: [birth year] 68 y.o.  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   NSTEMI (non-ST elevated myocardial infarction) (HCC) 03/10/2024   Metastasis to bone (HCC) 11/14/2021   Acid reflux 10/29/2019   Preop cardiovascular exam 08/13/2019   SVT (supraventricular tachycardia) (HCC) 07/01/2019   Elevated troponin level 07/01/2019   Adenocarcinoma of right lung, stage 3 (HCC) 05/29/2019   Goals of care, counseling/discussion 05/29/2019   Encounter for antineoplastic chemotherapy 05/29/2019   Lung mass    S/P minimally invasive mitral valve repair 04/08/2014   Incidental lung nodule, greater than or equal to 8mm 03/16/2014   CAD S/P percutaneous coronary angioplasty - DES PCI to occluded LAD during anterior STEMI 11/29/2013   History of Severe Mitral valve prolapse - severe prolapse of the posterior (P2) leaflet with severe MR 11/29/2013   Essential hypertension    Dyslipidemia, goal LDL below 70    Enthesopathy of ankle and tarsus 03/24/2009     Discharge Diagnoses:  Patient Active Problem List   Diagnosis Date Noted   Palliative care by specialist 03/29/2024   Acute respiratory failure with hypoxia (HCC) 03/27/2024   Pleural effusion 03/25/2024   Pneumothorax 03/25/2024   On mechanically assisted ventilation (HCC)  03/22/2024   Pneumonia of right lower lobe due to infectious organism 03/22/2024   Pressure injury of skin 03/22/2024   Malnutrition of moderate degree 03/21/2024   S/P CABG x 3 03/17/2024   NSTEMI (non-ST elevated myocardial infarction) (HCC) 03/10/2024   Metastasis to bone (HCC) 11/14/2021   Acid reflux 10/29/2019   Preop cardiovascular exam 08/13/2019   SVT (supraventricular tachycardia) (HCC) 07/01/2019   Elevated troponin level 07/01/2019   Adenocarcinoma of right lung, stage 3 (HCC) 05/29/2019   Goals of care, counseling/discussion 05/29/2019   Encounter for antineoplastic chemotherapy 05/29/2019   Lung mass    S/P minimally invasive mitral valve repair 04/08/2014   Incidental lung nodule, greater than or equal to 8mm 03/16/2014   CAD S/P percutaneous coronary angioplasty - DES PCI to occluded LAD during anterior STEMI 11/29/2013   History of Severe Mitral valve prolapse - severe prolapse of the posterior (P2) leaflet with severe MR 11/29/2013   Essential hypertension    Dyslipidemia, goal LDL below 70    Enthesopathy of ankle and tarsus 03/24/2009     Discharged Condition: serious  Referring: No ref. provider found Primary Care: Leonel Cole, MD Primary Cardiologist:David Anner, MD   History of Present Illness: Alan [name] is a 68 year old male with a past medical history of CAD (s/p STEMI with PCI and DES to LAD 2004) followed by Dr. Anner, HTN, HLD, mitral valve prolapse (s/p mini mitral valve repair utilizing a 34mm Sorin Memo 3D Reochord ring annuloplasty by Dr. Dusty  2015), SVT (s/p ablation 2021), and metastatic lung cancer (s/p palliative radiotherapy, now stable). The patient presented to the ED on 07/26 due to intermittent chest pain over the last week, he reports it started last Monday but he thought it was muscle soreness due to him starting to lift weights. The night of 07/25 he had chest pain that was evaluated by EMS but eventually resolved so he was not  transported to the ED. On 07/26 he noted centralized sharp substernal chest pain will dull radiation to the rest of his body and numbness of bilateral arms to his elbows. He denies nausea and vomiting, shortness of breath, diaphoresis, and LOC. Nitroglycerin  provided some relief. He presented to the [facility] ED on 07/26 but was discharged home since troponin levels were flat and he had atypical chest discomfort. The chest pain again worsened on 07/28 so he presented to urgent care and was directed to [facility] ED due to EKG with new T wave inversions. Troponin I (high sensitivity) peaked at 1469, he was ruled in for NSTEMI. Cardiac catheterization 03/10/24 showed 99% LAD in stent restenosis with 60% residual stenosis after balloon angioplasty, mid LAD 80% stenosis with residual 50% stenosis after balloon angioplasty, questionable ostial left circumflex 60-70% stenosis, RCA with ulcerated plaque and focal concentric heavily calcified 70% stenosis and the remainder of the RCA with mild diffuse disease. He was started on Cangrelor  and heparin  post PCI. The patient was given Effient  on 07/28 for cardiac catheterization and later started on Plavix  but it does not look like he was given a dose of Plavix , this has been discontinued. Echocardiogram on 07/28 showed LVEF 30-35%, left ventricle with moderately decreased function, mildly reduced right ventricular systolic function, mitral valve repair with trivial mitral valve regurgitation, and trivial aortic valve regurgitation no other valvular abnormalities noted.    The patient lives with his wife in a 2 story home but the primary bedroom is on the first floor. He is retired but previously worked in [employer] traffic [employer]. His last workout was 1 week ago and he overall remains active. He reports his cancer has remained stable for the last few years and he continues to follow up with oncology with CT scans. He does report he has developed some depression  since the cancer diagnosis and treatment and was started on a medication recently but developed the chest pain soon after starting the medication so he stopped it and does not remember the name. The patient reports he is worried about going back on a heart lung machine.   Following full review of the patient and all relevant studies Dr. Kerrin recommended proceeding with CABG.  He continued to be medically stabilized.  Hospital course:  Following full diagnostic evaluation and medical stabilization he was felt to b stable to proceed with surgery and on 03/17/2024 he was taken the operating room at which time he underwent a CABG x 3.  LIMA-LAD, SVG-posterior descending and SVG-obtuse marginal grafts were placed.  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  He required replacement of Ascending Aortic Hemiarch with resuspension of the Aortic Valve under hypothermic circulatory arrest.  He tolerated the procedure and was taken to the SICU in critical condition.  Critical care consult was obtained to help with patient management/ventilatory wean. The patient had runs of VT and Atrial Fibrillation with RVR post operatively.  He was treated with IV Amiodarone .  The patient exhibited seizure like activity. Head CT was obtained and  showed no acute abnormality, mild to moderate periventricular deep cerebral volume loss, moderate generalized cerebral volume loss.  EEG was obtained and showed LPDs.  Neurology consult was obtained and recommended MRI once sternal wires were removed and he was started on Keppra . Myoclonic jerking continued upon reduction of sedation. MRI brain done 03/20/2024 showed multiple small infarcts involving different vascular territories and multiple small foci of chronic microhemorrhage (more numerous than in 2024). He was weaned off Milrinone , Levophed , Epinephrine  as hemodynamics allowed.  On post op day 3 he was moving all 4 extremities and following commands,  although he was profoundly weak. He was started on tube feedings and on Protonix  bid. He had thrombocytopenia post op. Platelets went as low as 41,000. He had no signs of active bleeding and was no on Lovenox . Amiodarone  was stopped on 08/07. Milrinone  was weaned as hemodynamics tolerated.  Ultimately all pressors and inotropes were discontinued.  He developed pneumonia and was started on Levofloxacin  and Vancomycin . Vent was weaned to extubation on 08/10. He had an air leak so chest tube was left in place. He developed a small subpulmonic right effusion and an additional chest tube was placed.  He subsequently has required reintubation.  He developed sepsis with fevers felt most likely a pulmonary source.  On 03/27/2024 he underwent percutaneous tracheostomy.  There is some question as to whether he could possibly have a T-E fistula.  The tracheostomy tube was placed past the abnormal finding.  The patient continued weaning efforts with trach collar trials.  Postoperative cardiogenic shock is not felt to be resolved.  Ventilator weaning is in progress but it is felt that he would benefit from an LTAC facility for ongoing management.  He has completed a course of linezolid  in addition to aforementioned antibiotics.  He also was placed on an antifungal for a course.  Ultimately the pneumonia was determined to be a staph epi.  He has had a hyper natremia which is showing some improvement and he has been given free water .  OT and PT have been initiated as able but very limited due to his current state.  He is noted to have a right groin AV fistula as well as lower extremity mottling with Doppler signals in his posterior tibial.  Vascular surgery was consulted and conservative management is being used with watchful waiting/monitoring.  He is treated with tube feeds for nutrition at this time.  Had review of his current status and he is felt to be stable for transfer to select LTAC.  It is noted he has been seen by  palliative care but currently family is pursuing maximal therapies.  Consults: cardiology, pulmonary/intensive care, neurology, vascular surgery, and palliative  Significant Diagnostic Studies: DG CHEST PORT 1 VIEW Result Date: 03/31/2024 CLINICAL DATA:  Pleural effusion. EXAM: PORTABLE CHEST 1 VIEW COMPARISON:  Radiograph 03/29/2024, CT 03/26/2024 FINDINGS: Tracheostomy tube remains in place. Weighted enteric tube remains in place. Left upper extremity PICC tip overlies the SVC. Right basilar pigtail catheter coiled at the lung base. Stable right basilar opacity which may represent pleural fluid and/or airspace disease/atelectasis. No convincing pneumothorax. Medial left lung base opacity is similar. Prior median sternotomy with stable heart size and mediastinal contours. Small left pleural effusion. IMPRESSION: 1. Stable right basilar opacity which may represent pleural fluid and/or airspace disease/atelectasis. Right basilar pigtail catheter in place. No convincing pneumothorax. 2. Stable medial left lung base opacity. Small left pleural effusion. Electronically Signed   By: Andrea Gasman M.D.   On:  03/31/2024 12:58   DG Chest Port 1 View Result Date: 03/29/2024 CLINICAL DATA:  Chest tube in place. EXAM: PORTABLE CHEST 1 VIEW COMPARISON:  03/29/2024. FINDINGS: The heart size and mediastinal contours are stable. There is atherosclerotic calcification of the aorta. A right-sided chest tube is in place with a small right apical pneumothorax. Patchy airspace disease is noted in the mid to lower lung fields bilaterally, slightly improved on the right. No definite effusion is seen. Sternotomy wires are present over the midline. A left PICC line terminates over the right atrium. A tracheostomy tube terminates 2.9 cm above the carina. An enteric tube courses over the left upper quadrant and out of the field of view. IMPRESSION: 1. Right-sided chest tube in place with small apical pneumothorax on the right. 2.  Patchy airspace disease at the lung bases, improved on the right. 3. Remaining support apparatus as described above. Electronically Signed   By: Leita Birmingham M.D.   On: 03/29/2024 13:53   DG Chest Port 1 View Result Date: 03/29/2024 CLINICAL DATA:  Right lung adenocarcinoma, pneumothorax EXAM: PORTABLE CHEST 1 VIEW COMPARISON:  03/28/2024 FINDINGS: Two frontal views of the chest demonstrates stable tracheostomy tube, enteric catheter, left-sided PICC. A right sided pigtail pleural drainage catheter is in grossly stable position. Cardiac silhouette is stable. There is progressive consolidation at the lung bases, right greater than left, which may reflect worsening atelectasis or edema. Increased veiling density at the right lung base consistent with enlarging right pleural effusion. No evidence of pneumothorax. No acute bony abnormalities. IMPRESSION: 1. Progressive bibasilar consolidation which may reflect progressive atelectasis or edema. 2. Increased right pleural effusion. 3. Stable support devices. Electronically Signed   By: Ozell Daring M.D.   On: 03/29/2024 09:09   DG Chest Port 1 View Result Date: 03/28/2024 EXAM: 1 VIEW XRAY OF THE CHEST 03/28/2024 05:35:00 AM COMPARISON: AP radiograph of the chest dated 03/27/2024. CLINICAL HISTORY: S/P CABG (coronary artery bypass graft); Hx of adenocarcinoma of R lung (stage 3). FINDINGS: LUNGS AND PLEURA: There are hazy and streaky opacities present within the right lung base. There are also lesser hazy reticular opacities on the left. No pleural effusion. No pneumothorax. HEART AND MEDIASTINUM: The patient is again noted to be status post CABG. No acute abnormality of the cardiac and mediastinal silhouettes. BONES AND SOFT TISSUES: There is elevation of the right hemidiaphragm. No acute osseous abnormality. LINES AND TUBES: A right-sided pigtail catheter remains in place. A tracheostomy tube, left arm PICC and feeding tube are also again demonstrated.  IMPRESSION: 1. Hazy and streaky opacities in the right lung base and lesser hazy reticular opacities on the left. 2. Elevation of the right hemidiaphragm. 3. Right-sided pigtail catheter, tracheostomy tube, left arm PICC, and feeding tube in place. Electronically signed by: evalene coho 03/28/2024 08:32 AM EDT RP Workstation: [id number]   DG Chest Port 1 View Result Date: 03/27/2024 EXAM: 2 VIEWS XRAY OF THE CHEST 03/27/2024 12:59:00 PM COMPARISON: 03/27/2024 05:57:00 AM CLINICAL HISTORY: Status post tracheostomy (HCC) FINDINGS: LUNGS AND PLEURA: Persistent bibasilar atelectasis. Mild pulmonary venous congestion. No focal pulmonary opacity. No pulmonary edema. No pleural effusion. No pneumothorax. HEART AND MEDIASTINUM: Median sternotomy for CABG. No acute abnormality of the cardiac and mediastinal silhouettes. BONES AND SOFT TISSUES: No acute osseous abnormality. LINES AND TUBES: Endotracheal tube terminates 4.9 cm above carina. Feeding tube terminates beyond the inferior aspect of the film. Left-sided PICC line terminates at the superior caval/atrial junction. Right-sided pigtail pleural catheter is similar in  position. IMPRESSION: 1. No residual pneumothorax with right-sided pleural catheter in place. 2.  bibasilar atelectasis with mild pulmonary venous congestion. Electronically signed by: Rockey Kilts MD 03/27/2024 01:09 PM EDT RP Workstation: [id number]   DG CHEST PORT 1 VIEW Result Date: 03/27/2024 CLINICAL DATA:  [id number] Endotracheal tube present [id number] EXAM: PORTABLE CHEST - 1 VIEW COMPARISON:  March 26, 2024 FINDINGS: Endotracheal tube terminates in the mid trachea. Left PICC terminates in the high right atrium. Small bore right-sided thoracostomy tube coiled in the right lung base. Large-bore left-sided thoracostomy tube terminates in the left mid lung region. Lower lung volumes with similar bibasilar airspace opacities. No pleural effusion. Weighted feeding tube courses below the diaphragm  with the distal tip not included in the field of view. Mild cardiomegaly. Sternotomy wires and CABG markers. IMPRESSION: 1. Trace right pneumothorax, less prominent than on the prior exam. No left pneumothorax. Otherwise, little significant change to the lungs. 2. Similar positioning of the support tubes and lines, as described above. Electronically Signed   By: Rogelia Myers M.D.   On: 03/27/2024 09:58   CT CHEST ABDOMEN PELVIS WO CONTRAST Result Date: 03/27/2024 CLINICAL DATA:  Possible sepsis EXAM: CT CHEST, ABDOMEN AND PELVIS WITHOUT CONTRAST TECHNIQUE: Multidetector CT imaging of the chest, abdomen and pelvis was performed following the standard protocol without IV contrast. RADIATION DOSE REDUCTION: This exam was performed according to the departmental dose-optimization program which includes automated exposure control, adjustment of the mA and/or kV according to patient size and/or use of iterative reconstruction technique. COMPARISON:  12/26/2023 FINDINGS: CT CHEST FINDINGS Cardiovascular: Somewhat limited due to lack of IV contrast. Atherosclerotic calcifications of the aorta are noted. No aneurysmal dilatation is seen. The heart is not significantly enlarged in size. Heavy coronary calcifications are noted. Left PICC is noted in satisfactory position. Calcifications are noted scattered within the lumen of the descending thoracic aorta suspicious for chronic dissection. No hyperdense crescent is seen. Mediastinum/Nodes: Thoracic inlet shows endotracheal tube and feeding catheter in satisfactory position. The thyroid  is unremarkable. The esophagus as visualized is within normal limits. No hilar or mediastinal adenopathy is noted. Lungs/Pleura: Lungs are well aerated bilaterally with the exception of right perihilar architectural distortion similar to that seen on the recent exam consistent with prior none. New right lower lobe consolidation is noted consistent with acute pneumonia. No sizable effusion  is seen. Pigtail catheter on the right is noted without evidence of pneumothorax. Similar findings to a lesser degree in the left lower lobe are noted. Patchy ground-glass opacities are noted in the right upper lobe also likely postinflammatory in nature. Large bore chest tube is noted on the left with some associated adjacent atelectatic changes. No pneumothorax is seen. Mild subcutaneous air is noted on the left. Musculoskeletal: No acute bony abnormality is noted. Changes consistent with subcutaneous air are noted in the left chest wall. CT ABDOMEN PELVIS FINDINGS Hepatobiliary: No focal liver abnormality is seen. No gallstones, gallbladder wall thickening, or biliary dilatation. Pancreas: Unremarkable. No pancreatic ductal dilatation or surrounding inflammatory changes. Spleen: Normal in size without focal abnormality. Adrenals/Urinary Tract: Adrenal glands are within normal limits. Kidneys show no renal calculi or obstructive changes. The ureters are within normal limits to the bladder. Bladder is decompressed by Foley catheter. Stomach/Bowel: Rectal wall thickening is noted which may represent some mild proctitis. Fecal material is noted throughout the colon without obstructive change. Feeding catheter is noted within the stomach. The appendix is within normal limits. Small bowel and stomach are unremarkable. Vascular/Lymphatic:  Atherosclerotic calcifications of the aorta are noted. No lymphadenopathy is identified. Reproductive: Prostate is unremarkable. Other: No abdominal wall hernia or abnormality. No abdominopelvic ascites. Musculoskeletal: Stable scattered sclerotic foci noted within the bony structures consistent with metastatic disease. IMPRESSION: CT of the chest: Calcifications within the lumen of the descending thoracic aorta suspicious for previous dissection. This was not seen on the prior preoperative exam however was encountered during coronary bypass grafting on 03/17/2024. The need for further  imaging can be determined on a clinical basis. Extensive bilateral lower lobe consolidation consistent with acute multifocal pneumonia. Chest tubes are noted bilaterally without evidence of pneumothorax. Patchy ground-glass opacities new in the right upper lobe also likely postinflammatory in nature. CT of the abdomen and pelvis: Rectal wall thickening suspicious for proctitis. Stable changes consistent with prior metastatic disease in the bones. Electronically Signed   By: Oneil Devonshire M.D.   On: 03/27/2024 00:57   CT HEAD WO CONTRAST ( ) Result Date: 03/26/2024 CLINICAL DATA:  Follow-up examination for stroke. EXAM: CT HEAD WITHOUT CONTRAST TECHNIQUE: Contiguous axial images were obtained from the base of the skull through the vertex without intravenous contrast. RADIATION DOSE REDUCTION: This exam was performed according to the departmental dose-optimization program which includes automated exposure control, adjustment of the mA and/or kV according to patient size and/or use of iterative reconstruction technique. COMPARISON:  Prior MRI from 03/20/2024 FINDINGS: Brain: Cerebral volume within normal limits. Previously identified small ischemic infarcts not visible by CT. No acute intracranial hemorrhage. No other acute large vessel territory infarct. No mass lesion or midline shift. No hydrocephalus or extra-axial fluid collection. Vascular: No abnormal hyperdense vessel. Skull: Scalp soft tissues within normal limits.  Calvarium intact. Sinuses/Orbits: Globes orbital soft tissues demonstrate no acute finding. Suspected small optic drusen body at the posterior left lobe. Paranasal sinuses are largely clear. Is a gastric tube in place. Small bilateral mastoid effusions noted, likely related to placement. Other: None. IMPRESSION: 1. No acute intracranial abnormality. 2. Previously identified small scattered ischemic infarcts not visible by CT. Electronically Signed   By: Morene Hoard M.D.   On:  03/26/2024 22:44   EEG adult Result Date: 03/26/2024 Shelton Arlin KIDD, MD     03/26/2024 10:10 AM Patient Name: Alan [name] MRN: [id number] Epilepsy Attending: Arlin KIDD Shelton Referring Physician/Provider: Claudene Alan BROCKS, MD Date: 03/26/2024 Duration: 22.34 mins Patient history: 68yo M with ams. EEG to evaluate for seizure Level of alertness:  comatose AEDs during EEG study: LEV, propofol  Technical aspects: This EEG study was done with scalp electrodes positioned according to the 10-20 International system of electrode placement. Electrical activity was reviewed with band pass filter of 1-70Hz , sensitivity of 7 uV/mm, display speed of 67mm/sec with a 60Hz  notched filter applied as appropriate. EEG data were recorded continuously and digitally stored.  Video monitoring was available and reviewed as appropriate. Description: EEG showed continuous generalized polymorphic low amplitude 3 to 6 Hz theta-delta slowing. Hyperventilation and photic stimulation were not performed.   ABNORMALITY - Continuous slow, generalized IMPRESSION: This study is suggestive of severe diffuse encephalopathy. No seizures or epileptiform discharges were seen throughout the recording. Arlin KIDD Shelton   DG Chest Port 1 View Result Date: 03/26/2024 CLINICAL DATA:  68 year old male with bilateral pneumothorax, chest tubes placed. Intubated, respiratory failure. EXAM: PORTABLE CHEST 1 VIEW COMPARISON:  Portable chest yesterday and earlier. FINDINGS: Portable AP semi upright view at 0555 hours. Right chest tube, other lines and tubes appear stable since yesterday, including enteric feeding tube  which appears to terminates at the distal stomach. Stable lung volumes. Stable cardiac size and mediastinal contours. Small apical pneumothoraces appear stable. New right lower lobe partial collapse since yesterday. Right middle lobe ventilation, left lung ventilation appears stable. Streaky left lower lobe peribronchial opacity persists. No  definite pleural effusion. Nonobstructed visible bowel gas pattern. Stable visualized osseous structures. IMPRESSION: 1. Stable lines and tubes.  Stable small apical pneumothoraces. 2. New/progressive right lower lobe collapse or consolidation since yesterday. Less pronounced streaky left lower lobe opacity is stable. And stable ventilation otherwise. Electronically Signed   By: VEAR Hurst M.D.   On: 03/26/2024 09:50   DG Chest Port 1 View Result Date: 03/25/2024 CLINICAL DATA:  Chest tube placement.  Bilateral pneumothoraces. EXAM: PORTABLE CHEST 1 VIEW COMPARISON:  Radiographs 03/25/2024, 03/24/2024 and 03/23/2024. FINDINGS: 1045 hours. Two views submitted. New small caliber pigtail right pleural drainage catheter projects over the right mid lung. The endotracheal tube, feeding tube, left arm PICC and left chest tube appear unchanged. The heart size and mediastinal contours are stable post CABG. Small right apical pneumothorax appears slightly improved. Small left apical pneumothorax appears unchanged. There is improved aeration of both lung bases with decreased right basilar airspace disease and a probable decreasing right pleural effusion. No acute osseous findings. IMPRESSION: 1. Interval placement of a small caliber right pleural drainage catheter with slight improvement in small right apical pneumothorax. 2. Stable small left apical pneumothorax. 3. Improved aeration of both lung bases with decreased right pleural effusion. Electronically Signed   By: Elsie Perone M.D.   On: 03/25/2024 11:51   DG Chest Port 1 View Result Date: 03/25/2024 CLINICAL DATA:  Pneumonia.  Follow-up exam. EXAM: PORTABLE CHEST 1 VIEW COMPARISON:  03/24/2024 and older studies.  CT, 12/26/2023. FINDINGS: Small right apical pneumothorax, stable from previous day's exam. Mid to lower right and lower left airspace lung opacities are unchanged. No new lung abnormalities. Endotracheal tube, enteric feeding tube and left-sided PICC are  stable in well positioned. IMPRESSION: 1. No change from the previous day's exam. 2. Stable small right apical pneumothorax. 3. No change in bilateral airspace lung opacities, greater on the right. Electronically Signed   By: Alm Parkins M.D.   On: 03/25/2024 09:25   VAS US  LOWER EXTREMITY VENOUS (DVT) Result Date: 03/24/2024  Lower Venous DVT Study Patient Name:  Alan [name]  Date of Exam:   03/24/2024 Medical Rec #: [id number]         Accession #:    [id number] Date of Birth: February 23, 1956          Patient Gender: M Patient Age:   41 years Exam Location:  [facility] Procedure:      VAS US  LOWER EXTREMITY VENOUS (DVT) Referring Phys: Alan SHARPS --------------------------------------------------------------------------------  Indications: Edema. Other Indications: Concern for left lower extremity ischemia - duskiness on the                    left foot. Comparison Study: No priors. Performing Technologist: Ricka Sturdivant-Jones RDMS, RVT  Examination Guidelines: A complete evaluation includes B-mode imaging, spectral Doppler, color Doppler, and power Doppler as needed of all accessible portions of each vessel. Bilateral testing is considered an integral part of a complete examination. Limited examinations for reoccurring indications may be performed as noted. The reflux portion of the exam is performed with the patient in reverse Trendelenburg.  +---------+---------------+---------+-----------+----------+--------------+ RIGHT    CompressibilityPhasicitySpontaneityPropertiesThrombus Aging +---------+---------------+---------+-----------+----------+--------------+ CFV      Full  No       No                                  +---------+---------------+---------+-----------+----------+--------------+ SFJ      Full                                                        +---------+---------------+---------+-----------+----------+--------------+ FV Prox  Full                                                         +---------+---------------+---------+-----------+----------+--------------+ FV Mid   Full                                                        +---------+---------------+---------+-----------+----------+--------------+ FV DistalFull                                                        +---------+---------------+---------+-----------+----------+--------------+ PFV      Full                                                        +---------+---------------+---------+-----------+----------+--------------+ POP      Full           Yes      Yes                                 +---------+---------------+---------+-----------+----------+--------------+ PTV      Full                                                        +---------+---------------+---------+-----------+----------+--------------+ PERO     Full                                                        +---------+---------------+---------+-----------+----------+--------------+   +---------+---------------+---------+-----------+----------+--------------+ LEFT     CompressibilityPhasicitySpontaneityPropertiesThrombus Aging +---------+---------------+---------+-----------+----------+--------------+ CFV      Full           Yes      Yes                                 +---------+---------------+---------+-----------+----------+--------------+ SFJ  Full                                                        +---------+---------------+---------+-----------+----------+--------------+ FV Prox  Full                                                        +---------+---------------+---------+-----------+----------+--------------+ FV Mid   Full                                                        +---------+---------------+---------+-----------+----------+--------------+ FV DistalFull                                                         +---------+---------------+---------+-----------+----------+--------------+ PFV      Full                                                        +---------+---------------+---------+-----------+----------+--------------+ POP      Full           Yes      Yes                                 +---------+---------------+---------+-----------+----------+--------------+ PTV      Full                                                        +---------+---------------+---------+-----------+----------+--------------+ PERO     Full                                                        +---------+---------------+---------+-----------+----------+--------------+ Patent left PTA, PERO and dampened DPA flow.    Summary: BILATERAL: - No evidence of deep vein thrombosis seen in the lower extremities, bilaterally. -No evidence of popliteal cyst, bilaterally.   *See table(s) above for measurements and observations. Electronically signed by Fonda Rim on 03/24/2024 at 6:55:36 PM.    Final    VAS US  GROIN PSEUDOANEURYSM Result Date: 03/24/2024  ARTERIAL PSEUDOANEURYSM  Patient Name:  Alan [name]  Date of Exam:   03/24/2024 Medical Rec #: [id number]         Accession #:    [id number] Date of Birth: 05/17/1956          Patient Gender: M Patient Age:  68 years Exam Location:  [facility] Procedure:      VAS US  QUILLIAN CAPES Referring Phys: JOSHUA ROBINS --------------------------------------------------------------------------------  Exam: Right groin Indications: Patient complains of bruising. History: Postop day 6 CABG X 3 using right saphenous vein complicated by dissection. Performing Technologist: Ricka Sturdivant-Jones RDMS, RVT  Examination Guidelines: A complete evaluation includes B-mode imaging, spectral Doppler, color Doppler, and power Doppler as needed of all accessible portions of each vessel. Bilateral testing is considered an integral part of a complete examination. Limited  examinations for reoccurring indications may be performed as noted. +------------+----------+---------+------+------------------------+ Right DuplexPSV (cm/s)Waveform Plaque       Comment(s)        +------------+----------+---------+------+------------------------+ CFA            182                   Abnormal turbulence flow +------------+----------+---------+------+------------------------+ PFA             45    triphasic                               +------------+----------+---------+------+------------------------+ Prox SFA        66    triphasic                               +------------+----------+---------+------+------------------------+  Findings: There is evidence of an abnormal flow channel between the distal common femoral artey and the distal common femoral vein. Doppler characteristics display turbulence and bidirectional flow at distal common femoral vein. The Distal common femoral artey displays early systolic flow reversal distal to the fistula.  Summary: Small groin hematoma was noted.  Diagnosing physician: Fonda Rim Electronically signed by Fonda Rim on 03/24/2024 at 6:53:54 PM.    --------------------------------------------------------------------------------    Final    DG Chest 1V REPEAT Same Day Result Date: 03/24/2024 CLINICAL DATA:  [id number] Pneumothorax [id number] EXAM: CHEST - 1 VIEW SAME DAY COMPARISON:  March 24, 2024, 12:17 p.m. FINDINGS: Endotracheal tube is similarly positioned terminating in the mid trachea. Weighted feeding tube courses below the diaphragm terminating in the gastric antrum. Left PICC terminates in the lower SVC. Small bilateral pleural effusions with unchanged bibasilar airspace disease. Overall, unchanged right apical pneumothorax, measuring 1.8 cm. The left apical pneumothorax is slightly smaller measuring 9 mm (previously 1.2 cm). Mild cardiomegaly. Sternotomy wires and CABG markers. Mitral valve replacement. Tortuous aorta with  aortic atherosclerosis. IMPRESSION: 1. Similar right apical pneumothorax with slightly smaller left apical pneumothorax, as delineated above. Otherwise, little significant interval change to the lung findings. 2. Similar positioning of the support tubes and lines, as described above. Electronically Signed   By: Rogelia Myers M.D.   On: 03/24/2024 17:30   DG Chest 1V REPEAT Same Day Result Date: 03/24/2024 CLINICAL DATA:  Pneumothorax EXAM: CHEST - 1 VIEW SAME DAY COMPARISON:  Chest radiograph March 24, 2024, 9:28 a.m. FINDINGS: Grossly stable biapical pneumothorax right greater than left, inter pleural distance of 1.9 cm on the right and 1.2 cm on the left. Increased perihilar and bilateral lower lobe pulmonary patchy opacities. Left chest tube remains in place in mid hemi thorax. Cardiomediastinal silhouette is stable. Endotracheal tube terminates 3.3 cm to carina. Left upper extremity PICC terminates in cavoatrial junction. Enteric tube in place tip extending outside the field of view. Status post mitral valve ring annuloplasty. Status post CABG. No acute osseous  abnormality. IMPRESSION: Stable to mildly increased  bilateral lung base patchy opacities. Stable bilateral pneumothoraces. Electronically Signed   By: Megan  Zare M.D.   On: 03/24/2024 13:37   DG Chest Port 1 View Result Date: 03/24/2024 CLINICAL DATA:  Follow up pneumothorax.  Intubated patient. EXAM: PORTABLE CHEST 1 VIEW COMPARISON:  Radiographs 03/23/2024 and 03/22/2024.  CT 12/26/2023. FINDINGS: 0928 hours. Tip of the endotracheal tube is unchanged, approximately 5.8 cm above the carina. A feeding tube projects below the diaphragm, tip not visualized. Left arm PICC projects to the lower SVC level. Left chest tube remains in place. The heart size and mediastinal contours are stable post median sternotomy and CABG. Unchanged small left apical pneumothorax. New small right apical pneumothorax without tension component. Unchanged patchy  airspace opacities at both lung bases, right greater than left. IMPRESSION: 1. New small right apical pneumothorax without tension component. 2. Unchanged small left apical pneumothorax with left chest tube in place. 3. Stable bibasilar airspace opacities, right greater than left. Electronically Signed   By: Elsie Perone M.D.   On: 03/24/2024 10:24   DG CHEST PORT 1 VIEW Result Date: 03/23/2024 CLINICAL DATA:  Follow-up pneumothorax. EXAM: PORTABLE CHEST 1 VIEW COMPARISON:  03/22/2024 FINDINGS: Support lines and tubes remain in appropriate position, including a left-sided chest tube. A small left apical pneumothorax persists. Stable right lower lung volume loss and opacity. Infiltrate in left lung base is also unchanged. Heart size is stable. Prior CABG again noted. IMPRESSION: Stable small left apical pneumothorax. Left chest tube remains in place. Stable left basilar infiltrate. Stable right lower lung volume loss and opacity. Electronically Signed   By: Norleen DELENA Kil M.D.   On: 03/23/2024 10:47   DG Chest Port 1 View Result Date: 03/22/2024 CLINICAL DATA:  Aortic aneurysm and dissection. EXAM: PORTABLE CHEST 1 VIEW COMPARISON:  03/21/2024 and CT chest 12/26/2018. FINDINGS: Endotracheal tube terminates approximately 5.0 cm above the carina. Feeding tube is followed into the stomach with the tip projecting beyond the inferior margin of the image. Left PICC tip is in the low SVC. Left chest tube terminates in the lateral mid left hemithorax. Six midline intact sternotomy wires. Heart size stable. Post radiation scarring in the right perihilar region. Bibasilar airspace opacification, new or increased from 03/21/2024. Trace left apical pneumothorax, decreased from 03/21/2024. No definite right pneumothorax. IMPRESSION: 1. Trace left apical pneumothorax, decreased in size from 03/21/2024, with chest tube in place. 2. No definite right pneumothorax. 3. New/increasing bibasilar airspace opacification may be due  to aspiration. 4. Right perihilar radiation scarring. Electronically Signed   By: Newell Eke M.D.   On: 03/22/2024 10:06   DG CHEST PORT 1 VIEW Result Date: 03/21/2024 EXAM: 1 VIEW XRAY OF THE CHEST 03/21/2024 07:52:00 AM COMPARISON: Chest radiograph dated 03/07/2024. CLINICAL HISTORY: Pneumothorax. FINDINGS: LUNGS AND PLEURA: Right mid and lower lung opacities are slightly decreased. Slightly decreased left basilar opacities. Right apical pneumothorax is slightly decreased. Similar appearance of left apical pneumothorax. Small right pleural effusion. HEART AND MEDIASTINUM: Median sternotomy and sequelae of CABG. Similar appearance of mediastinal drain. BONES AND SOFT TISSUES: No acute osseous abnormality. IMPRESSION: 1. Enteric tube with side hole near the gastroesophageal junction. Consider abdominal radiograph to confirm position. 2. Slightly decreased right apical pneumothorax and similar appearance of left apical pneumothorax. 3. Decreased right mid and lower lung opacities. Slightly decreased left basilar opacities. 4. Small right pleural effusion. Electronically signed by: Donnice Mania MD 03/21/2024 08:48 AM EDT RP Workstation: [id number]  US  EKG SITE RITE Result Date: 03/21/2024 If Site Rite image not attached, placement could not be confirmed due to current cardiac rhythm.  ECHOCARDIOGRAM LIMITED Result Date: 03/20/2024    ECHOCARDIOGRAM LIMITED REPORT   Patient Name:   Alan [name] Date of Exam: 03/20/2024 Medical Rec #:  [id number]        Height:       76.0 in Accession #:    [id number]       Weight:       192.2 lb Date of Birth:  10-26-1955         BSA:          2.178 m Patient Age:    68 years         BP:           140/52 mmHg Patient Gender: M                HR:           98 bpm. Exam Location:  Inpatient Procedure: Limited Echo, Cardiac Doppler, Color Doppler and Intracardiac            Opacification Agent (Both Spectral and Color Flow Doppler were            utilized during procedure).  Indications:    I50.40* Unspecified combined systolic (congestive) and diastolic                 (congestive) heart failure  History:        Patient has prior history of Echocardiogram examinations, most                 recent 03/11/2024. Previous Myocardial Infarction and CAD,                 Abnormal ECG and 3 days post CABG, Arrythmias:SVT; Risk                 Factors:Hypertension and Dyslipidemia. Lung cancer.  Sonographer:    Ellouise Mose RDCS Referring Phys: [id number] [facility] [place]  Sonographer Comments: Technically difficult study due to poor echo windows and echo performed with patient supine and on artificial respirator. Patient on vent with chest tube and surgical dressings in subc region. IMPRESSIONS  1. Left ventricular ejection fraction, by estimation, is 30 to 35%. The left ventricle has moderately decreased function. The left ventricle demonstrates regional wall motion abnormalities (see scoring diagram/findings for description).  2. The mitral valve is degenerative. Moderate mitral stenosis. The mean mitral valve gradient is 6.0 mmHg. Severe mitral annular calcification.  3. The aortic valve was not well visualized.  4. Tricuspid regurgitation signal is inadequate for assessing PA pressure.  5. The inferior vena cava is dilated in size with <50% respiratory variability, suggesting right atrial pressure of 15 mmHg. FINDINGS  Left Ventricle: Left ventricular ejection fraction, by estimation, is 30 to 35%. The left ventricle has moderately decreased function. The left ventricle demonstrates regional wall motion abnormalities. Definity  contrast agent was given IV to delineate the left ventricular endocardial borders. Abnormal (paradoxical) septal motion, consistent with left bundle branch block.  LV Wall Scoring: The mid and distal anterior septum, entire apex, and mid inferoseptal segment are akinetic. The anterior wall, antero-lateral wall, inferior wall, posterior wall, basal anteroseptal segment, and  basal inferoseptal segment are normal. Right Ventricle: Tricuspid regurgitation signal is inadequate for assessing PA pressure. Mitral Valve: The mitral valve is degenerative in appearance. There is moderate thickening of the mitral valve leaflet(s). There  is moderate calcification of the mitral valve leaflet(s). Severe mitral annular calcification. Moderate mitral valve stenosis. MV peak gradient, 11.4 mmHg. The mean mitral valve gradient is 6.0 mmHg. Aortic Valve: The aortic valve was not well visualized. Venous: The inferior vena cava is dilated in size with less than 50% respiratory variability, suggesting right atrial pressure of 15 mmHg. Additional Comments: Spectral Doppler performed. Color Doppler performed.  LEFT VENTRICLE PLAX 2D LVIDd:         4.00 cm LVIDs:         2.80 cm LV PW:         1.30 cm LV IVS:        1.00 cm  LV Volumes (MOD) LV vol d, MOD A2C: 77.3 ml LV vol d, MOD A4C: 105.0 ml LV vol s, MOD A2C: 49.9 ml LV vol s, MOD A4C: 57.8 ml LV SV MOD A2C:     27.4 ml LV SV MOD A4C:     105.0 ml LV SV MOD BP:      34.8 ml IVC IVC diam: 2.60 cm AORTIC VALVE LVOT Vmax:   70.40 cm/s LVOT Vmean:  46.900 cm/s LVOT VTI:    0.116 m MITRAL VALVE MV Area (PHT): 4.21 cm     SHUNTS MV Peak grad:  11.4 mmHg    Systemic VTI: 0.12 m MV Mean grad:  6.0 mmHg MV Vmax:       1.69 m/s MV Vmean:      119.0 cm/s MV Decel Time: 180 msec MV E velocity: 128.00 cm/s MV A velocity: 152.00 cm/s MV E/A ratio:  0.84 Wilbert Bihari MD Electronically signed by Wilbert Bihari MD Signature Date/Time: 03/20/2024/5:28:16 PM    Final    DG CHEST PORT 1 VIEW Result Date: 03/20/2024 CLINICAL DATA:  Intubated EXAM: PORTABLE CHEST 1 VIEW COMPARISON:  03/20/2024 FINDINGS: A single frontal view of the chest demonstrates endotracheal tube overlying tracheal air column, tip 3.5 cm above carina. The enteric catheter passes below diaphragm, tip excluded by collimation. Right internal jugular Cordis tip overlies superior vena cava. Stable mediastinal  drain and left chest tube. Postsurgical changes from median sternotomy, bypass, and thoracic aortic dissection repair. Stable cardiac silhouette. Persistent right pleural effusion. Stable bibasilar consolidation, right greater than left. There is a new small right apical pneumothorax, volume estimated less than 5%. Pleural separation measures 1.7 cm. There is a stable left apical pneumothorax, pleural separation measuring 1.3 cm, also estimated less than 5%. IMPRESSION: 1. New right apical pneumothorax, volume estimated less than 5%. No tension effect. 2. Stable small left apical pneumothorax, volume estimated less than 5%. No tension effect. 3. Stable right pleural effusion and bibasilar consolidation. 4. Support devices as above. These results were called by telephone at the time of interpretation on 03/20/2024 at 5:19 pm to the patient's nurse, [place], who verbally acknowledged these results. Electronically Signed   By: Ozell Daring M.D.   On: 03/20/2024 17:24   MR BRAIN WO CONTRAST Result Date: 03/20/2024 CLINICAL DATA:  Neuro deficit, acute stroke suspected EXAM: MRI HEAD WITHOUT CONTRAST TECHNIQUE: Multiplanar, multiecho pulse sequences of the brain and surrounding structures were obtained without intravenous contrast. COMPARISON:  July 06, 2023 FINDINGS: MRI brain: There are 3 small foci of restricted diffusion in the right parietal lobe. There is a punctate focus of restricted diffusion in the right frontal white matter. There is a small left posterior frontal cortical infarct. There is a punctate area of restricted diffusion in the left occipital lobe. There  are multiple small foci of magnetic susceptibility in both hemispheres, left side of the thalamus and the cerebellum. The left thalamic lesion is new and these abnormalities are more numerous than on the prior study. The ventricles are normal. No mass lesion. There are normal flow signals in the carotid arteries and basilar artery. No significant  bone marrow signal abnormality. No significant abnormality in the paranasal sinuses or soft tissues. IMPRESSION: 1. There are multiple small infarcts involving different vascular territories and both hemispheres suggesting a cardiac/systemic source of embolism 2. There are multiple small foci of chronic microhemorrhage. These are more numerous than on the prior MRI from July 06, 2023 Electronically Signed   By: Nancyann Burns M.D.   On: 03/20/2024 11:45   DG Chest Port 1 View Result Date: 03/20/2024 CLINICAL DATA:  Fall [id number].  Status post CABG. EXAM: PORTABLE CHEST 1 VIEW COMPARISON:  Portable chest yesterday at 6:43 p.m. FINDINGS: 5:24 a.m. ETT tip is mid tracheal 5.4 cm from the carina. Right IJ catheter introducer sheath terminates in the upper SVC. NGT enters the stomach with the side hole in the distal esophagus and should be advanced further in 8-10 cm. A mediastinal drain remains in place as does a left chest tube. There is a tangle of overlying monitor wiring. There is a small unchanged left apical pneumothorax with 1.5 cm pleural-parenchymal separation. Estimated pneumothorax volume is 5% or less. There is increased right pleural effusion today, stable small left effusion. There is patchy atelectasis or consolidation in both lower lung fields, stable on the left and increased on the right. The mid and upper lungs are clear. The cardiomediastinal silhouette and vasculature are stable. No new osseous abnormality. IMPRESSION: 1. Small unchanged left apical pneumothorax with 1.5 cm pleural-parenchymal separation. 2. Increased right pleural effusion today, stable small left effusion. 3. Patchy atelectasis or consolidation in both lower lung fields, stable on the left and increased on the right. 4. NGT side hole in the distal esophagus and should be advanced further in 8-10 cm. Electronically Signed   By: Francis Quam M.D.   On: 03/20/2024 07:23   DG CHEST PORT 1 VIEW Result Date: 03/19/2024 CLINICAL  DATA:  Hypoxia EXAM: PORTABLE CHEST 1 VIEW COMPARISON:  Same-day radiograph FINDINGS: Stable cardiomediastinal silhouette The Swan-Ganz catheter has been removed. Right IJ cast catheter sheath in the mid SVC. Subdiaphragmatic enteric tube. Endotracheal tube tip in the intrathoracic trachea 4.7 cm from the carina. Sternotomy. Mediastinal drain. Scarring in the right mid lung. Bibasilar atelectasis or infiltrates. No large pleural effusion. Decreased size of the small left apical pneumothorax. IMPRESSION: 1. Decreased size of the small left apical pneumothorax. 2. Bibasilar atelectasis or infiltrates. 3. Support apparatus as described. Electronically Signed   By: Norman Gatlin M.D.   On: 03/19/2024 19:22   Overnight EEG with video Result Date: 03/19/2024 Maurine Jurist, MD     03/19/2024 10:22 AM Continuous Video-EEG Monitoring Report CPT/Type of Study: 95720 (EEG with video, 12-26 hours)      Indications for Procedure: Altered mental status and seizure-like activity Primary neurological diagnosis: G93.40; R56.9 Duration of study: 03/18/2024 11:05 to 03/19/2024 07:30 History: This is a 68 year old male presenting with altered mental status and seizure-like activity. Continuous video-EEG monitoring was performed to evaluate for seizure. Technical Description: The EEG was performed using standard setting per the guidelines of American Clinical Neurophys[employer] Society (ACNS). A minimum of 21 electrodes were placed on scalp according to the International 10-20 or/and 10-10 Systems. Supplemental electrodes were placed  as needed. Single EKG electrode was also used to detect cardiac arrhythmia. Patient's behavior was continuously recorded on video simultaneously with EEG. A minimum of 16 channels were used for data display. Each epoch of study was reviewed manually daily and as needed using standard referential and bipolar montages. Computerized quantitative EEG analysis (such as compressed spectral array analysis, trending,  automated spike & seizure detection) were used as indicated. EEG Description: Epoch: 03/18/2024 11:05 to 03/19/2024 07:30 Background: There was generalized polymorphic delta slowing. No posterior dominant rhythm or sleep architecture was recorded. PERIODIC OR RHYTHMIC PATTERNS: None. EPILEPTIFORM DISCHARGES: None interictally. SEIZURES: During the initial hours of the epoch, 2 episodes of prolonged (>20 minutes) generalized myoclonic seizures consistent with /status epilepticus were captured around 13:40 and 15:05 on 03/18/2024, characterized electrographically by rhythmic generalized spike-and-waves (most prominent in midline and bilateral parasagittal regions). Clinically, intermittent myoclonic movement of body and bilateral limbs were observed on video. EVENTS: As above. EKG: No significant arrhythmia. 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   DG CHEST PORT 1 VIEW Result Date: 03/19/2024 CLINICAL DATA:  Hypoxia.  Status post CABG. EXAM: PORTABLE CHEST 1 VIEW COMPARISON:  03/18/2024 FINDINGS: Endotracheal tube tip is approximately 4.5 cm above the base of the carina. NG tube tip is positioned in the stomach. Right IJ pulmonary artery catheter tip is in the pulmonary outflow tract. Mediastinal/pericardial drain overlies the midline. Interval improvement in aeration at the right base with persistent streaky basilar airspace disease suggesting atelectasis. Left chest tube again noted with slight increase in small apical left-sided pneumothorax. Telemetry leads overlie the chest. IMPRESSION: 1. Slight increase in small apical left-sided pneumothorax. 2. Interval improvement in aeration at the right base with persistent streaky basilar airspace disease  suggesting atelectasis. Electronically Signed   By: Camellia Candle M.D.   On: 03/19/2024 07:24   CT ANGIO HEAD NECK W WO CM Result Date: 03/18/2024 EXAM: CTA HEAD AND NECK WITH AND WITHOUT 03/18/2024 09:40:10 AM TECHNIQUE: CTA of the head and neck was performed with and without the administration of intravenous contrast. Multiplanar 2D and/or 3D reformatted images are provided for review. Automated exposure control, iterative reconstruction, and/or weight based adjustment of the mA/kV was utilized to reduce the radiation dose to as low as reasonably achievable. Stenosis of the internal carotid arteries measured using NASCET criteria. COMPARISON: CT of the head dated 03/18/2024. CLINICAL HISTORY: Neuro deficit, acute, stroke suspected. 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 The dissection appears to start near the takeoff of the brachiocephalic artery. Edema surrounds the proximal left subclavian artery, but there is no luminal stenosis. The origins of the brachiocephalic artery and left common carotid artery are widely patent. CERVICAL CAROTID ARTERIES: The common carotid arteries are normal in caliber bilaterally. There is mild calcific plaque within the carotid bulbs, but no associated stenosis. There is mild calcific plaque present posteriorly within the origin of the right internal carotid artery, but no luminal stenosis. CERVICAL VERTEBRAL ARTERIES: The vertebral arteries are widely patent throughout their respective courses. LUNGS AND MEDIASTINUM: There is dependent atelectasis present within the upper lobes bilaterally. There is a mild left pneumothorax. There are bilateral pleural effusions,  moderate on the right and mild on the left. An endotracheal tube and enteric tube are present. SOFT TISSUES: Postsurgical changes are present from recent surgery including sternotomy  wires. BONES: No acute abnormality. CTA HEAD: ANTERIOR CIRCULATION: The cranial and cavernous segments of the internal carotid arteries are normal in caliber. There is mild irregular stenosis of the M1 segments of the middle cerebral arteries bilaterally. POSTERIOR CIRCULATION: There is mild stenosis of the T1 segments of the posterior cerebral arteries bilaterally. OTHER: No dural venous sinus thrombosis on this non-dedicated study. IMPRESSION: 1. 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 The entire arch is not included on the study. The cardiothoracic service is aware. 2. Mild irregular stenosis of the M1 segments of the middle cerebral arteries bilaterally and mild stenosis of the T1 segments of the posterior cerebral arteries bilaterally. 3. Mild left pneumothorax and bilateral pleural effusions, moderate on the right and mild on the left. Electronically signed by: evalene coho 03/18/2024 10:55 AM EDT RP Workstation: [id number]   DG Chest Port 1 View Result Date: 03/18/2024 EXAM: 1 VIEW XRAY OF THE CHEST 03/18/2024 05:21:00 AM COMPARISON: AP radiograph of the chest dated 03/17/2024. CLINICAL HISTORY: S/P CABG x 3 FINDINGS: LUNGS AND PLEURA: There is diffuse hazy and streaky opacification again demonstrated within the right lung base, improved in the interim. There has also been interval improvement of streaky left basilar atelectasis/infiltrate. There is residual right-sided pleural effusion. A small left apical pneumothorax is unchanged in the interim. HEART AND MEDIASTINUM: No acute abnormality of the cardiac and mediastinal silhouettes. BONES AND SOFT TISSUES: No acute osseous abnormality. LINES AND TUBES: An endotracheal tube, nasogastric tube, right internal jugular Swan-Ganz catheter, and mediastinal and left chest drainage catheters are again demonstrated. IMPRESSION: 1. Interval improvement of  diffuse hazy and streaky opacification within the right lung base and streaky left basilar atelectasis/infiltrate. 2. Residual right-sided pleural effusion. 3. Small left apical pneumothorax, unchanged in the interim. Electronically signed by: evalene coho 03/18/2024 08:28 AM EDT RP Workstation: [id number]   CT HEAD WO CONTRAST ( ) Result Date: 03/18/2024 EXAM: CT HEAD WITHOUT 03/18/2024 08:12:08 AM TECHNIQUE: CT of the head was performed without the administration of intravenous contrast. Automated exposure control, iterative reconstruction, and/or weight based adjustment of the mA/kV was utilized to reduce the radiation dose to as low as reasonably achievable. COMPARISON: None available. CLINICAL HISTORY: Seizure, new-onset, no history of trauma. FINDINGS: BRAIN AND VENTRICLES: No acute intracranial hemorrhage. No mass effect or midline shift. No extra-axial fluid collection. Gray-white differentiation is maintained. No hydrocephalus. There is a moderate amount of generalized cerebral volume loss present. There is mild-to-moderate periventricular and deep cerebral white matter disease. ORBITS: No acute abnormality. SINUSES AND MASTOIDS: There is mild mucosal disease within the floors of the maxillary sinuses. SOFT TISSUES AND SKULL: No acute skull fracture. No acute soft tissue abnormality. IMPRESSION: 1. No acute intracranial abnormality. 2. Moderate amount of generalized cerebral volume loss. 3. Mild-to-moderate periventricular and deep cerebral white matter disease. 4. Mild mucosal disease within the floors of the maxillary sinuses. Electronically signed by: evalene coho 03/18/2024 08:23 AM EDT RP Workstation: [id number]   DG Chest Port 1 View Result Date: 03/17/2024 CLINICAL DATA:  Post CABG. EXAM: PORTABLE CHEST 1 VIEW COMPARISON:  03/16/2024 FINDINGS: Endotracheal tube has tip 4.9 cm above the carina. Right IJ Swan-Ganz catheter has tip over the main pulmonary artery segment. Mediastinal drain  present. Enteric tube has  tip over the stomach in the left upper quadrant. Left-sided chest tube in place. Lungs are adequately inflated with opacification over the right mid to lower lung likely effusion with atelectasis. Suggestion of a small amount of left pleural fluid with linear atelectasis left base. Small left apical pneumothorax. Remainder the exam is unremarkable. IMPRESSION: 1. Opacification over the right mid to lower lung likely effusion with atelectasis. Suggestion of a small amount of left pleural fluid with linear atelectasis left base. 2. Tubes and lines as described. Small left apical pneumothorax with left-sided chest tube in place. Electronically Signed   By: Alan Agreste M.D.   On: 03/17/2024 17:21   ECHO INTRAOPERATIVE TEE Result Date: 03/17/2024  *INTRAOPERATIVE TRANSESOPHAGEAL REPORT *  Patient Name:   Alan [name] Date of Exam: 03/17/2024 Medical Rec #:  [id number]        Height:       76.0 in Accession #:    [id number]       Weight:       174.2 lb Date of Birth:  [birth year]         BSA:          2.09 m Patient Age:    68 years         BP:           101/73 mmHg Patient Gender: M                HR:           65 bpm. Exam Location:  Anesthesiology Transesophogeal exam was perform intraoperatively during surgical procedure. Patient was closely monitored under general anesthesia during the entirety of examination. 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 Lucious, MD; post-bypass images obtained by K. Cornea, MD. Complications: No known complications during this procedure. POST-OP IMPRESSIONS _ Left Ventricle: has normal systolic function on inotropic support with epinephrine  and milrinone . _ Right Ventricle: The right ventricle appears unchanged from pre-bypass;  normal function. _ Aorta: Status post ascending aorta replacement. There is a dissection flap present, visualized in the descending thoracic aorta. Color flow primarily in the true lumen with small fenestrations noted. _ Aortic Valve: No stenosis present. There is trace regurgitation. _ Mitral Valve: The mitral valve appears unchanged from pre-bypass; Mild to moderate regurgitation, mild stenosis (mean gradient of at a heart rate of 84BPM) _ Tricuspid Valve: The tricuspid valve appears unchanged from pre-bypass. There is mild regurgitation. _ Pulmonic Valve: The pulmonic valve appears unchanged from pre-bypass. _ Interatrial Septum: The interatrial septum appears unchanged from pre-bypass. _ Pericardium: The pericardium appears unchanged from pre-bypass. PRE-OP FINDINGS  Left Ventricle: The left ventricle has mild-moderately reduced systolic function, with an ejection fraction of 40-45%. The cavity size was normal. Right Ventricle: The right ventricle has normal systolic function. The cavity was normal. There is no increase in right ventricular wall thickness. Catheter present in the right ventricle. Left Atrium: Left atrial size was not assessed. No left atrial/left atrial appendage thrombus was detected. Right Atrium: Right atrial size was not assessed. Interatrial Septum: No atrial level shunt detected by color flow Doppler. Pericardium: There is no evidence of pericardial effusion. There is no pleural effusion. Mitral Valve: The mitral valve has been repaired/replaced.  Mitral valve regurgitation is mild by color flow Doppler. There is mild mitral stenosis. Tricuspid Valve: The tricuspid valve was normal in structure. Tricuspid valve regurgitation is mild by color flow Doppler. Aortic Valve: The aortic valve is normal in structure. Aortic valve regurgitation is trivial by color flow Doppler. There is no stenosis of the aortic valve. Pulmonic Valve: The pulmonic valve was normal in structure No evidence of  pumonic stenosis. Pulmonic valve regurgitation is mild by color flow Doppler.  Lynwood Cornea Electronically signed by Lynwood Cornea Signature Date/Time: 03/17/2024/5:10:33 PM    Final    DG Chest 2 View Result Date: 03/16/2024 CLINICAL DATA:  Pre op examination. EXAM: CHEST - 2 VIEW COMPARISON:  Chest radiograph 03/08/2024 and chest CT 12/26/2023 and PET-CT 09/22/2022 FINDINGS: Again noted is volume loss in the right hemithorax with right perihilar scarring or fibrosis. Oval-shaped density at the left lung apex may be new measuring roughly 8 mm. Remainder of the lungs clear without new airspace disease or pulmonary edema. Again noted are sclerotic lesions involving the proximal left humerus and the T11 vertebral body. No large pleural effusions. IMPRESSION: 1. No acute cardiopulmonary disease. 2. Indeterminate oval shaped density at the left lung apex. Recommend attention on follow-up imaging or further evaluation with chest CT. 3. Chronic sclerotic bone lesions. Electronically Signed   By: Juliene Balder M.D.   On: 03/16/2024 12:32   VAS US  DOPPLER PRE CABG Result Date: 03/12/2024 PREOPERATIVE VASCULAR EVALUATION Patient Name:  Alan [name]  Date of Exam:   03/12/2024 Medical Rec #: [id number]         Accession #:    [id number] Date of Birth: March 08, 1956          Patient Gender: M Patient Age:   60 years Exam Location:  [facility] Procedure:      VAS US  DOPPLER PRE CABG Referring Phys: ELSPETH Yehia Mcbain --------------------------------------------------------------------------------  Indications: Pre-CABG. Limitations: Pt unable to stay awake during exam and anatomy moves out of field              with breathing Performing Technologist: Jimmye Scarce RVT  Examination Guidelines: A complete evaluation includes B-mode imaging, spectral Doppler, color Doppler, and power Doppler as needed of all accessible portions of each vessel. Bilateral testing is considered an integral part of a complete examination. Limited  examinations for reoccurring indications may be performed as noted.  Right Carotid Findings: +----------+-------+-------+--------+------------------------+-----------------+           PSV    EDV    StenosisDescribe                Comments                    cm/s   cm/s                                                     +----------+-------+-------+--------+------------------------+-----------------+ CCA Prox  77     14                                                       +----------+-------+-------+--------+------------------------+-----------------+ CCA Distal60     20  intimal                                                                   thickening        +----------+-------+-------+--------+------------------------+-----------------+ ICA Prox  78     31     1-39%   irregular and                                                             heterogenous                              +----------+-------+-------+--------+------------------------+-----------------+ ICA Mid   85     27                                                       +----------+-------+-------+--------+------------------------+-----------------+ ICA Distal74     28                                                       +----------+-------+-------+--------+------------------------+-----------------+ ECA       76                                                              +----------+-------+-------+--------+------------------------+-----------------+ +----------+--------+-------+----------------+------------+           PSV cm/sEDV cmsDescribe        Arm Pressure +----------+--------+-------+----------------+------------+ Subclavian103            Multiphasic, WNL             +----------+--------+-------+----------------+------------+ +---------+--------+--+--------+-+---------+ VertebralPSV cm/s40EDV cm/s9Antegrade  +---------+--------+--+--------+-+---------+ Left Carotid Findings: +----------+-------+-------+--------+------------------------+-----------------+           PSV    EDV    StenosisDescribe                Comments                    cm/s   cm/s                                                     +----------+-------+-------+--------+------------------------+-----------------+ CCA Prox  84     21                                                       +----------+-------+-------+--------+------------------------+-----------------+  CCA Distal58     18             irregular and                                                             heterogenous                              +----------+-------+-------+--------+------------------------+-----------------+ ICA Prox  91     34                                     intimal                                                                   thickening        +----------+-------+-------+--------+------------------------+-----------------+ ICA Mid   128    39                                                       +----------+-------+-------+--------+------------------------+-----------------+ ICA Distal92     34                                                       +----------+-------+-------+--------+------------------------+-----------------+ ECA       77                                                              +----------+-------+-------+--------+------------------------+-----------------+  +----------+--------+--------+----------------+------------+ SubclavianPSV cm/sEDV cm/sDescribe        Arm Pressure +----------+--------+--------+----------------+------------+           112             Multiphasic, WNL             +----------+--------+--------+----------------+------------+ +---------+--------+--+--------+--+---------+ VertebralPSV cm/s43EDV cm/s13Antegrade  +---------+--------+--+--------+--+---------+  ABI Findings: +------------------+-----+---------+ Rt Pressure (mmHg)IndexWaveform  +------------------+-----+---------+ 89                     triphasic +------------------+-----+---------+ 127               1.43 triphasic +------------------+-----+---------+ 106               1.19 triphasic +------------------+-----+---------+ +------------------+-----+---------+-------+ Lt Pressure (mmHg)IndexWaveform Comment +------------------+-----+---------+-------+                        triphasicIV      +------------------+-----+---------+-------+ 104  1.17 triphasic        +------------------+-----+---------+-------+ 119               1.34 triphasic        +------------------+-----+---------+-------+ +-------+---------------+ ABI/TBIToday's ABI/TBI +-------+---------------+ Right  1.43            +-------+---------------+ Left   1.17            +-------+---------------+  Right Doppler Findings: +--------+--------+---------+ Site    PressureDoppler   +--------+--------+---------+ [id number]      triphasic +--------+--------+---------+  Left Doppler Findings: +--------+---------+--------+ Site    Doppler  Comments +--------+---------+--------+ BrachialtriphasicIV       +--------+---------+--------+   Summary: Right Carotid: Velocities in the right ICA are consistent with a 1-39% stenosis. Left Carotid: There is no evidence of stenosis in the left ICA. Vertebrals:  Bilateral vertebral arteries demonstrate antegrade flow. Subclavians: Normal flow hemodynamics were seen in bilateral subclavian              arteries. Right ABI: Falsely elevated right ABI. Left ABI: Resting left ankle-brachial index is within normal range. Right Upper Extremity: Doppler waveform obliterate with right radial compression. Doppler waveforms remain within normal limits with right ulnar compression. Left Upper Extremity: Doppler  waveform obliterate with left radial compression. Doppler waveforms remain within normal limits with left ulnar compression.  Electronically signed by Debby Robertson on 03/12/2024 at 9:14:59 PM.    Final    ECHOCARDIOGRAM LIMITED Result Date: 03/11/2024    ECHOCARDIOGRAM LIMITED REPORT   Patient Name:   Alan [name] Date of Exam: 03/11/2024 Medical Rec #:  [id number]        Height:       76.0 in Accession #:    [id number]       Weight:       174.2 lb Date of Birth:  11/10/1955         BSA:          2.089 m Patient Age:    68 years         BP:           81/63 mmHg Patient Gender: M                HR:           80 bpm. Exam Location:  Inpatient Procedure: 2D Echo, Limited Echo and Intracardiac Opacification Agent (Both            Spectral and Color Flow Doppler were utilized during procedure). Indications:    Limited for function  History:        Patient has prior history of Echocardiogram examinations, most                 recent 03/10/2024. Acute MI and CAD; Risk Factors:Dyslipidemia                 and Hypertension.  Sonographer:    Therisa Crouch Referring Phys: [phone] LINDSAY B ROBERTS IMPRESSIONS  1. Left ventricular ejection fraction, by estimation, is 30 to 35%. The left ventricle has moderately decreased function. The left ventricle demonstrates regional wall motion abnormalities (see scoring diagram/findings for description).  2. Right ventricular systolic function is normal. The right ventricular size is normal. Comparison(s): Prior images reviewed side by side. No improvement from 7/28 study. FINDINGS  Left Ventricle: No LV Apical thrombus. Left ventricular ejection fraction, by estimation, is 30 to 35%. The left ventricle has moderately decreased function. The left ventricle demonstrates regional wall motion  abnormalities.  LV Wall Scoring: The mid and distal anterior septum, mid inferoseptal segment, apical anterior segment, apical inferior segment, and apex are akinetic. The anterior wall, mid and distal  lateral wall, inferior wall, posterior wall, basal anteroseptal segment, mid anterolateral segment, and basal inferoseptal segment are hypokinetic. Right Ventricle: The right ventricular size is normal. No increase in right ventricular wall thickness. Right ventricular systolic function is normal.  LV Volumes (MOD) LV vol d, MOD A2C: 98.6 ml LV vol d, MOD A4C: 106.0 ml LV vol s, MOD A2C: 69.1 ml LV vol s, MOD A4C: 67.9 ml LV SV MOD A2C:     29.5 ml LV SV MOD A4C:     106.0 ml LV SV MOD BP:      33.6 ml Stanly Leavens MD Electronically signed by Stanly Leavens MD Signature Date/Time: 03/11/2024/10:55:27 AM    Final    ECHOCARDIOGRAM COMPLETE Result Date: 03/11/2024    ECHOCARDIOGRAM REPORT   Patient Name:   Alan [name] Date of Exam: 03/10/2024 Medical Rec #:  [id number]        Height:       72.0 in Accession #:    [id number]       Weight:       174.2 lb Date of Birth:  1955-10-01         BSA:          2.009 m Patient Age:    68 years         BP:           122/93 mmHg Patient Gender: M                HR:           83 bpm. Exam Location:  Inpatient Procedure: 2D Echo, Cardiac Doppler, Color Doppler and Intracardiac            Opacification Agent (Both Spectral and Color Flow Doppler were            utilized during procedure). 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 Acute MI and CAD, Arrythmias:Tachycardia; Risk                 Factors:Hypertension and Dyslipidemia. MV repair Dusty                 04/08/2014-Complex Valvular Repair utilizing a 34 mm Sorin Memo                 3D Reochord Ring Annuloplasty                 -Quadrangular resection with sliding plasty of the posterior                 leaflet (p2)                 -Suspension of Neochords x 4                 -Chordal Transfer.                  Mitral Valve: 34 Sorin Memo 3D Reochord Ring prosthetic                 annuloplasty ring  valve is present in the mitral position.  Procedure Date: 04/08/2014.  Sonographer:    Thea Norlander RCS Referring Phys: MAUDE JAYSON EMMER  Sonographer Comments: Technically difficult study due to poor echo windows and no parasternal window. Attempted Definity  contrast. IMPRESSIONS  1. Left ventricular ejection fraction, by estimation, is 30 to 35%. The left ventricle has moderately decreased function. The left ventricle demonstrates regional wall motion abnormalities (see scoring diagram/findings for description). Left ventricular  diastolic parameters are indeterminate.  2. LV anteroseptal, mid anterior, and apical akinesis.  3. Right ventricular systolic function is mildly reduced. The right ventricular size is normal. Tricuspid regurgitation signal is inadequate for assessing PA pressure.  4. The mitral valve has been repaired/replaced. Trivial mitral valve regurgitation. The mean mitral valve gradient is 4.0 mmHg with average heart rate of 83 bpm. There is a 34 Sorin Memo 3D [place]ord Ring prosthetic annuloplasty ring present in the mitral position. Procedure Date: 04/08/2014. Echo findings are consistent with normal structure and function of the mitral valve prosthesis.  5. The aortic valve was not well visualized. Aortic valve regurgitation is trivial.  6. The inferior vena cava is normal in size with greater than 50% respiratory variability, suggesting right atrial pressure of 3 mmHg. FINDINGS  Left Ventricle: Left ventricular ejection fraction, by estimation, is 30 to 35%. The left ventricle has moderately decreased function. The left ventricle demonstrates regional wall motion abnormalities. Definity  contrast agent was given IV to delineate the left ventricular endocardial borders. The left ventricular internal cavity size was normal in size. Suboptimal image quality limits for assessment of left ventricular hypertrophy. Left ventricular diastolic parameters are indeterminate. Right Ventricle:  The right ventricular size is normal. No increase in right ventricular wall thickness. Right ventricular systolic function is mildly reduced. Tricuspid regurgitation signal is inadequate for assessing PA pressure. Left Atrium: Left atrial size was normal in size. Right Atrium: Right atrial size was normal in size. Pericardium: There is no evidence of pericardial effusion. Mitral Valve: The mitral valve has been repaired/replaced. Trivial mitral valve regurgitation. There is a 34 Sorin Memo 3D Re[place]ord Ring prosthetic annuloplasty ring present in the mitral position. Procedure Date: 04/08/2014. Echo findings are consistent with normal structure and function of the mitral valve prosthesis. MV peak gradient, 9.7 mmHg. The mean mitral valve gradient is 4.0 mmHg with average heart rate of 83 bpm. Tricuspid Valve: The tricuspid valve is grossly normal. Tricuspid valve regurgitation is not demonstrated. No evidence of tricuspid stenosis. Aortic Valve: The aortic valve was not well visualized. Aortic valve regurgitation is trivial. Aortic valve peak gradient measures 2.0 mmHg. Pulmonic Valve: The pulmonic valve was not well visualized. Pulmonic valve regurgitation is not visualized. Aorta: The aortic root was not well visualized. Venous: The inferior vena cava is normal in size with greater than 50% respiratory variability, suggesting right atrial pressure of 3 mmHg. IAS/Shunts: The atrial septum is grossly normal.   Diastology LV e' medial:    5.87 cm/s LV E/e' medial:  13.9 LV e' lateral:   6.31 cm/s LV E/e' lateral: 12.9  RIGHT VENTRICLE             IVC RV S prime:     10.00 cm/s  IVC diam: 1.70 cm TAPSE (M-mode): 1.4 cm LEFT ATRIUM             Index        RIGHT ATRIUM           Index LA Vol (A2C):   23.3 ml 11.60 ml/m  RA Area:  10.20 cm LA Vol (A4C):   18.8 ml 9.36 ml/m   RA Volume:   16.70 ml  8.31 ml/m LA Biplane Vol: 21.6 ml 10.75 ml/m  AORTIC VALVE AV Vmax:      71.50 cm/s AV Peak Grad: 2.0 mmHg LVOT Vmax:     62.20 cm/s LVOT Vmean:   39.900 cm/s LVOT VTI:     0.099 m MITRAL VALVE MV Area (PHT): 3.27 cm     SHUNTS MV Peak grad:  9.7 mmHg     Systemic VTI: 0.10 m MV Mean grad:  4.0 mmHg MV Vmax:       1.56 m/s MV Vmean:      92.7 cm/s MV Decel Time: 232 msec MV E velocity: 81.40 cm/s MV A velocity: 146.00 cm/s MV E/A ratio:  0.56 Soyla Merck MD Electronically signed by Soyla Merck MD Signature Date/Time: 03/11/2024/12:03:58 AM    Final    CARDIAC CATHETERIZATION Result Date: 03/10/2024 Images from the original result were not included.   Previously placed Ost LAD to Prox LAD stent is 99% in-stent restenosis (with TIMI I-0 flow distally).   Scoring balloon angioplasty was performed using a BALLOON SCOREFLEX 3.50X15.  Post intervention, there is a 60% residual stenosis.  TIMI-3 flow restored   ------------------------------------------   Mid LAD to Dist LAD lesion is 80% stenosed.  TIMI 0-I flow.   Balloon angioplasty was performed using a BALLOON TAKERU 2.0X12. Post intervention, there is a 50% residual stenosis.  TIMI-3 flow distally restored distally   ------------------------------------------   Prox RCA-1 lesion is 45% stenosed. Prox RCA-2 lesion is 70% stenosed.   Ost Cx lesion is 40% stenosed.   ------------------------------------------   LV end diastolic pressure is normal.   There is no aortic valve stenosis. Diagnostic Dominance: Right      Intervention Multivessel disease: Likely culprit is 99% eccentric fibrotic/calcified ISR of ostial-proximal LAD stent from 2004-2005 with TIMI 0 flow to the apex, also noted focal mid LAD 80% stenosis just prior to small diagonal branch Difficult PTCA of ISR starting with a 2.0 mm balloon upsizing to 2.5 mm flex followed by 3.5 mm flex inflated to high atmospheres reducing the stenosis to ~50 to 60%, and PTCA only of the mid LAD 80% stenosis using a 2.0 mm balloon reducing to 50% stenosis. Questionable ostial LCx-in some images appears to be widely patent another  images appear to be at least 60 to 70% stenosed.  (Will need IVUS or FFR wire assessment).  The main to the LCx is free of any disease has a small OM1 branch and then terminates as a LPL 1. RCA with apparent ulcerated plaque at a small proximal branch followed by a focal concentric heavily calcified 70% stenosis.  The remainder of the RCA has mild diffuse disease and terminates as a large near wraparound PDA with 2 major PL branches and 1 minor branch.  Mild diffuse disease. Normal LVEDP RECOMMENDATIONS   Anticipated discharge date to be determined.   Plan is to review case with interventional colleagues to determine best course of action: Based on the fact that the LAD ISR PTCA was suboptimal and will likely require potentially shockwave lithotripsy and high-pressure balloon angioplasty with likely new stent placement that would be ostial, question if there is a benefit for pausing with PTCA only and considering CVTS consultation.   He was loaded with prasugrel  in the Cath Lab, however in order to minimize the wait time if CABG is considered, we will convert to Plavix   for now pending decision about CVTS consultation or proceeding with PCI [name], MD  DG Chest 2 View Result Date: 03/08/2024 CLINICAL DATA:  cp EXAM: CHEST - 2 VIEW COMPARISON:  07/01/2019 FINDINGS: Pulmonary hyperinflation. Right perihilar fibrosis. No new infiltrate or nodule. Heart size and mediastinal contours are within normal limits. Post valve surgery. No effusion. 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 IMPRESSION: 1. No acute cardiopulmonary disease. 2. Right perihilar fibrosis. Electronically Signed   By: JONETTA Faes M.D.   On: 03/08/2024 16:05    Results for orders placed or performed during the hospital encounter of 03/10/24 (from the past 48 hours)  Glucose, capillary     Status: Abnormal   Collection Time: 03/30/24 12:18 PM  Result Value Ref Range   Glucose-Capillary 115 (H) 70 - 99 mg/dL    Comment: Glucose  reference range applies only to samples taken after fasting for at least 8 hours.  Glucose, capillary     Status: Abnormal   Collection Time: 03/30/24  3:26 PM  Result Value Ref Range   Glucose-Capillary 135 (H) 70 - 99 mg/dL    Comment: Glucose reference range applies only to samples taken after fasting for at least 8 hours.  Glucose, capillary     Status: Abnormal   Collection Time: 03/30/24  7:35 PM  Result Value Ref Range   Glucose-Capillary 122 (H) 70 - 99 mg/dL    Comment: Glucose reference range applies only to samples taken after fasting for at least 8 hours.  Glucose, capillary     Status: Abnormal   Collection Time: 03/30/24 11:27 PM  Result Value Ref Range   Glucose-Capillary 119 (H) 70 - 99 mg/dL    Comment: Glucose reference range applies only to samples taken after fasting for at least 8 hours.  Glucose, capillary     Status: Abnormal   Collection Time: 03/31/24  3:24 AM  Result Value Ref Range   Glucose-Capillary 102 (H) 70 - 99 mg/dL    Comment: Glucose reference range applies only to samples taken after fasting for at least 8 hours.  Cooxemetry Panel (carboxy, met, total hgb, O2 sat)     Status: Abnormal   Collection Time: 03/31/24  4:54 AM  Result Value Ref Range   Total hemoglobin 8.5 (L) 12.0 - 16.0 g/dL   O2 Saturation 27.6 %   Carboxyhemoglobin 2.1 (H) 0.5 - 1.5 %   Methemoglobin <0.7 0.0 - 1.5 %    Comment: Performed at [facility], [address], [place], [place] [address]  CBC     Status: Abnormal   Collection Time: 03/31/24  4:54 AM  Result Value Ref Range   WBC 11.9 (H) 4.0 - 10.5 K/uL   RBC 2.61 (L) 4.22 - 5.81 MIL/uL   Hemoglobin 7.9 (L) 13.0 - 17.0 g/dL   HCT 73.1 (L) 60.9 - 47.9 %   MCV 102.7 (H) 80.0 - 100.0 fL   MCH 30.3 26.0 - 34.0 pg   MCHC 29.5 (L) 30.0 - 36.0 g/dL   RDW 82.9 (H) 88.4 - 84.4 %   Platelets 199 150 - 400 K/uL   nRBC 0.3 (H) 0.0 - 0.2 %    Comment: Performed at [facility], [address], [place], [place]  [address]  Basic metabolic panel with GFR     Status: Abnormal   Collection Time: 03/31/24  4:54 AM  Result Value Ref Range   Sodium 150 (H) 135 - 145 mmol/L  Potassium 3.7 3.5 - 5.1 mmol/L   Chloride 113 (H) 98 - 111 mmol/L   CO2 29 22 - 32 mmol/L   Glucose, Bld 144 (H) 70 - 99 mg/dL    Comment: Glucose reference range applies only to samples taken after fasting for at least 8 hours.   BUN 60 (H) 8 - 23 mg/dL   Creatinine, Ser 9.17 0.61 - 1.24 mg/dL   Calcium  8.0 (L) 8.9 - 10.3 mg/dL   GFR, Estimated >39 >39 mL/min    Comment: (NOTE) Calculated using the CKD-EPI Creatinine Equation (2021)    Anion gap 8 5 - 15    Comment: Performed at [facility], [address] [place], [place] [address]  Glucose, capillary     Status: Abnormal   Collection Time: 03/31/24  7:50 AM  Result Value Ref Range   Glucose-Capillary 124 (H) 70 - 99 mg/dL    Comment: Glucose reference range applies only to samples taken after fasting for at least 8 hours.  Glucose, capillary     Status: Abnormal   Collection Time: 03/31/24 11:38 AM  Result Value Ref Range   Glucose-Capillary 152 (H) 70 - 99 mg/dL    Comment: Glucose reference range applies only to samples taken after fasting for at least 8 hours.  Glucose, capillary     Status: Abnormal   Collection Time: 03/31/24  3:38 PM  Result Value Ref Range   Glucose-Capillary 130 (H) 70 - 99 mg/dL    Comment: Glucose reference range applies only to samples taken after fasting for at least 8 hours.  Glucose, capillary     Status: Abnormal   Collection Time: 03/31/24  7:45 PM  Result Value Ref Range   Glucose-Capillary 104 (H) 70 - 99 mg/dL    Comment: Glucose reference range applies only to samples taken after fasting for at least 8 hours.  Glucose, capillary     Status: Abnormal   Collection Time: 03/31/24 11:07 PM  Result Value Ref Range   Glucose-Capillary 154 (H) 70 - 99 mg/dL    Comment: Glucose reference range applies only to samples taken after  fasting for at least 8 hours.  Glucose, capillary     Status: Abnormal   Collection Time: 04/01/24  3:44 AM  Result Value Ref Range   Glucose-Capillary 123 (H) 70 - 99 mg/dL    Comment: Glucose reference range applies only to samples taken after fasting for at least 8 hours.  Cooxemetry Panel (carboxy, met, total hgb, O2 sat)     Status: Abnormal   Collection Time: 04/01/24  5:03 AM  Result Value Ref Range   Total hemoglobin 8.5 (L) 12.0 - 16.0 g/dL   O2 Saturation 35.5 %   Carboxyhemoglobin 2.2 (H) 0.5 - 1.5 %   Methemoglobin <0.7 0.0 - 1.5 %    Comment: Performed at [facility], [address] [place], [place] [address]  Basic metabolic panel with GFR     Status: Abnormal   Collection Time: 04/01/24  5:03 AM  Result Value Ref Range   Sodium 151 (H) 135 - 145 mmol/L   Potassium 3.7 3.5 - 5.1 mmol/L   Chloride 115 (H) 98 - 111 mmol/L   CO2 27 22 - 32 mmol/L   Glucose, Bld 139 (H) 70 - 99 mg/dL    Comment: Glucose reference range applies only to samples taken after fasting for at least 8 hours.   BUN 61 (H) 8 - 23 mg/dL  Creatinine, Ser 0.78 0.61 - 1.24 mg/dL   Calcium  8.2 (L) 8.9 - 10.3 mg/dL   GFR, Estimated >39 >39 mL/min    Comment: (NOTE) Calculated using the CKD-EPI Creatinine Equation (2021)    Anion gap 9 5 - 15    Comment: Performed at [facility], [address] [place], [place] [address]  CBC     Status: Abnormal   Collection Time: 04/01/24  5:03 AM  Result Value Ref Range   WBC 10.1 4.0 - 10.5 K/uL   RBC 2.42 (L) 4.22 - 5.81 MIL/uL   Hemoglobin 7.4 (L) 13.0 - 17.0 g/dL   HCT 75.4 (L) 60.9 - 47.9 %   MCV 101.2 (H) 80.0 - 100.0 fL   MCH 30.6 26.0 - 34.0 pg   MCHC 30.2 30.0 - 36.0 g/dL   RDW 82.8 (H) 88.4 - 84.4 %   Platelets 183 150 - 400 K/uL   nRBC 0.4 (H) 0.0 - 0.2 %    Comment: Performed at [facility], [address] [place], [place] [address]  Glucose, capillary     Status: Abnormal   Collection Time: 04/01/24  8:14 AM  Result  Value Ref Range   Glucose-Capillary 127 (H) 70 - 99 mg/dL    Comment: Glucose reference range applies only to samples taken after fasting for at least 8 hours.      Treatments: surgery:   Operative Report    DATE OF PROCEDURE: 03/17/2024   PREOPERATIVE DIAGNOSIS:  Three-vessel coronary artery disease.   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   PROCEDURES PERFORMED:  Median sternotomy, extracorporeal circulation,  Coronary artery bypass grafting x 3 (left internal mammary artery to LAD, saphenous vein graft to obtuse marginal, saphenous vein graft to posterior descending).  Endoscopic vein harvest right leg.   Hemiarch repair of ascending aortic dissection with resuspension of aortic valve under moderate hypothermic circulatory arrest using 30 mm HemoShield Platinum graft.   SURGEON:  Elspeth BROCKS. Kerrin, MD.   FIRST ASSISTANT:  Linnie Rayas, MD.   SECOND ASSISTANT:  Lemond Cera, [place].    Additional procedures: Placement of right chest tube on 03/29/2024 Bronchoscopy on 03/27/2024 Bronchoscopic guided percutaneous tracheostomy 03/27/2024 EEG 03/26/2024 Right chest tube placement 03/25/2024 Flexible bronchoscopy with bronchial alveolar lavage on 03/24/2024 Core track placement 03/21/2024 EEG 03/20/2024  Discharge Exam: Blood pressure (!) 120/54, pulse (!) 101, temperature 98.6 F (37 C), temperature source Oral, resp. rate (!) 27, height 6' 4 (1.93 m), weight 72.9 kg, SpO2 99%.  Critically ill appearing man lying in bed in NAD [place]/AT, eyes anicteric Trach in place without erythema S1S2, RRR Abd soft, NT Cyanotic mottled toes bilaterally, L>R, similar today. No LE extremities.  Opens eyes to verbal stimulation, not following commands.     Discharge Medications:  The patient has been discharged on:   1.Beta Blocker:  Yes [   ]                              No   [   ]                              If No,  reason:  2.Ace Inhibitor/ARB: Yes [   ]  No  [    ]                                     If No, reason:  3.Statin:   Yes [   ]                  No  [   ]                  If No, reason:  4.Ecasa:  Yes  [   ]                  No   [   ]                  If No, reason:  Patient had ACS upon admission:  Plavix /P2Y12 inhibitor: Yes [   ]                                      No  [   ]     Discharge Instructions     AMB Referral to Cardiac Rehabilitation - Phase II   Complete by: As directed    Diagnosis:  NSTEMI PTCA     After initial evaluation and assessments completed: Virtual Based Care may be provided alone or in conjunction with Phase 2 Cardiac Rehab based on patient barriers.: Yes   Intensive Cardiac Rehabilitation (ICR) [facility] location only OR Traditional Cardiac Rehabilitation (TCR) *If criteria for ICR are not met will enroll in TCR ([facility] only): Yes   AMB Referral to Cardiac Rehabilitation - Phase II   Complete by: As directed    Diagnosis: CABG   CABG X ___: 3   After initial evaluation and assessments completed: Virtual Based Care may be provided alone or in conjunction with Phase 2 Cardiac Rehab based on patient barriers.: Yes   Intensive Cardiac Rehabilitation (ICR) [facility] location only OR Traditional Cardiac Rehabilitation (TCR) *If criteria for ICR are not met will enroll in TCR ([facility] only): Yes      Allergies as of 04/01/2024       Reactions   A-cillin [ampicillin] Shortness Of Breath, Swelling, Rash   Did it involve swelling of the face/tongue/throat, SOB, or low BP? Yes Did it involve sudden or severe rash/hives, skin peeling, or any reaction on the inside of your mouth or nose? Yes Did you need to seek medical attention at a hospital or doctor's office? Yes When did it last happen? ~20 years ago If all above answers are NO, may proceed with cephalosporin use.   Amoxicillin Hives, Shortness Of Breath, Swelling   Did it involve  swelling of the face/tongue/throat, SOB, or low BP? Yes Did it involve sudden or severe rash/hives, skin peeling, or any reaction on the inside of your mouth or nose? Yes Did you need to seek medical attention at a hospital or doctor's office? Yes When did it last happen? ~20 years ago If all above answers are NO, may proceed with cephalosporin use.   Other Hives, Shortness Of Breath, Swelling   ALL CILLINS   Penicillins Hives, Shortness Of Breath, Swelling   Did it involve swelling of the face/tongue/throat, SOB, or low BP? Yes Did it involve sudden or severe rash/hives, skin peeling, or any reaction on the inside of  your mouth or nose? Yes Did you need to seek medical attention at a hospital or doctor's office? Yes When did it last happen? ~20 years ago If all above answers are NO, may proceed with cephalosporin use.   Sulfa Antibiotics Nausea Only   Crestor  [rosuvastatin ] Other (See Comments)   G I upset        Medication List     STOP taking these medications    Co Q 10 100 MG Caps   cyanocobalamin 1000 MCG tablet Commonly known as: VITAMIN B12   D3 + K2 DOTS PO   diazepam  2 MG tablet Commonly known as: Valium    latanoprost  0.005 % ophthalmic solution Commonly known as: XALATAN    magnesium  30 MG tablet   Melatonin 10 MG Tabs   NALTREXONE HCL (PAIN) PO   OVER THE COUNTER MEDICATION   PROSTATE PO   sucralfate  1 g tablet Commonly known as: Carafate    traMADol  50 MG tablet Commonly known as: ULTRAM    vitamin C  1000 MG tablet       TAKE these medications    acetaminophen  160 MG/5ML solution Commonly known as: TYLENOL  Place 20.3 mLs (650 mg total) into feeding tube every 6 (six) hours.   acetaminophen  325 MG tablet Commonly known as: TYLENOL  Place 2 tablets (650 mg total) into feeding tube every 6 (six) hours.   amiodarone  200 MG tablet Commonly known as: PACERONE  Place 1 tablet (200 mg total) into feeding tube daily. Start taking on: April 02, 2024   aspirin  EC 325 MG tablet Take 1 tablet (325 mg total) by mouth daily. Start taking on: April 02, 2024   aspirin  81 MG chewable tablet Place 4 tablets (324 mg total) into feeding tube daily. Start taking on: April 02, 2024   atorvastatin  80 MG tablet Commonly known as: LIPITOR Place 1 tablet (80 mg total) into feeding tube daily. Start taking on: April 02, 2024 What changed:  medication strength how much to take how to take this   bisacodyl  5 MG EC tablet Commonly known as: DULCOLAX Take 2 tablets (10 mg total) by mouth daily as needed for moderate constipation.   bisacodyl  10 MG suppository Commonly known as: DULCOLAX Place 1 suppository (10 mg total) rectally daily as needed for moderate constipation.   Chlorhexidine  Gluconate Cloth 2 % Pads Apply 6 each topically daily.   famotidine  20 MG tablet Commonly known as: PEPCID  Place 1 tablet (20 mg total) into feeding tube 2 (two) times daily. What changed: how to take this   feeding supplement (PROSource TF20) liquid Place 60 mLs into feeding tube 3 (three) times daily.   feeding supplement (VITAL 1.5 CAL) Liqd Place 1,000 mLs into feeding tube continuous.   fentaNYL  50 MCG/ML injection Commonly known as: SUBLIMAZE  Inject 0.5-1 mLs (25-50 mcg total) into the vein every hour as needed for severe pain (pain score 7-10) (CPOT > 4).   fiber supplement (BANATROL TF) liquid Place 60 mLs into feeding tube 4 (four) times daily.   free water  Soln Place 300 mLs into feeding tube every 2 (two) hours.   Gerhardt's butt cream Crea Apply 1 Application topically daily as needed for irritation.   heparin  5000 UNIT/ML injection Inject 1 mL (5,000 Units total) into the skin every 8 (eight) hours.   hydrALAZINE  20 MG/ML injection Commonly known as: APRESOLINE  Inject 0.5-1 mLs (10-20 mg total) into the vein every 2 (two) hours as needed (for SBP >150).   hydrALAZINE  10 MG tablet Commonly known as:  APRESOLINE  Place 1 tablet (10 mg total) into feeding tube every 8 (eight) hours.   insulin  aspart 100 UNIT/ML injection Commonly known as: novoLOG  Inject 0-24 Units into the skin every 4 (four) hours.   insulin  glargine 100 UNIT/ML injection Commonly known as: LANTUS  Inject 0.05 mLs (5 Units total) into the skin daily. Start taking on: April 02, 2024   ipratropium-albuterol  0.5-2.5 (3) MG/3ML Soln Commonly known as: DUONEB Take 3 mLs by nebulization every 4 (four) hours as needed.   levETIRAcetam  500 MG/5ML undiluted injection Commonly known as: KEPPRA  Inject 10 mLs (1,000 mg total) into the vein every 12 (twelve) hours.   linezolid  600 MG/300ML IVPB Commonly known as: ZYVOX  Inject 300 mLs (600 mg total) into the vein every 12 (twelve) hours.   loperamide  HCl 1 MG/7.5ML suspension Commonly known as: IMODIUM  Place 15 mLs (2 mg total) into feeding tube as needed for diarrhea or loose stools.   mouth rinse Liqd solution 15 mLs by Mouth Rinse route every 2 (two) hours.   mouth rinse Liqd solution 15 mLs by Mouth Rinse route as needed (oral care).   ondansetron  4 MG/2ML Soln injection Commonly known as: ZOFRAN  Inject 2 mLs (4 mg total) into the vein every 6 (six) hours as needed for nausea or vomiting.   oxyCODONE  5 MG immediate release tablet Commonly known as: Oxy IR/ROXICODONE  Place 1-2 tablets (5-10 mg total) into feeding tube every 3 (three) hours as needed for severe pain (pain score 7-10) (for CPOT > 2).   polyethylene glycol 17 g packet Commonly known as: MIRALAX  / GLYCOLAX  Place 17 g into feeding tube daily as needed for mild constipation.   potassium chloride  20 MEQ packet Commonly known as: KLOR-CON  Place 20 mEq into feeding tube every 4 (four) hours.   sodium chloride  flush 0.9 % Soln Commonly known as: NS 10 mLs by Intrapleural route every 8 (eight) hours.   sodium chloride  flush 0.9 % Soln Commonly known as: NS 10-40 mLs by Intracatheter route every  12 (twelve) hours.   sodium chloride  flush 0.9 % Soln Commonly known as: NS Inject 3 mLs into the vein every 12 (twelve) hours.   spironolactone  25 MG tablet Commonly known as: ALDACTONE  Place 1 tablet (25 mg total) into feeding tube daily. Start taking on: April 02, 2024        Follow-up Information     Kerrin Elspeth BROCKS, MD Follow up.   Specialty: Cardiothoracic Surgery Why: Please see discharge paperwork for details of follow-up appointment with Dr. Chrystal office.  He will obtain a chest x-ray on the second floor of the same Bldg. 1 hour prior to seeing Dr. Kerrin. Contact information: [address] [place] [address] [phone]         Anner Alm ORN, MD Follow up.   Specialty: Cardiology Why: Please see discharge paperwork for details of follow-up appointment with cardiology.  Your first visit may be with a nurse practitioner or physician assistant. Contact information: [address] [place] [address] [phone]                 Signed:  Lemond FORBES Cera, PA-C  04/01/2024, 10:31 AM

## 2024-03-17 NOTE — Interval H&P Note (Signed)
 History and Physical Interval Note:  03/17/2024 7:13 AM  Alan [name]  has presented today for surgery, with the diagnosis of CAD.  The various methods of treatment have been discussed with the patient and family. After consideration of risks, benefits and other options for treatment, the patient has consented to  Procedure(s): CORONARY ARTERY BYPASS GRAFTING (CABG) (N/A) ECHOCARDIOGRAM, TRANSESOPHAGEAL, INTRAOPERATIVE (N/A) as a surgical intervention.  The patient's history has been reviewed, patient examined, no change in status, stable for surgery.  I have reviewed the patient's chart and labs.  Questions were answered to the patient's satisfaction.     Elspeth JAYSON Millers

## 2024-03-17 NOTE — Anesthesia Procedure Notes (Signed)
 Central Venous Catheter Insertion Performed by: Lucious Debby BRAVO, MD, anesthesiologist Start/End8/11/2023 7:22 AM, 03/17/2024 7:23 AM Patient location: Pre-op. Preanesthetic checklist: patient identified, IV checked, risks and benefits discussed, surgical consent, monitors and equipment checked, pre-op evaluation, timeout performed and anesthesia consent Position: Trendelenburg Hand hygiene performed  and maximum sterile barriers used  Total catheter length 10. PA cath was placed.Swan type:thermodilution PA Cath depth:50 Procedure performed without using ultrasound guided technique. Attempts: 1 Patient tolerated the procedure well with no immediate complications.

## 2024-03-17 NOTE — Anesthesia Procedure Notes (Signed)
 Procedure Name: Intubation Date/Time: 03/17/2024 7:45 AM  Performed by: Lamar Lucie DASEN, CRNAPre-anesthesia Checklist: Patient identified, Emergency Drugs available, Suction available and Patient being monitored Patient Re-evaluated:Patient Re-evaluated prior to induction Oxygen Delivery Method: Circle system utilized Preoxygenation: Pre-oxygenation with 100% oxygen Induction Type: IV induction Ventilation: Mask ventilation without difficulty Laryngoscope Size: Glidescope and 4 Grade View: Grade I Tube type: Oral Tube size: 8.0 mm Number of attempts: 1 Airway Equipment and Method: Stylet and Oral airway Placement Confirmation: ETT inserted through vocal cords under direct vision, positive ETCO2 and breath sounds checked- equal and bilateral Secured at: 25 cm Tube secured with: Tape Dental Injury: Teeth and Oropharynx as per pre-operative assessment

## 2024-03-17 NOTE — Progress Notes (Signed)
  Echocardiogram Echocardiogram Transesophageal has been performed.  Devora Ellouise SAUNDERS 03/17/2024, 8:52 AM

## 2024-03-17 NOTE — OR Nursing (Signed)
 1544: pt chest tube noted to be at , Dr. Kerrin paged.   1552: call to 2h to talk to Dr. Kerrin, pt chest tube at , told to continue to bring pt to 2h.   1558: pt chest tube at while patient departs OR room.

## 2024-03-18 ENCOUNTER — Inpatient Hospital Stay (HOSPITAL_COMMUNITY)

## 2024-03-18 ENCOUNTER — Encounter (HOSPITAL_COMMUNITY): Payer: Self-pay | Admitting: Thoracic Surgery (Cardiothoracic Vascular Surgery)

## 2024-03-18 DIAGNOSIS — G4089 Other seizures: Secondary | ICD-10-CM | POA: Diagnosis not present

## 2024-03-18 DIAGNOSIS — J90 Pleural effusion, not elsewhere classified: Secondary | ICD-10-CM | POA: Diagnosis not present

## 2024-03-18 DIAGNOSIS — Z9911 Dependence on respirator [ventilator] status: Secondary | ICD-10-CM

## 2024-03-18 DIAGNOSIS — Z95828 Presence of other vascular implants and grafts: Secondary | ICD-10-CM

## 2024-03-18 DIAGNOSIS — D62 Acute posthemorrhagic anemia: Secondary | ICD-10-CM | POA: Diagnosis not present

## 2024-03-18 DIAGNOSIS — I214 Non-ST elevation (NSTEMI) myocardial infarction: Secondary | ICD-10-CM | POA: Diagnosis not present

## 2024-03-18 DIAGNOSIS — R579 Shock, unspecified: Secondary | ICD-10-CM | POA: Diagnosis not present

## 2024-03-18 DIAGNOSIS — G40401 Other generalized epilepsy and epileptic syndromes, not intractable, with status epilepticus: Secondary | ICD-10-CM | POA: Diagnosis not present

## 2024-03-18 DIAGNOSIS — Z951 Presence of aortocoronary bypass graft: Secondary | ICD-10-CM

## 2024-03-18 LAB — BASIC METABOLIC PANEL WITH GFR
Anion gap: 11 (ref 5–15)
Anion gap: 10 (ref 5–15)
Anion gap: 10 (ref 5–15)
BUN: 18 mg/dL (ref 8–23)
BUN: 18 mg/dL (ref 8–23)
BUN: 24 mg/dL — ABNORMAL HIGH (ref 8–23)
CO2: 21 mmol/L — ABNORMAL LOW (ref 22–32)
CO2: 22 mmol/L (ref 22–32)
CO2: 23 mmol/L (ref 22–32)
Calcium: 7.9 mg/dL — ABNORMAL LOW (ref 8.9–10.3)
Calcium: 8 mg/dL — ABNORMAL LOW (ref 8.9–10.3)
Calcium: 7.7 mg/dL — ABNORMAL LOW (ref 8.9–10.3)
Chloride: 111 mmol/L (ref 98–111)
Chloride: 109 mmol/L (ref 98–111)
Chloride: 108 mmol/L (ref 98–111)
Creatinine, Ser: 1.07 mg/dL (ref 0.61–1.24)
Creatinine, Ser: 1.06 mg/dL (ref 0.61–1.24)
Creatinine, Ser: 1.19 mg/dL (ref 0.61–1.24)
GFR, Estimated: >60 mL/min (ref >60)
GFR, Estimated: >60 mL/min (ref >60)
GFR, Estimated: >60 mL/min (ref >60)
Glucose, Bld: 136 mg/dL — ABNORMAL HIGH (ref 70–99)
Glucose, Bld: 133 mg/dL — ABNORMAL HIGH (ref 70–99)
Glucose, Bld: 119 mg/dL — ABNORMAL HIGH (ref 70–99)
Potassium: 3.9 mmol/L (ref 3.5–5.1)
Potassium: 4.5 mmol/L (ref 3.5–5.1)
Potassium: 4.2 mmol/L (ref 3.5–5.1)
Sodium: 143 mmol/L (ref 135–145)
Sodium: 141 mmol/L (ref 135–145)
Sodium: 141 mmol/L (ref 135–145)

## 2024-03-18 LAB — CBC
HCT: 26.4 % — ABNORMAL LOW (ref 39.0–52.0)
HCT: 22.7 % — ABNORMAL LOW (ref 39.0–52.0)
HCT: 21.8 % — ABNORMAL LOW (ref 39.0–52.0)
HCT: 23.2 % — ABNORMAL LOW (ref 39.0–52.0)
Hemoglobin: 9.1 g/dL — ABNORMAL LOW (ref 13.0–17.0)
Hemoglobin: 8 g/dL — ABNORMAL LOW (ref 13.0–17.0)
Hemoglobin: 7.5 g/dL — ABNORMAL LOW (ref 13.0–17.0)
Hemoglobin: 7.9 g/dL — ABNORMAL LOW (ref 13.0–17.0)
MCH: 30.1 pg (ref 26.0–34.0)
MCH: 30.5 pg (ref 26.0–34.0)
MCH: 30.2 pg (ref 26.0–34.0)
MCH: 30.3 pg (ref 26.0–34.0)
MCHC: 34.5 g/dL (ref 30.0–36.0)
MCHC: 35.2 g/dL (ref 30.0–36.0)
MCHC: 34.4 g/dL (ref 30.0–36.0)
MCHC: 34.1 g/dL (ref 30.0–36.0)
MCV: 87.4 fL (ref 80.0–100.0)
MCV: 86.6 fL (ref 80.0–100.0)
MCV: 87.9 fL (ref 80.0–100.0)
MCV: 88.9 fL (ref 80.0–100.0)
Platelets: 160 K/uL (ref 150–400)
Platelets: 102 K/uL — ABNORMAL LOW (ref 150–400)
Platelets: 100 K/uL — ABNORMAL LOW (ref 150–400)
Platelets: 114 K/uL — ABNORMAL LOW (ref 150–400)
RBC: 3.02 MIL/uL — ABNORMAL LOW (ref 4.22–5.81)
RBC: 2.62 MIL/uL — ABNORMAL LOW (ref 4.22–5.81)
RBC: 2.48 MIL/uL — ABNORMAL LOW (ref 4.22–5.81)
RBC: 2.61 MIL/uL — ABNORMAL LOW (ref 4.22–5.81)
RDW: 16.5 % — ABNORMAL HIGH (ref 11.5–15.5)
RDW: 16.8 % — ABNORMAL HIGH (ref 11.5–15.5)
RDW: 17.1 % — ABNORMAL HIGH (ref 11.5–15.5)
RDW: 17.2 % — ABNORMAL HIGH (ref 11.5–15.5)
WBC: 13.9 K/uL — ABNORMAL HIGH (ref 4.0–10.5)
WBC: 9.4 K/uL (ref 4.0–10.5)
WBC: 11.4 K/uL — ABNORMAL HIGH (ref 4.0–10.5)
WBC: 12.8 K/uL — ABNORMAL HIGH (ref 4.0–10.5)
nRBC: 0 % (ref 0.0–0.2)
nRBC: 0 % (ref 0.0–0.2)
nRBC: 0 % (ref 0.0–0.2)
nRBC: 0 % (ref 0.0–0.2)

## 2024-03-18 LAB — BPAM PLATELET PHERESIS
Blood Product Expiration Date: 202508072359
Blood Product Expiration Date: 202508072359
Blood Product Expiration Date: 202508052359
ISSUE DATE / TIME: 202508041335
ISSUE DATE / TIME: 202508041335
ISSUE DATE / TIME: 202508041701
Unit Type and Rh: 5100
Unit Type and Rh: 6200
Unit Type and Rh: 6200

## 2024-03-18 LAB — MAGNESIUM
Magnesium: 2.5 mg/dL — ABNORMAL HIGH (ref 1.7–2.4)
Magnesium: 2.6 mg/dL — ABNORMAL HIGH (ref 1.7–2.4)
Magnesium: 2.5 mg/dL — ABNORMAL HIGH (ref 1.7–2.4)

## 2024-03-18 LAB — BPAM FFP
Blood Product Expiration Date: 202508042359
Blood Product Expiration Date: 202508052359
Blood Product Expiration Date: 202508052359
Blood Product Expiration Date: 202508052359
ISSUE DATE / TIME: 202508041334
ISSUE DATE / TIME: 202508041334
ISSUE DATE / TIME: 202508041334
ISSUE DATE / TIME: 202508041334
Unit Type and Rh: 6200
Unit Type and Rh: 6200
Unit Type and Rh: 6200
Unit Type and Rh: 6200

## 2024-03-18 LAB — GLUCOSE, CAPILLARY
Glucose-Capillary: 103 mg/dL — ABNORMAL HIGH (ref 70–99)
Glucose-Capillary: 107 mg/dL — ABNORMAL HIGH (ref 70–99)
Glucose-Capillary: 114 mg/dL — ABNORMAL HIGH (ref 70–99)
Glucose-Capillary: 118 mg/dL — ABNORMAL HIGH (ref 70–99)
Glucose-Capillary: 123 mg/dL — ABNORMAL HIGH (ref 70–99)
Glucose-Capillary: 123 mg/dL — ABNORMAL HIGH (ref 70–99)
Glucose-Capillary: 130 mg/dL — ABNORMAL HIGH (ref 70–99)
Glucose-Capillary: 130 mg/dL — ABNORMAL HIGH (ref 70–99)
Glucose-Capillary: 134 mg/dL — ABNORMAL HIGH (ref 70–99)
Glucose-Capillary: 135 mg/dL — ABNORMAL HIGH (ref 70–99)
Glucose-Capillary: 139 mg/dL — ABNORMAL HIGH (ref 70–99)
Glucose-Capillary: 140 mg/dL — ABNORMAL HIGH (ref 70–99)
Glucose-Capillary: 64 mg/dL — ABNORMAL LOW (ref 70–99)
Glucose-Capillary: 83 mg/dL (ref 70–99)
Glucose-Capillary: 95 mg/dL (ref 70–99)

## 2024-03-18 LAB — COOXEMETRY PANEL
Carboxyhemoglobin: 1.3 % (ref 0.5–1.5)
Methemoglobin: <0.7 % (ref 0.0–1.5)
O2 Saturation: 58 %
Total hemoglobin: 8.8 g/dL — ABNORMAL LOW (ref 12.0–16.0)

## 2024-03-18 LAB — PREPARE FRESH FROZEN PLASMA

## 2024-03-18 LAB — BPAM CRYOPRECIPITATE
Blood Product Expiration Date: 202508062359
ISSUE DATE / TIME: 202508041334
Unit Type and Rh: 6200

## 2024-03-18 LAB — PREPARE PLATELET PHERESIS
Unit division: 0
Unit division: 0
Unit division: 0

## 2024-03-18 LAB — PHOSPHORUS: Phosphorus: 4.7 mg/dL — ABNORMAL HIGH (ref 2.5–4.6)

## 2024-03-18 LAB — PREPARE CRYOPRECIPITATE: Unit division: 0

## 2024-03-18 MED ORDER — PROSOURCE TF20 ENFIT COMPATIBL EN LIQD
60.0000 mL | Freq: Every day | ENTERAL | Status: DC
  Administered 2024-03-18: 60 mL
  Filled 2024-03-18: qty 60

## 2024-03-18 MED ORDER — PROSOURCE TF20 ENFIT COMPATIBL EN LIQD
60.0000 mL | Freq: Two times a day (BID) | ENTERAL | Status: DC
  Administered 2024-03-18 – 2024-03-24 (×13): 60 mL
  Filled 2024-03-18 (×12): qty 60

## 2024-03-18 MED ORDER — LEVETIRACETAM (KEPPRA) 500 MG/5 ML ADULT IV PUSH
3000.0000 mg | INTRAVENOUS | Status: AC
  Administered 2024-03-18: 3000 mg via INTRAVENOUS
  Filled 2024-03-18: qty 30

## 2024-03-18 MED ORDER — LEVETIRACETAM 250 MG PO TABS: 500.0000 mg | ORAL_TABLET | Freq: Two times a day (BID) | ORAL | Status: DC

## 2024-03-18 MED ORDER — IOHEXOL 350 MG/ML SOLN
75.0000 mL | Freq: Once | INTRAVENOUS | Status: AC | PRN
  Administered 2024-03-18: 75 mL via INTRAVENOUS

## 2024-03-18 MED ORDER — FENTANYL CITRATE PF 50 MCG/ML IJ SOSY
50.0000 ug | PREFILLED_SYRINGE | INTRAMUSCULAR | Status: DC | PRN
  Administered 2024-03-22: 50 ug via INTRAVENOUS
  Filled 2024-03-18: qty 1

## 2024-03-18 MED ORDER — LEVETIRACETAM (KEPPRA) 500 MG/5 ML ADULT IV PUSH
500.0000 mg | Freq: Two times a day (BID) | INTRAVENOUS | Status: DC
  Filled 2024-03-18: qty 5

## 2024-03-18 MED ORDER — PROPOFOL 1000 MG/100ML IV EMUL
0.0000 ug/kg/min | INTRAVENOUS | Status: DC
  Administered 2024-03-18: 20 ug/kg/min via INTRAVENOUS
  Administered 2024-03-18 (×2): 60 ug/kg/min via INTRAVENOUS
  Administered 2024-03-19: 40 ug/kg/min via INTRAVENOUS
  Administered 2024-03-19: 30 ug/kg/min via INTRAVENOUS
  Filled 2024-03-18: qty 200
  Filled 2024-03-18 (×4): qty 100

## 2024-03-18 MED ORDER — PANTOPRAZOLE SODIUM 40 MG IV SOLR
40.0000 mg | Freq: Every day | INTRAVENOUS | Status: DC
  Administered 2024-03-18 – 2024-03-20 (×3): 40 mg via INTRAVENOUS
  Filled 2024-03-18 (×3): qty 10

## 2024-03-18 MED ORDER — DOCUSATE SODIUM 50 MG/5ML PO LIQD
100.0000 mg | Freq: Two times a day (BID) | ORAL | Status: DC
  Administered 2024-03-18 – 2024-03-23 (×10): 100 mg
  Filled 2024-03-18 (×10): qty 10

## 2024-03-18 MED ORDER — POLYETHYLENE GLYCOL 3350 17 G PO PACK
17.0000 g | PACK | Freq: Every day | ORAL | Status: DC
  Administered 2024-03-18 – 2024-03-23 (×6): 17 g
  Filled 2024-03-18 (×6): qty 1

## 2024-03-18 MED ORDER — FENTANYL CITRATE PF 50 MCG/ML IJ SOSY
50.0000 ug | PREFILLED_SYRINGE | INTRAMUSCULAR | Status: DC | PRN
  Administered 2024-03-18 – 2024-03-19 (×2): 50 ug via INTRAVENOUS
  Filled 2024-03-18 (×2): qty 1

## 2024-03-18 MED ORDER — PROPOFOL 10 MG/ML IV BOLUS
50.0000 mg | Freq: Once | INTRAVENOUS | Status: AC
  Administered 2024-03-18: 50 mg via INTRAVENOUS

## 2024-03-18 MED ORDER — ATORVASTATIN CALCIUM 80 MG PO TABS
80.0000 mg | ORAL_TABLET | Freq: Every day | ORAL | Status: DC
  Administered 2024-03-19 – 2024-03-26 (×11): 80 mg
  Filled 2024-03-18 (×8): qty 1

## 2024-03-18 MED ORDER — VITAL 1.5 CAL PO LIQD
1000.0000 mL | ORAL | Status: DC
  Administered 2024-03-18 – 2024-03-22 (×7): 1000 mL
  Filled 2024-03-18 (×3): qty 1000

## 2024-03-18 MED ORDER — CALCIUM GLUCONATE-NACL 1-0.675 GM/50ML-% IV SOLN
1.0000 g | Freq: Once | INTRAVENOUS | Status: AC
  Administered 2024-03-18: 1000 mg via INTRAVENOUS
  Filled 2024-03-18: qty 50

## 2024-03-18 MED ORDER — VITAL HP 1.0 CAL PO LIQD
1000.0000 mL | ORAL | Status: DC
  Administered 2024-03-18: 1000 mL

## 2024-03-18 MED ORDER — OXYCODONE HCL 5 MG PO TABS
5.0000 mg | ORAL_TABLET | ORAL | Status: DC | PRN
  Administered 2024-03-18: 10 mg
  Administered 2024-03-19 (×2): 5 mg
  Filled 2024-03-18: qty 2
  Filled 2024-03-18 (×2): qty 1

## 2024-03-18 MED ORDER — LEVETIRACETAM (KEPPRA) 500 MG/5 ML ADULT IV PUSH
500.0000 mg | Freq: Two times a day (BID) | INTRAVENOUS | Status: DC
  Administered 2024-03-18 – 2024-03-19 (×2): 500 mg via INTRAVENOUS
  Filled 2024-03-18 (×2): qty 5

## 2024-03-18 NOTE — TOC Progression Note (Signed)
 Transition of Care [facility]) - Progression Note    Patient Details  Name: Alan [name] MRN: [id number] Date of Birth: 1955/10/10  [facility] CM/SW Contact  Justina Delcia Czar, RN Phone Number:[phone] 03/18/2024, 4:05 PM  Clinical Narrative:    Spoke to wife at bedside. Pt was independent pta. [facility] following for [facility]. Will need PT/OT evaluation and recommendation.   Will continue to follow for dc needs.    Expected Discharge Plan: Home w Home Health Services Barriers to Discharge: Continued Medical Work up               Expected Discharge Plan and Services In-house Referral: NA Discharge Planning Services: CM Consult Post Acute Care Choice: Home Health Living arrangements for the past 2 months: Single Family Home                   DME Agency: NA                   Social Drivers of Health (SDOH) Interventions SDOH Screenings   Food Insecurity: No Food Insecurity (03/11/2024)  Housing: Low Risk  (03/11/2024)  Transportation Needs: No Transportation Needs (03/11/2024)  Utilities: Not At Risk (03/11/2024)  Depression (PHQ2-9): Low Risk  (10/05/2022)  Social Connections: Socially Integrated (03/10/2024)  Tobacco Use: Low Risk  (03/17/2024)    Readmission Risk Interventions     No data to display

## 2024-03-18 NOTE — Progress Notes (Addendum)
 Initial Nutrition Assessment  DOCUMENTATION CODES:   Not applicable  INTERVENTION:   Tube feeding via OG tube: Vital 1.5 at 55 ml/h (1320 ml per day) Prosource TF20 60 ml BID Provides 2140 kcal, 129 gm protein, 1008 ml free water daily.  NUTRITION DIAGNOSIS:   Inadequate oral intake related to inability to eat as evidenced by NPO status.  GOAL:   Patient will meet greater than or equal to 90% of their needs  MONITOR:   Vent status, TF tolerance, Skin  REASON FOR ASSESSMENT:   Consult Enteral/tube feeding initiation and management  ASSESSMENT:   68 yo male admitted with NSTEMI. PMH includes metastatic lung cancer (in remission), CAD, STEMI 2004, MVP, MVR, prior stents and mini - mitral valve repair (2015), HLD, HTN, GERD.  7/28: cardiac cath 8/04: CABG x 3 with intra-op dissection of aorta requiring aortic graft and AVR  Prior to CABG surgery, patient was on a heart healthy diet consuming 100% of meals. Per chart review, patient was drinking 32 oz of Xango fruit juice every day PTA.  Received MD Consult for initiation of trickle TF. OG tube in place.   Patient with small L PTX and residual R hemothorax. Chest tubes in place to suction. Neuro consulted for seizures, possible hypoxic / anoxic brain damage; propofol  has been increased  Patient is currently intubated on ventilator support MV: 10.9 L/min Temp (24hrs), Avg:95.7 F (35.4 C), Min:94.5 F (34.7 C), Max:98.8 F (37.1 C)  Propofol : 10 ml/hr providing 264 kcal per day; increased to 30.7 ml/hr this afternoon (810 kcal/d)  Labs reviewed. Phos 4.7 CBG: [phone]  Medications reviewed and include dulcolax, colace, protonix , miralax , precedex , epinephrine , insulin  drip, levophed , propofol .  I/O +12,782 ml x 24 h UOP 2,225 ml x 24 h Chest tubes with 1,775 ml output x 24 h  Weight history reviewed. No significant weight changes noted PTA. Admit weight: 79 kg Current weight: 85.3 kg  NUTRITION -  FOCUSED PHYSICAL EXAM:  Unable to complete at this time  Diet Order:   Diet Order     None       EDUCATION NEEDS:   No education needs have been identified at this time  Skin:  Skin Assessment: Skin Integrity Issues: Skin Integrity Issues:: Stage I, Incisions, Other (Comment) Stage I: R heel Incisions: surgical incisions to chest, R leg, L groin Other: R wrist puncture site for cardiac cath  Last BM:  8/2  Height:   Ht Readings from Last 1 Encounters:  03/11/24 6' 4 (1.93 m)    Weight:   Wt Readings from Last 1 Encounters:  03/18/24 85.3 kg    Ideal Body Weight:  91.8 kg  BMI:  Body mass index is 22.89 kg/m.  Estimated Nutritional Needs:   Kcal:  2000-2200  Protein:  120-135 gm  Fluid:  2-2.2 L   Suzen HUNT RD, LDN, CNSC Contact via secure chat. If unavailable, use group chat RD Inpatient.

## 2024-03-18 NOTE — Op Note (Signed)
 NAME: [name], [name] MEDICAL RECORD NO: [id number] ACCOUNT NO: [id number] DATE OF BIRTH: 07/28/1956 FACILITY: [facility] LOCATION: [facility] PHYSICIAN: Elspeth BROCKS. Kerrin, MD  Operative Report   DATE OF PROCEDURE: 03/17/2024  PREOPERATIVE DIAGNOSIS:  Three-vessel coronary artery disease.  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  PROCEDURES PERFORMED:  Median sternotomy, extracorporeal circulation,  Coronary artery bypass grafting x 3 (left internal mammary artery to LAD, saphenous vein graft to obtuse marginal, saphenous vein graft to posterior descending).  Endoscopic vein harvest right leg.   Hemiarch repair of ascending aortic dissection with resuspension of aortic valve under moderate hypothermic circulatory arrest using 30 mm HemoShield Platinum graft.  SURGEON:  Elspeth BROCKS. Kerrin, MD.  FIRST ASSISTANT:  Linnie Rayas, MD.  SECOND ASSISTANT:  Lemond Cera, [place].  ANESTHESIA:  General.  FINDINGS:  Pre-bypass transesophageal echocardiography showed ejection fraction of 40-45% with good function of the repaired mitral valve with only mild mitral regurgitation.  Intraoperative demonstrated aortic dissection.  Post bypass showed preserved left ventricular function, albeit on multiple pressors.  Trace aortic insufficiency.  No change in mitral valve.  Vein good quality.  Mammary good quality.  Moderate intrapericardial adhesions, except at RA-SVC junction where they were severe.  Coronaries good quality targets.  Developed aortic dissection after removal of cross clamp with dissection extending from cross clamp site into the noncoronary sinus of Valsalva.  Minimal extension into the right and left sinuses.  No extension past the cannulation site in the arch.  CLINICAL NOTE:  Alan [name] is a 68 year old gentleman with a history of a mitral valve repair via right mini thoracotomy.  He has multiple other medical problems.  He was found  to have three-vessel coronary artery disease and was referred for coronary artery bypass grafting.  The indications, risks, benefits, and alternatives were discussed in detail with the patient.  He understood and accepted the risks and agreed to proceed.  OPERATIVE NOTE:  Alan [name] was brought to the operating room on 03/17/2024.  He had induction of general anesthesia and was intubated.  He had transesophageal echocardiography performed by Dr. Debby Like.  Please see his separately dictated note for full details.  A Foley catheter was placed.  Intravenous antibiotics were administered.  The chest, abdomen, and legs were prepped and draped in the usual sterile fashion.  A median sternotomy was performed and the left internal mammary artery was harvested using standard technique.  Simultaneously, an incision was made in the medial aspect of the right leg at the level of the knee.  The greater saphenous vein was harvested endoscopically from the upper calf to the groin.  Both the saphenous vein and mammary artery were good quality vessels.  2000 units of heparin  were administered during the vessel harvest and a full heparin  dose was given prior to opening the pericardium.  A sternal retractor was placed and was gradually opened.  The pericardium was opened.  There were no adhesions on the anterior surface of the heart, but there were extensive adhesions along the right atrium and the right lateral side of the aorta.  The remainder of the full heparin  dose was given.  After confirming adequate anticoagulation with ACT measurement, the aorta was cannulated via concentric 2-0 Ethibond pledgeted pursestring sutures.  There was some bleeding around the cannulation site and an additional pursestring suture was placed to help with that.  It was clear that there was going to be extremely difficult to try to place an atrial  cannula; therefore, the decision was made to femoral cannulate from the groin.  A 23-French  venous cannula was placed via the right femoral artery using the Seldinger technique and gradual dilatation prior to passing the catheter.  Position of the cannula was confirmed with transesophageal echocardiography.  Cardiopulmonary bypass was initiated.  Flows were maintained per protocol.  The patient was cooled to 32 degrees Celsius.  The coronary arteries were inspected and anastomotic sites were chosen and the conduits were inspected and cut to length.  A foam pad was placed in the pericardium to insulate the heart.  A temperature probe was placed in the myocardial septum and a cardioplegia cannula was placed in the ascending aorta.  Of note, the ramus intermedius branch was too small to graft.  All of the coronaries were relatively small.  The aorta was cross-clamped.  The left ventricle was emptied via the aortic root vent.  Cardiac arrest then was achieved with a combination of cold antegrade blood cardioplegia and topical iced saline.  There was a rapid diastolic arrest and temperature going to 15  degrees Celsius with 1 liter of cardioplegia.  A reversed saphenous vein graft then was placed end to side to the proximal posterior descending.  The posterior descending had some plaque distally, but a 1 mm probe did pass beyond that.  A 1.5-mm probe did pass retrograde into the right coronary.  The vein was of good quality and the artery was of good quality at the site of the anastomosis.  The anastomosis was performed with a running 7-0 Prolene suture.  At the completion of the anastomosis, cardioplegia was administered and there was good flow and good hemostasis.  Next, a reversed saphenous vein graft was placed end to side to the distal OM branch of the left circumflex.  This was a 1.5-mm vessel, good quality.  The vein was good quality.  It was anastomosed end to side with a running 7-0 Prolene suture.  Again, a probe passed easily proximally and distally and there was good flow and good hemostasis  with cardioplegia administration.  The left internal mammary artery was brought through a window in the pericardium.  The distal end was beveled.  It was anastomosed end-to-side to the distal LAD.  A running 8-0 Prolene suture was used.  At the completion of the anastomosis, the Bulldog clamp was removed and rapid septal rewarming was noted.  The Bulldog clamp was replaced.  The mammary pedicle was tacked to the epicardial surface of the heart with 6-0 Prolene sutures.  Additional cardioplegia was administered down the vein grafts and the aortic root.  The vein grafts were cut to length for the proximal vein graft anastomoses.  These were performed to 4.0-mm punch aortotomies with running 6-0 Prolene sutures.  At the completion of the final proximal anastomosis, the patient was placed in Trendelenburg position.  Lidocaine  was administered.  The aortic root was de-aired and the aortic cross clamp was removed.  After removal of the cross clamp, the patient spontaneously resumed sinus rhythm.  Epicardial pacing wires were placed on the right ventricle.  Inspection of the anastomosis showed good hemostasis at the distal anastomoses, but there was bleeding from the needle hole in the aorta, which was repaired with a 6-0 Prolene suture.  Within a couple of minutes, multiple other needle  hole bleeding sites were noted in the ascending aorta around the proximal anastomosis.  Transesophageal echocardiography confirmed the suspicion of an aortic dissection.  The patient was cooled  and remained on bypass.  An incision was made in the left groin and the left femoral artery was dissected out.  Proximal and distal control were obtained.  A 5-0 Prolene pursestring suture was placed.  An arterial cannula was placed via the left femoral artery using the Seldinger technique and gradual dilatation.  Once the cannula was in place, the arterial inflow was switched from the aortic cannula to the femoral cannula.  The right atrium was  further dissected out and a retrograde cardioplegia cannula was placed via pursestring suture in the right atrium and directed into the coronary sinus.  It was not possible to place a left ventricular vent.  The aorta was cross-clamped and cardiac arrest was achieved with cold KBC blood cardioplegia via the retrograde cannula.  The aorta was transected.  There was an obvious dissection.  The dissection extended into the noncoronary sinus nearly to the annulus.  In the left and right sinuses it only extended as far as the beginning of the coronary ostia.  There was a tear in the ascending aorta at the site where the cross clamp had been in place.  The aortic valve leaflets were normal.  The root was reconstructed using Teflon felt and BioGlue and the aortic valve was resuspended using 4-0 Prolene pledgeted sutures.  At this point, the patient had cooled to 24 degrees Celsius.  He was placed in Trendelenburg position and Etomidate  and steroids were administered.  When his BIS reached 0, flow was ceased and the aortic cross clamp was removed.  There was no flap in the arch and no tear distal to the aortic cannula.  The aorta did have a dissection posteriorly and Teflon felt and BioGlue were used to reinforce that area.  A hemiarch repair then was done using a 30-mm HemoShield graft, which was sewn end-to-end with a running 4-0 Prolene suture.  A  Teflon felt strip was used to buttress the aortic side of the anastomosis.  After completing the distal anastomosis, flow was resumed, de-airing the graft and a clamp was placed on the graft.  There was bleeding from behind the aorta, which was repaired with 4-0 Prolene pledgeted sutures.  Full flow then was resumed.  The circulatory arrest time was 30 minutes; however, the patient was receiving between 1 and 2 liters of blood per minute during a significant portion of that time.  Systemic rewarming was begun.  The proximal anastomosis then was performed again reinforcing the  aorta with Teflon felt strip and using a running 4-0 Prolene suture.  It should be noted that BioGlue was placed around the anastomosis to assist with needle hole bleeding.  The proximal and distal ends of the graft then were trimmed and a graft-to-graft anastomosis was performed with a running 4-0 Prolene suture.  At the completion of this anastomosis, the patient was placed in Trendelenburg position.  De-airing was performed.  Reanimation dose of blood cardioplegia was administered and the aortic cross clamp was removed.  The total cross clamp time combined was 135 minutes.  As rewarming continued, all proximal and distal anastomoses were inspected for hemostasis.  Additional sutures were placed as needed.  Epicardial wires were placed on the right atrium in addition to the ones that were already in place on the right ventricle.  Milrinone , epinephrine  and norepinephrine , and vasopressin  infusions were initiated.  When the patient had rewarmed to a core temperature of 36.5 degrees Celsius, he was weaned from cardiopulmonary bypass on the first attempt.  Total  bypass time was 282 minutes.  The patient's ventricle was initially hyperdynamic and the milrinone  infusion was decreased.  He continued to have relatively high requirements for pressors due to vasoplegia.  The initial cardiac index was greater than 2 liters per minute per meter squared.  The post bypass transesophageal echocardiography showed good function of the aortic valve with trace central AI.  There was improved left ventricular function compared to preop albeit on inotropes.  Test dose of protamine  was administered and was well tolerated.  The femoral venous cannula was removed.  After all the patient's blood had been returned, the femoral cannula was removed and the pursestring suture was tied.  There was good hemostasis at that site.  Pressure was held at the femoral vein site for 25 minutes.  The chest was copiously irrigated with saline.   Hemostasis was achieved.  There was coagulopathic bleeding and packed red blood cells, fresh frozen plasma, cryoprecipitate, and platelets were administered.  A left pleural and chest tube was placed and then two drains were placed in the mediastinum, one in the anterior mediastinum, one along the diaphragmatic surface of the pericardium.  The pericardium was reapproximated over the aortic graft with interrupted 3-0 silk sutures.  The sternum was closed with a combination of single and double heavy-gauge stainless steel wires.  The patient tolerated the sternal closure well hemodynamically.  The remainder of the incision was closed in standard fashion.  The groin incision was closed in standard fashion.  Skin staples were used in the left groin.  All sponge, needle, and instrument counts were correct at the end of the procedure.  The patient was transported from the operating room to the surgical intensive care unit, intubated and in critical condition.  Experienced assistance was necessary for this case due to surgical complexity.  Lemond Cera assisted by independently harvesting the saphenous vein and then provided retraction of delicate tissues, exposure, suctioning and suture management during the anastomoses.  Dr. Shyrl provided retraction of delicate tissues, exposure, suctioning and suture management during the aortic replacement.  MUK D: 03/17/2024 6:06:08 pm T: 03/18/2024 3:07:00 am  JOB: [id number]/ [id number]

## 2024-03-18 NOTE — Progress Notes (Signed)
 Heart Failure Navigator Progress Note  Assessed for Heart & Vascular TOC clinic readiness.  Patient does not meet criteria due to Advanced Heart Failure Team patient of Dr. Gala Romney. .   Navigator will sign off at this time.   Rhae Hammock, BSN, Scientist, clinical (histocompatibility and immunogenetics) Only

## 2024-03-18 NOTE — Progress Notes (Signed)
 Patient ID: DEAVION DOBBS, male   DOB: August 24, 1955, 68 y.o.   MRN: [id number]  TCTS Evening Rounds:  Hemodynamics stable on epi 3, milrinone  0.125, NE 9. CI 2.6.  Remains on vent on Propofol  for seizure activity. Suspected due to anoxic encephalopathy.   Head CT showed no acute intracranial abnormality. EEG in progress. Has been seen by neurology.  UO ok CT output low.  BMET    Component Value Date/Time   NA 141 03/18/2024 1641   NA 141 02/25/2020 0915   K 4.2 03/18/2024 1641   CL 108 03/18/2024 1641   CO2 23 03/18/2024 1641   GLUCOSE 119 (H) 03/18/2024 1641   BUN 24 (H) 03/18/2024 1641   BUN 14 02/25/2020 0915   CREATININE 1.19 03/18/2024 1641   CREATININE 0.74 12/26/2023 1154   CREATININE 0.80 02/27/2014 1152   CALCIUM  7.7 (L) 03/18/2024 1641   GFRNONAA >60 03/18/2024 1641   GFRNONAA >60 12/26/2023 1154   CBC    Component Value Date/Time   WBC 11.4 (H) 03/18/2024 1641   RBC 2.48 (L) 03/18/2024 1641   HGB 7.5 (L) 03/18/2024 1641   HGB 13.1 12/26/2023 1154   HCT 21.8 (L) 03/18/2024 1641   PLT 100 (L) 03/18/2024 1641   PLT 175 12/26/2023 1154   MCV 87.9 03/18/2024 1641   MCH 30.2 03/18/2024 1641   MCHC 34.4 03/18/2024 1641   RDW 17.1 (H) 03/18/2024 1641   LYMPHSABS 0.8 03/10/2024 1215   MONOABS 0.8 03/10/2024 1215   EOSABS 0.4 03/10/2024 1215   BASOSABS 0.1 03/10/2024 1215

## 2024-03-18 NOTE — Progress Notes (Signed)
 Patient in CT at this time will try back later for Routine EEG.

## 2024-03-18 NOTE — Consult Note (Signed)
 NEUROLOGY CONSULT NOTE   Date of service: March 18, 2024 Patient Name: Alan [name] MRN:  [id number] DOB:  1955-11-10 Chief Complaint: Seizure-like activity Requesting Provider: Kerrin Elspeth BROCKS, MD  History of Present Illness  Alan [name] is a 68 y.o. male with hx of coronary artery disease status post PCI, hypertension hyperlipidemia mitral valve prolapse status post repair, SVT status post ablation, metastatic small lung cancer in remission who was admitted to the hospital after an NSTEMI, underwent CABG given three-vessel disease on angio, EF 30 to 35% with regional wall motion abnormalities.  His CABG was complicated by IntraOp aortic dissection.  He remains intubated and sedated.  This morning there was concern for seizure activity and downward gaze. He was given 2 mg of Versed  IV, he is on propofol  20 mcg/kg/min. Neurology consult was obtained for management of possible underlying seizures.  Seizures were described as whole-body diffuse jerking movements involving the extremities and abdomen.  LKW: Unclear Modified rankin score: Unable to ascertain IV Thrombolysis: No-unclear last known well, recent CABG EVT: Unclear last known well, nonlocalizing exam NIH scale 32    ROS  Cannot be performed due to his mentation  Past History   Past Medical History:  Diagnosis Date   Adenocarcinoma of right lung, stage 3 (HCC) dx'd 03/2019   CAD S/P percutaneous coronary angioplasty 2004   (Ant STEMI) - Prox LAD 100% => 2 overlapping 3.5 x 1.8 mm Cypher DES stents.;  Patent as of August 2015   Dyslipidemia, goal LDL below 70    Essential hypertension    GERD (gastroesophageal reflux disease)    History of Mitral valve prolapse    Moderate - with moderate MR, noted February t 2013   History of radiation therapy    Right Pelvis & left humerus- 12/05/21-12/16/21- Dr. Lynwood Nasuti   History of Severe mitral regurgitation by prior echocardiogram 02/06/2014   TEE: Severe mitral  regurgitation with a flail P2 segment and ruptured; Normal LV size & function, dilated LA.   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    Past Surgical History:  Procedure Laterality Date   ANTERIOR CRUCIATE LIGAMENT REPAIR Left 1993   COLONOSCOPY     CORONARY ARTERY BYPASS GRAFT N/A 03/17/2024   Procedure: CORONARY ARTERY BYPASS GRAFTING TIMES THREE , USING LEFT INTERNAL MAMMARY ARTERY AND ENDOSCOPIC HARVESTED RIGHT SAPHENOUS VEIN;  Surgeon: Kerrin Elspeth BROCKS, MD;  Location: [facility] OR;  Service: Open Heart Surgery;  Laterality: N/A;   CORONARY BALLOON ANGIOPLASTY N/A 03/10/2024   Procedure: CORONARY BALLOON ANGIOPLASTY;  Surgeon: Anner Alm ORN, MD;  Location: [facility] INVASIVE CV LAB;  Service: Cardiovascular;  Laterality: N/A;   INTRAOPERATIVE TRANSESOPHAGEAL ECHOCARDIOGRAM N/A 04/08/2014   Procedure: INTRAOPERATIVE TRANSESOPHAGEAL ECHOCARDIOGRAM;  Surgeon: Sudie VEAR Laine, MD;  Location: [facility] OR;  Service: Open Heart Surgery;  Laterality: N/A;   INTRAOPERATIVE TRANSESOPHAGEAL ECHOCARDIOGRAM N/A 03/17/2024   Procedure: ECHOCARDIOGRAM,  TRANSESOPHAGEAL, INTRAOPERATIVE;  Surgeon: Kerrin Elspeth BROCKS, MD;  Location: [facility] OR;  Service: Open Heart Surgery;  Laterality: N/A;   IR RADIOLOGIST EVAL & MGMT  10/04/2022   IR RADIOLOGIST EVAL & MGMT  11/01/2022   IR RADIOLOGIST EVAL & MGMT  12/06/2022    IR RADIOLOGIST EVAL & MGMT  01/23/2023   LEFT AND RIGHT HEART CATHETERIZATION WITH CORONARY ANGIOGRAM N/A 03/05/2014   Procedure: LEFT AND RIGHT HEART CATHETERIZATION WITH CORONARY ANGIOGRAM;  Surgeon: Alm LELON Clay, MD;  Location: [facility] CATH LAB;  Service: Cardiovascular; (pre-op) Widely patent LAD stents, 40% distal LAD, 30% Cx, ~50% PL-PDA bifurcation lesion    LEFT HEART CATH AND CORONARY ANGIOGRAPHY  2004   In setting of anterior STEMI, found to have 100% % proximal LAD (2 DES Cypher stents)   LEFT HEART CATH AND CORONARY ANGIOGRAPHY  2007   Widely patent LAD stents, 40% distal LAD, 30% Cx, ~50% PL-PDA bifurcation lesion    LEFT HEART CATH AND CORONARY ANGIOGRAPHY N/A 03/10/2024   Procedure: LEFT HEART CATH AND CORONARY ANGIOGRAPHY;  Surgeon: Clay Alm LELON, MD;  Location: [facility] INVASIVE CV LAB;  Service: Cardiovascular;  Laterality: N/A;   LEFT HEART CATHETERIZATION WITH CORONARY ANGIOGRAM N/A 09/15/2011   Procedure: LEFT HEART CATHETERIZATION WITH CORONARY ANGIOGRAM;  Surgeon: Dorn JINNY Lesches, MD;  Location: [facility] CATH LAB;  Service: Cardiovascular;  Laterality: N/A;   MITRAL VALVE REPAIR Right 04/08/2014   Procedure: MINIMALLY INVASIVE MITRAL VALVE REPAIR (MVR);  Surgeon: Sudie VEAR Laine, MD;  Location: [facility] OR;  Service: Open Heart Surgery;  Laterality: Right;   NM MYOVIEW  LTD  12/2009   Walk 9 minutes, and 10 METs, diaphragmatic attenuation but no ischemia or infarction.   REPAIR OF ACUTE ASCENDING THORACIC AORTIC DISSECTION  03/17/2024   Procedure: REPAIR, AORTIC DISSECTION, ASCENDING USING 30 MM HEMASHIELD PLATINUM VASCULAR GRAFT RESUSPENSION OF AORTIC VALVE;  Surgeon: Kerrin Elspeth BROCKS, MD;  Location: [facility] OR;  Service: Open Heart Surgery;;   SVT ABLATION N/A 10/17/2019   Procedure: SVT ABLATION;  Surgeon: Inocencio Soyla Lunger, MD;  Location: [facility] INVASIVE CV LAB;  Service: Cardiovascular;  Laterality: N/A;   TEE WITHOUT CARDIOVERSION N/A 02/06/2014   Procedure: TRANSESOPHAGEAL ECHOCARDIOGRAM  (TEE);  Surgeon: Vinie KYM Maxcy, MD;  Normal LV Size U& function - EF 55-60%, no regional WMA.  MV P2 Leaflet is flail with ruptured chord with severe prolapse, anterior leaflet intact.  Severe, eccentric anterior directed MR with dilated LA.   TRANSTHORACIC ECHOCARDIOGRAM  07/18/2018   Normal LV size and function.  EF 50-60 %.  Unable to assess diastolic function.  Mitral valve sewing ring in place.  Mild stenosis with a gradient of 6 mmHg noted. -   TRANSTHORACIC ECHOCARDIOGRAM  09/30/2021   Stable findings.  EF 50 to 55%.  No RWMA.  Mitral valve stable.  No significant MR.  Normal aortic valve.  Mild aortic dilation, but probably normal for age.   VIDEO BRONCHOSCOPY WITH ENDOBRONCHIAL ULTRASOUND N/A 05/16/2019   Procedure: VIDEO BRONCHOSCOPY WITH ENDOBRONCHIAL ULTRASOUND;  Surgeon: Mannam, Praveen, MD;  Location: [facility] OR;  Service: Pulmonary;  Laterality: N/A;    Family History: Family History  Problem Relation Age of Onset   Hypertension Mother    Heart Problems Father        triple bypass 1989   Cancer Paternal Grandmother        Pancreatic    Social History  reports that he has never smoked. He has never used smokeless tobacco. He reports current  alcohol use of about 10.0 standard drinks of alcohol per week. He reports that he does not use drugs.  Allergies  Allergen Reactions   A-Cillin [Ampicillin] Shortness Of Breath, Swelling and Rash    Did it involve swelling of the face/tongue/throat, SOB, or low BP? Yes Did it involve sudden or severe rash/hives, skin peeling, or any reaction on the inside of your mouth or nose? Yes Did you need to seek medical attention at a hospital or doctor's office? Yes When did it last happen? ~20 years ago If all above answers are NO, may proceed with cephalosporin use.     Amoxicillin Hives, Shortness Of Breath and Swelling    Did it involve swelling of the face/tongue/throat, SOB, or low BP? Yes Did it involve sudden or severe rash/hives,  skin peeling, or any reaction on the inside of your mouth or nose? Yes Did you need to seek medical attention at a hospital or doctor's office? Yes When did it last happen? ~20 years ago If all above answers are NO, may proceed with cephalosporin use.    Other Hives, Shortness Of Breath and Swelling    ALL CILLINS   Penicillins Hives, Shortness Of Breath and Swelling    Did it involve swelling of the face/tongue/throat, SOB, or low BP? Yes Did it involve sudden or severe rash/hives, skin peeling, or any reaction on the inside of your mouth or nose? Yes Did you need to seek medical attention at a hospital or doctor's office? Yes When did it last happen? ~20 years ago If all above answers are NO, may proceed with cephalosporin use.     Sulfa Antibiotics Nausea Only   Crestor  [Rosuvastatin ] Other (See Comments)    G I upset    Medications   Current Facility-Administered Medications:    0.45 % sodium chloride  infusion, , Intravenous, Continuous PRN, Gold, Wayne E, PA-C, Last Rate: 10 mL/hr at 03/18/24 1200, Infusion Verify at 03/18/24 1200   0.9 %  sodium chloride  infusion (Manually program via Guardrails IV Fluids), , Intravenous, Once, Kerrin Elspeth BROCKS, MD   0.9 %  sodium chloride  infusion (Manually program via Guardrails IV Fluids), , Intravenous, Once, Jenna Maude BRAVO, NP   0.9 %  sodium chloride  infusion, 250 mL, Intravenous, Continuous, Gold, Wayne E, PA-C, Last Rate: 1 mL/hr at 03/18/24 1200, Infusion Verify at 03/18/24 1200   0.9 %  sodium chloride  infusion, , Intravenous, Continuous, Gold, Wayne E, PA-C, Last Rate: 10 mL/hr at 03/18/24 1200, Infusion Verify at 03/18/24 1200   acetaminophen  (TYLENOL ) tablet 1,000 mg, 1,000 mg, Oral, Q6H, 1,000 mg at 03/18/24 1208 **OR** acetaminophen  (TYLENOL ) 160 MG/5ML solution 1,000 mg, 1,000 mg, Per Tube, Q6H, Gold, Wayne E, PA-C   albumin  human 5 % solution 12.5 g, 250 mL, Intravenous, Q15 min PRN, Gold, Wayne E, PA-C, Stopped at  03/18/24 0335   [COMPLETED] amiodarone  (NEXTERONE ) 1.8 mg/mL load via infusion 150 mg, 150 mg, Intravenous, Once, 150 mg at 03/17/24 1621 **FOLLOWED BY** [EXPIRED] amiodarone  (NEXTERONE  PREMIX) 360-4.14 MG/200ML-% (1.8 mg/mL) IV infusion, 60 mg/hr, Intravenous, Continuous, Stopping previously hung infusion at 03/18/24 0700 **FOLLOWED BY** amiodarone  (NEXTERONE  PREMIX) 360-4.14 MG/200ML-% (1.8 mg/mL) IV infusion, 30 mg/hr, Intravenous, Continuous, Jenna Maude BRAVO, NP, Last Rate: 16.67 mL/hr at 03/18/24 1200, 30 mg/hr at 03/18/24 1200   aspirin  EC tablet 325 mg, 325 mg, Oral, Daily **OR** aspirin  chewable tablet 324 mg, 324 mg, Per Tube, Daily, Gold, Wayne E, PA-C, 324 mg at 03/18/24 0956   atorvastatin  (LIPITOR) tablet  80 mg, 80 mg, Oral, Daily, Gold, Wayne E, PA-C, 80 mg at 03/18/24 9043   bisacodyl  (DULCOLAX) EC tablet 10 mg, 10 mg, Oral, Daily, 10 mg at 03/18/24 0956 **OR** bisacodyl  (DULCOLAX) suppository 10 mg, 10 mg, Rectal, Daily, Gold, Wayne E, PA-C   Chlorhexidine  Gluconate Cloth 2 % PADS 6 each, 6 each, Topical, Daily, Kerrin Elspeth BROCKS, MD, 6 each at 03/17/24 2200   Chlorhexidine  Gluconate Cloth 2 % PADS 6 each, 6 each, Topical, Daily, Hendrickson, Steven C, MD   dexmedetomidine  (PRECEDEX ) 400 MCG/100ML (4 mcg/mL) infusion, 0-0.7 mcg/kg/hr, Intravenous, Continuous, Gold, Wayne E, PA-C, Last Rate: 11.85 mL/hr at 03/18/24 1200, 0.6 mcg/kg/hr at 03/18/24 1200   dextrose  50 % solution 0-50 mL, 0-50 mL, Intravenous, PRN, Gold, Wayne E, PA-C   docusate (COLACE) 50 MG/5ML liquid 100 mg, 100 mg, Per Tube, BID, Gretta Leita SQUIBB, DO, 100 mg at 03/18/24 9042   docusate sodium  (COLACE) capsule 200 mg, 200 mg, Oral, Daily, Gold, Wayne E, PA-C   EPINEPHrine  (ADRENALIN ) 5 mg in NS 250 mL (0.02 mg/mL) premix infusion, 0-10 mcg/min, Intravenous, Titrated, Gold, Wayne E, PA-C, Last Rate: 9 mL/hr at 03/18/24 1200, 3 mcg/min at 03/18/24 1200   feeding supplement (PROSource TF20) liquid 60 mL, 60 mL, Per Tube,  Daily, Gretta Leita SQUIBB, DO, 60 mL at 03/18/24 1208   feeding supplement (VITAL HIGH PROTEIN) liquid 1,000 mL, 1,000 mL, Per Tube, Q24H, Clark, Laura P, DO, 1,000 mL at 03/18/24 1220   fentaNYL  (SUBLIMAZE ) injection 50 mcg, 50 mcg, Intravenous, Q15 min PRN, Gretta Leita SQUIBB, DO, 50 mcg at 03/18/24 0940   fentaNYL  (SUBLIMAZE ) injection 50-200 mcg, 50-200 mcg, Intravenous, Q30 min PRN, Gretta Leita SQUIBB, DO   insulin  regular, human (MYXREDLIN ) 100 units/ 100 mL infusion, , Intravenous, Continuous, Gold, Wayne E, PA-C, Last Rate: 4.4 mL/hr at 03/18/24 1200, 4.4 Units/hr at 03/18/24 1200   ipratropium-albuterol  (DUONEB) 0.5-2.5 (3) MG/3ML nebulizer solution 3 mL, 3 mL, Nebulization, Q4H PRN, Gretta Leita SQUIBB, DO   lactated ringers  infusion, , Intravenous, Continuous, Gold, Wayne E, PA-C   lactated ringers  infusion, , Intravenous, Continuous, Gold, Wayne E, PA-C, Last Rate: 10 mL/hr at 03/18/24 1200, Infusion Verify at 03/18/24 1200   levETIRAcetam  (KEPPRA ) undiluted injection 3,000 mg, 3,000 mg, Intravenous, STAT, Voncile Isles, MD   levETIRAcetam  (KEPPRA ) undiluted injection 500 mg, 500 mg, Intravenous, Q12H, Meryn Sarracino, MD   metoprolol  tartrate (LOPRESSOR ) tablet 12.5 mg, 12.5 mg, Oral, BID **OR** metoprolol  tartrate (LOPRESSOR ) 25 mg/10 mL oral suspension 12.5 mg, 12.5 mg, Per Tube, BID, Gold, Wayne E, PA-C   metoprolol  tartrate (LOPRESSOR ) injection 2.5-5 mg, 2.5-5 mg, Intravenous, Q2H PRN, Gold, Wayne E, PA-C   midazolam  (VERSED ) injection 2 mg, 2 mg, Intravenous, Q1H PRN, Gold, Wayne E, PA-C, 2 mg at 03/18/24 0805   milrinone  (PRIMACOR ) 20 MG/100 ML (0.2 mg/mL) infusion, 0.125 mcg/kg/min, Intravenous, Continuous, Kerrin Elspeth BROCKS, MD, Last Rate: 2.96 mL/hr at 03/18/24 1320, 0.125 mcg/kg/min at 03/18/24 1320   norepinephrine  (LEVOPHED ) 16 mg in (0.064 mg/mL) premix infusion, 0-40 mcg/min, Intravenous, Titrated, Kerrin Elspeth BROCKS, MD, Last Rate: 8.44 mL/hr at 03/18/24 1200, 9 mcg/min at  03/18/24 1200   ondansetron  (ZOFRAN ) injection 4 mg, 4 mg, Intravenous, Q6H PRN, Gold, Wayne E, PA-C   Oral care mouth rinse, 15 mL, Mouth Rinse, Q2H, Kerrin Elspeth BROCKS, MD, 15 mL at 03/18/24 1230   oxyCODONE  (Oxy IR/ROXICODONE ) immediate release tablet 5-10 mg, 5-10 mg, Oral, Q3H PRN, Gold, Wayne E, PA-C, 10 mg at 03/18/24  0303   [START ON 03/19/2024] pantoprazole  (PROTONIX ) EC tablet 40 mg, 40 mg, Oral, Daily, Gold, Wayne E, PA-C   pantoprazole  (PROTONIX ) injection 40 mg, 40 mg, Intravenous, QHS, Gold, Wayne E, PA-C, 40 mg at 03/17/24 2150   polyethylene glycol (MIRALAX  / GLYCOLAX ) packet 17 g, 17 g, Per Tube, Daily, Gretta Doffing P, DO, 17 g at 04/01/24 0957   propofol  (DIPRIVAN ) 1000 MG/100ML infusion, 0-80 mcg/kg/min, Intravenous, Continuous, Gretta Doffing SQUIBB, DO, Last Rate: 10.24 mL/hr at 04-01-2024 1200, 20 mcg/kg/min at 04-01-2024 1200   sodium chloride  flush (NS) 0.9 % injection 10-40 mL, 10-40 mL, Intracatheter, Q12H, Kerrin Elspeth BROCKS, MD, 10 mL at Apr 01, 2024 1000   sodium chloride  flush (NS) 0.9 % injection 3 mL, 3 mL, Intravenous, Q12H, Gold, Wayne E, PA-C, 3 mL at 2024/04/01 1000   sodium chloride  flush (NS) 0.9 % injection 3 mL, 3 mL, Intravenous, PRN, Viviane Lemond BRAVO, PA-C  Vitals   Vitals:   04/01/24 0730 Apr 01, 2024 0745 2024/04/01 0800 04-01-24 1143  BP:   (!) 120/49 (!) 132/40  Pulse: 85 86 85   Resp: 16 16 16    Temp: 98.4 F (36.9 C) 98.4 F (36.9 C) (!) 96.8 F (36 C)   TempSrc:   Core   SpO2: 96% 96% 100%   Weight:      Height:        Body mass index is 22.89 kg/m.   Physical Exam   General: Well-developed well-nourished man in no acute distress, sedated and intubated HEENT: Normocephalic atraumatic Neurological exam Sedated intubated No spontaneous movements noted at the time of my initial examination Pupils are small but slightly reactive bilaterally. Corneal reflexes present sluggishly bilaterally He has cough present To noxious stimulation no  movement   Labs/Imaging/Neurodiagnostic studies   CBC:  Recent Labs  Lab 04-01-24 0200 04/01/24 1130  WBC 13.9* 9.4  HGB 9.1* 8.0*  HCT 26.4* 22.7*  MCV 87.4 86.6  PLT 160 102*   Basic Metabolic Panel:  Lab Results  Component Value Date   NA 141 01-Apr-2024   K 4.5 2024/04/01   CO2 22 2024/04/01   GLUCOSE 133 (H) April 01, 2024   BUN 18 04/01/2024   CREATININE 1.06 2024/04/01   CALCIUM  8.0 (L) Apr 01, 2024   GFRNONAA >60 01-Apr-2024   GFRAA >60 05/12/2020   Lipid Panel:  Lab Results  Component Value Date   LDLCALC 58 03/12/2024   HgbA1c:  Lab Results  Component Value Date   HGBA1C 5.0 03/12/2024   INR  Lab Results  Component Value Date   INR 1.8 (H) 03/17/2024   APTT  Lab Results  Component Value Date   APTT 44 (H) 03/17/2024   AED levels: No results found for: PHENYTOIN, ZONISAMIDE, LAMOTRIGINE, LEVETIRACETA  CT Head without contrast(Personally reviewed): No acute findings  CT angio Head and Neck with contrast(Personally reviewed): No acute intracranial findings. Dissection of the aortic arch and descending thoracic artery with edema surrounding the proximal left subclavian artery but no luminal stenosis.  Entire arch is not included in the study.   Mild irregular stenosis of the M1 segments of the MCAs bilaterally.  Neurodiagnostics rEEG:  Initial part of the study with complete suppression.  After withholding propofol , LPD's seen with clinical whole body jerking activity  ASSESSMENT   Alan [name] is a 68 y.o. male past history of CAD hypertension hyperlipidemia, who had a CABG complicated by aortic dissection, had concern for whole body jerking. Initial examination was on sedation and not very revealing but  after holding sedation, he started having whole body twitching with LPD's seen on EEG. Head CT and CTA head and neck were done to ensure no intracranial dissection or strokes-unremarkable. I suspect that he might of sustained some amount  of hypoxic/anoxic brain damage due to the fact that he is eliciting these myoclonic jerks and has no prior history of seizures. On propofol  at the rate of 20, he had near complete suppression of brain activity.  I withheld the propofol  for a little while and he started to have clinical myoclonus along with LPD's seen on EEG.  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 Loaded with Keppra  3 g IV x 1 followed by standing doses of Keppra  500 mg twice daily. LTM EEG to continue Seizure precautions Cardiac supportive care per the cardiothoracic/cardiology and PCCM teams. He has sternal wires for now.  Consider MRI for structural evaluation of the brain when wires can come out. Neurology will continue to follow Plan discussed with Dr. Gretta   ______________________________________________________________________    Signed, Eligio Lav, MD [facility]   CRITICAL CARE ATTESTATION Performed by: Eligio Lav, MD Total critical care time: 55 minutes Critical care time was exclusive of separately billable procedures and treating other patients and/or supervising APPs/Residents/Students Critical care was necessary to treat or prevent imminent or life-threatening deterioration. This patient is critically ill and at significant risk for neurological worsening and/or death and care requires constant monitoring. Critical care was time spent personally by me on the following activities: development of treatment plan with patient and/or surrogate as well as nursing, discussions with consultants, evaluation of patient's response to treatment, examination of patient, obtaining history from patient or surrogate, ordering and performing treatments and interventions, ordering and review of laboratory studies, ordering and review of radiographic studies, pulse oximetry, re-evaluation of  patient's condition, participation in multidisciplinary rounds and medical decision making of high complexity in the care of this patient.

## 2024-03-18 NOTE — Progress Notes (Signed)
 Pt was transported to CT scan and back to 2h12 without complications.

## 2024-03-18 NOTE — Progress Notes (Addendum)
 Advanced Heart Failure Rounding Note  Cardiologist: [name], MD   Chief Complaint: Post cardiotomy shock   Subjective:    Unable to extubate overnight. Concern for seizure activity overnight and this am. Diffuse jerking movements noted in abdomen and extremities. Sedation restarted.  Remains on 0.125 milrinone  + 3 Epi + 8 NE. CI by TD 2.88 at 5 am. ? Positioning of PA catheter based on PA and CVP waveforms.  Dumped 800 cc from chest tubes with turning around 12 am, CT output has slowed since.    Objective:   Weight Range: 85.3 kg Body mass index is 22.89 kg/m.   Vital Signs:   Temp:  [94.1 F (34.5 C)-98.8 F (37.1 C)] 98.2 F (36.8 C) (08/05 0605) Pulse Rate:  [68-100] 91 (08/05 0605) Resp:  [8-37] 23 (08/05 0605) BP: (101-127)/(46-74) 127/50 (08/05 0600) SpO2:  [91 %-100 %] 96 % (08/05 0605) Arterial Line BP: (75-222)/(3-212) 149/43 (08/05 0605) FiO2 (%):  [50 %-60 %] 50 % (08/05 0400) Weight:  [85.3 kg] 85.3 kg (08/05 0500) Last BM Date : 03/15/24  Weight change: Filed Weights   03/10/24 1212 03/11/24 0141 03/18/24 0500  Weight: 79 kg 79 kg 85.3 kg    Intake/Output:   Intake/Output Summary (Last 24 hours) at 03/18/2024 0712 Last data filed at 03/18/2024 0700 Gross per 24 hour  Intake 8205.95 ml  Output 5425 ml  Net 2780.95 ml      Physical Exam    General:  Sedated on vent. Cor: Regular rate & rhythm. Sternal incision intact. Lungs: clear anteriorly Abdomen: Soft, nondistended.  Extremities: 1-2+ edema   Telemetry   SR 80s-90s  Labs    CBC Recent Labs    03/17/24 2258 03/18/24 0200  WBC 13.3* 13.9*  HGB 9.4* 9.1*  HCT 27.3* 26.4*  MCV 87.8 87.4  PLT 155 160   Basic Metabolic Panel Recent Labs    91/95/74 2200 03/18/24 0500  NA 143 143  K 3.6 3.9  CL 112* 111  CO2 21* 21*  GLUCOSE 170* 136*  BUN 12 18  CREATININE 0.93 1.07  CALCIUM  7.8* 7.9*  MG 2.7* 2.5*   Liver Function Tests No results for input(s): AST,  ALT, ALKPHOS, BILITOT, PROT, ALBUMIN  in the last 72 hours. No results for input(s): LIPASE, AMYLASE in the last 72 hours. Cardiac Enzymes No results for input(s): CKTOTAL, CKMB, CKMBINDEX, TROPONINI in the last 72 hours.  BNP: BNP (last 3 results) No results for input(s): BNP in the last 8760 hours.  ProBNP (last 3 results) No results for input(s): PROBNP in the last 8760 hours.   D-Dimer No results for input(s): DDIMER in the last 72 hours. Hemoglobin A1C No results for input(s): HGBA1C in the last 72 hours. Fasting Lipid Panel No results for input(s): CHOL, HDL, LDLCALC, TRIG, CHOLHDL, LDLDIRECT in the last 72 hours. Thyroid  Function Tests No results for input(s): TSH, T4TOTAL, T3FREE, THYROIDAB in the last 72 hours.  Invalid input(s): FREET3  Other results:   Imaging    DG Chest Port 1 View Result Date: 03/17/2024 CLINICAL DATA:  Post CABG. EXAM: PORTABLE CHEST 1 VIEW COMPARISON:  03/16/2024 FINDINGS: Endotracheal tube has tip 4.9 cm above the carina. Right IJ Swan-Ganz catheter has tip over the main pulmonary artery segment. Mediastinal drain present. Enteric tube has tip over the stomach in the left upper quadrant. Left-sided chest tube in place. Lungs are adequately inflated with opacification over the right mid to lower lung likely effusion with atelectasis. Suggestion  of a small amount of left pleural fluid with linear atelectasis left base. Small left apical pneumothorax. Remainder the exam is unremarkable. IMPRESSION: 1. Opacification over the right mid to lower lung likely effusion with atelectasis. Suggestion of a small amount of left pleural fluid with linear atelectasis left base. 2. Tubes and lines as described. Small left apical pneumothorax with left-sided chest tube in place. Electronically Signed   By: Toribio Agreste M.D.   On: 03/17/2024 17:21   ECHO INTRAOPERATIVE TEE Result Date: 03/17/2024  *INTRAOPERATIVE  TRANSESOPHAGEAL REPORT *  Patient Name:   Alan [name] Date of Exam: 03/17/2024 Medical Rec #:  [id number]        Height:       76.0 in Accession #:    [id number]       Weight:       174.2 lb Date of Birth:  1956-07-02         BSA:          2.09 m Patient Age:    68 years         BP:           101/73 mmHg Patient Gender: M                HR:           65 bpm. Exam Location:  Anesthesiology Transesophogeal exam was perform intraoperatively during surgical procedure. Patient was closely monitored under general anesthesia during the entirety of examination. Indications:     I25.110 Atherosclerotic heart disease of native coronary artery                  with unstable angina pectoris Performing Phys: [id number] STEVEN C HENDRICKSON Diagnosing Phys: Lynwood Cornea PROCEDURE: Intraoperative Transesophogeal Pre-bypass images obtained by T. Lucious, MD; post-bypass images obtained by K. Cornea, MD. Complications: No known complications during this procedure. POST-OP IMPRESSIONS _ Left Ventricle: has normal systolic function on inotropic support with epinephrine  and milrinone . _ Right Ventricle: The right ventricle appears unchanged from pre-bypass; normal function. _ Aorta: Status post ascending aorta replacement. There is a dissection flap present, visualized in the descending thoracic aorta. Color flow primarily in the true lumen with small fenestrations noted. _ Aortic Valve: No stenosis present. There is trace regurgitation. _ Mitral Valve: The mitral valve appears unchanged from pre-bypass; Mild to moderate regurgitation, mild stenosis (mean gradient of at a heart rate of 84BPM) _ Tricuspid Valve: The tricuspid valve appears unchanged from pre-bypass. There is mild regurgitation. _ Pulmonic Valve: The pulmonic valve appears unchanged from pre-bypass. _ Interatrial Septum: The interatrial septum appears unchanged from pre-bypass. _ Pericardium: The pericardium appears unchanged from pre-bypass. PRE-OP FINDINGS  Left Ventricle:  The left ventricle has mild-moderately reduced systolic function, with an ejection fraction of 40-45%. The cavity size was normal. Right Ventricle: The right ventricle has normal systolic function. The cavity was normal. There is no increase in right ventricular wall thickness. Catheter present in the right ventricle. Left Atrium: Left atrial size was not assessed. No left atrial/left atrial appendage thrombus was detected. Right Atrium: Right atrial size was not assessed. Interatrial Septum: No atrial level shunt detected by color flow Doppler. Pericardium: There is no evidence of pericardial effusion. There is no pleural effusion. Mitral Valve: The mitral valve has been repaired/replaced. Mitral valve regurgitation is mild by color flow Doppler. There is mild mitral stenosis. Tricuspid Valve: The tricuspid valve was normal in structure. Tricuspid valve regurgitation is mild by color flow  Doppler. Aortic Valve: The aortic valve is normal in structure. Aortic valve regurgitation is trivial by color flow Doppler. There is no stenosis of the aortic valve. Pulmonic Valve: The pulmonic valve was normal in structure No evidence of pumonic stenosis. Pulmonic valve regurgitation is mild by color flow Doppler.  Lynwood Cornea Electronically signed by Lynwood Cornea Signature Date/Time: 03/17/2024/5:10:33 PM    Final      Medications:     Scheduled Medications:  sodium chloride    Intravenous Once   sodium chloride    Intravenous Once   acetaminophen   1,000 mg Oral Q6H   Or   acetaminophen  (TYLENOL ) oral liquid 160 mg/5 mL  1,000 mg Per Tube Q6H   aspirin  EC  325 mg Oral Daily   Or   aspirin   324 mg Per Tube Daily   atorvastatin   80 mg Oral Daily   bisacodyl   10 mg Oral Daily   Or   bisacodyl   10 mg Rectal Daily   Chlorhexidine  Gluconate Cloth  6 each Topical Daily   Chlorhexidine  Gluconate Cloth  6 each Topical Daily   docusate sodium   200 mg Oral Daily   metoCLOPramide  (REGLAN ) injection  10 mg Intravenous Q6H    metoprolol  tartrate  12.5 mg Oral BID   Or   metoprolol  tartrate  12.5 mg Per Tube BID   mouth rinse  15 mL Mouth Rinse Q2H   [START ON 03/19/2024] pantoprazole   40 mg Oral Daily   pantoprazole  (PROTONIX ) IV  40 mg Intravenous QHS   potassium chloride   20 mEq Per Tube Q4H   sodium chloride  flush  10-40 mL Intracatheter Q12H   sodium chloride  flush  3 mL Intravenous Q12H    Infusions:  sodium chloride  Stopped (03/18/24 0428)   sodium chloride  1 mL/hr at 03/18/24 0700   sodium chloride  10 mL/hr at 03/18/24 0700   albumin  human Stopped (03/18/24 0335)   amiodarone  30 mg/hr (03/18/24 0700)   dexmedetomidine  (PRECEDEX ) IV infusion 0.7 mcg/kg/hr (03/18/24 0700)   epinephrine  3 mcg/min (03/18/24 0700)   insulin  3 Units/hr (03/18/24 0700)   lactated ringers      lactated ringers  10 mL/hr at 03/18/24 0700   levofloxacin  (LEVAQUIN ) IV     milrinone  0.125 mcg/kg/min (03/18/24 0700)   norepinephrine  (LEVOPHED ) Adult infusion 8 mcg/min (03/18/24 0700)   phenylephrine  (NEO-SYNEPHRINE) Adult infusion      PRN Medications: sodium chloride , albumin  human, dextrose , ipratropium-albuterol , metoprolol  tartrate, midazolam , morphine  injection, ondansetron  (ZOFRAN ) IV, oxyCODONE , sodium chloride  flush, traMADol     Patient Profile  68 y.o. male with history of CAD, severe MR s/p minimally invasive mitral valve repair, stage III NSCLC.   Patient admitted with NSTEMI and new systolic CHF. Underwent 3v CABG X 3 c/b ascending aortic dissection and post cardiotomy shock.  Assessment/Plan   Post-cardiotomy shock -Echo 03/10/24: EF 30-35%, anterior WMA, RV mildly reduced -CABG X 3 c/b ascending aortic dissection requiring Bentall and resuspension of AoV. -Currently on 0.125 milrinone  + 3 Epi + 8 NE. CI 2.8 by TD. ? PA catheter placement given appearance of CVP and PA waveforms. Will review with Dr. Cherrie. -Echo today   2. NSTEMI with known CAD -Remote anterior STEMI with prior stents to  LAD -NSTEMI - 3 V CAD on cath with 99% in-stent restenosis in LAD, 50% residual stenosis post PTCA -CABG X 3 (LIMA to LAD, SVG to OM, SBVG to PDA) 03/17/24 as above -Aspirin  + statin.    3. Acute systolic CHF/ICM -Newly reduced EF -Echo 03/10/24: EF 30-35%,  anterior WMA, RV mildly reduced -GDMT once off inotrope/pressor support   4. Severe MR s/p minimally invasive mitral valve repair -trivial MR with mean gradient of 4 mmhg on echo this admit   5. Postop anemia/thrombocytopenia -Multiple blood products in OR and immediately post-op -CT output starting to slow -Hgb up to 9.1 and Plts stable 160K  6. NSVT -No recurrent VT overnight -Continue amiodarone  gtt while on inotropes   8. NSCLC w/bone mets -S/p chemoradiation and immunotherapy -Stable disease on most recent outpatinet imaging. Followed by Heme/Onc -Possible new sclerotic lesion/small LUL nodule on CXR this admission. Will eventually require CT.  9. Possible seizure activity -Discussed with CCM. Obtaining head CT & EEG. Neuro consult  CRITICAL CARE Performed by: COLLETTA MANUELITA SAILOR   Total critical care time: 20 minutes  Critical care time was exclusive of separately billable procedures and treating other patients.  Critical care was necessary to treat or prevent imminent or life-threatening deterioration.  Critical care was time spent personally by me on the following activities: development of treatment plan with patient and/or surrogate as well as nursing, discussions with consultants, evaluation of patient's response to treatment, examination of patient, obtaining history from patient or surrogate, ordering and performing treatments and interventions, ordering and review of laboratory studies, ordering and review of radiographic studies, pulse oximetry and re-evaluation of patient's condition.   Length of Stay: 8  FINCH, LINDSAY N, PA-C  03/18/2024, 7:12 AM  Advanced Heart Failure Team Pager [phone] (M-F; 7a - 5p)   Please contact [facility] for night-coverage after hours (5p -7a ) and weekends on [email or web address]  Agree with above.   POD #1. Remains intubated/sedated. When sedation weaned has diffuse myoclonus concerning for anoxic brain injury. Hemodynamics much improved. Bleeding from CTs has slowed.   Head CT no acute process. Neuro following and EEG in progress.   General:  Intubated/sedated. + diffuse myoclonus HEENT: normal + ETT Neck: supple. RIJ swan Cor:RRR sternal dressing intact + CTs Lungs: clear Abdomen: soft, nontender, nondistended. No hepatosplenomegaly. No bruits or masses. Good bowel sounds. Extremities: no cyanosis, clubbing, rash, tr edema Neuro:intubated/sedated  He has stabilized hemodynamically but exam concerning for severe anoxic brain injury. Await results of EEG. Discussed with his wife and CCM team at bedside.   CRITICAL CARE Performed by: Cherrie Sieving  Total critical care time: 45 minutes  Critical care time was exclusive of separately billable procedures and treating other patients.  Critical care was necessary to treat or prevent imminent or life-threatening deterioration.  Critical care was time spent personally by me (independent of midlevel providers or residents) on the following activities: development of treatment plan with patient and/or surrogate as well as nursing, discussions with consultants, evaluation of patient's response to treatment, examination of patient, obtaining history from patient or surrogate, ordering and performing treatments and interventions, ordering and review of laboratory studies, ordering and review of radiographic studies, pulse oximetry and re-evaluation of patient's condition.   Sieving Cherrie, MD  6:52 PM

## 2024-03-18 NOTE — Anesthesia Postprocedure Evaluation (Signed)
 Anesthesia Post Note  Patient: Alan [name]  Procedure(s) Performed: CORONARY ARTERY BYPASS GRAFTING TIMES THREE , USING LEFT INTERNAL MAMMARY ARTERY AND ENDOSCOPIC HARVESTED RIGHT SAPHENOUS VEIN (Chest) ECHOCARDIOGRAM, TRANSESOPHAGEAL, INTRAOPERATIVE REPAIR, AORTIC DISSECTION, ASCENDING USING 30 MM HEMASHIELD PLATINUM VASCULAR GRAFT RESUSPENSION OF AORTIC VALVE     Patient location during evaluation: ICU Anesthesia Type: General Level of consciousness: sedated Pain management: pain level controlled Vital Signs Assessment: post-procedure vital signs reviewed and stable Respiratory status: patient remains intubated per anesthesia plan and patient on ventilator - see flowsheet for VS Cardiovascular status: stable Postop Assessment: no apparent nausea or vomiting Anesthetic complications: no   No notable events documented.  Last Vitals:  Vitals:   03/18/24 0600 03/18/24 0605  BP: (!) 127/50   Pulse: 85 91  Resp: 16 (!) 23  Temp: 36.8 C 36.8 C  SpO2: 95% 96%    Last Pain:  Vitals:   03/18/24 0400  TempSrc: Core  PainSc:                  Lynwood MARLA Cornea

## 2024-03-18 NOTE — Progress Notes (Signed)
 1 Day Post-Op Procedure(s) (LRB): CORONARY ARTERY BYPASS GRAFTING TIMES THREE , USING LEFT INTERNAL MAMMARY ARTERY AND ENDOSCOPIC HARVESTED RIGHT SAPHENOUS VEIN (N/A) ECHOCARDIOGRAM, TRANSESOPHAGEAL, INTRAOPERATIVE (N/A) REPAIR, AORTIC DISSECTION, ASCENDING USING 30 MM HEMASHIELD PLATINUM VASCULAR GRAFT RESUSPENSION OF AORTIC VALVE Subjective: Intubated, sedated at present, probable seizure activity earlier  Objective: Vital signs in last 24 hours: Temp:  [94.1 F (34.5 C)-98.8 F (37.1 C)] 98.2 F (36.8 C) (08/05 0605) Pulse Rate:  [68-100] 91 (08/05 0605) Cardiac Rhythm: Normal sinus rhythm (08/05 0400) Resp:  [16-37] 23 (08/05 0605) BP: (106-127)/(46-56) 127/50 (08/05 0600) SpO2:  [91 %-100 %] 96 % (08/05 0605) Arterial Line BP: (75-222)/(3-212) 149/43 (08/05 0605) FiO2 (%):  [50 %-60 %] 50 % (08/05 0400) Weight:  [85.3 kg] 85.3 kg (08/05 0500)  Hemodynamic parameters for last 24 hours: PAP: (30-50)/(12-26) 31/14 CVP:  [3 mmHg-43 mmHg] 7 mmHg PCWP:  [20 mmHg] 20 mmHg CO:  [5.4 L/min-6.4 L/min] 6 L/min CI:  [2.6 L/min/m2-3.1 L/min/m2] 2.88 L/min/m2  Intake/Output from previous day: 08/04 0701 - 08/05 0700 In: 8206 [I.V.:3581.9; Blood:3211; NG/GT:70; IV Piggyback:1343.1] Out: 5425 [Urine:2225; Emesis/NG output:250; Blood:1175; Chest Tube:1775] Intake/Output this shift: No intake/output data recorded.  Intubated, sedated Pupils 1 mm equal Cardiac: RRR Lungs clear anteriorly Abdomen soft Ext well perfused  Lab Results: Recent Labs    03/17/24 2258 03/18/24 0200  WBC 13.3* 13.9*  HGB 9.4* 9.1*  HCT 27.3* 26.4*  PLT 155 160   BMET:  Recent Labs    03/17/24 2200 03/18/24 0500  NA 143 143  K 3.6 3.9  CL 112* 111  CO2 21* 21*  GLUCOSE 170* 136*  BUN 12 18  CREATININE 0.93 1.07  CALCIUM  7.8* 7.9*    PT/INR:  Recent Labs    03/17/24 1615  LABPROT 21.5*  INR 1.8*   ABG    Component Value Date/Time   PHART 7.409 03/17/2024 2158   HCO3 20.7  03/17/2024 2158   TCO2 22 03/17/2024 2158   ACIDBASEDEF 3.0 (H) 03/17/2024 2158   O2SAT 96 03/17/2024 2158   CBG (last 3)  Recent Labs    03/18/24 0259 03/18/24 0452 03/18/24 0651  GLUCAP 134* 130* 123*    Assessment/Plan: S/P Procedure(s) (LRB): CORONARY ARTERY BYPASS GRAFTING TIMES THREE , USING LEFT INTERNAL MAMMARY ARTERY AND ENDOSCOPIC HARVESTED RIGHT SAPHENOUS VEIN (N/A) ECHOCARDIOGRAM, TRANSESOPHAGEAL, INTRAOPERATIVE (N/A) REPAIR, AORTIC DISSECTION, ASCENDING USING 30 MM HEMASHIELD PLATINUM VASCULAR GRAFT RESUSPENSION OF AORTIC VALVE POD # 1 CABG complicated by intra-op aortic dissection  NEURO- intubated and sedated at present  Seizure activity noted earlier  Head Ct, EEG, Neurology consult CV- in SR with good hemodynamics  Milrinone  0.125, epi 3, norepi 8  On amiodarone   ECG shows QTc 545- monitor  Will keep Swan and A line for now RESP- intubated, unable to extubate due to neuro status  Right effusion on CXR  Vent per CCM RENAL- creatinine and lytes Ok  Weight above preop- will need diuresis ENDO- CBG well controlled with insulin  drip GI- NPO, OG in place Anemia secondary to ABL- Hgb 9- monitor Coagulopathy- treated Thrombocytopenia- PLT improved post transfusion   LOS: 8 days    Elspeth JAYSON Millers 03/18/2024

## 2024-03-18 NOTE — Progress Notes (Cosign Needed)
 LTM EEG hooked up and running - no initial skin breakdown - push button tested - At[facility] monitoring.

## 2024-03-18 NOTE — Progress Notes (Signed)
 Upon reducing sedation, continues to have myoclonic jerking. Increase propofol  to the 20 mcg/kg/min Bolus propofol  50 mg x 1. Already received Keppra  3 g IV x 1. Will monitor EEG and update further recommendations  Additional critical care time 10 min

## 2024-03-18 NOTE — Progress Notes (Signed)
 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  Admitted 7/28 w/ chest pain. Initially started 7/26 went to ER and CEs and EKG neg so went home but CP persisted so returned.  In ER on 28th + trop I, new T wave inversion in anterior lateral leads.  Treated w/ supplemental oxygen, morphine , started on heparin  gtt.  Went to cath lab  Findings:  99% -80% in-stent restenosis of LAD balloon angioplasty completed w/ partial restoration of flow (this re-stenosed stent was felt culprit lesion). RCA 45 to 70% stenosed, cx was 40%. Had nml LVEDP, no AS, referred to CVTS for CABG given 3V disease. and placed on plavix . EF from ECHO 30-35% w/ left regional wall motion abnormality.   Went to OR 8/4 for planned CABG x 3, as well as repair of ascending aortic dissection w/ vascular graft and repair of aortic valve  Time on bypass: ~ 4h6min EBL 1390 Cell saver 910 Received: FFP X2, PLTs 1 unit PRBC and cryo for on going surgical oozing Arrived in ICU w 480 ml bloody outpt from Chest tube Surgical team at bedside on arrival  Shortly after arrival to ICU had runs of VT and AF w/ RVR. Amiodarone  bolus and gtt initiated.  Got 2 gms calcium  another 2 units PLTs. TXA  Pertinent  Medical History  Prior CAD w/ DES X 2 and prior minimally invasive MVR 2015, NSCLC q/ mets to bone (2023) s/p chemo and XRT w/ most recent scans showing stable non-active disease  NSTEMI, HLD Significant Hospital Events: Including procedures, antibiotic start and stop dates in addition to other pertinent events   7/28 admitted w/ CP and acute inf/lat MI, cardiac cath svr 3V disease w/ re-stenosed LAD stent and  incomplete restoration of coronary flow. EF 30-35% referred for CABG 8/4 to OR. CABG x 3 v but as completing CABG noted blood loss and intra-op ECHO showed dissection of Aorta. So underwent AV graft placement and AVR. Large EBL in OR received several blood products. Immediate post op w/ large vol CT output, runs of VT and SVT. Got 2gms calcium , amiodarone  and placed on overdrive pacing via pacer critical care at bedside assisting w/ support   Interim History / Subjective:  Overnight not able to wean from vent-- had sudden gush of about 800cc from chest tube, had twitching motions when sedation lightened.  Epi 3, NE 9, milrinone  0.125.  Objective    Blood pressure (!) 127/50, pulse 91, temperature 98.2 F (36.8 C), resp. rate (!) 23, height 6' 4 (1.93 m), weight 85.3 kg, SpO2 96%. PAP: (30-50)/(12-26) 31/14 CVP:  [3 mmHg-43 mmHg] 7 mmHg PCWP:  [20 mmHg] 20 mmHg CO:  [5.4 L/min-6.4 L/min] 6 L/min CI:  [2.6 L/min/m2-3.1 L/min/m2] 2.88 L/min/m2  Vent Mode: SIMV/PC/PS FiO2 (%):  [50 %-60 %] 50 % Set Rate:  [16 bmp] 16 bmp Vt Set:  [620 mL] 620 mL PEEP:  [5 cmH20] 5 cmH20 Pressure Support:  [10 cmH20] 10 cmH20 Plateau Pressure:  [16 cmH20-18 cmH20] 16 cmH20   Intake/Output Summary (Last 24 hours) at 03/18/2024 0728 Last data filed at 03/18/2024 0700 Gross per 24 hour  Intake 8205.95 ml  Output 5425 ml  Net 2780.95 ml   Filed Weights   03/10/24 1212 03/11/24 0141 03/18/24 0500  Weight: 79 kg 79 kg 85.3 kg    Examination: General: critically ill appearing man lying in bed in NAD HENT: [place]/AT, eyes anicteric Lungs: CTA anteriorly, reduced in bases Cardiovascular: S1S2, RRR Abdomen: soft, NT Extremities: warm, no edema. R radial Aline with good distal perfusion.  Neuro: whole body rhythmic shaking with stimulation a/w slight downward gaze deviation and dilated pupils-- did wince during this and had cough reflex. Shaking aborted with midazolam . GU: clear yellow  CXR personally reviewed>  R effusion, ETT about 5 cm above carina. PA catheter in RV outflot tract vs proximal PA. Persistent pneumothorax on the left.   EKG personally reviewed> sinus rhythm, prolonged Qtc 545.  No ST changes or TWI.  Resolved problem list   Assessment and Plan   3V CAD and restenosis of DES now s/p CABG x 3 V w/ intraoperative finding of ascending aorta dissection requiring aortic graft and AVR Acute on chronic HFrEF due to ICM with cardiogenic shock. H/o HLD, HLD and CAD w/ severe mitral valve disease. Prior stents and mini- mitral valve repair (2015) for severe MR. -post-op care per TCTS -monitor chest tube output today -aspirin , statin -hold DVT prophylaxis today  -hold metoprolol  while still on vasopressors and inotropes; con't swan for monitoring -goal SBP <140, MAP >65  Post-op VT -amiodarone  -not requiring pacing currently -monitor electrolytes and replete as needed  Prolonged Qtc -additional K+ and calcium  ordered -monitor on tele -con't amiodarone  for now  Possible seizure; worry post-aortic dissection  -STAT head CT & EEG -Neuro consult -switch sedation to propofol  -midazolam  2mg  to abort seizure like activity -d/c tramadol   Post operative acute blood loss w/ consumptive coagulopathy & thrombocytopenia following prolonged CPB time  hypocalcemia  -resuscitated post-op; mostly has had reduced chest tube output -transfuse for Hb <7 or hemodynamically significant bleeding -monitor  Post-operative vent management  -LTVV -VAP prevention protocol -PAD protocol-- goal is controlling shaking given seizure concern.  Small left pneumothorax and residual right hemothorax -con't chest tubes to suction  H/o RLL stage IV lung adenocarcinoma w/ bone mets. Last PET w/ no active disease- in remission since 2023. Possible new sclerotic lesion on CXR with small LUL nodule. -CT eventually needed for further evaluation   Hyperglycemia; A1c 5.0 -post-op insulin  gtt  No family at  bedside this morning. Will update wife when she comes to bedside.   Best Practice (right click and Reselect all SmartList Selections daily)   Diet/type: NPO DVT prophylaxis SCD Pressure ulcer(s): N/A GI prophylaxis: PPI Lines: Central line, Arterial Line, and yes and it is still needed Foley:  Yes, and it is still needed Code Status:  full code Last date of multidisciplinary goals of care discussion [per primary ]  Labs   CBC: Recent Labs  Lab 03/17/24 1713 03/17/24 1740 03/17/24 1855 03/17/24 2158 03/17/24 2200 03/17/24 2258 03/18/24 0200  WBC 12.1*  --  12.2*  --  13.7* 13.3* 13.9*  HGB 9.1*   < > 8.9* 8.2* 9.5* 9.4* 9.1*  HCT 26.2*   < > 25.8* 24.0* 27.8* 27.3* 26.4*  MCV 89.1  --  88.7  --  87.1 87.8 87.4  PLT 89*  --  126*  --  162 155 160   < > = values in this interval not displayed.    Basic Metabolic Panel: Recent Labs  Lab 03/12/24 0444 03/14/24 0456  03/17/24 0503 03/17/24 0751 03/17/24 1400 03/17/24 1435 03/17/24 1438 03/17/24 1549 03/17/24 1552 03/17/24 1740 03/17/24 1838 03/17/24 2158 03/17/24 2200 03/18/24 0500  NA 141 142 139   < > 140 142   < > 143   < > 146* 146* 144 143 143  K 3.7 3.4* 3.7   < > 5.9* 4.9   < > 3.9   < > 3.7 3.6 3.6 3.6 3.9  CL 111 111 109   < > 106 107  --  106  --   --   --   --  112* 111  CO2 22 23 22   --   --   --   --   --   --   --   --   --  21* 21*  GLUCOSE 102* 95 94   < > 179* 157*  --  165*  --   --   --   --  170* 136*  BUN 11 9 14    < > 11 11  --  9  --   --   --   --  12 18  CREATININE 0.71 0.78 0.71   < > 0.50* 0.50*  --  0.50*  --   --   --   --  0.93 1.07  CALCIUM  9.2 9.1 9.4  --   --   --   --   --   --   --   --   --  7.8* 7.9*  MG  --   --   --   --   --   --   --   --   --   --   --   --  2.7* 2.5*   < > = values in this interval not displayed.    ABG    Component Value Date/Time   PHART 7.409 03/17/2024 2158   PCO2ART 32.7 03/17/2024 2158   PO2ART 80 (L) 03/17/2024 2158   HCO3 20.7 03/17/2024  2158   TCO2 22 03/17/2024 2158   ACIDBASEDEF 3.0 (H) 03/17/2024 2158   O2SAT 96 03/17/2024 2158     Coagulation Profile: Recent Labs  Lab 03/16/24 0410 03/17/24 1446 03/17/24 1615  INR 1.0 2.3* 1.8*     Critical care time:    This patient is critically ill with multiple organ system failure which requires frequent high complexity decision making, assessment, support, evaluation, and titration of therapies. This was completed through the application of advanced monitoring technologies and extensive interpretation of multiple databases. During this encounter critical care time was devoted to patient care services described in this note for 41 minutes.   Leita SHAUNNA Gaskins, DO 03/18/24 7:48 AM [facility]  For contact information, see Amion. If no response to pager, please call PCCM consult pager. After hours, 7PM- 7AM, please call Elink.

## 2024-03-18 NOTE — Procedures (Signed)
 Continuous Video-EEG Monitoring Report  CPT/Type of Study: 95720 (EEG with video, 12-26 hours)       Indications for Procedure: Altered mental status and seizure-like activity Primary neurological diagnosis: G93.40; R56.9  Duration of study: 03/18/2024 11:05 to 03/19/2024 07:30  History: This is a 68 year old male presenting with altered mental status and seizure-like activity. Continuous video-EEG monitoring was performed to evaluate for seizure.   Technical Description: The EEG was performed using standard setting per the guidelines of American Clinical Neurophysiology Society (ACNS). A minimum of 21 electrodes were placed on scalp according to the International 10-20 or/and 10-10 Systems. Supplemental electrodes were placed as needed. Single EKG electrode was also used to detect cardiac arrhythmia. Patient's behavior was continuously recorded on video simultaneously with EEG. A minimum of 16 channels were used for data display. Each epoch of study was reviewed manually daily and as needed using standard referential and bipolar montages. Computerized quantitative EEG analysis (such as compressed spectral array analysis, trending, automated spike & seizure detection) were used as indicated.   EEG Description:  Epoch: 03/18/2024 11:05 to 03/19/2024 07:30  Background: There was generalized polymorphic delta slowing. No posterior dominant rhythm or sleep architecture was recorded.   PERIODIC OR RHYTHMIC PATTERNS: None.  EPILEPTIFORM DISCHARGES: None interictally.   SEIZURES: During the initial hours of the epoch, 2 episodes of prolonged (>20 minutes) generalized myoclonic seizures consistent with /status epilepticus were captured around 13:40 and 15:05 on 03/18/2024, characterized electrographically by rhythmic generalized spike-and-waves (most prominent in midline and bilateral parasagittal regions). Clinically, intermittent myoclonic movement of body and bilateral limbs were observed on video.    EVENTS: As above.  EKG: No significant arrhythmia.   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

## 2024-03-19 ENCOUNTER — Inpatient Hospital Stay (HOSPITAL_COMMUNITY)

## 2024-03-19 DIAGNOSIS — G40409 Other generalized epilepsy and epileptic syndromes, not intractable, without status epilepticus: Secondary | ICD-10-CM

## 2024-03-19 DIAGNOSIS — I5023 Acute on chronic systolic (congestive) heart failure: Secondary | ICD-10-CM | POA: Diagnosis not present

## 2024-03-19 DIAGNOSIS — R739 Hyperglycemia, unspecified: Secondary | ICD-10-CM

## 2024-03-19 DIAGNOSIS — I214 Non-ST elevation (NSTEMI) myocardial infarction: Secondary | ICD-10-CM | POA: Diagnosis not present

## 2024-03-19 DIAGNOSIS — G931 Anoxic brain damage, not elsewhere classified: Secondary | ICD-10-CM

## 2024-03-19 DIAGNOSIS — J939 Pneumothorax, unspecified: Secondary | ICD-10-CM | POA: Diagnosis not present

## 2024-03-19 DIAGNOSIS — G253 Myoclonus: Secondary | ICD-10-CM

## 2024-03-19 LAB — COMPREHENSIVE METABOLIC PANEL WITH GFR
ALT: 361 U/L — ABNORMAL HIGH (ref 0–44)
AST: 243 U/L — ABNORMAL HIGH (ref 15–41)
Albumin: 2.5 g/dL — ABNORMAL LOW (ref 3.5–5.0)
Alkaline Phosphatase: 71 U/L (ref 38–126)
Anion gap: 9 (ref 5–15)
BUN: 40 mg/dL — ABNORMAL HIGH (ref 8–23)
CO2: 22 mmol/L (ref 22–32)
Calcium: 7.8 mg/dL — ABNORMAL LOW (ref 8.9–10.3)
Chloride: 108 mmol/L (ref 98–111)
Creatinine, Ser: 1.18 mg/dL (ref 0.61–1.24)
GFR, Estimated: >60 mL/min (ref >60)
Glucose, Bld: 171 mg/dL — ABNORMAL HIGH (ref 70–99)
Potassium: 4.2 mmol/L (ref 3.5–5.1)
Sodium: 139 mmol/L (ref 135–145)
Total Bilirubin: 2.2 mg/dL — ABNORMAL HIGH (ref 0.0–1.2)
Total Protein: 4.4 g/dL — ABNORMAL LOW (ref 6.5–8.1)

## 2024-03-19 LAB — TYPE AND SCREEN
ABO/RH(D): O POS
Antibody Screen: NEGATIVE
Unit division: 0
Unit division: 0
Unit division: 0
Unit division: 0
Unit division: 0
Unit division: 0
Unit division: 0
Unit division: 0
Unit division: 0
Unit division: 0

## 2024-03-19 LAB — PREPARE RBC (CROSSMATCH)

## 2024-03-19 LAB — CBC
HCT: 21.9 % — ABNORMAL LOW (ref 39.0–52.0)
HCT: 28.4 % — ABNORMAL LOW (ref 39.0–52.0)
Hemoglobin: 7.6 g/dL — ABNORMAL LOW (ref 13.0–17.0)
Hemoglobin: 9.8 g/dL — ABNORMAL LOW (ref 13.0–17.0)
MCH: 31.3 pg (ref 26.0–34.0)
MCH: 30.8 pg (ref 26.0–34.0)
MCHC: 34.7 g/dL (ref 30.0–36.0)
MCHC: 34.5 g/dL (ref 30.0–36.0)
MCV: 90.1 fL (ref 80.0–100.0)
MCV: 89.3 fL (ref 80.0–100.0)
Platelets: 101 K/uL — ABNORMAL LOW (ref 150–400)
Platelets: 67 K/uL — ABNORMAL LOW (ref 150–400)
RBC: 2.43 MIL/uL — ABNORMAL LOW (ref 4.22–5.81)
RBC: 3.18 MIL/uL — ABNORMAL LOW (ref 4.22–5.81)
RDW: 17.2 % — ABNORMAL HIGH (ref 11.5–15.5)
RDW: 16.5 % — ABNORMAL HIGH (ref 11.5–15.5)
WBC: 12.9 K/uL — ABNORMAL HIGH (ref 4.0–10.5)
WBC: 9.1 K/uL (ref 4.0–10.5)
nRBC: 0 % (ref 0.0–0.2)
nRBC: 0.3 % — ABNORMAL HIGH (ref 0.0–0.2)

## 2024-03-19 LAB — BPAM RBC
Blood Product Expiration Date: 202508112359
Blood Product Expiration Date: 202508112359
Blood Product Expiration Date: 202508282359
Blood Product Expiration Date: 202508282359
Blood Product Expiration Date: 202508292359
Blood Product Expiration Date: 202508292359
Blood Product Expiration Date: 202508302359
Blood Product Expiration Date: 202508302359
Blood Product Expiration Date: 202508302359
Blood Product Expiration Date: 202508302359
ISSUE DATE / TIME: 202508040729
ISSUE DATE / TIME: 202508040729
ISSUE DATE / TIME: 202508041150
ISSUE DATE / TIME: 202508041150
ISSUE DATE / TIME: 202508041330
ISSUE DATE / TIME: 202508041330
ISSUE DATE / TIME: 202508041330
ISSUE DATE / TIME: 202508050634
Unit Type and Rh: 5100
Unit Type and Rh: 5100
Unit Type and Rh: 5100
Unit Type and Rh: 5100
Unit Type and Rh: 5100
Unit Type and Rh: 5100
Unit Type and Rh: 5100
Unit Type and Rh: 5100
Unit Type and Rh: 5100
Unit Type and Rh: 5100

## 2024-03-19 LAB — GLUCOSE, CAPILLARY
Glucose-Capillary: 100 mg/dL — ABNORMAL HIGH (ref 70–99)
Glucose-Capillary: 113 mg/dL — ABNORMAL HIGH (ref 70–99)
Glucose-Capillary: 118 mg/dL — ABNORMAL HIGH (ref 70–99)
Glucose-Capillary: 125 mg/dL — ABNORMAL HIGH (ref 70–99)
Glucose-Capillary: 132 mg/dL — ABNORMAL HIGH (ref 70–99)
Glucose-Capillary: 137 mg/dL — ABNORMAL HIGH (ref 70–99)
Glucose-Capillary: 138 mg/dL — ABNORMAL HIGH (ref 70–99)
Glucose-Capillary: 142 mg/dL — ABNORMAL HIGH (ref 70–99)
Glucose-Capillary: 157 mg/dL — ABNORMAL HIGH (ref 70–99)
Glucose-Capillary: 168 mg/dL — ABNORMAL HIGH (ref 70–99)
Glucose-Capillary: 183 mg/dL — ABNORMAL HIGH (ref 70–99)
Glucose-Capillary: 203 mg/dL — ABNORMAL HIGH (ref 70–99)
Glucose-Capillary: 212 mg/dL — ABNORMAL HIGH (ref 70–99)

## 2024-03-19 LAB — PHOSPHORUS: Phosphorus: 4.4 mg/dL (ref 2.5–4.6)

## 2024-03-19 LAB — MAGNESIUM: Magnesium: 2.6 mg/dL — ABNORMAL HIGH (ref 1.7–2.4)

## 2024-03-19 LAB — BASIC METABOLIC PANEL WITH GFR
Anion gap: 10 (ref 5–15)
BUN: 34 mg/dL — ABNORMAL HIGH (ref 8–23)
CO2: 22 mmol/L (ref 22–32)
Calcium: 7.8 mg/dL — ABNORMAL LOW (ref 8.9–10.3)
Chloride: 107 mmol/L (ref 98–111)
Creatinine, Ser: 1.17 mg/dL (ref 0.61–1.24)
GFR, Estimated: >60 mL/min (ref >60)
Glucose, Bld: 161 mg/dL — ABNORMAL HIGH (ref 70–99)
Potassium: 4 mmol/L (ref 3.5–5.1)
Sodium: 139 mmol/L (ref 135–145)

## 2024-03-19 LAB — COOXEMETRY PANEL
Carboxyhemoglobin: 1 % (ref 0.5–1.5)
Carboxyhemoglobin: 1.8 % — ABNORMAL HIGH (ref 0.5–1.5)
Methemoglobin: <0.7 % (ref 0.0–1.5)
Methemoglobin: 0.8 % (ref 0.0–1.5)
O2 Saturation: 82.9 %
O2 Saturation: 54.8 %
Total hemoglobin: 8.9 g/dL — ABNORMAL LOW (ref 12.0–16.0)
Total hemoglobin: 9.7 g/dL — ABNORMAL LOW (ref 12.0–16.0)

## 2024-03-19 LAB — TRIGLYCERIDES: Triglycerides: 95 mg/dL (ref <150)

## 2024-03-19 LAB — APTT: aPTT: 35 s (ref 24–36)

## 2024-03-19 LAB — PROTIME-INR
INR: 1.5 — ABNORMAL HIGH (ref 0.8–1.2)
Prothrombin Time: 18.9 s — ABNORMAL HIGH (ref 11.4–15.2)

## 2024-03-19 LAB — FIBRINOGEN: Fibrinogen: 291 mg/dL (ref 210–475)

## 2024-03-19 LAB — SURGICAL PATHOLOGY

## 2024-03-19 MED ORDER — LEVETIRACETAM (KEPPRA) 500 MG/5 ML ADULT IV PUSH
1000.0000 mg | Freq: Two times a day (BID) | INTRAVENOUS | Status: DC
  Administered 2024-03-19 – 2024-04-01 (×32): 1000 mg via INTRAVENOUS
  Filled 2024-03-19 (×26): qty 10

## 2024-03-19 MED ORDER — LEVETIRACETAM (KEPPRA) 500 MG/5 ML ADULT IV PUSH
1000.0000 mg | INTRAVENOUS | Status: AC
  Administered 2024-03-19: 1000 mg via INTRAVENOUS
  Filled 2024-03-19: qty 10

## 2024-03-19 MED ORDER — SODIUM CHLORIDE 0.9% IV SOLUTION: Freq: Once | INTRAVENOUS | Status: AC

## 2024-03-19 MED ORDER — FUROSEMIDE 10 MG/ML IJ SOLN
40.0000 mg | Freq: Once | INTRAMUSCULAR | Status: AC
  Administered 2024-03-19: 40 mg via INTRAVENOUS
  Filled 2024-03-19: qty 4

## 2024-03-19 MED ORDER — INSULIN GLARGINE-YFGN 100 UNIT/ML ~~LOC~~ SOLN
24.0000 [IU] | Freq: Every day | SUBCUTANEOUS | Status: DC
  Administered 2024-03-19 – 2024-03-22 (×4): 24 [IU] via SUBCUTANEOUS
  Filled 2024-03-19 (×4): qty 0.24

## 2024-03-19 MED ORDER — INSULIN ASPART 100 UNIT/ML IJ SOLN
0.0000 [IU] | INTRAMUSCULAR | Status: DC
  Administered 2024-03-19: 8 [IU] via SUBCUTANEOUS
  Administered 2024-03-19: 4 [IU] via SUBCUTANEOUS
  Administered 2024-03-19: 2 [IU] via SUBCUTANEOUS
  Administered 2024-03-19: 8 [IU] via SUBCUTANEOUS
  Administered 2024-03-19: 2 [IU] via SUBCUTANEOUS
  Administered 2024-03-20: 4 [IU] via SUBCUTANEOUS
  Administered 2024-03-20 (×2): 2 [IU] via SUBCUTANEOUS
  Administered 2024-03-20: 4 [IU] via SUBCUTANEOUS
  Administered 2024-03-20 – 2024-03-26 (×28): 2 [IU] via SUBCUTANEOUS
  Administered 2024-03-26: 4 [IU] via SUBCUTANEOUS
  Administered 2024-03-26 (×2): 2 [IU] via SUBCUTANEOUS
  Administered 2024-03-26: 4 [IU] via SUBCUTANEOUS
  Administered 2024-03-27 – 2024-04-01 (×25): 2 [IU] via SUBCUTANEOUS

## 2024-03-19 NOTE — Progress Notes (Signed)
 NEUROLOGY CONSULT FOLLOW UP NOTE   Date of service: March 19, 2024 Patient Name: Alan [name] MRN:  [id number] DOB:  May 12, 1956  Interval Hx/subjective  Patient continues to require multiple pressors to maintain blood pressure within parameters.  He continues to be sedated with propofol .  No additional myoclonic jerking observed overnight.  Vitals   Vitals:   03/19/24 0630 03/19/24 0645 03/19/24 0700 03/19/24 0715  BP:      Pulse: 84 85 86 86  Resp: 16 16 14 16   Temp: 98.6 F (37 C) 98.8 F (37.1 C) 98.8 F (37.1 C) 98.8 F (37.1 C)  TempSrc:      SpO2: 99% 98% 98% 98%  Weight:      Height:         Body mass index is 22.57 kg/m.  Physical Exam   Constitutional: Ill-appearing, intubated patient in no acute distress Eyes: No scleral injection.  HENT: ET tube in place Head: Normocephalic.  Cardiovascular: Normal rate and regular rhythm.  Respiratory: Respirations synchronous with ventilator on Skin: Midsternal wound dressing in place  Neurologic Examination   (Sedated on propofol ) patient does not respond to name and is unable to follow commands, pupils equal round and reactive, oculocephalic reflex absent, corneal reflexes absent, no cough or gag, no response to noxious stimuli in all 4 extremities, no spontaneous movement  Medications  Current Facility-Administered Medications:    0.9 %  sodium chloride  infusion (Manually program via Guardrails IV Fluids), , Intravenous, Once, Kerrin Elspeth BROCKS, MD   0.9 %  sodium chloride  infusion (Manually program via Guardrails IV Fluids), , Intravenous, Once, Jenna Maude BRAVO, NP   acetaminophen  (TYLENOL ) tablet 1,000 mg, 1,000 mg, Oral, Q6H, 1,000 mg at 03/18/24 1208 **OR** acetaminophen  (TYLENOL ) 160 MG/5ML solution 1,000 mg, 1,000 mg, Per Tube, Q6H, Gold, Wayne E, PA-C, 1,000 mg at 03/18/24 1813   albumin  human 5 % solution 12.5 g, 250 mL, Intravenous, Q15 min PRN, Gold, Wayne E, PA-C, Stopped at 03/18/24 0335    [COMPLETED] amiodarone  (NEXTERONE ) 1.8 mg/mL load via infusion 150 mg, 150 mg, Intravenous, Once, 150 mg at 03/17/24 1621 **FOLLOWED BY** [EXPIRED] amiodarone  (NEXTERONE  PREMIX) 360-4.14 MG/200ML-% (1.8 mg/mL) IV infusion, 60 mg/hr, Intravenous, Continuous, Stopping previously hung infusion at 03/18/24 0700 **FOLLOWED BY** amiodarone  (NEXTERONE  PREMIX) 360-4.14 MG/200ML-% (1.8 mg/mL) IV infusion, 30 mg/hr, Intravenous, Continuous, Jenna Maude BRAVO, NP, Last Rate: 16.67 mL/hr at 03/19/24 0700, 30 mg/hr at 03/19/24 0700   aspirin  EC tablet 325 mg, 325 mg, Oral, Daily **OR** aspirin  chewable tablet 324 mg, 324 mg, Per Tube, Daily, Gold, Wayne E, PA-C, 324 mg at 03/18/24 9043   atorvastatin  (LIPITOR) tablet 80 mg, 80 mg, Per Tube, Daily, Salam, Savannah B, RPH   bisacodyl  (DULCOLAX) EC tablet 10 mg, 10 mg, Oral, Daily, 10 mg at 03/18/24 0956 **OR** bisacodyl  (DULCOLAX) suppository 10 mg, 10 mg, Rectal, Daily, Gold, Wayne E, PA-C   Chlorhexidine  Gluconate Cloth 2 % PADS 6 each, 6 each, Topical, Daily, Hendrickson, Steven C, MD, 6 each at 03/18/24 1000   Chlorhexidine  Gluconate Cloth 2 % PADS 6 each, 6 each, Topical, Daily, Kerrin Elspeth BROCKS, MD, 6 each at 03/18/24 1815   dextrose  50 % solution 0-50 mL, 0-50 mL, Intravenous, PRN, Gold, Wayne E, PA-C   docusate (COLACE) 50 MG/5ML liquid 100 mg, 100 mg, Per Tube, BID, Gretta Leita SQUIBB, DO, 100 mg at 03/18/24 2103   EPINEPHrine  (ADRENALIN ) 5 mg in NS 250 mL (0.02 mg/mL) premix infusion, 0-10 mcg/min, Intravenous, Titrated, Gold,  Lemond BRAVO, PA-C, Last Rate: 9 mL/hr at 03/19/24 0700, 3 mcg/min at 03/19/24 0700   feeding supplement (PROSource TF20) liquid 60 mL, 60 mL, Per Tube, BID, Gretta Leita SQUIBB, DO, 60 mL at 03/18/24 2103   feeding supplement (VITAL 1.5 CAL) liquid 1,000 mL, 1,000 mL, Per Tube, Continuous, Gretta Leita SQUIBB, DO, Last Rate: 55 mL/hr at 03/19/24 0700, Infusion Verify at 03/19/24 0700   fentaNYL  (SUBLIMAZE ) injection 50 mcg, 50 mcg, Intravenous, Q15  min PRN, Gretta Leita SQUIBB, DO, 50 mcg at 03/18/24 0940   fentaNYL  (SUBLIMAZE ) injection 50-200 mcg, 50-200 mcg, Intravenous, Q30 min PRN, Gretta Leita SQUIBB, DO   insulin  regular, human (MYXREDLIN ) 100 units/ 100 mL infusion, , Intravenous, Continuous, Gold, Wayne E, PA-C, Last Rate: 3.4 mL/hr at 03/19/24 0700, 3.4 Units/hr at 03/19/24 0700   ipratropium-albuterol  (DUONEB) 0.5-2.5 (3) MG/3ML nebulizer solution 3 mL, 3 mL, Nebulization, Q4H PRN, Gretta Leita SQUIBB, DO   levETIRAcetam  (KEPPRA ) undiluted injection 500 mg, 500 mg, Intravenous, Q12H, Ryann Pauli, MD, 500 mg at 03/18/24 1943   metoprolol  tartrate (LOPRESSOR ) tablet 12.5 mg, 12.5 mg, Oral, BID **OR** metoprolol  tartrate (LOPRESSOR ) 25 mg/10 mL oral suspension 12.5 mg, 12.5 mg, Per Tube, BID, Gold, Wayne E, PA-C   metoprolol  tartrate (LOPRESSOR ) injection 2.5-5 mg, 2.5-5 mg, Intravenous, Q2H PRN, Gold, Wayne E, PA-C   midazolam  (VERSED ) injection 2 mg, 2 mg, Intravenous, Q1H PRN, Gold, Wayne E, PA-C, 2 mg at 03/18/24 0805   milrinone  (PRIMACOR ) 20 MG/100 ML (0.2 mg/mL) infusion, 0.125 mcg/kg/min, Intravenous, Continuous, Kerrin Elspeth BROCKS, MD, Last Rate: 2.96 mL/hr at 03/19/24 0700, 0.125 mcg/kg/min at 03/19/24 0700   norepinephrine  (LEVOPHED ) 16 mg in (0.064 mg/mL) premix infusion, 0-40 mcg/min, Intravenous, Titrated, Kerrin Elspeth BROCKS, MD, Last Rate: 7.5 mL/hr at 03/19/24 0700, 8 mcg/min at 03/19/24 0700   ondansetron  (ZOFRAN ) injection 4 mg, 4 mg, Intravenous, Q6H PRN, Viviane Lemond BRAVO, PA-C   Oral care mouth rinse, 15 mL, Mouth Rinse, Q2H, Kerrin Elspeth BROCKS, MD, 15 mL at 03/19/24 0600   oxyCODONE  (Oxy IR/ROXICODONE ) immediate release tablet 5-10 mg, 5-10 mg, Per Tube, Q3H PRN, Emmette Fraction B, RPH, 10 mg at 03/18/24 2103   pantoprazole  (PROTONIX ) injection 40 mg, 40 mg, Intravenous, QHS, Salam, Savannah B, RPH, 40 mg at 03/18/24 2103   polyethylene glycol (MIRALAX  / GLYCOLAX ) packet 17 g, 17 g, Per Tube, Daily, Gretta Leita P, DO,  17 g at 03/18/24 0957   propofol  (DIPRIVAN ) 1000 MG/100ML infusion, 0-80 mcg/kg/min, Intravenous, Continuous, Gretta Leita SQUIBB, DO, Last Rate: 15.35 mL/hr at 03/19/24 0700, 30 mcg/kg/min at 03/19/24 0700   sodium chloride  flush (NS) 0.9 % injection 10-40 mL, 10-40 mL, Intracatheter, Q12H, Kerrin Elspeth BROCKS, MD, 20 mL at 03/18/24 2100   sodium chloride  flush (NS) 0.9 % injection 3 mL, 3 mL, Intravenous, Q12H, Gold, Wayne E, PA-C, 3 mL at 03/18/24 1000   sodium chloride  flush (NS) 0.9 % injection 3 mL, 3 mL, Intravenous, PRN, Viviane Lemond BRAVO, PA-C  Labs and Diagnostic Imaging   CBC:  Recent Labs  Lab 03/18/24 2047 03/19/24 0222  WBC 12.8* 12.9*  HGB 7.9* 7.6*  HCT 23.2* 21.9*  MCV 88.9 90.1  PLT 114* 101*    Basic Metabolic Panel:  Lab Results  Component Value Date   NA 139 03/19/2024   K 4.0 03/19/2024   CO2 22 03/19/2024   GLUCOSE 161 (H) 03/19/2024   BUN 34 (H) 03/19/2024   CREATININE 1.17 03/19/2024   CALCIUM  7.8 (L) 03/19/2024  GFRNONAA >60 03/19/2024   GFRAA >60 05/12/2020   Lipid Panel:  Lab Results  Component Value Date   LDLCALC 58 03/12/2024   HgbA1c:  Lab Results  Component Value Date   HGBA1C 5.0 03/12/2024   INR  Lab Results  Component Value Date   INR 1.5 (H) 03/19/2024   APTT  Lab Results  Component Value Date   APTT 35 03/19/2024   AED levels: No results found for: PHENYTOIN, ZONISAMIDE, LAMOTRIGINE, LEVETIRACETA  CT Head without contrast(Personally reviewed): No acute abnormality, atrophy and chronic small vessel ischemic disease  CT angio Head and Neck with contrast(Personally reviewed): Dissection of the aortic arch and descending thoracic aorta with edema surrounding the proximal left subclavian artery, mild irregular stenosis of M1 segments of bilateral MCAs, mild left pneumothorax and bilateral pleural effusions  MRI Brain(Personally reviewed): Pending  Continuous EEG:  This is an abnormal EEG due to the presence of the  following: 1) Generalized polymorphic delta slowing, suggesting severe encephalopathy, which is nonspecific as to etiology (medication effect of propofol  could not be ruled out); 2) 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   Assessment   RUFFUS KAMAKA is a 68 y.o. male with history of CAD, hypertension, hyperlipidemia, mitral valve prolapse status postrepair, SVT status post ablation, metastatic small cell lung cancer in remission who was originally admitted after a non-STEMI and underwent CABG which was complicated by intraoperative aortic dissection.  After sedation was held, he was noted to have a lot of twitching with LPD's on the EEG along with myoclonic status epilepticus-2 episodes.  Anoxic brain injury is suspected, and patient will need MRI tomorrow at the earliest.  However, he does have a Swan-Ganz catheter and external pacemaker wires, so these must be discontinued before MRI can take place.  Would continue sedation with propofol  for the time being and await today's EEG read.   Suspected anoxic brain injury  Recommendations  -Continue LTM EEG - Hold propofol .  Resume if clinical activity resumes - Continue Keppra  500 mg twice daily - Continue seizure precautions - Supportive care per cardiothoracic surgery team - MRI brain with Swan-Ganz and pacer wires discontinued, earliest on 8/8   Addendum Patient having myoclonic status intermittently off of propofol .  Recommended starting back propofol  but the wife is reluctant.  Will have conversation with the wife and advise after conversations.  ______________________________________________________________________ Patient seen by NP and then by MD, MD to edit the note as needed.   Signed, Cortney E Everitt Clint Kill, NP [facility]   Attending Neurohospitalist Addendum Patient seen and examined with APP/Resident. Agree with the history and physical as documented above. Agree with the plan as documented,  which I helped formulate. I have independently reviewed the chart, obtained history, review of systems and examined the patient.I have personally reviewed pertinent head/neck/spine imaging (CT/MRI).  Plan discussed with Dr. Gretta and patient's wife. Please feel free to call with any questions.  -- Eligio Lav, MD Neurologist [facility] Pager: [phone]    CRITICAL CARE ATTESTATION Performed by: Eligio Lav, MD Total critical care time: 35 minutes Critical care time was exclusive of separately billable procedures and treating other patients and/or supervising APPs/Residents/Students Critical care was necessary to treat or prevent imminent or life-threatening deterioration. This patient is critically ill and at significant risk for neurological worsening and/or death and care requires constant monitoring. Critical care was time spent personally by me on the following activities: development of treatment plan with patient and/or surrogate as well as  nursing, discussions with consultants, evaluation of patient's response to treatment, examination of patient, obtaining history from patient or surrogate, ordering and performing treatments and interventions, ordering and review of laboratory studies, ordering and review of radiographic studies, pulse oximetry, re-evaluation of patient's condition, participation in multidisciplinary rounds and medical decision making of high complexity in the care of this patient.

## 2024-03-19 NOTE — Plan of Care (Signed)
  Problem: Cardiovascular: Goal: Ability to achieve and maintain adequate cardiovascular perfusion will improve Outcome: Progressing Goal: Vascular access site(s) Level 0-1 will be maintained Outcome: Progressing   Problem: Clinical Measurements: Goal: Ability to maintain clinical measurements within normal limits will improve Outcome: Progressing Goal: Will remain free from infection Outcome: Progressing Goal: Diagnostic test results will improve Outcome: Progressing Goal: Cardiovascular complication will be avoided Outcome: Progressing   Problem: Nutrition: Goal: Adequate nutrition will be maintained Outcome: Progressing   Problem: Elimination: Goal: Will not experience complications related to urinary retention Outcome: Progressing   Problem: Pain Managment: Goal: General experience of comfort will improve and/or be controlled Outcome: Progressing   Problem: Safety: Goal: Ability to remain free from injury will improve Outcome: Progressing   Problem: Skin Integrity: Goal: Risk for impaired skin integrity will decrease Outcome: Progressing   Problem: Cardiac: Goal: Ability to achieve and maintain adequate cardiovascular perfusion will improve Outcome: Progressing   Problem: Skin Integrity: Goal: Wound healing without signs and symptoms of infection Outcome: Progressing Goal: Risk for impaired skin integrity will decrease Outcome: Progressing   Problem: Urinary Elimination: Goal: Ability to achieve and maintain adequate renal perfusion and functioning will improve Outcome: Progressing   Problem: Education: Goal: Understanding of CV disease, CV risk reduction, and recovery process will improve Outcome: Not Progressing Goal: Individualized Educational Video(s) Outcome: Not Progressing   Problem: Activity: Goal: Ability to return to baseline activity level will improve Outcome: Not Progressing   Problem: Health Behavior/Discharge Planning: Goal: Ability to  safely manage health-related needs after discharge will improve Outcome: Not Progressing   Problem: Education: Goal: Knowledge of General Education information will improve Description: Including pain rating scale, medication(s)/side effects and non-pharmacologic comfort measures Outcome: Not Progressing   Problem: Health Behavior/Discharge Planning: Goal: Ability to manage health-related needs will improve Outcome: Not Progressing   Problem: Clinical Measurements: Goal: Respiratory complications will improve Outcome: Not Progressing   Problem: Activity: Goal: Risk for activity intolerance will decrease Outcome: Not Progressing   Problem: Coping: Goal: Level of anxiety will decrease Outcome: Not Progressing   Problem: Elimination: Goal: Will not experience complications related to bowel motility Outcome: Not Progressing   Problem: Education: Goal: Understanding of cardiac disease, CV risk reduction, and recovery process will improve Outcome: Not Progressing Goal: Individualized Educational Video(s) Outcome: Not Progressing   Problem: Activity: Goal: Ability to tolerate increased activity will improve Outcome: Not Progressing   Problem: Health Behavior/Discharge Planning: Goal: Ability to safely manage health-related needs after discharge will improve Outcome: Not Progressing   Problem: Education: Goal: Will demonstrate proper wound care and an understanding of methods to prevent future damage Outcome: Not Progressing Goal: Knowledge of disease or condition will improve Outcome: Not Progressing Goal: Knowledge of the prescribed therapeutic regimen will improve Outcome: Not Progressing Goal: Individualized Educational Video(s) Outcome: Not Progressing   Problem: Activity: Goal: Risk for activity intolerance will decrease Outcome: Not Progressing   Problem: Cardiac: Goal: Will achieve and/or maintain hemodynamic stability Outcome: Not Progressing   Problem:  Clinical Measurements: Goal: Postoperative complications will be avoided or minimized Outcome: Not Progressing   Problem: Respiratory: Goal: Respiratory status will improve Outcome: Not Progressing

## 2024-03-19 NOTE — Progress Notes (Addendum)
 Advanced Heart Failure Rounding Note  Cardiologist: [name], MD   Chief Complaint: Post-op CABG and Bentall procedure   Subjective:    POD #2  Remains on 0.125 milrinone  + 7 NE.  Swan #s CO 7.3 CI 3.5 PA 31/15 (22) CVP 14  Intubated and sedated w/ propofol  d/t myoclonus.  Hgb 7.6. Received 2 u RBCs.  Continues with high chest tube outptut.  Objective:   Weight Range: 84.1 kg Body mass index is 22.57 kg/m.   Vital Signs:   Temp:  [96.1 F (35.6 C)-99.3 F (37.4 C)] 98.8 F (37.1 C) (08/06 0645) Pulse Rate:  [71-107] 85 (08/06 0645) Resp:  [11-29] 16 (08/06 0645) BP: (103-147)/(39-55) 111/55 (08/06 0400) SpO2:  [77 %-100 %] 98 % (08/06 0645) Arterial Line BP: (62-182)/(15-115) 122/46 (08/06 0645) FiO2 (%):  [40 %-50 %] 40 % (08/06 0400) Weight:  [84.1 kg] 84.1 kg (08/06 0500) Last BM Date : 03/15/24  Weight change: Filed Weights   03/11/24 0141 03/18/24 0500 03/19/24 0500  Weight: 79 kg 85.3 kg 84.1 kg    Intake/Output:   Intake/Output Summary (Last 24 hours) at 03/19/2024 0659 Last data filed at 03/19/2024 0615 Gross per 24 hour  Intake 3544.96 ml  Output 2760 ml  Net 784.96 ml      Physical Exam    General:  Critically ill appearing HEENT: + ETT Cor: JVP 12-14. Regular rate & rhythm. No murmur. Lungs: + chest tubes, clear anteriorly Abdomen: soft, nondistended Extremities: 1-2+ edema Neuro: Sedated. No myoclonus observed on propofol .    Telemetry   SR/ST 90s-100s  Labs    CBC Recent Labs    03/18/24 2047 03/19/24 0222  WBC 12.8* 12.9*  HGB 7.9* 7.6*  HCT 23.2* 21.9*  MCV 88.9 90.1  PLT 114* 101*   Basic Metabolic Panel Recent Labs    91/94/74 1130 03/18/24 1641 03/19/24 0226  NA 141 141 139  K 4.5 4.2 4.0  CL 109 108 107  CO2 22 23 22   GLUCOSE 133* 119* 161*  BUN 18 24* 34*  CREATININE 1.06 1.19 1.17  CALCIUM  8.0* 7.7* 7.8*  MG 2.6* 2.5* 2.6*  PHOS 4.7*  --  4.4   Liver Function Tests No results for  input(s): AST, ALT, ALKPHOS, BILITOT, PROT, ALBUMIN  in the last 72 hours. No results for input(s): LIPASE, AMYLASE in the last 72 hours. Cardiac Enzymes No results for input(s): CKTOTAL, CKMB, CKMBINDEX, TROPONINI in the last 72 hours.  BNP: BNP (last 3 results) No results for input(s): BNP in the last 8760 hours.  ProBNP (last 3 results) No results for input(s): PROBNP in the last 8760 hours.   D-Dimer No results for input(s): DDIMER in the last 72 hours. Hemoglobin A1C No results for input(s): HGBA1C in the last 72 hours. Fasting Lipid Panel Recent Labs    03/19/24 0600  TRIG 95   Thyroid  Function Tests No results for input(s): TSH, T4TOTAL, T3FREE, THYROIDAB in the last 72 hours.  Invalid input(s): FREET3  Other results:   Imaging    CT ANGIO HEAD NECK W WO CM Result Date: 03/18/2024 EXAM: CTA HEAD AND NECK WITH AND WITHOUT 03/18/2024 09:40:10 AM TECHNIQUE: CTA of the head and neck was performed with and without the administration of intravenous contrast. Multiplanar 2D and/or 3D reformatted images are provided for review. Automated exposure control, iterative reconstruction, and/or weight based adjustment of the mA/kV was utilized to reduce the radiation dose to as low as reasonably achievable. Stenosis of the  internal carotid arteries measured using NASCET criteria. COMPARISON: CT of the head dated 03/18/2024. CLINICAL HISTORY: Neuro deficit, acute, stroke suspected. 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 The dissection appears to start near the takeoff of the brachiocephalic artery. Edema surrounds the proximal left subclavian artery, but there is no luminal stenosis. The origins of the brachiocephalic artery and left common carotid artery are widely patent. CERVICAL CAROTID ARTERIES: The common carotid  arteries are normal in caliber bilaterally. There is mild calcific plaque within the carotid bulbs, but no associated stenosis. There is mild calcific plaque present posteriorly within the origin of the right internal carotid artery, but no luminal stenosis. CERVICAL VERTEBRAL ARTERIES: The vertebral arteries are widely patent throughout their respective courses. LUNGS AND MEDIASTINUM: There is dependent atelectasis present within the upper lobes bilaterally. There is a mild left pneumothorax. There are bilateral pleural effusions, moderate on the right and mild on the left. An endotracheal tube and enteric tube are present. SOFT TISSUES: Postsurgical changes are present from recent surgery including sternotomy wires. BONES: No acute abnormality. CTA HEAD: ANTERIOR CIRCULATION: The cranial and cavernous segments of the internal carotid arteries are normal in caliber. There is mild irregular stenosis of the M1 segments of the middle cerebral arteries bilaterally. POSTERIOR CIRCULATION: There is mild stenosis of the T1 segments of the posterior cerebral arteries bilaterally. OTHER: No dural venous sinus thrombosis on this non-dedicated study. IMPRESSION: 1. 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 The entire arch is not included on the study. The cardiothoracic service is aware. 2. Mild irregular stenosis of the M1 segments of the middle cerebral arteries bilaterally and mild stenosis of the T1 segments of the posterior cerebral arteries bilaterally. 3. Mild left pneumothorax and bilateral pleural effusions, moderate on the right and mild on the left. Electronically signed by: evalene coho 03/18/2024 10:55 AM EDT RP Workstation: [id number]   CT HEAD WO CONTRAST ( ) Result Date: 03/18/2024 EXAM: CT HEAD WITHOUT 03/18/2024 08:12:08 AM TECHNIQUE: CT of the head was performed without the administration of  intravenous contrast. Automated exposure control, iterative reconstruction, and/or weight based adjustment of the mA/kV was utilized to reduce the radiation dose to as low as reasonably achievable. COMPARISON: None available. CLINICAL HISTORY: Seizure, new-onset, no history of trauma. FINDINGS: BRAIN AND VENTRICLES: No acute intracranial hemorrhage. No mass effect or midline shift. No extra-axial fluid collection. Gray-white differentiation is maintained. No hydrocephalus. There is a moderate amount of generalized cerebral volume loss present. There is mild-to-moderate periventricular and deep cerebral white matter disease. ORBITS: No acute abnormality. SINUSES AND MASTOIDS: There is mild mucosal disease within the floors of the maxillary sinuses. SOFT TISSUES AND SKULL: No acute skull fracture. No acute soft tissue abnormality. IMPRESSION: 1. No acute intracranial abnormality. 2. Moderate amount of generalized cerebral volume loss. 3. Mild-to-moderate periventricular and deep cerebral white matter disease. 4. Mild mucosal disease within the floors of the maxillary sinuses. Electronically signed by: evalene coho 03/18/2024 08:23 AM EDT RP Workstation: [id number]     Medications:     Scheduled Medications:  sodium chloride    Intravenous Once   sodium chloride    Intravenous Once   acetaminophen   1,000 mg Oral Q6H   Or   acetaminophen  (TYLENOL ) oral liquid 160 mg/5 mL  1,000 mg Per Tube Q6H  aspirin  EC  325 mg Oral Daily   Or   aspirin   324 mg Per Tube Daily   atorvastatin   80 mg Per Tube Daily   bisacodyl   10 mg Oral Daily   Or   bisacodyl   10 mg Rectal Daily   Chlorhexidine  Gluconate Cloth  6 each Topical Daily   Chlorhexidine  Gluconate Cloth  6 each Topical Daily   docusate  100 mg Per Tube BID   feeding supplement (PROSource TF20)  60 mL Per Tube BID   levETIRAcetam   500 mg Intravenous Q12H   metoprolol  tartrate  12.5 mg Oral BID   Or   metoprolol  tartrate  12.5 mg Per Tube BID    mouth rinse  15 mL Mouth Rinse Q2H   pantoprazole  (PROTONIX ) IV  40 mg Intravenous QHS   polyethylene glycol  17 g Per Tube Daily   sodium chloride  flush  10-40 mL Intracatheter Q12H   sodium chloride  flush  3 mL Intravenous Q12H    Infusions:  albumin  human Stopped (03/18/24 0335)   amiodarone  30 mg/hr (03/19/24 0600)   dexmedetomidine  (PRECEDEX ) IV infusion Stopped (03/18/24 1325)   epinephrine  3 mcg/min (03/19/24 0600)   feeding supplement (VITAL 1.5 CAL) 55 mL/hr at 03/19/24 0600   insulin  3.4 Units/hr (03/19/24 0600)   milrinone  0.125 mcg/kg/min (03/19/24 0600)   norepinephrine  (LEVOPHED ) Adult infusion 9 mcg/min (03/19/24 0600)   propofol  (DIPRIVAN ) infusion 30 mcg/kg/min (03/19/24 0658)    PRN Medications: albumin  human, dextrose , fentaNYL  (SUBLIMAZE ) injection, fentaNYL  (SUBLIMAZE ) injection, ipratropium-albuterol , metoprolol  tartrate, midazolam , ondansetron  (ZOFRAN ) IV, oxyCODONE , sodium chloride  flush    Patient Profile  68 y.o. male with history of CAD, severe MR s/p minimally invasive mitral valve repair, stage III NSCLC.   Patient admitted with NSTEMI and new systolic CHF. Underwent 3v CABG X 3 c/b ascending aortic dissection.  Assessment/Plan   1. NSTEMI with known CAD -Remote anterior STEMI with prior stents to LAD -NSTEMI - 3 V CAD on cath with 99% in-stent restenosis in LAD, 50% residual stenosis post PTCA -CABG X 3 (LIMA to LAD, SVG to OM, SBVG to PDA) 03/17/24 as above -Aspirin  + statin.    2. Acute systolic CHF/ICM -Newly reduced EF -Echo 03/10/24: EF 30-35%, anterior WMA, RV mildly reduced -TD CO 3.5 and Fick CI 2.8 on 0.125 milrinone  + 7 NE. Stop milrinone . Remove Swan and keep introducer for now. Avoid hypotension. Use NE as needed. Aim for MAP > 70, although does have wide pulse pressure.   3. Severe MR s/p minimally invasive mitral valve repair -trivial MR with mean gradient of 4 mmhg on echo this admit   4. Postop  anemia/thrombocytopenia -Multiple blood products in OR and immediately post-op -Hgb 7.6 this am >> 2 u RBCs given -Platelets stable 100K  5. NSVT -No recurrence -Continue amiodarone  gtt while on inotropes/pressors   6. NSCLC w/bone mets -S/p chemoradiation and immunotherapy -Stable disease on most recent outpatinet imaging. Followed by Heme/Onc -Possible new sclerotic lesion/small LUL nodule on CXR this admission. Will eventually require CT.  6. 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 Left pneumothorax - CCM monitoring  CRITICAL CARE Performed by: COLLETTA SHAVER N   Total critical care time: 20 minutes  Critical care time was exclusive of separately billable procedures and treating other patients.  Critical care was necessary to treat or prevent imminent or life-threatening deterioration.  Critical care was  time spent personally by me on the following activities: development of treatment plan with patient and/or surrogate as well as nursing, discussions with consultants, evaluation of patient's response to treatment, examination of patient, obtaining history from patient or surrogate, ordering and performing treatments and interventions, ordering and review of laboratory studies, ordering and review of radiographic studies, pulse oximetry and re-evaluation of patient's condition.   Length of Stay: [address], LINDSAY N, PA-C  03/19/2024, 6:59 AM  Advanced Heart Failure Team Pager [phone] (M-F; 7a - 5p)  Please contact [facility] for night-coverage after hours (5p -7a ) and weekends on [email or web address]  Agree with above.   Remains intubated/sedated. on NE and milrinone . 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 RIJ swan  Rnm:Duzmwjo dressing ok Regular rate &  rhythm.  Lungs: clear Abdomen: soft, nontender, nondistended. hypoactive bowel sounds. Extremities: no cyanosis, clubbing, rash, tr edema Neuro: sedated  Hemodynamics stable on milrinone  and NE. Main concern remains his neuro status. ECG results concerning for severe anoxic injury. Neuro following closely and will try to wean sedation.   Can wean milrinone  and use NE to support co-ox > 55% and MAP >= 70. Can get swan out to permit brain MRI  CRITICAL CARE Performed by: Cherrie Sieving  Total critical care time: 45 minutes  Critical care time was exclusive of separately billable procedures and treating other patients.  Critical care was necessary to treat or prevent imminent or life-threatening deterioration.  Critical care was time spent personally by me (independent of midlevel providers or residents) on the following activities: development of treatment plan with patient and/or surrogate as well as nursing, discussions with consultants, evaluation of patient's response to treatment, examination of patient, obtaining history from patient or surrogate, ordering and performing treatments and interventions, ordering and review of laboratory studies, ordering and review of radiographic studies, pulse oximetry and re-evaluation of patient's condition.  Sieving Cherrie, MD  4:52 PM

## 2024-03-19 NOTE — Progress Notes (Signed)
 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  Admitted 7/28 w/ chest pain. Initially started 7/26 went to ER and CEs and EKG neg so went home but CP persisted so returned.  In ER on 28th + trop I, new T wave inversion in anterior lateral leads.  Treated w/ supplemental oxygen, morphine , started on heparin  gtt.  Went to cath lab  Findings:  99% -80% in-stent restenosis of LAD balloon angioplasty completed w/ partial restoration of flow (this re-stenosed stent was felt culprit lesion). RCA 45 to 70% stenosed, cx was 40%. Had nml LVEDP, no AS, referred to CVTS for CABG given 3V disease. and placed on plavix . EF from ECHO 30-35% w/ left regional wall motion abnormality.   Went to OR 8/4 for planned CABG x 3, as well as repair of ascending aortic dissection w/ vascular graft and repair of aortic valve  Time on bypass: ~ 4h73min EBL 1390 Cell saver 910 Received: FFP X2, PLTs 1 unit PRBC and cryo for on going surgical oozing Arrived in ICU w 480 ml bloody outpt from Chest tube Surgical team at bedside on arrival  Shortly after arrival to ICU had runs of VT and AF w/ RVR. Amiodarone  bolus and gtt initiated.  Got 2 gms calcium  another 2 units PLTs. TXA  Pertinent  Medical History  Prior CAD w/ DES X 2 and prior minimally invasive MVR 2015, NSCLC q/ mets to bone (2023) s/p chemo and XRT w/ most recent scans showing stable non-active disease  NSTEMI, HLD Significant Hospital Events: Including procedures, antibiotic start and stop dates in addition to other pertinent events   7/28 admitted w/ CP and acute inf/lat MI, cardiac cath svr 3V disease w/ re-stenosed LAD stent and  incomplete restoration of coronary flow. EF 30-35% referred for CABG 8/4 to OR. CABG x 3 v but as completing CABG noted blood loss and intra-op ECHO showed dissection of Aorta. So underwent AV graft placement and AVR. Large EBL in OR received several blood products. Immediate post op w/ large vol CT output, runs of VT and SVT. Got 2gms calcium , amiodarone  and placed on overdrive pacing via pacer critical care at bedside assisting w/ support  8/5 myoclonus; normal head CT& CTA, EEG started. Loaded with keppra .   Interim History / Subjective:  Overnight 1.5L ches tube output during bathing/ rolling. 2 units pRBC overnight per TCTS. Remains on NE 7 and milrinone  0.160mcg.  Off epi. On propofol  30.  Objective    Blood pressure (!) 111/55, pulse 86, temperature 98.8 F (37.1 C), resp. rate 16, height 6' 4 (1.93 m), weight 84.1 kg, SpO2 98%. PAP: (12-42)/(4-21) 34/17 CVP:  [1 mmHg-17 mmHg] 13 mmHg CO:  [4.7 L/min-8.2 L/min] 6.9 L/min CI:  [2.3 L/min/m2-3.9 L/min/m2] 3.31 L/min/m2  Vent Mode: SIMV;PRVC;PSV FiO2 (%):  [40 %-50 %] 40 % Set Rate:  [16 bmp] 16 bmp Vt Set:  [620 mL] 620 mL PEEP:  [5 cmH20] 5 cmH20 Pressure Support:  [10 cmH20] 10 cmH20 Plateau Pressure:  [16 cmH20-17 cmH20] 17 cmH20   Intake/Output Summary (Last 24 hours) at 03/19/2024 9277 Last data filed at 03/19/2024 0700 Gross per  24 hour  Intake 3613.01 ml  Output 2720 ml  Net 893.01 ml   Filed Weights   03/11/24 0141 03/18/24 0500 03/19/24 0500  Weight: 79 kg 85.3 kg 84.1 kg    Examination: General: critically ill appearing man lying in bed in NAD  HENT: [place]/AT, eyes anicteric. EEG leads in place. Lungs: intermittent air leak from chest tubes, CTAB. Mild bloody output from ETT. Cardiovascular: S1S2, RRR Abdomen: soft, NT Extremities: +edema, some bruising. Neuro: no myoclonus on propofol  GU: clear yellow urine  BUN 34 Cr 1.17 WBC 12.9 H/H 7.6/21.9 Platelets 101 Coox 83%  CXR personally reviewed>ETT 5 cm  above carina, enlarging left pneumothorax. Chest tubes in place.     Resolved problem list   Assessment and Plan   3V CAD and restenosis of DES now s/p CABG x 3 V w/ intraoperative finding of ascending aorta dissection requiring aortic graft and AVR Acute on chronic HFrEF due to ICM with cardiogenic shock. H/o HLD, HLD and CAD w/ severe mitral valve disease. Prior stents and mini- mitral valve repair (2015) for severe MR. 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    Post operative acute blood loss w/ consumptive coagulopathy & thrombocytopenia following prolonged CPB time  hypocalcemia  -stable -transfuse for Hb <7 or hemodynamically significant bleeding -monitor  Post-operative vent management  -LTVV -VAP prevention protocol -PAD protocol for sedation -daily SAT & SBT once appropriate-- not taking off propofol  today per neuro  Small left pneumothorax > enlarging slightly, residual right hemothorax> resolved -con't chest tubes to suction -not planning for repeat chest tube on left unless significantly enlarged or ventilation issues -watch to make sure left pleural tube doesn't clot  H/o RLL stage IV lung adenocarcinoma w/ bone mets. Last PET w/ no active disease- in remission since 2023. Possible new sclerotic lesion on CXR with small LUL nodule. -CT  eventually needed for further evaluation   Hyperglycemia; A1c 5.0 -transition to basal bolus - semglee  24 units -SSI PRN -goal BG 140-180  At risk for malnutrition -con't TF  Wife updated by all teams yesterday. Will update her again today.  Best Practice (right click and Reselect all SmartList Selections daily)   Diet/type: tubefeeds DVT prophylaxis SCD Pressure ulcer(s): N/A GI prophylaxis: PPI Lines: Central line, Arterial Line, and yes and it is still needed Foley:  Yes, and it is still needed Code Status:  full code Last date of multidisciplinary goals of care discussion [per primary ]  Labs   CBC: Recent Labs  Lab 03/18/24 0200 03/18/24 1130 03/18/24 1641 03/18/24 2047 03/19/24 0222  WBC 13.9* 9.4 11.4* 12.8* 12.9*  HGB 9.1* 8.0* 7.5* 7.9* 7.6*  HCT 26.4* 22.7* 21.8* 23.2* 21.9*  MCV 87.4 86.6 87.9 88.9 90.1  PLT 160 102* 100* 114* 101*    Basic Metabolic Panel: Recent Labs  Lab 03/17/24 2200 03/18/24 0500 03/18/24 1130 03/18/24 1641 03/19/24 0226  NA 143 143 141 141 139  K 3.6 3.9 4.5 4.2  4.0  CL 112* 111 109 108 107  CO2 21* 21* 22 23 22   GLUCOSE 170* 136* 133* 119* 161*  BUN 12 18 18  24* 34*  CREATININE 0.93 1.07 1.06 1.19 1.17  CALCIUM  7.8* 7.9* 8.0* 7.7* 7.8*  MG 2.7* 2.5* 2.6* 2.5* 2.6*  PHOS  --   --  4.7*  --  4.4    ABG    Component Value Date/Time   PHART 7.409 03/17/2024 2158   PCO2ART 32.7 03/17/2024 2158   PO2ART 80 (L) 03/17/2024 2158   HCO3 20.7 03/17/2024 2158   TCO2 22 03/17/2024 2158   ACIDBASEDEF 3.0 (H) 03/17/2024 2158   O2SAT 82.9 03/19/2024 0600     Coagulation Profile: Recent Labs  Lab 03/16/24 0410 03/17/24 1446 03/17/24 1615 03/19/24 0222  INR 1.0 2.3* 1.8* 1.5*     Critical care time:    This patient is critically ill with multiple organ system failure which requires frequent high complexity decision making, assessment, support, evaluation, and titration of therapies. This was completed through the  application of advanced monitoring technologies and extensive interpretation of multiple databases. During this encounter critical care time was devoted to patient care services described in this note for 40 minutes.   Leita SHAUNNA Gaskins, DO 03/19/24 9:11 AM [facility]  For contact information, see Amion. If no response to pager, please call PCCM consult pager. After hours, 7PM- 7AM, please call Elink.

## 2024-03-19 NOTE — Progress Notes (Signed)
 2 Days Post-Op Procedure(s) (LRB): CORONARY ARTERY BYPASS GRAFTING TIMES THREE , USING LEFT INTERNAL MAMMARY ARTERY AND ENDOSCOPIC HARVESTED RIGHT SAPHENOUS VEIN (N/A) ECHOCARDIOGRAM, TRANSESOPHAGEAL, INTRAOPERATIVE (N/A) REPAIR, AORTIC DISSECTION, ASCENDING USING 30 MM HEMASHIELD PLATINUM VASCULAR GRAFT RESUSPENSION OF AORTIC VALVE Subjective: Intubated, sedated  Objective: Vital signs in last 24 hours: Temp:  [96.1 F (35.6 C)-99.3 F (37.4 C)] 98.8 F (37.1 C) (08/06 0715) Pulse Rate:  [71-107] 86 (08/06 0715) Cardiac Rhythm: Normal sinus rhythm (08/05 2000) Resp:  [11-29] 16 (08/06 0715) BP: (103-147)/(39-55) 111/55 (08/06 0400) SpO2:  [77 %-100 %] 98 % (08/06 0715) Arterial Line BP: (62-182)/(15-115) 123/47 (08/06 0715) FiO2 (%):  [40 %-50 %] 40 % (08/06 0400) Weight:  [84.1 kg] 84.1 kg (08/06 0500)  Hemodynamic parameters for last 24 hours: PAP: (12-42)/(4-21) 34/17 CVP:  [1 mmHg-17 mmHg] 13 mmHg CO:  [4.7 L/min-8.2 L/min] 6.9 L/min CI:  [2.3 L/min/m2-3.9 L/min/m2] 3.31 L/min/m2  Intake/Output from previous day: 08/05 0701 - 08/06 0700 In: 3613 [I.V.:1864.3; Blood:425.8; NG/GT:1078.8; IV Piggyback:244] Out: 2720 [Urine:770; Emesis/NG output:300; Chest Tube:1650] Intake/Output this shift: No intake/output data recorded.  Neurologic: sedated Heart: regular rate and rhythm Lungs: clear to auscultation bilaterally Abdomen: soft  Lab Results: Recent Labs    03/18/24 2047 03/19/24 0222  WBC 12.8* 12.9*  HGB 7.9* 7.6*  HCT 23.2* 21.9*  PLT 114* 101*   BMET:  Recent Labs    03/18/24 1641 03/19/24 0226  NA 141 139  K 4.2 4.0  CL 108 107  CO2 23 22  GLUCOSE 119* 161*  BUN 24* 34*  CREATININE 1.19 1.17  CALCIUM  7.7* 7.8*    PT/INR:  Recent Labs    03/19/24 0222  LABPROT 18.9*  INR 1.5*   ABG    Component Value Date/Time   PHART 7.409 03/17/2024 2158   HCO3 20.7 03/17/2024 2158   TCO2 22 03/17/2024 2158   ACIDBASEDEF 3.0 (H) 03/17/2024 2158    O2SAT 82.9 03/19/2024 0600   CBG (last 3)  Recent Labs    03/19/24 0411 03/19/24 0558 03/19/24 0801  GLUCAP 113* 137* 118*    Assessment/Plan: S/P Procedure(s) (LRB): CORONARY ARTERY BYPASS GRAFTING TIMES THREE , USING LEFT INTERNAL MAMMARY ARTERY AND ENDOSCOPIC HARVESTED RIGHT SAPHENOUS VEIN (N/A) ECHOCARDIOGRAM, TRANSESOPHAGEAL, INTRAOPERATIVE (N/A) REPAIR, AORTIC DISSECTION, ASCENDING USING 30 MM HEMASHIELD PLATINUM VASCULAR GRAFT RESUSPENSION OF AORTIC VALVE POD # 2 NEURO- seen by Neurology this AM  Seizure activity controlled with sedation-EEG to be reviewed  Concern for possible anoxic brain injury- MR tomorrow  CV- in Sr with high cardiac output, co-ox 82%  Will d/w AHF- but likely can dc milrinone , wean other drips  Liberalize BP goals  Dc pacing wires  Brief episode of VT postop- resolved spontaneously,   No recurrence on amiodarone  RESP- CXR shows resolution of right pleural effusion  Left pneumothorax larger, but not clinically significant at this time  Has a left pleural drain in place  If any issues with vent pressures will place pigtail  Vent per CCM RENAL- creatinine and lytes OK  Will start diuresis today ENDO- CBG controlled with drip  Transition to Sem Glee + SSI GI- will need to address  Anemia- Hgb 7.6- given 2 units of PRBC overnight Chest tubes > 1000 ml overnight with turning- right pleural effusion resolved  No evidence of ongoing bleeding  Keep CT in place for now  LOS: 9 days    Elspeth JAYSON Millers 03/19/2024

## 2024-03-19 NOTE — Progress Notes (Signed)
      [address]       [place] [address]             [phone]       POD # 2  CABG, repair type 1 aortic dissection  Intubated, sedated  BP (!) 140/52   Pulse (!) 108   Temp (!) 100.6 F (38.1 C)   Resp 20   Ht 6' 4 (1.93 m)   Wt 84.1 kg   SpO2 91%   BMI 22.57 kg/m    Intake/Output Summary (Last 24 hours) at 03/19/2024 1825 Last data filed at 03/19/2024 1800 Gross per 24 hour  Intake 3630.31 ml  Output 2905 ml  Net 725.31 ml   Sedation being lightened to assess neuro status MR in AM  Sasan Wilkie C. Kerrin, MD [facility] [phone]

## 2024-03-19 NOTE — Plan of Care (Addendum)
 Same-day progress note  Patient seen and examined again Has been off of propofol  since about 10:30 AM this morning He has had some concerning movements with twitching concerning for myoclonus. 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 His family would like him to be off of sedation is much as possible. To prevent further seizure episodes, I would up the dose of his Keppra  for right now after an additional 1 g dose. Okay to hold sedation unless he goes into florid status myoclonus.  Repeat exam around 3:45 PM: I spontaneously open, pupils equal round reactive light, no gaze preference or deviation, corneal reflexes present, cough and gag present.  To noxious stimulation in the left trap/chest, he starts moving his right arm.  Less movement of the left arm on noxious stimulation either on the right or left chest.  Noxious simulation in both lower extremities leads to triple flexion with pressure to the day of bed.  Pressure to the inner thigh does not really reveal withdrawal.  I had a detailed discussion with the wife, explaining my current findings and the fact that he is not currently in florid myoclonic status but his eventual neurological outcome still remains to be seen.  I am not discounting some improvement in the exam but I am not hanging my hat on this to mean that he will have a good neurological recovery.  I will continue to follow with you  Plan was relayed to Dr. Kerrin and Dr. Gretta   -- Alan Lav, MD Neurologist [facility]  25 min of additional CC time

## 2024-03-19 NOTE — Progress Notes (Signed)
 D/w Neuro, if he has worse myoclonus overnight would increase keppra  to 1500mg  BID, add depakote. Can add back propofol .  Leita SHAUNNA Gaskins, DO 03/19/24 6:23 PM [facility]  For contact information, see Amion. If no response to pager, please call PCCM consult pager. After hours, 7PM- 7AM, please call Elink.

## 2024-03-20 ENCOUNTER — Inpatient Hospital Stay (HOSPITAL_COMMUNITY)

## 2024-03-20 DIAGNOSIS — I6340 Cerebral infarction due to embolism of unspecified cerebral artery: Secondary | ICD-10-CM | POA: Diagnosis not present

## 2024-03-20 DIAGNOSIS — I214 Non-ST elevation (NSTEMI) myocardial infarction: Secondary | ICD-10-CM | POA: Diagnosis not present

## 2024-03-20 DIAGNOSIS — I5021 Acute systolic (congestive) heart failure: Secondary | ICD-10-CM

## 2024-03-20 DIAGNOSIS — G9340 Encephalopathy, unspecified: Secondary | ICD-10-CM

## 2024-03-20 DIAGNOSIS — I639 Cerebral infarction, unspecified: Secondary | ICD-10-CM

## 2024-03-20 DIAGNOSIS — G931 Anoxic brain damage, not elsewhere classified: Secondary | ICD-10-CM | POA: Diagnosis not present

## 2024-03-20 DIAGNOSIS — R569 Unspecified convulsions: Secondary | ICD-10-CM

## 2024-03-20 DIAGNOSIS — J939 Pneumothorax, unspecified: Secondary | ICD-10-CM | POA: Diagnosis not present

## 2024-03-20 DIAGNOSIS — R570 Cardiogenic shock: Secondary | ICD-10-CM | POA: Diagnosis not present

## 2024-03-20 DIAGNOSIS — I7100 Dissection of unspecified site of aorta: Secondary | ICD-10-CM | POA: Diagnosis not present

## 2024-03-20 LAB — COOXEMETRY PANEL
Carboxyhemoglobin: 1.9 % — ABNORMAL HIGH (ref 0.5–1.5)
Carboxyhemoglobin: 1.4 % (ref 0.5–1.5)
Methemoglobin: <0.7 % (ref 0.0–1.5)
Methemoglobin: <0.7 % (ref 0.0–1.5)
O2 Saturation: 56 %
O2 Saturation: 52.5 %
Total hemoglobin: 10.3 g/dL — ABNORMAL LOW (ref 12.0–16.0)
Total hemoglobin: 10.2 g/dL — ABNORMAL LOW (ref 12.0–16.0)

## 2024-03-20 LAB — CBC
HCT: 30.5 % — ABNORMAL LOW (ref 39.0–52.0)
Hemoglobin: 9.9 g/dL — ABNORMAL LOW (ref 13.0–17.0)
MCH: 29.9 pg (ref 26.0–34.0)
MCHC: 32.5 g/dL (ref 30.0–36.0)
MCV: 92.1 fL (ref 80.0–100.0)
Platelets: 48 K/uL — ABNORMAL LOW (ref 150–400)
RBC: 3.31 MIL/uL — ABNORMAL LOW (ref 4.22–5.81)
RDW: 17.2 % — ABNORMAL HIGH (ref 11.5–15.5)
WBC: 8.4 K/uL (ref 4.0–10.5)
nRBC: 2 % — ABNORMAL HIGH (ref 0.0–0.2)

## 2024-03-20 LAB — GLUCOSE, CAPILLARY
Glucose-Capillary: 169 mg/dL — ABNORMAL HIGH (ref 70–99)
Glucose-Capillary: 172 mg/dL — ABNORMAL HIGH (ref 70–99)
Glucose-Capillary: 123 mg/dL — ABNORMAL HIGH (ref 70–99)
Glucose-Capillary: 128 mg/dL — ABNORMAL HIGH (ref 70–99)
Glucose-Capillary: 84 mg/dL (ref 70–99)
Glucose-Capillary: 128 mg/dL — ABNORMAL HIGH (ref 70–99)

## 2024-03-20 LAB — TYPE AND SCREEN
ABO/RH(D): O POS
Antibody Screen: NEGATIVE
Unit division: 0
Unit division: 0

## 2024-03-20 LAB — MAGNESIUM: Magnesium: 2.6 mg/dL — ABNORMAL HIGH (ref 1.7–2.4)

## 2024-03-20 LAB — ECHOCARDIOGRAM LIMITED
Area-P 1/2: 4.21 cm2
Calc EF: 38.9 %
Height: 76 in
S' Lateral: 2.8 cm
Single Plane A2C EF: 35.4 %
Single Plane A4C EF: 45 %
Weight: 3075.86 [oz_av]

## 2024-03-20 LAB — BPAM RBC
Blood Product Expiration Date: 202508262359
Blood Product Expiration Date: 202508262359
ISSUE DATE / TIME: 202508060408
ISSUE DATE / TIME: 202508060408
Unit Type and Rh: 5100
Unit Type and Rh: 5100

## 2024-03-20 LAB — BASIC METABOLIC PANEL WITH GFR
Anion gap: 5 (ref 5–15)
BUN: 49 mg/dL — ABNORMAL HIGH (ref 8–23)
CO2: 26 mmol/L (ref 22–32)
Calcium: 7.8 mg/dL — ABNORMAL LOW (ref 8.9–10.3)
Chloride: 108 mmol/L (ref 98–111)
Creatinine, Ser: 1.11 mg/dL (ref 0.61–1.24)
GFR, Estimated: >60 mL/min (ref >60)
Glucose, Bld: 161 mg/dL — ABNORMAL HIGH (ref 70–99)
Potassium: 3.9 mmol/L (ref 3.5–5.1)
Sodium: 139 mmol/L (ref 135–145)

## 2024-03-20 LAB — PHOSPHORUS: Phosphorus: 2.9 mg/dL (ref 2.5–4.6)

## 2024-03-20 MED ORDER — SORBITOL 70 % SOLN
30.0000 mL | Freq: Once | Status: AC
  Administered 2024-03-20: 30 mL via ORAL
  Filled 2024-03-20: qty 30

## 2024-03-20 MED ORDER — INSULIN ASPART 100 UNIT/ML IJ SOLN
3.0000 [IU] | INTRAMUSCULAR | Status: DC
Start: 2024-03-20 — End: 2024-03-22
  Administered 2024-03-20 – 2024-03-22 (×10): 3 [IU] via SUBCUTANEOUS

## 2024-03-20 MED ORDER — FUROSEMIDE 10 MG/ML IJ SOLN
80.0000 mg | Freq: Once | INTRAMUSCULAR | Status: AC
  Administered 2024-03-20: 80 mg via INTRAVENOUS
  Filled 2024-03-20: qty 8

## 2024-03-20 MED ORDER — PERFLUTREN LIPID MICROSPHERE
1.0000 mL | INTRAVENOUS | Status: AC | PRN
  Administered 2024-03-20: 2 mL via INTRAVENOUS

## 2024-03-20 MED ORDER — POTASSIUM CHLORIDE 20 MEQ PO PACK
20.0000 meq | PACK | Freq: Once | ORAL | Status: AC
  Administered 2024-03-20: 20 meq
  Filled 2024-03-20: qty 1

## 2024-03-20 MED ORDER — MILRINONE LACTATE IN DEXTROSE 20-5 MG/100ML-% IV SOLN
0.1250 ug/kg/min | INTRAVENOUS | Status: DC
  Administered 2024-03-20 – 2024-03-21 (×2): 0.125 ug/kg/min via INTRAVENOUS
  Filled 2024-03-20 (×2): qty 100

## 2024-03-20 MED ORDER — METOPROLOL TARTRATE 12.5 MG HALF TABLET: 12.5000 mg | ORAL_TABLET | Freq: Two times a day (BID) | ORAL | Status: DC

## 2024-03-20 NOTE — Progress Notes (Signed)
 2D echo attempted, patient going to MRI. Will try later

## 2024-03-20 NOTE — Progress Notes (Signed)
 Patient ID: Alan [name], male   DOB: 1956/05/19, 68 y.o.   MRN: [id number]  TCTS Evening Rounds:  Hemodynamically stable in sinus rhythm on IV amio. Started on milrinone  0.125 this afternoon for low Co-ox 52.5.  Remains on vent. More responsive per nurse. MRI showed multiple small infarcts in different vascular territories of both hemispheres.  UO good. CT output low.  Continue supportive care.

## 2024-03-20 NOTE — Progress Notes (Signed)
 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  Admitted 7/28 w/ chest pain. Initially started 7/26 went to ER and CEs and EKG neg so went home but CP persisted so returned.  In ER on 28th + trop I, new T wave inversion in anterior lateral leads.  Treated w/ supplemental oxygen, morphine , started on heparin  gtt.  Went to cath lab  Findings:  99% -80% in-stent restenosis of LAD balloon angioplasty completed w/ partial restoration of flow (this re-stenosed stent was felt culprit lesion). RCA 45 to 70% stenosed, cx was 40%. Had nml LVEDP, no AS, referred to CVTS for CABG given 3V disease. and placed on plavix . EF from ECHO 30-35% w/ left regional wall motion abnormality.   Went to OR 8/4 for planned CABG x 3, as well as repair of ascending aortic dissection w/ vascular graft and repair of aortic valve  Time on bypass: ~ 4h23min EBL 1390 Cell saver 910 Received: FFP X2, PLTs 1 unit PRBC and cryo for on going surgical oozing Arrived in ICU w 480 ml bloody outpt from Chest tube Surgical team at bedside on arrival  Shortly after arrival to ICU had runs of VT and AF w/ RVR. Amiodarone  bolus and gtt initiated.  Got 2 gms calcium  another 2 units PLTs. TXA  Pertinent  Medical History  Prior CAD w/ DES X 2 and prior minimally invasive MVR 2015, NSCLC q/ mets to bone (2023) s/p chemo and XRT w/ most recent scans showing stable non-active disease  NSTEMI, HLD Significant Hospital Events: Including procedures, antibiotic start and stop dates in addition to other pertinent events   7/28 admitted w/ CP and acute inf/lat MI, cardiac cath svr 3V disease w/ re-stenosed LAD stent and  incomplete restoration of coronary flow. EF 30-35% referred for CABG 8/4 to OR. CABG x 3 v but as completing CABG noted blood loss and intra-op ECHO showed dissection of Aorta. So underwent AV graft placement and AVR. Large EBL in OR received several blood products. Immediate post op w/ large vol CT output, runs of VT and SVT. Got 2gms calcium , amiodarone  and placed on overdrive pacing via pacer critical care at bedside assisting w/ support  8/5 myoclonus; normal head CT& CTA, EEG started. Loaded with keppra .  8/6 remained on EEG, weaned off propofol   Interim History / Subjective:  Overnight no acute events. No additional episodes of myoclonus.  Objective    Blood pressure (!) 140/52, pulse (!) 103, temperature 100 F (37.8 C), resp. rate (!) 24, height 6' 4 (1.93 m), weight 87.2 kg, SpO2 93%. PAP: (30-43)/(13-25) 38/25 CVP:  [11 mmHg-23 mmHg] 12 mmHg CO:  [6.4 L/min] 6.4 L/min CI:  [3.1 L/min/m2] 3.1 L/min/m2  Vent Mode: SIMV;PRVC;PSV FiO2 (%):  [40 %] 40 % Set Rate:  [16 bmp] 16 bmp Vt Set:  [620 mL] 620 mL PEEP:  [5 cmH20] 5 cmH20 Pressure Support:  [10 cmH20] 10 cmH20 Plateau Pressure:  [16 cmH20-17 cmH20] 17 cmH20   Intake/Output Summary (Last 24 hours) at 03/20/2024 0720 Last data filed at 03/20/2024 0600 Gross per 24 hour  Intake 2235.38 ml  Output 2095 ml  Net  140.38 ml   Filed Weights   03/18/24 0500 03/19/24 0500 03/20/24 0500  Weight: 85.3 kg 84.1 kg 87.2 kg    Examination: General: critically ill appearing man lying in bed, awake, intubated HENT: [place]/AT, ETT in place Lungs: intermittent air leak, minimal bloody output from chest tubesssssss Cardiovascular: S1S2, RRR Abdomen: soft, NT Extremities: +edema, bruising Neuro: awake, tracking, able to nod y/n to answer questions. Sticks out tongue. Weakly moving hands and feet on command.  GU: amber urine  BUN 49 Cr 1.11 WBC 8.4 H/H 9.9/30.5 Platelets 48 Coox 56%  CXR personally reviewed>ETT 5 cm above carina,  enlarging left pneumothorax. Chest tubes in place.    Resolved problem list   Assessment and Plan   3V CAD and restenosis of DES now s/p CABG x 3 V w/ intraoperative finding of ascending aorta dissection requiring aortic graft and AVR Acute on chronic HFrEF due to ICM with cardiogenic shock. H/o HLD, HLD and CAD w/ severe mitral valve disease. Prior stents and mini- mitral valve repair (2015) for severe MR. -con't chest tubes per TCTS -aspirin , statin -hold DVT prophylaxis due to thrombocytopenia -start metoprolol  today -lasix   Post-op VT; not recurrent -d/c amidoarone today -pacing wires out -monitor electrolytes and replete as needed  Prolonged Qtc -tele monitoring  Acute encephalopathy> improved today, awake Myoclonic seizures; likely has some degree of anoxic brain injury -cEEG  -appreciate neurology's management; con't keppra  & get MRI today  -PRN sedation -neuroprotective measures  Post operative acute blood loss w/ consumptive coagulopathy & thrombocytopenia following prolonged CPB time  hypocalcemia  -no need for transfusion, but holdig DVT prophylaxis -monitor  Post-operative vent management  -LTVV- switched to PRVC due to his discomfort on SIMV -PAD protocol -VAP prevention protocol -daily SAT & SBT once appropriate; seems profoundly weak right now and was not tolerating SIMV due to work of breathing  Small left pneumothorax > enlarging slightly, residual right hemothorax> resolved -chest tubes to suction -AM CXR  H/o RLL stage IV lung adenocarcinoma w/ bone mets. Last PET w/ no active disease- in remission since 2023. Possible new sclerotic lesion on CXR with small LUL nodule. -eventually needs CT for further evaluation   Hyperglycemia; A1c 5.0 -con't semglee  24 units -add TF coverage aspart 3 units q4h -SSI PRN -goal BG 140-180  At risk for malnutrition -con't TF  Plan discussed with Neuro, AHF, TCTS.   Best Practice (right click and Reselect  all SmartList Selections daily)   Diet/type: tubefeeds DVT prophylaxis SCD Pressure ulcer(s): N/A GI prophylaxis: PPI Lines: Central line, Arterial Line, and yes and it is still needed Foley:  Yes, and it is still needed Code Status:  full code Last date of multidisciplinary goals of care discussion [per primary ]  Labs   CBC: Recent Labs  Lab 03/18/24 1641 03/18/24 2047 03/19/24 0222 03/19/24 1000 03/20/24 0413  WBC 11.4* 12.8* 12.9* 9.1 8.4  HGB 7.5* 7.9* 7.6* 9.8* 9.9*  HCT 21.8* 23.2* 21.9* 28.4* 30.5*  MCV 87.9 88.9 90.1 89.3 92.1  PLT 100* 114* 101* 67* 48*    Basic Metabolic Panel: Recent Labs  Lab 03/18/24 0500 03/18/24 1130 03/18/24 1641 03/19/24 0226 03/19/24 1000 03/20/24 0413  NA 143 141 141 139 139 139  K 3.9 4.5 4.2 4.0 4.2 3.9  CL 111 109 108 107 108 108  CO2 21* 22 23 22 22 26   GLUCOSE 136* 133* 119* 161* 171* 161*  BUN 18 18 24* 34* 40* 49*  CREATININE 1.07 1.06 1.19 1.17  1.18 1.11  CALCIUM  7.9* 8.0* 7.7* 7.8* 7.8* 7.8*  MG 2.5* 2.6* 2.5* 2.6*  --  2.6*  PHOS  --  4.7*  --  4.4  --  2.9    ABG    Component Value Date/Time   PHART 7.409 03/17/2024 2158   PCO2ART 32.7 03/17/2024 2158   PO2ART 80 (L) 03/17/2024 2158   HCO3 20.7 03/17/2024 2158   TCO2 22 03/17/2024 2158   ACIDBASEDEF 3.0 (H) 03/17/2024 2158   O2SAT 56 03/20/2024 0413     Coagulation Profile: Recent Labs  Lab 03/16/24 0410 03/17/24 1446 03/17/24 1615 03/19/24 0222  INR 1.0 2.3* 1.8* 1.5*     Critical care time:    This patient is critically ill with multiple organ system failure which requires frequent high complexity decision making, assessment, support, evaluation, and titration of therapies. This was completed through the application of advanced monitoring technologies and extensive interpretation of multiple databases. During this encounter critical care time was devoted to patient care services described in this note for 38 minutes.   Leita SHAUNNA Gaskins, DO  03/20/24 8:18 AM [facility]  For contact information, see Amion. If no response to pager, please call PCCM consult pager. After hours, 7PM- 7AM, please call Elink.

## 2024-03-20 NOTE — Progress Notes (Signed)
 3 Days Post-Op Procedure(s) (LRB): CORONARY ARTERY BYPASS GRAFTING TIMES THREE , USING LEFT INTERNAL MAMMARY ARTERY AND ENDOSCOPIC HARVESTED RIGHT SAPHENOUS VEIN (N/A) ECHOCARDIOGRAM, TRANSESOPHAGEAL, INTRAOPERATIVE (N/A) REPAIR, AORTIC DISSECTION, ASCENDING USING 30 MM HEMASHIELD PLATINUM VASCULAR GRAFT RESUSPENSION OF AORTIC VALVE Subjective: Opening eyes and following simple commands  Objective: Vital signs in last 24 hours: Temp:  [99.3 F (37.4 C)-100.9 F (38.3 C)] 100 F (37.8 C) (08/07 0600) Pulse Rate:  [94-116] 103 (08/07 0600) Cardiac Rhythm: Sinus tachycardia (08/07 0400) Resp:  [8-37] 24 (08/07 0600) BP: (140)/(52) 140/52 (08/06 0847) SpO2:  [90 %-97 %] 93 % (08/07 0600) Arterial Line BP: (120-163)/(44-60) 142/52 (08/07 0600) FiO2 (%):  [40 %] 40 % (08/07 0759) Weight:  [87.2 kg] 87.2 kg (08/07 0500)  Hemodynamic parameters for last 24 hours: PAP: (31-43)/(16-25) 38/25 CVP:  [11 mmHg-23 mmHg] 12 mmHg  Intake/Output from previous day: 08/06 0701 - 08/07 0700 In: 2235.4 [I.V.:434.5; Blood:315; NG/GT:1460.9; IV Piggyback:25] Out: 2095 [Urine:1635; Chest Tube:460] Intake/Output this shift: No intake/output data recorded.  General appearance: cooperative and slowed mentation Neurologic: generalized weakness, moves all 4, answers yes/no Heart: regular rate and rhythm Lungs: clear to auscultation bilaterally Abdomen: normal findings: soft, non-tender  Lab Results: Recent Labs    03/19/24 1000 03/20/24 0413  WBC 9.1 8.4  HGB 9.8* 9.9*  HCT 28.4* 30.5*  PLT 67* 48*   BMET:  Recent Labs    03/19/24 1000 03/20/24 0413  NA 139 139  K 4.2 3.9  CL 108 108  CO2 22 26  GLUCOSE 171* 161*  BUN 40* 49*  CREATININE 1.18 1.11  CALCIUM  7.8* 7.8*    PT/INR:  Recent Labs    03/19/24 0222  LABPROT 18.9*  INR 1.5*   ABG    Component Value Date/Time   PHART 7.409 03/17/2024 2158   HCO3 20.7 03/17/2024 2158   TCO2 22 03/17/2024 2158   ACIDBASEDEF 3.0 (H)  03/17/2024 2158   O2SAT 56 03/20/2024 0413   CBG (last 3)  Recent Labs    03/19/24 1959 03/19/24 2335 03/20/24 0331  GLUCAP 212* 203* 169*    Assessment/Plan: S/P Procedure(s) (LRB): CORONARY ARTERY BYPASS GRAFTING TIMES THREE , USING LEFT INTERNAL MAMMARY ARTERY AND ENDOSCOPIC HARVESTED RIGHT SAPHENOUS VEIN (N/A) ECHOCARDIOGRAM, TRANSESOPHAGEAL, INTRAOPERATIVE (N/A) REPAIR, AORTIC DISSECTION, ASCENDING USING 30 MM HEMASHIELD PLATINUM VASCULAR GRAFT RESUSPENSION OF AORTIC VALVE POD # 3  NEURO- significant improvement overnight  Moving all 4 and following commands  Profoundly weak   D/w CCM/ Neurology- for MR today CV- in Sr, BP elevated  Start metoprolol   No further arrhythmias- dc amiodarone  later today RESP- VDRF- not ready to wean  Vent per CCM RENAL- creatinine 1.1, lytes OK  BUN elevated ENDO- CBG elevated  Adjust insulin  GI- on TF Anemia- Hct 30, monitor Thrombocytopenia- PLT down to 48K  No signs of bleeding, no heparin  since OR  Monitor, no enoxaparin  SCD for DVT prophylaxis  LOS: 10 days    Elspeth JAYSON Millers 03/20/2024

## 2024-03-20 NOTE — Progress Notes (Signed)
  Echocardiogram 2D Echocardiogram has been performed.  Alan [name] 03/20/2024, 3:20 PM

## 2024-03-20 NOTE — Progress Notes (Signed)
 LTM VIDEO EEG discontinued - no skin breakdown at [facility]

## 2024-03-20 NOTE — Progress Notes (Signed)
 NEUROLOGY CONSULT FOLLOW UP NOTE   Date of service: March 20, 2024 Patient Name: Alan [name] MRN:  [id number] DOB:  September 22, 1955  Interval Hx/subjective  Off of sedation since last night. Following midline commands  Vitals   Vitals:   03/20/24 0500 03/20/24 0515 03/20/24 0530 03/20/24 0600  BP:      Pulse: (!) 104 (!) 104 (!) 103 (!) 103  Resp: (!) 32 (!) 29 (!) 28 (!) 24  Temp: 100 F (37.8 C) 99.9 F (37.7 C) 100 F (37.8 C) 100 F (37.8 C)  TempSrc:      SpO2: 94% 94% 93% 93%  Weight: 87.2 kg     Height:         Body mass index is 23.4 kg/m.  Physical Exam   General: Well-developed well-nourished man, no sedation, intubated HEENT: Normocephalic atraumatic Lungs: Clear/vented Cardiovascular: Regular rate rhythm Neurological exam Intubated No sedation Spontaneously opens eyes Tracks examiner Nods yes and no appropriately Was able to stick his tongue out to command but was not able to give me a thumbs up or wiggle his toes to command Cranial nerves II to XII appear grossly intact Motor examination: As above Grimace to noxious simulation in all fours   Medications  Current Facility-Administered Medications:    0.9 %  sodium chloride  infusion (Manually program via Guardrails IV Fluids), , Intravenous, Once, Kerrin Elspeth BROCKS, MD   0.9 %  sodium chloride  infusion (Manually program via Guardrails IV Fluids), , Intravenous, Once, Jenna Maude BRAVO, NP   acetaminophen  (TYLENOL ) tablet 1,000 mg, 1,000 mg, Oral, Q6H, 1,000 mg at 03/18/24 1208 **OR** acetaminophen  (TYLENOL ) 160 MG/5ML solution 1,000 mg, 1,000 mg, Per Tube, Q6H, Gold, Wayne E, PA-C, 1,000 mg at 03/20/24 0529   albumin  human 5 % solution 12.5 g, 250 mL, Intravenous, Q15 min PRN, Gold, Wayne E, PA-C, Stopped at 03/18/24 0335   [COMPLETED] amiodarone  (NEXTERONE ) 1.8 mg/mL load via infusion 150 mg, 150 mg, Intravenous, Once, 150 mg at 03/17/24 1621 **FOLLOWED BY** [EXPIRED] amiodarone  (NEXTERONE  PREMIX)  360-4.14 MG/200ML-% (1.8 mg/mL) IV infusion, 60 mg/hr, Intravenous, Continuous, Stopping previously hung infusion at 03/18/24 0700 **FOLLOWED BY** amiodarone  (NEXTERONE  PREMIX) 360-4.14 MG/200ML-% (1.8 mg/mL) IV infusion, 30 mg/hr, Intravenous, Continuous, Jenna Maude BRAVO, NP, Last Rate: 16.67 mL/hr at 03/20/24 0600, 30 mg/hr at 03/20/24 0600   aspirin  EC tablet 325 mg, 325 mg, Oral, Daily **OR** aspirin  chewable tablet 324 mg, 324 mg, Per Tube, Daily, Gold, Wayne E, PA-C, 324 mg at 03/19/24 9081   atorvastatin  (LIPITOR) tablet 80 mg, 80 mg, Per Tube, Daily, Salam, Savannah B, RPH, 80 mg at 03/19/24 9081   bisacodyl  (DULCOLAX) EC tablet 10 mg, 10 mg, Oral, Daily, 10 mg at 03/19/24 0918 **OR** bisacodyl  (DULCOLAX) suppository 10 mg, 10 mg, Rectal, Daily, Gold, Wayne E, PA-C   Chlorhexidine  Gluconate Cloth 2 % PADS 6 each, 6 each, Topical, Daily, Hendrickson, Steven C, MD, 6 each at 03/19/24 1000   Chlorhexidine  Gluconate Cloth 2 % PADS 6 each, 6 each, Topical, Daily, Hendrickson, Steven C, MD, 6 each at 03/19/24 1000   docusate (COLACE) 50 MG/5ML liquid 100 mg, 100 mg, Per Tube, BID, Gretta Leita SQUIBB, DO, 100 mg at 03/19/24 2257   EPINEPHrine  (ADRENALIN ) 5 mg in NS 250 mL (0.02 mg/mL) premix infusion, 0-10 mcg/min, Intravenous, Titrated, Gold, Wayne E, PA-C, Stopping previously hung infusion at 03/19/24 0701   feeding supplement (PROSource TF20) liquid 60 mL, 60 mL, Per Tube, BID, Clark, Laura P, DO, 60 mL at  03/19/24 2257   feeding supplement (VITAL 1.5 CAL) liquid 1,000 mL, 1,000 mL, Per Tube, Continuous, Gretta Leita SQUIBB, DO, Last Rate: 55 mL/hr at 03/20/24 0600, Infusion Verify at 03/20/24 0600   fentaNYL  (SUBLIMAZE ) injection 50 mcg, 50 mcg, Intravenous, Q15 min PRN, Gretta Leita SQUIBB, DO, 50 mcg at 03/19/24 1625   fentaNYL  (SUBLIMAZE ) injection 50-200 mcg, 50-200 mcg, Intravenous, Q30 min PRN, Gretta Leita SQUIBB, DO   furosemide  (LASIX ) injection 80 mg, 80 mg, Intravenous, Once, Colletta Manuelita Garre, PA-C    insulin  aspart (novoLOG ) injection 0-24 Units, 0-24 Units, Subcutaneous, Q4H, Gretta Leita SQUIBB, DO, 4 Units at 03/20/24 0404   insulin  aspart (novoLOG ) injection 3 Units, 3 Units, Subcutaneous, Q4H, Gretta Leita P, DO   insulin  glargine-yfgn (SEMGLEE ) injection 24 Units, 24 Units, Subcutaneous, Daily, Gretta Leita SQUIBB, DO, 24 Units at 03/19/24 9177   ipratropium-albuterol  (DUONEB) 0.5-2.5 (3) MG/3ML nebulizer solution 3 mL, 3 mL, Nebulization, Q4H PRN, Gretta Leita SQUIBB, DO   levETIRAcetam  (KEPPRA ) undiluted injection 1,000 mg, 1,000 mg, Intravenous, Q12H, Jewelianna Pancoast, MD, 1,000 mg at 03/19/24 2049   metoprolol  tartrate (LOPRESSOR ) injection 2.5-5 mg, 2.5-5 mg, Intravenous, Q2H PRN, Gold, Wayne E, PA-C   midazolam  (VERSED ) injection 2 mg, 2 mg, Intravenous, Q1H PRN, Gold, Wayne E, PA-C, 2 mg at 03/18/24 9194   norepinephrine  (LEVOPHED ) 16 mg in (0.064 mg/mL) premix infusion, 0-40 mcg/min, Intravenous, Titrated, Kerrin Elspeth BROCKS, MD, Stopped at 03/19/24 1013   ondansetron  (ZOFRAN ) injection 4 mg, 4 mg, Intravenous, Q6H PRN, Viviane Lemond BRAVO, PA-C   Oral care mouth rinse, 15 mL, Mouth Rinse, Q2H, Kerrin Elspeth BROCKS, MD, 15 mL at 03/20/24 9340   oxyCODONE  (Oxy IR/ROXICODONE ) immediate release tablet 5-10 mg, 5-10 mg, Per Tube, Q3H PRN, Emmette Fraction B, RPH, 5 mg at 03/19/24 1610   pantoprazole  (PROTONIX ) injection 40 mg, 40 mg, Intravenous, QHS, Salam, Savannah B, RPH, 40 mg at 03/19/24 2257   polyethylene glycol (MIRALAX  / GLYCOLAX ) packet 17 g, 17 g, Per Tube, Daily, Gretta Leita SQUIBB, DO, 17 g at 03/19/24 9081   potassium chloride  (KLOR-CON ) packet 20 mEq, 20 mEq, Per Tube, Once, Colletta Manuelita Garre, PA-C   propofol  (DIPRIVAN ) 1000 MG/100ML infusion, 0-80 mcg/kg/min, Intravenous, Continuous, Gretta Leita SQUIBB, DO, Stopped at 03/19/24 1039   sodium chloride  flush (NS) 0.9 % injection 10-40 mL, 10-40 mL, Intracatheter, Q12H, Kerrin Elspeth BROCKS, MD, 10 mL at 03/19/24 2257   sodium chloride  flush  (NS) 0.9 % injection 3 mL, 3 mL, Intravenous, Q12H, Gold, Wayne E, PA-C, 3 mL at 03/19/24 2257   sodium chloride  flush (NS) 0.9 % injection 3 mL, 3 mL, Intravenous, PRN, Gold, Wayne E, PA-C   sorbitol  70 % solution 30 mL, 30 mL, Oral, Once, Gretta Leita SQUIBB, DO  Labs and Diagnostic Imaging   CBC:  Recent Labs  Lab 03/19/24 1000 03/20/24 0413  WBC 9.1 8.4  HGB 9.8* 9.9*  HCT 28.4* 30.5*  MCV 89.3 92.1  PLT 67* 48*    Basic Metabolic Panel:  Lab Results  Component Value Date   NA 139 03/20/2024   K 3.9 03/20/2024   CO2 26 03/20/2024   GLUCOSE 161 (H) 03/20/2024   BUN 49 (H) 03/20/2024   CREATININE 1.11 03/20/2024   CALCIUM  7.8 (L) 03/20/2024   GFRNONAA >60 03/20/2024   GFRAA >60 05/12/2020   Lipid Panel:  Lab Results  Component Value Date   LDLCALC 58 03/12/2024   HgbA1c:  Lab Results  Component Value Date   HGBA1C  5.0 03/12/2024   INR  Lab Results  Component Value Date   INR 1.5 (H) 03/19/2024   APTT  Lab Results  Component Value Date   APTT 35 03/19/2024   CT Head without contrast(Personally reviewed): No acute abnormality, atrophy and chronic small vessel ischemic disease  CT angio Head and Neck with contrast(Personally reviewed): 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 male with history of CAD, hypertension, hyperlipidemia, mitral valve prolapse status postrepair, SVT status post ablation, metastatic small cell lung cancer in remission who was originally admitted after a non-STEMI and underwent CABG which was complicated by intraoperative aortic dissection.  After sedation was held, he was noted to have a lot of twitching with LPD's  on the EEG along with myoclonic status epilepticus-2 episodes.  Hypoxic/anoxic brain injury is suspected but with weaning sedation, status started to improve when he is following midline commands..   With this dissection, I still suspect embolic strokes versus hypoxic/ischemic encephalopathy if not frank anoxic brain injury  Recommendations  Discontinue LTM EEG for now Obtain MRI of the brain Continue supportive care and postop care per cardiothoracic surgery and critical care. Discussed with the CT surgery team and PCCM team on the unit Will discuss with wife when she is at the hospital.  -- Eligio Lav, MD Neurologist [facility] Pager: [phone]    CRITICAL CARE ATTESTATION Performed by: Eligio Lav, MD Total critical care time: 30 minutes Critical care time was exclusive of separately billable procedures and treating other patients and/or supervising APPs/Residents/Students Critical care was necessary to treat or prevent imminent or life-threatening deterioration. This patient is critically ill and at significant risk for neurological worsening and/or death and care requires constant monitoring. Critical care was time spent personally by me on the following activities: development of treatment plan with patient and/or surrogate as well as nursing, discussions with consultants, evaluation of patient's response to treatment, examination of patient, obtaining history from patient or surrogate, ordering and performing treatments and interventions, ordering and review of laboratory studies, ordering and review of radiographic studies, pulse oximetry, re-evaluation of patient's condition, participation in multidisciplinary rounds and medical decision making of high complexity in the care of this patient.

## 2024-03-20 NOTE — Procedures (Addendum)
 Patient Name: Alan [name]  MRN: [id number]  Epilepsy Attending: Arlin MALVA Krebs  Referring Physician/Provider: Jorene Last, NP Duration: 03/19/2024 0730 to 03/20/2024 9074  Patient history:  68 year old male presenting with altered mental status and seizure-like activity. Continuous video-EEG monitoring was performed to evaluate for seizure.   Level of alertness:  lethargic larnell  AEDs during EEG study: LEV  Technical aspects: This EEG study was done with scalp electrodes positioned according to the 10-20 International system of electrode placement. Electrical activity was reviewed with band pass filter of 1-70Hz , sensitivity of 7 uV/mm, display speed of 74mm/sec with a 60Hz  notched filter applied as appropriate. EEG data were recorded continuously and digitally stored.  Video monitoring was available and reviewed as appropriate.  Description: At the beginning of the study, EEG showed continuous generalized 3 to 7 Hz theta-delta slowing admixed with 13 to 15 Hz beta activity. Gradually as sedation was weaned, EEG showed continuous generalized polymorphic high amplitude 3 to 6 Hz theta-delta slowing. Hyperventilation and photic stimulation were not performed.     Event button was pressed on 03/19/2024 at 1024, 1309, 1335 and 1511 for intermittent bilateral lower extremity twitching as well as shoulder twitching.  Concomitant EEG showed generalized polymorphic 3 to 5 Hz theta-delta slowing admixed with sharp transients without definite evolution.  ABNORMALITY - Continuous slow, generalized  IMPRESSION: This study is suggestive of moderate diffuse encephalopathy.  No definite seizures were noted during the study.  Event monitor was pressed on  03/19/2024 at 1024, 1309, 1335 and 1511 for intermittent bilateral lower extremity twitching as well as shoulder twitching without definite EEG change.  These events are most likely nonepileptic.  Subcortical myoclonus can have similar  appearance.  Jolisa Intriago O Odus Clasby

## 2024-03-20 NOTE — Progress Notes (Addendum)
 CO-OX marginal at 52% this afternoon.   Diuresis not very robust with 80 mg lasix  IV.   He is cool on exam.  Will restart low-dose milrinone  at 0.125 mcg/kg/min. Give 80 mg lasix  IV once milrinone  is running.

## 2024-03-20 NOTE — Progress Notes (Addendum)
 Advanced Heart Failure Rounding Note  Cardiologist: [name], MD   Chief Complaint: Post-op CABG and Bentall procedure   Subjective:    POD #3  Off all inotropes and pressors. CO-OX 56% with Fick CI of 2.4.   CVP 17.  He opens his eyes and follows commands. Very weak. Remains intubated  Objective:   Weight Range: 87.2 kg Body mass index is 23.4 kg/m.   Vital Signs:   Temp:  [99.5 F (37.5 C)-100.9 F (38.3 C)] 100 F (37.8 C) (08/07 0600) Pulse Rate:  [103-116] 103 (08/07 0600) Resp:  [8-37] 24 (08/07 0600) SpO2:  [90 %-97 %] 93 % (08/07 0600) Arterial Line BP: (120-163)/(45-60) 142/52 (08/07 0600) FiO2 (%):  [40 %] 40 % (08/07 0759) Weight:  [87.2 kg] 87.2 kg (08/07 0500) Last BM Date : 03/15/24  Weight change: Filed Weights   03/18/24 0500 03/19/24 0500 03/20/24 0500  Weight: 85.3 kg 84.1 kg 87.2 kg    Intake/Output:   Intake/Output Summary (Last 24 hours) at 03/20/2024 1013 Last data filed at 03/20/2024 0600 Gross per 24 hour  Intake 1636.15 ml  Output 1970 ml  Net -333.85 ml      Physical Exam    General:  Critically ill appearing Cor: Regular rate & rhythm. Dressing over sternal incision. Lungs: Intubated Abdomen: soft, nondistended.  Extremities: + edema, lower extremities are cool Neuro: Awake. Able to move hands and feet. Nods yes/no.    Telemetry   ST 100s  Labs    CBC Recent Labs    03/19/24 1000 03/20/24 0413  WBC 9.1 8.4  HGB 9.8* 9.9*  HCT 28.4* 30.5*  MCV 89.3 92.1  PLT 67* 48*   Basic Metabolic Panel Recent Labs    91/93/74 0226 03/19/24 1000 03/20/24 0413  NA 139 139 139  K 4.0 4.2 3.9  CL 107 108 108  CO2 22 22 26   GLUCOSE 161* 171* 161*  BUN 34* 40* 49*  CREATININE 1.17 1.18 1.11  CALCIUM  7.8* 7.8* 7.8*  MG 2.6*  --  2.6*  PHOS 4.4  --  2.9   Liver Function Tests Recent Labs    03/19/24 1000  AST 243*  ALT 361*  ALKPHOS 71  BILITOT 2.2*  PROT 4.4*  ALBUMIN  2.5*   No results for  input(s): LIPASE, AMYLASE in the last 72 hours. Cardiac Enzymes No results for input(s): CKTOTAL, CKMB, CKMBINDEX, TROPONINI in the last 72 hours.  BNP: BNP (last 3 results) No results for input(s): BNP in the last 8760 hours.  ProBNP (last 3 results) No results for input(s): PROBNP in the last 8760 hours.   D-Dimer No results for input(s): DDIMER in the last 72 hours. Hemoglobin A1C No results for input(s): HGBA1C in the last 72 hours. Fasting Lipid Panel Recent Labs    03/19/24 0600  TRIG 95   Thyroid  Function Tests No results for input(s): TSH, T4TOTAL, T3FREE, THYROIDAB in the last 72 hours.  Invalid input(s): FREET3  Other results:   Imaging    DG Chest Port 1 View Result Date: 03/20/2024 CLINICAL DATA:  Fall [id number].  Status post CABG. EXAM: PORTABLE CHEST 1 VIEW COMPARISON:  Portable chest yesterday at 6:43 p.m. FINDINGS: 5:24 a.m. ETT tip is mid tracheal 5.4 cm from the carina. Right IJ catheter introducer sheath terminates in the upper SVC. NGT enters the stomach with the side hole in the distal esophagus and should be advanced further in 8-10 cm. A mediastinal drain remains in place as  does a left chest tube. There is a tangle of overlying monitor wiring. There is a small unchanged left apical pneumothorax with 1.5 cm pleural-parenchymal separation. Estimated pneumothorax volume is 5% or less. There is increased right pleural effusion today, stable small left effusion. There is patchy atelectasis or consolidation in both lower lung fields, stable on the left and increased on the right. The mid and upper lungs are clear. The cardiomediastinal silhouette and vasculature are stable. No new osseous abnormality. IMPRESSION: 1. Small unchanged left apical pneumothorax with 1.5 cm pleural-parenchymal separation. 2. Increased right pleural effusion today, stable small left effusion. 3. Patchy atelectasis or consolidation in both lower lung fields,  stable on the left and increased on the right. 4. NGT side hole in the distal esophagus and should be advanced further in 8-10 cm. Electronically Signed   By: Francis Quam M.D.   On: 03/20/2024 07:23   DG CHEST PORT 1 VIEW Result Date: 03/19/2024 CLINICAL DATA:  Hypoxia EXAM: PORTABLE CHEST 1 VIEW COMPARISON:  Same-day radiograph FINDINGS: Stable cardiomediastinal silhouette The Swan-Ganz catheter has been removed. Right IJ cast catheter sheath in the mid SVC. Subdiaphragmatic enteric tube. Endotracheal tube tip in the intrathoracic trachea 4.7 cm from the carina. Sternotomy. Mediastinal drain. Scarring in the right mid lung. Bibasilar atelectasis or infiltrates. No large pleural effusion. Decreased size of the small left apical pneumothorax. IMPRESSION: 1. Decreased size of the small left apical pneumothorax. 2. Bibasilar atelectasis or infiltrates. 3. Support apparatus as described. Electronically Signed   By: Norman Gatlin M.D.   On: 03/19/2024 19:22   Overnight EEG with video Result Date: 03/19/2024 Maurine Jurist, MD     03/19/2024 10:22 AM Continuous Video-EEG Monitoring Report CPT/Type of Study: 95720 (EEG with video, 12-26 hours)      Indications for Procedure: Altered mental status and seizure-like activity Primary neurological diagnosis: G93.40; R56.9 Duration of study: 03/18/2024 11:05 to 03/19/2024 07:30 History: This is a 68 year old male presenting with altered mental status and seizure-like activity. Continuous video-EEG monitoring was performed to evaluate for seizure. Technical Description: The EEG was performed using standard setting per the guidelines of American Clinical Neurophysiology Society (ACNS). A minimum of 21 electrodes were placed on scalp according to the International 10-20 or/and 10-10 Systems. Supplemental electrodes were placed as needed. Single EKG electrode was also used to detect cardiac arrhythmia. Patient's behavior was continuously recorded on video simultaneously with EEG.  A minimum of 16 channels were used for data display. Each epoch of study was reviewed manually daily and as needed using standard referential and bipolar montages. Computerized quantitative EEG analysis (such as compressed spectral array analysis, trending, automated spike & seizure detection) were used as indicated. EEG Description: Epoch: 03/18/2024 11:05 to 03/19/2024 07:30 Background: There was generalized polymorphic delta slowing. No posterior dominant rhythm or sleep architecture was recorded. PERIODIC OR RHYTHMIC PATTERNS: None. EPILEPTIFORM DISCHARGES: None interictally. SEIZURES: During the initial hours of the epoch, 2 episodes of prolonged (>20 minutes) generalized myoclonic seizures consistent with /status epilepticus were captured around 13:40 and 15:05 on 03/18/2024, characterized electrographically by rhythmic generalized spike-and-waves (most prominent in midline and bilateral parasagittal regions). Clinically, intermittent myoclonic movement of body and bilateral limbs were observed on video. EVENTS: As above. EKG: No significant arrhythmia. 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     Medications:     Scheduled Medications:  sodium chloride    Intravenous Once   sodium chloride    Intravenous Once   acetaminophen   1,000 mg Oral Q6H   Or   acetaminophen  (TYLENOL ) oral liquid 160 mg/5 mL  1,000 mg Per Tube Q6H   aspirin  EC  325 mg Oral Daily   Or   aspirin   324 mg Per Tube Daily   atorvastatin   80 mg Per Tube Daily   bisacodyl   10 mg Oral Daily   Or   bisacodyl   10 mg Rectal Daily   Chlorhexidine  Gluconate Cloth  6 each Topical Daily   Chlorhexidine  Gluconate Cloth  6 each Topical Daily   docusate  100 mg Per Tube BID    feeding supplement (PROSource TF20)  60 mL Per Tube BID   insulin  aspart  0-24 Units Subcutaneous Q4H   insulin  aspart  3 Units Subcutaneous Q4H   insulin  glargine-yfgn  24 Units Subcutaneous Daily   levETIRAcetam   1,000 mg Intravenous Q12H   mouth rinse  15 mL Mouth Rinse Q2H   pantoprazole  (PROTONIX ) IV  40 mg Intravenous QHS   polyethylene glycol  17 g Per Tube Daily   sodium chloride  flush  10-40 mL Intracatheter Q12H   sodium chloride  flush  3 mL Intravenous Q12H   sorbitol   30 mL Oral Once    Infusions:  albumin  human Stopped (03/18/24 0335)   amiodarone  30 mg/hr (03/20/24 0600)   epinephrine  Stopped (03/19/24 0701)   feeding supplement (VITAL 1.5 CAL) 55 mL/hr at 03/20/24 0600   norepinephrine  (LEVOPHED ) Adult infusion Stopped (03/19/24 1013)   propofol  (DIPRIVAN ) infusion Stopped (03/19/24 1039)    PRN Medications: albumin  human, fentaNYL  (SUBLIMAZE ) injection, fentaNYL  (SUBLIMAZE ) injection, ipratropium-albuterol , metoprolol  tartrate, midazolam , ondansetron  (ZOFRAN ) IV, oxyCODONE , sodium chloride  flush    Patient Profile  68 y.o. male with history of CAD, severe MR s/p minimally invasive mitral valve repair, stage III NSCLC.   Patient admitted with NSTEMI and new systolic CHF. Underwent 3v CABG X 3 c/b ascending aortic dissection.  Assessment/Plan   1. NSTEMI with known CAD -Remote anterior STEMI with prior stents to LAD -NSTEMI - 3 V CAD on cath with 99% in-stent restenosis in LAD, 50% residual stenosis post PTCA -CABG X 3 (LIMA to LAD, SVG to OM, SBVG to PDA) 03/17/24 as above -Aspirin  + statin.    2. Acute systolic CHF/ICM -Newly reduced EF -Echo 03/10/24: EF 30-35%, anterior WMA, RV mildly reduced -CO-OX 56% with Fick CI of 2.4. Off inotropes and all pressors. Recheck CO-OX this afternoon, if drops below 50% may need to consider restarting milrinone  -CVP 17. Weight up 18 lb from pre-op. Give 80 mg lasix  IV and assess response. -Check limited echo  3.  Pulm -Remains intubated. Awake and following commands but too weak for extubation -management per CCM  4. Severe MR s/p minimally invasive mitral valve repair -trivial MR with mean gradient of 4 mmhg on echo this admit   5. Postop anemia/thrombocytopenia -Multiple blood products in OR and immediately post-op -Hgb improved to 9.9 this am -Platelets trending down, 48K.  -Follow closely -Has not been on heparin  post-op. Holding DVT prophylaxis.  6. NSVT -No recurrence -Continue amiodarone  gtt for now.   7. NSCLC w/bone mets -S/p chemoradiation and immunotherapy -Stable disease on most recent outpatinet imaging. Followed by Heme/Onc -Possible new sclerotic lesion/small LUL nodule on CXR this admission. Will eventually require CT.  8. 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  - Keppra  - MRI brain  9. Left pneumothorax - CCM monitoring  CRITICAL CARE Performed by: COLLETTA SHAVER N   Total critical care time: 15 minutes  Critical care time was exclusive of separately billable procedures and treating other patients.  Critical care was necessary to treat or prevent imminent or life-threatening deterioration.  Critical care was time spent personally by me on the following activities: development of treatment plan with patient and/or surrogate as well as nursing, discussions with consultants, evaluation of patient's response to treatment, examination of patient, obtaining history from patient or surrogate, ordering and performing treatments and interventions, ordering and review of laboratory studies, ordering and review of radiographic studies, pulse oximetry and re-evaluation of patient's condition.    Length of Stay: [address] SHAVER SAILOR, PA-C  03/20/2024, 10:13 AM  Advanced Heart Failure Team Pager [phone] (M-F; 7a - 5p)  Please contact [facility] for night-coverage after hours (5p -7a )  and weekends on [email or web address]  Agree with above.  Remains intubated. Sedation weaned. Will open eyes and follow simple commands. Off pressors and inotropes as of this am.   General:  Intubated will wake up and follow commands HEENT: normal + ETT Neck: supple. Cor: Sternal dressing ok Regular rate & rhythm. No rubs, gallops or murmurs. Lungs: clear Abdomen: soft, nontender, nondistended. hypoactive bowel sounds. Extremities: no cyanosis, clubbing, rash, edema cool dopplerable PT pulses Neuro:awake on vent  wil follow simple commands  Neuro status is recovering. Will continue to wean sedation and work to extubation.   Co-ox marginal at 56%. Can restart milrinone  as needed. Echo today EF 40-45% with septal dyssynchrony. Gentle diuresis.   CRITICAL CARE Performed by: Cherrie Sieving  Total critical care time: 41 minutes  Critical care time was exclusive of separately billable procedures and treating other patients.  Critical care was necessary to treat or prevent imminent or life-threatening deterioration.  Critical care was time spent personally by me (independent of midlevel providers or residents) on the following activities: development of treatment plan with patient and/or surrogate as well as nursing, discussions with consultants, evaluation of patient's response to treatment, examination of patient, obtaining history from patient or surrogate, ordering and performing treatments and interventions, ordering and review of laboratory studies, ordering and review of radiographic studies, pulse oximetry and re-evaluation of patient's condition.  Sieving Cherrie, MD  3:27 PM

## 2024-03-21 ENCOUNTER — Inpatient Hospital Stay (HOSPITAL_COMMUNITY)

## 2024-03-21 ENCOUNTER — Other Ambulatory Visit: Payer: Self-pay

## 2024-03-21 DIAGNOSIS — I214 Non-ST elevation (NSTEMI) myocardial infarction: Secondary | ICD-10-CM | POA: Diagnosis not present

## 2024-03-21 DIAGNOSIS — J189 Pneumonia, unspecified organism: Secondary | ICD-10-CM

## 2024-03-21 DIAGNOSIS — I6340 Cerebral infarction due to embolism of unspecified cerebral artery: Secondary | ICD-10-CM | POA: Diagnosis not present

## 2024-03-21 DIAGNOSIS — I71012 Dissection of descending thoracic aorta: Secondary | ICD-10-CM

## 2024-03-21 DIAGNOSIS — Z951 Presence of aortocoronary bypass graft: Secondary | ICD-10-CM

## 2024-03-21 DIAGNOSIS — E440 Moderate protein-calorie malnutrition: Secondary | ICD-10-CM | POA: Insufficient documentation

## 2024-03-21 DIAGNOSIS — J9601 Acute respiratory failure with hypoxia: Secondary | ICD-10-CM

## 2024-03-21 DIAGNOSIS — T82898A Other specified complication of vascular prosthetic devices, implants and grafts, initial encounter: Secondary | ICD-10-CM | POA: Diagnosis not present

## 2024-03-21 DIAGNOSIS — R570 Cardiogenic shock: Secondary | ICD-10-CM | POA: Diagnosis not present

## 2024-03-21 DIAGNOSIS — I7100 Dissection of unspecified site of aorta: Secondary | ICD-10-CM | POA: Diagnosis not present

## 2024-03-21 LAB — BASIC METABOLIC PANEL WITH GFR
Anion gap: 7 (ref 5–15)
BUN: 52 mg/dL — ABNORMAL HIGH (ref 8–23)
CO2: 25 mmol/L (ref 22–32)
Calcium: 7.8 mg/dL — ABNORMAL LOW (ref 8.9–10.3)
Chloride: 110 mmol/L (ref 98–111)
Creatinine, Ser: 0.9 mg/dL (ref 0.61–1.24)
GFR, Estimated: >60 mL/min (ref >60)
Glucose, Bld: 145 mg/dL — ABNORMAL HIGH (ref 70–99)
Potassium: 3.6 mmol/L (ref 3.5–5.1)
Sodium: 142 mmol/L (ref 135–145)

## 2024-03-21 LAB — COOXEMETRY PANEL
Carboxyhemoglobin: 1.9 % — ABNORMAL HIGH (ref 0.5–1.5)
Methemoglobin: 0.9 % (ref 0.0–1.5)
O2 Saturation: 68.9 %
Total hemoglobin: 9.8 g/dL — ABNORMAL LOW (ref 12.0–16.0)

## 2024-03-21 LAB — MRSA NEXT GEN BY PCR, NASAL: MRSA by PCR Next Gen: NOT DETECTED

## 2024-03-21 LAB — CBC
HCT: 28.5 % — ABNORMAL LOW (ref 39.0–52.0)
Hemoglobin: 9.3 g/dL — ABNORMAL LOW (ref 13.0–17.0)
MCH: 30.4 pg (ref 26.0–34.0)
MCHC: 32.6 g/dL (ref 30.0–36.0)
MCV: 93.1 fL (ref 80.0–100.0)
Platelets: 41 K/uL — ABNORMAL LOW (ref 150–400)
RBC: 3.06 MIL/uL — ABNORMAL LOW (ref 4.22–5.81)
RDW: 17 % — ABNORMAL HIGH (ref 11.5–15.5)
WBC: 6.9 K/uL (ref 4.0–10.5)
nRBC: 2.7 % — ABNORMAL HIGH (ref 0.0–0.2)

## 2024-03-21 LAB — GLUCOSE, CAPILLARY
Glucose-Capillary: 134 mg/dL — ABNORMAL HIGH (ref 70–99)
Glucose-Capillary: 119 mg/dL — ABNORMAL HIGH (ref 70–99)
Glucose-Capillary: 135 mg/dL — ABNORMAL HIGH (ref 70–99)
Glucose-Capillary: 72 mg/dL (ref 70–99)
Glucose-Capillary: 96 mg/dL (ref 70–99)
Glucose-Capillary: 110 mg/dL — ABNORMAL HIGH (ref 70–99)
Glucose-Capillary: 142 mg/dL — ABNORMAL HIGH (ref 70–99)

## 2024-03-21 LAB — PHOSPHORUS: Phosphorus: 2.6 mg/dL (ref 2.5–4.6)

## 2024-03-21 LAB — MAGNESIUM: Magnesium: 2.6 mg/dL — ABNORMAL HIGH (ref 1.7–2.4)

## 2024-03-21 MED ORDER — PANTOPRAZOLE SODIUM 40 MG IV SOLR: 40.0000 mg | Freq: Two times a day (BID) | INTRAVENOUS | Status: DC

## 2024-03-21 MED ORDER — FAMOTIDINE IN NACL 20-0.9 MG/50ML-% IV SOLN
20.0000 mg | Freq: Two times a day (BID) | INTRAVENOUS | Status: DC
  Administered 2024-03-21 – 2024-03-28 (×21): 20 mg via INTRAVENOUS
  Filled 2024-03-21 (×15): qty 50

## 2024-03-21 MED ORDER — VANCOMYCIN HCL IN DEXTROSE 1-5 GM/200ML-% IV SOLN: 1000.0000 mg | Freq: Two times a day (BID) | INTRAVENOUS | Status: DC

## 2024-03-21 MED ORDER — POTASSIUM CHLORIDE 20 MEQ PO PACK
20.0000 meq | PACK | ORAL | Status: AC
  Administered 2024-03-21 (×3): 20 meq
  Filled 2024-03-21 (×3): qty 1

## 2024-03-21 MED ORDER — VANCOMYCIN HCL 1000 MG IV SOLR
1000.0000 mg | Freq: Two times a day (BID) | INTRAVENOUS | Status: DC
  Administered 2024-03-21 – 2024-03-22 (×3): 1000 mg via INTRAVENOUS
  Filled 2024-03-21 (×2): qty 20
  Filled 2024-03-21: qty 1000
  Filled 2024-03-21 (×3): qty 20

## 2024-03-21 MED ORDER — FLEET ENEMA RE ENEM: 1.0000 | ENEMA | Freq: Once | RECTAL | Status: DC

## 2024-03-21 MED ORDER — VANCOMYCIN HCL 1750 MG/350ML IV SOLN
1750.0000 mg | Freq: Once | INTRAVENOUS | Status: AC
  Administered 2024-03-21: 1750 mg via INTRAVENOUS
  Filled 2024-03-21: qty 350

## 2024-03-21 MED ORDER — LACTULOSE 10 GM/15ML PO SOLN
30.0000 g | Freq: Once | ORAL | Status: AC
  Administered 2024-03-21: 30 g via ORAL
  Filled 2024-03-21: qty 45

## 2024-03-21 MED ORDER — FUROSEMIDE 10 MG/ML IJ SOLN
60.0000 mg | Freq: Once | INTRAMUSCULAR | Status: AC
  Administered 2024-03-21: 60 mg via INTRAVENOUS
  Filled 2024-03-21: qty 6

## 2024-03-21 MED ORDER — SODIUM CHLORIDE 0.9 % IV SOLN: INTRAVENOUS | Status: AC

## 2024-03-21 MED ORDER — AMIODARONE HCL 200 MG PO TABS
200.0000 mg | ORAL_TABLET | Freq: Every day | ORAL | Status: DC
  Administered 2024-03-21 – 2024-03-23 (×3): 200 mg via ORAL
  Filled 2024-03-21 (×3): qty 1

## 2024-03-21 MED ORDER — LEVOFLOXACIN IN D5W 750 MG/150ML IV SOLN
750.0000 mg | INTRAVENOUS | Status: DC
  Administered 2024-03-21 – 2024-03-23 (×3): 750 mg via INTRAVENOUS
  Filled 2024-03-21 (×4): qty 150

## 2024-03-21 MED FILL — Lidocaine HCl Local Preservative Free (PF) Inj 2%: INTRAMUSCULAR | Qty: 14 | Status: AC

## 2024-03-21 MED FILL — Potassium Chloride Inj 2 mEq/ML: INTRAVENOUS | Qty: 40 | Status: AC

## 2024-03-21 MED FILL — Potassium Chloride Inj 2 mEq/ML: INTRAVENOUS | Qty: 60 | Status: AC

## 2024-03-21 MED FILL — Magnesium Sulfate Inj 50%: INTRAMUSCULAR | Qty: 10 | Status: AC

## 2024-03-21 MED FILL — Electrolyte-R (PH 7.4) Solution: INTRAVENOUS | Qty: 6000 | Status: AC

## 2024-03-21 MED FILL — Lidocaine HCl Local Soln Prefilled Syringe 100 MG/5ML (2%): INTRAMUSCULAR | Qty: 10 | Status: AC

## 2024-03-21 MED FILL — Heparin Sodium (Porcine) Inj 1000 Unit/ML: Qty: 1000 | Status: AC

## 2024-03-21 MED FILL — Electrolyte-R (PH 7.4) Solution: INTRAVENOUS | Qty: 5000 | Status: AC

## 2024-03-21 MED FILL — Calcium Chloride Inj 10%: INTRAVENOUS | Qty: 10 | Status: AC

## 2024-03-21 MED FILL — Albumin, Human Inj 5%: INTRAVENOUS | Qty: 250 | Status: AC

## 2024-03-21 MED FILL — Sodium Chloride IV Soln 0.9%: INTRAVENOUS | Qty: 3000 | Status: AC

## 2024-03-21 NOTE — Progress Notes (Signed)
 4 Days Post-Op Procedure(s) (LRB): CORONARY ARTERY BYPASS GRAFTING TIMES THREE , USING LEFT INTERNAL MAMMARY ARTERY AND ENDOSCOPIC HARVESTED RIGHT SAPHENOUS VEIN (N/A) ECHOCARDIOGRAM, TRANSESOPHAGEAL, INTRAOPERATIVE (N/A) REPAIR, AORTIC DISSECTION, ASCENDING USING 30 MM HEMASHIELD PLATINUM VASCULAR GRAFT RESUSPENSION OF AORTIC VALVE Subjective: More sedated this AM, but grips weakly bilaterally  Objective: Vital signs in last 24 hours: Temp:  [97.4 F (36.3 C)-101.1 F (38.4 C)] 101.1 F (38.4 C) (08/08 0715) Pulse Rate:  [97-106] 102 (08/08 0715) Cardiac Rhythm: Normal sinus rhythm;Sinus tachycardia (08/07 2000) Resp:  [12-36] 24 (08/08 0715) SpO2:  [93 %-100 %] 96 % (08/08 0715) Arterial Line BP: (107-166)/(39-64) 143/48 (08/08 0715) FiO2 (%):  [40 %-50 %] 50 % (08/08 0400) Weight:  [77.9 kg] 77.9 kg (08/08 0400)  Hemodynamic parameters for last 24 hours: CVP:  [2 mmHg-21 mmHg] 9 mmHg  Intake/Output from previous day: 08/07 0701 - 08/08 0700 In: 2002.5 [I.V.:475.9; NG/GT:1506.6; IV Piggyback:20] Out: 3915 [Urine:3215; Chest Tube:700] Intake/Output this shift: No intake/output data recorded.  General appearance: cooperative and slowed mentation Neurologic: diffusely weak Heart: regular rate and rhythm Lungs: clear to auscultation bilaterally Abdomen: normal findings: soft, non-tender  Lab Results: Recent Labs    03/20/24 0413 03/21/24 0402  WBC 8.4 6.9  HGB 9.9* 9.3*  HCT 30.5* 28.5*  PLT 48* 41*   BMET:  Recent Labs    03/20/24 0413 03/21/24 0402  NA 139 142  K 3.9 3.6  CL 108 110  CO2 26 25  GLUCOSE 161* 145*  BUN 49* 52*  CREATININE 1.11 0.90  CALCIUM  7.8* 7.8*    PT/INR:  Recent Labs    03/19/24 0222  LABPROT 18.9*  INR 1.5*   ABG    Component Value Date/Time   PHART 7.409 03/17/2024 2158   HCO3 20.7 03/17/2024 2158   TCO2 22 03/17/2024 2158   ACIDBASEDEF 3.0 (H) 03/17/2024 2158   O2SAT 68.9 03/21/2024 0523   CBG (last 3)  Recent Labs     03/20/24 1937 03/20/24 2303 03/21/24 0354  GLUCAP 84 128* 134*    Assessment/Plan: S/P Procedure(s) (LRB): CORONARY ARTERY BYPASS GRAFTING TIMES THREE , USING LEFT INTERNAL MAMMARY ARTERY AND ENDOSCOPIC HARVESTED RIGHT SAPHENOUS VEIN (N/A) ECHOCARDIOGRAM, TRANSESOPHAGEAL, INTRAOPERATIVE (N/A) REPAIR, AORTIC DISSECTION, ASCENDING USING 30 MM HEMASHIELD PLATINUM VASCULAR GRAFT RESUSPENSION OF AORTIC VALVE POD # 4 NEURO- less alert but does follow commands, very weak  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 Gi source Gi- on tube feedings  Increase Protonix  to 40 mg BID ID- fever to 101.1  Start antibiotics- will d/w CCM ENDO- CBG well preserved Thrombocytopenia- PLT 41K  No enoxaparin  SCD for DVT prophylaxis  LOS: 11 days    Alan [name] 03/21/2024

## 2024-03-21 NOTE — Progress Notes (Signed)
 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  Admitted 7/28 w/ chest pain. Initially started 7/26 went to ER and CEs and EKG neg so went home but CP persisted so returned.  In ER on 28th + trop I, new T wave inversion in anterior lateral leads.  Treated w/ supplemental oxygen, morphine , started on heparin  gtt.  Went to cath lab  Findings:  99% -80% in-stent restenosis of LAD balloon angioplasty completed w/ partial restoration of flow (this re-stenosed stent was felt culprit lesion). RCA 45 to 70% stenosed, cx was 40%. Had nml LVEDP, no AS, referred to CVTS for CABG given 3V disease. and placed on plavix . EF from ECHO 30-35% w/ left regional wall motion abnormality.   Went to OR 8/4 for planned CABG x 3, as well as repair of ascending aortic dissection w/ vascular graft and repair of aortic valve  Time on bypass: ~ 4h28min EBL 1390 Cell saver 910 Received: FFP X2, PLTs 1 unit PRBC and cryo for on going surgical oozing Arrived in ICU w 480 ml bloody outpt from Chest tube Surgical team at bedside on arrival  Shortly after arrival to ICU had runs of VT and AF w/ RVR. Amiodarone  bolus and gtt initiated.  Got 2 gms calcium  another 2 units PLTs. TXA  Pertinent  Medical History  Prior CAD w/ DES X 2 and prior minimally invasive MVR 2015, NSCLC q/ mets to bone (2023) s/p chemo and XRT w/ most recent scans showing stable non-active disease  NSTEMI, HLD Significant Hospital Events: Including procedures, antibiotic start and stop dates in addition to other pertinent events   7/28 admitted w/ CP and acute inf/lat MI, cardiac cath svr 3V disease w/ re-stenosed LAD stent and  incomplete restoration of coronary flow. EF 30-35% referred for CABG 8/4 to OR. CABG x 3 v but as completing CABG noted blood loss and intra-op ECHO showed dissection of Aorta. So underwent AV graft placement and AVR. Large EBL in OR received several blood products. Immediate post op w/ large vol CT output, runs of VT and SVT. Got 2gms calcium , amiodarone  and placed on overdrive pacing via pacer critical care at bedside assisting w/ support  8/5 myoclonus; normal head CT& CTA, EEG started. Loaded with keppra .  8/6 remained on EEG, weaned off propofol   Interim History / Subjective:  Tmax 101.3 this morning. Less interactive today.   Objective    Blood pressure (!) 140/52, pulse 100, temperature (!) 100.9 F (38.3 C), resp. rate 20, height 6' 4 (1.93 m), weight 77.9 kg, SpO2 97%. CVP:  [2 mmHg-21 mmHg] 8 mmHg  Vent Mode: PRVC FiO2 (%):  [40 %-50 %] 50 % Set Rate:  [16 bmp] 16 bmp Vt Set:  [620 mL] 620 mL PEEP:  [5 cmH20] 5 cmH20 Plateau Pressure:  [18 cmH20-26 cmH20] 18 cmH20   Intake/Output Summary (Last 24 hours) at 03/21/2024 0713 Last data filed at 03/21/2024 0500 Gross per 24 hour  Intake 1852.57 ml  Output 3695 ml  Net -1842.43 ml   Filed Weights   03/19/24 0500 03/20/24 0500 03/21/24 0400  Weight: 84.1 kg 87.2 kg 77.9 kg  Examination: General:  critically ill appearing man lying in bed in NAD HENT: [place]/AT, eyes anicteric Lungs: breathing comfortably on 8/5, air leak from chest tube, minimal fluid output. Rhales on the left more than R. Thick tan sputum. Cardiovascular: S1S2, RRR Abdomen: soft, NT Extremities: edema, bruising on extremities Neuro: More lethargic today, wakes up to stimulation, following commands more sluggishly today. Very weak. RASS -1. GU: yellow urine  BUN 52 Cr 0.9 WBC 6.9 H/H 9.3/28.5 Platelets 41 Coox 69%  CXR personally reviewed>R base improved but still increased opacification, scar mid-R lung from previous radiation. Small apical left  pneumothorax- stable.  Pulmonary edema.   Echo: LVEF 30-35%, moderate MS, dilated IVC with reduced variability.    Resolved problem list   Assessment and Plan   3V CAD and restenosis of DES now s/p CABG x 3 V w/ intraoperative finding of ascending aorta dissection requiring aortic graft and AVR Acute on chronic HFrEF due to ICM with cardiogenic shock. H/o HLD, HLD and CAD w/ severe mitral valve disease. Prior stents and mini- mitral valve repair (2015) for severe MR. 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 Mental status precludes extubation due to degree of lethargy. Keep sedation off.  -PAD protocol -VAP prevention protocol -trach aspirate culture, MRSA nares -empirically starting levofloxacin  and vanc; h/o anaphylaxis to PCN  Small left pneumothorax > enlarging slightly, residual right hemothorax> resolved -keep left chest tube -mediastinal tubes out per TCTS  H/o RLL stage IV lung adenocarcinoma w/ bone mets. Last PET w/ no active disease- in remission since 2023. Possible new sclerotic lesion on CXR with small LUL nodule. -eventually needs CT for  further evaluation   Hyperglycemia; A1c 5.0 -con't semglee  24 units daily -con't aspart TF coverage 3 units q4h -SSI PRN -goal BG 140-180  At risk for malnutrition -TF -cortrak today  Plan discussed with Neuro, AHF, TCTS.  Wife will be updated this afternoon when she arrives.  Best Practice (right click and Reselect all SmartList Selections daily)   Diet/type: tubefeeds DVT prophylaxis SCD Pressure ulcer(s): N/A GI prophylaxis: PPI Lines: Central line, Arterial Line, and yes and it is still needed Foley:  Yes, and it is still needed Code Status:  full code Last date of multidisciplinary goals of care discussion [per primary ]  Labs   CBC: Recent Labs  Lab 03/18/24 2047 03/19/24 0222 03/19/24 1000 03/20/24 0413 03/21/24 0402  WBC 12.8* 12.9* 9.1 8.4 6.9  HGB 7.9* 7.6* 9.8* 9.9* 9.3*  HCT 23.2* 21.9* 28.4* 30.5* 28.5*  MCV 88.9 90.1 89.3 92.1 93.1  PLT 114* 101* 67* 48* 41*    Basic Metabolic Panel: Recent Labs  Lab 03/18/24 1130 03/18/24 1641 03/19/24 0226 03/19/24 1000 03/20/24 0413 03/21/24 0402  NA 141 141 139 139 139 142  K 4.5 4.2  4.0 4.2 3.9 3.6  CL 109 108 107 108 108 110  CO2 22 23 22 22 26 25   GLUCOSE 133* 119* 161* 171* 161* 145*  BUN 18 24* 34* 40* 49* 52*  CREATININE 1.06 1.19 1.17 1.18 1.11 0.90  CALCIUM  8.0* 7.7* 7.8* 7.8* 7.8* 7.8*  MG 2.6* 2.5* 2.6*  --  2.6* 2.6*  PHOS 4.7*  --  4.4  --  2.9 2.6    ABG    Component Value Date/Time   PHART 7.409 03/17/2024 2158   PCO2ART 32.7 03/17/2024 2158   PO2ART 80 (L) 03/17/2024 2158   HCO3 20.7 03/17/2024 2158   TCO2 22 03/17/2024 2158   ACIDBASEDEF 3.0 (H) 03/17/2024 2158   O2SAT 68.9 03/21/2024 0523     Coagulation Profile: Recent Labs  Lab 03/16/24 0410 03/17/24 1446 03/17/24 1615 03/19/24 0222  INR 1.0 2.3* 1.8* 1.5*     Critical care time:    This patient is critically ill with multiple organ system failure which requires frequent high complexity decision making,  assessment, support, evaluation, and titration of therapies. This was completed through the application of advanced monitoring technologies and extensive interpretation of multiple databases. During this encounter critical care time was devoted to patient care services described in this note for 40 minutes.   Leita SHAUNNA Gaskins, DO 03/21/24 10:04 AM [facility]  For contact information, see Amion. If no response to pager, please call PCCM consult pager. After hours, 7PM- 7AM, please call Elink.

## 2024-03-21 NOTE — Progress Notes (Addendum)
 Advanced Heart Failure Rounding Note  Cardiologist: [name], MD   Chief Complaint: Post-op CABG and Bentall procedure   Subjective:    POD #4  Restarted on milrinone  0.125 yesterday evening for low co-ox (52%) and to assist w/ diuresis. No pressor requirements.   Co-ox 69% today 3.2 L in UOP yesterday. Net negative 1.9L. CVP 12  SCr 1.1>>0.9 K 3.6   Plts 67>>48>>41K. Hgb 9.3 (stable). No gross bleeding    Remains intubated. Folling commands but more lethargic today. Febrile overnight, mTemp 101.1. WBC 6.9. Thick ETT secretions. MAP 72    Objective:   Weight Range: 77.9 kg Body mass index is 20.9 kg/m.   Vital Signs:   Temp:  [97.4 F (36.3 C)-100.9 F (38.3 C)] 100.9 F (38.3 C) (08/08 0615) Pulse Rate:  [97-106] 100 (08/08 0615) Resp:  [12-36] 20 (08/08 0615) SpO2:  [93 %-100 %] 97 % (08/08 0615) Arterial Line BP: (107-158)/(39-64) 130/43 (08/08 0615) FiO2 (%):  [40 %-50 %] 50 % (08/08 0400) Weight:  [77.9 kg] 77.9 kg (08/08 0400) Last BM Date : 03/15/24  Weight change: Filed Weights   03/19/24 0500 03/20/24 0500 03/21/24 0400  Weight: 84.1 kg 87.2 kg 77.9 kg    Intake/Output:   Intake/Output Summary (Last 24 hours) at 03/21/2024 0717 Last data filed at 03/21/2024 0500 Gross per 24 hour  Intake 1852.57 ml  Output 3695 ml  Net -1842.43 ml      Physical Exam    CVP 12  General:  critically ill appearing, intubated. Lethargic, slow to follow commands  Neck: RIJ CVC  Cor: RRR, + sternal dressing Lungs: intubated, + b/l CTs  Abdomen: soft, NT, ND  Extremities: b/l distal ext cool, trace b/l LEE  Neuro: lethargic, following commands but slow response  GU + Foley    Telemetry   Sinus tach, low 100s, personally reviewed   Labs    CBC Recent Labs    03/20/24 0413 03/21/24 0402  WBC 8.4 6.9  HGB 9.9* 9.3*  HCT 30.5* 28.5*  MCV 92.1 93.1  PLT 48* 41*   Basic Metabolic Panel Recent Labs    91/92/74 0413 03/21/24 0402  NA 139  142  K 3.9 3.6  CL 108 110  CO2 26 25  GLUCOSE 161* 145*  BUN 49* 52*  CREATININE 1.11 0.90  CALCIUM  7.8* 7.8*  MG 2.6* 2.6*  PHOS 2.9 2.6   Liver Function Tests Recent Labs    03/19/24 1000  AST 243*  ALT 361*  ALKPHOS 71  BILITOT 2.2*  PROT 4.4*  ALBUMIN  2.5*   No results for input(s): LIPASE, AMYLASE in the last 72 hours. Cardiac Enzymes No results for input(s): CKTOTAL, CKMB, CKMBINDEX, TROPONINI in the last 72 hours.  BNP: BNP (last 3 results) No results for input(s): BNP in the last 8760 hours.  ProBNP (last 3 results) No results for input(s): PROBNP in the last 8760 hours.   D-Dimer No results for input(s): DDIMER in the last 72 hours. Hemoglobin A1C No results for input(s): HGBA1C in the last 72 hours. Fasting Lipid Panel Recent Labs    03/19/24 0600  TRIG 95   Thyroid  Function Tests No results for input(s): TSH, T4TOTAL, T3FREE, THYROIDAB in the last 72 hours.  Invalid input(s): FREET3  Other results:   Imaging    ECHOCARDIOGRAM LIMITED Result Date: 03/20/2024    ECHOCARDIOGRAM LIMITED REPORT   Patient Name:   Alan [name] Date of Exam: 03/20/2024 Medical Rec #:  [id number]        Height:       76.0 in Accession #:    [id number]       Weight:       192.2 lb Date of Birth:  1956/05/14         BSA:          2.178 m Patient Age:    68 years         BP:           140/52 mmHg Patient Gender: M                HR:           98 bpm. Exam Location:  Inpatient Procedure: Limited Echo, Cardiac Doppler, Color Doppler and Intracardiac            Opacification Agent (Both Spectral and Color Flow Doppler were            utilized during procedure). Indications:    I50.40* Unspecified combined systolic (congestive) and diastolic                 (congestive) heart failure  History:        Patient has prior history of Echocardiogram examinations, most                 recent 03/11/2024. Previous Myocardial Infarction and CAD,                  Abnormal ECG and 3 days post CABG, Arrythmias:SVT; Risk                 Factors:Hypertension and Dyslipidemia. Lung cancer.  Sonographer:    Ellouise Mose RDCS Referring Phys: [id number] [facility] NICOLE [facility]  Sonographer Comments: Technically difficult study due to poor echo windows and echo performed with patient supine and on artificial respirator. Patient on vent with chest tube and surgical dressings in subc region. IMPRESSIONS  1. Left ventricular ejection fraction, by estimation, is 30 to 35%. The left ventricle has moderately decreased function. The left ventricle demonstrates regional wall motion abnormalities (see scoring diagram/findings for description).  2. The mitral valve is degenerative. Moderate mitral stenosis. The mean mitral valve gradient is 6.0 mmHg. Severe mitral annular calcification.  3. The aortic valve was not well visualized.  4. Tricuspid regurgitation signal is inadequate for assessing PA pressure.  5. The inferior vena cava is dilated in size with <50% respiratory variability, suggesting right atrial pressure of 15 mmHg. FINDINGS  Left Ventricle: Left ventricular ejection fraction, by estimation, is 30 to 35%. The left ventricle has moderately decreased function. The left ventricle demonstrates regional wall motion abnormalities. Definity  contrast agent was given IV to delineate the left ventricular endocardial borders. Abnormal (paradoxical) septal motion, consistent with left bundle branch block.  LV Wall Scoring: The mid and distal anterior septum, entire apex, and mid inferoseptal segment are akinetic. The anterior wall, antero-lateral wall, inferior wall, posterior wall, basal anteroseptal segment, and basal inferoseptal segment are normal. Right Ventricle: Tricuspid regurgitation signal is inadequate for assessing PA pressure. Mitral Valve: The mitral valve is degenerative in appearance. There is moderate thickening of the mitral valve leaflet(s). There is moderate calcification of the  mitral valve leaflet(s). Severe mitral annular calcification. Moderate mitral valve stenosis. MV peak gradient, 11.4 mmHg. The mean mitral valve gradient is 6.0 mmHg. Aortic Valve: The aortic valve was not well visualized. Venous: The inferior vena cava is dilated in size with less than 50% respiratory  variability, suggesting right atrial pressure of 15 mmHg. Additional Comments: Spectral Doppler performed. Color Doppler performed.  LEFT VENTRICLE PLAX 2D LVIDd:         4.00 cm LVIDs:         2.80 cm LV PW:         1.30 cm LV IVS:        1.00 cm  LV Volumes (MOD) LV vol d, MOD A2C: 77.3 ml LV vol d, MOD A4C: 105.0 ml LV vol s, MOD A2C: 49.9 ml LV vol s, MOD A4C: 57.8 ml LV SV MOD A2C:     27.4 ml LV SV MOD A4C:     105.0 ml LV SV MOD BP:      34.8 ml IVC IVC diam: 2.60 cm AORTIC VALVE LVOT Vmax:   70.40 cm/s LVOT Vmean:  46.900 cm/s LVOT VTI:    0.116 m MITRAL VALVE MV Area (PHT): 4.21 cm     SHUNTS MV Peak grad:  11.4 mmHg    Systemic VTI: 0.12 m MV Mean grad:  6.0 mmHg MV Vmax:       1.69 m/s MV Vmean:      119.0 cm/s MV Decel Time: 180 msec MV E velocity: 128.00 cm/s MV A velocity: 152.00 cm/s MV E/A ratio:  0.84 Wilbert Bihari MD Electronically signed by Wilbert Bihari MD Signature Date/Time: 03/20/2024/5:28:16 PM    Final    DG CHEST PORT 1 VIEW Result Date: 03/20/2024 CLINICAL DATA:  Intubated EXAM: PORTABLE CHEST 1 VIEW COMPARISON:  03/20/2024 FINDINGS: A single frontal view of the chest demonstrates endotracheal tube overlying tracheal air column, tip 3.5 cm above carina. The enteric catheter passes below diaphragm, tip excluded by collimation. Right internal jugular Cordis tip overlies superior vena cava. Stable mediastinal drain and left chest tube. Postsurgical changes from median sternotomy, bypass, and thoracic aortic dissection repair. Stable cardiac silhouette. Persistent right pleural effusion. Stable bibasilar consolidation, right greater than left. There is a new small right apical pneumothorax,  volume estimated less than 5%. Pleural separation measures 1.7 cm. There is a stable left apical pneumothorax, pleural separation measuring 1.3 cm, also estimated less than 5%. IMPRESSION: 1. New right apical pneumothorax, volume estimated less than 5%. No tension effect. 2. Stable small left apical pneumothorax, volume estimated less than 5%. No tension effect. 3. Stable right pleural effusion and bibasilar consolidation. 4. Support devices as above. These results were called by telephone at the time of interpretation on 03/20/2024 at 5:19 pm to the patient's nurse, [place], who verbally acknowledged these results. Electronically Signed   By: Ozell Daring M.D.   On: 03/20/2024 17:24   MR BRAIN WO CONTRAST Result Date: 03/20/2024 CLINICAL DATA:  Neuro deficit, acute stroke suspected EXAM: MRI HEAD WITHOUT CONTRAST TECHNIQUE: Multiplanar, multiecho pulse sequences of the brain and surrounding structures were obtained without intravenous contrast. COMPARISON:  July 06, 2023 FINDINGS: MRI brain: There are 3 small foci of restricted diffusion in the right parietal lobe. There is a punctate focus of restricted diffusion in the right frontal white matter. There is a small left posterior frontal cortical infarct. There is a punctate area of restricted diffusion in the left occipital lobe. There are multiple small foci of magnetic susceptibility in both hemispheres, left side of the thalamus and the cerebellum. The left thalamic lesion is new and these abnormalities are more numerous than on the prior study. The ventricles are normal. No mass lesion. There are normal flow signals in the carotid arteries and  basilar artery. No significant bone marrow signal abnormality. No significant abnormality in the paranasal sinuses or soft tissues. IMPRESSION: 1. There are multiple small infarcts involving different vascular territories and both hemispheres suggesting a cardiac/systemic source of embolism 2. There are multiple small  foci of chronic microhemorrhage. These are more numerous than on the prior MRI from July 06, 2023 Electronically Signed   By: Nancyann Burns M.D.   On: 03/20/2024 11:45     Medications:     Scheduled Medications:  sodium chloride    Intravenous Once   sodium chloride    Intravenous Once   acetaminophen   1,000 mg Oral Q6H   Or   acetaminophen  (TYLENOL ) oral liquid 160 mg/5 mL  1,000 mg Per Tube Q6H   aspirin  EC  325 mg Oral Daily   Or   aspirin   324 mg Per Tube Daily   atorvastatin   80 mg Per Tube Daily   bisacodyl   10 mg Oral Daily   Or   bisacodyl   10 mg Rectal Daily   Chlorhexidine  Gluconate Cloth  6 each Topical Daily   docusate  100 mg Per Tube BID   feeding supplement (PROSource TF20)  60 mL Per Tube BID   insulin  aspart  0-24 Units Subcutaneous Q4H   insulin  aspart  3 Units Subcutaneous Q4H   insulin  glargine-yfgn  24 Units Subcutaneous Daily   levETIRAcetam   1,000 mg Intravenous Q12H   mouth rinse  15 mL Mouth Rinse Q2H   pantoprazole  (PROTONIX ) IV  40 mg Intravenous QHS   polyethylene glycol  17 g Per Tube Daily   potassium chloride   20 mEq Per Tube Q4H   sodium chloride  flush  10-40 mL Intracatheter Q12H   sodium chloride  flush  3 mL Intravenous Q12H    Infusions:  albumin  human Stopped (03/18/24 0335)   amiodarone  30 mg/hr (03/21/24 0500)   feeding supplement (VITAL 1.5 CAL) 55 mL/hr at 03/21/24 0500   milrinone  0.125 mcg/kg/min (03/21/24 0500)   norepinephrine  (LEVOPHED ) Adult infusion Stopped (03/19/24 1013)   propofol  (DIPRIVAN ) infusion Stopped (03/19/24 1039)    PRN Medications: albumin  human, fentaNYL  (SUBLIMAZE ) injection, fentaNYL  (SUBLIMAZE ) injection, ipratropium-albuterol , metoprolol  tartrate, midazolam , ondansetron  (ZOFRAN ) IV, oxyCODONE , sodium chloride  flush    Patient Profile  68 y.o. male with history of CAD, severe MR s/p minimally invasive mitral valve repair, stage III NSCLC.   Patient admitted with NSTEMI and new systolic CHF.  Underwent 3v CABG X 3 c/b ascending aortic dissection.  Assessment/Plan   1. NSTEMI with known CAD -Remote anterior STEMI with prior stents to LAD -NSTEMI - 3 V CAD on cath with 99% in-stent restenosis in LAD, 50% residual stenosis post PTCA -CABG X 3 (LIMA to LAD, SVG to OM, SBVG to PDA) 03/17/24 as above -Aspirin  + statin.   2. Ascending Aortic Dissection  - complication of CABG - s/p Bentall and resuspenison of AoV    3. Acute systolic CHF/ICM -Newly reduced EF -Echo 03/10/24: EF 30-35%, anterior WMA, RV mildly reduced -Post CABG limited echo 8/7: EF improving, 40-45% w/ septal dyssynchrony  -On Milrinone  0.125. Co-ox 69%. CVP 12  -Continue IV Lasix  80 mg x 1 w/ K supp   4. Pulm/ Suspected PNA  -Remains intubated. Awake and following commands but too weak for extubation -Febrile w/ thick ETT secretions. Check tracheal aspirate -Start Vanc per CCM -vent management per CCM  5. Severe MR s/p minimally invasive mitral valve repair -trivial MR with mean gradient of 4 mmhg on echo this admit  6. Postop anemia/thrombocytopenia -Multiple blood products in OR and immediately post-op -Hgb improved and remains stable at 9.3  -Platelets trending down, 41K.   -Follow closely -Has not been on heparin  post-op. Holding DVT prophylaxis.  7. NSVT -No recurrence -Continue amiodarone  gtt for now.   8. NSCLC w/bone mets -S/p chemoradiation and immunotherapy -Stable disease on most recent outpatinet imaging. Followed by Heme/Onc -Possible new sclerotic lesion/small LUL nodule on CXR this admission. Will eventually require CT.  9. Myoclonic seizures - Suspect hypoxic/anoxic brain injury - Neurology following - Off sedation and following commands.  - Keppra  - MRI brain  10. Left pneumothorax - stable on CXR today  - per CT surgery keep left chest tube   CRITICAL CARE Performed by: Caffie Shed   Total critical care time: 15 minutes  Critical care time was exclusive of  separately billable procedures and treating other patients.  Critical care was necessary to treat or prevent imminent or life-threatening deterioration.  Critical care was time spent personally by me on the following activities: development of treatment plan with patient and/or surrogate as well as nursing, discussions with consultants, evaluation of patient's response to treatment, examination of patient, obtaining history from patient or surrogate, ordering and performing treatments and interventions, ordering and review of laboratory studies, ordering and review of radiographic studies, pulse oximetry and re-evaluation of patient's condition.   Length of Stay: [address], PA-C  03/21/2024, 7:17 AM  Advanced Heart Failure Team Pager [phone] (M-F; 7a - 5p)  Please contact [facility] for night-coverage after hours (5p -7a ) and weekends on [email or web address]  Agree with above.   Remains intubated/ Following command. Febrile overnight and increased secretions. On milrinone  0.125. Co-ox 69% CVP 12  General:  On vent/ will arouse  HEENT: normal + ETT Neck: supple. JVP 10. + central line Cor: RRR Lungs: + crackles Abdomen: soft, nontender, nondistended. No hepatosplenomegaly. No bruits or masses. Good bowel sounds. Extremities: no cyanosis, clubbing, rash, 1-2+ edema Neuro: awake on vent  Appears to have developing PNA. Start abx. Diurese.  Brain MRI without global anoxia. Mental status improving  Will continue milrinone  for now to facilitate diuresis.   Can switch IV amio to po   Cor-trak for feeds.   CRITICAL CARE Performed by: Cherrie Sieving  Total critical care time: 45 minutes  Critical care time was exclusive of separately billable procedures and treating other patients.  Critical care was necessary to treat or prevent imminent or life-threatening deterioration.  Critical care was time spent personally by me (independent of midlevel providers or residents)  on the following activities: development of treatment plan with patient and/or surrogate as well as nursing, discussions with consultants, evaluation of patient's response to treatment, examination of patient, obtaining history from patient or surrogate, ordering and performing treatments and interventions, ordering and review of laboratory studies, ordering and review of radiographic studies, pulse oximetry and re-evaluation of patient's condition.   Sieving Cherrie, MD  9:51 AM

## 2024-03-21 NOTE — Progress Notes (Signed)
 Pharmacy Antibiotic Note  Alan [name] is a 68 y.o. male admitted on 03/10/2024 with pneumonia.  Pharmacy has been consulted for Vancomycin  dosing.  WBC 6.9, febrile to 101.22F. When suctioned at bedise, MD found thick tan sputum. Remote history of anaphylaxis to penicillins, CCM MD prefers to avoid cefepime at this time and cover pseudomonas with levofloxacin .   Plan: Vancomycin  1,750mg  IV load x1 Start vancomcyin 1,000 IV q12h (Scr 0.9, Vd 0.72, eAUC 462) Levofloxacin  per MD Monitor levels as indicated, renal function, culture data   Height: 6' 4 (193 cm) Weight: 77.9 kg (171 lb 11.8 oz) IBW/kg (Calculated) : 86.8  Temp (24hrs), Avg:100.4 F (38 C), Min:97.4 F (36.3 C), Max:101.1 F (38.4 C)  Recent Labs  Lab 03/18/24 1641 03/18/24 2047 03/19/24 0222 03/19/24 0226 03/19/24 1000 03/20/24 0413 03/21/24 0402  WBC 11.4* 12.8* 12.9*  --  9.1 8.4 6.9  CREATININE 1.19  --   --  1.17 1.18 1.11 0.90    Estimated Creatinine Clearance: 87.8 mL/min (by C-G formula based on SCr of 0.9 mg/dL).    Allergies  Allergen Reactions   A-Cillin [Ampicillin] Shortness Of Breath, Swelling and Rash    Did it involve swelling of the face/tongue/throat, SOB, or low BP? Yes Did it involve sudden or severe rash/hives, skin peeling, or any reaction on the inside of your mouth or nose? Yes Did you need to seek medical attention at a hospital or doctor's office? Yes When did it last happen? ~20 years ago If all above answers are NO, may proceed with cephalosporin use.     Amoxicillin Hives, Shortness Of Breath and Swelling    Did it involve swelling of the face/tongue/throat, SOB, or low BP? Yes Did it involve sudden or severe rash/hives, skin peeling, or any reaction on the inside of your mouth or nose? Yes Did you need to seek medical attention at a hospital or doctor's office? Yes When did it last happen? ~20 years ago If all above answers are NO, may proceed with cephalosporin use.     Other Hives, Shortness Of Breath and Swelling    ALL CILLINS   Penicillins Hives, Shortness Of Breath and Swelling    Did it involve swelling of the face/tongue/throat, SOB, or low BP? Yes Did it involve sudden or severe rash/hives, skin peeling, or any reaction on the inside of your mouth or nose? Yes Did you need to seek medical attention at a hospital or doctor's office? Yes When did it last happen? ~20 years ago If all above answers are NO, may proceed with cephalosporin use.     Sulfa Antibiotics Nausea Only   Crestor  [Rosuvastatin ] Other (See Comments)    G I upset    Antimicrobials this admission: Levofloxacin  perioperatively Vancomycin  perioperatively Vancomycin  8/8 >> Levofloxacin  8/8 >>  Microbiology results: 8/8 Sputum: sent  8/8 MRSA PCR: sent  Thank you for allowing pharmacy to be a part of this patient's care.  Aileena Iglesia B Kao Berkheimer 03/21/2024 7:54 AM

## 2024-03-21 NOTE — Progress Notes (Signed)
 Pt was flipped to PS/CPAP 8/5 50% by CCM. Culture Sputum sample obtained by this RT and sent to lab at this time

## 2024-03-21 NOTE — Progress Notes (Signed)
 Nutrition Follow Up  DOCUMENTATION CODES:   Non-severe (moderate) malnutrition in context of chronic illness (decreased appetite related to worsening heart issues)  INTERVENTION:   Continue tube feeding via Cortrak tube: Vital 1.5 at 55 ml/h (1320 ml per day) Prosource TF20 60 ml BID Provides 2140 kcal, 129 gm protein, 1008 ml free water daily.  NUTRITION DIAGNOSIS:   Moderate Malnutrition related to chronic illness (decreased appetite related to worsening heart issues) as evidenced by mild fat depletion, mild muscle depletion, energy intake < 75% for > or equal to 1 month. New diagnosis  GOAL:   Patient will meet greater than or equal to 90% of their needs Met with tube feeds at goal rate  MONITOR:   TF tolerance, Vent status, Weight trends  REASON FOR ASSESSMENT:   Consult Enteral/tube feeding initiation and management  ASSESSMENT:   68 yo male admitted with NSTEMI. PMH includes metastatic lung cancer (in remission), CAD, STEMI 2004, MVP, MVR, prior stents and mini - mitral valve repair (2015), HLD, HTN, GERD.  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 Pt having thick secretions, will continue with vancomycin .   Spoke with pt's wife at bedside. Pt weaned from sedation, but remains lethargic and was not stimulated by touch or voice at time of assessment. Spoke with pt's wife about Cortrak placement and continuing tube feeds to meet pt's nutrition needs. Discussed that pt's wt will be monitored for any losses to see if adjustments to tube feeds need to be made.  Pt's wife reports pt had a major drop in appetite the last 3 months leading up to admission. Pt used  to enjoy eating and always looked forward to his meals but within the last 3 months pt lost the joy in eating. Wife reports pt would not finish meals he used to love and seemed like he wasn't interested in eating anymore.   Conducted initial nutrition focused physical exam today. Mild muscle and mild fat depletions seen. Suspect depletions are related to chronic under nutrition reported by wife and not acute since pt was admitted carrying fluid which would have previously masked depletions. Pt still exhibits BLE edema and suspect current wt may not reflect actual wt. Pt's wife reports pt's UBW the last 6 months around 170# but prior to last year, pt's typical wt was around 190#. Per chart review, pt weighed around 180# 06/2023 which indicates 10 # wt loss in 9 months which is not significant for the timeframe but further loss may put pt at risk for increased muscle and fat depletions.   Patient is currently intubated on ventilator support MV: 14.2 L/min Temp (24hrs), Avg:100.5 F (38.1 C), Min:99.9 F (37.7 C), Max:101.8 F (38.8 C) MAP (a-line):  Admit weight: 79 kg  Current weight: 77.9 kg   Intake/Output Summary (Last 24 hours) at 03/21/2024 1251 Last data filed at 03/21/2024 1200 Gross per 24 hour  Intake 2371.49 ml  Output 3815 ml  Net -1443.51 ml   Net IO Since Admission: 11,720.68 mL [03/21/24 1251]  Drains/Lines: Cortrak gastric Arterial Line R radial Double Lumen internal jugular R Chest tube: UOP: 3215 mL  Nutritionally Relevant Medications: Scheduled Meds:  atorvastatin   80 mg Per Tube Daily  bisacodyl   10 mg Oral Daily   bisacodyl   10 mg Rectal Daily   docusate  100 mg Per Tube BID   feeding supplement (PROSource TF20)  60 mL Per Tube BID   furosemide   60 mg Intravenous Once   insulin  aspart  0-24 Units Subcutaneous Q4H   insulin  aspart  3 Units Subcutaneous Q4H   insulin  glargine-yfgn  24 Units Subcutaneous Daily   polyethylene glycol  17 g Per Tube  Daily   potassium chloride   20 mEq Per Tube Q4H   Continuous Infusions:  famotidine  (PEPCID ) IV     feeding supplement (VITAL 1.5 CAL) 55 mL/hr at 03/21/24 0800   milrinone  0.125 mcg/kg/min (03/21/24 0800)   norepinephrine  (LEVOPHED ) Adult infusion Stopped (03/19/24 1013)   propofol  (DIPRIVAN ) infusion Stopped (03/19/24 1039)   vancomycin      Labs Reviewed: Magnesium  2.6 BUN 52 CBG ranges from 84-134 mg/dL over the last 24 hours HgbA1c 5.0   Admit weight: 79 kg Current weight: 85.3 kg  NUTRITION - FOCUSED PHYSICAL EXAM:  Flowsheet Row Most Recent Value  Orbital Region Mild depletion  Upper Arm Region Mild depletion  Thoracic and Lumbar Region Mild depletion  Buccal Region Unable to assess  [vent]  Temple Region Moderate depletion  Clavicle Bone Region Mild depletion  Clavicle and Acromion Bone Region Mild depletion  Scapular Bone Region Mild depletion  Dorsal Hand Mild depletion  Patellar Region Unable to assess  [bilateral edema]  Anterior Thigh Region Unable to assess  [bilateral edema]  Posterior Calf Region Unable to assess  [bilateral edema]  Edema (RD Assessment) Mild  [BLE]  Hair Reviewed  Eyes Unable to assess  Mouth Unable to assess  Skin Reviewed  Nails Reviewed   Diet Order:   Diet Order     None       EDUCATION NEEDS:   Not appropriate for education at this time  Skin:  Skin Assessment: Skin Integrity Issues: Skin Integrity Issues:: Stage I, Incisions, Other (Comment) Stage I: R heel Incisions: surgical incisions to chest, R leg, L groin Other: R wrist puncture site for cardiac cath  Last BM:  8/2  Height:   Ht Readings from Last 1 Encounters:  03/21/24 6' 4 (1.93 m)    Weight:   Wt Readings from Last 1 Encounters:  03/21/24 77.9 kg    Ideal Body Weight:  91.8 kg  BMI:  Body mass index is 20.9 kg/m.  Estimated Nutritional Needs:   Kcal:  2000-2200  Protein:  120-135 gm  Fluid:  2-2.2 L   Josette Glance, MS, RDN,  LDN Clinical Dietitian I Please reach out via secure chat

## 2024-03-21 NOTE — Procedures (Signed)
 Cortrak  Person Inserting Tube:  Mady Dolly, RD Tube Type:  Cortrak - 43 inches Tube Size:  10 Tube Location:  Left nare Initial Placement:  Stomach Secured by: Bridle Technique Used to Measure Tube Placement:  Marking at nare/corner of mouth Cortrak Secured At:  82 cm   Cortrak Tube Team Note:  Consult received to place a Cortrak feeding tube.   No x-ray is required. RN may begin using tube.   If the tube becomes dislodged please keep the tube and contact the Cortrak team at [email or web address] for replacement.  If after hours and replacement cannot be delayed, place a NG tube and confirm placement with an abdominal x-ray.    Dolly Mady MS, RD, LDN Registered Dietitian Clinical Nutrition RD Inpatient Contact Info in Amion

## 2024-03-21 NOTE — Progress Notes (Signed)
 Peripherally Inserted Central Catheter Placement  The IV Nurse has discussed with the patient and/or persons authorized to consent for the patient, the purpose of this procedure and the potential benefits and risks involved with this procedure.  The benefits include less needle sticks, lab draws from the catheter, and the patient may be discharged home with the catheter. Risks include, but not limited to, infection, bleeding, blood clot (thrombus formation), and puncture of an artery; nerve damage and irregular heartbeat and possibility to perform a PICC exchange if needed/ordered by physician.  Alternatives to this procedure were also discussed.  Bard Power PICC patient education guide, fact sheet on infection prevention and patient information card has been provided to patient /or left at bedside.    Consent obtained with wife  PICC Placement Documentation  PICC Triple Lumen 03/21/24 Left Brachial 46 cm 0 cm (Active)  Indication for Insertion or Continuance of Line Vasoactive infusions 03/21/24 1600  Exposed Catheter (cm) 0 cm 03/21/24 1600  Site Assessment Clean, Dry, Intact 03/21/24 1600  Lumen #1 Status Flushed;Saline locked;Blood return noted 03/21/24 1600  Lumen #2 Status Flushed;Saline locked;Blood return noted 03/21/24 1600  Lumen #3 Status Flushed;Saline locked;Blood return noted 03/21/24 1600  Dressing Type Transparent;Securing device 03/21/24 1600  Dressing Status Antimicrobial disc/dressing in place;Clean, Dry, Intact 03/21/24 1600  Line Care Connections checked and tightened 03/21/24 1600  Line Adjustment (NICU/IV Team Only) No 03/21/24 1600  Dressing Intervention New dressing;Adhesive placed at insertion site (IV team only) 03/21/24 1600  Dressing Change Due 03/28/24 03/21/24 1600       Ethyl Priestly Hemlock 03/21/2024, 4:33 PM

## 2024-03-21 NOTE — Progress Notes (Signed)
 NEUROLOGY CONSULT FOLLOW UP NOTE   Date of service: March 21, 2024 Patient Name: Alan [name] MRN:  [id number] DOB:  07/31/1956  Interval Hx/subjective  Seen and examined. Spiked fever overnight Less responsive today Vitals   Vitals:   03/21/24 0645 03/21/24 0700 03/21/24 0715 03/21/24 0800  BP:    130/60  Pulse: 98 (!) 104 (!) 102 (!) 104  Resp: (!) 22 (!) 26 (!) 24 (!) 27  Temp: (!) 101.1 F (38.4 C) (!) 101.1 F (38.4 C) (!) 101.1 F (38.4 C) (!) 101.3 F (38.5 C)  TempSrc:      SpO2: 97% 97% 96% 97%  Weight:      Height:         Body mass index is 20.9 kg/m.  Physical Exam   General: Well-developed well-nourished man, no sedation, intubated HEENT: Normocephalic atraumatic Lungs: Clear/vented Cardiovascular: Regular rate rhythm Neurological exam Intubated No sedation Difficult to arouse When I called his name, did not mouth what. Did not really follow commands Moving all 4 extremities to noxious stimulation Pupils equal round react light, no gaze preference or deviation  Medications  Current Facility-Administered Medications:    0.9 %  sodium chloride  infusion (Manually program via Guardrails IV Fluids), , Intravenous, Once, Kerrin Elspeth BROCKS, MD   0.9 %  sodium chloride  infusion (Manually program via Guardrails IV Fluids), , Intravenous, Once, Jenna Maude BRAVO, NP   acetaminophen  (TYLENOL ) tablet 1,000 mg, 1,000 mg, Oral, Q6H, 1,000 mg at 03/18/24 1208 **OR** acetaminophen  (TYLENOL ) 160 MG/5ML solution 1,000 mg, 1,000 mg, Per Tube, Q6H, Gold, Wayne E, PA-C, 1,000 mg at 03/21/24 0510   albumin  human 5 % solution 12.5 g, 250 mL, Intravenous, Q15 min PRN, Gold, Wayne E, PA-C, Stopped at 03/18/24 0335   amiodarone  (PACERONE ) tablet 200 mg, 200 mg, Oral, Daily, Bensimhon, Dashiell R, MD, 200 mg at 03/21/24 1000   aspirin  EC tablet 325 mg, 325 mg, Oral, Daily **OR** aspirin  chewable tablet 324 mg, 324 mg, Per Tube, Daily, Gold, Wayne E, PA-C, 324 mg at  03/21/24 1000   atorvastatin  (LIPITOR) tablet 80 mg, 80 mg, Per Tube, Daily, Salam, Savannah B, RPH, 80 mg at 03/21/24 1000   bisacodyl  (DULCOLAX) EC tablet 10 mg, 10 mg, Oral, Daily, 10 mg at 03/19/24 0918 **OR** bisacodyl  (DULCOLAX) suppository 10 mg, 10 mg, Rectal, Daily, Gold, Wayne E, PA-C, 10 mg at 03/21/24 1035   Chlorhexidine  Gluconate Cloth 2 % PADS 6 each, 6 each, Topical, Daily, Kerrin Elspeth BROCKS, MD, 6 each at 03/19/24 1000   docusate (COLACE) 50 MG/5ML liquid 100 mg, 100 mg, Per Tube, BID, Gretta Leita SQUIBB, DO, 100 mg at 03/21/24 1032   famotidine  (PEPCID ) IVPB 20 mg premix, 20 mg, Intravenous, Q12H, Gretta Leita SQUIBB, DO, Last Rate: 100 mL/hr at 03/21/24 1005, 20 mg at 03/21/24 1005   feeding supplement (PROSource TF20) liquid 60 mL, 60 mL, Per Tube, BID, Clark, Laura P, DO, 60 mL at 03/21/24 1000   feeding supplement (VITAL 1.5 CAL) liquid 1,000 mL, 1,000 mL, Per Tube, Continuous, Gretta Leita SQUIBB, DO, Last Rate: 55 mL/hr at 03/21/24 1101, 1,000 mL at 03/21/24 1101   fentaNYL  (SUBLIMAZE ) injection 50 mcg, 50 mcg, Intravenous, Q15 min PRN, Gretta Leita SQUIBB, DO, 50 mcg at 03/19/24 1625   fentaNYL  (SUBLIMAZE ) injection 50-200 mcg, 50-200 mcg, Intravenous, Q30 min PRN, Gretta Leita SQUIBB, DO   insulin  aspart (novoLOG ) injection 0-24 Units, 0-24 Units, Subcutaneous, Q4H, Gretta Leita SQUIBB, DO, 2 Units at 03/21/24 [phone]  insulin  aspart (novoLOG ) injection 3 Units, 3 Units, Subcutaneous, Q4H, Gretta Leita SQUIBB, DO, 3 Units at 03/21/24 9146   insulin  glargine-yfgn (SEMGLEE ) injection 24 Units, 24 Units, Subcutaneous, Daily, Gretta Leita SQUIBB, DO, 24 Units at 03/21/24 1001   ipratropium-albuterol  (DUONEB) 0.5-2.5 (3) MG/3ML nebulizer solution 3 mL, 3 mL, Nebulization, Q4H PRN, Gretta Leita P, DO   lactulose  (CHRONULAC ) 10 GM/15ML solution 30 g, 30 g, Oral, Once, Clark, Laura P, DO   levETIRAcetam  (KEPPRA ) undiluted injection 1,000 mg, 1,000 mg, Intravenous, Q12H, Keller Bounds, MD, 1,000 mg at 03/21/24 0807    levofloxacin  (LEVAQUIN ) IVPB 750 mg, 750 mg, Intravenous, Q24H, Gretta Leita SQUIBB, DO, Last Rate: 100 mL/hr at 03/21/24 1041, 750 mg at 03/21/24 1041   metoprolol  tartrate (LOPRESSOR ) injection 2.5-5 mg, 2.5-5 mg, Intravenous, Q2H PRN, Gold, Wayne E, PA-C, 5 mg at 03/21/24 1031   midazolam  (VERSED ) injection 2 mg, 2 mg, Intravenous, Q1H PRN, Gold, Wayne E, PA-C, 2 mg at 03/20/24 1047   milrinone  (PRIMACOR ) 20 MG/100 ML (0.2 mg/mL) infusion, 0.125 mcg/kg/min, Intravenous, Continuous, Colletta Manuelita Garre, PA-C, Last Rate: 3.27 mL/hr at 03/21/24 0800, 0.125 mcg/kg/min at 03/21/24 0800   norepinephrine  (LEVOPHED ) 16 mg in (0.064 mg/mL) premix infusion, 0-40 mcg/min, Intravenous, Titrated, Kerrin Elspeth BROCKS, MD, Stopped at 03/19/24 1013   ondansetron  (ZOFRAN ) injection 4 mg, 4 mg, Intravenous, Q6H PRN, Viviane Lemond BRAVO, PA-C   Oral care mouth rinse, 15 mL, Mouth Rinse, Q2H, Kerrin Elspeth BROCKS, MD, 15 mL at 03/21/24 1000   oxyCODONE  (Oxy IR/ROXICODONE ) immediate release tablet 5-10 mg, 5-10 mg, Per Tube, Q3H PRN, Emmette Fraction B, RPH, 5 mg at 03/19/24 1610   polyethylene glycol (MIRALAX  / GLYCOLAX ) packet 17 g, 17 g, Per Tube, Daily, Gretta Leita P, DO, 17 g at 03/21/24 1001   potassium chloride  (KLOR-CON ) packet 20 mEq, 20 mEq, Per Tube, Q4H, Kerrin Elspeth BROCKS, MD, 20 mEq at 03/21/24 1000   propofol  (DIPRIVAN ) 1000 MG/100ML infusion, 0-80 mcg/kg/min, Intravenous, Continuous, Gretta Leita SQUIBB, DO, Stopped at 03/19/24 1039   sodium chloride  flush (NS) 0.9 % injection 10-40 mL, 10-40 mL, Intracatheter, Q12H, Kerrin Elspeth BROCKS, MD, 40 mL at 03/21/24 1035   sodium chloride  flush (NS) 0.9 % injection 3 mL, 3 mL, Intravenous, Q12H, Gold, Wayne E, PA-C, 3 mL at 03/21/24 1036   sodium chloride  flush (NS) 0.9 % injection 3 mL, 3 mL, Intravenous, PRN, Gold, Wayne E, PA-C, 3 mL at 03/21/24 0818   vancomycin  (VANCOCIN ) IVPB 1000 mg/200 mL premix, 1,000 mg, Intravenous, LENNETTE Emmette Fraction KATHEE,  RPH  Labs and Diagnostic Imaging   CBC:  Recent Labs  Lab 03/20/24 0413 03/21/24 0402  WBC 8.4 6.9  HGB 9.9* 9.3*  HCT 30.5* 28.5*  MCV 92.1 93.1  PLT 48* 41*    Basic Metabolic Panel:  Lab Results  Component Value Date   NA 142 03/21/2024   K 3.6 03/21/2024   CO2 25 03/21/2024   GLUCOSE 145 (H) 03/21/2024   BUN 52 (H) 03/21/2024   CREATININE 0.90 03/21/2024   CALCIUM  7.8 (L) 03/21/2024   GFRNONAA >60 03/21/2024   GFRAA >60 05/12/2020   Lipid Panel:  Lab Results  Component Value Date   LDLCALC 58 03/12/2024   HgbA1c:  Lab Results  Component Value Date   HGBA1C 5.0 03/12/2024   INR  Lab Results  Component Value Date   INR 1.5 (H) 03/19/2024   APTT  Lab Results  Component Value Date   APTT 35 03/19/2024  CT Head without contrast(Personally reviewed): No acute abnormality, atrophy and chronic small vessel ischemic disease  CT angio Head and Neck with contrast(Personally reviewed): Dissection of the aortic arch and descending thoracic aorta with edema surrounding the proximal left subclavian artery, mild irregular stenosis of M1 segments of bilateral MCAs, mild left pneumothorax and bilateral pleural effusions  MRI Brain(Personally reviewed): Multiple small infarcts involving different vascular territories suggesting a central embolic source.  Multiple small foci of chronic microhemorrhage-more numerous than July 06, 2023.  No evidence of anoxic brain damage  Continuous EEG yesterday-diffuse encephalopathy.  Event button pressed on 03/19/2024 multiple times of bilateral lower extremity twitching as well as shoulder tracing without definitive EEG changes.  Those events were not epileptic.  Subcortical myoclonus can have similar appearance.   Assessment   URIEL DOWDING is a 68 y.o. male with history of CAD, hypertension, hyperlipidemia, mitral valve prolapse status postrepair, SVT status post ablation, metastatic small cell lung cancer in remission who  was originally admitted after a non-STEMI and underwent CABG which was complicated by intraoperative aortic dissection.  After sedation was held, he was noted to have a lot of twitching with LPD's on the EEG along with myoclonic status epilepticus-2 episodes.  Hypoxic/anoxic brain injury is suspected but with weaning sedation, status started to improve when he is following midline commands.  Somewhat worse in terms of mentation today-spiked a fever, suspected pneumonia..  Impression Acute ischemic infarcts in the setting of aortic dissection as a complication of CABG Toxic metabolic encephalopathy in the setting of acute pneumonia  Recommendations  Continue supportive care Continue frequent neurochecks Antiplatelet per CTS Suspect improvement in mentation with resolution of pneumonia Had a detailed discussion with PCCM and CT surgery attendings as well as patient's wife at bedside.  -- Eligio Lav, MD Neurologist [facility] Pager: [phone]    CRITICAL CARE ATTESTATION Performed by: Eligio Lav, MD Total critical care time: 33 minutes Critical care time was exclusive of separately billable procedures and treating other patients and/or supervising APPs/Residents/Students Critical care was necessary to treat or prevent imminent or life-threatening deterioration. This patient is critically ill and at significant risk for neurological worsening and/or death and care requires constant monitoring. Critical care was time spent personally by me on the following activities: development of treatment plan with patient and/or surrogate as well as nursing, discussions with consultants, evaluation of patient's response to treatment, examination of patient, obtaining history from patient or surrogate, ordering and performing treatments and interventions, ordering and review of laboratory studies, ordering and review of radiographic studies, pulse oximetry, re-evaluation of patient's  condition, participation in multidisciplinary rounds and medical decision making of high complexity in the care of this patient.

## 2024-03-22 ENCOUNTER — Inpatient Hospital Stay (HOSPITAL_COMMUNITY)

## 2024-03-22 DIAGNOSIS — I6340 Cerebral infarction due to embolism of unspecified cerebral artery: Secondary | ICD-10-CM | POA: Diagnosis not present

## 2024-03-22 DIAGNOSIS — R570 Cardiogenic shock: Secondary | ICD-10-CM | POA: Diagnosis not present

## 2024-03-22 DIAGNOSIS — Z951 Presence of aortocoronary bypass graft: Secondary | ICD-10-CM | POA: Diagnosis not present

## 2024-03-22 DIAGNOSIS — J9601 Acute respiratory failure with hypoxia: Secondary | ICD-10-CM | POA: Diagnosis not present

## 2024-03-22 DIAGNOSIS — I214 Non-ST elevation (NSTEMI) myocardial infarction: Secondary | ICD-10-CM | POA: Diagnosis not present

## 2024-03-22 DIAGNOSIS — I5021 Acute systolic (congestive) heart failure: Secondary | ICD-10-CM

## 2024-03-22 DIAGNOSIS — I7100 Dissection of unspecified site of aorta: Secondary | ICD-10-CM | POA: Diagnosis not present

## 2024-03-22 DIAGNOSIS — I639 Cerebral infarction, unspecified: Secondary | ICD-10-CM | POA: Diagnosis not present

## 2024-03-22 DIAGNOSIS — J189 Pneumonia, unspecified organism: Secondary | ICD-10-CM

## 2024-03-22 DIAGNOSIS — T82898A Other specified complication of vascular prosthetic devices, implants and grafts, initial encounter: Secondary | ICD-10-CM | POA: Diagnosis not present

## 2024-03-22 DIAGNOSIS — L8990 Pressure ulcer of unspecified site, unspecified stage: Secondary | ICD-10-CM | POA: Insufficient documentation

## 2024-03-22 DIAGNOSIS — Z9911 Dependence on respirator [ventilator] status: Secondary | ICD-10-CM

## 2024-03-22 LAB — CBC
HCT: 29.1 % — ABNORMAL LOW (ref 39.0–52.0)
Hemoglobin: 9.5 g/dL — ABNORMAL LOW (ref 13.0–17.0)
MCH: 31 pg (ref 26.0–34.0)
MCHC: 32.6 g/dL (ref 30.0–36.0)
MCV: 95.1 fL (ref 80.0–100.0)
Platelets: 50 K/uL — ABNORMAL LOW (ref 150–400)
RBC: 3.06 MIL/uL — ABNORMAL LOW (ref 4.22–5.81)
RDW: 17 % — ABNORMAL HIGH (ref 11.5–15.5)
WBC: 9.7 K/uL (ref 4.0–10.5)
nRBC: 4.6 % — ABNORMAL HIGH (ref 0.0–0.2)

## 2024-03-22 LAB — BASIC METABOLIC PANEL WITH GFR
Anion gap: 9 (ref 5–15)
BUN: 49 mg/dL — ABNORMAL HIGH (ref 8–23)
CO2: 25 mmol/L (ref 22–32)
Calcium: 7.8 mg/dL — ABNORMAL LOW (ref 8.9–10.3)
Chloride: 111 mmol/L (ref 98–111)
Creatinine, Ser: 0.82 mg/dL (ref 0.61–1.24)
GFR, Estimated: >60 mL/min (ref >60)
Glucose, Bld: 123 mg/dL — ABNORMAL HIGH (ref 70–99)
Potassium: 3.7 mmol/L (ref 3.5–5.1)
Sodium: 145 mmol/L (ref 135–145)

## 2024-03-22 LAB — COOXEMETRY PANEL
Carboxyhemoglobin: 1.8 % — ABNORMAL HIGH (ref 0.5–1.5)
Methemoglobin: <0.7 % (ref 0.0–1.5)
O2 Saturation: 78.4 %
Total hemoglobin: 9.5 g/dL — ABNORMAL LOW (ref 12.0–16.0)

## 2024-03-22 LAB — GLUCOSE, CAPILLARY
Glucose-Capillary: 113 mg/dL — ABNORMAL HIGH (ref 70–99)
Glucose-Capillary: 122 mg/dL — ABNORMAL HIGH (ref 70–99)
Glucose-Capillary: 120 mg/dL — ABNORMAL HIGH (ref 70–99)
Glucose-Capillary: 138 mg/dL — ABNORMAL HIGH (ref 70–99)
Glucose-Capillary: 129 mg/dL — ABNORMAL HIGH (ref 70–99)
Glucose-Capillary: 89 mg/dL (ref 70–99)

## 2024-03-22 LAB — TYPE AND SCREEN
ABO/RH(D): O POS
Antibody Screen: NEGATIVE

## 2024-03-22 LAB — MAGNESIUM: Magnesium: 2.6 mg/dL — ABNORMAL HIGH (ref 1.7–2.4)

## 2024-03-22 MED ORDER — FUROSEMIDE 10 MG/ML IJ SOLN
80.0000 mg | Freq: Three times a day (TID) | INTRAMUSCULAR | Status: AC
  Administered 2024-03-22: 80 mg via INTRAVENOUS
  Filled 2024-03-22: qty 8

## 2024-03-22 MED ORDER — FUROSEMIDE 10 MG/ML IJ SOLN
60.0000 mg | Freq: Three times a day (TID) | INTRAMUSCULAR | Status: DC
  Administered 2024-03-22: 60 mg via INTRAVENOUS
  Filled 2024-03-22: qty 6

## 2024-03-22 MED ORDER — SODIUM CHLORIDE 3 % IN NEBU: 4.0000 mL | INHALATION_SOLUTION | RESPIRATORY_TRACT | Status: DC

## 2024-03-22 MED ORDER — HYDRALAZINE HCL 20 MG/ML IJ SOLN
10.0000 mg | INTRAMUSCULAR | Status: DC | PRN
  Administered 2024-03-22: 20 mg via INTRAVENOUS
  Administered 2024-03-27 (×2): 10 mg via INTRAVENOUS
  Filled 2024-03-22 (×4): qty 1

## 2024-03-22 MED ORDER — POTASSIUM CHLORIDE 10 MEQ/100ML IV SOLN
10.0000 meq | INTRAVENOUS | Status: AC
  Administered 2024-03-22 (×4): 10 meq via INTRAVENOUS
  Filled 2024-03-22 (×4): qty 100

## 2024-03-22 MED ORDER — HYDRALAZINE HCL 20 MG/ML IJ SOLN: 10.0000 mg | INTRAMUSCULAR | Status: DC

## 2024-03-22 MED ORDER — POTASSIUM CHLORIDE 20 MEQ PO PACK
20.0000 meq | PACK | ORAL | Status: AC
  Administered 2024-03-22 (×3): 20 meq
  Filled 2024-03-22 (×3): qty 1

## 2024-03-22 MED ORDER — INSULIN GLARGINE-YFGN 100 UNIT/ML ~~LOC~~ SOLN
15.0000 [IU] | Freq: Every day | SUBCUTANEOUS | Status: DC
  Administered 2024-03-23 – 2024-03-25 (×5): 15 [IU] via SUBCUTANEOUS
  Filled 2024-03-22 (×4): qty 0.15

## 2024-03-22 MED ORDER — SODIUM CHLORIDE 3 % IN NEBU
4.0000 mL | INHALATION_SOLUTION | RESPIRATORY_TRACT | Status: AC
  Administered 2024-03-22 – 2024-03-25 (×21): 4 mL via RESPIRATORY_TRACT
  Filled 2024-03-22 (×14): qty 4

## 2024-03-22 NOTE — Progress Notes (Signed)
 NEUROLOGY CONSULT FOLLOW UP NOTE   Date of service: March 22, 2024 Patient Name: Alan [name] MRN:  [id number] DOB:  06/17/1956  Interval Hx/subjective  Seen and examined. Remains medically sick with fevers and pneumonia  Vitals   Vitals:   03/22/24 0700 03/22/24 0800 03/22/24 0840 03/22/24 0900  BP: (!) 142/68 131/61  (!) 140/64  Pulse: (!) 104 (!) 103 (!) 106 (!) 107  Resp: (!) 26 (!) 25 (!) 28 (!) 30  Temp:      TempSrc:      SpO2: 98% 96% 96% 95%  Weight:      Height:         Body mass index is 23 kg/m.  Physical Exam   General: Well-developed well-nourished man, no sedation, intubated HEENT: Normocephalic atraumatic Lungs: Clear/vented Cardiovascular: Regular rate rhythm Neurological exam Intubated No sedation Difficult to arouse When I called his name, did not mouth what.  Stuck his tongue out to command.  Did not move his limbs Grimacing to noxious stimulation in all fours Pupils equal round react light, no gaze preference or deviation  Medications  Current Facility-Administered Medications:    0.9 %  sodium chloride  infusion (Manually program via Guardrails IV Fluids), , Intravenous, Once, Kerrin Elspeth BROCKS, MD   0.9 %  sodium chloride  infusion (Manually program via Guardrails IV Fluids), , Intravenous, Once, Jenna Maude BRAVO, NP   0.9 %  sodium chloride  infusion, , Intravenous, Continuous, Gretta Leita SQUIBB, DO, Last Rate: 10 mL/hr at 03/22/24 0900, Infusion Verify at 03/22/24 0900   acetaminophen  (TYLENOL ) tablet 1,000 mg, 1,000 mg, Oral, Q6H, 1,000 mg at 03/18/24 1208 **OR** acetaminophen  (TYLENOL ) 160 MG/5ML solution 1,000 mg, 1,000 mg, Per Tube, Q6H, Gold, Wayne E, PA-C, 1,000 mg at 03/22/24 0506   albumin  human 5 % solution 12.5 g, 250 mL, Intravenous, Q15 min PRN, Gold, Wayne E, PA-C, Stopped at 03/18/24 0335   amiodarone  (PACERONE ) tablet 200 mg, 200 mg, Oral, Daily, Bensimhon, Onofre R, MD, 200 mg at 03/21/24 1000   aspirin  EC tablet 325 mg, 325  mg, Oral, Daily **OR** aspirin  chewable tablet 324 mg, 324 mg, Per Tube, Daily, Gold, Wayne E, PA-C, 324 mg at 03/21/24 1000   atorvastatin  (LIPITOR) tablet 80 mg, 80 mg, Per Tube, Daily, Salam, Savannah B, RPH, 80 mg at 03/21/24 1000   bisacodyl  (DULCOLAX) EC tablet 10 mg, 10 mg, Oral, Daily, 10 mg at 03/19/24 0918 **OR** bisacodyl  (DULCOLAX) suppository 10 mg, 10 mg, Rectal, Daily, Gold, Wayne E, PA-C, 10 mg at 03/21/24 1035   Chlorhexidine  Gluconate Cloth 2 % PADS 6 each, 6 each, Topical, Daily, Kerrin Elspeth BROCKS, MD, 6 each at 03/21/24 1000   docusate (COLACE) 50 MG/5ML liquid 100 mg, 100 mg, Per Tube, BID, Gretta Leita P, DO, 100 mg at 03/21/24 1032   famotidine  (PEPCID ) IVPB 20 mg premix, 20 mg, Intravenous, Q12H, Gretta Leita SQUIBB, DO, Stopped at 03/21/24 2158   feeding supplement (PROSource TF20) liquid 60 mL, 60 mL, Per Tube, BID, Gretta Leita SQUIBB, DO, 60 mL at 03/21/24 2128   feeding supplement (VITAL 1.5 CAL) liquid 1,000 mL, 1,000 mL, Per Tube, Continuous, Gretta Leita SQUIBB, DO, Last Rate: 55 mL/hr at 03/22/24 0900, Infusion Verify at 03/22/24 0900   fentaNYL  (SUBLIMAZE ) injection 50 mcg, 50 mcg, Intravenous, Q15 min PRN, Gretta Leita SQUIBB, DO, 50 mcg at 03/19/24 1625   fentaNYL  (SUBLIMAZE ) injection 50-200 mcg, 50-200 mcg, Intravenous, Q30 min PRN, Gretta Leita P, DO   hydrALAZINE  (APRESOLINE ) injection 10-20  mg, 10-20 mg, Intravenous, Q2H PRN, Shyrl Linnie KIDD, MD, 20 mg at 03/22/24 0154   insulin  aspart (novoLOG ) injection 0-24 Units, 0-24 Units, Subcutaneous, Q4H, Gretta Leita SQUIBB, DO, 2 Units at 03/22/24 9146   insulin  aspart (novoLOG ) injection 3 Units, 3 Units, Subcutaneous, Q4H, Gretta Leita SQUIBB, DO, 3 Units at 03/22/24 9147   insulin  glargine-yfgn (SEMGLEE ) injection 24 Units, 24 Units, Subcutaneous, Daily, Gretta Leita SQUIBB, DO, 24 Units at 03/21/24 1001   ipratropium-albuterol  (DUONEB) 0.5-2.5 (3) MG/3ML nebulizer solution 3 mL, 3 mL, Nebulization, Q4H PRN, Gretta Leita P, DO    levETIRAcetam  (KEPPRA ) undiluted injection 1,000 mg, 1,000 mg, Intravenous, Q12H, Noha Milberger, MD, 1,000 mg at 03/22/24 9145   levofloxacin  (LEVAQUIN ) IVPB 750 mg, 750 mg, Intravenous, Q24H, Gretta Leita SQUIBB, DO, Stopped at 03/21/24 1211   midazolam  (VERSED ) injection 2 mg, 2 mg, Intravenous, Q1H PRN, Gold, Wayne E, PA-C, 2 mg at 03/20/24 1047   milrinone  (PRIMACOR ) 20 MG/100 ML (0.2 mg/mL) infusion, 0.125 mcg/kg/min, Intravenous, Continuous, Colletta Manuelita Garre, PA-C, Last Rate: 3.27 mL/hr at 03/22/24 0900, 0.125 mcg/kg/min at 03/22/24 0900   norepinephrine  (LEVOPHED ) 16 mg in (0.064 mg/mL) premix infusion, 0-40 mcg/min, Intravenous, Titrated, Kerrin Elspeth BROCKS, MD, Stopped at 03/19/24 1013   ondansetron  (ZOFRAN ) injection 4 mg, 4 mg, Intravenous, Q6H PRN, Viviane Lemond BRAVO, PA-C   Oral care mouth rinse, 15 mL, Mouth Rinse, Q2H, Kerrin Elspeth BROCKS, MD, 15 mL at 03/22/24 0800   oxyCODONE  (Oxy IR/ROXICODONE ) immediate release tablet 5-10 mg, 5-10 mg, Per Tube, Q3H PRN, Emmette Fraction B, RPH, 5 mg at 03/19/24 1610   polyethylene glycol (MIRALAX  / GLYCOLAX ) packet 17 g, 17 g, Per Tube, Daily, Gretta Leita P, DO, 17 g at 03/21/24 1001   potassium chloride  (KLOR-CON ) packet 20 mEq, 20 mEq, Per Tube, Q4H, Kerrin Elspeth BROCKS, MD, 20 mEq at 03/22/24 0531   propofol  (DIPRIVAN ) 1000 MG/100ML infusion, 0-80 mcg/kg/min, Intravenous, Continuous, Gretta Leita SQUIBB, DO, Stopped at 03/19/24 1039   sodium chloride  flush (NS) 0.9 % injection 10-40 mL, 10-40 mL, Intracatheter, Q12H, Kerrin Elspeth BROCKS, MD, 10 mL at 03/21/24 2128   sodium chloride  flush (NS) 0.9 % injection 3 mL, 3 mL, Intravenous, Q12H, Gold, Wayne E, PA-C, 3 mL at 03/21/24 2128   sodium chloride  flush (NS) 0.9 % injection 3 mL, 3 mL, Intravenous, PRN, Gold, Wayne E, PA-C, 3 mL at 03/21/24 0818   vancomycin  (VANCOCIN ) 1,000 mg in sodium chloride  0.9 % 250 mL IVPB, 1,000 mg, Intravenous, Q12H, Kerrin Elspeth BROCKS, MD, Stopped at 03/22/24  0040  Labs and Diagnostic Imaging   CBC:  Recent Labs  Lab 03/21/24 0402 03/22/24 0420  WBC 6.9 9.7  HGB 9.3* 9.5*  HCT 28.5* 29.1*  MCV 93.1 95.1  PLT 41* 50*    Basic Metabolic Panel:  Lab Results  Component Value Date   NA 145 03/22/2024   K 3.7 03/22/2024   CO2 25 03/22/2024   GLUCOSE 123 (H) 03/22/2024   BUN 49 (H) 03/22/2024   CREATININE 0.82 03/22/2024   CALCIUM  7.8 (L) 03/22/2024   GFRNONAA >60 03/22/2024   GFRAA >60 05/12/2020   Lipid Panel:  Lab Results  Component Value Date   LDLCALC 58 03/12/2024   HgbA1c:  Lab Results  Component Value Date   HGBA1C 5.0 03/12/2024   INR  Lab Results  Component Value Date   INR 1.5 (H) 03/19/2024   APTT  Lab Results  Component Value Date   APTT 35 03/19/2024  CT Head without contrast(Personally reviewed): No acute abnormality, atrophy and chronic small vessel ischemic disease  CT angio Head and Neck with contrast(Personally reviewed): Dissection of the aortic arch and descending thoracic aorta with edema surrounding the proximal left subclavian artery, mild irregular stenosis of M1 segments of bilateral MCAs, mild left pneumothorax and bilateral pleural effusions  MRI Brain(Personally reviewed): Multiple small infarcts involving different vascular territories suggesting a central embolic source.  Multiple small foci of chronic microhemorrhage-more numerous than July 06, 2023.  No evidence of anoxic brain damage  Continuous EEG 03/19/2024 to 03/20/2024-diffuse encephalopathy.  Event button pressed on 03/19/2024 multiple times of bilateral lower extremity twitching as well as shoulder tracing without definitive EEG changes.  Those events were not epileptic.  Subcortical myoclonus can have similar appearance.   Assessment   QURON RUDDY is a 68 y.o. male with history of CAD, hypertension, hyperlipidemia, mitral valve prolapse status postrepair, SVT status post ablation, metastatic small cell lung cancer in  remission who was originally admitted after a non-STEMI and underwent CABG which was complicated by intraoperative aortic dissection.  After sedation was held, he was noted to have a lot of twitching with LPD's on the EEG along with myoclonic status epilepticus-2 episodes.  Hypoxic/anoxic brain injury is suspected but with weaning sedation, status started to improve when he is following midline commands.  MRI revealed very small volume scattered embolic infarcts keeping in line with the intraoperative dissection complication. He is somewhat worse in terms of mentation over the past 2 days-he spiked a fever and a suspected pneumonia which is being treated. Overall picture has some contribution from strokes but is primarily encephalopathy in the setting of acute infection and acute illness.  Impression Acute ischemic infarcts in the setting of aortic dissection as a complication of CABG Toxic metabolic encephalopathy in the setting of acute pneumonia  Recommendations  Continue supportive care Continue frequent neurochecks Antiplatelet per CTS Suspect improvement in mentation with resolution of pneumonia Had a detailed discussion with PCCM and CT surgery attendings on the unit.  -- Eligio Lav, MD Neurologist [facility] Pager: [phone]

## 2024-03-22 NOTE — Progress Notes (Signed)
 Advanced Heart Failure Rounding Note  Cardiologist: [name], MD   Chief Complaint: Post-op CABG and Bentall procedure   Subjective:    POD #5  Restarted on milrinone  0.125 8/7. Otherwise off pressors. Good urine output yesterday, - , otherwise no pressor requirements. Febrile all yesterday, suspect PNA source given ETT secretions, culture pending but large growth already.    Objective:   Weight Range: 85.7 kg Body mass index is 23 kg/m.   Vital Signs:   Temp:  [99.9 F (37.7 C)-102 F (38.9 C)] 101.3 F (38.5 C) (08/09 1100) Pulse Rate:  [89-110] 109 (08/09 1100) Resp:  [23-30] 28 (08/09 1100) BP: (108-147)/(51-68) 138/61 (08/09 1100) SpO2:  [93 %-99 %] 94 % (08/09 1100) Arterial Line BP: (113-175)/(40-101) 161/48 (08/09 1100) FiO2 (%):  [40 %-50 %] 40 % (08/09 1100) Weight:  [85.7 kg] 85.7 kg (08/09 0500) Last BM Date : 03/21/24  Weight change: Filed Weights   03/20/24 0500 03/21/24 0400 03/22/24 0500  Weight: 87.2 kg 77.9 kg 85.7 kg    Intake/Output:   Intake/Output Summary (Last 24 hours) at 03/22/2024 1230 Last data filed at 03/22/2024 1100 Gross per 24 hour  Intake 2447.81 ml  Output 2845 ml  Net -397.19 ml      Physical Exam    CVP 10 General:  critically ill appearing, intubated.  Neck: RIJ CVC  Cor: RRR, + sternal dressing Lungs: intubated, diffuse rhonchi, increased rate and work of breathing on the vent Abdomen: soft, NT, ND  Extremities: trace BLE edema Neuro: lethargic   Telemetry   Sinus tach, low 100s, personally reviewed   Labs    CBC Recent Labs    03/21/24 0402 03/22/24 0420  WBC 6.9 9.7  HGB 9.3* 9.5*  HCT 28.5* 29.1*  MCV 93.1 95.1  PLT 41* 50*   Basic Metabolic Panel Recent Labs    91/92/74 0413 03/21/24 0402 03/22/24 0420  NA 139 142 145  K 3.9 3.6 3.7  CL 108 110 111  CO2 26 25 25   GLUCOSE 161* 145* 123*  BUN 49* 52* 49*  CREATININE 1.11 0.90 0.82  CALCIUM  7.8* 7.8* 7.8*  MG 2.6* 2.6* 2.6*   PHOS 2.9 2.6  --      Medications:     Scheduled Medications:  sodium chloride    Intravenous Once   sodium chloride    Intravenous Once   acetaminophen   1,000 mg Oral Q6H   Or   acetaminophen  (TYLENOL ) oral liquid 160 mg/5 mL  1,000 mg Per Tube Q6H   amiodarone   200 mg Oral Daily   aspirin  EC  325 mg Oral Daily   Or   aspirin   324 mg Per Tube Daily   atorvastatin   80 mg Per Tube Daily   bisacodyl   10 mg Oral Daily   Or   bisacodyl   10 mg Rectal Daily   Chlorhexidine  Gluconate Cloth  6 each Topical Daily   docusate  100 mg Per Tube BID   feeding supplement (PROSource TF20)  60 mL Per Tube BID   furosemide   60 mg Intravenous Q8H   insulin  aspart  0-24 Units Subcutaneous Q4H   [START ON 03/23/2024] insulin  glargine-yfgn  15 Units Subcutaneous Daily   levETIRAcetam   1,000 mg Intravenous Q12H   mouth rinse  15 mL Mouth Rinse Q2H   polyethylene glycol  17 g Per Tube Daily   potassium chloride   20 mEq Per Tube Q4H   sodium chloride  flush  10-40 mL Intracatheter Q12H  sodium chloride  flush  3 mL Intravenous Q12H   sodium chloride  HYPERTONIC  4 mL Nebulization Q4H    Infusions:  sodium chloride  Stopped (03/22/24 0946)   albumin  human Stopped (03/18/24 0335)   famotidine  (PEPCID ) IV 20 mg (03/22/24 1210)   feeding supplement (VITAL 1.5 CAL) 55 mL/hr at 03/22/24 1100   levofloxacin  (LEVAQUIN ) IV 100 mL/hr at 03/22/24 1100   milrinone  0.125 mcg/kg/min (03/22/24 1100)   norepinephrine  (LEVOPHED ) Adult infusion Stopped (03/19/24 1013)   potassium chloride      propofol  (DIPRIVAN ) infusion Stopped (03/19/24 1039)   vancomycin  (VANCOCIN ) 1,000 mg in sodium chloride  0.9 % 250 mL IVPB 250 mL/hr at 03/22/24 1100    PRN Medications: albumin  human, fentaNYL  (SUBLIMAZE ) injection, fentaNYL  (SUBLIMAZE ) injection, hydrALAZINE , ipratropium-albuterol , midazolam , ondansetron  (ZOFRAN ) IV, oxyCODONE , sodium chloride  flush    Patient Profile  68 y.o. male with history of CAD, severe MR s/p  minimally invasive mitral valve repair, stage III NSCLC.   Patient admitted with NSTEMI and new systolic CHF. Underwent 3v CABG X 3 c/b ascending aortic dissection.  Assessment/Plan   1. NSTEMI with known CAD -Remote anterior STEMI with prior stents to LAD -NSTEMI - 3 V CAD on cath with 99% in-stent restenosis in LAD, 50% residual stenosis post PTCA -CABG X 3 (LIMA to LAD, SVG to OM, SBVG to PDA) 03/17/24 as above -Aspirin  + statin.   2. Ascending Aortic Dissection  - complication of CABG - s/p Bentall and resuspenison of AoV    3. Acute systolic CHF/ICM -Newly reduced EF -Echo 03/10/24: EF 30-35%, anterior WMA, RV mildly reduced -Post CABG limited echo 8/7: EF improving, 40-45% w/ septal dyssynchrony  -On Milrinone  0.125. Co-ox 78%. CVP 10 - Stop milrinone  today, especially with septic physiology and concern for infection -Continue IV Lasix  80 mg x 2 w/ K supp   4. Pulm/ Suspected PNA  -Remains intubated. Awake and following commands but too weak for extubation -Febrile w/ thick ETT secretions. Check tracheal aspirate, culture pending -On vancomycin  and levaquin  (PCN allergy) - Check Qtc with levaquin  -vent management per CCM  5. Severe MR s/p minimally invasive mitral valve repair -trivial MR with mean gradient of 4 mmhg on echo this admit   6. Postop anemia/thrombocytopenia -Multiple blood products in OR and immediately post-op -Hgb improved and remains stable at 9.5  -Platelets mildly improved, 50K -Follow closely -Has not been on heparin  post-op. Holding DVT prophylaxis.  7. NSVT -No recurrence -Continue amiodarone  gtt for now.   8. NSCLC w/bone mets -S/p chemoradiation and immunotherapy -Stable disease on most recent outpatinet imaging. Followed by Heme/Onc -Possible new sclerotic lesion/small LUL nodule on CXR this admission. Will eventually require CT.  9. Myoclonic seizures - Suspect hypoxic/anoxic brain injury - Neurology following - Off sedation and  following commands.  - Keppra  - MRI brain with multiple small infarcts  10. Left pneumothorax - stable on CXR today  - per CT surgery keep left chest tube   CRITICAL CARE Performed by: Morene JINNY Brownie   Total critical care time: 40 minutes  Critical care time was exclusive of separately billable procedures and treating other patients.  Critical care was necessary to treat or prevent imminent or life-threatening deterioration.  Critical care was time spent personally by me on the following activities: development of treatment plan with patient and/or surrogate as well as nursing, discussions with consultants, evaluation of patient's response to treatment, examination of patient, obtaining history from patient or surrogate, ordering and performing treatments and interventions, ordering and  review of laboratory studies, ordering and review of radiographic studies, pulse oximetry and re-evaluation of patient's condition.

## 2024-03-22 NOTE — Progress Notes (Signed)
 NAME:  Alan [name], MRN:  [id number], DOB:  1956-01-14, LOS: 12 ADMISSION DATE:  03/10/2024, CONSULTATION DATE:  8/4 REFERRING MD:  hendrickson, CHIEF COMPLAINT:  post-op cardiac surgery    History of Present Illness:  68 year old male w/ h/o HTN, CAD, prior DES x 3 to LAD, minimally invasive MVR (2015), SVT s/p ablation,  metastatic NSCLC w/ mets to bone (in remission).  Admitted 7/28 w/ chest pain. Initially started 7/26 went to ER and CEs and EKG neg so went home but CP persisted so returned.  In ER on 28th + trop I, new T wave inversion in anterior lateral leads.  Treated w/ supplemental oxygen, morphine , started on heparin  gtt.  Went to cath lab  Findings:  99% -80% in-stent restenosis of LAD balloon angioplasty completed w/ partial restoration of flow (this re-stenosed stent was felt culprit lesion). RCA 45 to 70% stenosed, cx was 40%. Had nml LVEDP, no AS, referred to CVTS for CABG given 3V disease. and placed on plavix . EF from ECHO 30-35% w/ left regional wall motion abnormality.   Went to OR 8/4 for planned CABG x 3, as well as repair of ascending aortic dissection w/ vascular graft and repair of aortic valve  Time on bypass: ~ 4h78min EBL 1390 Cell saver 910 Received: FFP X2, PLTs 1 unit PRBC and cryo for on going surgical oozing Arrived in ICU w 480 ml bloody outpt from Chest tube Surgical team at bedside on arrival  Shortly after arrival to ICU had runs of VT and AF w/ RVR. Amiodarone  bolus and gtt initiated.  Got 2 gms calcium  another 2 units PLTs. TXA  Pertinent  Medical History  Prior CAD w/ DES X 2 and prior minimally invasive MVR 2015, NSCLC q/ mets to bone (2023) s/p chemo and XRT w/ most recent scans showing stable non-active disease  NSTEMI, HLD Significant Hospital Events: Including procedures, antibiotic start and stop dates in addition to other pertinent events   7/28 admitted w/ CP and acute inf/lat MI, cardiac cath svr 3V disease w/ re-stenosed LAD stent and  incomplete restoration of coronary flow. EF 30-35% referred for CABG 8/4 to OR. CABG x 3 v but as completing CABG noted blood loss and intra-op ECHO showed dissection of Aorta. So underwent AV graft placement and AVR. Large EBL in OR received several blood products. Immediate post op w/ large vol CT output, runs of VT and SVT. Got 2gms calcium , amiodarone  and placed on overdrive pacing via pacer critical care at bedside assisting w/ support  8/5 myoclonus; normal head CT& CTA, EEG started. Loaded with keppra .  8/6 remained on EEG, weaned off propofol   Interim History / Subjective:  Overnight remained off sedation. He denies discomfort today. Failed SBT this morning due to WOB  Objective    Blood pressure 138/61, pulse (!) 109, temperature (!) 101.3 F (38.5 C), temperature source Bladder, resp. rate (!) 28, height 6' 4 (1.93 m), weight 85.7 kg, SpO2 94%. CVP:  [4 mmHg-13 mmHg] 6 mmHg  Vent Mode: PSV;CPAP FiO2 (%):  [40 %-50 %] 40 % Set Rate:  [16 bmp] 16 bmp Vt Set:  [620 mL] 620 mL PEEP:  [5 cmH20] 5 cmH20 Pressure Support:  [8 cmH20] 8 cmH20 Plateau Pressure:  [18 cmH20] 18 cmH20   Intake/Output Summary (Last 24 hours) at 03/22/2024 1136 Last data filed at 03/22/2024 1100 Gross per 24 hour  Intake 2611.25 ml  Output 3155 ml  Net -543.75 ml   [employer]  03/20/24 0500 03/21/24 0400 03/22/24 0500  Weight: 87.2 kg 77.9 kg 85.7 kg    Examination: General: Chronically ill appearing man lying in bed in NAD. Intubated, not sedated.  HENT: [place]/AT, eyes anicteric, ETT in place.  Lungs:  tachypneic on MV, rhales on the left. Air leak from chest tube. Mild white secretions from ETT.  Paradoxical breathing on SBT. Cardiovascular: S1S2, tachycardic, reg rhythm Abdomen: soft, NT Extremities: improving edema, R radial aline with good distal perfusion.  LUE picc.  Neuro: RASS -1, opens eyes and tracks to stimulation. Very weak with following commands, strength very diminished. GU: clear  yellow urine  BUN 49 Cr 0.82 WBC 9.7 H/H  9.5/29.1 Platelets 50 Coox 78%  CXR personally reviewed>left lung full inflated, chest tube remains in place. ETT about 5 cm above carina. RLL infiltrate.  Respiratory culture: PMNs, abundant GNR & GPC>   Resolved problem list   Assessment and Plan   3V CAD and restenosis of DES now s/p CABG x 3 V w/ intraoperative finding of ascending aorta dissection requiring aortic graft and AVR Acute on chronic HFrEF due to ICM with cardiogenic shock. H/o HLD, HLD and CAD w/ severe mitral valve disease. Prior stents and mini- mitral valve repair (2015) for severe MR. 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 Last PET w/ no active disease- in remission since 2023. Possible new sclerotic lesion on  CXR with small LUL nodule. -eventually needs CT for further evaluation   Hyperglycemia; A1c 5.0 -decrease semglee  to 15 units daily -d/c TF coverage  -SSI PRN -goal BG 140-180  At risk for malnutrition -TF via cortrak  Wife updated at bedside this morning.   Best Practice (right click and Reselect all SmartList Selections daily)   Diet/type: tubefeeds DVT prophylaxis SCD Pressure ulcer(s): N/A GI prophylaxis: PPI Lines: Central line, Arterial Line, and yes and it is still needed Foley:  Yes, and it is still needed Code Status:  full code Last date of multidisciplinary goals of care discussion [per primary ]  Labs   CBC: Recent Labs  Lab 03/19/24 0222 03/19/24 1000 03/20/24 0413 03/21/24 0402 03/22/24 0420  WBC 12.9* 9.1 8.4 6.9 9.7  HGB 7.6* 9.8* 9.9* 9.3* 9.5*  HCT 21.9* 28.4* 30.5* 28.5* 29.1*  MCV 90.1 89.3 92.1 93.1 95.1  PLT 101* 67* 48* 41* 50*    Basic Metabolic Panel: Recent Labs  Lab 03/18/24 1130 03/18/24 1641 03/19/24 0226 03/19/24 1000 03/20/24 0413 03/21/24 0402 03/22/24 0420  NA 141  141 139 139 139 142 145  K 4.5 4.2 4.0 4.2 3.9 3.6 3.7  CL 109 108 107 108 108 110 111  CO2 22 23 22 22 26 25 25   GLUCOSE 133* 119* 161* 171* 161* 145* 123*  BUN 18 24* 34* 40* 49* 52* 49*  CREATININE 1.06 1.19 1.17 1.18 1.11 0.90 0.82  CALCIUM  8.0* 7.7* 7.8* 7.8* 7.8* 7.8* 7.8*  MG 2.6* 2.5* 2.6*  --  2.6* 2.6* 2.6*  PHOS 4.7*  --  4.4  --  2.9 2.6  --     ABG    Component Value Date/Time   PHART 7.409 03/17/2024 2158   PCO2ART 32.7 03/17/2024 2158   PO2ART 80 (L) 03/17/2024 2158   HCO3 20.7 03/17/2024 2158   TCO2 22 03/17/2024 2158   ACIDBASEDEF 3.0 (H) 03/17/2024 2158   O2SAT 78.4 03/22/2024 0512     Coagulation Profile: Recent Labs  Lab 03/16/24 0410 03/17/24 1446 03/17/24 1615 03/19/24 0222  INR 1.0 2.3* 1.8* 1.5*     Critical care time:    This patient is critically ill with multiple organ system failure which requires frequent  high complexity decision making, assessment, support, evaluation, and titration of therapies. This was completed through the application of advanced monitoring technologies and extensive interpretation of multiple databases. During this encounter critical care time was devoted to patient care services described in this note for 45 minutes.   Leita SHAUNNA Gaskins, DO 03/22/24 12:03 PM [facility]  For contact information, see Amion. If no response to pager, please call PCCM consult pager. After hours, 7PM- 7AM, please call Elink.

## 2024-03-22 NOTE — Progress Notes (Signed)
 [address]       [place] [address]             [phone]                 5 Days Post-Op Procedure(s) (LRB): CORONARY ARTERY BYPASS GRAFTING TIMES THREE , USING LEFT INTERNAL MAMMARY ARTERY AND ENDOSCOPIC HARVESTED RIGHT SAPHENOUS VEIN (N/A) ECHOCARDIOGRAM, TRANSESOPHAGEAL, INTRAOPERATIVE (N/A) REPAIR, AORTIC DISSECTION, ASCENDING USING 30 MM HEMASHIELD PLATINUM VASCULAR GRAFT RESUSPENSION OF AORTIC VALVE   Events: No events _______________________________________________________________ Vitals: BP 131/61 (BP Location: Right Arm)   Pulse (!) 106   Temp (!) 100.8 F (38.2 C)   Resp (!) 28   Ht 6' 4 (1.93 m)   Wt 85.7 kg   SpO2 96%   BMI 23.00 kg/m  Filed Weights   03/20/24 0500 03/21/24 0400 03/22/24 0500  Weight: 87.2 kg 77.9 kg 85.7 kg     - Neuro: arouable to voice  - Cardiovascular: sinus  Drips: milr 0.125.   CVP:  [4 mmHg-13 mmHg] 10 mmHg  - Pulm: + air leak Vent Mode: PSV;CPAP FiO2 (%):  [40 %-50 %] 40 % Set Rate:  [16 bmp] 16 bmp Vt Set:  [620 mL] 620 mL PEEP:  [5 cmH20] 5 cmH20 Pressure Support:  [8 cmH20] 8 cmH20 Plateau Pressure:  [18 cmH20] 18 cmH20  ABG    Component Value Date/Time   PHART 7.409 03/17/2024 2158   PCO2ART 32.7 03/17/2024 2158   PO2ART 80 (L) 03/17/2024 2158   HCO3 20.7 03/17/2024 2158   TCO2 22 03/17/2024 2158   ACIDBASEDEF 3.0 (H) 03/17/2024 2158   O2SAT 78.4 03/22/2024 0512    - Abd: ND - Extremity: warm  .Intake/Output      08/08 0701 08/09 0700 08/09 0701 08/10 0700   I.V. (mL/kg) 246.3 (2.9) 13.3 (0.2)   NG/GT 1320 55   IV Piggyback 869.8    Total Intake(mL/kg) 2436.2 (28.4) 68.3 (0.8)   Urine (mL/kg/hr) 2590 (1.3) 175 (1.2)   Stool 310    Chest Tube 390    Total Output 3290 175   Net -853.8 -106.7        Stool Occurrence 2 x       _______________________________________________________________ Labs:    Latest Ref Rng & Units 03/22/2024    4:20 AM 03/21/2024    4:02 AM  03/20/2024    4:13 AM  CBC  WBC 4.0 - 10.5 K/uL 9.7  6.9  8.4   Hemoglobin 13.0 - 17.0 g/dL 9.5  9.3  9.9   Hematocrit 39.0 - 52.0 % 29.1  28.5  30.5   Platelets 150 - 400 K/uL 50  41  48       Latest Ref Rng & Units 03/22/2024    4:20 AM 03/21/2024    4:02 AM 03/20/2024    4:13 AM  CMP  Glucose 70 - 99 mg/dL 876  854  838   BUN 8 - 23 mg/dL 49  52  49   Creatinine 0.61 - 1.24 mg/dL 9.17  9.09  8.88   Sodium 135 - 145 mmol/L 145  142  139   Potassium 3.5 - 5.1 mmol/L 3.7  3.6  3.9   Chloride 98 - 111 mmol/L 111  110  108   CO2 22 - 32 mmol/L 25  25  26    Calcium  8.9 - 10.3 mg/dL 7.8  7.8  7.8     CXR: stable  _______________________________________________________________  Assessment and Plan: POD 5 s/p CABG, dissection repair.  Neuro: nonfocal, but weak CV: wean milr as tolerated.  Treating HTN.   Pulm: weaning vent as tolerated.  Will keep chest tube until extubated given air leak Renal: creat stable GI: tube feeds Heme: stable ID: [email or web address] Endo: SSI Dispo: continue ICU care   Alan [name] 03/22/2024 8:47 AM

## 2024-03-22 NOTE — Discharge Instructions (Signed)

## 2024-03-23 ENCOUNTER — Inpatient Hospital Stay (HOSPITAL_COMMUNITY)

## 2024-03-23 DIAGNOSIS — I7100 Dissection of unspecified site of aorta: Secondary | ICD-10-CM | POA: Diagnosis not present

## 2024-03-23 DIAGNOSIS — T82898A Other specified complication of vascular prosthetic devices, implants and grafts, initial encounter: Secondary | ICD-10-CM | POA: Diagnosis not present

## 2024-03-23 DIAGNOSIS — I6340 Cerebral infarction due to embolism of unspecified cerebral artery: Secondary | ICD-10-CM | POA: Diagnosis not present

## 2024-03-23 DIAGNOSIS — I639 Cerebral infarction, unspecified: Secondary | ICD-10-CM | POA: Diagnosis not present

## 2024-03-23 DIAGNOSIS — R570 Cardiogenic shock: Secondary | ICD-10-CM | POA: Diagnosis not present

## 2024-03-23 DIAGNOSIS — J9601 Acute respiratory failure with hypoxia: Secondary | ICD-10-CM | POA: Diagnosis not present

## 2024-03-23 DIAGNOSIS — Z951 Presence of aortocoronary bypass graft: Secondary | ICD-10-CM | POA: Diagnosis not present

## 2024-03-23 DIAGNOSIS — J189 Pneumonia, unspecified organism: Secondary | ICD-10-CM | POA: Diagnosis not present

## 2024-03-23 DIAGNOSIS — I214 Non-ST elevation (NSTEMI) myocardial infarction: Secondary | ICD-10-CM | POA: Diagnosis not present

## 2024-03-23 LAB — BASIC METABOLIC PANEL WITH GFR
Anion gap: 11 (ref 5–15)
BUN: 45 mg/dL — ABNORMAL HIGH (ref 8–23)
CO2: 27 mmol/L (ref 22–32)
Calcium: 7.5 mg/dL — ABNORMAL LOW (ref 8.9–10.3)
Chloride: 114 mmol/L — ABNORMAL HIGH (ref 98–111)
Creatinine, Ser: 0.86 mg/dL (ref 0.61–1.24)
GFR, Estimated: >60 mL/min (ref >60)
Glucose, Bld: 136 mg/dL — ABNORMAL HIGH (ref 70–99)
Potassium: 3.6 mmol/L (ref 3.5–5.1)
Sodium: 152 mmol/L — ABNORMAL HIGH (ref 135–145)

## 2024-03-23 LAB — GLUCOSE, CAPILLARY
Glucose-Capillary: 111 mg/dL — ABNORMAL HIGH (ref 70–99)
Glucose-Capillary: 111 mg/dL — ABNORMAL HIGH (ref 70–99)
Glucose-Capillary: 123 mg/dL — ABNORMAL HIGH (ref 70–99)
Glucose-Capillary: 126 mg/dL — ABNORMAL HIGH (ref 70–99)
Glucose-Capillary: 126 mg/dL — ABNORMAL HIGH (ref 70–99)
Glucose-Capillary: 95 mg/dL (ref 70–99)

## 2024-03-23 LAB — CULTURE, RESPIRATORY W GRAM STAIN

## 2024-03-23 LAB — CBC
HCT: 29.6 % — ABNORMAL LOW (ref 39.0–52.0)
Hemoglobin: 9.3 g/dL — ABNORMAL LOW (ref 13.0–17.0)
MCH: 30.7 pg (ref 26.0–34.0)
MCHC: 31.4 g/dL (ref 30.0–36.0)
MCV: 97.7 fL (ref 80.0–100.0)
Platelets: 55 K/uL — ABNORMAL LOW (ref 150–400)
RBC: 3.03 MIL/uL — ABNORMAL LOW (ref 4.22–5.81)
RDW: 17.2 % — ABNORMAL HIGH (ref 11.5–15.5)
WBC: 9.6 K/uL (ref 4.0–10.5)
nRBC: 2.1 % — ABNORMAL HIGH (ref 0.0–0.2)

## 2024-03-23 LAB — VANCOMYCIN, TROUGH: Vancomycin Tr: 8 ug/mL — ABNORMAL LOW (ref 15–20)

## 2024-03-23 LAB — VANCOMYCIN, PEAK: Vancomycin Pk: 23 ug/mL — ABNORMAL LOW (ref 30–40)

## 2024-03-23 LAB — COOXEMETRY PANEL
Carboxyhemoglobin: 2.1 % — ABNORMAL HIGH (ref 0.5–1.5)
Methemoglobin: <0.7 % (ref 0.0–1.5)
O2 Saturation: 52.3 %
Total hemoglobin: 9.6 g/dL — ABNORMAL LOW (ref 12.0–16.0)

## 2024-03-23 LAB — AMMONIA: Ammonia: 36 umol/L — ABNORMAL HIGH (ref 9–35)

## 2024-03-23 MED ORDER — FUROSEMIDE 10 MG/ML IJ SOLN
80.0000 mg | Freq: Three times a day (TID) | INTRAMUSCULAR | Status: AC
  Administered 2024-03-23 (×2): 80 mg via INTRAVENOUS
  Filled 2024-03-23 (×2): qty 8

## 2024-03-23 MED ORDER — POTASSIUM CHLORIDE 20 MEQ PO PACK
20.0000 meq | PACK | ORAL | Status: AC
  Administered 2024-03-23 (×3): 20 meq
  Filled 2024-03-23 (×3): qty 1

## 2024-03-23 MED ORDER — ACETAMINOPHEN 325 MG PO TABS
650.0000 mg | ORAL_TABLET | Freq: Four times a day (QID) | ORAL | Status: DC
  Administered 2024-03-23 – 2024-03-24 (×5): 650 mg via ORAL
  Filled 2024-03-23 (×4): qty 2

## 2024-03-23 MED ORDER — FREE WATER
200.0000 mL | Status: DC
  Administered 2024-03-23 – 2024-03-25 (×16): 200 mL

## 2024-03-23 MED ORDER — VANCOMYCIN HCL 1250 MG/250ML IV SOLN
1250.0000 mg | Freq: Two times a day (BID) | INTRAVENOUS | Status: DC
  Administered 2024-03-24 – 2024-03-25 (×8): 1250 mg via INTRAVENOUS
  Filled 2024-03-23 (×6): qty 250

## 2024-03-23 MED ORDER — LINEZOLID 600 MG/300ML IV SOLN
600.0000 mg | Freq: Two times a day (BID) | INTRAVENOUS | Status: DC
  Filled 2024-03-23: qty 300

## 2024-03-23 MED ORDER — VANCOMYCIN HCL IN DEXTROSE 1-5 GM/200ML-% IV SOLN
1000.0000 mg | Freq: Two times a day (BID) | INTRAVENOUS | Status: DC
  Administered 2024-03-23: 1000 mg via INTRAVENOUS
  Filled 2024-03-23 (×2): qty 200

## 2024-03-23 MED ORDER — MILRINONE LACTATE IN DEXTROSE 20-5 MG/100ML-% IV SOLN
0.1250 ug/kg/min | INTRAVENOUS | Status: DC
  Administered 2024-03-23: 0.125 ug/kg/min via INTRAVENOUS
  Filled 2024-03-23: qty 100

## 2024-03-23 MED ORDER — VANCOMYCIN HCL 1250 MG/250ML IV SOLN
1250.0000 mg | Freq: Once | INTRAVENOUS | Status: AC
  Administered 2024-03-24 (×2): 1250 mg via INTRAVENOUS
  Filled 2024-03-23: qty 250

## 2024-03-23 NOTE — Evaluation (Signed)
 Clinical/Bedside Swallow Evaluation Patient Details  Name: Alan [name] MRN: [id number] Date of Birth: 01-20-1956  Today's Date: 03/23/2024 Time: SLP Start Time (ACUTE ONLY): 1257 SLP Stop Time (ACUTE ONLY): 1308 SLP Time Calculation (min) (ACUTE ONLY): 11 min  Past Medical History:  Past Medical History:  Diagnosis Date   Adenocarcinoma of right lung, stage 3 (HCC) dx'd 03/2019   CAD S/P percutaneous coronary angioplasty 2004   (Ant STEMI) - Prox LAD 100% => 2 overlapping 3.5 x 1.8 mm Cypher DES stents.;  Patent as of August 2015   Dyslipidemia, goal LDL below 70    Essential hypertension    GERD (gastroesophageal reflux disease)    History of Mitral valve prolapse    Moderate - with moderate MR, noted February t 2013   History of radiation therapy    Right Pelvis & left humerus- 12/05/21-12/16/21- Dr. Lynwood Nasuti   History of Severe mitral regurgitation by prior echocardiogram 02/06/2014   TEE: Severe mitral regurgitation with a flail P2 segment and ruptured; Normal LV size & function, dilated LA.   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   Past Surgical History:  Past Surgical History:  Procedure Laterality Date   ANTERIOR CRUCIATE LIGAMENT REPAIR Left 1993   COLONOSCOPY     CORONARY ARTERY BYPASS GRAFT N/A 03/17/2024   Procedure: CORONARY ARTERY BYPASS GRAFTING  TIMES THREE , USING LEFT INTERNAL MAMMARY ARTERY AND ENDOSCOPIC HARVESTED RIGHT SAPHENOUS VEIN;  Surgeon: Kerrin Elspeth BROCKS, MD;  Location: [facility] OR;  Service: Open Heart Surgery;  Laterality: N/A;   CORONARY BALLOON ANGIOPLASTY N/A 03/10/2024   Procedure: CORONARY BALLOON ANGIOPLASTY;  Surgeon: Anner Alm ORN, MD;  Location: [facility] INVASIVE CV LAB;  Service: Cardiovascular;  Laterality: N/A;   INTRAOPERATIVE TRANSESOPHAGEAL ECHOCARDIOGRAM N/A 04/08/2014   Procedure: INTRAOPERATIVE TRANSESOPHAGEAL ECHOCARDIOGRAM;  Surgeon: Sudie VEAR Laine, MD;  Location: [facility] OR;  Service: Open Heart Surgery;  Laterality: N/A;   INTRAOPERATIVE TRANSESOPHAGEAL ECHOCARDIOGRAM N/A 03/17/2024   Procedure: ECHOCARDIOGRAM, TRANSESOPHAGEAL, INTRAOPERATIVE;  Surgeon: Kerrin Elspeth BROCKS, MD;  Location: [facility] OR;  Service: Open Heart Surgery;  Laterality: N/A;   IR RADIOLOGIST EVAL & MGMT  10/04/2022   IR RADIOLOGIST EVAL & MGMT  11/01/2022   IR RADIOLOGIST EVAL & MGMT  12/06/2022   IR RADIOLOGIST EVAL & MGMT  01/23/2023   LEFT AND RIGHT HEART CATHETERIZATION WITH CORONARY ANGIOGRAM N/A 03/05/2014   Procedure: LEFT AND RIGHT HEART CATHETERIZATION WITH CORONARY ANGIOGRAM;  Surgeon: Alm ORN Anner, MD;  Location: [facility] CATH LAB;  Service: Cardiovascular; (pre-op) Widely patent LAD stents, 40% distal LAD, 30% Cx, ~50% PL-PDA bifurcation lesion    LEFT HEART CATH AND CORONARY ANGIOGRAPHY  2004   In setting of anterior STEMI, found to have 100% % proximal LAD (2 DES Cypher stents)   LEFT HEART CATH AND CORONARY ANGIOGRAPHY  2007   Widely patent LAD stents, 40% distal LAD, 30% Cx, ~50%  PL-PDA bifurcation lesion    LEFT HEART CATH AND CORONARY ANGIOGRAPHY N/A 03/10/2024   Procedure: LEFT HEART CATH AND CORONARY ANGIOGRAPHY;  Surgeon: Anner Alm ORN, MD;  Location: [facility] INVASIVE CV LAB;  Service: Cardiovascular;  Laterality: N/A;   LEFT HEART CATHETERIZATION WITH CORONARY ANGIOGRAM N/A 09/15/2011   Procedure: LEFT HEART CATHETERIZATION WITH  CORONARY ANGIOGRAM;  Surgeon: Dorn JINNY Lesches, MD;  Location: [facility] CATH LAB;  Service: Cardiovascular;  Laterality: N/A;   MITRAL VALVE REPAIR Right 04/08/2014   Procedure: MINIMALLY INVASIVE MITRAL VALVE REPAIR (MVR);  Surgeon: Sudie VEAR Laine, MD;  Location: [facility] OR;  Service: Open Heart Surgery;  Laterality: Right;   NM MYOVIEW  LTD  12/2009   Walk 9 minutes, and 10 METs, diaphragmatic attenuation but no ischemia or infarction.   REPAIR OF ACUTE ASCENDING THORACIC AORTIC DISSECTION  03/17/2024   Procedure: REPAIR, AORTIC DISSECTION, ASCENDING USING 30 MM HEMASHIELD PLATINUM VASCULAR GRAFT RESUSPENSION OF AORTIC VALVE;  Surgeon: Kerrin Elspeth BROCKS, MD;  Location: [facility] OR;  Service: Open Heart Surgery;;   SVT ABLATION N/A 10/17/2019   Procedure: SVT ABLATION;  Surgeon: Inocencio Soyla Lunger, MD;  Location: [facility] INVASIVE CV LAB;  Service: Cardiovascular;  Laterality: N/A;   TEE WITHOUT CARDIOVERSION N/A 02/06/2014   Procedure: TRANSESOPHAGEAL ECHOCARDIOGRAM (TEE);  Surgeon: Vinie KYM Maxcy, MD;  Normal LV Size U& function - EF 55-60%, no regional WMA.  MV P2 Leaflet is flail with ruptured chord with severe prolapse, anterior leaflet intact.  Severe, eccentric anterior directed MR with dilated LA.   TRANSTHORACIC ECHOCARDIOGRAM  07/18/2018   Normal LV size and function.  EF 50-60 %.  Unable to assess diastolic function.  Mitral valve sewing ring in place.  Mild stenosis with a gradient of 6 mmHg noted. -   TRANSTHORACIC ECHOCARDIOGRAM  09/30/2021   Stable findings.  EF 50 to 55%.  No RWMA.  Mitral valve stable.  No significant MR.  Normal aortic valve.  Mild aortic dilation, but probably normal for age.   VIDEO BRONCHOSCOPY WITH ENDOBRONCHIAL ULTRASOUND N/A 05/16/2019   Procedure: VIDEO BRONCHOSCOPY WITH ENDOBRONCHIAL ULTRASOUND;  Surgeon: Mannam, Praveen, MD;  Location: [facility] OR;  Service: Pulmonary;  Laterality: N/A;   HPI:  Alan [name] is a 68 year old male with a past medical history of CAD (s/p STEMI  with PCI and DES to LAD 2004) followed by Dr. Anner, HTN, HLD, mitral valve prolapse (s/p mini mitral valve repair utilizing a 34mm Sorin Memo 3D Reochord ring annuloplasty by Dr. Laine 2015), SVT (s/p ablation 2021), and metastatic lung cancer (s/p palliative radiotherapy, now stable). The patient presented to the ED on 07/26 due to intermittent chest pain over the last week, he reports it started last Monday but he thought it was muscle soreness due to him starting to lift weights. The night of 07/25 he had chest pain that was evaluated by EMS but eventually resolved so he was not transported to the ED. On 07/26 he noted centralized sharp substernal chest pain will dull radiation to the rest of his body and numbness of bilateral arms to his elbows. He denies nausea and vomiting, shortness of breath, diaphoresis, and LOC. Nitroglycerin  provided some relief. He presented to the [facility] ED on 07/26 but was discharged home since troponin levels were flat and he had atypical chest discomfort. The chest pain again worsened on 07/28 so he presented to urgent care and was directed to [facility] ED due to EKG with new T wave inversions. Troponin I (  high sensitivity) peaked at 1469, he was ruled in for NSTEMI. Cardiac catheterization 03/10/24 showed 99% LAD in stent restenosis with 60% residual stenosis after balloon angioplasty, mid LAD 80% stenosis with residual 50% stenosis after balloon angioplasty, questionable ostial left circumflex 60-70% stenosis, RCA with ulcerated plaque and focal concentric heavily calcified 70% stenosis and the remainder of the RCA with mild diffuse disease. He was started on Cangrelor  and heparin  post PCI. The patient was given Effient  on 07/28 for cardiac catheterization and later started on Plavix  but it does not look like he was given a dose of Plavix , this has been discontinued. Echocardiogram on 07/28 showed LVEF 30-35%, left ventricle with moderately decreased function,  mildly reduced right ventricular systolic function, mitral valve repair with trivial mitral valve regurgitation, and trivial aortic valve regurgitation no other valvular abnormalities noted.   He is s/p CABG that has been complicated by stroke and prolonged intubation.    Assessment / Plan / Recommendation  Clinical Impression  Clinical swallowing evaluation was completed in setting of prolonged intubation.  RN approved patient for PO intake.  Cranial nerve exam was attempted and unable to be completed due to his issues following commands.  He was presented with ice chips and thin liquids via spoon.  There was concern for a possible pharyngeal dysphagia.  The oral phase appeared functional with good mastication of ice chips.  Swallow trigger was appreciated to palpation.  Pt with immediate cough response to thin liquids via spoon with coughing given ice chips following presentation of thin liquids.  Given patient history suggest he remain NPO strict.  ST will follow up for re assessment of his swallowing. SLP Visit Diagnosis: Dysphagia, unspecified (R13.10)    Aspiration Risk  Severe aspiration risk    Diet Recommendation NPO    Medication Administration: Via alternative means    Other  Recommendations Oral Care Recommendations: Oral care QID        Functional Status Assessment Patient has had a recent decline in their functional status and demonstrates the ability to make significant improvements in function in a reasonable and predictable amount of time.  Frequency and Duration min 2x/week  2 weeks       Prognosis Prognosis for improved oropharyngeal function: Good      Swallow Study   General Date of Onset: 03/17/24 HPI: Hudson Majkowski is a 68 year old male with a past medical history of CAD (s/p STEMI with PCI and DES to LAD 2004) followed by Dr. Anner, HTN, HLD, mitral valve prolapse (s/p mini mitral valve repair utilizing a 34mm Sorin Memo 3D Reochord ring annuloplasty by Dr.  Dusty 2015), SVT (s/p ablation 2021), and metastatic lung cancer (s/p palliative radiotherapy, now stable). The patient presented to the ED on 07/26 due to intermittent chest pain over the last week, he reports it started last Monday but he thought it was muscle soreness due to him starting to lift weights. The night of 07/25 he had chest pain that was evaluated by EMS but eventually resolved so he was not transported to the ED. On 07/26 he noted centralized sharp substernal chest pain will dull radiation to the rest of his body and numbness of bilateral arms to his elbows. He denies nausea and vomiting, shortness of breath, diaphoresis, and LOC. Nitroglycerin  provided some relief. He presented to the [facility] ED on 07/26 but was discharged home since troponin levels were flat and he had atypical chest discomfort. The chest pain again worsened on 07/28 so  he presented to urgent care and was directed to [facility] ED due to EKG with new T wave inversions. Troponin I (high sensitivity) peaked at 1469, he was ruled in for NSTEMI. Cardiac catheterization 03/10/24 showed 99% LAD in stent restenosis with 60% residual stenosis after balloon angioplasty, mid LAD 80% stenosis with residual 50% stenosis after balloon angioplasty, questionable ostial left circumflex 60-70% stenosis, RCA with ulcerated plaque and focal concentric heavily calcified 70% stenosis and the remainder of the RCA with mild diffuse disease. He was started on Cangrelor  and heparin  post PCI. The patient was given Effient  on 07/28 for cardiac catheterization and later started on Plavix  but it does not look like he was given a dose of Plavix , this has been discontinued. Echocardiogram on 07/28 showed LVEF 30-35%, left ventricle with moderately decreased function, mildly reduced right ventricular systolic function, mitral valve repair with trivial mitral valve regurgitation, and trivial aortic valve regurgitation no other valvular abnormalities  noted.   He is s/p CABG that has been complicated by stroke and prolonged intubation. Type of Study: Bedside Swallow Evaluation Previous Swallow Assessment: None noted at [facility]. Diet Prior to this Study: Cortrak/Small bore NG tube Temperature Spikes Noted: Yes Respiratory Status: Nasal cannula History of Recent Intubation: Yes Total duration of intubation (days): 7 days Date extubated: 03/23/24 Behavior/Cognition: Lethargic/Drowsy;Doesn't follow directions Oral Cavity Assessment: Within Functional Limits    Oral/Motor/Sensory Function Overall Oral Motor/Sensory Function: Other (comment) (Unable to assess)   Ice Chips Ice chips: Impaired Presentation: Spoon Oral Phase Impairments: Reduced labial seal Oral Phase Functional Implications: Right anterior spillage;Left anterior spillage Pharyngeal Phase Impairments: Cough - Immediate   Thin Liquid Thin Liquid: Impaired Presentation: Spoon Pharyngeal  Phase Impairments: Cough - Immediate    Nectar Thick Nectar Thick Liquid: Not tested   Honey Thick Honey Thick Liquid: Not tested   Puree Puree: Not tested   Solid     Solid: Not tested     Eleanor Eagles, MA, CCC-SLP Acute Rehab SLP [phone]  Eleanor LOISE Eagles 03/23/2024,1:53 PM

## 2024-03-23 NOTE — Progress Notes (Addendum)
 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  Admitted 7/28 w/ chest pain. Initially started 7/26 went to ER and CEs and EKG neg so went home but CP persisted so returned.  In ER on 28th + trop I, new T wave inversion in anterior lateral leads.  Treated w/ supplemental oxygen, morphine , started on heparin  gtt.  Went to cath lab  Findings:  99% -80% in-stent restenosis of LAD balloon angioplasty completed w/ partial restoration of flow (this re-stenosed stent was felt culprit lesion). RCA 45 to 70% stenosed, cx was 40%. Had nml LVEDP, no AS, referred to CVTS for CABG given 3V disease. and placed on plavix . EF from ECHO 30-35% w/ left regional wall motion abnormality.   Went to OR 8/4 for planned CABG x 3, as well as repair of ascending aortic dissection w/ vascular graft and repair of aortic valve  Time on bypass: ~ 4h73min EBL 1390 Cell saver 910 Received: FFP X2, PLTs 1 unit PRBC and cryo for on going surgical oozing Arrived in ICU w 480 ml bloody outpt from Chest tube Surgical team at bedside on arrival  Shortly after arrival to ICU had runs of VT and AF w/ RVR. Amiodarone  bolus and gtt initiated.  Got 2 gms calcium  another 2 units PLTs. TXA  Pertinent  Medical History  Prior CAD w/ DES X 2 and prior minimally invasive MVR 2015, NSCLC q/ mets to bone (2023) s/p chemo and XRT w/ most recent scans showing stable non-active disease  NSTEMI, HLD Significant Hospital Events: Including procedures, antibiotic start and stop dates in addition to other pertinent events   7/28 admitted w/ CP and acute inf/lat MI, cardiac cath svr 3V disease w/ re-stenosed LAD stent and  incomplete restoration of coronary flow. EF 30-35% referred for CABG 8/4 to OR. CABG x 3 v but as completing CABG noted blood loss and intra-op ECHO showed dissection of Aorta. So underwent AV graft placement and AVR. Large EBL in OR received several blood products. Immediate post op w/ large vol CT output, runs of VT and SVT. Got 2gms calcium , amiodarone  and placed on overdrive pacing via pacer critical care at bedside assisting w/ support  8/5 myoclonus; normal head CT& CTA, EEG started. Loaded with keppra .  8/6 remained on EEG, weaned off propofol  8/7 woke up, followed commands 8/8 started pneumonia antibiotics 8/9 failed SBT; milrinone  discontinued  Interim History / Subjective:  Overnight still febrile, no acute events  Objective    Blood pressure 111/70, pulse 96, temperature (!) 100.4 F (38 C), temperature source Bladder, resp. rate 20, height 6' 4 (1.93 m), weight 86 kg, SpO2 98%. CVP:  [3 mmHg-13 mmHg] 8 mmHg  Vent Mode: PRVC FiO2 (%):  [40 %-50 %] 40 % Set Rate:  [16 bmp] 16 bmp Vt Set:  [620 mL] 620 mL PEEP:  [5 cmH20] 5 cmH20 Pressure Support:  [8 cmH20] 8 cmH20 Plateau Pressure:  [18 cmH20-23 cmH20] 23 cmH20   Intake/Output Summary (Last 24 hours) at 03/23/2024 0722 Last data filed at 03/23/2024 0600 Gross per 24 hour  Intake 2960.19 ml  Output 4608 ml  Net -1647.81 ml  Filed Weights   03/21/24 0400 03/22/24 0500 03/23/24 0500  Weight: 77.9 kg 85.7 kg 86 kg    Examination: General: critically ill appearing man lying in bed in NAD  HENT: [place]/AT, eyes anicteric Lungs:  breathing comfortably on MV, rhales R base. Small volume tan secretions from ETT. Small intermittent air leak on the left. Cardiovascular: S1S2, RRR Abdomen: soft, NT Extremities: persistent edema, bruising from previous incisions  GU: clear yellow urine  Coox 52% Na+ 152 K+ 3.6 BUN 45 Cr 0.86 WBC 9.6 H/H  9.3/29.6 Platelets 55 Coox 78%  Respiratory culture: PMNs, abundant GNR & GPC>  moderate H parainfluenzae, B-lactamase neg.  Resolved problem list   Assessment and Plan   3V CAD and restenosis of DES now s/p CABG x 3 V w/ intraoperative finding of ascending aorta dissection requiring aortic graft and AVR Acute on chronic HFrEF due to ICM with cardiogenic shock. H/o HLD, HLD and CAD w/ severe mitral valve disease. Prior stents and mini- mitral valve repair (2015) for severe MR. 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 parainfluenzae -LTVV -VAP prevention protocol -PAD protocol for sedation -con't levofloxacin , vanc. Would prefer to avoid linezolid  due to low platelets. Vanc level today, dosing per Pharmacy team. H/o anaphylaxis to PCN. -daily SAT & SBT; hopeful for extubation today -con't vanc & levofloxacin  -CPT for mucus clearance w/ 3% saline nebs  Small left pneumothorax > enlarging slightly, residual right hemothorax> resolved -CXR this morning -con't left chest tube  H/o RLL stage IV lung adenocarcinoma w/ bone mets. Last PET w/ no  active disease- in remission since 2023. Possible new sclerotic lesion on CXR with small LUL nodule. -eventually needs CT scan for further staging  Hyperglycemia; A1c 5.0 -con't semglee  15 units daily -SSI PRN -goal BG 140-180  Hypernatremia -FWF  At risk for malnutrition -TF via cortrak  Anticipate a prolonged admission and need for rehabilitation post-hospitalization.   Best Practice (right click and Reselect all SmartList Selections daily)   Diet/type: tubefeeds DVT prophylaxis SCD Pressure ulcer(s): N/A GI prophylaxis: PPI Lines: Central line, Arterial Line, and yes and it is still needed Foley:  Yes, and it is still needed Code Status:  full code Last date of multidisciplinary goals of care discussion [per primary ]  Labs   CBC: Recent Labs  Lab 03/19/24 1000 03/20/24 0413 03/21/24 0402 03/22/24 0420 03/23/24 0428  WBC 9.1 8.4 6.9 9.7 9.6  HGB 9.8* 9.9* 9.3* 9.5* 9.3*  HCT 28.4* 30.5* 28.5* 29.1* 29.6*  MCV 89.3 92.1 93.1 95.1 97.7  PLT 67* 48* 41* 50* 55*    Basic Metabolic Panel: Recent Labs  Lab 03/18/24 1130 03/18/24 1641 03/19/24 0226 03/19/24 1000 03/20/24 0413 03/21/24 0402  03/22/24 0420 03/23/24 0428  NA 141 141 139 139 139 142 145 152*  K 4.5 4.2 4.0 4.2 3.9 3.6 3.7 3.6  CL 109 108 107 108 108 110 111 114*  CO2 22 23 22 22 26 25 25 27   GLUCOSE 133* 119* 161* 171* 161* 145* 123* 136*  BUN 18 24* 34* 40* 49* 52* 49* 45*  CREATININE 1.06 1.19 1.17 1.18 1.11 0.90 0.82 0.86  CALCIUM  8.0* 7.7* 7.8* 7.8* 7.8* 7.8* 7.8* 7.5*  MG 2.6* 2.5* 2.6*  --  2.6* 2.6* 2.6*  --   PHOS 4.7*  --  4.4  --  2.9 2.6  --   --     ABG    Component Value Date/Time   PHART 7.409 03/17/2024 2158   PCO2ART 32.7 03/17/2024 2158   PO2ART 80 (L) 03/17/2024 2158   HCO3 20.7 03/17/2024 2158   TCO2 22 03/17/2024 2158   ACIDBASEDEF 3.0 (H) 03/17/2024 2158   O2SAT 52.3 03/23/2024 0428     Coagulation Profile: Recent Labs  Lab 03/17/24 1446 03/17/24 1615  03/19/24 0222  INR 2.3* 1.8* 1.5*     Critical care time:    This patient is critically ill with multiple organ system failure which requires frequent high complexity decision making, assessment, support, evaluation, and titration of therapies. This was completed through the application of advanced monitoring technologies and extensive interpretation of multiple databases. During this encounter critical care time was devoted to patient care services described in this note for 45 minutes.   Leita SHAUNNA Gaskins, DO 03/23/24 8:37 AM [facility]  For contact information, see Amion. If no response to pager, please call PCCM consult pager. After hours, 7PM- 7AM, please call Elink.

## 2024-03-23 NOTE — Progress Notes (Signed)
 NEUROLOGY CONSULT FOLLOW UP NOTE   Date of service: March 23, 2024 Patient Name: Alan [name] MRN:  [id number] DOB:  06-Feb-1956  Interval Hx/subjective  Seen and examined. No fever at this time Somewhat more awake.  Vitals   Vitals:   03/23/24 0600 03/23/24 0700 03/23/24 0748 03/23/24 0800  BP: 129/85 111/70  131/64  Pulse: (!) 103 96  (!) 101  Resp: (!) 22 20  (!) 24  Temp: 100 F (37.8 C) (!) 100.4 F (38 C)  (!) 100.6 F (38.1 C)  TempSrc: Bladder Bladder  Bladder  SpO2: 100% 98% 100% 100%  Weight:      Height:         Body mass index is 23.08 kg/m.  Physical Exam   General: Well-developed well-nourished man, no sedation, intubated HEENT: Normocephalic atraumatic Lungs: Clear/vented Cardiovascular: Regular rate rhythm Neurological exam Intubated No sedation Eyes open and awake Follows simple commands Attempts to mouth words Cranial nerves: Pupils equal round reactive, extraocular movements unhindered, visual fields appear full as evidenced by equal blink to threat from both sides, face grossly symmetric within the constraints of examination of an intubated patient. Motor examination with antigravity strength in the left upper extremity, barely antigravity in the right upper extremity.  Right lower extremity is much stronger than the left lower extremity. Sensory examination-appears to be able to perceive light touch symmetrically Coordination difficult to assess   Medications  Current Facility-Administered Medications:    0.9 %  sodium chloride  infusion (Manually program via Guardrails IV Fluids), , Intravenous, Once, Kerrin Elspeth BROCKS, MD   0.9 %  sodium chloride  infusion (Manually program via Guardrails IV Fluids), , Intravenous, Once, Jenna Maude BRAVO, NP   acetaminophen  (TYLENOL ) tablet 650 mg, 650 mg, Oral, Q6H, Gretta, Laura P, DO   albumin  human 5 % solution 12.5 g, 250 mL, Intravenous, Q15 min PRN, Gold, Wayne E, PA-C, Stopped at 03/18/24 0335    amiodarone  (PACERONE ) tablet 200 mg, 200 mg, Oral, Daily, Bensimhon, Jorrell R, MD, 200 mg at 03/22/24 9057   aspirin  EC tablet 325 mg, 325 mg, Oral, Daily **OR** aspirin  chewable tablet 324 mg, 324 mg, Per Tube, Daily, Gold, Wayne E, PA-C, 324 mg at 03/22/24 9057   atorvastatin  (LIPITOR) tablet 80 mg, 80 mg, Per Tube, Daily, Salam, Savannah B, RPH, 80 mg at 03/22/24 0941   bisacodyl  (DULCOLAX) EC tablet 10 mg, 10 mg, Oral, Daily, 10 mg at 03/19/24 0918 **OR** bisacodyl  (DULCOLAX) suppository 10 mg, 10 mg, Rectal, Daily, Gold, Wayne E, PA-C, 10 mg at 03/21/24 1035   Chlorhexidine  Gluconate Cloth 2 % PADS 6 each, 6 each, Topical, Daily, Kerrin Elspeth BROCKS, MD, 6 each at 03/22/24 1056   docusate (COLACE) 50 MG/5ML liquid 100 mg, 100 mg, Per Tube, BID, Gretta Leita SQUIBB, DO, 100 mg at 03/22/24 0944   famotidine  (PEPCID ) IVPB 20 mg premix, 20 mg, Intravenous, Q12H, Gretta Leita SQUIBB, DO, Stopped at 03/22/24 2148   feeding supplement (PROSource TF20) liquid 60 mL, 60 mL, Per Tube, BID, Gretta Leita SQUIBB, DO, 60 mL at 03/22/24 2118   feeding supplement (VITAL 1.5 CAL) liquid 1,000 mL, 1,000 mL, Per Tube, Continuous, Gretta Leita SQUIBB, DO, Last Rate: 55 mL/hr at 03/23/24 0600, Infusion Verify at 03/23/24 0600   fentaNYL  (SUBLIMAZE ) injection 50 mcg, 50 mcg, Intravenous, Q15 min PRN, Gretta Leita SQUIBB, DO, 50 mcg at 03/19/24 1625   fentaNYL  (SUBLIMAZE ) injection 50-200 mcg, 50-200 mcg, Intravenous, Q30 min PRN, Gretta Leita P, DO, 50 mcg  at 03/22/24 1137   free water  200 mL, 200 mL, Per Tube, Q4H, Gretta Doffing P, DO, 200 mL at 03/23/24 9170   furosemide  (LASIX ) injection 80 mg, 80 mg, Intravenous, Q8H, Clark, Laura P, DO   hydrALAZINE  (APRESOLINE ) injection 10-20 mg, 10-20 mg, Intravenous, Q2H PRN, Lightfoot, Linnie KIDD, MD, 20 mg at 03/22/24 0154   insulin  aspart (novoLOG ) injection 0-24 Units, 0-24 Units, Subcutaneous, Q4H, Gretta Doffing SQUIBB, DO, 2 Units at 03/23/24 9161   insulin  glargine-yfgn (SEMGLEE ) injection 15  Units, 15 Units, Subcutaneous, Daily, Gretta, Laura P, DO   ipratropium-albuterol  (DUONEB) 0.5-2.5 (3) MG/3ML nebulizer solution 3 mL, 3 mL, Nebulization, Q4H PRN, Gretta Doffing SQUIBB, DO   levETIRAcetam  (KEPPRA ) undiluted injection 1,000 mg, 1,000 mg, Intravenous, Q12H, Kerrianne Jeng, MD, 1,000 mg at 03/22/24 2033   levofloxacin  (LEVAQUIN ) IVPB 750 mg, 750 mg, Intravenous, Q24H, Gretta Doffing SQUIBB, DO, Stopped at 03/22/24 1116   midazolam  (VERSED ) injection 2 mg, 2 mg, Intravenous, Q1H PRN, Gold, Wayne E, PA-C, 2 mg at 03/20/24 1047   milrinone  (PRIMACOR ) 20 MG/100 ML (0.2 mg/mL) infusion, 0.125 mcg/kg/min, Intravenous, Continuous, Clark, Laura P, DO   norepinephrine  (LEVOPHED ) 16 mg in (0.064 mg/mL) premix infusion, 0-40 mcg/min, Intravenous, Titrated, Kerrin Elspeth BROCKS, MD, Stopped at 03/19/24 1013   ondansetron  (ZOFRAN ) injection 4 mg, 4 mg, Intravenous, Q6H PRN, Viviane Lemond BRAVO, PA-C   Oral care mouth rinse, 15 mL, Mouth Rinse, Q2H, Kerrin Elspeth BROCKS, MD, 15 mL at 03/23/24 0800   oxyCODONE  (Oxy IR/ROXICODONE ) immediate release tablet 5-10 mg, 5-10 mg, Per Tube, Q3H PRN, Salam, Savannah B, RPH, 5 mg at 03/19/24 1610   polyethylene glycol (MIRALAX  / GLYCOLAX ) packet 17 g, 17 g, Per Tube, Daily, Gretta Doffing P, DO, 17 g at 03/22/24 0944   potassium chloride  (KLOR-CON ) packet 20 mEq, 20 mEq, Per Tube, Q4H, Kerrin Elspeth BROCKS, MD, 20 mEq at 03/23/24 9396   propofol  (DIPRIVAN ) 1000 MG/100ML infusion, 0-80 mcg/kg/min, Intravenous, Continuous, Gretta Doffing SQUIBB, DO, Stopped at 03/19/24 1039   sodium chloride  flush (NS) 0.9 % injection 10-40 mL, 10-40 mL, Intracatheter, Q12H, Kerrin Elspeth BROCKS, MD, 10 mL at 03/22/24 2124   sodium chloride  flush (NS) 0.9 % injection 3 mL, 3 mL, Intravenous, Q12H, Gold, Wayne E, PA-C, 3 mL at 03/22/24 2123   sodium chloride  flush (NS) 0.9 % injection 3 mL, 3 mL, Intravenous, PRN, Gold, Wayne E, PA-C, 3 mL at 03/21/24 0818   sodium chloride  HYPERTONIC 3 % nebulizer  solution 4 mL, 4 mL, Nebulization, Q4H, Clark, Laura P, DO, 4 mL at 03/23/24 0747   vancomycin  (VANCOCIN ) IVPB 1000 mg/200 mL premix, 1,000 mg, Intravenous, LENNETTE Serena Morna JONELLE, [facility]  Labs and Diagnostic Imaging   CBC:  Recent Labs  Lab 03/22/24 0420 03/23/24 0428  WBC 9.7 9.6  HGB 9.5* 9.3*  HCT 29.1* 29.6*  MCV 95.1 97.7  PLT 50* 55*    Basic Metabolic Panel:  Lab Results  Component Value Date   NA 152 (H) 03/23/2024   K 3.6 03/23/2024   CO2 27 03/23/2024   GLUCOSE 136 (H) 03/23/2024   BUN 45 (H) 03/23/2024   CREATININE 0.86 03/23/2024   CALCIUM  7.5 (L) 03/23/2024   GFRNONAA >60 03/23/2024   GFRAA >60 05/12/2020   Lipid Panel:  Lab Results  Component Value Date   LDLCALC 58 03/12/2024   HgbA1c:  Lab Results  Component Value Date   HGBA1C 5.0 03/12/2024   INR  Lab Results  Component Value Date  INR 1.5 (H) 03/19/2024   APTT  Lab Results  Component Value Date   APTT 35 03/19/2024  LDL 58, A1c 5.8  CT Head without contrast(Personally reviewed): No acute abnormality, atrophy and chronic small vessel ischemic disease  CT angio Head and Neck with contrast(Personally reviewed): Dissection of the aortic arch and descending thoracic aorta with edema surrounding the proximal left subclavian artery, mild irregular stenosis of M1 segments of bilateral MCAs, mild left pneumothorax and bilateral pleural effusions  MRI Brain(Personally reviewed): Multiple small infarcts involving different vascular territories suggesting a central embolic source.  Multiple small foci of chronic microhemorrhage-more numerous than July 06, 2023.  No evidence of anoxic brain damage  Continuous EEG 03/19/2024 to 03/20/2024-diffuse encephalopathy.  Event button pressed on 03/19/2024 multiple times of bilateral lower extremity twitching as well as shoulder tracing without definitive EEG changes.  Those events were not epileptic.  Subcortical myoclonus can have similar  appearance.   Assessment   FINIAN HELVEY is a 68 y.o. male with history of CAD, hypertension, hyperlipidemia, mitral valve prolapse status postrepair, SVT status post ablation, metastatic small cell lung cancer in remission who was originally admitted after a non-STEMI and underwent CABG which was complicated by intraoperative aortic dissection.  After sedation was held, he was noted to have a lot of twitching with LPD's on the EEG along with myoclonic status epilepticus-2 episodes.  Hypoxic/anoxic brain injury is suspected but with weaning sedation, status started to improve when he is following midline commands.  MRI revealed very small volume scattered embolic infarcts keeping in line with the intraoperative dissection complication. His mentation worsened somewhat due to possible pneumonia. He is somewhat more improved today in comparison to the prior 2 days Overall picture has some contribution from strokes but is primarily encephalopathy in the setting of acute infection and acute illness--multifactorial encephalopathy.  Impression Acute ischemic infarcts in the setting of aortic dissection as a complication of CABG Toxic metabolic encephalopathy in the setting of acute pneumonia  Recommendations  Continue supportive care Continue frequent neurochecks Antiplatelet per CTS-on aspirin  A1c and LDL within goal.  Statin for LDL less than 70. Expect improvement in mentation with resolution of pneumonia  Discussed with Dr. Gretta.   -- Eligio Lav, MD Neurologist [facility] Pager: [phone]

## 2024-03-23 NOTE — Progress Notes (Signed)
 Advanced Heart Failure Rounding Note  Cardiologist: [name], MD   Chief Complaint: Post-op CABG and Bentall procedure   Subjective:    POD #6  Milrinone  stopped yesterday due to concerns for developing septic physiology.   Co-ox dropped to 52%. Milrinone  restarted.   Extubated this am. Very weak but able to answer questions. Extremely weak   Objective:   Weight Range: 86 kg Body mass index is 23.08 kg/m.   Vital Signs:   Temp:  [99.5 F (37.5 C)-101.7 F (38.7 C)] 100.6 F (38.1 C) (08/10 1000) Pulse Rate:  [93-109] 106 (08/10 1000) Resp:  [17-30] 26 (08/10 1000) BP: (110-149)/(52-85) 149/75 (08/10 1000) SpO2:  [93 %-100 %] 93 % (08/10 1011) Arterial Line BP: (115-172)/(40-84) 172/60 (08/10 1000) FiO2 (%):  [40 %] 40 % (08/10 0748) Weight:  [86 kg] 86 kg (08/10 0500) Last BM Date : 03/22/24  Weight change: Filed Weights   03/21/24 0400 03/22/24 0500 03/23/24 0500  Weight: 77.9 kg 85.7 kg 86 kg    Intake/Output:   Intake/Output Summary (Last 24 hours) at 03/23/2024 1026 Last data filed at 03/23/2024 0900 Gross per 24 hour  Intake 2840.52 ml  Output 4433 ml  Net -1592.48 ml      Physical Exam    General:  Sitting up in bed. Ill appearing. + cough HEENT: normal Neck: supple. JVP 6-7  Cor: Regular rate & rhythm. No rubs, gallops or murmurs. Lungs: coarse Abdomen: soft, nontender, nondistended. No hepatosplenomegaly. No bruits or masses. Good bowel sounds. Extremities: no cyanosis, clubbing, rash, edema Neuro: alert & oriented diffusely weak   Telemetry   Sinus 95-110 Personally reviewed   Labs    CBC Recent Labs    03/22/24 0420 03/23/24 0428  WBC 9.7 9.6  HGB 9.5* 9.3*  HCT 29.1* 29.6*  MCV 95.1 97.7  PLT 50* 55*   Basic Metabolic Panel Recent Labs    91/91/74 0402 03/22/24 0420 03/23/24 0428  NA 142 145 152*  K 3.6 3.7 3.6  CL 110 111 114*  CO2 25 25 27   GLUCOSE 145* 123* 136*  BUN 52* 49* 45*  CREATININE 0.90  0.82 0.86  CALCIUM  7.8* 7.8* 7.5*  MG 2.6* 2.6*  --   PHOS 2.6  --   --      Medications:     Scheduled Medications:  sodium chloride    Intravenous Once   sodium chloride    Intravenous Once   acetaminophen   650 mg Oral Q6H   amiodarone   200 mg Oral Daily   aspirin  EC  325 mg Oral Daily   Or   aspirin   324 mg Per Tube Daily   atorvastatin   80 mg Per Tube Daily   bisacodyl   10 mg Oral Daily   Or   bisacodyl   10 mg Rectal Daily   Chlorhexidine  Gluconate Cloth  6 each Topical Daily   docusate  100 mg Per Tube BID   feeding supplement (PROSource TF20)  60 mL Per Tube BID   free water   200 mL Per Tube Q4H   furosemide   80 mg Intravenous Q8H   insulin  aspart  0-24 Units Subcutaneous Q4H   insulin  glargine-yfgn  15 Units Subcutaneous Daily   levETIRAcetam   1,000 mg Intravenous Q12H   mouth rinse  15 mL Mouth Rinse Q2H   polyethylene glycol  17 g Per Tube Daily   potassium chloride   20 mEq Per Tube Q4H   sodium chloride  flush  10-40 mL Intracatheter Q12H  sodium chloride  flush  3 mL Intravenous Q12H   sodium chloride  HYPERTONIC  4 mL Nebulization Q4H    Infusions:  albumin  human Stopped (03/18/24 0335)   famotidine  (PEPCID ) IV Stopped (03/22/24 2148)   feeding supplement (VITAL 1.5 CAL) 55 mL/hr at 03/23/24 0900   levofloxacin  (LEVAQUIN ) IV Stopped (03/22/24 1116)   milrinone  0.125 mcg/kg/min (03/23/24 0930)   norepinephrine  (LEVOPHED ) Adult infusion Stopped (03/19/24 1013)   propofol  (DIPRIVAN ) infusion Stopped (03/19/24 1039)   vancomycin  1,000 mg (03/23/24 0954)    PRN Medications: albumin  human, hydrALAZINE , ipratropium-albuterol , midazolam , ondansetron  (ZOFRAN ) IV, oxyCODONE , sodium chloride  flush    Patient Profile  68 y.o. male with history of CAD, severe MR s/p minimally invasive mitral valve repair, stage III NSCLC.   Patient admitted with NSTEMI and new systolic CHF. Underwent 3v CABG X 3 c/b ascending aortic dissection.  Assessment/Plan   1. NSTEMI with  known CAD -Remote anterior STEMI with prior stents to LAD -NSTEMI - 3 V CAD on cath with 99% in-stent restenosis in LAD, 50% residual stenosis post PTCA -CABG X 3 (LIMA to LAD, SVG to OM, SBVG to PDA) 03/17/24 as above -Aspirin  + statin.  - No s/s angina  2. Ascending Aortic Dissection  - complication of CABG - s/p Bentall and resuspenison of AoV  - stable   3. Acute systolic CHF/ICM -Newly reduced EF -Echo 03/10/24: EF 30-35%, anterior WMA, RV mildly reduced -Post CABG limited echo 8/7: EF improving, 40-45% w/ septal dyssynchrony  -Co-ox dropped to 52% off milrinone . Milrinone  resumed. Will continue -Continue IV Lasix  80 mg - will give 2 doses today  4. Acute hypoxic resp failure/ Suspected PNA  -Febrile w/ thick ETT secretions. Tracheal aspirate with parainfluenzae + GPCs -On vancomycin  and levaquin  (PCN allergy) - Extubated today. Very weak. High risk for reintubation - Will need swallow study/Speech Rx  5. Severe MR s/p minimally invasive mitral valve repair -trivial MR with mean gradient of 4 mmhg on echo this admit   6. Postop anemia/thrombocytopenia -Multiple blood products in OR and immediately post-op -Hgb improved and remains stable at 9.3 -Platelets mildly improved, 50K -> 55K -Follow closely -Has not been on heparin  post-op. Holding DVT prophylaxis.  7. NSVT -No recurrence -Continue amiodarone  gtt for now. Switch to po once passes swallow study   8. NSCLC w/bone mets -S/p chemoradiation and immunotherapy -Stable disease on most recent outpatinet imaging. Followed by Heme/Onc -Possible new sclerotic lesion/small LUL nodule on CXR this admission. Will eventually require CT.  9. Myoclonic seizures - Resolved. Suspect hypoxic/anoxic brain injury - Neurology following - Off sedation and following commands.  - Keppra  - MRI brain with multiple small infarcts  10. Left pneumothorax - stable on CXR today  - per CT surgery keep left chest tube   CRITICAL  CARE Performed by: Toribio Fuel   Total critical care time: 42 minutes  Critical care time was exclusive of separately billable procedures and treating other patients.  Critical care was necessary to treat or prevent imminent or life-threatening deterioration.  Critical care was time spent personally by me on the following activities: development of treatment plan with patient and/or surrogate as well as nursing, discussions with consultants, evaluation of patient's response to treatment, examination of patient, obtaining history from patient or surrogate, ordering and performing treatments and interventions, ordering and review of laboratory studies, ordering and review of radiographic studies, pulse oximetry and re-evaluation of patient's condition.

## 2024-03-23 NOTE — Progress Notes (Signed)
 Inpatient Rehab Admissions Coordinator Note:   Per PT patient was screened for CIR candidacy by Asad Keeven SHAUNNA Yvone Cohens, CCC-SLP. At this time pt does not appear to be able to tolerate the intensity of CIR. Pt may have potential to progress to becoming a potential CIR candidate. CIR admissions team will follow to monitor progress and participation with therapies. A consult order will be placed if pt appears to be an appropriate candidate.  Tinnie Yvone Cohens, MS, CCC-SLP Admissions Coordinator [phone] 03/23/24 3:45 PM

## 2024-03-23 NOTE — Progress Notes (Signed)
 Small mostly subpulmonic effusion on the R, mostly has consolidated lung. No window to safely drain effusion.   Passed 2hr SBT. Extubate-- d/w RN & RT.   Leita SHAUNNA Gaskins, DO 03/23/24 9:38 AM [facility]  For contact information, see Amion. If no response to pager, please call PCCM consult pager. After hours, 7PM- 7AM, please call Elink.

## 2024-03-23 NOTE — Progress Notes (Signed)
 Pharmacy Antibiotic Note  Alan [name] is a 68 y.o. male admitted on 03/10/2024 with pneumonia.  Pharmacy has been consulted for Vancomycin  dosing.  WBC 6.9, febrile to 101.30F. When suctioned at bedise, MD found thick tan sputum. Remote history of anaphylaxis to penicillins, CCM MD prefers to avoid cefepime at this time and cover pseudomonas with levofloxacin .  Vanc dose: 1000 mg IV q12h @ 0954 Vanc peak: 23 @ 1210 Vanc trough 8 @2200  AUC 370 goal 500 > increase vancomycin  1250mg  IV q12h Plan: Vancomycin  1,250mg  IV q12h Levofloxacin  750mg  IV q24h Monitor levels as indicated, renal function, culture data   Height: 6' 4 (193 cm) Weight: 86 kg (189 lb 9.5 oz) IBW/kg (Calculated) : 86.8  Temp (24hrs), Avg:100.2 F (37.9 C), Min:99.5 F (37.5 C), Max:100.8 F (38.2 C)  Recent Labs  Lab 03/19/24 1000 03/20/24 0413 03/21/24 0402 03/22/24 0420 03/23/24 0428 03/23/24 1210 03/23/24 2203  WBC 9.1 8.4 6.9 9.7 9.6  --   --   CREATININE 1.18 1.11 0.90 0.82 0.86  --   --   VANCOTROUGH  --   --   --   --   --   --  8*  VANCOPEAK  --   --   --   --   --  23*  --     Estimated Creatinine Clearance: 101.4 mL/min (by C-G formula based on SCr of 0.86 mg/dL).    Allergies  Allergen Reactions   A-Cillin [Ampicillin] Shortness Of Breath, Swelling and Rash    Did it involve swelling of the face/tongue/throat, SOB, or low BP? Yes Did it involve sudden or severe rash/hives, skin peeling, or any reaction on the inside of your mouth or nose? Yes Did you need to seek medical attention at a hospital or doctor's office? Yes When did it last happen? ~20 years ago If all above answers are NO, may proceed with cephalosporin use.     Amoxicillin Hives, Shortness Of Breath and Swelling    Did it involve swelling of the face/tongue/throat, SOB, or low BP? Yes Did it involve sudden or severe rash/hives, skin peeling, or any reaction on the inside of your mouth or nose? Yes Did you need to seek  medical attention at a hospital or doctor's office? Yes When did it last happen? ~20 years ago If all above answers are NO, may proceed with cephalosporin use.    Other Hives, Shortness Of Breath and Swelling    ALL CILLINS   Penicillins Hives, Shortness Of Breath and Swelling    Did it involve swelling of the face/tongue/throat, SOB, or low BP? Yes Did it involve sudden or severe rash/hives, skin peeling, or any reaction on the inside of your mouth or nose? Yes Did you need to seek medical attention at a hospital or doctor's office? Yes When did it last happen? ~20 years ago If all above answers are NO, may proceed with cephalosporin use.     Sulfa Antibiotics Nausea Only   Crestor  [Rosuvastatin ] Other (See Comments)    G I upset    Antimicrobials this admission: Levofloxacin  perioperatively Vancomycin  perioperatively Vancomycin  8/8 >> Levofloxacin  8/8 >>  Microbiology results: 8/8 Sputum: sent  8/8 MRSA PCR: sent    Olam Chalk Pharm.D. CPP, BCPS Clinical Pharmacist [phone] 03/23/2024 10:54 PM

## 2024-03-23 NOTE — Progress Notes (Signed)
 [address]       [place] [address]             [phone]                 6 Days Post-Op Procedure(s) (LRB): CORONARY ARTERY BYPASS GRAFTING TIMES THREE , USING LEFT INTERNAL MAMMARY ARTERY AND ENDOSCOPIC HARVESTED RIGHT SAPHENOUS VEIN (N/A) ECHOCARDIOGRAM, TRANSESOPHAGEAL, INTRAOPERATIVE (N/A) REPAIR, AORTIC DISSECTION, ASCENDING USING 30 MM HEMASHIELD PLATINUM VASCULAR GRAFT RESUSPENSION OF AORTIC VALVE   Events: No events Extubated this am _______________________________________________________________ Vitals: BP (!) 149/75   Pulse (!) 106   Temp (!) 100.6 F (38.1 C) (Bladder)   Resp (!) 26   Ht 6' 4 (1.93 m)   Wt 86 kg   SpO2 93%   BMI 23.08 kg/m  Filed Weights   03/21/24 0400 03/22/24 0500 03/23/24 0500  Weight: 77.9 kg 85.7 kg 86 kg     - Neuro: arouable   - Cardiovascular: sinus  Drips: milr 0.125.   CVP:  [3 mmHg-13 mmHg] 5 mmHg  - Pulm: + air leak  ABG    Component Value Date/Time   PHART 7.409 03/17/2024 2158   PCO2ART 32.7 03/17/2024 2158   PO2ART 80 (L) 03/17/2024 2158   HCO3 20.7 03/17/2024 2158   TCO2 22 03/17/2024 2158   ACIDBASEDEF 3.0 (H) 03/17/2024 2158   O2SAT 52.3 03/23/2024 0428    - Abd: ND - Extremity: warm  .Intake/Output      08/09 0701 08/10 0700 08/10 0701 08/11 0700   I.V. (mL/kg) 114.8 (1.3)    Other 30    NG/GT 1645 365   IV Piggyback 1170.4 10   Total Intake(mL/kg) 2960.2 (34.4) 375 (4.4)   Urine (mL/kg/hr) 4090 (2) 260 (0.9)   Stool 80    Chest Tube 438 30   Total Output 4608 290   Net -1647.8 +85        Stool Occurrence 1 x       _______________________________________________________________ Labs:    Latest Ref Rng & Units 03/23/2024    4:28 AM 03/22/2024    4:20 AM 03/21/2024    4:02 AM  CBC  WBC 4.0 - 10.5 K/uL 9.6  9.7  6.9   Hemoglobin 13.0 - 17.0 g/dL 9.3  9.5  9.3   Hematocrit 39.0 - 52.0 % 29.6  29.1  28.5   Platelets 150 - 400 K/uL 55  50  41       Latest Ref  Rng & Units 03/23/2024    4:28 AM 03/22/2024    4:20 AM 03/21/2024    4:02 AM  CMP  Glucose 70 - 99 mg/dL 863  876  854   BUN 8 - 23 mg/dL 45  49  52   Creatinine 0.61 - 1.24 mg/dL 9.13  9.17  9.09   Sodium 135 - 145 mmol/L 152  145  142   Potassium 3.5 - 5.1 mmol/L 3.6  3.7  3.6   Chloride 98 - 111 mmol/L 114  111  110   CO2 22 - 32 mmol/L 27  25  25    Calcium  8.9 - 10.3 mg/dL 7.5  7.8  7.8     CXR: stable  _______________________________________________________________  Assessment and Plan: POD 6 s/p CABG, dissection repair.  Neuro: nonfocal, but weak CV: wean milr as tolerated.  Treating HTN.   Pulm: extubated.  Will follow-air leak not that he is extubated Renal: creat stable GI: tube feeds  Heme: stable ID: [email or web address] Endo: SSI Dispo: continue ICU care   Alan [name] 03/23/2024 10:33 AM

## 2024-03-23 NOTE — Evaluation (Signed)
 Physical Therapy Evaluation Patient Details Name: Alan [name] MRN: [id number] DOB: Dec 17, 1955 Today's Date: 03/23/2024  History of Present Illness  Pt is 69 yo presenting to [facility] on 8[facility]4 for planned CABG x 3 and repair of ascending aortic dissection w/vascular graft and repair of aortic valve. Pt found to have myoclonus on 8/5. Diffuse encaphalopathy. MRI with very small volume scattered embolic infarcts. Suspected pneumonia. Pmh: CAD, HTN, hyperlipidemia, mitral valve prolapse s/p repair, SVT s/p ablation, metastatic small cell lung cancer in remission, Non STEMI  Clinical Impression  Pt is presenting below baseline level of functioning. Currently pt is total A for bed mobility and Mod to Max A for sitting EOB. Pt was recently extubated this morning and lethargic/weak from recent surgery and CVA's. Pt expected to progress. Spouse is very supportive and available 24/7. Due to pt current functional status, home set up and available assistance at home recommending skilled physical therapy services > 3 hours/day in order to address strength, balance and functional mobility to decrease risk for falls, injury, immobility, skin break down and re-hospitalization.          If plan is discharge home, recommend the following: Two people to help with walking and/or transfers;Assistance with cooking/housework;Help with stairs or ramp for entrance;Assist for transportation;Supervision due to cognitive status     Equipment Recommendations Wheelchair cushion (measurements PT);Wheelchair (measurements PT);Hoyer lift;Hospital bed  Recommendations for Other Services  Rehab consult    Functional Status Assessment Patient has had a recent decline in their functional status and demonstrates the ability to make significant improvements in function in a reasonable and predictable amount of time.     Precautions / Restrictions Precautions Precautions: Fall;Sternal Precaution Booklet Issued: No Recall of  Precautions/Restrictions: Impaired Precaution/Restrictions Comments: a-line R wrist, thermal sensor for catheter and thermal pad, Cortrak Restrictions Weight Bearing Restrictions Per Provider Order: Yes RUE Weight Bearing Per Provider Order: Non weight bearing LUE Weight Bearing Per Provider Order: Non weight bearing      Mobility  Bed Mobility Overal bed mobility: Needs Assistance Bed Mobility: Supine to Sit, Sit to Supine     Supine to sit: Total assist, +2 for physical assistance, +2 for safety/equipment Sit to supine: Total assist, +2 for physical assistance, +2 for safety/equipment   General bed mobility comments: Pt was able to help less than 10% with moving bil LE toward EOB. Requires assist to remain sitting EOB due to posterior lean    Transfers   General transfer comment: unable at this time.     Modified Rankin (Stroke Patients Only) Modified Rankin (Stroke Patients Only) Pre-Morbid Rankin Score: No symptoms Modified Rankin: Severe disability     Balance Overall balance assessment: Needs assistance Sitting-balance support: Bilateral upper extremity supported, Feet supported Sitting balance-Leahy Scale: Zero Sitting balance - Comments: Mod to Max A to maintain upright mid line posture with heavy posterior lean. Postural control: Posterior lean         Pertinent Vitals/Pain Pain Assessment Pain Assessment: Faces Faces Pain Scale: Hurts little more Facial Expression: Grimacing Body Movements: Absence of movements Muscle Tension: Relaxed Compliance with ventilator (intubated pts.): N/A Vocalization (extubated pts.): Talking in normal tone or no sound CPOT Total: 2 Pain Location: sternum Pain Descriptors / Indicators: Grimacing Pain Intervention(s): Monitored during session, Limited activity within patient's tolerance    Home Living Family/patient expects to be discharged to:: Private residence Living Arrangements: Spouse/significant other Available  Help at Discharge: Family;Available 24 hours/day Type of Home: House Home Access: Stairs to enter  Entrance Stairs-Rails: None Entrance Stairs-Number of Steps: 1   Home Layout: Able to live on main level with bedroom/bathroom Home Equipment: None Additional Comments: information collected per spouse    Prior Function Prior Level of Function : Independent/Modified Independent;Driving             Mobility Comments: Pt was active, going to the gym ADLs Comments: Ind with ADLs and IADL's.     Extremity/Trunk Assessment   Upper Extremity Assessment Upper Extremity Assessment: Defer to OT evaluation    Lower Extremity Assessment Lower Extremity Assessment: Generalized weakness    Cervical / Trunk Assessment Cervical / Trunk Assessment: Other exceptions Cervical / Trunk Exceptions: very weak, difficulty holding up head  Communication   Communication Communication: Impaired Factors Affecting Communication: Reduced clarity of speech    Cognition Arousal: Lethargic Behavior During Therapy: Flat affect   PT - Cognitive impairments: Orientation, Difficult to assess Difficult to assess due to: Level of arousal Orientation impairments: Time, Situation, Place   PT - Cognition Comments: Pt unsure of month, and states he is [facility]. Pt knows, name, year and that he is in [place] Following commands: Impaired Following commands impaired: Follows one step commands inconsistently, Follows one step commands with increased time     Cueing Cueing Techniques: Verbal cues, Tactile cues     General Comments General comments (skin integrity, edema, etc.): HR 103, O2 sats 94%, BP 111/62 initially, HR 111, O2 sats 93% and BP 105/54 at end of session.        Assessment/Plan    PT Assessment Patient needs continued PT services  PT Problem List Decreased strength;Decreased activity tolerance;Decreased balance;Decreased mobility       PT Treatment Interventions DME  instruction;Balance training;Gait training;Stair training;Functional mobility training;Therapeutic activities;Therapeutic exercise;Patient/family education;Neuromuscular re-education;Wheelchair mobility training    PT Goals (Current goals can be found in the Care Plan section)  Acute Rehab PT Goals Patient Stated Goal: to improve and return home PT Goal Formulation: With family Time For Goal Achievement: 04/06/24 Potential to Achieve Goals: Good    Frequency Min 2X/week        AM-PAC PT 6 Clicks Mobility  Outcome Measure Help needed turning from your back to your side while in a flat bed without using bedrails?: Total Help needed moving from lying on your back to sitting on the side of a flat bed without using bedrails?: Total Help needed moving to and from a bed to a chair (including a wheelchair)?: Total Help needed standing up from a chair using your arms (e.g., wheelchair or bedside chair)?: Total Help needed to walk in hospital room?: Total Help needed climbing 3-5 steps with a railing? : Total 6 Click Score: 6    End of Session Equipment Utilized During Treatment: Oxygen Activity Tolerance: Patient limited by lethargy;Patient tolerated treatment well Patient left: in bed;with call bell/phone within reach;with family/visitor present;with nursing/sitter in room Nurse Communication: Mobility status (discoloration at bil toes and redness at testicles) PT Visit Diagnosis: Other abnormalities of gait and mobility (R26.89);Muscle weakness (generalized) (M62.81)    Time: 8858-8787 PT Time Calculation (min) (ACUTE ONLY): 31 min   Charges:   PT Evaluation $PT Eval Low Complexity: 1 Low PT Treatments $Therapeutic Activity: 8-22 mins PT General Charges $$ ACUTE PT VISIT: 1 Visit         Dorothyann Maier, DPT, CLT  Acute Rehabilitation Services Office: [phone] (Secure chat preferred)   Dorothyann VEAR Maier 03/23/2024, 12:26 PM

## 2024-03-23 NOTE — Progress Notes (Signed)
 EVENING ROUNDS NOTE :     [address]       [place] [address]             [phone]                 6 Days Post-Op Procedure(s) (LRB): CORONARY ARTERY BYPASS GRAFTING TIMES THREE , USING LEFT INTERNAL MAMMARY ARTERY AND ENDOSCOPIC HARVESTED RIGHT SAPHENOUS VEIN (N/A) ECHOCARDIOGRAM, TRANSESOPHAGEAL, INTRAOPERATIVE (N/A) REPAIR, AORTIC DISSECTION, ASCENDING USING 30 MM HEMASHIELD PLATINUM VASCULAR GRAFT RESUSPENSION OF AORTIC VALVE   Total Length of Stay:  LOS: 13 days  Events:   Remains extubated    BP 119/63   Pulse (!) 109   Temp (!) 100.8 F (38.2 C)   Resp 20   Ht 6' 4 (1.93 m)   Wt 86 kg   SpO2 94%   BMI 23.08 kg/m   CVP:  [0 mmHg-14 mmHg] 2 mmHg  Vent Mode: PSV;CPAP FiO2 (%):  [40 %] 40 % Set Rate:  [16 bmp] 16 bmp Vt Set:  [620 mL] 620 mL PEEP:  [5 cmH20] 5 cmH20 Pressure Support:  [8 cmH20] 8 cmH20   albumin  human Stopped (03/18/24 0335)   famotidine  (PEPCID ) IV 100 mL/hr at 03/23/24 2200   feeding supplement (VITAL 1.5 CAL) 55 mL/hr at 03/23/24 2200   levofloxacin  (LEVAQUIN ) IV Stopped (03/23/24 1243)   milrinone  0.125 mcg/kg/min (03/23/24 2200)   norepinephrine  (LEVOPHED ) Adult infusion Stopped (03/19/24 1013)   propofol  (DIPRIVAN ) infusion Stopped (03/19/24 1039)   vancomycin      [START ON 03/24/2024] vancomycin       I/O last 3 completed shifts: In: 4712.9 [I.V.:145.4; Other:30; NG/GT:2960; IV Piggyback:1577.5] Out: 2306 [Urine:7025; Stool:80; Chest Tube:588]      Latest Ref Rng & Units 03/23/2024    4:28 AM 03/22/2024    4:20 AM 03/21/2024    4:02 AM  CBC  WBC 4.0 - 10.5 K/uL 9.6  9.7  6.9   Hemoglobin 13.0 - 17.0 g/dL 9.3  9.5  9.3   Hematocrit 39.0 - 52.0 % 29.6  29.1  28.5   Platelets 150 - 400 K/uL 55  50  41        Latest Ref Rng & Units 03/23/2024    4:28 AM 03/22/2024    4:20 AM 03/21/2024    4:02 AM  BMP  Glucose 70 - 99 mg/dL 863  876  854   BUN 8 - 23 mg/dL 45  49  52   Creatinine 0.61 - 1.24 mg/dL 9.13   9.17  9.09   Sodium 135 - 145 mmol/L 152  145  142   Potassium 3.5 - 5.1 mmol/L 3.6  3.7  3.6   Chloride 98 - 111 mmol/L 114  111  110   CO2 22 - 32 mmol/L 27  25  25    Calcium  8.9 - 10.3 mg/dL 7.5  7.8  7.8     ABG    Component Value Date/Time   PHART 7.409 03/17/2024 2158   PCO2ART 32.7 03/17/2024 2158   PO2ART 80 (L) 03/17/2024 2158   HCO3 20.7 03/17/2024 2158   TCO2 22 03/17/2024 2158   ACIDBASEDEF 3.0 (H) 03/17/2024 2158   O2SAT 52.3 03/23/2024 0428       Linnie Rayas, MD 03/23/2024 11:40 PM

## 2024-03-23 NOTE — Procedures (Signed)
 Extubation Procedure Note  Patient Details:   Name: JEURY MCNAB DOB: 25-Dec-1955 MRN: [id number]   Airway Documentation:  Vent end date: 03/23/24 Vent end time: 1011   Evaluation  O2 sats: stable throughout Complications: No apparent complications Patient did tolerate procedure well. Bilateral Breath Sounds: Diminished   Yes  Pt extubated to 4L [place] with RN at bedside. Positive cuff leak noted and VSS. RT will monitor.  Margarie CHRISTELLA Breen 03/23/2024, 10:11 AM

## 2024-03-24 ENCOUNTER — Inpatient Hospital Stay (HOSPITAL_COMMUNITY)

## 2024-03-24 ENCOUNTER — Encounter (HOSPITAL_COMMUNITY)

## 2024-03-24 DIAGNOSIS — J9601 Acute respiratory failure with hypoxia: Secondary | ICD-10-CM | POA: Diagnosis not present

## 2024-03-24 DIAGNOSIS — Z79899 Other long term (current) drug therapy: Secondary | ICD-10-CM

## 2024-03-24 DIAGNOSIS — T82898A Other specified complication of vascular prosthetic devices, implants and grafts, initial encounter: Secondary | ICD-10-CM | POA: Diagnosis not present

## 2024-03-24 DIAGNOSIS — J939 Pneumothorax, unspecified: Secondary | ICD-10-CM | POA: Diagnosis not present

## 2024-03-24 DIAGNOSIS — R609 Edema, unspecified: Secondary | ICD-10-CM

## 2024-03-24 DIAGNOSIS — I5023 Acute on chronic systolic (congestive) heart failure: Secondary | ICD-10-CM | POA: Diagnosis not present

## 2024-03-24 DIAGNOSIS — J14 Pneumonia due to Hemophilus influenzae: Secondary | ICD-10-CM

## 2024-03-24 DIAGNOSIS — M7989 Other specified soft tissue disorders: Secondary | ICD-10-CM | POA: Diagnosis not present

## 2024-03-24 DIAGNOSIS — I639 Cerebral infarction, unspecified: Secondary | ICD-10-CM | POA: Diagnosis not present

## 2024-03-24 DIAGNOSIS — J189 Pneumonia, unspecified organism: Secondary | ICD-10-CM | POA: Diagnosis not present

## 2024-03-24 DIAGNOSIS — I7100 Dissection of unspecified site of aorta: Secondary | ICD-10-CM

## 2024-03-24 DIAGNOSIS — Z951 Presence of aortocoronary bypass graft: Secondary | ICD-10-CM

## 2024-03-24 DIAGNOSIS — I214 Non-ST elevation (NSTEMI) myocardial infarction: Secondary | ICD-10-CM | POA: Diagnosis not present

## 2024-03-24 DIAGNOSIS — G9340 Encephalopathy, unspecified: Secondary | ICD-10-CM | POA: Diagnosis not present

## 2024-03-24 DIAGNOSIS — I770 Arteriovenous fistula, acquired: Secondary | ICD-10-CM

## 2024-03-24 DIAGNOSIS — Z9889 Other specified postprocedural states: Secondary | ICD-10-CM

## 2024-03-24 DIAGNOSIS — I5021 Acute systolic (congestive) heart failure: Secondary | ICD-10-CM | POA: Diagnosis not present

## 2024-03-24 DIAGNOSIS — G928 Other toxic encephalopathy: Secondary | ICD-10-CM

## 2024-03-24 DIAGNOSIS — R238 Other skin changes: Secondary | ICD-10-CM

## 2024-03-24 LAB — CG4 I-STAT (LACTIC ACID): Lactic Acid, Venous: 0.8 mmol/L (ref 0.5–1.9)

## 2024-03-24 LAB — POCT I-STAT 7, (LYTES, BLD GAS, ICA,H+H)
Acid-Base Excess: 7 mmol/L — ABNORMAL HIGH (ref 0.0–2.0)
Acid-Base Excess: 6 mmol/L — ABNORMAL HIGH (ref 0.0–2.0)
Acid-Base Excess: 4 mmol/L — ABNORMAL HIGH (ref 0.0–2.0)
Bicarbonate: 30.4 mmol/L — ABNORMAL HIGH (ref 20.0–28.0)
Bicarbonate: 30.4 mmol/L — ABNORMAL HIGH (ref 20.0–28.0)
Bicarbonate: 29.8 mmol/L — ABNORMAL HIGH (ref 20.0–28.0)
Calcium, Ion: 1.08 mmol/L — ABNORMAL LOW (ref 1.15–1.40)
Calcium, Ion: 1.12 mmol/L — ABNORMAL LOW (ref 1.15–1.40)
Calcium, Ion: 1.08 mmol/L — ABNORMAL LOW (ref 1.15–1.40)
HCT: 26 % — ABNORMAL LOW (ref 39.0–52.0)
HCT: 28 % — ABNORMAL LOW (ref 39.0–52.0)
HCT: 26 % — ABNORMAL LOW (ref 39.0–52.0)
Hemoglobin: 8.8 g/dL — ABNORMAL LOW (ref 13.0–17.0)
Hemoglobin: 9.5 g/dL — ABNORMAL LOW (ref 13.0–17.0)
Hemoglobin: 8.8 g/dL — ABNORMAL LOW (ref 13.0–17.0)
O2 Saturation: 95 %
O2 Saturation: 93 %
O2 Saturation: 100 %
Patient temperature: 38.4
Patient temperature: 38.6
Patient temperature: 38.4
Potassium: 3.5 mmol/L (ref 3.5–5.1)
Potassium: 3.9 mmol/L (ref 3.5–5.1)
Potassium: 4 mmol/L (ref 3.5–5.1)
Sodium: 149 mmol/L — ABNORMAL HIGH (ref 135–145)
Sodium: 150 mmol/L — ABNORMAL HIGH (ref 135–145)
Sodium: 148 mmol/L — ABNORMAL HIGH (ref 135–145)
TCO2: 32 mmol/L (ref 22–32)
TCO2: 32 mmol/L (ref 22–32)
TCO2: 31 mmol/L (ref 22–32)
pCO2 arterial: 39.8 mmHg (ref 32–48)
pCO2 arterial: 45.9 mmHg (ref 32–48)
pCO2 arterial: 51.5 mmHg — ABNORMAL HIGH (ref 32–48)
pH, Arterial: 7.496 — ABNORMAL HIGH (ref 7.35–7.45)
pH, Arterial: 7.436 (ref 7.35–7.45)
pH, Arterial: 7.376 (ref 7.35–7.45)
pO2, Arterial: 74 mmHg — ABNORMAL LOW (ref 83–108)
pO2, Arterial: 70 mmHg — ABNORMAL LOW (ref 83–108)
pO2, Arterial: 231 mmHg — ABNORMAL HIGH (ref 83–108)

## 2024-03-24 LAB — GLUCOSE, CAPILLARY
Glucose-Capillary: 109 mg/dL — ABNORMAL HIGH (ref 70–99)
Glucose-Capillary: 128 mg/dL — ABNORMAL HIGH (ref 70–99)
Glucose-Capillary: 183 mg/dL — ABNORMAL HIGH (ref 70–99)
Glucose-Capillary: 127 mg/dL — ABNORMAL HIGH (ref 70–99)
Glucose-Capillary: 136 mg/dL — ABNORMAL HIGH (ref 70–99)
Glucose-Capillary: 134 mg/dL — ABNORMAL HIGH (ref 70–99)

## 2024-03-24 LAB — CBC
HCT: 30.2 % — ABNORMAL LOW (ref 39.0–52.0)
Hemoglobin: 9.4 g/dL — ABNORMAL LOW (ref 13.0–17.0)
MCH: 30 pg (ref 26.0–34.0)
MCHC: 31.1 g/dL (ref 30.0–36.0)
MCV: 96.5 fL (ref 80.0–100.0)
Platelets: 88 K/uL — ABNORMAL LOW (ref 150–400)
RBC: 3.13 MIL/uL — ABNORMAL LOW (ref 4.22–5.81)
RDW: 17 % — ABNORMAL HIGH (ref 11.5–15.5)
WBC: 13.1 K/uL — ABNORMAL HIGH (ref 4.0–10.5)
nRBC: 0.6 % — ABNORMAL HIGH (ref 0.0–0.2)

## 2024-03-24 LAB — BASIC METABOLIC PANEL WITH GFR
Anion gap: 11 (ref 5–15)
BUN: 44 mg/dL — ABNORMAL HIGH (ref 8–23)
CO2: 28 mmol/L (ref 22–32)
Calcium: 7.5 mg/dL — ABNORMAL LOW (ref 8.9–10.3)
Chloride: 109 mmol/L (ref 98–111)
Creatinine, Ser: 0.79 mg/dL (ref 0.61–1.24)
GFR, Estimated: >60 mL/min (ref >60)
Glucose, Bld: 120 mg/dL — ABNORMAL HIGH (ref 70–99)
Potassium: 3.5 mmol/L (ref 3.5–5.1)
Sodium: 148 mmol/L — ABNORMAL HIGH (ref 135–145)

## 2024-03-24 LAB — COOXEMETRY PANEL
Carboxyhemoglobin: 1.8 % — ABNORMAL HIGH (ref 0.5–1.5)
Carboxyhemoglobin: 1.9 % — ABNORMAL HIGH (ref 0.5–1.5)
Carboxyhemoglobin: 1.5 % (ref 0.5–1.5)
Carboxyhemoglobin: 1.9 % — ABNORMAL HIGH (ref 0.5–1.5)
Methemoglobin: 0.7 % (ref 0.0–1.5)
Methemoglobin: <0.7 % (ref 0.0–1.5)
Methemoglobin: <0.7 % (ref 0.0–1.5)
Methemoglobin: 0.8 % (ref 0.0–1.5)
O2 Saturation: 68.6 %
O2 Saturation: 96.4 %
O2 Saturation: 69.9 %
O2 Saturation: 75.2 %
Total hemoglobin: 10 g/dL — ABNORMAL LOW (ref 12.0–16.0)
Total hemoglobin: 9.1 g/dL — ABNORMAL LOW (ref 12.0–16.0)
Total hemoglobin: 9.2 g/dL — ABNORMAL LOW (ref 12.0–16.0)
Total hemoglobin: 8.2 g/dL — ABNORMAL LOW (ref 12.0–16.0)

## 2024-03-24 LAB — LACTIC ACID, PLASMA: Lactic Acid, Venous: 1.5 mmol/L (ref 0.5–1.9)

## 2024-03-24 LAB — MAGNESIUM: Magnesium: 2.4 mg/dL (ref 1.7–2.4)

## 2024-03-24 LAB — PROCALCITONIN: Procalcitonin: 0.3 ng/mL

## 2024-03-24 MED ORDER — ORAL CARE MOUTH RINSE
15.0000 mL | OROMUCOSAL | Status: DC | PRN
Start: 2024-03-24 — End: 2024-03-24

## 2024-03-24 MED ORDER — CALCIUM GLUCONATE-NACL 2-0.675 GM/100ML-% IV SOLN
2.0000 g | Freq: Once | INTRAVENOUS | Status: AC
  Administered 2024-03-24 (×2): 2000 mg via INTRAVENOUS
  Filled 2024-03-24: qty 100

## 2024-03-24 MED ORDER — DOCUSATE SODIUM 50 MG/5ML PO LIQD
100.0000 mg | Freq: Two times a day (BID) | ORAL | Status: DC
  Administered 2024-03-24 – 2024-03-26 (×12): 100 mg
  Filled 2024-03-24 (×6): qty 10

## 2024-03-24 MED ORDER — AMIODARONE HCL 200 MG PO TABS
200.0000 mg | ORAL_TABLET | Freq: Every day | ORAL | Status: DC
  Administered 2024-03-24 – 2024-03-26 (×6): 200 mg
  Filled 2024-03-24 (×3): qty 1

## 2024-03-24 MED ORDER — VITAL 1.5 CAL PO LIQD
1000.0000 mL | ORAL | Status: DC
  Administered 2024-03-24 – 2024-03-31 (×14): 1000 mL
  Filled 2024-03-24: qty 1000

## 2024-03-24 MED ORDER — FENTANYL BOLUS VIA INFUSION
25.0000 ug | INTRAVENOUS | Status: DC | PRN
  Administered 2024-03-24 – 2024-03-26 (×8): 100 ug via INTRAVENOUS
  Administered 2024-03-27: 70 ug via INTRAVENOUS
  Administered 2024-03-27 (×5): 50 ug via INTRAVENOUS

## 2024-03-24 MED ORDER — ETOMIDATE 2 MG/ML IV SOLN
INTRAVENOUS | Status: AC
  Filled 2024-03-24: qty 20

## 2024-03-24 MED ORDER — MIDAZOLAM HCL 2 MG/2ML IJ SOLN
INTRAMUSCULAR | Status: AC
  Filled 2024-03-24: qty 2

## 2024-03-24 MED ORDER — ORAL CARE MOUTH RINSE: 15.0000 mL | OROMUCOSAL | Status: DC | PRN

## 2024-03-24 MED ORDER — ACETAMINOPHEN 325 MG PO TABS
650.0000 mg | ORAL_TABLET | Freq: Four times a day (QID) | ORAL | Status: DC
  Administered 2024-03-24 – 2024-03-27 (×23): 650 mg
  Filled 2024-03-24 (×13): qty 2

## 2024-03-24 MED ORDER — KETAMINE HCL 50 MG/5ML IJ SOSY
PREFILLED_SYRINGE | INTRAMUSCULAR | Status: AC
  Filled 2024-03-24: qty 10

## 2024-03-24 MED ORDER — PROSOURCE TF20 ENFIT COMPATIBL EN LIQD
60.0000 mL | Freq: Three times a day (TID) | ENTERAL | Status: DC
  Administered 2024-03-24 – 2024-04-01 (×31): 60 mL
  Filled 2024-03-24 (×24): qty 60

## 2024-03-24 MED ORDER — FENTANYL CITRATE PF 50 MCG/ML IJ SOSY
PREFILLED_SYRINGE | INTRAMUSCULAR | Status: AC
  Administered 2024-03-24 (×2): 50 ug via INTRAVENOUS
  Filled 2024-03-24: qty 2

## 2024-03-24 MED ORDER — MILRINONE LACTATE IN DEXTROSE 20-5 MG/100ML-% IV SOLN
INTRAVENOUS | Status: AC
  Filled 2024-03-24: qty 100

## 2024-03-24 MED ORDER — FENTANYL CITRATE PF 50 MCG/ML IJ SOSY: 25.0000 ug | PREFILLED_SYRINGE | Freq: Once | INTRAMUSCULAR | Status: AC

## 2024-03-24 MED ORDER — FUROSEMIDE 10 MG/ML IJ SOLN
80.0000 mg | Freq: Once | INTRAMUSCULAR | Status: AC
  Administered 2024-03-24 (×2): 80 mg via INTRAVENOUS
  Filled 2024-03-24: qty 8

## 2024-03-24 MED ORDER — ALBUMIN HUMAN 25 % IV SOLN
25.0000 g | Freq: Four times a day (QID) | INTRAVENOUS | Status: AC
  Administered 2024-03-24 – 2024-03-25 (×8): 25 g via INTRAVENOUS
  Filled 2024-03-24 (×4): qty 100

## 2024-03-24 MED ORDER — FENTANYL 2500MCG IN NS 250ML (10MCG/ML) PREMIX INFUSION
0.0000 ug/h | INTRAVENOUS | Status: DC
  Administered 2024-03-24 – 2024-03-27 (×5): 50 ug/h via INTRAVENOUS
  Filled 2024-03-24 (×4): qty 250

## 2024-03-24 MED ORDER — POTASSIUM CHLORIDE 20 MEQ PO PACK
40.0000 meq | PACK | Freq: Once | ORAL | Status: AC
  Administered 2024-03-24 (×2): 40 meq
  Filled 2024-03-24: qty 2

## 2024-03-24 MED ORDER — NOREPINEPHRINE 4 MG/250ML-% IV SOLN
0.0000 ug/min | INTRAVENOUS | Status: DC
  Administered 2024-03-24 (×2): 8 ug/min via INTRAVENOUS
  Administered 2024-03-24 (×4): 2 ug/min via INTRAVENOUS
  Filled 2024-03-24 (×2): qty 250

## 2024-03-24 MED ORDER — POLYETHYLENE GLYCOL 3350 17 G PO PACK
17.0000 g | PACK | Freq: Every day | ORAL | Status: DC
  Administered 2024-03-24 – 2024-03-26 (×6): 17 g
  Filled 2024-03-24 (×3): qty 1

## 2024-03-24 MED ORDER — ORAL CARE MOUTH RINSE
15.0000 mL | OROMUCOSAL | Status: DC
  Administered 2024-03-24 – 2024-04-01 (×119): 15 mL via OROMUCOSAL

## 2024-03-24 MED ORDER — ORAL CARE MOUTH RINSE: 15.0000 mL | OROMUCOSAL | Status: DC

## 2024-03-24 MED ORDER — SODIUM CHLORIDE 0.9 % IV SOLN
2.0000 g | Freq: Three times a day (TID) | INTRAVENOUS | Status: DC
  Administered 2024-03-24 – 2024-03-27 (×19): 2 g via INTRAVENOUS
  Filled 2024-03-24 (×12): qty 10

## 2024-03-24 MED ORDER — SUCCINYLCHOLINE CHLORIDE 200 MG/10ML IV SOSY
PREFILLED_SYRINGE | INTRAVENOUS | Status: AC
  Filled 2024-03-24: qty 10

## 2024-03-24 MED ORDER — ROCURONIUM BROMIDE 10 MG/ML (PF) SYRINGE
PREFILLED_SYRINGE | INTRAVENOUS | Status: AC
  Filled 2024-03-24: qty 10

## 2024-03-24 MED ORDER — NOREPINEPHRINE 4 MG/250ML-% IV SOLN
INTRAVENOUS | Status: AC
  Filled 2024-03-24: qty 250

## 2024-03-24 MED ORDER — PROPOFOL 1000 MG/100ML IV EMUL
0.0000 ug/kg/min | INTRAVENOUS | Status: DC
  Administered 2024-03-24 – 2024-03-25 (×4): 10 ug/kg/min via INTRAVENOUS
  Administered 2024-03-25 (×2): 5 ug/kg/min via INTRAVENOUS
  Administered 2024-03-26: 20 ug/kg/min via INTRAVENOUS
  Administered 2024-03-26 (×2): 10 ug/kg/min via INTRAVENOUS
  Administered 2024-03-26: 20 ug/kg/min via INTRAVENOUS
  Filled 2024-03-24 (×5): qty 100

## 2024-03-24 NOTE — Progress Notes (Signed)
 Nutrition Follow-up  DOCUMENTATION CODES:   Non-severe (moderate) malnutrition in context of chronic illness (decreased appetite related to worsening heart issues)  INTERVENTION:   Tube Feeding via Cortrak:  Increase Vital 1.5 to 60 ml/hr Increase Pro-Source TF20 60 mL to TID TF at goal provides 2400 kcals, 157 g of protein and 1094 mL of free water   Small amount of additional calories provided via lipid in propofol  infusion  Continue free water , current flush of 200 mL q 4  hours-order per MD; provides additional 1200 mL in 24 hours  Recommend that pt discuss all outpatient vitamin/mineral supplement use, including use of Relief Factor and Xango Juice,  with MD prior to resuming use as outpatient.   NUTRITION DIAGNOSIS:   Moderate Malnutrition related to chronic illness (decreased appetite related to worsening heart issues) as evidenced by mild fat depletion, mild muscle depletion, energy intake < 75% for > or equal to 1 month.  Continues but being addressed via TF   GOAL:   Patient will meet greater than or equal to 90% of their needs  Met via TF  MONITOR:   TF tolerance, Vent status, Weight trends  REASON FOR ASSESSMENT:   Consult Enteral/tube feeding initiation and management  ASSESSMENT:   68 yo male admitted with NSTEMI. PMH includes metastatic lung cancer (in remission), CAD, STEMI 2004, MVP, MVR, prior stents and mini - mitral valve repair (2015), HLD, HTN, GERD.  7/28 Admitted, Cath Lab, ECHO EF 30-35 %, RV mildly reduced 8/04 OR: CABG x 3, repair of ascending aortic dissection (intra-op discovery) with vascular graft and repair of aortic valve 8/05 Myoclonus, CT/CTA head normal, EEG started 8/07 Post-op Echo with EF improving, 40-45% with septal dyssynchrony 8/08 Cortrak placed 8/10 Extubated, very weak/decompensated 8/11 Re-intubated, Vascular surgery consulted for duskiness in LLE  Extubated yesterday but required re-intubation this AM, Bronch-R  Mainstem occluded by mucous plug-suctioned. Febrile Levophed  at 2, Propofol  and fentanyl  for sedation  TF held for re-intubation, previously tolerating Vital 1.5 at 55 ml/hr with Pro-source TF20 60 mL BID Free water  flush of 200 mL q 4 hours ordered yesterday  Finger tips dusky this AM, toes also dusky with mottling noted in BLE  Current Wt 73.7 kg, wt yesterday 86 kg. Question accuracy of weights. Noted UOP 5.6 L in 24 hours, net negative 2.7 L in 24 hours despite remaining net positive for the admission. Based on I/O would have expected a 2-3 kg wt loss in 24 hours  Noted pt was drinking large quantities of Xango Juice (Mangosteen main ingredient) prior to admission; drinking 32 ounces, considered high dose (max dose <18 ounces daily).  Of note, mangosteen supplements have potential to increase  risk of bleeding  Per home med list, pt also take high dose vitamin C  (2000 mg per day), Magnesium  30 mg daily, CoQ10 supplement, Pro-state MVI and a supplement called Relief Factor.  Relief Factor contains turmeric, Omega 3s, Resveratrol, Icariin, Japanese [place], Vit D, Magnesium  and Zinc.   Noted scabbed over wounds to left side below chest, per RN these were related to pads.   Chest tube x 3 (L pleural, posterior mediastinal, R anterior) with 168 mL in 24 hours  Labs: Sodium 148 (H) Potassium 3.5 (wdl) Magnesium  2.4 (wdl) Platelets 88 (L) BUN 44 (H) Creatinine 0.79 CBGs 95-126 (goal 140-180)  Meds Dulcolax SS novolog  Semglee  15 units daily Miralax    Diet Order:   Diet Order     None       EDUCATION NEEDS:  Not appropriate for education at this time  Skin:  Skin Assessment: Skin Integrity Issues: Skin Integrity Issues:: Stage I, Incisions, Other (Comment) Stage I: R heel Incisions: surgical incisions to chest, R leg, L groin Other: R wrist puncture site for cardiac cath  Last BM:  8/2  Height:   Ht Readings from Last 1 Encounters:  03/21/24 6' 4 (1.93  m)    Weight:   Wt Readings from Last 1 Encounters:  03/24/24 73.7 kg    Ideal Body Weight:  91.8 kg  BMI:  Body mass index is 19.78 kg/m.  Estimated Nutritional Needs:   Kcal:  2300-2600 kcals  Protein:  140-170 g  Fluid:  2L   Betsey Finger MS, RDN, LDN, CNSC Registered Dietitian 3 Clinical Nutrition RD Inpatient Contact Info in Amion

## 2024-03-24 NOTE — Progress Notes (Signed)
 Advanced Heart Failure Rounding Note  Cardiologist: [name], MD   Chief Complaint: Post-op CABG and Bentall procedure   Subjective:    POD #7  CO-OX  69% on Milrinone  0.125 mcg.   Increased WOB this morning.    Objective:   Weight Range: 73.7 kg Body mass index is 19.78 kg/m.   Vital Signs:   Temp:  [99.9 F (37.7 C)-101.1 F (38.4 C)] 101.1 F (38.4 C) (08/11 0400) Pulse Rate:  [96-117] 113 (08/11 0630) Resp:  [18-31] 28 (08/11 0630) BP: (92-149)/(47-75) 136/58 (08/11 0630) SpO2:  [89 %-100 %] 91 % (08/11 0630) Arterial Line BP: (85-189)/(34-79) 165/52 (08/11 0400) FiO2 (%):  [40 %] 40 % (08/10 0800) Weight:  [73.7 kg] 73.7 kg (08/11 0500) Last BM Date : 03/23/24  Weight change: Filed Weights   03/22/24 0500 03/23/24 0500 03/24/24 0500  Weight: 85.7 kg 86 kg 73.7 kg    Intake/Output:   Intake/Output Summary (Last 24 hours) at 03/24/2024 0649 Last data filed at 03/24/2024 0400 Gross per 24 hour  Intake 2665.74 ml  Output 5488 ml  Net -2822.26 ml      Physical Exam   General:  Appears weak . Increase WOB + Cortrak  Neck: no JVD.  Cor: Regular rate & rhythm. Sternal  Lungs: Rhonchi throughout. Using accessory muscles Abdomen: soft, nontender, nondistended.  Extremities: R and LLE  R groin thrill old cannulation site. RLE ecchymotic Neuro: Opens eyes.   Telemetry   ST   Labs    CBC Recent Labs    03/23/24 0428 03/24/24 0431  WBC 9.6 13.1*  HGB 9.3* 9.4*  HCT 29.6* 30.2*  MCV 97.7 96.5  PLT 55* 88*   Basic Metabolic Panel Recent Labs    91/90/74 0420 03/23/24 0428 03/24/24 0431  NA 145 152* 148*  K 3.7 3.6 3.5  CL 111 114* 109  CO2 25 27 28   GLUCOSE 123* 136* 120*  BUN 49* 45* 44*  CREATININE 0.82 0.86 0.79  CALCIUM  7.8* 7.5* 7.5*  MG 2.6*  --  2.4     Medications:     Scheduled Medications:  sodium chloride    Intravenous Once   sodium chloride    Intravenous Once   acetaminophen   650 mg Oral Q6H    amiodarone   200 mg Oral Daily   aspirin  EC  325 mg Oral Daily   Or   aspirin   324 mg Per Tube Daily   atorvastatin   80 mg Per Tube Daily   bisacodyl   10 mg Oral Daily   Or   bisacodyl   10 mg Rectal Daily   Chlorhexidine  Gluconate Cloth  6 each Topical Daily   docusate  100 mg Per Tube BID   feeding supplement (PROSource TF20)  60 mL Per Tube BID   free water   200 mL Per Tube Q4H   insulin  aspart  0-24 Units Subcutaneous Q4H   insulin  glargine-yfgn  15 Units Subcutaneous Daily   levETIRAcetam   1,000 mg Intravenous Q12H   mouth rinse  15 mL Mouth Rinse 4 times per day   polyethylene glycol  17 g Per Tube Daily   sodium chloride  flush  10-40 mL Intracatheter Q12H   sodium chloride  flush  3 mL Intravenous Q12H   sodium chloride  HYPERTONIC  4 mL Nebulization Q4H    Infusions:  albumin  human Stopped (03/18/24 0335)   famotidine  (PEPCID ) IV Stopped (03/23/24 2229)   feeding supplement (VITAL 1.5 CAL) 55 mL/hr at 03/23/24 2300   levofloxacin  (  LEVAQUIN ) IV Stopped (03/23/24 1243)   milrinone  0.125 mcg/kg/min (03/23/24 2300)   norepinephrine  (LEVOPHED ) Adult infusion Stopped (03/19/24 1013)   propofol  (DIPRIVAN ) infusion Stopped (03/19/24 1039)   vancomycin       PRN Medications: albumin  human, hydrALAZINE , ipratropium-albuterol , midazolam , ondansetron  (ZOFRAN ) IV, mouth rinse, oxyCODONE , sodium chloride  flush    Patient Profile  68 y.o. male with history of CAD, severe MR s/p minimally invasive mitral valve repair, stage III NSCLC.   Patient admitted with NSTEMI and new systolic CHF. Underwent 3v CABG X 3 c/b ascending aortic dissection.  Assessment/Plan   NSTEMI with known CAD -Remote anterior STEMI with prior stents to LAD -NSTEMI - 3 V CAD on cath with 99% in-stent restenosis in LAD, 50% residual stenosis post PTCA -CABG X 3 (LIMA to LAD, SVG to OM, SBVG to PDA) 03/17/24 as above -Aspirin  + statin.  - No s/s angina  Ascending Aortic Dissection  - complication of CABG -  s/p Bentall and resuspenison of AoV    Acute systolic CHF/ICM -Newly reduced EF -Echo 03/10/24: EF 30-35%, anterior WMA, RV mildly reduced -Post CABG limited echo 8/7: EF improving, 40-45% w/ septal dyssynchrony  -CO-OX stable on milrinone . Stop milrinone  for now. CVP is not elevated.  Acute hypoxic resp failure/ Suspected PNA  - Tracheal aspirate with parainfluenzae + GPCs - Increased WOB/accessory muscles-->Re intubated this am.   -On vancomycin  and levaquin  (PCN allergy)  Fever Concern about sepsis. Remove art line. Obtain blood cultures.  ? Lower extremity source ? PNA  Severe MR s/p minimally invasive mitral valve repair -trivial MR with mean gradient of 4 mmhg on echo this admit    Postop anemia/thrombocytopenia -Multiple blood products in OR and immediately post-op -Hgb improved and remains stable at 9.3 -Platelets improving 88K -Follow closely -Has not been on heparin  post-op. Holding DVT prophylaxis.  NSVT -No recurrence -On po amio daily , may need to restart amio drip.    NSCLC w/bone mets -S/p chemoradiation and immunotherapy -Stable disease on most recent outpatinet imaging. Followed by Heme/Onc -Possible new sclerotic lesion/small LUL nodule on CXR this admission. Will eventually require CT.   Myoclonic seizures - Resolved. Suspect hypoxic/anoxic brain injury - Neurology following - Off sedation and following commands.  - Keppra  - MRI brain with multiple small infarcts   Left pneumothorax - per CT surgery keep left chest tube    Lower Extremities Mottling VVS consulted. Unable to obtain pulses on R. R foot cold.  US  R groin. Able to doppler L pedal pulse.     CRITICAL CARE Performed by: Greig Mosses NP-C    Total critical care time:  20 minutes  Critical care time was exclusive of separately billable procedures and treating other patients.  Critical care was necessary to treat or prevent imminent or life-threatening deterioration.  Critical  care was time spent personally by me on the following activities: development of treatment plan with patient and/or surrogate as well as nursing, discussions with consultants, evaluation of patient's response to treatment, examination of patient, obtaining history from patient or surrogate, ordering and performing treatments and interventions, ordering and review of laboratory studies, ordering and review of radiographic studies, pulse oximetry and re-evaluation of patient's condition.

## 2024-03-24 NOTE — Progress Notes (Signed)
 Right groin duplex and lower ext venous studies completed. Ricka Elder Molt, RDMS, RVT

## 2024-03-24 NOTE — Plan of Care (Signed)
   Problem: Clinical Measurements: Goal: Diagnostic test results will improve Outcome: Progressing   Problem: Nutrition: Goal: Adequate nutrition will be maintained Outcome: Progressing

## 2024-03-24 NOTE — Procedures (Addendum)
 Patient began desatting to 64s on NRB+Franklin, less responsive, called wife and let her know we needed to move up timeline.   Intubation Procedure Note  Alan [name]  [id number]  12/21/1955  Date:03/24/24  Time:9:19 AM   Provider Performing:Jonathandavid Marlett C Claudene    Procedure: Intubation (31500)  Indication(s) Respiratory Failure  Consent FYI over phone, emergent   Anesthesia Etomidate , Versed , and Fentanyl    Time Out Verified patient identification, verified procedure, site/side was marked, verified correct patient position, special equipment/implants available, medications/allergies/relevant history reviewed, required imaging and test results available.   Sterile Technique Usual hand hygeine, masks, and gloves were used   Procedure Description Patient positioned in bed supine.  Sedation given as noted above.  Patient was intubated with endotracheal tube using Glidescope.  View was Grade 1 full glottis .  Number of attempts was 1.  Colorimetric CO2 detector was consistent with tracheal placement.   Complications/Tolerance Copious whitish secretions in and around cords c/w aspiration Chest X-ray is ordered to verify placement.   EBL Minimal   Specimen(s) None

## 2024-03-24 NOTE — Progress Notes (Signed)
 NAME:  KHOI HAMBERGER, MRN:  [id number], DOB:  1955/11/19, LOS: 14 ADMISSION DATE:  03/10/2024, CONSULTATION DATE:  8/4 REFERRING MD:  hendrickson, CHIEF COMPLAINT:  post-op cardiac surgery    History of Present Illness:  68 year old male w/ h/o HTN, CAD, prior DES x 3 to LAD, minimally invasive MVR (2015), SVT s/p ablation,  metastatic NSCLC w/ mets to bone (in remission).  Admitted 7/28 w/ chest pain. Initially started 7/26 went to ER and CEs and EKG neg so went home but CP persisted so returned.  In ER on 28th + trop I, new T wave inversion in anterior lateral leads.  Treated w/ supplemental oxygen, morphine , started on heparin  gtt.  Went to cath lab  Findings:  99% -80% in-stent restenosis of LAD balloon angioplasty completed w/ partial restoration of flow (this re-stenosed stent was felt culprit lesion). RCA 45 to 70% stenosed, cx was 40%. Had nml LVEDP, no AS, referred to CVTS for CABG given 3V disease. and placed on plavix . EF from ECHO 30-35% w/ left regional wall motion abnormality.   Went to OR 8/4 for planned CABG x 3, as well as repair of ascending aortic dissection w/ vascular graft and repair of aortic valve  Time on bypass: ~ 4h42min EBL 1390 Cell saver 910 Received: FFP X2, PLTs 1 unit PRBC and cryo for on going surgical oozing Arrived in ICU w 480 ml bloody outpt from Chest tube Surgical team at bedside on arrival  Shortly after arrival to ICU had runs of VT and AF w/ RVR. Amiodarone  bolus and gtt initiated.  Got 2 gms calcium  another 2 units PLTs. TXA  Pertinent  Medical History  Prior CAD w/ DES X 2 and prior minimally invasive MVR 2015, NSCLC q/ mets to bone (2023) s/p chemo and XRT w/ most recent scans showing stable non-active disease  NSTEMI, HLD Significant Hospital Events: Including procedures, antibiotic start and stop dates in addition to other pertinent events   7/28 admitted w/ CP and acute inf/lat MI, cardiac cath svr 3V disease w/ re-stenosed LAD stent and  incomplete restoration of coronary flow. EF 30-35% referred for CABG 8/4 to OR. CABG x 3 v but as completing CABG noted blood loss and intra-op ECHO showed dissection of Aorta. So underwent AV graft placement and AVR. Large EBL in OR received several blood products. Immediate post op w/ large vol CT output, runs of VT and SVT. Got 2gms calcium , amiodarone  and placed on overdrive pacing via pacer critical care at bedside assisting w/ support  8/5 myoclonus; normal head CT& CTA, EEG started. Loaded with keppra .  8/6 remained on EEG, weaned off propofol  8/7 woke up, followed commands 8/8 started pneumonia antibiotics 8/9 failed SBT; milrinone  discontinued  Interim History / Subjective:  Continued high WOB Wet cough O2 needs higher Trouble with dopplers left leg >> right  Objective    Blood pressure (!) 136/58, pulse (!) 113, temperature (!) 101.1 F (38.4 C), temperature source Bladder, resp. rate (!) 28, height 6' 4 (1.93 m), weight 73.7 kg, SpO2 91%. CVP:  [0 mmHg-14 mmHg] 6 mmHg  Vent Mode: PSV;CPAP FiO2 (%):  [40 %] 40 % PEEP:  [5 cmH20] 5 cmH20 Pressure Support:  [8 cmH20] 8 cmH20   Intake/Output Summary (Last 24 hours) at 03/24/2024 0733 Last data filed at 03/24/2024 0649 Gross per 24 hour  Intake 3073.31 ml  Output 5813 ml  Net -2739.69 ml   Filed Weights   03/22/24 0500 03/23/24 0500 03/24/24  0500  Weight: 85.7 kg 86 kg 73.7 kg    Examination: Ill appearing + accessory muscle use Moves x 4 to command but weak Wet cough Heart tachy No air leak L chest tube Sternotomy looks okay Per AHF thrill R groin at old arterial cannulation site L leg paler than R, cannot feel DP or PT on either side  Coox remains fine Cr okay CXR does not look bad Plts better   Resolved problem list  Post operative acute blood loss w/ consumptive coagulopathy & thrombocytopenia following prolonged CPB time  right hemothorax> resolved Assessment and Plan   3V CAD and restenosis of DES  now s/p CABG x 3 V w/ intraoperative finding of ascending aorta dissection requiring aortic graft and AVR Acute on chronic HFrEF due to ICM with cardiogenic shock. H/o HLD, HLD and CAD w/ severe mitral valve disease. Prior stents and mini- mitral valve repair (2015) for severe MR. 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 Last PET w/ no active disease- in remission since 2023. Possible new sclerotic lesion on CXR with small LUL nodule. Ongoing hypoxemic respiratory failure, H influenzae pneumonia, high WOB due to weak cough, ongoing high metabolic demand, fevers, aspiration Worsening clinical exam BL LE and R groin (mottling worse on left with difficult to find pulses)- VVS eval'd 8/11, nonurgent R groin US ; L DP is more apical than current marked dot Hx PCN allergy Reactive/ABLA associated thrombocytopenia- improved Hyperglycemia- on basal bolus, A1c preop was only 5  Wife coming in to chat, WOB/O2 needs continue to increase and likely recurrent aspiration driving fevers: aspiration related to neurological injury: intubate, sedate, vent bundle; would not be surprised if needs trach and LTACH depending on GOC   Arterial duplex; appreciate VVS input  GDMT, inotropes, diuresis per AHF  Current abx vanc, aztreonam ; check Pct; could be neuro storm  Adjust insulin  for CBG 100-180  Hopefully start DVT ppx tomorrow? Check LE duplex given ongoing fevers, femoral vein instrumentation  Chest tube remains in place, could consider anterior one to open up lung more  CT chest dedicated for ?sclerotic lesion and nodule; not really sure it's there  Best Practice (right click and Reselect all SmartList  Selections daily)   Diet/type: tubefeeds DVT prophylaxis SCD Pressure ulcer(s): N/A GI prophylaxis: PPI Lines: Central line, Arterial Line, and yes and it is still needed Foley:  Yes, and it is still needed Code Status:  full code Last date of multidisciplinary goals of care discussion [wife updated and coming in ]  95 min cc time Rolan Sharps MD PCCM

## 2024-03-24 NOTE — TOC Progression Note (Signed)
 Transition of Care [facility]) - Progression Note    Patient Details  Name: Alan [name] MRN: [id number] Date of Birth: [birth year]  Transition of Care [facility] CM/SW Contact  Justina Delcia Czar, RN Phone Number: [phone] 03/24/2024, 2:08 PM  Clinical Narrative:    Patient remains on vent, HCAP, and IV abx.  Chart reviewed for discharge readiness, patient not medically stable for d/c. Inpatient CM/CSW will continue to monitor pt's advancement through interdisciplinary progression rounds. If new pt transition needs arise, MD please place a TOC consult.    Expected Discharge Plan: Home w Home Health Services Barriers to Discharge: Continued Medical Work up    Expected Discharge Plan and Services In-house Referral: NA Discharge Planning Services: CM Consult Post Acute Care Choice: Home Health Living arrangements for the past 2 months: Single Family Home                   DME Agency: NA                   Social Drivers of Health (SDOH) Interventions SDOH Screenings   Food Insecurity: No Food Insecurity (03/11/2024)  Housing: Low Risk  (03/11/2024)  Transportation Needs: No Transportation Needs (03/11/2024)  Utilities: Not At Risk (03/11/2024)  Depression (PHQ2-9): Low Risk  (10/05/2022)  Social Connections: Socially Integrated (03/10/2024)  Tobacco Use: Low Risk  (03/17/2024)    Readmission Risk Interventions     No data to display

## 2024-03-24 NOTE — Consult Note (Signed)
 Hospital Consult    Reason for Consult: Concern for left lower extremity ischemia Requesting Physician: Cardiac ICU MRN #:  [id number]  History of Present Illness: This is a 68 y.o. male postop day 6 CABG x 3 using left internal mammary artery, right saphenous vein complicated by dissection, repair using 30 mm Hemashield platinum with resuspension of the aortic valve.  Patient currently in the cardiac ICU, extubated but with labored breathing.  Vascular surgery called due to some duskiness on the left foot, concern for ischemia. Right common vein venous cannulation, left sided arterial cannulation with cutdown and primary repair.   Preop ABI triphasic, normal.  Currently not on pressor.  Past Medical History:  Diagnosis Date   Adenocarcinoma of right lung, stage 3 (HCC) dx'd 03/2019   CAD S/P percutaneous coronary angioplasty 2004   (Ant STEMI) - Prox LAD 100% => 2 overlapping 3.5 x 1.8 mm Cypher DES stents.;  Patent as of August 2015   Dyslipidemia, goal LDL below 70    Essential hypertension    GERD (gastroesophageal reflux disease)    History of Mitral valve prolapse    Moderate - with moderate MR, noted February t 2013   History of radiation therapy    Right Pelvis & left humerus- 12/05/21-12/16/21- Dr. Lynwood Nasuti   History of Severe mitral regurgitation by prior echocardiogram 02/06/2014   TEE: Severe mitral regurgitation with a flail P2 segment and ruptured; Normal LV size & function, dilated LA.   Incidental lung nodule, greater than or equal to 8mm 03/16/2014   Ground glass opacity RML noted on CT scan   PONV (postoperative nausea and vomiting)    S/P Minimally Invasive MVR (mitral valve repair) 04/08/2014   Complex valvuloplasty including quadrangular resection of flail segment of posterior leaflet, sliding leaflet plasty, chordal transfer x1, Gore-tex neocord placement x4 and 34 mm Sorin Memo 3D rechord ring annuloplasty via right mini thoracotomy approach   ST elevation  myocardial infarction (STEMI) of anterior wall, subsequent episode of care (HCC) 2004   he had a proximal LAD occlusion treated with 2 overlapping 3.5 x 1.8 mm Cypher DES stents.    Past Surgical History:  Procedure Laterality Date   ANTERIOR CRUCIATE LIGAMENT REPAIR Left 1993   COLONOSCOPY     CORONARY ARTERY BYPASS GRAFT N/A 03/17/2024   Procedure: CORONARY ARTERY BYPASS GRAFTING TIMES THREE , USING LEFT INTERNAL MAMMARY ARTERY AND ENDOSCOPIC HARVESTED RIGHT SAPHENOUS VEIN;  Surgeon: Kerrin Elspeth BROCKS, MD;  Location: [facility] OR;  Service: Open Heart Surgery;  Laterality: N/A;   CORONARY BALLOON ANGIOPLASTY N/A 03/10/2024   Procedure: CORONARY BALLOON ANGIOPLASTY;  Surgeon: Anner Alm ORN, MD;  Location: [facility] INVASIVE CV LAB;  Service: Cardiovascular;  Laterality: N/A;   INTRAOPERATIVE TRANSESOPHAGEAL ECHOCARDIOGRAM N/A 04/08/2014   Procedure: INTRAOPERATIVE TRANSESOPHAGEAL ECHOCARDIOGRAM;  Surgeon: Sudie VEAR Laine, MD;  Location: [facility] OR;  Service: Open Heart Surgery;  Laterality: N/A;   INTRAOPERATIVE TRANSESOPHAGEAL ECHOCARDIOGRAM N/A 03/17/2024   Procedure: ECHOCARDIOGRAM, TRANSESOPHAGEAL, INTRAOPERATIVE;  Surgeon: Kerrin Elspeth BROCKS, MD;  Location: [facility] OR;  Service: Open Heart Surgery;  Laterality: N/A;   IR RADIOLOGIST EVAL & MGMT  10/04/2022   IR RADIOLOGIST EVAL & MGMT  11/01/2022   IR RADIOLOGIST EVAL & MGMT  12/06/2022   IR RADIOLOGIST EVAL & MGMT  01/23/2023   LEFT AND RIGHT HEART CATHETERIZATION WITH CORONARY ANGIOGRAM N/A 03/05/2014   Procedure: LEFT AND RIGHT HEART CATHETERIZATION WITH CORONARY ANGIOGRAM;  Surgeon: Alm ORN Anner, MD;  Location: [facility] CATH LAB;  Service:  Cardiovascular; (pre-op) Widely patent LAD stents, 40% distal LAD, 30% Cx, ~50% PL-PDA bifurcation lesion    LEFT HEART CATH AND CORONARY ANGIOGRAPHY  2004   In setting of anterior STEMI, found to have 100% % proximal LAD (2 DES Cypher stents)   LEFT HEART CATH AND CORONARY ANGIOGRAPHY  2007   Widely patent LAD stents,  40% distal LAD, 30% Cx, ~50% PL-PDA bifurcation lesion    LEFT HEART CATH AND CORONARY ANGIOGRAPHY N/A 03/10/2024   Procedure: LEFT HEART CATH AND CORONARY ANGIOGRAPHY;  Surgeon: Anner Alm ORN, MD;  Location: [facility] INVASIVE CV LAB;  Service: Cardiovascular;  Laterality: N/A;   LEFT HEART CATHETERIZATION WITH CORONARY ANGIOGRAM N/A 09/15/2011   Procedure: LEFT HEART CATHETERIZATION WITH CORONARY ANGIOGRAM;  Surgeon: Dorn JINNY Lesches, MD;  Location: [facility] CATH LAB;  Service: Cardiovascular;  Laterality: N/A;   MITRAL VALVE REPAIR Right 04/08/2014   Procedure: MINIMALLY INVASIVE MITRAL VALVE REPAIR (MVR);  Surgeon: Sudie VEAR Laine, MD;  Location: [facility] OR;  Service: Open Heart Surgery;  Laterality: Right;   NM MYOVIEW  LTD  12/2009   Walk 9 minutes, and 10 METs, diaphragmatic attenuation but no ischemia or infarction.   REPAIR OF ACUTE ASCENDING THORACIC AORTIC DISSECTION  03/17/2024   Procedure: REPAIR, AORTIC DISSECTION, ASCENDING USING 30 MM HEMASHIELD PLATINUM VASCULAR GRAFT RESUSPENSION OF AORTIC VALVE;  Surgeon: Kerrin Elspeth BROCKS, MD;  Location: [facility] OR;  Service: Open Heart Surgery;;   SVT ABLATION N/A 10/17/2019   Procedure: SVT ABLATION;  Surgeon: Inocencio Soyla Lunger, MD;  Location: [facility] INVASIVE CV LAB;  Service: Cardiovascular;  Laterality: N/A;   TEE WITHOUT CARDIOVERSION N/A 02/06/2014   Procedure: TRANSESOPHAGEAL ECHOCARDIOGRAM (TEE);  Surgeon: Vinie KYM Maxcy, MD;  Normal LV Size U& function - EF 55-60%, no regional WMA.  MV P2 Leaflet is flail with ruptured chord with severe prolapse, anterior leaflet intact.  Severe, eccentric anterior directed MR with dilated LA.   TRANSTHORACIC ECHOCARDIOGRAM  07/18/2018   Normal LV size and function.  EF 50-60 %.  Unable to assess diastolic function.  Mitral valve sewing ring in place.  Mild stenosis with a gradient of 6 mmHg noted. -   TRANSTHORACIC ECHOCARDIOGRAM  09/30/2021   Stable findings.  EF 50 to 55%.  No RWMA.  Mitral valve stable.  No  significant MR.  Normal aortic valve.  Mild aortic dilation, but probably normal for age.   VIDEO BRONCHOSCOPY WITH ENDOBRONCHIAL ULTRASOUND N/A 05/16/2019   Procedure: VIDEO BRONCHOSCOPY WITH ENDOBRONCHIAL ULTRASOUND;  Surgeon: Mannam, Praveen, MD;  Location: [facility] OR;  Service: Pulmonary;  Laterality: N/A;    Allergies  Allergen Reactions   A-Cillin [Ampicillin] Shortness Of Breath, Swelling and Rash    Did it involve swelling of the face/tongue/throat, SOB, or low BP? Yes Did it involve sudden or severe rash/hives, skin peeling, or any reaction on the inside of your mouth or nose? Yes Did you need to seek medical attention at a hospital or doctor's office? Yes When did it last happen? ~20 years ago If all above answers are NO, may proceed with cephalosporin use.     Amoxicillin Hives, Shortness Of Breath and Swelling    Did it involve swelling of the face/tongue/throat, SOB, or low BP? Yes Did it involve sudden or severe rash/hives, skin peeling, or any reaction on the inside of your mouth or nose? Yes Did you need to seek medical attention at a hospital or doctor's office? Yes When did it last happen? ~20 years ago If all above answers  are NO, may proceed with cephalosporin use.    Other Hives, Shortness Of Breath and Swelling    ALL CILLINS   Penicillins Hives, Shortness Of Breath and Swelling    Did it involve swelling of the face/tongue/throat, SOB, or low BP? Yes Did it involve sudden or severe rash/hives, skin peeling, or any reaction on the inside of your mouth or nose? Yes Did you need to seek medical attention at a hospital or doctor's office? Yes When did it last happen? ~20 years ago If all above answers are NO, may proceed with cephalosporin use.     Sulfa Antibiotics Nausea Only   Crestor  [Rosuvastatin ] Other (See Comments)    G I upset    Prior to Admission medications   Medication Sig Start Date End Date Taking? Authorizing Provider  Ascorbic Acid   (VITAMIN C ) 1000 MG tablet Take 2,000 mg by mouth daily.    Yes [provider]  atorvastatin  (LIPITOR) 10 MG tablet Take 1 tablet (10 mg total) by mouth daily. 05/09/23  Yes Anner Alm ORN, MD  Coenzyme Q10 (CO Q 10) 100 MG CAPS Take 100 mg by mouth daily.    Yes [provider]  diazepam  (VALIUM ) 2 MG tablet Take 1 tablet (2 mg total) by mouth every 12 (twelve) hours as needed for up to 12 doses for anxiety. 03/08/24  Yes Trifan, Donnice PARAS, MD  famotidine  (PEPCID ) 20 MG tablet Take 1 tablet (20 mg total) by mouth 2 (two) times daily. 03/08/24 03/24/2024 Yes Trifan, Donnice PARAS, MD  latanoprost  (XALATAN ) 0.005 % ophthalmic solution Place 1 drop into both eyes every morning. 04/26/23  Yes [provider]  magnesium  30 MG tablet Take 1 tablet by mouth at bedtime.   Yes [provider]  Melatonin 10 MG TABS Take 10 mg by mouth at bedtime.   Yes [provider]  NALTREXONE HCL, PAIN, PO Take 5 mg by mouth daily as needed (for pain).   Yes [provider]  OVER THE COUNTER MEDICATION Take 4 tablets by mouth in the morning, at noon, and at bedtime.  Relief Factor  from online.   Yes [provider]  Specialty Vitamins Products (PROSTATE PO) Take 1 capsule by mouth daily. PROSTATE FORMULA   Yes [provider]  sucralfate  (CARAFATE ) 1 g tablet Take 1 tablet (1 g total) by mouth 3 (three) times daily before meals. 03/08/24 03/14/2024 Yes Trifan, Donnice PARAS, MD  traMADol  (ULTRAM ) 50 MG tablet Take 50 mg by mouth 3 (three) times daily as needed for moderate pain (pain score 4-6). 10/03/22  Yes [provider]  vitamin B-12 (CYANOCOBALAMIN) 1000 MCG tablet Take 1,000 mcg by mouth daily.   Yes [provider]  Vitamin D-Vitamin K (D3 + K2 DOTS PO) Take 1 tablet by mouth daily.   Yes [provider]    Social History   Socioeconomic History   Marital status: Married    Spouse name: Not on file   Number of children: Not  on file   Years of education: Not on file   Highest education level: Not on file  Occupational History   Not on file  Tobacco Use   Smoking status: Never   Smokeless tobacco: Never  Vaping Use   Vaping status: Never Used  Substance and Sexual Activity   Alcohol use: Yes    Alcohol/week: 10.0 standard drinks of alcohol    Types: 5 Cans of beer, 5 Shots of liquor per week  Comment: social   Drug use: No   Sexual activity: Not on file  Other Topics Concern   Not on file  Social History Narrative   He is a married father of one. Exercises avidly as noted above - runs routinely at least 3 miles 3-4 times a day.  He drinks his Xango fruit juce - 32 Oz. Daily.     Never smoked and only takes occasional alcohol   Social Drivers of Corporate investment banker Strain: Not on file  Food Insecurity: No Food Insecurity (03/11/2024)   Hunger Vital Sign    Worried About Running Out of Food in the Last Year: Never true    Ran Out of Food in the Last Year: Never true  Transportation Needs: No Transportation Needs (03/11/2024)   PRAPARE - Administrator, Civil Service (Medical): No    Lack of Transportation (Non-Medical): No  Physical Activity: Not on file  Stress: Not on file  Social Connections: Socially Integrated (03/10/2024)   Social Connection and Isolation Panel    Frequency of Communication with Friends and Family: More than three times a week    Frequency of Social Gatherings with Friends and Family: More than three times a week    Attends Religious Services: More than 4 times per year    Active Member of [employer] or Organizations: Yes    Attends Engineer, structural: More than 4 times per year    Marital Status: Married  Catering manager Violence: Not At Risk (03/11/2024)   Humiliation, Afraid, Rape, and Kick questionnaire    Fear of Current or Ex-Partner: No    Emotionally Abused: No    Physically Abused: No    Sexually Abused: No   Family History  Problem  Relation Age of Onset   Hypertension Mother    Heart Problems Father        triple bypass 1989   Cancer Paternal Grandmother        Pancreatic    ROS: Otherwise negative unless mentioned in HPI  Physical Examination  Vitals:   03/24/24 0600 03/24/24 0630  BP: 138/66 (!) 136/58  Pulse: (!) 111 (!) 113  Resp: (!) 27 (!) 28  Temp:    SpO2: 93% 91%   Body mass index is 19.78 kg/m.  General:  WDWN in NAD Gait: Not observed HENT: WNL, normocephalic, nonrebreather, [place] Pulmonary: Nonrebreather, labored breathing Cardiac: Telemetry, tachycardia  Abdomen: soft, NT/ND, no masses Skin: without rashes Vascular Exam/Pulses: triphasic posterior tibial arteries bilaterally, multiphasic right dorsalis pedis artery, no signal in the left dorsalis pedis artery Femoral pulses readily palpable bilaterally, some concern for pseudoaneurysm on the right. Extremities: without ischemic changes, without Gangrene , without cellulitis; without open wounds;  Musculoskeletal: no muscle wasting or atrophy  Neurologic: Not alert, not oriented Psychiatric: Not alert, unable to assess Lymph:  Unremarkable  CBC    Component Value Date/Time   WBC 13.1 (H) 03/24/2024 0431   RBC 3.13 (L) 03/24/2024 0431   HGB 9.5 (L) 03/24/2024 0749   HGB 13.1 12/26/2023 1154   HCT 28.0 (L) 03/24/2024 0749   PLT 88 (L) 03/24/2024 0431   PLT 175 12/26/2023 1154   MCV 96.5 03/24/2024 0431   MCH 30.0 03/24/2024 0431   MCHC 31.1 03/24/2024 0431   RDW 17.0 (H) 03/24/2024 0431   LYMPHSABS 0.8 03/10/2024 1215   MONOABS 0.8 03/10/2024 1215   EOSABS 0.4 03/10/2024 1215   BASOSABS 0.1 03/10/2024 1215    BMET  Component Value Date/Time   NA 150 (H) 03/24/2024 0749   NA 141 02/25/2020 0915   K 3.9 03/24/2024 0749   CL 109 03/24/2024 0431   CO2 28 03/24/2024 0431   GLUCOSE 120 (H) 03/24/2024 0431   BUN 44 (H) 03/24/2024 0431   BUN 14 02/25/2020 0915   CREATININE 0.79 03/24/2024 0431   CREATININE 0.74  12/26/2023 1154   CREATININE 0.80 02/27/2014 1152   CALCIUM  7.5 (L) 03/24/2024 0431   GFRNONAA >60 03/24/2024 0431   GFRNONAA >60 12/26/2023 1154   GFRAA >60 05/12/2020 1008    COAGS: Lab Results  Component Value Date   INR 1.5 (H) 03/19/2024   INR 1.8 (H) 03/17/2024   INR 2.3 (H) 03/17/2024     ASSESSMENT/PLAN: This is a 68 y.o. male status post CABG x 3 complicated by dissection, ascending repair with resuspension of the aortic valve.  On physical exam, patient has triphasic signals in the posterior tibial arteries bilaterally.  I am no concern for limb ischemia at this time. The femoral pulses are readily palpable bilaterally.  The patient is thin.  There is some concern for pseudoaneurysm in the right groin, therefore I will order a duplex ultrasound to assess.  No plan for intervention at this time.   Will follow-up imaging.  Fonda FORBES Rim MD MS [facility] [phone] 03/24/2024  8:23 AM

## 2024-03-24 NOTE — Progress Notes (Addendum)
 NEUROLOGY CONSULT FOLLOW UP NOTE   Date of service: March 24, 2024 Patient Name: Alan [name] MRN:  [id number] DOB:  31-Mar-1956  Interval Hx/subjective  Reintubated for respiratory distress shortly before neurology arrived this morning    Vitals   Vitals:   03/24/24 0908 03/24/24 0909 03/24/24 0910 03/24/24 0911  BP:      Pulse: 92 100 99 (!) 105  Resp:      Temp: (!) 101.7 F (38.7 C) (!) 101.7 F (38.7 C) (!) 101.7 F (38.7 C) (!) 101.7 F (38.7 C)  TempSrc:      SpO2: (!) 85% (!) 86% (!) 86% 90%  Weight:      Height:         Body mass index is 19.78 kg/m.  Physical Exam   Constitutional: Alan [name] male who is intubated and sedated Eyes: No scleral injection.  HENT: Intubated Head: Normocephalic. No neck stiffness Respiratory: On the ventilator   Neurologic Examination   Intubated and sedated on propofol  gtt at a rate of 10, fentanyl  at a rate of 50 Ment: No responses to voice or noxious stimuli in the context of recent intubation and IV sedation. Eyes are closed without spontaneous opening.  CN: Eyes are midline with small unreactive pupils (sedated). No blink to threat. Absent corneal reflexes. Absent doll's eye reflexes.  Motor/Sensory: Flaccid tone x 4. No movement to noxious stimuli (sedated) Reflexes: Hypoactive throughout. Toes mute.  Cerebellar/Gait: Unable to assess   Medications  Current Facility-Administered Medications:    acetaminophen  (TYLENOL ) tablet 650 mg, 650 mg, Per Tube, Q6H, Claudene Toribio BROCKS, MD, 650 mg at 03/24/24 1054   albumin  human 5 % solution 12.5 g, 250 mL, Intravenous, Q15 min PRN, Gold, Wayne E, PA-C, Stopped at 03/18/24 0335   amiodarone  (PACERONE ) tablet 200 mg, 200 mg, Per Tube, Daily, Bitonti, Michael T, RPH, 200 mg at 03/24/24 1054   aspirin  EC tablet 325 mg, 325 mg, Oral, Daily **OR** aspirin  chewable tablet 324 mg, 324 mg, Per Tube, Daily, Gold, Wayne E, PA-C, 324 mg at 03/24/24 1054   atorvastatin  (LIPITOR) tablet  80 mg, 80 mg, Per Tube, Daily, Salam, Savannah B, RPH, 80 mg at 03/24/24 1054   aztreonam  (AZACTAM ) 2 g in sodium chloride  0.9 % 100 mL IVPB, 2 g, Intravenous, Q8H, Zenaida Morene PARAS, MD, Last Rate: 200 mL/hr at 03/24/24 0812, 2 g at 03/24/24 9187   bisacodyl  (DULCOLAX) EC tablet 10 mg, 10 mg, Oral, Daily, 10 mg at 03/24/24 1054 **OR** bisacodyl  (DULCOLAX) suppository 10 mg, 10 mg, Rectal, Daily, Gold, Wayne E, PA-C, 10 mg at 03/21/24 1035   Chlorhexidine  Gluconate Cloth 2 % PADS 6 each, 6 each, Topical, Daily, Kerrin Elspeth BROCKS, MD, 6 each at 03/24/24 1055   docusate (COLACE) 50 MG/5ML liquid 100 mg, 100 mg, Per Tube, BID, Claudene Toribio BROCKS, MD, 100 mg at 03/24/24 1054   famotidine  (PEPCID ) IVPB 20 mg premix, 20 mg, Intravenous, Q12H, Gretta Leita SQUIBB, DO, Stopped at 03/23/24 2229   feeding supplement (PROSource TF20) liquid 60 mL, 60 mL, Per Tube, BID, Clark, Laura P, DO, 60 mL at 03/24/24 1054   feeding supplement (VITAL 1.5 CAL) liquid 1,000 mL, 1,000 mL, Per Tube, Continuous, Gretta Leita SQUIBB, DO, Stopped at 03/24/24 9191   fentaNYL  (SUBLIMAZE ) bolus via infusion 25-100 mcg, 25-100 mcg, Intravenous, Q15 min PRN, Claudene Toribio BROCKS, MD   fentaNYL  in NS (31mcg/ml) infusion-PREMIX, 0-400 mcg/hr, Intravenous, Continuous, Claudene Toribio BROCKS, MD, Last Rate: 5 mL/hr at  03/24/24 0921, 50 mcg/hr at 03/24/24 0921   free water  200 mL, 200 mL, Per Tube, Q4H, Clark, Laura P, DO, 200 mL at 03/24/24 0400   hydrALAZINE  (APRESOLINE ) injection 10-20 mg, 10-20 mg, Intravenous, Q2H PRN, Lightfoot, Linnie KIDD, MD, 20 mg at 03/22/24 0154   insulin  aspart (novoLOG ) injection 0-24 Units, 0-24 Units, Subcutaneous, Q4H, Gretta Leita SQUIBB, DO, 2 Units at 03/24/24 0815   insulin  glargine-yfgn (SEMGLEE ) injection 15 Units, 15 Units, Subcutaneous, Daily, Gretta Leita SQUIBB, DO, 15 Units at 03/24/24 1058   ipratropium-albuterol  (DUONEB) 0.5-2.5 (3) MG/3ML nebulizer solution 3 mL, 3 mL, Nebulization, Q4H PRN, Gretta Leita P, DO    levETIRAcetam  (KEPPRA ) undiluted injection 1,000 mg, 1,000 mg, Intravenous, Q12H, Arora, Ashish, MD, 1,000 mg at 03/24/24 0820   midazolam  (VERSED ) injection 2 mg, 2 mg, Intravenous, Q1H PRN, Gold, Wayne E, PA-C, 2 mg at 03/20/24 1047   milrinone  (PRIMACOR ) 20 MG/100 ML (0.2 mg/mL) infusion, , , , , Stopped at 03/24/24 9075   norepinephrine  (LEVOPHED ) 4mg  in (0.016 mg/mL) premix infusion, 0-40 mcg/min, Intravenous, Titrated, Claudene Toribio BROCKS, MD, Last Rate: 7.5 mL/hr at 03/24/24 0956, 2 mcg/min at 03/24/24 9043   ondansetron  (ZOFRAN ) injection 4 mg, 4 mg, Intravenous, Q6H PRN, Gold, Wayne E, PA-C   Oral care mouth rinse, 15 mL, Mouth Rinse, 4 times per day, Kerrin Elspeth BROCKS, MD   Oral care mouth rinse, 15 mL, Mouth Rinse, PRN, Kerrin Elspeth BROCKS, MD   oxyCODONE  (Oxy IR/ROXICODONE ) immediate release tablet 5-10 mg, 5-10 mg, Per Tube, Q3H PRN, Salam, Savannah B, RPH, 5 mg at 03/19/24 1610   polyethylene glycol (MIRALAX  / GLYCOLAX ) packet 17 g, 17 g, Per Tube, Daily, Claudene Toribio BROCKS, MD, 17 g at 03/24/24 1054   propofol  (DIPRIVAN ) 1000 MG/100ML infusion, 0-80 mcg/kg/min, Intravenous, Continuous, Claudene Toribio BROCKS, MD, Last Rate: 4.42 mL/hr at 03/24/24 0919, 10 mcg/kg/min at 03/24/24 0919   sodium chloride  flush (NS) 0.9 % injection 10-40 mL, 10-40 mL, Intracatheter, Q12H, Kerrin Elspeth BROCKS, MD, 20 mL at 03/24/24 1058   sodium chloride  flush (NS) 0.9 % injection 3 mL, 3 mL, Intravenous, Q12H, Gold, Wayne E, PA-C, 3 mL at 03/23/24 2212   sodium chloride  flush (NS) 0.9 % injection 3 mL, 3 mL, Intravenous, PRN, Gold, Wayne E, PA-C, 3 mL at 03/21/24 0818   sodium chloride  HYPERTONIC 3 % nebulizer solution 4 mL, 4 mL, Nebulization, Q4H, Clark, Laura P, DO, 4 mL at 03/24/24 0307   vancomycin  (VANCOREADY) IVPB 1250 mg/250 mL, 1,250 mg, Intravenous, Q12H, Kerrin Elspeth BROCKS, MD, Last Rate: 166.7 mL/hr at 03/24/24 1053, 1,250 mg at 03/24/24 1053  Labs and Diagnostic Imaging   CBC:  Recent  Labs  Lab 03/23/24 0428 03/24/24 0334 03/24/24 0431 03/24/24 0749 03/24/24 1039  WBC 9.6  --  13.1*  --   --   HGB 9.3*   < > 9.4* 9.5* 8.8*  HCT 29.6*   < > 30.2* 28.0* 26.0*  MCV 97.7  --  96.5  --   --   PLT 55*  --  88*  --   --    < > = values in this interval not displayed.    Basic Metabolic Panel:  Lab Results  Component Value Date   NA 148 (H) 03/24/2024   K 4.0 03/24/2024   CO2 28 03/24/2024   GLUCOSE 120 (H) 03/24/2024   BUN 44 (H) 03/24/2024   CREATININE 0.79 03/24/2024   CALCIUM  7.5 (L) 03/24/2024   GFRNONAA >60  03/24/2024   GFRAA >60 05/12/2020   Lipid Panel:  Lab Results  Component Value Date   LDLCALC 58 03/12/2024   HgbA1c:  Lab Results  Component Value Date   HGBA1C 5.0 03/12/2024   Urine Drug Screen: No results found for: LABOPIA, COCAINSCRNUR, LABBENZ, AMPHETMU, THCU, LABBARB  Alcohol Level No results found for: [facility] INR  Lab Results  Component Value Date   INR 1.5 (H) 03/19/2024   APTT  Lab Results  Component Value Date   APTT 35 03/19/2024     Assessment  Alan [name] is a 68 y.o. male with history of CAD, hypertension, hyperlipidemia, mitral valve prolapse status postrepair, SVT status post ablation, metastatic small cell lung cancer in remission who was originally admitted after a non-STEMI and underwent CABG which was complicated by intraoperative aortic dissection.  After sedation was held, he was noted to have a lot of twitching with LPD's on the EEG along with myoclonic status epilepticus-2 episodes.  Hypoxic/anoxic brain injury is suspected but with weaning sedation, status started to improve when he is following midline commands.  MRI revealed very small volume scattered embolic infarcts keeping in line with the intraoperative dissection complication. - Reintubated for respiratory distress secondary to aspiration, shortly before neurology arrived this morning   - Exam today not informative due to recent intubation  with sedation.  - Overall picture has some contribution from strokes but is primarily an encephalopathy in the setting of acute infection and acute illness--multifactorial encephalopathy. - Impression - Acute ischemic infarcts in the setting of aortic dissection as a complication of CABG - Toxic metabolic encephalopathy in the setting of acute pneumonia    Recommendations  Continue supportive care Continue frequent neurochecks Antiplatelet per CTS-on aspirin  Expect improvement in mentation with resolution of pneumonia and hopefully re-extubation soon Neurohospitalist service will sign off. Please call if there are additional questions.  Discussed Neurology findings with CCM and patient's wife  I personally spent a total of 35 minutes in the care of this critically ill patient today including performing a medically appropriate exam/evaluation, counseling and educating, referring and communicating with other health care professionals, and documenting clinical information in the EHR.  ______________________________________________________________________   Bonney SHARK, Mitchelle Sultan, MD [facility]

## 2024-03-24 NOTE — Progress Notes (Signed)
   [address], [place] [address]             [phone]    Reintubated this morning for failure to protect airway, increased WOB  BP (!) 111/49   Pulse 95   Temp 99.7 F (37.6 C)   Resp 20   Ht 6' 4 (1.93 m)   Wt 73.7 kg   SpO2 98%   BMI 19.78 kg/m    Intake/Output Summary (Last 24 hours) at 03/24/2024 1630 Last data filed at 03/24/2024 1500 Gross per 24 hour  Intake 2581.27 ml  Output 4883 ml  Net -2301.73 ml   Diuresing well Temp trending down Continue current Rx  Alan [name] C. Kerrin, MD [facility] [phone]

## 2024-03-24 NOTE — Progress Notes (Signed)
 SLP Cancellation Note  Patient Details Name: Alan [name] MRN: [id number] DOB: [birth year]   Cancelled treatment:       Reason Eval/Treat Not Completed: Medical issues which prohibited therapy (intubated and sedated). SLP will f/u as able.    Damien Blumenthal, M.A., CCC-SLP Speech Language Pathology, Acute Rehabilitation Services  Secure Chat preferred [phone]  03/24/2024, 9:31 AM

## 2024-03-24 NOTE — Progress Notes (Signed)
 7 Days Post-Op Procedure(s) (LRB): CORONARY ARTERY BYPASS GRAFTING TIMES THREE , USING LEFT INTERNAL MAMMARY ARTERY AND ENDOSCOPIC HARVESTED RIGHT SAPHENOUS VEIN (N/A) ECHOCARDIOGRAM, TRANSESOPHAGEAL, INTRAOPERATIVE (N/A) REPAIR, AORTIC DISSECTION, ASCENDING USING 30 MM HEMASHIELD PLATINUM VASCULAR GRAFT RESUSPENSION OF AORTIC VALVE Subjective: Follows commands but nonverbal and less responsive  Objective: Vital signs in last 24 hours: Temp:  [99.9 F (37.7 C)-101.1 F (38.4 C)] 101.1 F (38.4 C) (08/11 0400) Pulse Rate:  [104-117] 113 (08/11 0630) Cardiac Rhythm: Sinus tachycardia (08/11 0400) Resp:  [18-31] 28 (08/11 0630) BP: (92-149)/(47-75) 136/58 (08/11 0630) SpO2:  [89 %-100 %] 91 % (08/11 0630) Arterial Line BP: (85-189)/(34-79) 165/52 (08/11 0400) Weight:  [73.7 kg] 73.7 kg (08/11 0500)  Hemodynamic parameters for last 24 hours: CVP:  [0 mmHg-14 mmHg] 6 mmHg  Intake/Output from previous day: 08/10 0701 - 08/11 0700 In: 3073.3 [I.V.:86.1; NG/GT:2520; IV Piggyback:467.2] Out: 4186 [Lmpwz:4354; Chest Tube:168] Intake/Output this shift: No intake/output data recorded.  General appearance: moderate distress and slowed mentation Neurologic: non focal but profoundly weak  Heart: tachy, regular Lungs: rhonchi bilaterally Abdomen: normal findings: soft, non-tender Extremities: LE mottled, L>R Wound: clean and dry  Lab Results: Recent Labs    03/23/24 0428 03/24/24 0431 03/24/24 0749  WBC 9.6 13.1*  --   HGB 9.3* 9.4* 9.5*  HCT 29.6* 30.2* 28.0*  PLT 55* 88*  --    BMET:  Recent Labs    03/23/24 0428 03/24/24 0431 03/24/24 0749  NA 152* 148* 150*  K 3.6 3.5 3.9  CL 114* 109  --   CO2 27 28  --   GLUCOSE 136* 120*  --   BUN 45* 44*  --   CREATININE 0.86 0.79  --   CALCIUM  7.5* 7.5*  --     PT/INR: No results for input(s): LABPROT, INR in the last 72 hours. ABG    Component Value Date/Time   PHART 7.436 03/24/2024 0749   HCO3 30.4 (H) 03/24/2024  0749   TCO2 32 03/24/2024 0749   ACIDBASEDEF 3.0 (H) 03/17/2024 2158   O2SAT 93 03/24/2024 0749   CBG (last 3)  Recent Labs    03/23/24 1935 03/23/24 2356 03/24/24 0431  GLUCAP 111* 123* 109*    Assessment/Plan: S/P Procedure(s) (LRB): CORONARY ARTERY BYPASS GRAFTING TIMES THREE , USING LEFT INTERNAL MAMMARY ARTERY AND ENDOSCOPIC HARVESTED RIGHT SAPHENOUS VEIN (N/A) ECHOCARDIOGRAM, TRANSESOPHAGEAL, INTRAOPERATIVE (N/A) REPAIR, AORTIC DISSECTION, ASCENDING USING 30 MM HEMASHIELD PLATINUM VASCULAR GRAFT RESUSPENSION OF AORTIC VALVE POD # 7 NEURO_ less alert today  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

## 2024-03-24 NOTE — Procedures (Signed)
 Bronchoscopy Procedure Note  SHELBY ANDERLE  [id number]  May 18, 1956  Date:03/24/24  Time:9:21 AM   Provider Performing:Cerria Randhawa C Claudene   Procedure(s):  Flexible bronchoscopy with bronchial alveolar lavage [phone]) and Initial Therapeutic Aspiration of Tracheobronchial Tree [phone])  Indication(s) Pneumonia, r/o mucus plugging  Consent Emergent  Anesthesia In place for ETT   Time Out Verified patient identification, verified procedure, site/side was marked, verified correct patient position, special equipment/implants available, medications/allergies/relevant history reviewed, required imaging and test results available.   Sterile Technique Usual hand hygiene, masks, gowns, and gloves were used   Procedure Description Bronchoscope advanced through endotracheal tube and into airway.  Airways were examined down to subsegmental level with findings noted below.     Findings:  - ETT in good position - Mucus plugging R mainstem complete, suctioned - Underlying RUL, RML, RLL acute bronchitic changes - Some abnormal mucosa around RML friable query recurrence vs. Radiation induced changes - BAL RLL cloudy return  Complications/Tolerance None; patient tolerated the procedure well. Chest X-ray is not needed post procedure.   EBL Minimal   Specimen(s) RLL BAL

## 2024-03-24 NOTE — Procedures (Signed)
 Bronchoscopy Procedure Note  Alan [name]  [id number]  07-20-1956  Date:03/24/24  Time:9:21 AM   Provider Performing:Regla Fitzgibbon C Claudene   Procedure(s):  Flexible bronchoscopy with bronchial alveolar lavage [phone]) and Initial Therapeutic Aspiration of Tracheobronchial Tree [phone])  Indication(s) Pneumonia, r/o mucus plugging  Consent Emergent  Anesthesia In place for ETT   Time Out Verified patient identification, verified procedure, site/side was marked, verified correct patient position, special equipment/implants available, medications/allergies/relevant history reviewed, required imaging and test results available.   Sterile Technique Usual hand hygiene, masks, gowns, and gloves were used   Procedure Description Bronchoscope advanced through endotracheal tube and into airway.  Airways were examined down to subsegmental level with findings noted below.     Findings:  - ETT in good position - Mucus plugging R mainstem complete, suctioned - Underlying RUL, RML, RLL acute bronchitic changes - Some abnormal mucosa around RML friable query recurrence vs. Radiation induced changes - BAL RLL cloudy return  Complications/Tolerance None; patient tolerated the procedure well. Chest X-ray is not needed post procedure.   EBL Minimal   Specimen(s) RLL BAL

## 2024-03-24 NOTE — Progress Notes (Addendum)
 03/24/2024 Timeline on intubation moved up due to desats Will see how next day or two goes on vent Would like a restaging scan and GOC discussion if considering trach route  Rolan Sharps MD PCCM

## 2024-03-24 NOTE — Progress Notes (Signed)
 Met with wife to discuss everything. She requested in writing which is below for reference.   Severe coronary artery disease (blood vessels that feed heart muscle) with prior stenting of these blood vessels.  One of these stents got blocked and able to be opened but heart was still weak so went to heart bypass surgery Aug 4. During bypass surgery it was found that not only was there issues with his heart vessels but also the aorta (big vessel that comes out of heart) was injured so the aorta and aortic valve (valve between aorta and heart) had to be replaced/repaired Because of the complicated nature of this surgery, Alan [name] needed lots of blood products, sedation, and time on a heart-lung bypass machine. As a result of needing to fix aorta, like some small clots broke off and caused very small strokes around the brain. With how sick he was to start with, his brain had trouble handling even these small strokes so he had a couple minor seizures and has persistent weak cough. After surgery, his issues are as follows: Brain: mostly delirium driving this (brain protective mechanism when so sick, waxes and wanes).  Overlying small strokes and sedation also will cause a slow recovery. Heart: heart is healing well and perfusing everything okay, it will take time to heal further Lungs: oral secretions and bacteria were entering airway (aspiration and bacterial pneumonia) Abdomen: doing okay for now Gentiourinary: kidneys doing okay for now Blood loss: fixed, numbers are slowly improving Weakness: biggest issue to date especially as sedentary before surgery.  He will need extensive rehab if everything goes okay. Blood vessels in legs: both large arteries/veins in legs were accessed to place patient on bypass machine: there is still blood flow to legs but it is a little weakened because of the blood loss post op and inflammation caused by aspiration above.  There is also a small injury to right femoral artery  that can be repaired at later date. Dysphagia: again time should show recovery here Plan Let brain, heart rest with sedation and mechanical ventilation through tomorrow Assuming everything goes okay, on Wednesday we need to determine following Are his lungs strong enough to handle breathing without vent? If no, give more time If we reach Friday and no progress, see trach discussion below If yes, take out breathing tube If does okay without breathing tube, work on rehab, brain recovery etc If fails again due to weak cough need to discuss tracheostomy as bridge to get off ventilator Tracheostomy discussion Tracheostomy is a small hole in front of neck to help folks get off ventilator and to rehab faster Does have small risks during insertion Used commonly but usually only offered if Patient and family are okay with placement in rehab facility or short term hospital to help wean from vent There are no other life-limiting conditions In Alan [name]'s case, would want to re-scan him to make sure no evidence of cancer recurrence as tracheostomy in this case likely only to prolong life quantity but not quality Larger things to think about in case things don't go well What sort of quality of life would Alan [name] be okay with: would he be okay stuck on a ventilator for a time while we see how brain/heart recovers? What sort of quality of life was Alan [name] having pre-hospital; was it getting to point where he was suffering and at what point do you think Alan [name] would say we've done too much? What if Alan [name] could never get strong enough to go home?

## 2024-03-24 NOTE — Progress Notes (Signed)
 Patient with signals in the feet bilaterally Aortic duplex ultrasound demonstrates AV fistula in the right groin.   Plan for conservative management at this point.  In many cases, iatrogenic AV fistula is closed without requiring intervention.   Will continue to follow

## 2024-03-25 ENCOUNTER — Inpatient Hospital Stay (HOSPITAL_COMMUNITY)

## 2024-03-25 DIAGNOSIS — J939 Pneumothorax, unspecified: Secondary | ICD-10-CM

## 2024-03-25 DIAGNOSIS — R739 Hyperglycemia, unspecified: Secondary | ICD-10-CM | POA: Diagnosis not present

## 2024-03-25 DIAGNOSIS — Z515 Encounter for palliative care: Secondary | ICD-10-CM

## 2024-03-25 DIAGNOSIS — J90 Pleural effusion, not elsewhere classified: Secondary | ICD-10-CM

## 2024-03-25 DIAGNOSIS — Z7189 Other specified counseling: Secondary | ICD-10-CM | POA: Diagnosis not present

## 2024-03-25 DIAGNOSIS — Z9889 Other specified postprocedural states: Secondary | ICD-10-CM

## 2024-03-25 DIAGNOSIS — I75023 Atheroembolism of bilateral lower extremities: Secondary | ICD-10-CM

## 2024-03-25 DIAGNOSIS — R4589 Other symptoms and signs involving emotional state: Secondary | ICD-10-CM

## 2024-03-25 DIAGNOSIS — I5021 Acute systolic (congestive) heart failure: Secondary | ICD-10-CM | POA: Diagnosis not present

## 2024-03-25 DIAGNOSIS — G9341 Metabolic encephalopathy: Secondary | ICD-10-CM

## 2024-03-25 DIAGNOSIS — J189 Pneumonia, unspecified organism: Secondary | ICD-10-CM | POA: Diagnosis not present

## 2024-03-25 DIAGNOSIS — Z951 Presence of aortocoronary bypass graft: Secondary | ICD-10-CM

## 2024-03-25 DIAGNOSIS — I770 Arteriovenous fistula, acquired: Secondary | ICD-10-CM

## 2024-03-25 DIAGNOSIS — D62 Acute posthemorrhagic anemia: Secondary | ICD-10-CM | POA: Diagnosis not present

## 2024-03-25 DIAGNOSIS — J9601 Acute respiratory failure with hypoxia: Secondary | ICD-10-CM | POA: Diagnosis not present

## 2024-03-25 DIAGNOSIS — I214 Non-ST elevation (NSTEMI) myocardial infarction: Secondary | ICD-10-CM | POA: Diagnosis not present

## 2024-03-25 DIAGNOSIS — E870 Hyperosmolality and hypernatremia: Secondary | ICD-10-CM | POA: Diagnosis not present

## 2024-03-25 LAB — CBC
HCT: 24.5 % — ABNORMAL LOW (ref 39.0–52.0)
HCT: 25.1 % — ABNORMAL LOW (ref 39.0–52.0)
Hemoglobin: 7.6 g/dL — ABNORMAL LOW (ref 13.0–17.0)
Hemoglobin: 7.8 g/dL — ABNORMAL LOW (ref 13.0–17.0)
MCH: 30.8 pg (ref 26.0–34.0)
MCH: 30.4 pg (ref 26.0–34.0)
MCHC: 31 g/dL (ref 30.0–36.0)
MCHC: 31.1 g/dL (ref 30.0–36.0)
MCV: 99.2 fL (ref 80.0–100.0)
MCV: 97.7 fL (ref 80.0–100.0)
Platelets: 76 K/uL — ABNORMAL LOW (ref 150–400)
Platelets: 80 K/uL — ABNORMAL LOW (ref 150–400)
RBC: 2.47 MIL/uL — ABNORMAL LOW (ref 4.22–5.81)
RBC: 2.57 MIL/uL — ABNORMAL LOW (ref 4.22–5.81)
RDW: 16.5 % — ABNORMAL HIGH (ref 11.5–15.5)
RDW: 16.5 % — ABNORMAL HIGH (ref 11.5–15.5)
WBC: 8.1 K/uL (ref 4.0–10.5)
WBC: 9.7 K/uL (ref 4.0–10.5)
nRBC: 0.4 % — ABNORMAL HIGH (ref 0.0–0.2)
nRBC: 0.4 % — ABNORMAL HIGH (ref 0.0–0.2)

## 2024-03-25 LAB — GLUCOSE, CAPILLARY
Glucose-Capillary: 108 mg/dL — ABNORMAL HIGH (ref 70–99)
Glucose-Capillary: 117 mg/dL — ABNORMAL HIGH (ref 70–99)
Glucose-Capillary: 119 mg/dL — ABNORMAL HIGH (ref 70–99)
Glucose-Capillary: 125 mg/dL — ABNORMAL HIGH (ref 70–99)
Glucose-Capillary: 126 mg/dL — ABNORMAL HIGH (ref 70–99)
Glucose-Capillary: 133 mg/dL — ABNORMAL HIGH (ref 70–99)
Glucose-Capillary: 146 mg/dL — ABNORMAL HIGH (ref 70–99)

## 2024-03-25 LAB — BASIC METABOLIC PANEL WITH GFR
Anion gap: 9 (ref 5–15)
Anion gap: 10 (ref 5–15)
BUN: 55 mg/dL — ABNORMAL HIGH (ref 8–23)
BUN: 54 mg/dL — ABNORMAL HIGH (ref 8–23)
CO2: 28 mmol/L (ref 22–32)
CO2: 28 mmol/L (ref 22–32)
Calcium: 7.8 mg/dL — ABNORMAL LOW (ref 8.9–10.3)
Calcium: 8.3 mg/dL — ABNORMAL LOW (ref 8.9–10.3)
Chloride: 110 mmol/L (ref 98–111)
Chloride: 113 mmol/L — ABNORMAL HIGH (ref 98–111)
Creatinine, Ser: 0.79 mg/dL (ref 0.61–1.24)
Creatinine, Ser: 0.82 mg/dL (ref 0.61–1.24)
GFR, Estimated: >60 mL/min (ref >60)
GFR, Estimated: >60 mL/min (ref >60)
Glucose, Bld: 132 mg/dL — ABNORMAL HIGH (ref 70–99)
Glucose, Bld: 134 mg/dL — ABNORMAL HIGH (ref 70–99)
Potassium: 3.4 mmol/L — ABNORMAL LOW (ref 3.5–5.1)
Potassium: 3.7 mmol/L (ref 3.5–5.1)
Sodium: 147 mmol/L — ABNORMAL HIGH (ref 135–145)
Sodium: 151 mmol/L — ABNORMAL HIGH (ref 135–145)

## 2024-03-25 LAB — LACTATE DEHYDROGENASE: LDH: 338 U/L — ABNORMAL HIGH (ref 98–192)

## 2024-03-25 LAB — COOXEMETRY PANEL
Carboxyhemoglobin: 1.8 % — ABNORMAL HIGH (ref 0.5–1.5)
Methemoglobin: <0.7 % (ref 0.0–1.5)
O2 Saturation: 67.5 %
Total hemoglobin: 7.8 g/dL — ABNORMAL LOW (ref 12.0–16.0)

## 2024-03-25 LAB — PROTEIN, TOTAL: Total Protein: 5.2 g/dL — ABNORMAL LOW (ref 6.5–8.1)

## 2024-03-25 LAB — LACTATE DEHYDROGENASE, PLEURAL OR PERITONEAL FLUID: LD, Fluid: 418 U/L — ABNORMAL HIGH (ref 3–23)

## 2024-03-25 LAB — TRIGLYCERIDES: Triglycerides: 230 mg/dL — ABNORMAL HIGH (ref <150)

## 2024-03-25 LAB — MAGNESIUM: Magnesium: 2.6 mg/dL — ABNORMAL HIGH (ref 1.7–2.4)

## 2024-03-25 LAB — PROTEIN, PLEURAL OR PERITONEAL FLUID: Total protein, fluid: <3 g/dL

## 2024-03-25 LAB — GLUCOSE, PLEURAL OR PERITONEAL FLUID: Glucose, Fluid: 106 mg/dL

## 2024-03-25 LAB — PROCALCITONIN: Procalcitonin: 1.76 ng/mL

## 2024-03-25 MED ORDER — POTASSIUM CHLORIDE 20 MEQ PO PACK
40.0000 meq | PACK | Freq: Once | ORAL | Status: AC
  Administered 2024-03-25 (×2): 40 meq via ORAL
  Filled 2024-03-25: qty 2

## 2024-03-25 MED ORDER — FREE WATER
200.0000 mL | Freq: Four times a day (QID) | Status: DC
  Administered 2024-03-25 – 2024-03-29 (×23): 200 mL

## 2024-03-25 MED ORDER — POTASSIUM CHLORIDE CRYS ER 20 MEQ PO TBCR
40.0000 meq | EXTENDED_RELEASE_TABLET | Freq: Once | ORAL | Status: DC
Start: 2024-03-25 — End: 2024-03-25

## 2024-03-25 MED ORDER — SPIRONOLACTONE 25 MG PO TABS
25.0000 mg | ORAL_TABLET | Freq: Every day | ORAL | Status: DC
  Administered 2024-03-25 – 2024-03-26 (×4): 25 mg
  Filled 2024-03-25 (×2): qty 1

## 2024-03-25 MED ORDER — SODIUM CHLORIDE 0.9% FLUSH
10.0000 mL | Freq: Three times a day (TID) | INTRAVENOUS | Status: DC
  Administered 2024-03-25 – 2024-04-01 (×26): 10 mL via INTRAPLEURAL

## 2024-03-25 MED ORDER — FUROSEMIDE 10 MG/ML IJ SOLN
80.0000 mg | Freq: Two times a day (BID) | INTRAMUSCULAR | Status: AC
  Administered 2024-03-25 – 2024-03-26 (×8): 80 mg via INTRAVENOUS
  Filled 2024-03-25 (×4): qty 8

## 2024-03-25 MED ORDER — POTASSIUM CHLORIDE 20 MEQ PO PACK
20.0000 meq | PACK | ORAL | Status: DC
  Administered 2024-03-25 (×4): 20 meq
  Filled 2024-03-25 (×2): qty 1

## 2024-03-25 MED ORDER — ENOXAPARIN SODIUM 40 MG/0.4ML IJ SOSY: 40.0000 mg | PREFILLED_SYRINGE | INTRAMUSCULAR | Status: DC

## 2024-03-25 NOTE — Progress Notes (Signed)
 EVENING ROUNDS NOTE :     [address]       [place] [address]             [phone]                 8 Days Post-Op Procedure(s) (LRB): CORONARY ARTERY BYPASS GRAFTING TIMES THREE , USING LEFT INTERNAL MAMMARY ARTERY AND ENDOSCOPIC HARVESTED RIGHT SAPHENOUS VEIN (N/A) ECHOCARDIOGRAM, TRANSESOPHAGEAL, INTRAOPERATIVE (N/A) REPAIR, AORTIC DISSECTION, ASCENDING USING 30 MM HEMASHIELD PLATINUM VASCULAR GRAFT RESUSPENSION OF AORTIC VALVE   Total Length of Stay:  LOS: 15 days  Events:   No events Chest tube placed with return of SS fluid    BP (!) 134/55   Pulse 95   Temp 100 F (37.8 C)   Resp 20   Ht 6' 4 (1.93 m)   Wt 76.8 kg   SpO2 95%   BMI 20.61 kg/m   CVP:  [3 mmHg-18 mmHg] 7 mmHg  Vent Mode: PRVC FiO2 (%):  [40 %-50 %] 40 % Set Rate:  [20 bmp] 20 bmp Vt Set:  [379 mL] 620 mL PEEP:  [5 cmH20] 5 cmH20 Plateau Pressure:  [17 cmH20-21 cmH20] 17 cmH20   albumin  human Stopped (03/18/24 0335)   aztreonam  2 g (03/25/24 1527)   famotidine  (PEPCID ) IV Stopped (03/25/24 1138)   feeding supplement (VITAL 1.5 CAL) 60 mL/hr at 03/25/24 1500   fentaNYL  infusion INTRAVENOUS 50 mcg/hr (03/25/24 1500)   norepinephrine  (LEVOPHED ) Adult infusion Stopped (03/25/24 0202)   propofol  (DIPRIVAN ) infusion 5 mcg/kg/min (03/25/24 1500)   vancomycin  Stopped (03/25/24 1322)    I/O last 3 completed shifts: In: 5417.9 [I.V.:552.6; NG/GT:3335.3; IV Piggyback:1529.9] Out: 6353 [Urine:5995; Chest Tube:358]      Latest Ref Rng & Units 03/25/2024   11:40 AM 03/25/2024    4:30 AM 03/24/2024   10:39 AM  CBC  WBC 4.0 - 10.5 K/uL 9.7  8.1    Hemoglobin 13.0 - 17.0 g/dL 7.8  7.6  8.8   Hematocrit 39.0 - 52.0 % 25.1  24.5  26.0   Platelets 150 - 400 K/uL 80  76         Latest Ref Rng & Units 03/25/2024   11:40 AM 03/25/2024    4:30 AM 03/24/2024   10:39 AM  BMP  Glucose 70 - 99 mg/dL 865  867    BUN 8 - 23 mg/dL 54  55    Creatinine 9.38 - 1.24 mg/dL 9.17  9.20     Sodium 864 - 145 mmol/L 151  147  148   Potassium 3.5 - 5.1 mmol/L 3.7  3.4  4.0   Chloride 98 - 111 mmol/L 113  110    CO2 22 - 32 mmol/L 28  28    Calcium  8.9 - 10.3 mg/dL 8.3  7.8      ABG    Component Value Date/Time   PHART 7.376 03/24/2024 1039   PCO2ART 51.5 (H) 03/24/2024 1039   PO2ART 231 (H) 03/24/2024 1039   HCO3 29.8 (H) 03/24/2024 1039   TCO2 31 03/24/2024 1039   ACIDBASEDEF 3.0 (H) 03/17/2024 2158   O2SAT 67.5 03/25/2024 0430       Alan Rayas, MD 03/25/2024 5:15 PM

## 2024-03-25 NOTE — Progress Notes (Signed)
 Advanced Heart Failure Rounding Note  Cardiologist: [name], MD   Chief Complaint: Post-op CABG and Bentall procedure   Subjective:   8/11 - Reintubated. S/P Bronch. Septic picture. Vascular consulted. No DVT.  R AV fistula R groin, conservative  management.   POD #8 Off pressors.  Remains intubated.    Objective:   Weight Range: 76.8 kg Body mass index is 20.61 kg/m.   Vital Signs:   Temp:  [99.3 F (37.4 C)-101.7 F (38.7 C)] 99.7 F (37.6 C) (08/12 0700) Pulse Rate:  [79-116] 87 (08/12 0700) Resp:  [17-34] 20 (08/12 0700) BP: (95-166)/(40-61) 104/56 (08/12 0700) SpO2:  [79 %-100 %] 98 % (08/12 0700) Arterial Line BP: (87-192)/(23-93) 122/69 (08/12 0700) FiO2 (%):  [40 %-100 %] 40 % (08/12 0400) Weight:  [76.8 kg] 76.8 kg (08/12 0500) Last BM Date : 03/24/24  Weight change: Filed Weights   03/23/24 0500 03/24/24 0500 03/25/24 0500  Weight: 86 kg 73.7 kg 76.8 kg    Intake/Output:   Intake/Output Summary (Last 24 hours) at 03/25/2024 0733 Last data filed at 03/25/2024 0700 Gross per 24 hour  Intake 4097.34 ml  Output 3625 ml  Net 472.34 ml      Physical Exam  General:  Intubated. +[place] Neck: JVP 8-9 Cor: Regular rate & rhythm.  Lungs: Rhonchi Abdomen: soft, nontender, nondistended.  Extremities:RLE ecchymotic. R groin thrill. Unable to doppler L pedal. R pedal + doppler signal. Feet cold/mottled Neuro:intubated/sedated GU: Foley  Telemetry   SR 80s   Labs    CBC Recent Labs    03/24/24 0431 03/24/24 0749 03/24/24 1039 03/25/24 0430  WBC 13.1*  --   --  8.1  HGB 9.4*   < > 8.8* 7.6*  HCT 30.2*   < > 26.0* 24.5*  MCV 96.5  --   --  99.2  PLT 88*  --   --  76*   < > = values in this interval not displayed.   Basic Metabolic Panel Recent Labs    91/88/74 0431 03/24/24 0749 03/24/24 1039 03/25/24 0430  NA 148*   < > 148* 147*  K 3.5   < > 4.0 3.4*  CL 109  --   --  110  CO2 28  --   --  28  GLUCOSE 120*  --   --   132*  BUN 44*  --   --  55*  CREATININE 0.79  --   --  0.79  CALCIUM  7.5*  --   --  7.8*  MG 2.4  --   --  2.6*   < > = values in this interval not displayed.     Medications:     Scheduled Medications:  acetaminophen   650 mg Per Tube Q6H   amiodarone   200 mg Per Tube Daily   aspirin  EC  325 mg Oral Daily   Or   aspirin   324 mg Per Tube Daily   atorvastatin   80 mg Per Tube Daily   bisacodyl   10 mg Oral Daily   Or   bisacodyl   10 mg Rectal Daily   Chlorhexidine  Gluconate Cloth  6 each Topical Daily   docusate  100 mg Per Tube BID   feeding supplement (PROSource TF20)  60 mL Per Tube TID   free water   200 mL Per Tube Q4H   insulin  aspart  0-24 Units Subcutaneous Q4H   insulin  glargine-yfgn  15 Units Subcutaneous Daily   levETIRAcetam   1,000 mg Intravenous  Q12H   mouth rinse  15 mL Mouth Rinse Q2H   polyethylene glycol  17 g Per Tube Daily   potassium chloride   20 mEq Per Tube Q4H   sodium chloride  flush  10-40 mL Intracatheter Q12H   sodium chloride  flush  3 mL Intravenous Q12H   sodium chloride  HYPERTONIC  4 mL Nebulization Q4H    Infusions:  albumin  human Stopped (03/18/24 0335)   aztreonam  Stopped (03/25/24 0554)   famotidine  (PEPCID ) IV Stopped (03/24/24 2154)   feeding supplement (VITAL 1.5 CAL) 60 mL/hr at 03/25/24 0700   fentaNYL  infusion INTRAVENOUS 50 mcg/hr (03/25/24 0700)   norepinephrine  (LEVOPHED ) Adult infusion Stopped (03/25/24 0202)   propofol  (DIPRIVAN ) infusion 10 mcg/kg/min (03/25/24 0700)   vancomycin  Stopped (03/25/24 0019)    PRN Medications: albumin  human, fentaNYL , hydrALAZINE , ipratropium-albuterol , midazolam , ondansetron  (ZOFRAN ) IV, mouth rinse, oxyCODONE , sodium chloride  flush    Patient Profile  68 y.o. male with history of CAD, severe MR s/p minimally invasive mitral valve repair, stage III NSCLC.   Patient admitted with NSTEMI and new systolic CHF. Underwent 3v CABG X 3 c/b ascending aortic dissection.  Assessment/Plan   NSTEMI  with known CAD -Remote anterior STEMI with prior stents to LAD -NSTEMI - 3 V CAD on cath with 99% in-stent restenosis in LAD, 50% residual stenosis post PTCA -CABG X 3 (LIMA to LAD, SVG to OM, SBVG to PDA) 03/17/24 as above -Aspirin  + statin.   Ascending Aortic Dissection  - complication of CABG - s/p Bentall and resuspenison of AoV    Acute systolic CHF/ICM -Newly reduced EF -Echo 03/10/24: EF 30-35%, anterior WMA, RV mildly reduced -Post CABG limited echo 8/7: EF improving, 40-45% w/ septal dyssynchrony  -Volume overloaded. Give 80 mg IV lasix  twice a day.  - Add 25 mg spiro daily   Acute hypoxic resp failure/ Suspected PNA  - Tracheal aspirate with parainfluenzae + GPCs - 8/11 Reintubated.  -On vanc/azactam   Fever -Concern about sepsis. As above on vanc and azactam   -Blood cultures- pending.   -Respiratory CX- Rare gram +  cocci  Severe MR s/p minimally invasive mitral valve repair -trivial MR with mean gradient of 4 mmhg on echo this admit    Postop anemia/thrombocytopenia -Multiple blood products in OR and immediately post-op -Hgb trending down 7.6 today. Hold off on DVT prophylaxis. Check CBC this afternoon. If hgb lower will transfuse.  -Platelets improving 76K  -Follow closely -Has not been on heparin  post-op. Holding DVT prophylaxis.  NSVT -No recurrence -On po amio daily , may need to restart amio drip if has increased frequency.    NSCLC w/bone mets -S/p chemoradiation and immunotherapy -Stable disease on most recent outpatinet imaging. Followed by Heme/Onc -Possible new sclerotic lesion/small LUL nodule on CXR this admission. Will eventually require CT.   Myoclonic seizures - Resolved. Suspect hypoxic/anoxic brain injury - Neurology following - Off sedation and following commands.  - Keppra  - MRI brain with multiple small infarcts   Left pneumothorax - per CT surgery keep left chest tube    Lower Extremities Mottling VVS consulted.  US  R groin -AV  Fistula. No plan for intervention.   Check CBC/BMET this afternoon    CRITICAL CARE Performed by: Greig Mosses NP-C    Total critical care time:  20 minutes  Critical care time was exclusive of separately billable procedures and treating other patients.  Critical care was necessary to treat or prevent imminent or life-threatening deterioration.  Critical care was time spent personally by me  on the following activities: development of treatment plan with patient and/or surrogate as well as nursing, discussions with consultants, evaluation of patient's response to treatment, examination of patient, obtaining history from patient or surrogate, ordering and performing treatments and interventions, ordering and review of laboratory studies, ordering and review of radiographic studies, pulse oximetry and re-evaluation of patient's condition.

## 2024-03-25 NOTE — Progress Notes (Signed)
  Daily Progress Note  S/p: Right groin iatrogenic fistula, likely atheroembolic event to the feet  Subjective: Intubated, sedated  Objective: Vitals:   03/25/24 0645 03/25/24 0700  BP: (!) 113/52 (!) 104/56  Pulse: 88 87  Resp: 20 20  Temp: 99.5 F (37.5 C) 99.7 F (37.6 C)  SpO2: 99% 98%    Physical Examination Palpable pulses in the right foot, multiphasic signals in the left foot, specifically the posterior tibial artery Mottling of the toes appreciated Groin soft, no expanding hematoma  ASSESSMENT/PLAN:  Patient is 68 year old male whom vascular is following for right-sided femoral echogenic fistula, palpable pulses in the right foot Atheroembolic event to both feet with mottling at the toes.  Will allow for demarcation.   Fonda FORBES Rim MD MS [facility] [phone] 03/25/2024  12:07 PM

## 2024-03-25 NOTE — Consult Note (Addendum)
 Palliative Medicine Inpatient Consult Note  Consulting Provider: Meade Sammi [name]   Reason for consult:   Palliative Care Consult Services Palliative Medicine Consult  Reason for Consult? goals of care   03/25/2024  HPI:  Per intake H&P -->  Alan [name] is a 68 year old male with a past medical history of CAD (s/p STEMI with PCI and DES to LAD 2004) followed by Dr. Anner, HTN, HLD, mitral valve prolapse (s/p mini mitral valve repair utilizing a 34mm Sorin Memo 3D [place]ord ring annuloplasty by Dr. Dusty 2015), SVT (s/p ablation 2021), and metastatic lung cancer (s/p palliative radiotherapy, now stable). Has had a complicated hospitalization > 2 weeks with CABG x3, myclonic event, PNA - intubation x2. Palliative care has been ask to support additional goals of care conversations.   Clinical Assessment/Goals of Care:  *Please note that this is a verbal dictation therefore any spelling or grammatical errors are due to the Dragon Medical One system interpretation.  I have reviewed medical records including EPIC notes, labs and imaging, received report from bedside RN, assessed the patient who is lying in bed intubated with coretrack feedings.    I met with patient's wife, Alan [name] to further discuss diagnosis prognosis, GOC, EOL wishes, disposition and options.   I introduced Palliative Medicine as specialized medical care for people living with serious illness. It focuses on providing relief from the symptoms and stress of a serious illness. The goal is to improve quality of life for both the patient and the family.  Medical History Review and Understanding:  A review of Alan [name]'s past medical history inclusive of previous STEMI requiring drug-eluting stent, hypertension, back pain (for one year), MVP, metastatic lung cancer with recurrence, and coronary artery disease was completed.  Social History:  Alan [name] lives in [place]  .  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  He and his wife have been married for over 40 years and share a daughter who passed away at the age of 67 and a son.  Alan [name] is a man of faith practicing within Catholicism.  Alan [name] enjoys spin classes and hitting golf balls.  Functional and Nutritional State:  Prior to hospitalization Alan [name] had lived in a single-family home with his wife.  He was able to complete all B ADLs and IADLs though had some inhibition in the setting of back pain as well as some cognitive impairment.  He is noted to have had a decreased appetite and overall more lethargy preceding hospitalization.  Advance Directives:  A detailed discussion was had today regarding advanced directives.  Alan [name] does have advanced directives which his wife shares her at home and she does understand what his wishes are. Alan [name] is Alan [name], broadcasting/film/video.  Code Status:  Concepts specific to code status, artifical feeding and hydration, continued IV antibiotics and rehospitalization was had.  The difference between a aggressive medical intervention path  and a palliative comfort care path for this patient at this time was had.   I discussed with Alan [name] the present level of life support that Alan [name] is on and reviewed whether or not from her perspective he would want to undergo chest compressions should he [place] cardiac arrest.  At this point in time Alan [name] would like to consider this as well as speak to their son further about this.  She shares that by no means would she want Alan [name] to suffer or Alan [name] any more pain than  he already has.  Discussion:  Alan [name] shares a timeline of Alan's decline starting about 7 years ago when he was first diagnosed with lung cancer.  She shares over the past 3 months he has had a more difficult time from a cognition perspective he also has had a decrease in his overall mood.  Alan had increased fatigue in the home which was more notable over the past few months as well.  Alan [name] has gone to his general practice provider as well as a holistic physician and the unified decision was made for him to go to a psychologist for further cognitive behavioral therapy.  Alan [name] and I reviewed the symptoms leading to an ER visit inclusive of chest pain after hitting both golf balls 1 day.  He had been at that time diagnosed with GERD and went home over the next 2 days his pain got much worse and he saw [facility] physicians it was there that they noted some changes in his EKGs and he was sent to the [facility] urgently.  Since hospitalization Alan [name] has had a complicated journey in the setting of procedures as well as pneumonia.  It had been described to patient's wife that he likely had some anoxic injury due to a prolonged procedure time in the setting of performing a CABG which led to aortic dissection requiring AV graft placement and AVR.   Alan [name] shares there was no hope for improvement thoughi miraculously Alan [name] was able to wake up and communicate with her.  It was thereafter that he had been identified to have pneumonia and had to [facility] reintubation.  She shares they have had a very complex relationship over the past 40 years filled with highs and lows.  She notes a lot of wary associated with her sister-in-law and how she is approaching the situation and contending with her own grief related to Alan's illness.  Alan [name] shares she will make decisions as they come and does remain hopeful.  She has a tremendous amount of faith and that there is always a greater way in relation to the larger picture as well as God plan. Alan [name] shared readiness to let Alan [name] go when it is his time. She also notes that she has knowledge of hospice as her daughter underwent their services.   Discussed the importance of continued conversation with family and their  medical providers regarding overall plan of care and treatment options, ensuring decisions are within the context of the patients values and GOCs.  Decision  Maker: Alan [name], Alan [name] (Spouse): [phone] (Mobile)   SUMMARY OF RECOMMENDATIONS   FULL CODE/FULL SCOPE OF CARE - Discussed with patients wife if chest compressions would be desire given patients h/o of chronic pain and the burden associated with compressions. She plans to talk about this more with her son.  Patient wife remains hopeful for improvement  Continue present measures at this time  Spiritual support is provided by patients church - [employer] [place] [employer] [name]  PMT will continue to follow along  Code Status/Advance Care Planning: FULL CODE  Palliative Prophylaxis:  Aspiration, Bowel Regimen, Delirium Protocol, Frequent Pain Assessment, Oral Care, Palliative Wound Care, and Turn Reposition  Additional Recommendations (Limitations, Scope, Preferences): Continue current care  Psycho-social/Spiritual:  Desire for further Chaplaincy support: Not presently Additional Recommendations: Support related to chronic disease  Prognosis: Patient is critically ill, presently prognosis appears quite poor  Discharge Planning: To be determined  Vitals:   03/25/24 0645 03/25/24 0700  BP: (!) 113/52 (!) 104/56  Pulse: 88 87  Resp: 20 20  Temp: 99.5 F (37.5 C) 99.7 F (37.6 C)  SpO2: 99% 98%    Intake/Output Summary (Last 24 hours) at 03/25/2024 1249 Last data filed at 03/25/2024 1105 Gross per 24 hour  Intake 4646.58 ml  Output 4705 ml  Net -58.42 ml   Last Weight  Most recent update: 03/25/2024  6:52 AM    Weight  76.8 kg (169 lb 5 oz)            Gen: Elderly critically ill appearing Caucasian M HEENT: ETT, corte-track, dry mucous membranes CV: Regular rate and rhythm  PULM: On Mechanical ventilator ABD: soft/nontender  EXT: No edema  Neuro:  Somnolent  PPS: 10%   This conversation/these recommendations were discussed with patient primary care team, CCM, APP Desai ______________________________________________________ Alan [name] Alan [name] Palliative Medicine  Team Team Cell Phone: [phone] Please utilize secure chat with additional questions, if there is no response within 30 minutes please call the above phone number  Total Time: 75 Billing based on MDM: High  Palliative Medicine Team providers are available by phone from 7am to 7pm daily and can be reached through the team cell phone.  Should this patient require assistance outside of these hours, please call the patient's attending physician.

## 2024-03-25 NOTE — Progress Notes (Signed)
 PT Cancellation Note  Patient Details Name: Alan [name] MRN: [id number] DOB: 1956/02/16   Cancelled Treatment:    Reason Eval/Treat Not Completed: Medical issues which prohibited therapy  Reintubated yesterday. Will hold for now and see how pt is doing tomorrow if ready for PT reassessment. Please secure chat myself if urgent re-evaluation needed.  Thanks,  Leontine Roads, PT, DPT [facility]  Rehabilitation Services Physical Therapist Office: [phone] Website: [email or web address]   Leontine GORMAN Roads 03/25/2024, 9:49 AM

## 2024-03-25 NOTE — Progress Notes (Signed)
 NAME:  Alan [name], MRN:  [id number], DOB:  Jan 10, 1956, LOS: 15 ADMISSION DATE:  03/10/2024, CONSULTATION DATE:  8/4 REFERRING MD:  [email or web address], CHIEF COMPLAINT:  post-op cardiac surgery    History of Present Illness:  68 year old male w/ h/o HTN, CAD, prior DES x 3 to LAD, minimally invasive MVR (2015), SVT s/p ablation,  metastatic NSCLC w/ mets to bone (in remission).  Admitted 7/28 w/ chest pain. Initially started 7/26 went to ER and CEs and EKG neg so went home but CP persisted so returned.  In ER on 28th + trop I, new T wave inversion in anterior lateral leads.  Treated w/ supplemental oxygen, morphine , started on heparin  gtt.  Went to cath lab  Findings:  99% -80% in-stent restenosis of LAD balloon angioplasty completed w/ partial restoration of flow (this re-stenosed stent was felt culprit lesion). RCA 45 to 70% stenosed, cx was 40%. Had nml LVEDP, no AS, referred to CVTS for CABG given 3V disease. and placed on plavix . EF from ECHO 30-35% w/ left regional wall motion abnormality.   Went to OR 8/4 for planned CABG x 3, as well as repair of ascending aortic dissection w/ vascular graft and repair of aortic valve  Time on bypass: ~ 4h22min EBL 1390 Cell saver 910 Received: FFP X2, PLTs 1 unit PRBC and cryo for on going surgical oozing Arrived in ICU w 480 ml bloody outpt from Chest tube Surgical team at bedside on arrival  Shortly after arrival to ICU had runs of VT and AF w/ RVR. Amiodarone  bolus and gtt initiated.  Got 2 gms calcium  another 2 units PLTs. TXA  Pertinent  Medical History  Prior CAD w/ DES X 2 and prior minimally invasive MVR 2015, NSCLC q/ mets to bone (2023) s/p chemo and XRT w/ most recent scans showing stable non-active disease  NSTEMI, HLD Significant Hospital Events: Including procedures, antibiotic start and stop dates in addition to other pertinent events   7/28 admitted w/ CP and acute inf/lat MI, cardiac cath svr 3V disease w/ re-stenosed LAD stent and  incomplete restoration of coronary flow. EF 30-35% referred for CABG 8/4 to OR. CABG x 3 v but as completing CABG noted blood loss and intra-op ECHO showed dissection of Aorta. So underwent AV graft placement and AVR. Large EBL in OR received several blood products. Immediate post op w/ large vol CT output, runs of VT and SVT. Got 2gms calcium , amiodarone  and placed on overdrive pacing via pacer critical care at bedside assisting w/ support  8/5 myoclonus; normal head CT& CTA, EEG started. Loaded with keppra .  8/6 remained on EEG, weaned off propofol  8/7 woke up, followed commands 8/8 started pneumonia antibiotics 8/9 failed SBT; milrinone  discontinued 8/10 extubated 8/11 required reintubation, bronch  Interim History / Subjective:  NAEON. On Propofol . Coox 68, CVP 6.  Objective    Blood pressure (!) 104/56, pulse 87, temperature 99.7 F (37.6 C), resp. rate 20, height 6' 4 (1.93 m), weight 76.8 kg, SpO2 98%. CVP:  [3 mmHg-28 mmHg] 6 mmHg  Vent Mode: PRVC FiO2 (%):  [40 %-100 %] 40 % Set Rate:  [20 bmp] 20 bmp Vt Set:  [379 mL] 620 mL PEEP:  [5 cmH20-12 cmH20] 5 cmH20 Plateau Pressure:  [17 cmH20-28 cmH20] 17 cmH20   Intake/Output Summary (Last 24 hours) at 03/25/2024 0853 Last data filed at 03/25/2024 0700 Gross per 24 hour  Intake 4097.34 ml  Output 3330 ml  Net 767.34 ml  Filed Weights   03/23/24 0500 03/24/24 0500 03/25/24 0500  Weight: 86 kg 73.7 kg 76.8 kg    Examination: General: Adult male, critically ill, in NAD. Neuro: Sedated, minimally responsive. HEENT: [place]/AT. Sclerae anicteric. ETT in place. Cardiovascular: RRR, chest tube in place with stable output, no M/R/G.  Lungs: Respirations even and unlabored.  CTA bilaterally, No W/R/R. Abdomen: BS x 4, soft, NT/ND.  Musculoskeletal: No gross deformities, no edema.   Resolved problem list  Post operative acute blood loss w/ consumptive coagulopathy & thrombocytopenia following prolonged CPB time  right  hemothorax> resolved Assessment and Plan   3V CAD and restenosis of DES now s/p CABG x 3 V w/ intraoperative finding of ascending aorta dissection requiring aortic graft and AVR - Post op care per TCTS - Chest tube per TCTS - ASA, statin  Acute on chronic HFrEF due to ICM with cardiogenic shock. H/o HLD, HLD and CAD w/ severe mitral valve disease. Prior stents and mini- mitral valve repair (2015) for severe MR. Post-op VT; not recurrent Prolonged Qtc- on 8/9 - GDMT, inotropes, diuresis per AHF - Continue Amio, ASA, Statin  Acute hypoxic respiratory failure with H.flu PNA - failed extubation 8/10, reintubated 8/11. Bilateral PTX- stable. Has Y chest tube with L pleural in place - long discussion with Dr. 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 Last PET w/ no active disease- in remission since 2023. Possible new sclerotic lesion on CXR with small LUL nodule - Needs re-staging imaging prior to any consideration of trach - Will ask Palliative care to see as should help with family decisions regarding best course  Embolic strokes- small, query low flow vs embolic postop Acute metabolic encephalopathy with critical illness myopathy- big issue here Myoclonic seizures due to strokes- resolved - Continue supportive care - Continue Keppra  - Neurology was following, signed off 8/11  R groin AVF - iatrogenic? - VVS following, appreciate the assistance - LE duplex neg, R groin US  with small hematoma and fistula - Conservative management per VVS  Reactive/ABLA associated thrombocytopenia- improved - Transfuse for Hgb < 7 - Follow CBC  Hyperglycemia - SSI,  Semglee   Mild hypernatremia Hypokalemia - Diuresis per AHF - Replete lytes - Follow BMP  Malnutrition - TF's   Best Practice (right click and Reselect all SmartList Selections daily)   Diet/type: tubefeeds DVT prophylaxis SCD Pressure ulcer(s): N/A GI prophylaxis: PPI Lines: Arterial Line and yes and it is still needed Foley:  Yes, and it is still needed Code Status:  full code Last date of multidisciplinary goals of care discussion: wife updated by Dr. Claudene 8/11    CC time: 35 min.   Sammi Gore, PA - [facility] For pager details, please see AMION or use Epic chat  After 1900, please call [facility] for cross coverage needs 03/25/2024, 9:23 AM

## 2024-03-25 NOTE — Progress Notes (Addendum)
 T[facility] DAILY ICU PROGRESS NOTE                   [address]            [place] [address]          [phone]   8 Days Post-Op Procedure(s) (LRB): CORONARY ARTERY BYPASS GRAFTING TIMES THREE , USING LEFT INTERNAL MAMMARY ARTERY AND ENDOSCOPIC HARVESTED RIGHT SAPHENOUS VEIN (N/A) ECHOCARDIOGRAM, TRANSESOPHAGEAL, INTRAOPERATIVE (N/A) REPAIR, AORTIC DISSECTION, ASCENDING USING 30 MM HEMASHIELD PLATINUM VASCULAR GRAFT RESUSPENSION OF AORTIC VALVE  Total Length of Stay:  LOS: 15 days   Subjective: Sedated on vent  Objective: Vital signs in last 24 hours: Temp:  [99.3 F (37.4 C)-101.7 F (38.7 C)] 99.7 F (37.6 C) (08/12 0700) Pulse Rate:  [79-116] 87 (08/12 0700) Cardiac Rhythm: Normal sinus rhythm (08/12 0400) Resp:  [17-34] 20 (08/12 0700) BP: (95-166)/(40-61) 104/56 (08/12 0700) SpO2:  [79 %-100 %] 98 % (08/12 0700) Arterial Line BP: (87-192)/(23-93) 122/69 (08/12 0700) FiO2 (%):  [40 %-100 %] 40 % (08/12 0400) Weight:  [76.8 kg] 76.8 kg (08/12 0500)  Filed Weights   03/23/24 0500 03/24/24 0500 03/25/24 0500  Weight: 86 kg 73.7 kg 76.8 kg    Weight change: 3.1 kg   Hemodynamic parameters for last 24 hours: CVP:  [3 mmHg-28 mmHg] 6 mmHg  Intake/Output from previous day: 08/11 0701 - 08/12 0700 In: 4097.3 [I.V.:497.2; NG/GT:2130.3; IV Piggyback:1469.9] Out: 3625 [Urine:3285; Chest Tube:340]  Intake/Output this shift: No intake/output data recorded.  Current Meds: Scheduled Meds:  acetaminophen   650 mg Per Tube Q6H   amiodarone   200 mg Per Tube Daily   aspirin  EC  325 mg Oral Daily   Or   aspirin   324 mg Per Tube Daily   atorvastatin   80 mg Per Tube Daily   bisacodyl   10 mg Oral Daily   Or   bisacodyl   10 mg Rectal Daily   Chlorhexidine  Gluconate Cloth  6 each Topical Daily   docusate  100 mg Per Tube BID   enoxaparin  (LOVENOX ) injection  40 mg Subcutaneous Q24H   feeding supplement (PROSource TF20)  60 mL Per Tube TID   free water   200  mL Per Tube Q4H   insulin  aspart  0-24 Units Subcutaneous Q4H   insulin  glargine-yfgn  15 Units Subcutaneous Daily   levETIRAcetam   1,000 mg Intravenous Q12H   mouth rinse  15 mL Mouth Rinse Q2H   polyethylene glycol  17 g Per Tube Daily   potassium chloride   20 mEq Per Tube Q4H   sodium chloride  flush  10-40 mL Intracatheter Q12H   sodium chloride  flush  3 mL Intravenous Q12H   sodium chloride  HYPERTONIC  4 mL Nebulization Q4H   Continuous Infusions:  albumin  human Stopped (03/18/24 0335)   aztreonam  Stopped (03/25/24 0554)   famotidine  (PEPCID ) IV Stopped (03/24/24 2154)   feeding supplement (VITAL 1.5 CAL) 60 mL/hr at 03/25/24 0700   fentaNYL  infusion INTRAVENOUS 50 mcg/hr (03/25/24 0700)   norepinephrine  (LEVOPHED ) Adult infusion Stopped (03/25/24 0202)   propofol  (DIPRIVAN ) infusion 10 mcg/kg/min (03/25/24 0700)   vancomycin  Stopped (03/25/24 0019)   PRN Meds:.albumin  human, fentaNYL , hydrALAZINE , ipratropium-albuterol , midazolam , ondansetron  (ZOFRAN ) IV, mouth rinse, oxyCODONE , sodium chloride  flush  General appearance: appears ill Neurologic: sedated on vent Heart: regular rate and rhythm Lungs: fairly clear anteriorly, dim in right lower fields Abdomen: soft, non distended Extremities: no edema Wound: incis ok Some mottling in toes  Lab Results: CBC:  Recent Labs    03/24/24 0431 03/24/24 0749 03/24/24 1039 03/25/24 0430  WBC 13.1*  --   --  8.1  HGB 9.4*   < > 8.8* 7.6*  HCT 30.2*   < > 26.0* 24.5*  PLT 88*  --   --  76*   < > = values in this interval not displayed.   BMET:  Recent Labs    03/24/24 0431 03/24/24 0749 03/24/24 1039 03/25/24 0430  NA 148*   < > 148* 147*  K 3.5   < > 4.0 3.4*  CL 109  --   --  110  CO2 28  --   --  28  GLUCOSE 120*  --   --  132*  BUN 44*  --   --  55*  CREATININE 0.79  --   --  0.79  CALCIUM  7.5*  --   --  7.8*   < > = values in this interval not displayed.    CMET: Lab Results  Component Value Date   WBC 8.1  03/25/2024   HGB 7.6 (L) 03/25/2024   HCT 24.5 (L) 03/25/2024   PLT 76 (L) 03/25/2024   GLUCOSE 132 (H) 03/25/2024   CHOL 120 03/12/2024   TRIG 230 (H) 03/25/2024   HDL 40 (L) 03/12/2024   LDLCALC 58 03/12/2024   ALT 361 (H) 03/19/2024   AST 243 (H) 03/19/2024   NA 147 (H) 03/25/2024   K 3.4 (L) 03/25/2024   CL 110 03/25/2024   CREATININE 0.79 03/25/2024   BUN 55 (H) 03/25/2024   CO2 28 03/25/2024   TSH 1.928 08/19/2020   INR 1.5 (H) 03/19/2024   HGBA1C 5.0 03/12/2024      PT/INR: No results for input(s): LABPROT, INR in the last 72 hours. Radiology: VAS US  LOWER EXTREMITY VENOUS (DVT) Result Date: 03/24/2024  Lower Venous DVT Study Patient Name:  Alan [name]  Date of Exam:   03/24/2024 Medical Rec #: [id number]         Accession #:    [id number] Date of Birth: 1956/04/07          Patient Gender: M Patient Age:   68 years Exam Location:  [facility] Procedure:      VAS US  LOWER EXTREMITY VENOUS (DVT) Referring Phys: TORIBIO SHARPS --------------------------------------------------------------------------------  Indications: Edema. Other Indications: Concern for left lower extremity ischemia - duskiness on the                    left foot. Comparison Study: No priors. Performing Technologist: Ricka Sturdivant-Jones RDMS, RVT  Examination Guidelines: A complete evaluation includes B-mode imaging, spectral Doppler, color Doppler, and power Doppler as needed of all accessible portions of each vessel. Bilateral testing is considered an integral part of a complete examination. Limited examinations for reoccurring indications may be performed as noted. The reflux portion of the exam is performed with the patient in reverse Trendelenburg.  +---------+---------------+---------+-----------+----------+--------------+ RIGHT    CompressibilityPhasicitySpontaneityPropertiesThrombus Aging +---------+---------------+---------+-----------+----------+--------------+ CFV      Full            No       No                                  +---------+---------------+---------+-----------+----------+--------------+ SFJ      Full                                                        +---------+---------------+---------+-----------+----------+--------------+  FV Prox  Full                                                        +---------+---------------+---------+-----------+----------+--------------+ FV Mid   Full                                                        +---------+---------------+---------+-----------+----------+--------------+ FV DistalFull                                                        +---------+---------------+---------+-----------+----------+--------------+ PFV      Full                                                        +---------+---------------+---------+-----------+----------+--------------+ POP      Full           Yes      Yes                                 +---------+---------------+---------+-----------+----------+--------------+ PTV      Full                                                        +---------+---------------+---------+-----------+----------+--------------+ PERO     Full                                                        +---------+---------------+---------+-----------+----------+--------------+   +---------+---------------+---------+-----------+----------+--------------+ LEFT     CompressibilityPhasicitySpontaneityPropertiesThrombus Aging +---------+---------------+---------+-----------+----------+--------------+ CFV      Full           Yes      Yes                                 +---------+---------------+---------+-----------+----------+--------------+ SFJ      Full                                                        +---------+---------------+---------+-----------+----------+--------------+ FV Prox  Full                                                         +---------+---------------+---------+-----------+----------+--------------+  FV Mid   Full                                                        +---------+---------------+---------+-----------+----------+--------------+ FV DistalFull                                                        +---------+---------------+---------+-----------+----------+--------------+ PFV      Full                                                        +---------+---------------+---------+-----------+----------+--------------+ POP      Full           Yes      Yes                                 +---------+---------------+---------+-----------+----------+--------------+ PTV      Full                                                        +---------+---------------+---------+-----------+----------+--------------+ PERO     Full                                                        +---------+---------------+---------+-----------+----------+--------------+ Patent left PTA, PERO and dampened DPA flow.    Summary: BILATERAL: - No evidence of deep vein thrombosis seen in the lower extremities, bilaterally. -No evidence of popliteal cyst, bilaterally.   *See table(s) above for measurements and observations. Electronically signed by Fonda Rim on 03/24/2024 at 6:55:36 PM.    Final    VAS US  GROIN PSEUDOANEURYSM Result Date: 03/24/2024  ARTERIAL PSEUDOANEURYSM  Patient Name:  Alan [name]  Date of Exam:   03/24/2024 Medical Rec #: [id number]         Accession #:    [id number] Date of Birth: Jan 16, 1956          Patient Gender: M Patient Age:   89 years Exam Location:  [facility] Procedure:      VAS US  QUILLIAN CAPES Referring Phys: FONDA RIM --------------------------------------------------------------------------------  Exam: Right groin Indications: Patient complains of bruising. History: Postop day 6 CABG X 3 using right saphenous vein complicated by dissection. Performing  Technologist: Ricka Sturdivant-Jones RDMS, RVT  Examination Guidelines: A complete evaluation includes B-mode imaging, spectral Doppler, color Doppler, and power Doppler as needed of all accessible portions of each vessel. Bilateral testing is considered an integral part of a complete examination. Limited examinations for reoccurring indications may be performed as noted. +------------+----------+---------+------+------------------------+ Right DuplexPSV (cm/s)Waveform Plaque       Comment(s)        +------------+----------+---------+------+------------------------+  CFA            182                   Abnormal turbulence flow +------------+----------+---------+------+------------------------+ PFA             45    triphasic                               +------------+----------+---------+------+------------------------+ Prox SFA        66    triphasic                               +------------+----------+---------+------+------------------------+  Findings: There is evidence of an abnormal flow channel between the distal common femoral artey and the distal common femoral vein. Doppler characteristics display turbulence and bidirectional flow at distal common femoral vein. The Distal common femoral artey displays early systolic flow reversal distal to the fistula.  Summary: Small groin hematoma was noted.  Diagnosing physician: Fonda Rim Electronically signed by Fonda Rim on 03/24/2024 at 6:53:54 PM.    --------------------------------------------------------------------------------    Final    DG Chest 1V REPEAT Same Day Result Date: 03/24/2024 CLINICAL DATA:  [id number] Pneumothorax [id number] EXAM: CHEST - 1 VIEW SAME DAY COMPARISON:  March 24, 2024, 12:17 p.m. FINDINGS: Endotracheal tube is similarly positioned terminating in the mid trachea. Weighted feeding tube courses below the diaphragm terminating in the gastric antrum. Left PICC terminates in the lower SVC. Small bilateral  pleural effusions with unchanged bibasilar airspace disease. Overall, unchanged right apical pneumothorax, measuring 1.8 cm. The left apical pneumothorax is slightly smaller measuring 9 mm (previously 1.2 cm). Mild cardiomegaly. Sternotomy wires and CABG markers. Mitral valve replacement. Tortuous aorta with aortic atherosclerosis. IMPRESSION: 1. Similar right apical pneumothorax with slightly smaller left apical pneumothorax, as delineated above. Otherwise, little significant interval change to the lung findings. 2. Similar positioning of the support tubes and lines, as described above. Electronically Signed   By: Rogelia Myers M.D.   On: 03/24/2024 17:30   DG Chest 1V REPEAT Same Day Result Date: 03/24/2024 CLINICAL DATA:  Pneumothorax EXAM: CHEST - 1 VIEW SAME DAY COMPARISON:  Chest radiograph March 24, 2024, 9:28 a.m. FINDINGS: Grossly stable biapical pneumothorax right greater than left, inter pleural distance of 1.9 cm on the right and 1.2 cm on the left. Increased perihilar and bilateral lower lobe pulmonary patchy opacities. Left chest tube remains in place in mid hemi thorax. Cardiomediastinal silhouette is stable. Endotracheal tube terminates 3.3 cm to carina. Left upper extremity PICC terminates in cavoatrial junction. Enteric tube in place tip extending outside the field of view. Status post mitral valve ring annuloplasty. Status post CABG. No acute osseous abnormality. IMPRESSION: Stable to mildly increased  bilateral lung base patchy opacities. Stable bilateral pneumothoraces. Electronically Signed   By: Megan  Zare M.D.   On: 03/24/2024 13:37   DG Chest Port 1 View Result Date: 03/24/2024 CLINICAL DATA:  Follow up pneumothorax.  Intubated patient. EXAM: PORTABLE CHEST 1 VIEW COMPARISON:  Radiographs 03/23/2024 and 03/22/2024.  CT 12/26/2023. FINDINGS: 0928 hours. Tip of the endotracheal tube is unchanged, approximately 5.8 cm above the carina. A feeding tube projects below the diaphragm, tip  not visualized. Left arm PICC projects to the lower SVC level. Left chest tube remains in place. The heart size and mediastinal contours are stable post median sternotomy and CABG. Unchanged small  left apical pneumothorax. New small right apical pneumothorax without tension component. Unchanged patchy airspace opacities at both lung bases, right greater than left. IMPRESSION: 1. New small right apical pneumothorax without tension component. 2. Unchanged small left apical pneumothorax with left chest tube in place. 3. Stable bibasilar airspace opacities, right greater than left. Electronically Signed   By: Elsie Perone M.D.   On: 03/24/2024 10:24     Assessment/Plan: S/P Procedure(s) (LRB): CORONARY ARTERY BYPASS GRAFTING TIMES THREE , USING LEFT INTERNAL MAMMARY ARTERY AND ENDOSCOPIC HARVESTED RIGHT SAPHENOUS VEIN (N/A) ECHOCARDIOGRAM, TRANSESOPHAGEAL, INTRAOPERATIVE (N/A) REPAIR, AORTIC DISSECTION, ASCENDING USING 30 MM HEMASHIELD PLATINUM VASCULAR GRAFT RESUSPENSION OF AORTIC VALVE POD#8  1 Tmax 99.7, S BP 90's-160's, sinus rhythm, Levo -off, gtts- propofol  2 back on vent yesterday- PRVC,  management per CCM 3 vascular- AV fistula right groin- VVS following- conservative management currently 4 neuro- following exam - weakness primary issue, delirium, small embolic strokes, current sedated on vent 6 pneumonia- conts current ABX- vanc and aztreoanam, leukocytosis resolved 6 BUN rising,minor hypernatremia, creat in normal range, may be getting dry, AHF managing diuretics, K+ being replaced- free water  ordered 7 H/H slow drop, not at transfusion threshold, multifactorial- cont to follow closely 8 thrombocytopenia- 77K today- cont to monitor closely- lovenox  started fro DVT ppx 9 conts TF's for nutrition   Lemond FORBES Cera PA-C Pager [phone] 03/25/2024 7:40 AM  Patient seen and examined, agree with above D/w AHF and CCM on rounds  Fitzgerald Dunne C. Kerrin, MD [facility] [phone]

## 2024-03-25 NOTE — Progress Notes (Signed)
 PT Cancellation Note  Patient Details Name: MALAQUIAS LENKER MRN: [id number] DOB: 1956/02/16   Cancelled Treatment:    Reason Eval/Treat Not Completed: Medical issues which prohibited therapy  Reintubated yesterday. Will hold for now and see how pt is doing tomorrow if ready for PT reassessment. Please secure chat myself if urgent re-evaluation needed.  Thanks,  Leontine Roads, PT, DPT [facility] Physical Therapist Office: [phone] Website: [email or web address]   Leontine GORMAN Roads 03/25/2024, 9:49 AM

## 2024-03-25 NOTE — Progress Notes (Signed)
 PCCM Brief Note  Pleural POCUS performed, moderate right effusion. Also known R apical PTX.     Discussed with spouse, Suzi Rains and suggested pigtail chest tube placement for drainage of both effusion and PTX. Risk and benefits explained. Suzi gives us  permission to proceed.   Of note, she also requests that all significant updates/information only be shared with her and not with additional family members as there seems to be some confusion amongst them which could lead to disagreements etc. She would not like to limit any visitation or general updates by family members via phone, but has asked for specific details/updates to not be shared to other family. Will notify RN to share with night shift as well and also to relay to primary team.    Sammi Gore, PA - C [facility] For pager details, please see AMION or use Epic chat  After 1900, please call [facility] for cross coverage needs 03/25/2024, 9:56 AM

## 2024-03-25 NOTE — Procedures (Signed)
 Insertion of Chest Tube Procedure Note  Alan [name]  [id number]  1955/10/08  Date:03/25/24  Time:10:27 AM    Provider Performing: Sammi Gore   Procedure: Chest Tube Insertion (32551)  Indication(s) Effusion + Pneumothorax  Consent Risks of the procedure as well as the alternatives and risks of each were explained to the patient and/or caregiver.  Consent for the procedure was obtained and is signed in the bedside chart  Anesthesia Topical only with 1% lidocaine     Time Out Verified patient identification, verified procedure, site/side was marked, verified correct patient position, special equipment/implants available, medications/allergies/relevant history reviewed, required imaging and test results available.   Sterile Technique Maximal sterile technique including full sterile barrier drape, hand hygiene, sterile gown, sterile gloves, mask, hair covering, sterile ultrasound probe cover (if used).   Procedure Description Ultrasound used to identify appropriate pleural anatomy for placement and overlying skin marked. Area of placement cleaned and draped in sterile fashion.  A 14 French pigtail pleural catheter was placed into the right pleural space using Seldinger technique. Appropriate return of blood tinged fluid was obtained.  The tube was connected to atrium and placed on -20 cm H2O wall suction.   Complications/Tolerance None; patient tolerated the procedure well. Chest X-ray is ordered to verify placement.   EBL Minimal  Specimen(s) fluid   Sammi Gore, PA - C [facility] For pager details, please see AMION or use Epic chat  After 1900, please call [facility] for cross coverage needs 03/25/2024, 10:28 AM

## 2024-03-25 NOTE — Progress Notes (Signed)
 OT Cancellation Note  Patient Details Name: Alan [name] MRN: [id number] DOB: [birth year]   Cancelled Treatment:    Reason Eval/Treat Not Completed: Patient not medically ready (new chest tube sedated intubated)  Ely Molt 03/25/2024, 10:22 AM

## 2024-03-26 ENCOUNTER — Inpatient Hospital Stay (HOSPITAL_COMMUNITY)

## 2024-03-26 ENCOUNTER — Encounter (HOSPITAL_COMMUNITY)

## 2024-03-26 DIAGNOSIS — D696 Thrombocytopenia, unspecified: Secondary | ICD-10-CM

## 2024-03-26 DIAGNOSIS — Z7189 Other specified counseling: Secondary | ICD-10-CM | POA: Diagnosis not present

## 2024-03-26 DIAGNOSIS — I214 Non-ST elevation (NSTEMI) myocardial infarction: Secondary | ICD-10-CM | POA: Diagnosis not present

## 2024-03-26 DIAGNOSIS — E870 Hyperosmolality and hypernatremia: Secondary | ICD-10-CM | POA: Diagnosis not present

## 2024-03-26 DIAGNOSIS — R509 Fever, unspecified: Secondary | ICD-10-CM | POA: Diagnosis not present

## 2024-03-26 DIAGNOSIS — R569 Unspecified convulsions: Secondary | ICD-10-CM | POA: Diagnosis not present

## 2024-03-26 DIAGNOSIS — I5021 Acute systolic (congestive) heart failure: Secondary | ICD-10-CM | POA: Diagnosis not present

## 2024-03-26 DIAGNOSIS — Z515 Encounter for palliative care: Secondary | ICD-10-CM | POA: Diagnosis not present

## 2024-03-26 DIAGNOSIS — J9601 Acute respiratory failure with hypoxia: Secondary | ICD-10-CM | POA: Diagnosis not present

## 2024-03-26 DIAGNOSIS — J939 Pneumothorax, unspecified: Secondary | ICD-10-CM | POA: Diagnosis not present

## 2024-03-26 DIAGNOSIS — R4182 Altered mental status, unspecified: Secondary | ICD-10-CM

## 2024-03-26 LAB — BASIC METABOLIC PANEL WITH GFR
Anion gap: 9 (ref 5–15)
Anion gap: 9 (ref 5–15)
BUN: 54 mg/dL — ABNORMAL HIGH (ref 8–23)
BUN: 50 mg/dL — ABNORMAL HIGH (ref 8–23)
CO2: 26 mmol/L (ref 22–32)
CO2: 26 mmol/L (ref 22–32)
Calcium: 7.9 mg/dL — ABNORMAL LOW (ref 8.9–10.3)
Calcium: 7.8 mg/dL — ABNORMAL LOW (ref 8.9–10.3)
Chloride: 116 mmol/L — ABNORMAL HIGH (ref 98–111)
Chloride: 116 mmol/L — ABNORMAL HIGH (ref 98–111)
Creatinine, Ser: 0.74 mg/dL (ref 0.61–1.24)
Creatinine, Ser: 0.68 mg/dL (ref 0.61–1.24)
GFR, Estimated: >60 mL/min (ref >60)
GFR, Estimated: >60 mL/min (ref >60)
Glucose, Bld: 130 mg/dL — ABNORMAL HIGH (ref 70–99)
Glucose, Bld: 146 mg/dL — ABNORMAL HIGH (ref 70–99)
Potassium: 3.4 mmol/L — ABNORMAL LOW (ref 3.5–5.1)
Potassium: 4.1 mmol/L (ref 3.5–5.1)
Sodium: 151 mmol/L — ABNORMAL HIGH (ref 135–145)
Sodium: 151 mmol/L — ABNORMAL HIGH (ref 135–145)

## 2024-03-26 LAB — COOXEMETRY PANEL
Carboxyhemoglobin: 2 % — ABNORMAL HIGH (ref 0.5–1.5)
Methemoglobin: 1.1 % (ref 0.0–1.5)
O2 Saturation: 68.1 %
Total hemoglobin: 7.9 g/dL — ABNORMAL LOW (ref 12.0–16.0)

## 2024-03-26 LAB — GLUCOSE, CAPILLARY
Glucose-Capillary: 98 mg/dL (ref 70–99)
Glucose-Capillary: 103 mg/dL — ABNORMAL HIGH (ref 70–99)
Glucose-Capillary: 168 mg/dL — ABNORMAL HIGH (ref 70–99)
Glucose-Capillary: 114 mg/dL — ABNORMAL HIGH (ref 70–99)
Glucose-Capillary: 130 mg/dL — ABNORMAL HIGH (ref 70–99)
Glucose-Capillary: 126 mg/dL — ABNORMAL HIGH (ref 70–99)

## 2024-03-26 LAB — CBC
HCT: 25.7 % — ABNORMAL LOW (ref 39.0–52.0)
Hemoglobin: 7.9 g/dL — ABNORMAL LOW (ref 13.0–17.0)
MCH: 30.5 pg (ref 26.0–34.0)
MCHC: 30.7 g/dL (ref 30.0–36.0)
MCV: 99.2 fL (ref 80.0–100.0)
Platelets: 96 K/uL — ABNORMAL LOW (ref 150–400)
RBC: 2.59 MIL/uL — ABNORMAL LOW (ref 4.22–5.81)
RDW: 16.6 % — ABNORMAL HIGH (ref 11.5–15.5)
WBC: 11.9 K/uL — ABNORMAL HIGH (ref 4.0–10.5)
nRBC: 0.3 % — ABNORMAL HIGH (ref 0.0–0.2)

## 2024-03-26 LAB — CULTURE, RESPIRATORY W GRAM STAIN: Culture: NORMAL

## 2024-03-26 LAB — MAGNESIUM: Magnesium: 2.5 mg/dL — ABNORMAL HIGH (ref 1.7–2.4)

## 2024-03-26 LAB — TRIGLYCERIDES: Triglycerides: 73 mg/dL (ref <150)

## 2024-03-26 MED ORDER — LINEZOLID 600 MG/300ML IV SOLN
600.0000 mg | Freq: Two times a day (BID) | INTRAVENOUS | Status: DC
  Administered 2024-03-26 – 2024-03-29 (×9): 600 mg via INTRAVENOUS
  Filled 2024-03-26 (×9): qty 300

## 2024-03-26 MED ORDER — POTASSIUM CHLORIDE 20 MEQ PO PACK
40.0000 meq | PACK | ORAL | Status: AC
  Administered 2024-03-26 (×4): 40 meq
  Filled 2024-03-26 (×2): qty 2

## 2024-03-26 MED ORDER — METRONIDAZOLE 500 MG/100ML IV SOLN
500.0000 mg | Freq: Two times a day (BID) | INTRAVENOUS | Status: DC
  Administered 2024-03-26 – 2024-03-29 (×9): 500 mg via INTRAVENOUS
  Filled 2024-03-26 (×7): qty 100

## 2024-03-26 MED ORDER — METOLAZONE 5 MG PO TABS
5.0000 mg | ORAL_TABLET | Freq: Once | ORAL | Status: AC
  Administered 2024-03-26 (×2): 5 mg
  Filled 2024-03-26: qty 1

## 2024-03-26 MED ORDER — INSULIN GLARGINE-YFGN 100 UNIT/ML ~~LOC~~ SOLN
10.0000 [IU] | Freq: Every day | SUBCUTANEOUS | Status: DC
  Administered 2024-03-26 – 2024-03-30 (×6): 10 [IU] via SUBCUTANEOUS
  Filled 2024-03-26 (×6): qty 0.1

## 2024-03-26 MED ORDER — POTASSIUM CHLORIDE 20 MEQ PO PACK
20.0000 meq | PACK | ORAL | Status: DC
  Administered 2024-03-26 (×4): 20 meq
  Filled 2024-03-26 (×2): qty 1

## 2024-03-26 NOTE — Procedures (Signed)
 Patient Name: Alan [name]  MRN: [id number]  Epilepsy Attending: Arlin MALVA Krebs  Referring Physician/Provider: Claudene Toribio BROCKS, MD  Date: 03/26/2024 Duration: 22.34 mins  Patient history: 68yo M with ams. EEG to evaluate for seizure  Level of alertness:  comatose  AEDs during EEG study: LEV, propofol   Technical aspects: This EEG study was done with scalp electrodes positioned according to the 10-20 International system of electrode placement. Electrical activity was reviewed with band pass filter of 1-70Hz , sensitivity of 7 uV/mm, display speed of 58mm/sec with a 60Hz  notched filter applied as appropriate. EEG data were recorded continuously and digitally stored.  Video monitoring was available and reviewed as appropriate.  Description: EEG showed continuous generalized polymorphic low amplitude 3 to 6 Hz theta-delta slowing. Hyperventilation and photic stimulation were not performed.     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 No seizures or epileptiform discharges were seen throughout the recording.  Sufian Ravi O Arcangel Minion

## 2024-03-26 NOTE — Progress Notes (Signed)
 STAT Routine EEG complete. Results pending.

## 2024-03-26 NOTE — Progress Notes (Addendum)
 T[facility] DAILY ICU PROGRESS NOTE                   [address]            [place] [address]          [phone]   9 Days Post-Op Procedure(s) (LRB): CORONARY ARTERY BYPASS GRAFTING TIMES THREE , USING LEFT INTERNAL MAMMARY ARTERY AND ENDOSCOPIC HARVESTED RIGHT SAPHENOUS VEIN (N/A) ECHOCARDIOGRAM, TRANSESOPHAGEAL, INTRAOPERATIVE (N/A) REPAIR, AORTIC DISSECTION, ASCENDING USING 30 MM HEMASHIELD PLATINUM VASCULAR GRAFT RESUSPENSION OF AORTIC VALVE  Total Length of Stay:  LOS: 16 days   Subjective: Sedated on vent  Objective: Vital signs in last 24 hours: Temp:  [99.7 F (37.6 C)-101.1 F (38.4 C)] 100.9 F (38.3 C) (08/13 0700) Pulse Rate:  [82-99] 97 (08/13 0700) Cardiac Rhythm: Normal sinus rhythm (08/12 2100) Resp:  [18-24] 21 (08/13 0700) BP: (104-145)/(46-111) 139/61 (08/13 0700) SpO2:  [92 %-100 %] 96 % (08/13 0700) Arterial Line BP: (91-197)/(37-99) 197/49 (08/13 0700) FiO2 (%):  [40 %] 40 % (08/13 0330) Weight:  [75.7 kg] 75.7 kg (08/13 0432)  Filed Weights   03/24/24 0500 03/25/24 0500 03/26/24 0432  Weight: 73.7 kg 76.8 kg 75.7 kg    Weight change: -1.1 kg   Hemodynamic parameters for last 24 hours: CVP:  [5 mmHg-26 mmHg] 13 mmHg  Intake/Output from previous day: 08/12 0701 - 08/13 0700 In: 3683.6 [I.V.:316.5; NG/GT:2420; IV Piggyback:927.1] Out: 6005 [Urine:4555; Chest Tube:1450]  Intake/Output this shift: No intake/output data recorded.  Current Meds: Scheduled Meds:  acetaminophen   650 mg Per Tube Q6H   amiodarone   200 mg Per Tube Daily   aspirin  EC  325 mg Oral Daily   Or   aspirin   324 mg Per Tube Daily   atorvastatin   80 mg Per Tube Daily   bisacodyl   10 mg Oral Daily   Or   bisacodyl   10 mg Rectal Daily   Chlorhexidine  Gluconate Cloth  6 each Topical Daily   docusate  100 mg Per Tube BID   feeding supplement (PROSource TF20)  60 mL Per Tube TID   free water   200 mL Per Tube Q6H   furosemide   80 mg Intravenous BID    insulin  aspart  0-24 Units Subcutaneous Q4H   insulin  glargine-yfgn  15 Units Subcutaneous Daily   levETIRAcetam   1,000 mg Intravenous Q12H   mouth rinse  15 mL Mouth Rinse Q2H   polyethylene glycol  17 g Per Tube Daily   potassium chloride   20 mEq Per Tube Q4H   sodium chloride  flush  10 mL Intrapleural Q8H   sodium chloride  flush  10-40 mL Intracatheter Q12H   sodium chloride  flush  3 mL Intravenous Q12H   spironolactone   25 mg Per Tube Daily   Continuous Infusions:  albumin  human Stopped (03/18/24 0335)   aztreonam  Stopped (03/26/24 0544)   famotidine  (PEPCID ) IV Stopped (03/25/24 2203)   feeding supplement (VITAL 1.5 CAL) 60 mL/hr at 03/26/24 0700   fentaNYL  infusion INTRAVENOUS 25 mcg/hr (03/26/24 0700)   propofol  (DIPRIVAN ) infusion 5 mcg/kg/min (03/26/24 0700)   vancomycin  Stopped (03/25/24 2335)   PRN Meds:.albumin  human, fentaNYL , hydrALAZINE , ipratropium-albuterol , midazolam , ondansetron  (ZOFRAN ) IV, mouth rinse, oxyCODONE , sodium chloride  flush  General appearance: sedated Heart: regular rate and rhythm Lungs: mildly dim in bases Abdomen: soft, non tender Extremities: cool, toes w/ embolic changes Wound: healing ok  Lab Results: CBC: Recent Labs    03/25/24 1140 03/26/24 0420  WBC 9.7 11.9*  HGB 7.8* 7.9*  HCT 25.1* 25.7*  PLT 80* 96*   BMET:  Recent Labs    03/25/24 1140 03/26/24 0420  NA 151* 151*  K 3.7 3.4*  CL 113* 116*  CO2 28 26  GLUCOSE 134* 130*  BUN 54* 54*  CREATININE 0.82 0.74  CALCIUM  8.3* 7.9*    CMET: Lab Results  Component Value Date   WBC 11.9 (H) 03/26/2024   HGB 7.9 (L) 03/26/2024   HCT 25.7 (L) 03/26/2024   PLT 96 (L) 03/26/2024   GLUCOSE 130 (H) 03/26/2024   CHOL 120 03/12/2024   TRIG 73 03/26/2024   HDL 40 (L) 03/12/2024   LDLCALC 58 03/12/2024   ALT 361 (H) 03/19/2024   AST 243 (H) 03/19/2024   NA 151 (H) 03/26/2024   K 3.4 (L) 03/26/2024   CL 116 (H) 03/26/2024   CREATININE 0.74 03/26/2024   BUN 54 (H)  03/26/2024   CO2 26 03/26/2024   TSH 1.928 08/19/2020   INR 1.5 (H) 03/19/2024   HGBA1C 5.0 03/12/2024      PT/INR: No results for input(s): LABPROT, INR in the last 72 hours. Radiology: [facility] Chest Port 1 View Result Date: 03/25/2024 CLINICAL DATA:  Chest tube placement.  Bilateral pneumothoraces. EXAM: PORTABLE CHEST 1 VIEW COMPARISON:  Radiographs 03/25/2024, 03/24/2024 and 03/23/2024. FINDINGS: 1045 hours. Two views submitted. New small caliber pigtail right pleural drainage catheter projects over the right mid lung. The endotracheal tube, feeding tube, left arm PICC and left chest tube appear unchanged. The heart size and mediastinal contours are stable post CABG. Small right apical pneumothorax appears slightly improved. Small left apical pneumothorax appears unchanged. There is improved aeration of both lung bases with decreased right basilar airspace disease and a probable decreasing right pleural effusion. No acute osseous findings. IMPRESSION: 1. Interval placement of a small caliber right pleural drainage catheter with slight improvement in small right apical pneumothorax. 2. Stable small left apical pneumothorax. 3. Improved aeration of both lung bases with decreased right pleural effusion. Electronically Signed   By: Elsie Perone M.D.   On: 03/25/2024 11:51     Assessment/Plan: S/P Procedure(s) (LRB): CORONARY ARTERY BYPASS GRAFTING TIMES THREE , USING LEFT INTERNAL MAMMARY ARTERY AND ENDOSCOPIC HARVESTED RIGHT SAPHENOUS VEIN (N/A) ECHOCARDIOGRAM, TRANSESOPHAGEAL, INTRAOPERATIVE (N/A) REPAIR, AORTIC DISSECTION, ASCENDING USING 30 MM HEMASHIELD PLATINUM VASCULAR GRAFT RESUSPENSION OF AORTIC VALVE POD#9  1 Tmax 101.3, S BP 100-190's range, sinus rhythm, gtts- fentanyl  and propofol , no inotropes or vasopressors, Co-Ox 68.1, CVP 16- AHF managing  2 full vent support, PRVC- CCM management, oxygenating ok 3 excellent UOP >4 liters 4 CBG's well controlled 5 on TF's for nutrition,  Vital 1.5, free water  6 rising hypernatremia- sodium now 151, hyperchloremia Chloride - 116- non acidotic 7 hypokalemia- K+ 3.4- replace 8 leukocytosis, WBC 11.9, conts current abx, poss remove lines 9 thrombocytopenia- trend improved, PLT 96 k 10 CXR- small right apical pntx, R basilar ASD pretty stable, tiny left apical pntx, minor ASD left base 11 VVS following right groin AVF 12 neuro following for embolic CVA's 13 BUN stable elevation, creat normal- likely water  loss- AHF has been managing diuretics 14 H/H fairly stable, not at transfusion threshold, 7.9/25.7 15 critically ill , cont full support for now, may need Trach, palliative has talked to family who are not sure they will want that    Lemond FORBES Cera PA-C Pager [phone] 03/26/2024 8:03 AM  Patient seen and examined with Mr. Cera and AHF and CCM Persistent  fever- will adjust antibiotic coverage Right CT- 1300 ml serous fluid Will eventually need trach if we are going to continue aggressive care  Elspeth C. Kerrin, MD [facility] [phone]

## 2024-03-26 NOTE — Progress Notes (Signed)
 Nutrition Follow-up  DOCUMENTATION CODES:   Non-severe (moderate) malnutrition in context of chronic illness (decreased appetite related to worsening heart issues)  INTERVENTION:   Tube Feeding via Cortrak:  Vital 1.5 to 60 ml/hr Pro-Source TF20 60 mL to TID TF at goal provides 2400 kcals, 157 g of protein and 1094 mL of free water    Small amount of additional calories provided via lipid in propofol  infusion   Continue free water , current flush of 200 mL q 6  hours--order per MD; provides additional 800 mL in 24 hours. Total free water  from TF plus scheduled flush 1894 mL  Recommend that pt discuss all outpatient vitamin/mineral supplement use, including use of Relief Factor and Xango Juice,  with MD prior to resuming use as outpatient.   NUTRITION DIAGNOSIS:   Moderate Malnutrition related to chronic illness (decreased appetite related to worsening heart issues) as evidenced by mild fat depletion, mild muscle depletion, energy intake < 75% for > or equal to 1 month.  Continues but being addressed via TF   GOAL:   Patient will meet greater than or equal to 90% of their needs  Being addressed via TF   MONITOR:   TF tolerance, Vent status, Weight trends  REASON FOR ASSESSMENT:   Consult Enteral/tube feeding initiation and management  ASSESSMENT:   68 yo male admitted with NSTEMI. PMH includes metastatic lung cancer (in remission), CAD, STEMI 2004, MVP, MVR, prior stents and mini - mitral valve repair (2015), HLD, HTN, GERD.  7/28 Admitted, Cath Lab, ECHO EF 30-35 %, RV mildly reduced 8/04 OR: CABG x 3, repair of ascending aortic dissection (intra-op discovery) with vascular graft and repair of aortic valve 8/05 Myoclonus, CT/CTA head normal, EEG started 8/07 Post-op Echo with EF improving, 40-45% with septal dyssynchrony 8/08 Cortrak placed 8/10 Extubated, very weak/decompensated 8/11 Re-intubated, Vascular surgery consulted for duskiness in LLE 8/12 R  Thoracentesis, US  R groin with evidence of AV fistula, atheroembolic event to both feet with mottling at toes-allowing for demarcation per Vascular Surgery  Pt remains sedated on vent support, Febrile.  Propofol : 3.5 ml/hr Stat EEG today, plan for repeat CT H/C/A/P. Noted palliative care now following; possible plan for trach if fails extubation again  Vital 1.5 at 60 ml/hr with Pro-Source TF20 TID per Cortrak, pt tolerating  Noted Free Water  flush of 200 mL decreased to q 6 hour frequency yesterday; hypernatremia persists. Sodium 151 this AM, BUN 54 with normal Creatinine. CL 116. Receiving lasix  but metolazone  added for worsening hypernatremia/hypochloridemia  UOP 4.6 L in 24 hours Current Wt: 75.7 kg, down 1 kg from yesterday but remains 2 kg up from 8/11 Admit Wt: 79 kg  R Posterior Pleural chest tube placed yesterday, over 1L out post placement. Total chest tube output 1450 mL in 24 hours  Labs: Sodium 151 Potassium 3.4 (L) Chloride 116 (H) BUN 54 (H) Creatinine 0.74 (wdl) Magnesium  2.5 (H) TG 73 (wdl)  Meds: Dulcolax daily Colace BID Miralax  daily Potassium Chloride  60 mEq per tube today Spirinolactone Lasix  80 mg IV BID Metazolone daily SS novolog  Semglee  10 units daily IV Flagyl   Home Meds include: Vitamin C  2000 mg daily Magnesium  30 mg daily CoQ10  Prostate MVI Relief Factor Supplement (contains turmeric, Omega 3s, Resveratrol, Icariin, Japanese [place], Vit D, Magnesium  and Zinc) Xango Juice (32 ounces daily: exceeds recommended dose, main ingredient Mangosteen)   Diet Order:   Diet Order     None       EDUCATION NEEDS:   Not  appropriate for education at this time  Skin:  Skin Assessment: Skin Integrity Issues: Skin Integrity Issues:: Stage I, Incisions, Other (Comment) Stage I: R heel Incisions: surgical incisions to chest, R leg, L groin Other: R wrist puncture site for cardiac cath  Last BM:  8/11 type 6/7 (noted FMS removed  yesterday)  Height:   Ht Readings from Last 1 Encounters:  03/21/24 6' 4 (1.93 m)    Weight:   Wt Readings from Last 1 Encounters:  03/26/24 75.7 kg    Ideal Body Weight:  91.8 kg  BMI:  Body mass index is 20.31 kg/m.  Estimated Nutritional Needs:   Kcal:  2300-2600 kcals  Protein:  140-170 g  Fluid:  2L   Betsey Finger MS, RDN, LDN, CNSC Registered Dietitian 3 Clinical Nutrition RD Inpatient Contact Info in Amion

## 2024-03-26 NOTE — Progress Notes (Addendum)
 Palliative Medicine Inpatient Follow Up Note HPI: Alan [name] is a 68 year old male with a past medical history of CAD (s/p STEMI with PCI and DES to LAD 2004) followed by Dr. Anner, HTN, HLD, mitral valve prolapse (s/p mini mitral valve repair utilizing a 34mm Sorin Memo 3D [place]ord ring annuloplasty by Dr. Dusty 2015), SVT (s/p ablation 2021), and metastatic lung cancer (s/p palliative radiotherapy, now stable). Has had a complicated hospitalization > 2 weeks with CABG x3, myclonic event, PNA - intubation x2. Palliative care has been ask to support additional goals of care conversations.   Today's Discussion 03/26/2024  *Please note that this is a verbal dictation therefore any spelling or grammatical errors are due to the Dragon Medical One system interpretation.  Chart reviewed inclusive of vital signs, progress notes, laboratory results, and diagnostic images.   I met with Toribio at bedside he is somnolent this afternoon.  I met with patients spouse, Suzi at bedside. Created space and opportunity for Suzi to explore thoughts feelings and fears regarding Billy's current medical situation. We reviewed that she is feeling more at peace today.   Suzi shares that she spoke with Dr. Claudene earlier in the day. She reviews the plan from this point forward is to extubate (likely tomorrow), should Tareq develop respiratory distress to pursue a tracheostomy. She shares the hope once extubated that he can work with PT/OT to regain strength. She also shares there are additional imaging studies which are going to be pursued to evaluate CA and if it has spread.  Suzi notes that she has not yet made any decision related to whether or not chest compressions would be desired. We reviewed it's something that she needs time to consider.   Suzi has reviewed [place] advanced directives and is agreeable to bringing in a copy.   Suzi shares a strong faith in God and reliance on her faith to bring her  through. Allowed her time and grace to express her beliefs. Provided support through therapeutic listening.   Questions and concerns addressed/Palliative Support Provided.   Objective Assessment: Vital Signs Vitals:   03/26/24 1330 03/26/24 1345  BP: (!) 130/56 128/65  Pulse: 97 97  Resp: (!) 21 (!) 24  Temp: 99.7 F (37.6 C) 99.3 F (37.4 C)  SpO2: 95% 95%    Intake/Output Summary (Last 24 hours) at 03/26/2024 1607 Last data filed at 03/26/2024 1510 Gross per 24 hour  Intake 3764.85 ml  Output 5890 ml  Net -2125.15 ml   Last Weight  Most recent update: 03/26/2024  4:32 AM    Weight  75.7 kg (166 lb 14.2 oz)            Gen: Elderly critically ill appearing Caucasian M HEENT: ETT, corte-track, dry mucous membranes CV: Regular rate and rhythm  PULM: On Mechanical ventilator ABD: soft/nontender  EXT: No edema  Neuro:  Somnolent  SUMMARY OF RECOMMENDATIONS   FULL CODE/FULL SCOPE OF CARE - Discussed with patients wife if chest compressions would be desire given patients h/o of chronic pain and the burden associated with compressions. She plans to talk about this more with her son.   Patients wife is open to tracheostomy if Doug fails extubation  Patients wife plans to bring in a copy of AD's in the oncoming days  Patient wife remains hopeful for improvement    Continue present measures at this time   Spiritual support is provided by patients church - [employer] [place]   PMT  will continue to follow along ______________________________________________________________________________________ Rosaline Esequ[facility] Palliative Medicine Team Team Cell Phone: [phone] Please utilize secure chat with additional questions, if there is no response within 30 minutes please call the above phone number  Billing based on MDM: High  Palliative Medicine Team providers are available by phone from 7am to 7pm daily and can be reached through the team cell phone.   Should this patient require assistance outside of these hours, please call the patient's attending physician.

## 2024-03-26 NOTE — Progress Notes (Signed)
   [address], [place] [address]             [phone]    Intubated, sedated  BP (!) 166/50   Pulse (!) 102   Temp 99.7 F (37.6 C)   Resp (!) 26   Ht 6' 4 (1.93 m)   Wt 75.7 kg   SpO2 93%   BMI 20.31 kg/m  BP now 133/66 CVP 10  Intake/Output Summary (Last 24 hours) at 03/26/2024 1717 Last data filed at 03/26/2024 1510 Gross per 24 hour  Intake 3697.65 ml  Output 5890 ml  Net -2192.35 ml   Na 151, K 4.1 BUN 50, creatinine 0.7  To attempt extubation tomorrow  Elspeth C. Kerrin, MD [facility] [phone]

## 2024-03-26 NOTE — Progress Notes (Signed)
 Advanced Heart Failure Rounding Note  Cardiologist: [name], MD   Chief Complaint: Post-op CABG and Bentall procedure   Subjective:   8/5 S/P CABG x3 8/11 - Reintubated. S/P Bronch. Septic picture. Vascular consulted. No DVT.  R AV fistula R groin, conservative  management.  8/12 S/P R Thoracentesis. Diuresed with IV lasix .    Remains intubated/febrile.    Objective:   Weight Range: 75.7 kg Body mass index is 20.31 kg/m.   Vital Signs:   Temp:  [99.5 F (37.5 C)-100.8 F (38.2 C)] 100.6 F (38.1 C) (08/13 0515) Pulse Rate:  [81-99] 87 (08/13 0515) Resp:  [18-24] 20 (08/13 0515) BP: (104-145)/(46-111) 120/56 (08/13 0515) SpO2:  [92 %-100 %] 97 % (08/13 0515) Arterial Line BP: (91-173)/(37-93) 172/47 (08/13 0515) FiO2 (%):  [40 %] 40 % (08/13 0330) Weight:  [75.7 kg] 75.7 kg (08/13 0432) Last BM Date : 03/24/24  Weight change: Filed Weights   03/24/24 0500 03/25/24 0500 03/26/24 0432  Weight: 73.7 kg 76.8 kg 75.7 kg    Intake/Output:   Intake/Output Summary (Last 24 hours) at 03/26/2024 0549 Last data filed at 03/26/2024 0520 Gross per 24 hour  Intake 3692.48 ml  Output 5800 ml  Net -2107.52 ml      Physical Exam  General: Intubated + Cortrak Neck: JVP 11-12  Cor: Regular rate & rhythm.  Lungs: Decreased . MT x1 CT x1 Abdomen: soft, nontender, nondistended.  Extremities: no  edema. R groin thrill. R and L foot cool  toes mottled Neuro: Intubated GU: foley  Telemetry   SR 80s   Labs    CBC Recent Labs    03/25/24 1140 03/26/24 0420  WBC 9.7 11.9*  HGB 7.8* 7.9*  HCT 25.1* 25.7*  MCV 97.7 99.2  PLT 80* 96*   Basic Metabolic Panel Recent Labs    91/87/74 0430 03/25/24 1140 03/26/24 0420  NA 147* 151* 151*  K 3.4* 3.7 3.4*  CL 110 113* 116*  CO2 28 28 26   GLUCOSE 132* 134* 130*  BUN 55* 54* 54*  CREATININE 0.79 0.82 0.74  CALCIUM  7.8* 8.3* 7.9*  MG 2.6*  --  2.5*     Medications:     Scheduled Medications:   acetaminophen   650 mg Per Tube Q6H   amiodarone   200 mg Per Tube Daily   aspirin  EC  325 mg Oral Daily   Or   aspirin   324 mg Per Tube Daily   atorvastatin   80 mg Per Tube Daily   bisacodyl   10 mg Oral Daily   Or   bisacodyl   10 mg Rectal Daily   Chlorhexidine  Gluconate Cloth  6 each Topical Daily   docusate  100 mg Per Tube BID   feeding supplement (PROSource TF20)  60 mL Per Tube TID   free water   200 mL Per Tube Q6H   furosemide   80 mg Intravenous BID   insulin  aspart  0-24 Units Subcutaneous Q4H   insulin  glargine-yfgn  15 Units Subcutaneous Daily   levETIRAcetam   1,000 mg Intravenous Q12H   mouth rinse  15 mL Mouth Rinse Q2H   polyethylene glycol  17 g Per Tube Daily   potassium chloride   20 mEq Per Tube Q4H   sodium chloride  flush  10 mL Intrapleural Q8H   sodium chloride  flush  10-40 mL Intracatheter Q12H   sodium chloride  flush  3 mL Intravenous Q12H   spironolactone   25 mg Per Tube Daily    Infusions:  albumin   human Stopped (03/18/24 0335)   aztreonam  2 g (03/26/24 9486)   famotidine  (PEPCID ) IV Stopped (03/25/24 2203)   feeding supplement (VITAL 1.5 CAL) 60 mL/hr at 03/26/24 0500   fentaNYL  infusion INTRAVENOUS 50 mcg/hr (03/26/24 0500)   norepinephrine  (LEVOPHED ) Adult infusion Stopped (03/25/24 0202)   propofol  (DIPRIVAN ) infusion 20 mcg/kg/min (03/26/24 0500)   vancomycin  Stopped (03/25/24 2335)    PRN Medications: albumin  human, fentaNYL , hydrALAZINE , ipratropium-albuterol , midazolam , ondansetron  (ZOFRAN ) IV, mouth rinse, oxyCODONE , sodium chloride  flush    Patient Profile  68 y.o. male with history of CAD, severe MR s/p minimally invasive mitral valve repair, stage III NSCLC.   Patient admitted with NSTEMI and new systolic CHF. Underwent 3v CABG X 3 c/b ascending aortic dissection.  Assessment/Plan   NSTEMI with known CAD -Remote anterior STEMI with prior stents to LAD -NSTEMI - 3 V CAD on cath with 99% in-stent restenosis in LAD, 50% residual  stenosis post PTCA -CABG X 3 (LIMA to LAD, SVG to OM, SBVG to PDA) 03/17/24 as above -Aspirin  + statin.   Ascending Aortic Dissection  - complication of CABG - s/p Bentall and resuspenison of AoV    Acute systolic CHF/ICM -Newly reduced EF -Echo 03/10/24: EF 30-35%, anterior WMA, RV mildly reduced -Post CABG limited echo 8/7: EF improving, 40-45% w/ septal dyssynchrony  - CO-OX 68%. CVP 12-13. Given 80 mg IV lasix  bid.    - Continue 25 mg spiro daily   Acute hypoxic resp failure/ Suspected PNA  - Tracheal aspirate with parainfluenzae + GPCs - 8/11 Reintubated.  -On vanc/azactam   Fever -Concern about sepsis. As above on vanc and azactam   -Blood cultures- NGTD    -Respiratory CX- Rare gram +  cocci  Severe MR s/p minimally invasive mitral valve repair -trivial MR with mean gradient of 4 mmhg on echo this admit    Postop anemia/thrombocytopenia -Multiple blood products in OR and immediately post-op -Hgb stable 7.9 today. Hold off on DVT prophylaxis.  -Platelets improving 96 K  -Has not been on heparin  post-op.   NSVT -NO recurrence.  -Continue po amio.     NSCLC w/bone mets -S/p chemoradiation and immunotherapy -Stable disease on most recent outpatinet imaging. Followed by Heme/Onc -Possible new sclerotic lesion/small LUL nodule on CXR this admission. Will eventually require CT.   Myoclonic seizures - Resolved. Suspect hypoxic/anoxic brain injury - Neurology following - Off sedation and following commands.  - Keppra  - MRI brain with multiple small infarcts   Left pneumothorax - per CT surgery keep left chest tube    Lower Extremities Mottling VVS consulted.  US  R groin -AV Fistula. No plan for intervention. At this time.   [facility] Palliative Care following.   CRITICAL CARE Performed by: Greig Mosses NP-C    Total critical care time:  15 minutes  Critical care time was exclusive of separately billable procedures and treating other patients.  Critical care was  necessary to treat or prevent imminent or life-threatening deterioration.  Critical care was time spent personally by me on the following activities: development of treatment plan with patient and/or surrogate as well as nursing, discussions with consultants, evaluation of patient's response to treatment, examination of patient, obtaining history from patient or surrogate, ordering and performing treatments and interventions, ordering and review of laboratory studies, ordering and review of radiographic studies, pulse oximetry and re-evaluation of patient's condition.

## 2024-03-26 NOTE — Progress Notes (Signed)
 Transported patient to C.T while patient was on the vent. Patient remained stable during transport.

## 2024-03-26 NOTE — Progress Notes (Signed)
 CPT held at this time due to EEG reading at bedside.

## 2024-03-26 NOTE — Plan of Care (Signed)
  Problem: Activity: Goal: Ability to return to baseline activity level will improve Outcome: Progressing   Problem: Cardiovascular: Goal: Ability to achieve and maintain adequate cardiovascular perfusion will improve Outcome: Progressing Goal: Vascular access site(s) Level 0-1 will be maintained Outcome: Progressing   Problem: Clinical Measurements: Goal: Ability to maintain clinical measurements within normal limits will improve Outcome: Progressing Goal: Will remain free from infection Outcome: Progressing Goal: Diagnostic test results will improve Outcome: Progressing Goal: Respiratory complications will improve Outcome: Progressing Goal: Cardiovascular complication will be avoided Outcome: Progressing   Problem: Nutrition: Goal: Adequate nutrition will be maintained Outcome: Progressing   Problem: Coping: Goal: Level of anxiety will decrease Outcome: Progressing   Problem: Elimination: Goal: Will not experience complications related to bowel motility Outcome: Progressing Goal: Will not experience complications related to urinary retention Outcome: Progressing   Problem: Pain Managment: Goal: General experience of comfort will improve and/or be controlled Outcome: Progressing   Problem: Safety: Goal: Ability to remain free from injury will improve Outcome: Progressing   Problem: Skin Integrity: Goal: Risk for impaired skin integrity will decrease Outcome: Progressing   Problem: Cardiac: Goal: Ability to achieve and maintain adequate cardiovascular perfusion will improve Outcome: Progressing   Problem: Health Behavior/Discharge Planning: Goal: Ability to safely manage health-related needs after discharge will improve Outcome: Progressing   Problem: Activity: Goal: Risk for activity intolerance will decrease Outcome: Progressing   Problem: Cardiac: Goal: Will achieve and/or maintain hemodynamic stability Outcome: Progressing   Problem:  Respiratory: Goal: Respiratory status will improve Outcome: Progressing   Problem: Skin Integrity: Goal: Wound healing without signs and symptoms of infection Outcome: Progressing Goal: Risk for impaired skin integrity will decrease Outcome: Progressing   Problem: Urinary Elimination: Goal: Ability to achieve and maintain adequate renal perfusion and functioning will improve Outcome: Progressing

## 2024-03-26 NOTE — Progress Notes (Signed)
 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  Admitted 7/28 w/ chest pain. Initially started 7/26 went to ER and CEs and EKG neg so went home but CP persisted so returned.  In ER on 28th + trop I, new T wave inversion in anterior lateral leads.  Treated w/ supplemental oxygen, morphine , started on heparin  gtt.  Went to cath lab  Findings:  99% -80% in-stent restenosis of LAD balloon angioplasty completed w/ partial restoration of flow (this re-stenosed stent was felt culprit lesion). RCA 45 to 70% stenosed, cx was 40%. Had nml LVEDP, no AS, referred to CVTS for CABG given 3V disease. and placed on plavix . EF from ECHO 30-35% w/ left regional wall motion abnormality.   Went to OR 8/4 for planned CABG x 3, as well as repair of ascending aortic dissection w/ vascular graft and repair of aortic valve  Time on bypass: ~ 4h40min EBL 1390 Cell saver 910 Received: FFP X2, PLTs 1 unit PRBC and cryo for on going surgical oozing Arrived in ICU w 480 ml bloody outpt from Chest tube Surgical team at bedside on arrival  Shortly after arrival to ICU had runs of VT and AF w/ RVR. Amiodarone  bolus and gtt initiated.  Got 2 gms calcium  another 2 units PLTs. TXA  Pertinent  Medical History  Prior CAD w/ DES X 2 and prior minimally invasive MVR 2015, NSCLC q/ mets to bone (2023) s/p chemo and XRT w/ most recent scans showing stable non-active disease  NSTEMI, HLD Significant Hospital Events: Including procedures, antibiotic start and stop dates in addition to other pertinent events   7/28 admitted w/ CP and acute inf/lat MI, cardiac cath svr 3V disease w/ re-stenosed LAD stent and  incomplete restoration of coronary flow. EF 30-35% referred for CABG 8/4 to OR. CABG x 3 v but as completing CABG noted blood loss and intra-op ECHO showed dissection of Aorta. So underwent AV graft placement and AVR. Large EBL in OR received several blood products. Immediate post op w/ large vol CT output, runs of VT and SVT. Got 2gms calcium , amiodarone  and placed on overdrive pacing via pacer critical care at bedside assisting w/ support  8/5 myoclonus; normal head CT& CTA, EEG started. Loaded with keppra .  8/6 remained on EEG, weaned off propofol  8/7 woke up, followed commands 8/8 started pneumonia antibiotics 8/9 failed SBT; milrinone  discontinued 8/10 extubated 8/11 required reintubation, bronch 8/12 R pigtail  Interim History / Subjective:  Febrile this am. Sedated.  Objective    Blood pressure 139/61, pulse 97, temperature (!) 100.9 F (38.3 C), resp. rate (!) 21, height 6' 4 (1.93 m), weight 75.7 kg, SpO2 96%. CVP:  [5 mmHg-26 mmHg] 13 mmHg  Vent Mode: PRVC FiO2 (%):  [40 %] 40 % Set Rate:  [20 bmp] 20 bmp Vt Set:  [620 mL] 620 mL PEEP:  [5 cmH20] 5 cmH20 Plateau Pressure:  [17 cmH20-21 cmH20] 19 cmH20   Intake/Output Summary (Last 24 hours) at 03/26/2024 0858 Last data filed at 03/26/2024 0700 Gross per 24 hour  Intake 3614.19 ml  Output 5855 ml  Net -2240.81 ml   Alan [name]  Weights   03/24/24 0500 03/25/24 0500 03/26/24 0432  Weight: 73.7 kg 76.8 kg 75.7 kg    Examination: Acute on chronically ill Having some LLE and LUE twitching Not following commands Pupils equal Winces to pain but not localizing Pupils small reactive, doll's/corneals present Abd soft +BS Minimal edema Lungs sound more clear, vent mechanics okay when he does not fight it Actually does pretty well on PS  Sodium and BUN up CVP remains a bit up Plts up a bit Hgb stable   albumin  human Stopped (03/18/24 0335)   aztreonam  Stopped (03/26/24 0544)   famotidine  (PEPCID ) IV Stopped (03/25/24  2203)   feeding supplement (VITAL 1.5 CAL) 60 mL/hr at 03/26/24 0700   fentaNYL  infusion INTRAVENOUS 25 mcg/hr (03/26/24 0700)   linezolid  (ZYVOX ) IV     metronidazole      propofol  (DIPRIVAN ) infusion 5 mcg/kg/min (03/26/24 0700)     Resolved problem list  Post operative acute blood loss w/ consumptive coagulopathy & thrombocytopenia following prolonged CPB time  right hemothorax> resolved Post-op VT; not recurrent Prolonged Qtc- on 8/9  Assessment and Plan   3V CAD and restenosis of DES now s/p CABG x 3 V w/ intraoperative finding of ascending aorta dissection requiring aortic graft and AVR Acute on chronic HFrEF due to ICM with cardiogenic shock improved but volume up H/o HLD, HLD and CAD w/ severe mitral valve disease. Prior stents and mini- mitral valve repair (2015) for severe MR. Acute hypoxic respiratory failure with H.flu PNA - failed extubation 8/10, reintubated 8/11. 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 Last PET w/ no active disease- in remission since 2023. Low grade fevers- duplex neg, GPC clusters; vanc/aztreonam  former subtherapeutic Hypernatremia- related to rescuscitation, diuretics Small embolic strokes, Twitching L>R- r/o seizure could be subcortical; similar issue prior in stay Thrombocytopenia- improving Muscular deconditioning POA- going to be biggest issue going forward  - Lasix /metolazone ; don't really like the free water  lasix  combo - Keep both chest tubes for now - Abx from vanc/aztreonam  to linezolid /aztreonam /flagyl  to maximize coverage with ongoing fevers, f/u culture data; make sure bone marrow functionality stable - Stat EEG - Repeat CT H/C/A/P; head to assure nothing new structurally and C/A/P to make sure no e/o progression of prior stage IV lung cancer before we offer tracheostomy - Will call wife; pending results of above studies will likely try one  more extubation and if fails go to tracheostomy - Plan d/w AHF, TCTS - DVT ppx on hold 1 more day  40 min cc time  Rolan Sharps MD PCCM

## 2024-03-26 NOTE — Progress Notes (Signed)
 PT Cancellation Note  Patient Details Name: JYDEN KROMER MRN: [id number] DOB: [birth year]   Cancelled Treatment:    Reason Eval/Treat Not Completed: Medical issues which prohibited therapy (Cancelled due to pt remains intubated and sedated. Will continue to follow up as able and appropriate.)  Dorothyann Maier, DPT, CLT  Acute Rehabilitation Services Office: [phone] (Secure chat preferred)   Dorothyann VEAR Maier 03/26/2024, 11:14 AM

## 2024-03-26 NOTE — Progress Notes (Signed)
 SLP Cancellation Note  Patient Details Name: Alan [name] MRN: [id number] DOB: 19-May-1956   Cancelled treatment:       Reason Eval/Treat Not Completed: Patient not medically ready   Janya Eveland, Consuelo Fitch 03/26/2024, 12:56 PM

## 2024-03-26 NOTE — Progress Notes (Signed)
 OT Cancellation Note  Patient Details Name: Alan [name] MRN: [id number] DOB: 1956/03/11   Cancelled Treatment:    Reason Eval/Treat Not Completed: Patient not medically ready. Intubated and sedated; per RN, plans to extubate and trach tomorrow if pt does not tolerate. Will plan to check on 8/15  [name] [name], OTR/L [facility] Acute Rehabilitation Office: [phone]   [name] 03/26/2024, 3:09 PM

## 2024-03-27 ENCOUNTER — Inpatient Hospital Stay (HOSPITAL_COMMUNITY)

## 2024-03-27 DIAGNOSIS — J9601 Acute respiratory failure with hypoxia: Secondary | ICD-10-CM | POA: Diagnosis not present

## 2024-03-27 DIAGNOSIS — I5023 Acute on chronic systolic (congestive) heart failure: Secondary | ICD-10-CM | POA: Diagnosis not present

## 2024-03-27 DIAGNOSIS — I5021 Acute systolic (congestive) heart failure: Secondary | ICD-10-CM | POA: Diagnosis not present

## 2024-03-27 DIAGNOSIS — J939 Pneumothorax, unspecified: Secondary | ICD-10-CM | POA: Diagnosis not present

## 2024-03-27 DIAGNOSIS — I214 Non-ST elevation (NSTEMI) myocardial infarction: Secondary | ICD-10-CM | POA: Diagnosis not present

## 2024-03-27 LAB — GLUCOSE, CAPILLARY
Glucose-Capillary: 116 mg/dL — ABNORMAL HIGH (ref 70–99)
Glucose-Capillary: 105 mg/dL — ABNORMAL HIGH (ref 70–99)
Glucose-Capillary: 112 mg/dL — ABNORMAL HIGH (ref 70–99)
Glucose-Capillary: 142 mg/dL — ABNORMAL HIGH (ref 70–99)
Glucose-Capillary: 126 mg/dL — ABNORMAL HIGH (ref 70–99)
Glucose-Capillary: 126 mg/dL — ABNORMAL HIGH (ref 70–99)
Glucose-Capillary: 130 mg/dL — ABNORMAL HIGH (ref 70–99)
Glucose-Capillary: 141 mg/dL — ABNORMAL HIGH (ref 70–99)

## 2024-03-27 LAB — COOXEMETRY PANEL
Carboxyhemoglobin: 2.2 % — ABNORMAL HIGH (ref 0.5–1.5)
Methemoglobin: 1.1 % (ref 0.0–1.5)
O2 Saturation: 74.9 %
Total hemoglobin: 8.7 g/dL — ABNORMAL LOW (ref 12.0–16.0)

## 2024-03-27 LAB — MAGNESIUM: Magnesium: 2.6 mg/dL — ABNORMAL HIGH (ref 1.7–2.4)

## 2024-03-27 LAB — BASIC METABOLIC PANEL WITH GFR
Anion gap: 8 (ref 5–15)
BUN: 50 mg/dL — ABNORMAL HIGH (ref 8–23)
CO2: 29 mmol/L (ref 22–32)
Calcium: 8.1 mg/dL — ABNORMAL LOW (ref 8.9–10.3)
Chloride: 113 mmol/L — ABNORMAL HIGH (ref 98–111)
Creatinine, Ser: 0.72 mg/dL (ref 0.61–1.24)
GFR, Estimated: >60 mL/min (ref >60)
Glucose, Bld: 117 mg/dL — ABNORMAL HIGH (ref 70–99)
Potassium: 3.6 mmol/L (ref 3.5–5.1)
Sodium: 150 mmol/L — ABNORMAL HIGH (ref 135–145)

## 2024-03-27 LAB — CBC
HCT: 27 % — ABNORMAL LOW (ref 39.0–52.0)
Hemoglobin: 8.3 g/dL — ABNORMAL LOW (ref 13.0–17.0)
MCH: 30.5 pg (ref 26.0–34.0)
MCHC: 30.7 g/dL (ref 30.0–36.0)
MCV: 99.3 fL (ref 80.0–100.0)
Platelets: 123 K/uL — ABNORMAL LOW (ref 150–400)
RBC: 2.72 MIL/uL — ABNORMAL LOW (ref 4.22–5.81)
RDW: 16.2 % — ABNORMAL HIGH (ref 11.5–15.5)
WBC: 13.5 K/uL — ABNORMAL HIGH (ref 4.0–10.5)
nRBC: 0 % (ref 0.0–0.2)

## 2024-03-27 MED ORDER — ACETAMINOPHEN 325 MG PO TABS
650.0000 mg | ORAL_TABLET | Freq: Four times a day (QID) | ORAL | Status: DC
  Administered 2024-03-27 – 2024-03-31 (×18): 650 mg
  Filled 2024-03-27 (×18): qty 2

## 2024-03-27 MED ORDER — POTASSIUM CHLORIDE 20 MEQ PO PACK
40.0000 meq | PACK | ORAL | Status: AC
  Administered 2024-03-27 (×2): 40 meq
  Filled 2024-03-27 (×2): qty 2

## 2024-03-27 MED ORDER — DEXMEDETOMIDINE HCL IN NACL 400 MCG/100ML IV SOLN
0.0000 ug/kg/h | INTRAVENOUS | Status: DC
  Administered 2024-03-27: 0.5 ug/kg/h via INTRAVENOUS
  Filled 2024-03-27 (×2): qty 100

## 2024-03-27 MED ORDER — DOCUSATE SODIUM 100 MG PO CAPS: 100.0000 mg | ORAL_CAPSULE | Freq: Two times a day (BID) | ORAL | Status: DC

## 2024-03-27 MED ORDER — MIDAZOLAM HCL 2 MG/2ML IJ SOLN: 2.0000 mg | Freq: Once | INTRAMUSCULAR | Status: AC

## 2024-03-27 MED ORDER — LIDOCAINE-EPINEPHRINE (PF) 2 %-1:200000 IJ SOLN
INTRAMUSCULAR | Status: AC
  Administered 2024-03-27: 20 mL
  Filled 2024-03-27: qty 20

## 2024-03-27 MED ORDER — LEVOFLOXACIN IN D5W 750 MG/150ML IV SOLN
750.0000 mg | INTRAVENOUS | Status: DC
  Administered 2024-03-27 – 2024-03-30 (×4): 750 mg via INTRAVENOUS
  Filled 2024-03-27 (×5): qty 150

## 2024-03-27 MED ORDER — FUROSEMIDE 10 MG/ML IJ SOLN
80.0000 mg | Freq: Once | INTRAMUSCULAR | Status: AC
  Administered 2024-03-27: 80 mg via INTRAVENOUS
  Filled 2024-03-27: qty 8

## 2024-03-27 MED ORDER — ATORVASTATIN CALCIUM 80 MG PO TABS
80.0000 mg | ORAL_TABLET | Freq: Every day | ORAL | Status: DC
  Administered 2024-03-27 – 2024-04-01 (×6): 80 mg
  Filled 2024-03-27 (×5): qty 1

## 2024-03-27 MED ORDER — ACETAMINOPHEN 325 MG PO TABS
650.0000 mg | ORAL_TABLET | Freq: Four times a day (QID) | ORAL | Status: DC
  Filled 2024-03-27: qty 2

## 2024-03-27 MED ORDER — ENOXAPARIN SODIUM 40 MG/0.4ML IJ SOSY
40.0000 mg | PREFILLED_SYRINGE | INTRAMUSCULAR | Status: DC
  Administered 2024-03-27: 40 mg via SUBCUTANEOUS
  Filled 2024-03-27: qty 0.4

## 2024-03-27 MED ORDER — TRANEXAMIC ACID FOR INHALATION
500.0000 mg | Freq: Three times a day (TID) | RESPIRATORY_TRACT | Status: AC
  Administered 2024-03-27 – 2024-03-30 (×8): 500 mg via RESPIRATORY_TRACT
  Filled 2024-03-27 (×6): qty 10
  Filled 2024-03-27: qty 5
  Filled 2024-03-27 (×4): qty 10

## 2024-03-27 MED ORDER — AMIODARONE HCL 200 MG PO TABS
200.0000 mg | ORAL_TABLET | Freq: Every day | ORAL | Status: DC
  Filled 2024-03-27: qty 1

## 2024-03-27 MED ORDER — DOCUSATE SODIUM 50 MG/5ML PO LIQD
100.0000 mg | Freq: Two times a day (BID) | ORAL | Status: DC
  Filled 2024-03-27: qty 10

## 2024-03-27 MED ORDER — ROCURONIUM BROMIDE 10 MG/ML (PF) SYRINGE
PREFILLED_SYRINGE | INTRAVENOUS | Status: AC
  Administered 2024-03-27: 80 mg via INTRAVENOUS
  Filled 2024-03-27: qty 10

## 2024-03-27 MED ORDER — SPIRONOLACTONE 25 MG PO TABS
25.0000 mg | ORAL_TABLET | Freq: Every day | ORAL | Status: DC
  Administered 2024-03-27 – 2024-04-01 (×6): 25 mg
  Filled 2024-03-27 (×5): qty 1

## 2024-03-27 MED ORDER — FENTANYL CITRATE PF 50 MCG/ML IJ SOSY: 50.0000 ug | PREFILLED_SYRINGE | Freq: Once | INTRAMUSCULAR | Status: AC

## 2024-03-27 MED ORDER — POTASSIUM CHLORIDE 20 MEQ PO PACK: 20.0000 meq | PACK | ORAL | Status: DC

## 2024-03-27 MED ORDER — OXYCODONE HCL 5 MG PO TABS: 5.0000 mg | ORAL_TABLET | ORAL | Status: DC | PRN

## 2024-03-27 MED ORDER — SPIRONOLACTONE 25 MG PO TABS
25.0000 mg | ORAL_TABLET | Freq: Every day | ORAL | Status: DC
  Filled 2024-03-27: qty 1

## 2024-03-27 MED ORDER — FENTANYL CITRATE PF 50 MCG/ML IJ SOSY
PREFILLED_SYRINGE | INTRAMUSCULAR | Status: AC
  Administered 2024-03-27: 50 ug via INTRAVENOUS
  Filled 2024-03-27: qty 2

## 2024-03-27 MED ORDER — METOLAZONE 2.5 MG PO TABS
2.5000 mg | ORAL_TABLET | Freq: Once | ORAL | Status: AC
  Administered 2024-03-27: 2.5 mg
  Filled 2024-03-27: qty 1

## 2024-03-27 MED ORDER — ROCURONIUM BROMIDE 10 MG/ML (PF) SYRINGE: 80.0000 mg | PREFILLED_SYRINGE | Freq: Once | INTRAVENOUS | Status: AC

## 2024-03-27 MED ORDER — ATORVASTATIN CALCIUM 80 MG PO TABS
80.0000 mg | ORAL_TABLET | Freq: Every day | ORAL | Status: DC
  Filled 2024-03-27: qty 1

## 2024-03-27 MED ORDER — LIDOCAINE-EPINEPHRINE (PF) 2 %-1:200000 IJ SOLN: 20.0000 mL | Freq: Once | INTRAMUSCULAR | Status: AC

## 2024-03-27 MED ORDER — AMIODARONE HCL 200 MG PO TABS
200.0000 mg | ORAL_TABLET | Freq: Every day | ORAL | Status: DC
  Administered 2024-03-27 – 2024-04-01 (×6): 200 mg
  Filled 2024-03-27 (×5): qty 1

## 2024-03-27 MED ORDER — GERHARDT'S BUTT CREAM
TOPICAL_CREAM | Freq: Every day | CUTANEOUS | Status: DC | PRN
  Administered 2024-03-30 – 2024-04-01 (×3): 1 via TOPICAL
  Filled 2024-03-27 (×2): qty 60

## 2024-03-27 MED ORDER — DEXMEDETOMIDINE HCL IN NACL 400 MCG/100ML IV SOLN
INTRAVENOUS | Status: AC
  Administered 2024-03-27: 0.4 ug/kg/h via INTRAVENOUS
  Filled 2024-03-27: qty 100

## 2024-03-27 MED ORDER — METOLAZONE 5 MG PO TABS
5.0000 mg | ORAL_TABLET | Freq: Once | ORAL | Status: AC
Start: 2024-03-27 — End: 2024-03-27
  Administered 2024-03-27: 5 mg via ORAL
  Filled 2024-03-27: qty 1

## 2024-03-27 MED ORDER — POTASSIUM CHLORIDE 20 MEQ PO PACK
20.0000 meq | PACK | ORAL | Status: DC
  Administered 2024-03-27: 20 meq via ORAL
  Filled 2024-03-27 (×2): qty 1

## 2024-03-27 MED ORDER — ETOMIDATE 2 MG/ML IV SOLN: 20.0000 mg | Freq: Once | INTRAVENOUS | Status: AC

## 2024-03-27 MED ORDER — MIDAZOLAM HCL 2 MG/2ML IJ SOLN
INTRAMUSCULAR | Status: AC
  Administered 2024-03-27: 2 mg via INTRAVENOUS
  Filled 2024-03-27: qty 4

## 2024-03-27 MED ORDER — METOLAZONE 5 MG PO TABS: 5.0000 mg | ORAL_TABLET | Freq: Once | ORAL | Status: DC

## 2024-03-27 MED ORDER — ETOMIDATE 2 MG/ML IV SOLN
INTRAVENOUS | Status: AC
  Administered 2024-03-27: 20 mg via INTRAVENOUS
  Filled 2024-03-27: qty 10

## 2024-03-27 NOTE — Progress Notes (Signed)
 10 Days Post-Op Procedure(s) (LRB): CORONARY ARTERY BYPASS GRAFTING TIMES THREE , USING LEFT INTERNAL MAMMARY ARTERY AND ENDOSCOPIC HARVESTED RIGHT SAPHENOUS VEIN (N/A) ECHOCARDIOGRAM, TRANSESOPHAGEAL, INTRAOPERATIVE (N/A) REPAIR, AORTIC DISSECTION, ASCENDING USING 30 MM HEMASHIELD PLATINUM VASCULAR GRAFT RESUSPENSION OF AORTIC VALVE Subjective: sedated  Objective: Vital signs in last 24 hours: Temp:  [99.3 F (37.4 C)-101.8 F (38.8 C)] 100.4 F (38 C) (08/14 0645) Pulse Rate:  [89-104] 97 (08/14 0645) Cardiac Rhythm: Normal sinus rhythm (08/14 0400) Resp:  [11-32] 32 (08/14 0645) BP: (76-166)/(43-68) 124/54 (08/14 0600) SpO2:  [91 %-100 %] 98 % (08/14 0645) Arterial Line BP: (99-218)/(33-107) 156/51 (08/14 0645) FiO2 (%):  [40 %] 40 % (08/14 0400) Weight:  [72.5 kg] 72.5 kg (08/14 0500)  Hemodynamic parameters for last 24 hours: CVP:  [5 mmHg-20 mmHg] 9 mmHg  Intake/Output from previous day: 08/13 0701 - 08/14 0700 In: 3589.8 [I.V.:286.6; NG/GT:2270; IV Piggyback:1023.2] Out: 6110 [Urine:5360; Chest Tube:750] Intake/Output this shift: Total I/O In: 1773.1 [I.V.:149.9; NG/GT:1150; IV Piggyback:463.2] Out: 2100 [Urine:1710; Chest Tube:390]  General appearance: ill-appearing Neurologic: sedated, not following commands Heart: regular rate and rhythm Lungs: coarse BS bilaterally Abdomen: soft + BS Extremities: toes demarcating Wound: clean and intact  Lab Results: Recent Labs    03/26/24 0420 03/27/24 0423  WBC 11.9* 13.5*  HGB 7.9* 8.3*  HCT 25.7* 27.0*  PLT 96* 123*   BMET:  Recent Labs    03/26/24 1505 03/27/24 0423  NA 151* 150*  K 4.1 3.6  CL 116* 113*  CO2 26 29  GLUCOSE 146* 117*  BUN 50* 50*  CREATININE 0.68 0.72  CALCIUM  7.8* 8.1*    PT/INR: No results for input(s): LABPROT, INR in the last 72 hours. ABG    Component Value Date/Time   PHART 7.376 03/24/2024 1039   HCO3 29.8 (H) 03/24/2024 1039   TCO2 31 03/24/2024 1039   ACIDBASEDEF  3.0 (H) 03/17/2024 2158   O2SAT 74.9 03/27/2024 0423   CBG (last 3)  Recent Labs    03/26/24 2328 03/27/24 0310 03/27/24 0350  GLUCAP 126* 116* 105*    Assessment/Plan: S/P Procedure(s) (LRB): CORONARY ARTERY BYPASS GRAFTING TIMES THREE , USING LEFT INTERNAL MAMMARY ARTERY AND ENDOSCOPIC HARVESTED RIGHT SAPHENOUS VEIN (N/A) ECHOCARDIOGRAM, TRANSESOPHAGEAL, INTRAOPERATIVE (N/A) REPAIR, AORTIC DISSECTION, ASCENDING USING 30 MM HEMASHIELD PLATINUM VASCULAR GRAFT RESUSPENSION OF AORTIC VALVE POD # 10] NEURO- sedated this AM  Multiple embolic strokes  Seizures- controlled with Keppra  CV- s/p CABG, repair type 1 aortic dissection  In SR on amiodarone ,  Co-ox 75, CVP 11  Cuff BP well controlled RESP- VDRF, HAP  PRVC 20/40%/5 PEEP  Plan is to try for extubation today although I am not optimistic ID- fevers persist, WBC up slightly  On linezolid , aztreonam  and metronidazole  RENAL- > 5L of UO yesterday  Creatinine normal, BUN elevated but stable  Supplement K Nutrition- on Tf GI- tolerating TF  Pepcid  for ulcer prophylaxis ENDO- CBG well controlled    LOS: 17 days    Alan [name] 03/27/2024

## 2024-03-27 NOTE — Procedures (Signed)
 Percutaneous Tracheostomy Procedure Note   Alan [name]  [id number]  [birth year]  Date:03/27/24  Time:12:28 PM   Provider Performing:Dynastie Knoop  Procedure: Percutaneous Tracheostomy with Bronchoscopic Guidance (68399)  Indication(s) Acute respiratory failure  Consent Risks of the procedure as well as the alternatives and risks of each were explained to the patient and/or caregiver.  Consent for the procedure was obtained.  Anesthesia Etomidate , Versed , Fentanyl , Rocuronium    Time Out Verified patient identification, verified procedure, site/side was marked, verified correct patient position, special equipment/implants available, medications/allergies/relevant history reviewed, required imaging and test results available.   Sterile Technique Maximal sterile technique including sterile barrier drape, hand hygiene, sterile gown, sterile gloves, mask, hair covering.    Procedure Description Appropriate anatomy identified by palpation.  Patient's neck prepped and draped in sterile fashion.  1% lidocaine  with epinephrine  was used to anesthetize skin overlying neck.  1.5cm incision made and blunt dissection performed until tracheal rings could be easily palpated.   Then a size 6 Shiley tracheostomy was placed under bronchoscopic visualization using usual Seldinger technique and serial dilation.   Bronchoscope confirmed placement above the carina.  Tracheostomy was sutured in place with adhesive pad to protect skin under pressure.    Patient connected to ventilator.   Complications/Tolerance None; patient tolerated the procedure well. Chest X-ray is ordered to confirm no post-procedural complication.   EBL Minimal   Specimen(s) None

## 2024-03-27 NOTE — Procedures (Signed)
 Intubation Procedure Note  Alan [name]  [id number]  1956/05/13  Date:03/27/24  Time:12:28 PM   Provider Performing:Sahmir Weatherbee C Claudene    Procedure: Intubation (31500)  Indication(s) Respiratory Failure  Consent Unable to obtain consent due to emergent nature of procedure.   Anesthesia Etomidate    Time Out Verified patient identification, verified procedure, site/side was marked, verified correct patient position, special equipment/implants available, medications/allergies/relevant history reviewed, required imaging and test results available.   Sterile Technique Usual hand hygeine, masks, and gloves were used   Procedure Description Patient positioned in bed supine.  Sedation given as noted above.  Patient was intubated with endotracheal tube using Glidescope.  View was Grade 1 full glottis .  Number of attempts was 1.  Colorimetric CO2 detector was consistent with tracheal placement.   Complications/Tolerance None; patient tolerated the procedure well. Chest X-ray is ordered to verify placement.   EBL Minimal   Specimen(s) None

## 2024-03-27 NOTE — Progress Notes (Signed)
 SLP Cancellation Note  Patient Details Name: Alan [name] MRN: [id number] DOB: 08/20/1955   Cancelled treatment:       Reason Eval/Treat Not Completed: Patient with new tracheostomy. Orders for SLP eval and treat for PMSV and swallowing received. Will follow pt closely for readiness for SLP interventions as appropriate.     Damien Blumenthal, M.A., CCC-SLP Speech Language Pathology, Acute Rehabilitation Services  Secure Chat preferred [phone]  03/27/2024, 2:23 PM

## 2024-03-27 NOTE — TOC Progression Note (Addendum)
 Transition of Care [facility]) - Progression Note    Patient Details  Name: Alan [name] MRN: [id number] Date of Birth: [birth year]  Transition of Care [facility]) CM/SW Contact  Justina Delcia Czar, RN Phone Number: [phone] 03/27/2024, 2:10 PM  Clinical Narrative:     Chart reviewed for discharge readiness, patient not medically stable for d/c. Inpatient CM/CSW will continue to monitor pt's advancement through interdisciplinary progression rounds. If new pt transition needs arise, MD please place a TOC consult.    5:05 pm  Referral received for LTAC, spoke to wife and offered choice for LTAC. Medicare.gov list placed in chart and placed in room. She would like to tour both prior to making decision. Contacted [employer] rep, Ciera with request. Contacted [facility] rep, Carlos with request.   Expected Discharge Plan: Home w Home Health Services Barriers to Discharge: Continued Medical Work up               Expected Discharge Plan and Services In-house Referral: NA Discharge Planning Services: CM Consult Post Acute Care Choice: Home Health Living arrangements for the past 2 months: Single Family Home                   DME Agency: NA                   Social Drivers of Health (SDOH) Interventions SDOH Screenings   Food Insecurity: No Food Insecurity (03/11/2024)  Housing: Low Risk  (03/11/2024)  Transportation Needs: No Transportation Needs (03/11/2024)  Utilities: Not At Risk (03/11/2024)  Depression (PHQ2-9): Low Risk  (10/05/2022)  Social Connections: Socially Integrated (03/10/2024)  Tobacco Use: Low Risk  (03/17/2024)    Readmission Risk Interventions     No data to display

## 2024-03-27 NOTE — Progress Notes (Signed)
 Advanced Heart Failure Rounding Note  Cardiologist: [name], MD   Chief Complaint: Post-op CABG and Bentall procedure   Patient Profile  68 y.o. male with history of CAD, severe MR s/p minimally invasive mitral valve repair, stage III NSCLC.   Patient admitted with NSTEMI and new systolic CHF due to ICM. Underwent 3v CABG X 3 c/b ascending aortic dissection s/p Bentall and resuspenison of AoV. Course further c/b multiple embolic brain infarcts, myoclonic seizures and H influenzae PNA.   Subjective:   8/5 S/P CABG x3 8/11 - Reintubated. S/P Bronch. Septic picture. Vascular consulted. No DVT.  R AV fistula R groin, conservative  management.  8/12 S/P R Thoracentesis. Diuresed with IV lasix .   Remains intubated. C/w persistent fevers despite abx. BCx negative.   Co-ox 75% off milrinone . 5.7 L in UOP yesterday w/ IV Lasix  + metolazone . CVP 10-12 today   Na 150, K 3.6, Scr 0.72    Objective:   Weight Range: 72.5 kg Body mass index is 19.46 kg/m.   Vital Signs:   Temp:  [99.3 F (37.4 C)-101.8 F (38.8 C)] 100.6 F (38.1 C) (08/14 0715) Pulse Rate:  [89-104] 102 (08/14 0715) Resp:  [11-32] 22 (08/14 0715) BP: (76-166)/(43-68) 139/53 (08/14 0700) SpO2:  [91 %-100 %] 99 % (08/14 0715) Arterial Line BP: (99-218)/(33-107) 174/59 (08/14 0715) FiO2 (%):  [40 %] 40 % (08/14 0400) Weight:  [72.5 kg] 72.5 kg (08/14 0500) Last BM Date : 03/26/24  Weight change: Filed Weights   03/25/24 0500 03/26/24 0432 03/27/24 0500  Weight: 76.8 kg 75.7 kg 72.5 kg    Intake/Output:   Intake/Output Summary (Last 24 hours) at 03/27/2024 0723 Last data filed at 03/27/2024 0600 Gross per 24 hour  Intake 3589.77 ml  Output 6110 ml  Net -2520.23 ml      Physical Exam   GENERAL: critically ill, weak/deconditioned appearing, intubated, awake on vent. No distress  ENT: + ETT, + Cor trak  Lungs- intubated, clear anteriorly  CARDIAC:  JVP: 12 cm          Normal rate with regular  rhythm. +CTs  ABDOMEN: Soft, non-tender, non-distended.  EXTREMITIES: mottled feet bilaterally, LEs cool to touch  GU: + foley    Telemetry   Sinus tach, low 100s. Personally reviewed   Labs    CBC Recent Labs    03/26/24 0420 03/27/24 0423  WBC 11.9* 13.5*  HGB 7.9* 8.3*  HCT 25.7* 27.0*  MCV 99.2 99.3  PLT 96* 123*   Basic Metabolic Panel Recent Labs    91/86/74 0420 03/26/24 1505 03/27/24 0423  NA 151* 151* 150*  K 3.4* 4.1 3.6  CL 116* 116* 113*  CO2 26 26 29   GLUCOSE 130* 146* 117*  BUN 54* 50* 50*  CREATININE 0.74 0.68 0.72  CALCIUM  7.9* 7.8* 8.1*  MG 2.5*  --  2.6*     Medications:     Scheduled Medications:  acetaminophen   650 mg Oral Q6H   amiodarone   200 mg Oral Daily   aspirin  EC  325 mg Oral Daily   Or   aspirin   324 mg Per Tube Daily   atorvastatin   80 mg Oral Daily   bisacodyl   10 mg Oral Daily   Or   bisacodyl   10 mg Rectal Daily   Chlorhexidine  Gluconate Cloth  6 each Topical Daily   docusate sodium   100 mg Oral BID   feeding supplement (PROSource TF20)  60 mL Per Tube TID  free water   200 mL Per Tube Q6H   insulin  aspart  0-24 Units Subcutaneous Q4H   insulin  glargine-yfgn  10 Units Subcutaneous Daily   levETIRAcetam   1,000 mg Intravenous Q12H   mouth rinse  15 mL Mouth Rinse Q2H   polyethylene glycol  17 g Per Tube Daily   potassium chloride   20 mEq Oral Q4H   sodium chloride  flush  10 mL Intrapleural Q8H   sodium chloride  flush  10-40 mL Intracatheter Q12H   sodium chloride  flush  3 mL Intravenous Q12H   spironolactone   25 mg Oral Daily    Infusions:  aztreonam  2 g (03/27/24 0611)   famotidine  (PEPCID ) IV Stopped (03/26/24 2234)   feeding supplement (VITAL 1.5 CAL) 60 mL/hr at 03/27/24 0600   fentaNYL  infusion INTRAVENOUS 100 mcg/hr (03/27/24 0600)   linezolid  (ZYVOX ) IV Stopped (03/26/24 2329)   metronidazole  Stopped (03/26/24 2224)   propofol  (DIPRIVAN ) infusion 10 mcg/kg/min (03/27/24 0600)    PRN  Medications: fentaNYL , hydrALAZINE , ipratropium-albuterol , midazolam , ondansetron  (ZOFRAN ) IV, mouth rinse, oxyCODONE , sodium chloride  flush    Assessment/Plan   NSTEMI with known CAD - Remote anterior STEMI with prior stents to LAD - NSTEMI - 3 V CAD on cath with 99% in-stent restenosis in LAD, 50% residual stenosis post PTCA - CABG X 3 (LIMA to LAD, SVG to OM, SBVG to PDA) 03/17/24 as above - Aspirin  + statin.   Ascending Aortic Dissection  - complication of CABG - s/p Bentall and resuspenison of AoV    Acute systolic CHF/ICM - Newly reduced EF - Echo 03/10/24: EF 30-35%, anterior WMA, RV mildly reduced - Post CABG limited echo 8/7: EF improving, 40-45% w/ septal dyssynchrony  - Off Milrinone . CO-OX 75%. Diuresed well yesterday. CVP 10-12 today  - Continue IV Lasix  80 mg x 1 + metolazone  2.5 mg   - Continue 25 mg spiro daily   Acute hypoxic resp failure/ Suspected PNA  - Tracheal aspirate with parainfluenzae + GPCs - 8/11 Reintubated.  - covering w/ linezolid / flagyl /azactam  - plan extubation trial today. If fails, will proceed w/ trach    Fever - Concern about sepsis. As above on linezolid / flagyl /azactam  - Blood cultures- NGTD    - Respiratory CX- Rare gram +  cocci  Severe MR s/p minimally invasive mitral valve repair - trivial MR with mean gradient of 4 mmhg on echo this admit    Postop anemia/thrombocytopenia - Multiple blood products in OR and immediately post-op - Hgb stable 8.3 today. Hold off on DVT prophylaxis.  - Platelets improving 123K - Has not been on heparin  post-op. Plan for DVT ppx starting tonight   NSVT - NO recurrence.  - Continue po amio.     NSCLC w/bone mets - S/p chemoradiation and immunotherapy - Stable disease on most recent outpatinet imaging. Followed by Heme/Onc - Possible new sclerotic lesion/small LUL nodule on CXR this admission. Will eventually require CT.   Myoclonic seizures - Resolved. Suspect hypoxic/anoxic brain injury -  Neurology following - Off sedation and following commands.  - Keppra  - MRI brain with multiple small infarcts   Left pneumothorax - per CT surgery keep left chest tube    Lower Extremities Mottling - Atheroembolic event to both feet  - VVS consulted.  - US  R groin -AV Fistula. No plan for intervention at this time.   [facility] following.   CRITICAL CARE Performed by: Caffie Shed   Total critical care time: 15 minutes  Critical care time was exclusive of  separately billable procedures and treating other patients.  Critical care was necessary to treat or prevent imminent or life-threatening deterioration.  Critical care was time spent personally by me on the following activities: development of treatment plan with patient and/or surrogate as well as nursing, discussions with consultants, evaluation of patient's response to treatment, examination of patient, obtaining history from patient or surrogate, ordering and performing treatments and interventions, ordering and review of laboratory studies, ordering and review of radiographic studies, pulse oximetry and re-evaluation of patient's condition.   Caffie Shed, PA-C 03/27/2024

## 2024-03-27 NOTE — Progress Notes (Signed)
 NAME:  Alan [name], MRN:  [id number], DOB:  20-Nov-1955, LOS: 17 ADMISSION DATE:  03/10/2024, CONSULTATION DATE:  8/4 REFERRING MD:  [email or web address], CHIEF COMPLAINT:  post-op cardiac surgery    History of Present Illness:  68 year old male w/ h/o HTN, CAD, prior DES x 3 to LAD, minimally invasive MVR (2015), SVT s/p ablation,  metastatic NSCLC w/ mets to bone (in remission).  Admitted 7/28 w/ chest pain. Initially started 7/26 went to ER and CEs and EKG neg so went home but CP persisted so returned.  In ER on 28th + trop I, new T wave inversion in anterior lateral leads.  Treated w/ supplemental oxygen, morphine , started on heparin  gtt.  Went to cath lab  Findings:  99% -80% in-stent restenosis of LAD balloon angioplasty completed w/ partial restoration of flow (this re-stenosed stent was felt culprit lesion). RCA 45 to 70% stenosed, cx was 40%. Had nml LVEDP, no AS, referred to CVTS for CABG given 3V disease. and placed on plavix . EF from ECHO 30-35% w/ left regional wall motion abnormality.   Went to OR 8/4 for planned CABG x 3, as well as repair of ascending aortic dissection w/ vascular graft and repair of aortic valve  Time on bypass: ~ 4h36min EBL 1390 Cell saver 910 Received: FFP X2, PLTs 1 unit PRBC and cryo for on going surgical oozing Arrived in ICU w 480 ml bloody outpt from Chest tube Surgical team at bedside on arrival  Shortly after arrival to ICU had runs of VT and AF w/ RVR. Amiodarone  bolus and gtt initiated.  Got 2 gms calcium  another 2 units PLTs. TXA  Pertinent  Medical History  Prior CAD w/ DES X 2 and prior minimally invasive MVR 2015, NSCLC q/ mets to bone (2023) s/p chemo and XRT w/ most recent scans showing stable non-active disease  NSTEMI, HLD Significant Hospital Events: Including procedures, antibiotic start and stop dates in addition to other pertinent events   7/28 admitted w/ CP and acute inf/lat MI, cardiac cath svr 3V disease w/ re-stenosed LAD stent and  incomplete restoration of coronary flow. EF 30-35% referred for CABG 8/4 to OR. CABG x 3 v but as completing CABG noted blood loss and intra-op ECHO showed dissection of Aorta. So underwent AV graft placement and AVR. Large EBL in OR received several blood products. Immediate post op w/ large vol CT output, runs of VT and SVT. Got 2gms calcium , amiodarone  and placed on overdrive pacing via pacer critical care at bedside assisting w/ support  8/5 myoclonus; normal head CT& CTA, EEG started. Loaded with keppra .  8/6 remained on EEG, weaned off propofol  8/7 woke up, followed commands 8/8 started pneumonia antibiotics 8/9 failed SBT; milrinone  discontinued 8/10 extubated 8/11 required reintubation, bronch 8/12 R pigtail  Interim History / Subjective:  Still low grade fevers. Diuresed well. On sedation, slow to wake.  Objective    Blood pressure (!) 137/56, pulse (!) 103, temperature (!) 100.6 F (38.1 C), resp. rate 20, height 6' 4 (1.93 m), weight 72.5 kg, SpO2 96%. CVP:  [5 mmHg-20 mmHg] 9 mmHg  Vent Mode: PSV;CPAP FiO2 (%):  [40 %] 40 % Set Rate:  [20 bmp] 20 bmp Vt Set:  [620 mL] 620 mL PEEP:  [5 cmH20] 5 cmH20 Pressure Support:  [5 cmH20-15 cmH20] 5 cmH20 Plateau Pressure:  [14 cmH20-20 cmH20] 14 cmH20   Intake/Output Summary (Last 24 hours) at 03/27/2024 0913 Last data filed at 03/27/2024 0909 Gross per 24  hour  Intake 3436.69 ml  Output 7820 ml  Net -4383.31 ml   Filed Weights   03/25/24 0500 03/26/24 0432 03/27/24 0500  Weight: 76.8 kg 75.7 kg 72.5 kg    Examination: Acute on chronically ill Opens eyes and some movement to pain, not following commands Triggering vent, lungs still with some rhonci Improved anasarca Sternotomy looks okay RASS -1  Labs/imaging reviewed   Resolved problem list  Post operative acute blood loss w/ consumptive coagulopathy & thrombocytopenia following prolonged CPB time  right hemothorax> resolved Post-op VT; not  recurrent Prolonged Qtc- on 8/9 Thrombocytopenia- improving Assessment and Plan   3V CAD and restenosis of DES now s/p CABG x 3 V w/ intraoperative finding of ascending aorta dissection requiring aortic graft and AVR Acute on chronic HFrEF due to ICM with cardiogenic shock improved but volume up H/o HLD, HLD and CAD w/ severe mitral valve disease. Prior stents and mini- mitral valve repair (2015) for severe MR. Acute hypoxic respiratory failure with H.flu PNA - failed extubation 8/10, reintubated 8/11. 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 Last PET w/ no active disease- in remission since 2023. Low grade fevers- duplex neg, GPC clusters; vanc/aztreonam  former subtherapeutic Hypernatremia- related to rescuscitation, diuretics Small embolic strokes, Twitching L>R 8/13- repeat EEG and CT reassuring Muscular deconditioning POA- going to be biggest issue going forward  - Lasix /metolazone  targeting single digit CVP if we can - Keep both chest tubes for now, no air leak -  linezolid /aztreonam /flagyl  with ongoing fevers, f/u culture data; in absence of positive culture data end coverage in 4 days (~03/31/24) - Spoke with wife, extubation trial today, if fails proceed with trach - LTACH consult - Plan d/w AHF, TCTS - DVT ppx starting tonight  38 min cc time  Rolan Sharps MD PCCM

## 2024-03-27 NOTE — Progress Notes (Signed)
 03/27/2024 Called and updated wife RE reintubation and trach.  Rolan Sharps MD PCCM

## 2024-03-27 NOTE — Progress Notes (Addendum)
 PT Cancellation Note  Patient Details Name: Alan [name] MRN: [id number] DOB: 1956-02-27   Cancelled Treatment:    Reason Eval/Treat Not Completed: Medical issues which prohibited therapy  Remains intubated and sedated. Have been following along since last week, with multiple attempts to re-evaluate but has not been ready to tolerate assessment. Will sign off for now. Please re-order PT/OT evaluations when medically ready.   Thanks, Leontine Roads, PT, DPT [facility]  Rehabilitation Services Physical Therapist Office: [phone] Website: [email or web address]   Leontine GORMAN Roads 03/27/2024, 8:54 AM

## 2024-03-27 NOTE — Progress Notes (Addendum)
 03/27/2024 Relook bronch: bleeding now done in RLL/bronchus intermedius  On relook, he has a old-appearing defect at 8 o'clock near site of trach insertion that goes into a tissue passage that ends after about 6 cm- I do not see the cortrak nor trachea rings; prior imaging not really helpful.  Persistent fevers despite broad spectrum abx could be explained by a TE fistula although not really sure how this would have formed nor is direct visualization necessarily consistent with this.  6-0 shiley removed and distal XLT placed past defect, will d/w TCTS.  Appreciate RT help.  Rolan Sharps MD PCCM

## 2024-03-27 NOTE — Plan of Care (Signed)
  Problem: Education: Goal: Understanding of CV disease, CV risk reduction, and recovery process will improve Outcome: Progressing   Problem: Health Behavior/Discharge Planning: Goal: Ability to safely manage health-related needs after discharge will improve Outcome: Progressing   Problem: Education: Goal: Knowledge of General Education information will improve Description: Including pain rating scale, medication(s)/side effects and non-pharmacologic comfort measures Outcome: Progressing   Problem: Health Behavior/Discharge Planning: Goal: Ability to manage health-related needs will improve Outcome: Progressing   Problem: Clinical Measurements: Goal: Ability to maintain clinical measurements within normal limits will improve Outcome: Progressing Goal: Will remain free from infection Outcome: Progressing Goal: Diagnostic test results will improve Outcome: Progressing Goal: Cardiovascular complication will be avoided Outcome: Progressing   Problem: Nutrition: Goal: Adequate nutrition will be maintained Outcome: Progressing   Problem: Elimination: Goal: Will not experience complications related to bowel motility Outcome: Progressing Goal: Will not experience complications related to urinary retention Outcome: Progressing   Problem: Pain Managment: Goal: General experience of comfort will improve and/or be controlled Outcome: Progressing

## 2024-03-27 NOTE — Progress Notes (Signed)
 EVENING ROUNDS NOTE :     [address]       [place] [address]             [phone]                 10 Days Post-Op Procedure(s) (LRB): CORONARY ARTERY BYPASS GRAFTING TIMES THREE , USING LEFT INTERNAL MAMMARY ARTERY AND ENDOSCOPIC HARVESTED RIGHT SAPHENOUS VEIN (N/A) ECHOCARDIOGRAM, TRANSESOPHAGEAL, INTRAOPERATIVE (N/A) REPAIR, AORTIC DISSECTION, ASCENDING USING 30 MM HEMASHIELD PLATINUM VASCULAR GRAFT RESUSPENSION OF AORTIC VALVE   Total Length of Stay:  LOS: 17 days  Events:   S/p trach Off drips    BP (!) 102/56   Pulse 88   Temp 99.1 F (37.3 C)   Resp 16   Ht 6' 4 (1.93 m)   Wt 72.5 kg   SpO2 95%   BMI 19.46 kg/m   CVP:  [5 mmHg-20 mmHg] 8 mmHg  Vent Mode: PSV;CPAP FiO2 (%):  [40 %-100 %] 40 % Set Rate:  [20 bmp] 20 bmp Vt Set:  [320 mL-620 mL] 620 mL PEEP:  [5 cmH20] 5 cmH20 Pressure Support:  [5 cmH20-8 cmH20] 8 cmH20 Plateau Pressure:  [14 cmH20-20 cmH20] 17 cmH20   dexmedetomidine  (PRECEDEX ) IV infusion 0.5 mcg/kg/hr (03/27/24 1700)   famotidine  (PEPCID ) IV Stopped (03/27/24 1010)   feeding supplement (VITAL 1.5 CAL) 20 mL/hr at 03/27/24 1700   fentaNYL  infusion INTRAVENOUS 50 mcg/hr (03/27/24 1700)   levofloxacin  (LEVAQUIN ) IV Stopped (03/27/24 1443)   linezolid  (ZYVOX ) IV Stopped (03/27/24 1307)   metronidazole  Stopped (03/27/24 1117)   propofol  (DIPRIVAN ) infusion Stopped (03/27/24 0815)    I/O last 3 completed shifts: In: 5602.8 [I.V.:469.7; NG/GT:3570; IV Piggyback:1533.1] Out: 8260 [Urine:7190; Chest Tube:1070]      Latest Ref Rng & Units 03/27/2024    4:23 AM 03/26/2024    4:20 AM 03/25/2024   11:40 AM  CBC  WBC 4.0 - 10.5 K/uL 13.5  11.9  9.7   Hemoglobin 13.0 - 17.0 g/dL 8.3  7.9  7.8   Hematocrit 39.0 - 52.0 % 27.0  25.7  25.1   Platelets 150 - 400 K/uL 123  96  80        Latest Ref Rng & Units 03/27/2024    4:23 AM 03/26/2024    3:05 PM 03/26/2024    4:20 AM  BMP  Glucose 70 - 99 mg/dL 882  853  869    BUN 8 - 23 mg/dL 50  50  54   Creatinine 0.61 - 1.24 mg/dL 9.27  9.31  9.25   Sodium 135 - 145 mmol/L 150  151  151   Potassium 3.5 - 5.1 mmol/L 3.6  4.1  3.4   Chloride 98 - 111 mmol/L 113  116  116   CO2 22 - 32 mmol/L 29  26  26    Calcium  8.9 - 10.3 mg/dL 8.1  7.8  7.9     ABG    Component Value Date/Time   PHART 7.376 03/24/2024 1039   PCO2ART 51.5 (H) 03/24/2024 1039   PO2ART 231 (H) 03/24/2024 1039   HCO3 29.8 (H) 03/24/2024 1039   TCO2 31 03/24/2024 1039   ACIDBASEDEF 3.0 (H) 03/17/2024 2158   O2SAT 74.9 03/27/2024 0423       Alan Rayas, MD 03/27/2024 5:17 PM

## 2024-03-27 NOTE — Plan of Care (Signed)
  Problem: Nutrition: Goal: Adequate nutrition will be maintained Outcome: Progressing   Problem: Pain Managment: Goal: General experience of comfort will improve and/or be controlled Outcome: Progressing

## 2024-03-27 NOTE — Procedures (Signed)
 Bronchoscopy Procedure Note  ABRIEL GEESEY  [id number]  29-Nov-1955  Date:03/27/24  Time:12:29 PM   Provider Performing:Jacori Mulrooney C Claudene   Procedure(s):  Flexible Bronchoscopy [phone]), Flexible bronchoscopy with bronchial alveolar lavage [phone]), and Subsequent Therapeutic Aspiration of Tracheobronchial Tree [phone]  Indication(s) Ongoing fevers, ?mucus plugging  Consent Unable to obtain consent due to emergent nature of procedure.  Anesthesia In place for ETT   Time Out Verified patient identification, verified procedure, site/side was marked, verified correct patient position, special equipment/implants available, medications/allergies/relevant history reviewed, required imaging and test results available.   Sterile Technique Usual hand hygiene, masks, gowns, and gloves were used   Procedure Description Bronchoscope advanced through ETT Direct visualization of trach placement with balloon entirely within tracheal lumen Post trach, noted mucus plugging thick mucopurulent secretions R>L completely occlusive. Post suctioning had ongoing oozing from R bronchus intermedius medial wall at old tumor site that was radiated Treated with combination topical lido/epi plus cold saline with what appears to be good clot formation.  No suctioning for a few hours, add TXA nebs. Will do relook in 2 hours.   Complications/Tolerance See above Chest X-ray is not needed post procedure.   EBL Minimal   Specimen(s) None

## 2024-03-28 ENCOUNTER — Inpatient Hospital Stay (HOSPITAL_COMMUNITY)

## 2024-03-28 DIAGNOSIS — Z930 Tracheostomy status: Secondary | ICD-10-CM

## 2024-03-28 DIAGNOSIS — J9600 Acute respiratory failure, unspecified whether with hypoxia or hypercapnia: Secondary | ICD-10-CM | POA: Diagnosis not present

## 2024-03-28 DIAGNOSIS — I214 Non-ST elevation (NSTEMI) myocardial infarction: Secondary | ICD-10-CM | POA: Diagnosis not present

## 2024-03-28 DIAGNOSIS — R509 Fever, unspecified: Secondary | ICD-10-CM | POA: Diagnosis not present

## 2024-03-28 DIAGNOSIS — I5021 Acute systolic (congestive) heart failure: Secondary | ICD-10-CM | POA: Diagnosis not present

## 2024-03-28 DIAGNOSIS — J9601 Acute respiratory failure with hypoxia: Secondary | ICD-10-CM | POA: Diagnosis not present

## 2024-03-28 LAB — BASIC METABOLIC PANEL WITH GFR
Anion gap: 8 (ref 5–15)
BUN: 76 mg/dL — ABNORMAL HIGH (ref 8–23)
CO2: 29 mmol/L (ref 22–32)
Calcium: 7.8 mg/dL — ABNORMAL LOW (ref 8.9–10.3)
Chloride: 112 mmol/L — ABNORMAL HIGH (ref 98–111)
Creatinine, Ser: 0.98 mg/dL (ref 0.61–1.24)
GFR, Estimated: >60 mL/min (ref >60)
Glucose, Bld: 136 mg/dL — ABNORMAL HIGH (ref 70–99)
Potassium: 3.9 mmol/L (ref 3.5–5.1)
Sodium: 149 mmol/L — ABNORMAL HIGH (ref 135–145)

## 2024-03-28 LAB — CBC
HCT: 25.6 % — ABNORMAL LOW (ref 39.0–52.0)
Hemoglobin: 7.8 g/dL — ABNORMAL LOW (ref 13.0–17.0)
MCH: 30.5 pg (ref 26.0–34.0)
MCHC: 30.5 g/dL (ref 30.0–36.0)
MCV: 100 fL (ref 80.0–100.0)
Platelets: 159 K/uL (ref 150–400)
RBC: 2.56 MIL/uL — ABNORMAL LOW (ref 4.22–5.81)
RDW: 16.4 % — ABNORMAL HIGH (ref 11.5–15.5)
WBC: 12.1 K/uL — ABNORMAL HIGH (ref 4.0–10.5)
nRBC: 0.2 % (ref 0.0–0.2)

## 2024-03-28 LAB — BODY FLUID CULTURE W GRAM STAIN: Culture: NO GROWTH

## 2024-03-28 LAB — GLUCOSE, CAPILLARY
Glucose-Capillary: 127 mg/dL — ABNORMAL HIGH (ref 70–99)
Glucose-Capillary: 137 mg/dL — ABNORMAL HIGH (ref 70–99)
Glucose-Capillary: 124 mg/dL — ABNORMAL HIGH (ref 70–99)
Glucose-Capillary: 140 mg/dL — ABNORMAL HIGH (ref 70–99)
Glucose-Capillary: 126 mg/dL — ABNORMAL HIGH (ref 70–99)
Glucose-Capillary: 130 mg/dL — ABNORMAL HIGH (ref 70–99)

## 2024-03-28 LAB — CYTOLOGY - NON PAP

## 2024-03-28 LAB — COOXEMETRY PANEL
Carboxyhemoglobin: 2 % — ABNORMAL HIGH (ref 0.5–1.5)
Methemoglobin: <0.7 % (ref 0.0–1.5)
O2 Saturation: 71.9 %
Total hemoglobin: 7.8 g/dL — ABNORMAL LOW (ref 12.0–16.0)

## 2024-03-28 LAB — MAGNESIUM: Magnesium: 2.8 mg/dL — ABNORMAL HIGH (ref 1.7–2.4)

## 2024-03-28 LAB — TRIGLYCERIDES: Triglycerides: 61 mg/dL (ref <150)

## 2024-03-28 MED ORDER — SODIUM CHLORIDE 0.9 % IV SOLN
150.0000 mg | INTRAVENOUS | Status: DC
  Administered 2024-03-28: 150 mg via INTRAVENOUS
  Filled 2024-03-28: qty 7.5

## 2024-03-28 MED ORDER — FENTANYL CITRATE PF 50 MCG/ML IJ SOSY
25.0000 ug | PREFILLED_SYRINGE | INTRAMUSCULAR | Status: DC | PRN
  Administered 2024-03-28 – 2024-03-29 (×2): 50 ug via INTRAVENOUS
  Filled 2024-03-28 (×2): qty 1

## 2024-03-28 MED ORDER — FENTANYL CITRATE PF 50 MCG/ML IJ SOSY: 25.0000 ug | PREFILLED_SYRINGE | INTRAMUSCULAR | Status: DC | PRN

## 2024-03-28 MED ORDER — FAMOTIDINE 20 MG PO TABS: 20.0000 mg | ORAL_TABLET | Freq: Two times a day (BID) | ORAL | Status: DC

## 2024-03-28 MED ORDER — OXYCODONE HCL 5 MG PO TABS
5.0000 mg | ORAL_TABLET | ORAL | Status: DC | PRN
  Administered 2024-03-28 – 2024-03-31 (×5): 10 mg
  Filled 2024-03-28 (×5): qty 2

## 2024-03-28 MED ORDER — SODIUM CHLORIDE 0.9 % IV SOLN
100.0000 mg | INTRAVENOUS | Status: DC
  Administered 2024-03-29 – 2024-03-30 (×2): 100 mg via INTRAVENOUS
  Filled 2024-03-28 (×3): qty 5

## 2024-03-28 MED ORDER — FAMOTIDINE 20 MG PO TABS
20.0000 mg | ORAL_TABLET | Freq: Two times a day (BID) | ORAL | Status: DC
  Administered 2024-03-28 – 2024-04-01 (×8): 20 mg
  Filled 2024-03-28 (×8): qty 1

## 2024-03-28 MED ORDER — HEPARIN SODIUM (PORCINE) 5000 UNIT/ML IJ SOLN
5000.0000 [IU] | Freq: Three times a day (TID) | INTRAMUSCULAR | Status: DC
  Administered 2024-03-29 – 2024-04-01 (×10): 5000 [IU] via SUBCUTANEOUS
  Filled 2024-03-28 (×10): qty 1

## 2024-03-28 NOTE — Progress Notes (Signed)
 TCTS DAILY ICU PROGRESS NOTE                   [address]            [place] [address]          [phone]   11 Days Post-Op Procedure(s) (LRB): CORONARY ARTERY BYPASS GRAFTING TIMES THREE , USING LEFT INTERNAL MAMMARY ARTERY AND ENDOSCOPIC HARVESTED RIGHT SAPHENOUS VEIN (N/A) ECHOCARDIOGRAM, TRANSESOPHAGEAL, INTRAOPERATIVE (N/A) REPAIR, AORTIC DISSECTION, ASCENDING USING 30 MM HEMASHIELD PLATINUM VASCULAR GRAFT RESUSPENSION OF AORTIC VALVE  Total Length of Stay:  LOS: 18 days   Subjective: Sedated on vent  Objective: Vital signs in last 24 hours: Temp:  [98.6 F (37 C)-102.7 F (39.3 C)] 101.3 F (38.5 C) (08/15 0800) Pulse Rate:  [86-122] 91 (08/15 0800) Cardiac Rhythm: Normal sinus rhythm (08/15 0400) Resp:  [0-32] 22 (08/15 0800) BP: (94-161)/(43-67) 132/60 (08/15 0800) SpO2:  [90 %-100 %] 97 % (08/15 0800) FiO2 (%):  [40 %-100 %] 40 % (08/15 0410) Weight:  [71.6 kg] 71.6 kg (08/15 0500)  Filed Weights   03/26/24 0432 03/27/24 0500 03/28/24 0500  Weight: 75.7 kg 72.5 kg 71.6 kg    Weight change: -0.9 kg   Hemodynamic parameters for last 24 hours: CVP:  [4 mmHg-14 mmHg] 10 mmHg  Intake/Output from previous day: 08/14 0701 - 08/15 0700 In: 3278.7 [I.V.:307.4; NG/GT:1801.3; IV Piggyback:1170] Out: 3755 [Urine:3575; Chest Tube:180]  Intake/Output this shift: Total I/O In: 143.8 [I.V.:23.8; NG/GT:120] Out: 235 [Urine:235]  Current Meds: Scheduled Meds:  acetaminophen   650 mg Per Tube Q6H   amiodarone   200 mg Per Tube Daily   aspirin  EC  325 mg Oral Daily   Or   aspirin   324 mg Per Tube Daily   atorvastatin   80 mg Per Tube Daily   bisacodyl   10 mg Oral Daily   Or   bisacodyl   10 mg Rectal Daily   Chlorhexidine  Gluconate Cloth  6 each Topical Daily   docusate  100 mg Per Tube BID   enoxaparin  (LOVENOX ) injection  40 mg Subcutaneous Q24H   feeding supplement (PROSource TF20)  60 mL Per Tube TID   free water   200 mL Per Tube Q6H    insulin  aspart  0-24 Units Subcutaneous Q4H   insulin  glargine-yfgn  10 Units Subcutaneous Daily   levETIRAcetam   1,000 mg Intravenous Q12H   mouth rinse  15 mL Mouth Rinse Q2H   polyethylene glycol  17 g Per Tube Daily   sodium chloride  flush  10 mL Intrapleural Q8H   sodium chloride  flush  10-40 mL Intracatheter Q12H   sodium chloride  flush  3 mL Intravenous Q12H   spironolactone   25 mg Per Tube Daily   tranexamic acid   500 mg Nebulization Q8H   Continuous Infusions:  dexmedetomidine  (PRECEDEX ) IV infusion 0.3 mcg/kg/hr (03/28/24 0800)   famotidine  (PEPCID ) IV Stopped (03/27/24 2158)   feeding supplement (VITAL 1.5 CAL) 60 mL/hr at 03/28/24 0800   fentaNYL  infusion INTRAVENOUS 50 mcg/hr (03/28/24 0800)   levofloxacin  (LEVAQUIN ) IV Stopped (03/27/24 1443)   linezolid  (ZYVOX ) IV Stopped (03/27/24 2344)   metronidazole  Stopped (03/27/24 2230)   propofol  (DIPRIVAN ) infusion Stopped (03/27/24 0815)   PRN Meds:.fentaNYL , Gerhardt's butt cream, hydrALAZINE , ipratropium-albuterol , midazolam , ondansetron  (ZOFRAN ) IV, mouth rinse, oxyCODONE , sodium chloride  flush  General appearance: sedated on vent Heart: regular rate and rhythm Lungs: coarse, no wheeze Abdomen: soft, non tender Extremities: stable appearance of toe mottling Wound: incis healing without signs of  infection  Lab Results: CBC: Recent Labs    03/27/24 0423 03/28/24 0419  WBC 13.5* 12.1*  HGB 8.3* 7.8*  HCT 27.0* 25.6*  PLT 123* 159   BMET:  Recent Labs    03/27/24 0423 03/28/24 0419  NA 150* 149*  K 3.6 3.9  CL 113* 112*  CO2 29 29  GLUCOSE 117* 136*  BUN 50* 76*  CREATININE 0.72 0.98  CALCIUM  8.1* 7.8*    CMET: Lab Results  Component Value Date   WBC 12.1 (H) 03/28/2024   HGB 7.8 (L) 03/28/2024   HCT 25.6 (L) 03/28/2024   PLT 159 03/28/2024   GLUCOSE 136 (H) 03/28/2024   CHOL 120 03/12/2024   TRIG 61 03/28/2024   HDL 40 (L) 03/12/2024   LDLCALC 58 03/12/2024   ALT 361 (H) 03/19/2024   AST 243  (H) 03/19/2024   NA 149 (H) 03/28/2024   K 3.9 03/28/2024   CL 112 (H) 03/28/2024   CREATININE 0.98 03/28/2024   BUN 76 (H) 03/28/2024   CO2 29 03/28/2024   TSH 1.928 08/19/2020   INR 1.5 (H) 03/19/2024   HGBA1C 5.0 03/12/2024      PT/INR: No results for input(s): LABPROT, INR in the last 72 hours. Radiology: [facility] Chest Port 1 View Result Date: 03/28/2024 EXAM: 1 VIEW XRAY OF THE CHEST 03/28/2024 05:35:00 AM COMPARISON: AP radiograph of the chest dated 03/27/2024. CLINICAL HISTORY: S/P CABG (coronary artery bypass graft); Hx of adenocarcinoma of R lung (stage 3). FINDINGS: LUNGS AND PLEURA: There are hazy and streaky opacities present within the right lung base. There are also lesser hazy reticular opacities on the left. No pleural effusion. No pneumothorax. HEART AND MEDIASTINUM: The patient is again noted to be status post CABG. No acute abnormality of the cardiac and mediastinal silhouettes. BONES AND SOFT TISSUES: There is elevation of the right hemidiaphragm. No acute osseous abnormality. LINES AND TUBES: A right-sided pigtail catheter remains in place. A tracheostomy tube, left arm PICC and feeding tube are also again demonstrated. IMPRESSION: 1. Hazy and streaky opacities in the right lung base and lesser hazy reticular opacities on the left. 2. Elevation of the right hemidiaphragm. 3. Right-sided pigtail catheter, tracheostomy tube, left arm PICC, and feeding tube in place. Electronically signed by: evalene coho 03/28/2024 08:32 AM EDT RP Workstation: [id number]   DG Chest Port 1 View Result Date: 03/27/2024 EXAM: 2 VIEWS XRAY OF THE CHEST 03/27/2024 12:59:00 PM COMPARISON: 03/27/2024 05:57:00 AM CLINICAL HISTORY: Status post tracheostomy (HCC) FINDINGS: LUNGS AND PLEURA: Persistent bibasilar atelectasis. Mild pulmonary venous congestion. No focal pulmonary opacity. No pulmonary edema. No pleural effusion. No pneumothorax. HEART AND MEDIASTINUM: Median sternotomy for CABG. No acute  abnormality of the cardiac and mediastinal silhouettes. BONES AND SOFT TISSUES: No acute osseous abnormality. LINES AND TUBES: Endotracheal tube terminates 4.9 cm above carina. Feeding tube terminates beyond the inferior aspect of the film. Left-sided PICC line terminates at the superior caval/atrial junction. Right-sided pigtail pleural catheter is similar in position. IMPRESSION: 1. No residual pneumothorax with right-sided pleural catheter in place. 2.  bibasilar atelectasis with mild pulmonary venous congestion. Electronically signed by: Rockey Kilts MD 03/27/2024 01:09 PM EDT RP Workstation: [id number]     Assessment/Plan: S/P Procedure(s) (LRB): CORONARY ARTERY BYPASS GRAFTING TIMES THREE , USING LEFT INTERNAL MAMMARY ARTERY AND ENDOSCOPIC HARVESTED RIGHT SAPHENOUS VEIN (N/A) ECHOCARDIOGRAM, TRANSESOPHAGEAL, INTRAOPERATIVE (N/A) REPAIR, AORTIC DISSECTION, ASCENDING USING 30 MM HEMASHIELD PLATINUM VASCULAR GRAFT RESUSPENSION OF AORTIC VALVE  POD#11  1 Tmax 101.3, VSS,  NSR- gtts- precedex  and fentanyl - Co-Ox 71.9, on amio for NSVT- not reoccured , asa and statin. AHF managing CHF/ICM- off milrinone  2 remains sedated on vent, trach yesterday 3 CBG well controlled 4 hypernatremia stable at 149, normal creat, BUN up significantly today to 76- diuretics not ordered for today 5 leukocytosis trending a little lower on zyvox , levaquin  and flagyl  6 neuro managing CVA/seizures, currently sedated and on Keppra  7 R appears pretty stable 8 VVS managing right groin AVF and mottling- conservative watchful waiting 9 on TF's    Lemond FORBES Cera PA-C Pager [phone] 03/28/2024 8:35 AM

## 2024-03-28 NOTE — TOC Progression Note (Signed)
 Transition of Care [facility]) - Progression Note    Patient Details  Name: Alan [name] MRN: [id number] Date of Birth: Feb 17, 1956  Transition of Care [facility]) CM/SW Contact  Justina Delcia Czar, RN Phone Number: [phone] 03/28/2024, 12:11 PM  Clinical Narrative:    Spoke to wife and she decided on [facility]. Received message from [facility] rep, Ciera and wife/son completed tour. Wife has selected [facility] for [facility]. States no auth needed due to payor Medicare A/B. Plan is for transfer Monday to [facility].    Expected Discharge Plan: Home w Home Health Services Barriers to Discharge: Continued Medical Work up               Expected Discharge Plan and Services In-house Referral: NA Discharge Planning Services: CM Consult Post Acute Care Choice: Home Health Living arrangements for the past 2 months: Single Family Home                   DME Agency: NA                   Social Drivers of Health (SDOH) Interventions SDOH Screenings   Food Insecurity: No Food Insecurity (03/11/2024)  Housing: Low Risk  (03/11/2024)  Transportation Needs: No Transportation Needs (03/11/2024)  Utilities: Not At Risk (03/11/2024)  Depression (PHQ2-9): Low Risk  (10/05/2022)  Social Connections: Socially Integrated (03/10/2024)  Tobacco Use: Low Risk  (03/17/2024)    Readmission Risk Interventions     No data to display

## 2024-03-28 NOTE — Progress Notes (Signed)
 Patient ID: Alan [name], male   DOB: Sep 06, 1955, 68 y.o.   MRN: [id number]  TCTS Evening Rounds:  Hemodynamically stable in sinus rhythm.  Sedation stopped today and weaned for several hours on PS. More alert and interactive per nurse.  UO good.

## 2024-03-28 NOTE — Progress Notes (Signed)
 Advanced Heart Failure Rounding Note  Cardiologist: [name], MD   Chief Complaint: Post-op CABG and Bentall procedure   Patient Profile  69 y.o. male with history of CAD, severe MR s/p minimally invasive mitral valve repair, stage III NSCLC.   Patient admitted with NSTEMI and new systolic CHF due to ICM. Underwent 3v CABG X 3 c/b ascending aortic dissection s/p Bentall and resuspenison of AoV. Course further c/b multiple embolic brain infarcts, myoclonic seizures and H influenzae PNA.   Subjective:   8/5 S/P CABG x3 8/11 - Reintubated. S/P Bronch. Septic picture. Vascular consulted. No DVT.  R AV fistula R groin, conservative  management.  8/12 S/P R Thoracentesis. Diuresed with IV lasix .  8/14 Failed extubation, re intubated>>relook bronch showed TE fistula, s/p trach   Remains on vent via TC   C/w persistent fevers despite abx. BCx negative.   Co-ox remains stable off support, 72% CVP low ~5    Objective:   Weight Range: 71.6 kg Body mass index is 19.21 kg/m.   Vital Signs:   Temp:  [98.6 F (37 C)-102.7 F (39.3 C)] 100.6 F (38.1 C) (08/15 1000) Pulse Rate:  [86-122] 96 (08/15 1000) Resp:  [0-32] 27 (08/15 1000) BP: (94-161)/(43-75) 137/64 (08/15 1000) SpO2:  [90 %-100 %] 98 % (08/15 1000) FiO2 (%):  [40 %-100 %] 40 % (08/15 0918) Weight:  [71.6 kg] 71.6 kg (08/15 0500) Last BM Date : 03/27/24  Weight change: Filed Weights   03/26/24 0432 03/27/24 0500 03/28/24 0500  Weight: 75.7 kg 72.5 kg 71.6 kg    Intake/Output:   Intake/Output Summary (Last 24 hours) at 03/28/2024 1122 Last data filed at 03/28/2024 1000 Gross per 24 hour  Intake 3063.84 ml  Output 1960 ml  Net 1103.84 ml      Physical Exam   GENERAL: ill appearing, weak and frail. On vent via TC  ENT: + TC  Lungs- course bilaterally  CARDIAC:  JVP: not elevated          Normal rate with regular rhythm. +CTs  ABDOMEN: Soft, non-tender, non-distended.  EXTREMITIES: mottled feet  bilaterally, LEs cool to touch  GU: + foley    Telemetry   NSR 90s. Personally reviewed   Labs    CBC Recent Labs    03/27/24 0423 03/28/24 0419  WBC 13.5* 12.1*  HGB 8.3* 7.8*  HCT 27.0* 25.6*  MCV 99.3 100.0  PLT 123* 159   Basic Metabolic Panel Recent Labs    91/85/74 0423 03/28/24 0419  NA 150* 149*  K 3.6 3.9  CL 113* 112*  CO2 29 29  GLUCOSE 117* 136*  BUN 50* 76*  CREATININE 0.72 0.98  CALCIUM  8.1* 7.8*  MG 2.6* 2.8*     Medications:     Scheduled Medications:  acetaminophen   650 mg Per Tube Q6H   amiodarone   200 mg Per Tube Daily   aspirin  EC  325 mg Oral Daily   Or   aspirin   324 mg Per Tube Daily   atorvastatin   80 mg Per Tube Daily   bisacodyl   10 mg Oral Daily   Or   bisacodyl   10 mg Rectal Daily   Chlorhexidine  Gluconate Cloth  6 each Topical Daily   docusate  100 mg Per Tube BID   enoxaparin  (LOVENOX ) injection  40 mg Subcutaneous Q24H   feeding supplement (PROSource TF20)  60 mL Per Tube TID   free water   200 mL Per Tube Q6H   insulin   aspart  0-24 Units Subcutaneous Q4H   insulin  glargine-yfgn  10 Units Subcutaneous Daily   levETIRAcetam   1,000 mg Intravenous Q12H   mouth rinse  15 mL Mouth Rinse Q2H   polyethylene glycol  17 g Per Tube Daily   sodium chloride  flush  10 mL Intrapleural Q8H   sodium chloride  flush  10-40 mL Intracatheter Q12H   sodium chloride  flush  3 mL Intravenous Q12H   spironolactone   25 mg Per Tube Daily   tranexamic acid   500 mg Nebulization Q8H    Infusions:  dexmedetomidine  (PRECEDEX ) IV infusion Stopped (03/28/24 0903)   famotidine  (PEPCID ) IV 100 mL/hr at 03/28/24 1000   feeding supplement (VITAL 1.5 CAL) 60 mL/hr at 03/28/24 1000   fentaNYL  infusion INTRAVENOUS Stopped (03/28/24 0903)   levofloxacin  (LEVAQUIN ) IV Stopped (03/27/24 1443)   linezolid  (ZYVOX ) IV Stopped (03/27/24 2344)   metronidazole  500 mg (03/28/24 1016)   micafungin  (MYCAMINE ) 150 mg in sodium chloride  0.9 % 100 mL IVPB      propofol  (DIPRIVAN ) infusion Stopped (03/27/24 0815)    PRN Medications: fentaNYL , Gerhardt's butt cream, hydrALAZINE , ipratropium-albuterol , midazolam , ondansetron  (ZOFRAN ) IV, mouth rinse, oxyCODONE , sodium chloride  flush    Assessment/Plan   NSTEMI with known CAD - Remote anterior STEMI with prior stents to LAD - NSTEMI - 3 V CAD on cath with 99% in-stent restenosis in LAD, 50% residual stenosis post PTCA - CABG X 3 (LIMA to LAD, SVG to OM, SBVG to PDA) 03/17/24 as above - Aspirin  + statin.   Ascending Aortic Dissection  - complication of CABG - s/p Bentall and resuspenison of AoV    Acute systolic CHF/ICM - Newly reduced EF - Echo 03/10/24: EF 30-35%, anterior WMA, RV mildly reduced - Post CABG limited echo 8/7: EF improving, 40-45% w/ septal dyssynchrony  - Stable off Milrinone . Co-ox 75%, CVP low ~5. - Hold diuretics today   - Continue 25 mg spiro daily   Acute hypoxic resp failure/ Suspected PNA  - Tracheal aspirate with parainfluenzae + GPCs - Failed extubation x 2, now s/p trach 8/14 - trach + vent management per CCM  - covering w/ linezolid / flagyl /levofloxacin   - s/p Bronch 8/14, BAL Cx pending   Persistent Fevers - C/w persistent fevers despite abx  - Blood cultures- NGTD    - Initial Respiratory CX- Rare gram +  cocci - Bronch 8/14 showed TE fistula, ? If source - continue abx per above - d/w CCM, will empirically start antifungal coverage, Micafungin  (start date 8/15)    Severe MR s/p minimally invasive mitral valve repair - trivial MR with mean gradient of 4 mmhg on echo this admit    Postop anemia/thrombocytopenia - Multiple blood products in OR and immediately post-op - Hgb 7.8 today - Platelets improved, now normal  - Now on heparin  for DVT ppx  NSVT - NO recurrence.  - Continue po amio.     NSCLC w/bone mets - S/p chemoradiation and immunotherapy - Stable disease on most recent outpatinet imaging. Followed by Heme/Onc - Possible new  sclerotic lesion/small LUL nodule on CXR this admission. Will eventually require CT.   Myoclonic seizures - Resolved. Suspect hypoxic/anoxic brain injury - Neurology following - Off sedation and following commands.  - Keppra  - MRI brain with multiple small infarcts   Left pneumothorax - per CT surgery keep left chest tube    Lower Extremities Mottling - Atheroembolic event to both feet  - VVS consulted.  - US  R groin -AV Fistula. No plan  for intervention at this time.   [facility] Palliative Care following.   CRITICAL CARE Performed by: Caffie Shed   Total critical care time: 15 minutes  Critical care time was exclusive of separately billable procedures and treating other patients.  Critical care was necessary to treat or prevent imminent or life-threatening deterioration.  Critical care was time spent personally by me on the following activities: development of treatment plan with patient and/or surrogate as well as nursing, discussions with consultants, evaluation of patient's response to treatment, examination of patient, obtaining history from patient or surrogate, ordering and performing treatments and interventions, ordering and review of laboratory studies, ordering and review of radiographic studies, pulse oximetry and re-evaluation of patient's condition.    Caffie Shed, PA-C 03/28/2024

## 2024-03-28 NOTE — Plan of Care (Signed)
  Problem: Cardiovascular: Goal: Ability to achieve and maintain adequate cardiovascular perfusion will improve Outcome: Progressing   

## 2024-03-28 NOTE — Plan of Care (Signed)
  Problem: Pain Managment: Goal: General experience of comfort will improve and/or be controlled Outcome: Progressing   Problem: Nutritional: Goal: Maintenance of adequate nutrition will improve Outcome: Progressing

## 2024-03-28 NOTE — Plan of Care (Signed)
     Referral previously received for Alan [name] for goals of care discussion. Noted most recent palliative in-person assessment dated 03/26/2024 at which time it was noted family desires full code and full scope of care, open to tracheostomy if fails extubation, remain hopeful for improvement.  Signout was requested to chart check/out of the chart for needs.  Chart reviewed for Recent provider notes, nurse notes, TOC notes, vitals, labs, and imaging and updates received from RN.   At this time patient appears to have failed extubation and tracheostomy was subsequently placed.  Plan is now for discharge to LTAC for ongoing care.  [facility] to make a decision.  No plan for in person follow-up at this time. Please contact the palliative medicine provider on service for any new/urgent needs that require our assistance with this patient.  Thank you for your referral and allowing PMT to assist in Alan [name]'s care.   Camellia Kays, NP Palliative Medicine Team Phone: [phone]  NO CHARGE

## 2024-03-28 NOTE — Progress Notes (Signed)
 OT Cancellation Note  Patient Details Name: Alan [name] MRN: [id number] DOB: 24-Dec-1955   Cancelled Treatment:    Reason Eval/Treat Not Completed: Patient not medically ready (multiple cancels- pt medically tenuous and not ready for OT services at this time. Please reorder when pt is ready for increased activity)  Ely Molt 03/28/2024, 7:56 AM  Tenuous

## 2024-03-28 NOTE — Progress Notes (Signed)
 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  Admitted 7/28 w/ chest pain. Initially started 7/26 went to ER and CEs and EKG neg so went home but CP persisted so returned.  In ER on 28th + trop I, new T wave inversion in anterior lateral leads.  Treated w/ supplemental oxygen, morphine , started on heparin  gtt.  Went to cath lab  Findings:  99% -80% in-stent restenosis of LAD balloon angioplasty completed w/ partial restoration of flow (this re-stenosed stent was felt culprit lesion). RCA 45 to 70% stenosed, cx was 40%. Had nml LVEDP, no AS, referred to CVTS for CABG given 3V disease. and placed on plavix . EF from ECHO 30-35% w/ left regional wall motion abnormality.   Went to OR 8/4 for planned CABG x 3, as well as repair of ascending aortic dissection w/ vascular graft and repair of aortic valve  Time on bypass: ~ 4h27min EBL 1390 Cell saver 910 Received: FFP X2, PLTs 1 unit PRBC and cryo for on going surgical oozing Arrived in ICU w 480 ml bloody outpt from Chest tube Surgical team at bedside on arrival  Shortly after arrival to ICU had runs of VT and AF w/ RVR. Amiodarone  bolus and gtt initiated.  Got 2 gms calcium  another 2 units PLTs. TXA  Pertinent  Medical History  Prior CAD w/ DES X 2 and prior minimally invasive MVR 2015, NSCLC q/ mets to bone (2023) s/p chemo and XRT w/ most recent scans showing stable non-active disease  NSTEMI, HLD Significant Hospital Events: Including procedures, antibiotic start and stop dates in addition to other pertinent events   7/28 admitted w/ CP and acute inf/lat MI, cardiac cath svr 3V disease w/ re-stenosed LAD stent and  incomplete restoration of coronary flow. EF 30-35% referred for CABG 8/4 to OR. CABG x 3 v but as completing CABG noted blood loss and intra-op ECHO showed dissection of Aorta. So underwent AV graft placement and AVR. Large EBL in OR received several blood products. Immediate post op w/ large vol CT output, runs of VT and SVT. Got 2gms calcium , amiodarone  and placed on overdrive pacing via pacer critical care at bedside assisting w/ support  8/5 myoclonus; normal head CT& CTA, EEG started. Loaded with keppra .  8/6 remained on EEG, weaned off propofol  8/7 woke up, followed commands 8/8 started pneumonia antibiotics 8/9 failed SBT; milrinone  discontinued 8/10 extubated 8/11 required reintubation, bronch 8/12 R pigtail 8/14 extubation, failed, trach  Interim History / Subjective:  Off sedation starting to wake up more and nod head Fevers continue to be an issue  Objective    Blood pressure 137/64, pulse 96, temperature (!) 100.6 F (38.1 C), resp. rate (!) 27, height 6' 4 (1.93 m), weight 71.6 kg, SpO2 98%. CVP:  [4 mmHg-14 mmHg] 10 mmHg  Vent Mode: PRVC FiO2 (%):  [40 %-100 %] 40 % Set Rate:  [20 bmp] 20 bmp Vt Set:  [620 mL] 620 mL PEEP:  [5 cmH20] 5 cmH20 Pressure Support:  [8 cmH20] 8 cmH20 Plateau Pressure:  [14 cmH20-18 cmH20] 18 cmH20   Intake/Output Summary (Last 24 hours) at 03/28/2024 1233 Last  data filed at 03/28/2024 1158 Gross per 24 hour  Intake 3077.11 ml  Output 2130 ml  Net 947.11 ml   Filed Weights   03/26/24 0432 03/27/24 0500 03/28/24 0500  Weight: 75.7 kg 72.5 kg 71.6 kg    Examination: Profoundly weak Stable ischemic changes of hands and feet Able to nod head not really moving ext much Triggering vent Some oozing along inferior trach edge Minimal pigtail output no air leak Sternotomy healing okay Mild anasarca and muscle wasting  noted  Plts improving WBC a little better Sodium stable Cr up slightly  Resolved problem list  Post operative  acute blood loss w/ consumptive coagulopathy & thrombocytopenia following prolonged CPB time  right hemothorax> resolved Post-op VT; not recurrent Prolonged Qtc- on 8/9 Thrombocytopenia- improving H/o HLD, HLD and CAD w/ severe mitral valve disease. Prior stents and mini- mitral valve repair (2015) for severe MR. Acute hypoxic respiratory failure with H.flu PNA - failed extubation 8/10, reintubated 8/11. Acute on chronic HFrEF due to ICM with cardiogenic shock improved but volume up Bilateral PTX- look better on CXR 8/13, both pleural spaces with tubes in place Assessment and Plan   3V CAD and restenosis of DES now s/p CABG x 3 V w/ intraoperative finding of ascending aorta dissection requiring aortic graft and AVR Persistent resp failure- some combination aspiration and HCAP.  Does have a defect in posterior membrane of upper trachea thought to be related to ?intubation.  Unclear but looked old.  Trach placed 8/14 and bypasses this defect.  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 Last PET w/ no active disease- residual friable tissue that bleeds with any suctioning (ie during bronch) Fevers- ongoing issue; antifungal coverage added 8/15 Hypernatremia- related to rescuscitation, diuretics Small embolic strokes, Twitching L>R 8/13- repeat EEG and CT reassuring Muscular deconditioning POA- going to be biggest issue going forward Atheroembolic ext mottling- nothing acute to do R groin AV fistula- can be addressed at later time if/when he recovers  - Linezolid , flagyl , micafungin , levaquin ; monitor fever curve and f/u repeat BAL cultures 8/14 - PS and TC if tolerates former - Clamp R pigtail, remove if lung up tomorrow - Limit sedation to PRN - Think close to euvolemia - Once fevers under better control should be stable to go to [facility] for ongoing vent wean, will d/w Dr. Kerrin - Probably would benefit from adjustable length bivona given proximity of XLT distal end to the  more apical tracheal defect to prevent migration - Final outlook unclear given severity of weakness - Wife updated at length  42 min cc time  Rolan Sharps MD PCCM

## 2024-03-29 ENCOUNTER — Inpatient Hospital Stay (HOSPITAL_COMMUNITY)

## 2024-03-29 DIAGNOSIS — Z515 Encounter for palliative care: Secondary | ICD-10-CM

## 2024-03-29 DIAGNOSIS — I214 Non-ST elevation (NSTEMI) myocardial infarction: Secondary | ICD-10-CM | POA: Diagnosis not present

## 2024-03-29 DIAGNOSIS — J90 Pleural effusion, not elsewhere classified: Secondary | ICD-10-CM | POA: Diagnosis not present

## 2024-03-29 DIAGNOSIS — J9600 Acute respiratory failure, unspecified whether with hypoxia or hypercapnia: Secondary | ICD-10-CM | POA: Diagnosis not present

## 2024-03-29 DIAGNOSIS — Z930 Tracheostomy status: Secondary | ICD-10-CM | POA: Diagnosis not present

## 2024-03-29 LAB — BASIC METABOLIC PANEL WITH GFR
Anion gap: 7 (ref 5–15)
BUN: 53 mg/dL — ABNORMAL HIGH (ref 8–23)
CO2: 29 mmol/L (ref 22–32)
Calcium: 8.1 mg/dL — ABNORMAL LOW (ref 8.9–10.3)
Chloride: 115 mmol/L — ABNORMAL HIGH (ref 98–111)
Creatinine, Ser: 0.69 mg/dL (ref 0.61–1.24)
GFR, Estimated: >60 mL/min (ref >60)
Glucose, Bld: 143 mg/dL — ABNORMAL HIGH (ref 70–99)
Potassium: 3.5 mmol/L (ref 3.5–5.1)
Sodium: 151 mmol/L — ABNORMAL HIGH (ref 135–145)

## 2024-03-29 LAB — BODY FLUID CELL COUNT WITH DIFFERENTIAL
Eos, Fluid: 1 %
Lymphs, Fluid: 22 %
Monocyte-Macrophage-Serous Fluid: 26 % — ABNORMAL LOW (ref 50–90)
Neutrophil Count, Fluid: 51 % — ABNORMAL HIGH (ref 0–25)
Other Cells, Fluid: ABNORMAL %
Total Nucleated Cell Count, Fluid: 1092 uL — ABNORMAL HIGH (ref 0–1000)

## 2024-03-29 LAB — CULTURE, BLOOD (ROUTINE X 2)
Culture: NO GROWTH
Culture: NO GROWTH
Special Requests: ADEQUATE
Special Requests: ADEQUATE

## 2024-03-29 LAB — LACTATE DEHYDROGENASE, PLEURAL OR PERITONEAL FLUID: LD, Fluid: 322 U/L — ABNORMAL HIGH (ref 3–23)

## 2024-03-29 LAB — COOXEMETRY PANEL
Carboxyhemoglobin: 2.8 % — ABNORMAL HIGH (ref 0.5–1.5)
Methemoglobin: 0.9 % (ref 0.0–1.5)
O2 Saturation: 82.7 %
Total hemoglobin: 8.1 g/dL — ABNORMAL LOW (ref 12.0–16.0)

## 2024-03-29 LAB — CBC
HCT: 25.2 % — ABNORMAL LOW (ref 39.0–52.0)
Hemoglobin: 7.6 g/dL — ABNORMAL LOW (ref 13.0–17.0)
MCH: 30.3 pg (ref 26.0–34.0)
MCHC: 30.2 g/dL (ref 30.0–36.0)
MCV: 100.4 fL — ABNORMAL HIGH (ref 80.0–100.0)
Platelets: 178 K/uL (ref 150–400)
RBC: 2.51 MIL/uL — ABNORMAL LOW (ref 4.22–5.81)
RDW: 16.7 % — ABNORMAL HIGH (ref 11.5–15.5)
WBC: 12.7 K/uL — ABNORMAL HIGH (ref 4.0–10.5)
nRBC: 0.2 % (ref 0.0–0.2)

## 2024-03-29 LAB — GLUCOSE, CAPILLARY
Glucose-Capillary: 110 mg/dL — ABNORMAL HIGH (ref 70–99)
Glucose-Capillary: 123 mg/dL — ABNORMAL HIGH (ref 70–99)
Glucose-Capillary: 129 mg/dL — ABNORMAL HIGH (ref 70–99)
Glucose-Capillary: 114 mg/dL — ABNORMAL HIGH (ref 70–99)
Glucose-Capillary: 129 mg/dL — ABNORMAL HIGH (ref 70–99)
Glucose-Capillary: 131 mg/dL — ABNORMAL HIGH (ref 70–99)

## 2024-03-29 LAB — GLUCOSE, PLEURAL OR PERITONEAL FLUID: Glucose, Fluid: 128 mg/dL

## 2024-03-29 LAB — PROTEIN, PLEURAL OR PERITONEAL FLUID: Total protein, fluid: <3 g/dL

## 2024-03-29 MED ORDER — FUROSEMIDE 40 MG PO TABS
40.0000 mg | ORAL_TABLET | Freq: Every day | ORAL | Status: DC
  Administered 2024-03-29 – 2024-03-30 (×2): 40 mg
  Filled 2024-03-29 (×2): qty 1

## 2024-03-29 MED ORDER — FREE WATER
200.0000 mL | Status: DC
  Administered 2024-03-29 – 2024-03-30 (×5): 200 mL

## 2024-03-29 MED ORDER — METRONIDAZOLE 500 MG/100ML IV SOLN
500.0000 mg | Freq: Two times a day (BID) | INTRAVENOUS | Status: DC
  Administered 2024-03-29: 500 mg via INTRAVENOUS
  Filled 2024-03-29: qty 100

## 2024-03-29 MED ORDER — BANATROL TF EN LIQD
60.0000 mL | Freq: Two times a day (BID) | ENTERAL | Status: DC
  Administered 2024-03-29 – 2024-03-31 (×5): 60 mL
  Filled 2024-03-29 (×5): qty 60

## 2024-03-29 MED ORDER — POTASSIUM CHLORIDE 20 MEQ PO PACK
40.0000 meq | PACK | Freq: Once | ORAL | Status: AC
  Administered 2024-03-29: 40 meq
  Filled 2024-03-29: qty 2

## 2024-03-29 MED ORDER — LINEZOLID 600 MG/300ML IV SOLN
600.0000 mg | Freq: Two times a day (BID) | INTRAVENOUS | Status: DC
  Administered 2024-03-29 – 2024-04-01 (×6): 600 mg via INTRAVENOUS
  Filled 2024-03-29 (×7): qty 300

## 2024-03-29 NOTE — Progress Notes (Signed)
 Advanced Heart Failure Rounding Note  Cardiologist: [name], MD   Chief Complaint: Post-op CABG and Bentall procedure   Patient Profile  68 y.o. male with history of CAD, severe MR s/p minimally invasive mitral valve repair, stage III NSCLC.   Patient admitted with NSTEMI and new systolic CHF due to ICM. Underwent 3v CABG X 3 c/b ascending aortic dissection s/p Bentall and resuspenison of AoV. Course further c/b multiple embolic brain infarcts, myoclonic seizures and H influenzae PNA.   Subjective:   8/5 S/P CABG x3 8/11 - Reintubated. S/P Bronch. Septic picture. Vascular consulted. No DVT.  R AV fistula R groin, conservative  management.  8/12 S/P R Thoracentesis. Diuresed with IV lasix .  8/14 Failed extubation, re intubated>>relook bronch showed TE fistula, s/p trach   Remains on vent via TC. Eyes open but wont track or follow commands.   Still with fevers despite abx. Tmax 101.3 BCx negative.   Coox 83% CVP 7-8  Underwent left chest tube today     Objective:   Weight Range: 71.6 kg Body mass index is 19.21 kg/m.   Vital Signs:   Temp:  [97.8 F (36.6 C)-101.3 F (38.5 C)] 98.6 F (37 C) (08/16 1600) Pulse Rate:  [92-108] 107 (08/16 1800) Resp:  [11-29] 29 (08/16 1800) BP: (130-168)/(48-77) 153/61 (08/16 1800) SpO2:  [95 %-100 %] 97 % (08/16 1800) FiO2 (%):  [40 %] 40 % (08/16 1604) Last BM Date : 03/29/24  Weight change: Filed Weights   03/26/24 0432 03/27/24 0500 03/28/24 0500  Weight: 75.7 kg 72.5 kg 71.6 kg    Intake/Output:   Intake/Output Summary (Last 24 hours) at 03/29/2024 1849 Last data filed at 03/29/2024 1800 Gross per 24 hour  Intake 3845.1 ml  Output 3855 ml  Net -9.9 ml      Physical Exam   General:  terminally-ill appearing HEENT:+ cor-trak Neck: + Trach collar supple Cor: Regular rate & rhythm. No rubs, gallops or murmurs. Lungs: clear Abdomen: soft, nontender, nondistended. No hepatosplenomegaly. No bruits or masses.  Good bowel sounds. Extremities: no cyanosis, clubbing, rash, edema Neuro: eyes open but wont track or follow commands   Telemetry   Sinus 95-110 Personally reviewed   Labs    CBC Recent Labs    03/28/24 0419 03/29/24 0525  WBC 12.1* 12.7*  HGB 7.8* 7.6*  HCT 25.6* 25.2*  MCV 100.0 100.4*  PLT 159 178   Basic Metabolic Panel Recent Labs    91/85/74 0423 03/28/24 0419 03/29/24 0740  NA 150* 149* 151*  K 3.6 3.9 3.5  CL 113* 112* 115*  CO2 29 29 29   GLUCOSE 117* 136* 143*  BUN 50* 76* 53*  CREATININE 0.72 0.98 0.69  CALCIUM  8.1* 7.8* 8.1*  MG 2.6* 2.8*  --      Medications:     Scheduled Medications:  acetaminophen   650 mg Per Tube Q6H   amiodarone   200 mg Per Tube Daily   aspirin  EC  325 mg Oral Daily   Or   aspirin   324 mg Per Tube Daily   atorvastatin   80 mg Per Tube Daily   bisacodyl   10 mg Oral Daily   Or   bisacodyl   10 mg Rectal Daily   Chlorhexidine  Gluconate Cloth  6 each Topical Daily   docusate  100 mg Per Tube BID   famotidine   20 mg Per Tube BID   feeding supplement (PROSource TF20)  60 mL Per Tube TID   fiber supplement (BANATROL  TF)  60 mL Per Tube BID   free water   200 mL Per Tube Q4H   furosemide   40 mg Per Tube Daily   heparin  injection (subcutaneous)  5,000 Units Subcutaneous Q8H   insulin  aspart  0-24 Units Subcutaneous Q4H   insulin  glargine-yfgn  10 Units Subcutaneous Daily   levETIRAcetam   1,000 mg Intravenous Q12H   mouth rinse  15 mL Mouth Rinse Q2H   polyethylene glycol  17 g Per Tube Daily   sodium chloride  flush  10 mL Intrapleural Q8H   sodium chloride  flush  10-40 mL Intracatheter Q12H   sodium chloride  flush  3 mL Intravenous Q12H   spironolactone   25 mg Per Tube Daily   tranexamic acid   500 mg Nebulization Q8H    Infusions:  feeding supplement (VITAL 1.5 CAL) 60 mL/hr at 03/29/24 1800   levofloxacin  (LEVAQUIN ) IV Stopped (03/29/24 1320)   linezolid  (ZYVOX ) IV     metronidazole      micafungin  (MYCAMINE ) 100 mg  in sodium chloride  0.9 % 100 mL IVPB Stopped (03/29/24 1057)   propofol  (DIPRIVAN ) infusion Stopped (03/27/24 0815)    PRN Medications: fentaNYL  (SUBLIMAZE ) injection, Gerhardt's butt cream, hydrALAZINE , ipratropium-albuterol , midazolam , ondansetron  (ZOFRAN ) IV, mouth rinse, oxyCODONE , sodium chloride  flush    Assessment/Plan   Neuro/unresponsiveness  - not responsive - likely mix of anoxic injury/delirium possible developing sepsis - overall prognosis poor   Persistent Fevers - C/w persistent fevers despite abx  - Blood cultures- NGTD    - Initial Respiratory CX- Rare gram +  cocci - Bronch 8/14 showed TE fistula, ? If source - empirically started antifungal coverage, Micafungin  (start date 8/15)  - persistent fevers and rising co-ox raise concern for developing septic physiology  NSTEMI with known CAD s/p CABG this admi - Remote anterior STEMI with prior stents to LAD - NSTEMI - 3 V CAD on cath with 99% in-stent restenosis in LAD, 50% residual stenosis post PTCA - CABG X 3 (LIMA to LAD, SVG to OM, SBVG to PDA) 03/17/24 as above - Aspirin  + statin.  - No change  Ascending Aortic Dissection  - complication of CABG - s/p Bentall and resuspenison of AoV  - No change   Acute systolic CHF/ICM - Newly reduced EF - Echo 03/10/24: EF 30-35%, anterior WMA, RV mildly reduced - Post CABG limited echo 8/7: EF improving, 40-45% w/ septal dyssynchrony  - Stable off Milrinone . Co-ox 83%  - Continue 25 mg spiro daily   Acute hypoxic resp failure/ Suspected PNA  - Failed extubation x 2, now s/p trach 8/14 - trach + vent management per CCM  - covering w/ linezolid / flagyl /levofloxacin   - s/p Bronch 8/14, BAL Cx unrevealing so far  Severe MR s/p minimally invasive mitral valve repair - trivial MR with mean gradient of 4 mmhg on echo this admit    Postop anemia/thrombocytopenia - Multiple blood products in OR and immediately post-op - Hgb 7.8 -> 7.6 today - Platelets improved, now  normal  - Now on heparin  for DVT ppx  NSVT - NO recurrence.  - Continue po amio.     NSCLC w/bone mets - S/p chemoradiation and immunotherapy - Stable disease on most recent outpatinet imaging. Followed by Heme/Onc - Possible new sclerotic lesion/small LUL nodule on CXR this admission. Will eventually require CT.   Left pneumothorax - per CT surgery keep left chest tube    Lower Extremities Mottling - Atheroembolic event to both feet  - VVS consulted.  - US  R groin -  AV Fistula. No plan for intervention at this time.   I do not see a pathway to a meaningful survival for him. Strongly consider transition to comfort care. We will follow from a distance. Call with questions  CRITICAL CARE Performed by: Toribio Fuel   Total critical care time: 41 minutes  Critical care time was exclusive of separately billable procedures and treating other patients.  Critical care was necessary to treat or prevent imminent or life-threatening deterioration.  Critical care was time spent personally by me on the following activities: development of treatment plan with patient and/or surrogate as well as nursing, discussions with consultants, evaluation of patient's response to treatment, examination of patient, obtaining history from patient or surrogate, ordering and performing treatments and interventions, ordering and review of laboratory studies, ordering and review of radiographic studies, pulse oximetry and re-evaluation of patient's condition.    Shaune Westfall, PA-C 03/29/2024

## 2024-03-29 NOTE — Progress Notes (Signed)
 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  Admitted 7/28 w/ chest pain. Initially started 7/26 went to ER and CEs and EKG neg so went home but CP persisted so returned.  In ER on 28th + trop I, new T wave inversion in anterior lateral leads.  Treated w/ supplemental oxygen, morphine , started on heparin  gtt.  Went to cath lab  Findings:  99% -80% in-stent restenosis of LAD balloon angioplasty completed w/ partial restoration of flow (this re-stenosed stent was felt culprit lesion). RCA 45 to 70% stenosed, cx was 40%. Had nml LVEDP, no AS, referred to CVTS for CABG given 3V disease. and placed on plavix . EF from ECHO 30-35% w/ left regional wall motion abnormality.   Went to OR 8/4 for planned CABG x 3, as well as repair of ascending aortic dissection w/ vascular graft and repair of aortic valve  Time on bypass: ~ 4h38min EBL 1390 Cell saver 910 Received: FFP X2, PLTs 1 unit PRBC and cryo for on going surgical oozing Arrived in ICU w 480 ml bloody outpt from Chest tube Surgical team at bedside on arrival  Shortly after arrival to ICU had runs of VT and AF w/ RVR. Amiodarone  bolus and gtt initiated.  Got 2 gms calcium  another 2 units PLTs. TXA  Pertinent  Medical History  Prior CAD w/ DES X 2 and prior minimally invasive MVR 2015, NSCLC q/ mets to bone (2023) s/p chemo and XRT w/ most recent scans showing stable non-active disease  NSTEMI, HLD  Significant Hospital Events: Including procedures, antibiotic start and stop dates in addition to other pertinent events   7/28 admitted w/ CP and acute inf/lat MI, cardiac cath svr 3V disease w/ re-stenosed LAD stent  and incomplete restoration of coronary flow. EF 30-35% referred for CABG 8/4 to OR. CABG x 3 v but as completing CABG noted blood loss and intra-op ECHO showed dissection of Aorta. So underwent AV graft placement and AVR. Large EBL in OR received several blood products. Immediate post op w/ large vol CT output, runs of VT and SVT. Got 2gms calcium , amiodarone  and placed on overdrive pacing via pacer critical care at bedside assisting w/ support  8/5 myoclonus; normal head CT& CTA, EEG started. Loaded with keppra .  8/6 remained on EEG, weaned off propofol  8/7 woke up, followed commands 8/8 started pneumonia antibiotics 8/9 failed SBT; milrinone  discontinued 8/10 extubated 8/11 required reintubation, bronch 8/12 R pigtail 8/14 extubation, failed, trach  Interim History / Subjective:  Working on PS trials Denies pain  Objective    Blood pressure (!) 148/58, pulse (!) 103, temperature 100.2 F (37.9 C), resp. rate (!) 22, height 6' 4 (1.93 m), weight 71.6 kg, SpO2 97%. CVP:  [2 mmHg-60 mmHg] 5 mmHg  Vent Mode: CPAP;PSV FiO2 (%):  [40 %] 40 % Set Rate:  [20 bmp] 20 bmp Vt Set:  [620 mL] 620 mL PEEP:  [5 cmH20] 5 cmH20 Pressure Support:  [8 cmH20] 8 cmH20 Plateau Pressure:  [14 cmH20-22 cmH20] 14 cmH20   Intake/Output Summary (Last 24 hours) at 03/29/2024 1059 Last data filed at 03/29/2024 1017 Gross per 24 hour  Intake 3666.84 ml  Output 3280 ml  Net 386.84 ml   Filed Weights   03/26/24 0432 03/27/24 0500 03/28/24 0500  Weight: 75.7 kg 72.5 kg 71.6 kg    Examination: Remains weak Stable ischemic changes of limbs Breath sounds rhonci, still some moderate bloody secretions Oozing around trach has eased up Trace anasarca RASS -1 Still moderate effusion, unclamped posterior tube and tried to troubleshoot but not draining; there is a more anterior pocket  Labs reviewed   Resolved problem list  Post operative acute blood loss w/ consumptive coagulopathy & thrombocytopenia  following prolonged CPB time  right hemothorax> resolved Post-op VT; not recurrent Prolonged Qtc- on 8/9 Thrombocytopenia- improving H/o HLD, HLD and CAD w/ severe mitral valve disease. Prior stents and mini- mitral valve repair (2015) for severe MR. Acute hypoxic respiratory failure with H.flu PNA - failed extubation 8/10, reintubated 8/11. Acute on chronic HFrEF due to ICM with cardiogenic shock improved but volume up Bilateral PTX- look better on CXR 8/13, both pleural spaces with tubes in place Assessment and Plan   3V CAD and restenosis of DES now s/p CABG x 3 V w/ intraoperative finding of ascending aorta dissection requiring aortic graft and AVR Persistent resp failure- some combination aspiration and HCAP.  Does have a defect in posterior membrane of upper trachea thought to be related to ?intubation.  Unclear but looked old.  Trach placed 8/14 and bypasses this defect.  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 Last PET w/ no active disease- residual friable tissue that bleeds with any suctioning (ie during bronch) Fevers- ongoing issue; antifungal coverage added 8/15 Hypernatremia- related to rescuscitation, diuretics Small embolic strokes, Twitching L>R 8/13- repeat EEG and CT reassuring Muscular deconditioning POA- going to be biggest issue going forward Atheroembolic ext mottling- nothing acute to do R groin AV fistula- can be addressed at later time if/when he recovers  - Linezolid , flagyl , micafungin , levaquin ; monitor fever curve and f/u repeat BAL cultures 8/14, still pending; in absence of data will try to have everything end in 4-5 days - PS and TC if tolerates former; still limited by WOB after a couple hours on PS - Remove posterior pigtail, place one more anterior, wife agrees with plan: going to send for cyto as this ongoing fluid is ipsilateral to his prior cancer; will also send for culture - Limit sedation to PRN - Think close to euvolemia: switching  lasix  to PO - Usual turning measures - Has long rehab road ahead of him, hope he keeps improving  35 min cc time  Rolan Sharps MD PCCM

## 2024-03-29 NOTE — Progress Notes (Signed)
 Patient ID: Alan [name], male   DOB: November 05, 1955, 68 y.o.   MRN: [id number]  TCTS Evening Rounds:  Hemodynamically stable in sinus tachy rhythm 108. Afebrile this afternoon.  Weaned on vent today.  UO good.  Right chest tube put out 970 cc today after unclamped.  Tolerating tube feeds. Bowels working.

## 2024-03-29 NOTE — Plan of Care (Signed)
  Problem: Cardiovascular: Goal: Ability to achieve and maintain adequate cardiovascular perfusion will improve Outcome: Progressing Goal: Vascular access site(s) Level 0-1 will be maintained Outcome: Progressing   Problem: Clinical Measurements: Goal: Ability to maintain clinical measurements within normal limits will improve Outcome: Progressing Goal: Diagnostic test results will improve Outcome: Progressing Goal: Respiratory complications will improve Outcome: Progressing Goal: Cardiovascular complication will be avoided Outcome: Progressing   Problem: Activity: Goal: Risk for activity intolerance will decrease Outcome: Progressing

## 2024-03-29 NOTE — Procedures (Signed)
 Insertion of Chest Tube Procedure Note  JD MCCASTER  [id number]  Dec 15, 1955  Date:03/29/24  Time:1:28 PM    Provider Performing: Toribio JAYSON Sharps   Procedure: Pleural Catheter Insertion w/ Imaging Guidance (67442)  Indication(s) Effusion  Consent Risks of the procedure as well as the alternatives and risks of each were explained to the patient and/or caregiver.  Consent for the procedure was obtained and is signed in the bedside chart  Anesthesia Topical only with 1% lidocaine     Time Out Verified patient identification, verified procedure, site/side was marked, verified correct patient position, special equipment/implants available, medications/allergies/relevant history reviewed, required imaging and test results available.   Sterile Technique Maximal sterile technique including full sterile barrier drape, hand hygiene, sterile gown, sterile gloves, mask, hair covering, sterile ultrasound probe cover (if used).   Procedure Description Ultrasound used to identify appropriate pleural anatomy for placement and overlying skin marked. Area of placement cleaned and draped in sterile fashion.  A 14 French pigtail pleural catheter was placed into the right pleural space using Seldinger technique. Appropriate return of fluid was obtained.  The tube was connected to atrium and placed on -20 cm H2O wall suction.   Complications/Tolerance None; patient tolerated the procedure well. Chest X-ray is ordered to verify placement.   EBL Minimal  Specimen(s) fluid

## 2024-03-29 NOTE — Progress Notes (Signed)
 Patient has had a urethral catheter from admission date 03/17/2024 to current date of 03/29/2024.  Catheter exchange completed on 8/14.  Discussed urethral catheter with MD Claudene.  Urethral catheter removed 1130 on 03/29/2024 for spontaneous void trial.

## 2024-03-29 NOTE — Progress Notes (Signed)
 12 Days Post-Op Procedure(s) (LRB): CORONARY ARTERY BYPASS GRAFTING TIMES THREE , USING LEFT INTERNAL MAMMARY ARTERY AND ENDOSCOPIC HARVESTED RIGHT SAPHENOUS VEIN (N/A) ECHOCARDIOGRAM, TRANSESOPHAGEAL, INTRAOPERATIVE (N/A) REPAIR, AORTIC DISSECTION, ASCENDING USING 30 MM HEMASHIELD PLATINUM VASCULAR GRAFT RESUSPENSION OF AORTIC VALVE Subjective:  Weaning on vent this am. He has been more alert, interactive and following commands per nurse.  Objective: Vital signs in last 24 hours: Temp:  [99.3 F (37.4 C)-101.3 F (38.5 C)] 100 F (37.8 C) (08/16 0902) Pulse Rate:  [90-108] 97 (08/16 0902) Cardiac Rhythm: Sinus tachycardia (08/15 2000) Resp:  [11-32] 21 (08/16 0902) BP: (116-161)/(50-80) 135/60 (08/16 0902) SpO2:  [96 %-100 %] 99 % (08/16 0902) FiO2 (%):  [40 %] 40 % (08/16 0800)  Hemodynamic parameters for last 24 hours: CVP:  [2 mmHg-60 mmHg] 5 mmHg  Intake/Output from previous day: 08/15 0701 - 08/16 0700 In: 3510 [I.V.:40.9; NG/GT:2340; IV Piggyback:1129.1] Out: 3200 [Urine:3200] Intake/Output this shift: Total I/O In: 130 [NG/GT:120; IV Piggyback:10] Out: 165 [Urine:165]  General appearance: looks comfortable on vent Neurologic: opens eyes and nodding. Heart: regular rate and rhythm Lungs: clear to auscultation bilaterally Extremities: edema mild, ecchymosis of forefoot bilaterally worse on left side. Wound: chest incision healing well.  Lab Results: Recent Labs    03/28/24 0419 03/29/24 0525  WBC 12.1* 12.7*  HGB 7.8* 7.6*  HCT 25.6* 25.2*  PLT 159 178   BMET:  Recent Labs    03/28/24 0419 03/29/24 0740  NA 149* 151*  K 3.9 3.5  CL 112* 115*  CO2 29 29  GLUCOSE 136* 143*  BUN 76* 53*  CREATININE 0.98 0.69  CALCIUM  7.8* 8.1*    PT/INR: No results for input(s): LABPROT, INR in the last 72 hours. ABG    Component Value Date/Time   PHART 7.376 03/24/2024 1039   HCO3 29.8 (H) 03/24/2024 1039   TCO2 31 03/24/2024 1039   ACIDBASEDEF 3.0 (H)  03/17/2024 2158   O2SAT 82.7 03/29/2024 0525   CBG (last 3)  Recent Labs    03/28/24 2313 03/29/24 0338 03/29/24 0818  GLUCAP 130* 110* 123*   CXR: increased right pleural effusion and bibasilar atelectasis.  Assessment/Plan: S/P Procedure(s) (LRB): CORONARY ARTERY BYPASS GRAFTING TIMES THREE , USING LEFT INTERNAL MAMMARY ARTERY AND ENDOSCOPIC HARVESTED RIGHT SAPHENOUS VEIN (N/A) ECHOCARDIOGRAM, TRANSESOPHAGEAL, INTRAOPERATIVE (N/A) REPAIR, AORTIC DISSECTION, ASCENDING USING 30 MM HEMASHIELD PLATINUM VASCULAR GRAFT RESUSPENSION OF AORTIC VALVE  Hemodynamically stable in sinus rhythm. Co-ox 74.9.   Tmax 101.1 last pm. 100 this am. Remains on Zyvox , levaquin , flagyl  and micafungin . No new culture results. WBC stable at 12.7. Only line is PICC. Planning to remove foley.   Tolerating tube feeds well.   Vent weaning per CCM.  LOS: 19 days    Alan [name] 03/29/2024

## 2024-03-30 DIAGNOSIS — Z930 Tracheostomy status: Secondary | ICD-10-CM | POA: Diagnosis not present

## 2024-03-30 DIAGNOSIS — J90 Pleural effusion, not elsewhere classified: Secondary | ICD-10-CM | POA: Diagnosis not present

## 2024-03-30 DIAGNOSIS — I214 Non-ST elevation (NSTEMI) myocardial infarction: Secondary | ICD-10-CM | POA: Diagnosis not present

## 2024-03-30 DIAGNOSIS — J9600 Acute respiratory failure, unspecified whether with hypoxia or hypercapnia: Secondary | ICD-10-CM | POA: Diagnosis not present

## 2024-03-30 LAB — GLUCOSE, CAPILLARY
Glucose-Capillary: 108 mg/dL — ABNORMAL HIGH (ref 70–99)
Glucose-Capillary: 139 mg/dL — ABNORMAL HIGH (ref 70–99)
Glucose-Capillary: 115 mg/dL — ABNORMAL HIGH (ref 70–99)
Glucose-Capillary: 135 mg/dL — ABNORMAL HIGH (ref 70–99)
Glucose-Capillary: 122 mg/dL — ABNORMAL HIGH (ref 70–99)
Glucose-Capillary: 119 mg/dL — ABNORMAL HIGH (ref 70–99)

## 2024-03-30 LAB — CULTURE, RESPIRATORY W GRAM STAIN

## 2024-03-30 LAB — BASIC METABOLIC PANEL WITH GFR
Anion gap: 9 (ref 5–15)
BUN: 52 mg/dL — ABNORMAL HIGH (ref 8–23)
CO2: 29 mmol/L (ref 22–32)
Calcium: 8.3 mg/dL — ABNORMAL LOW (ref 8.9–10.3)
Chloride: 115 mmol/L — ABNORMAL HIGH (ref 98–111)
Creatinine, Ser: 0.78 mg/dL (ref 0.61–1.24)
GFR, Estimated: >60 mL/min (ref >60)
Glucose, Bld: 136 mg/dL — ABNORMAL HIGH (ref 70–99)
Potassium: 3.8 mmol/L (ref 3.5–5.1)
Sodium: 153 mmol/L — ABNORMAL HIGH (ref 135–145)

## 2024-03-30 LAB — CBC
HCT: 26.8 % — ABNORMAL LOW (ref 39.0–52.0)
Hemoglobin: 8 g/dL — ABNORMAL LOW (ref 13.0–17.0)
MCH: 30.3 pg (ref 26.0–34.0)
MCHC: 29.9 g/dL — ABNORMAL LOW (ref 30.0–36.0)
MCV: 101.5 fL — ABNORMAL HIGH (ref 80.0–100.0)
Platelets: 203 K/uL (ref 150–400)
RBC: 2.64 MIL/uL — ABNORMAL LOW (ref 4.22–5.81)
RDW: 17 % — ABNORMAL HIGH (ref 11.5–15.5)
WBC: 12.4 K/uL — ABNORMAL HIGH (ref 4.0–10.5)
nRBC: 0.2 % (ref 0.0–0.2)

## 2024-03-30 LAB — COOXEMETRY PANEL
Carboxyhemoglobin: 1.4 % (ref 0.5–1.5)
Methemoglobin: <0.7 % (ref 0.0–1.5)
O2 Saturation: 65.1 %
Total hemoglobin: 8.4 g/dL — ABNORMAL LOW (ref 12.0–16.0)

## 2024-03-30 MED ORDER — METOLAZONE 5 MG PO TABS
5.0000 mg | ORAL_TABLET | Freq: Once | ORAL | Status: AC
  Administered 2024-03-30: 5 mg
  Filled 2024-03-30: qty 1

## 2024-03-30 MED ORDER — FREE WATER
300.0000 mL | Status: DC
  Administered 2024-03-30 – 2024-04-01 (×11): 300 mL

## 2024-03-30 MED ORDER — BISACODYL 5 MG PO TBEC: 10.0000 mg | DELAYED_RELEASE_TABLET | Freq: Every day | ORAL | Status: DC | PRN

## 2024-03-30 MED ORDER — POLYETHYLENE GLYCOL 3350 17 G PO PACK: 17.0000 g | PACK | Freq: Every day | ORAL | Status: DC | PRN

## 2024-03-30 MED ORDER — DEXTROSE 5 % IV SOLN: 500.0000 mg | Freq: Once | INTRAVENOUS | Status: DC

## 2024-03-30 MED ORDER — BISACODYL 10 MG RE SUPP: 10.0000 mg | Freq: Every day | RECTAL | Status: DC | PRN

## 2024-03-30 NOTE — Progress Notes (Signed)
   03/30/24 0751  Oxygen Therapy/Pulse Ox  O2 Device (S)  Tracheostomy Collar  O2 Therapy Oxygen humidified  O2 Flow Rate (L/min) 12 L/min  FiO2 (%) 50 %    Pt was placed on trach collar per CCM MD. Pt has copious pink tinged, tan secretions with cuff deflation. Pt been to be tolerating TC at this time. RN made aware. RT will monitor.

## 2024-03-30 NOTE — Plan of Care (Signed)
   Palliative Medicine Inpatient Follow Up Note   A chart review has been completed.   Patient had tracheostomy place on 8/14.  Goals are for improvement(s).  Per secure chat with Dr. Claudene the PMT will sign off at this time.  If additional support should be needed in the future please do not hesitate to reach out to the team.  ______________________________________________________________________________________ Rosaline Becton [facility] Palliative Medicine Team Team Cell Phone: [phone] Please utilize secure chat with additional questions, if there is no response within 30 minutes please call the above phone number  Palliative Medicine Team providers are available by phone from 7am to 7pm daily and can be reached through the team cell phone.  Should this patient require assistance outside of these hours, please call the patient's attending physician.

## 2024-03-30 NOTE — Progress Notes (Signed)
 Patient ID: Alan [name], male   DOB: 1956/05/02, 68 y.o.   MRN: [id number]  TCTS Evening Rounds:  Hemodynamically stable  Was on TC till noon today. Back on vent.  UO good.   Right chest tube output low.

## 2024-03-30 NOTE — Progress Notes (Signed)
 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  Admitted 7/28 w/ chest pain. Initially started 7/26 went to ER and CEs and EKG neg so went home but CP persisted so returned.  In ER on 28th + trop I, new T wave inversion in anterior lateral leads.  Treated w/ supplemental oxygen, morphine , started on heparin  gtt.  Went to cath lab  Findings:  99% -80% in-stent restenosis of LAD balloon angioplasty completed w/ partial restoration of flow (this re-stenosed stent was felt culprit lesion). RCA 45 to 70% stenosed, cx was 40%. Had nml LVEDP, no AS, referred to CVTS for CABG given 3V disease. and placed on plavix . EF from ECHO 30-35% w/ left regional wall motion abnormality.   Went to OR 8/4 for planned CABG x 3, as well as repair of ascending aortic dissection w/ vascular graft and repair of aortic valve  Time on bypass: ~ 4h89min EBL 1390 Cell saver 910 Received: FFP X2, PLTs 1 unit PRBC and cryo for on going surgical oozing Arrived in ICU w 480 ml bloody outpt from Chest tube Surgical team at bedside on arrival  Shortly after arrival to ICU had runs of VT and AF w/ RVR. Amiodarone  bolus and gtt initiated.  Got 2 gms calcium  another 2 units PLTs. TXA  Pertinent  Medical History  Prior CAD w/ DES X 2 and prior minimally invasive MVR 2015, NSCLC q/ mets to bone (2023) s/p chemo and XRT w/ most recent scans showing stable non-active disease  NSTEMI, HLD  Significant Hospital Events: Including procedures, antibiotic start and stop dates in addition to other pertinent events   7/28 admitted w/ CP and acute inf/lat MI, cardiac cath svr 3V disease w/ re-stenosed LAD stent  and incomplete restoration of coronary flow. EF 30-35% referred for CABG 8/4 to OR. CABG x 3 v but as completing CABG noted blood loss and intra-op ECHO showed dissection of Aorta. So underwent AV graft placement and AVR. Large EBL in OR received several blood products. Immediate post op w/ large vol CT output, runs of VT and SVT. Got 2gms calcium , amiodarone  and placed on overdrive pacing via pacer critical care at bedside assisting w/ support  8/5 myoclonus; normal head CT& CTA, EEG started. Loaded with keppra .  8/6 remained on EEG, weaned off propofol  8/7 woke up, followed commands 8/8 started pneumonia antibiotics 8/9 failed SBT; milrinone  discontinued 8/10 extubated 8/11 required reintubation, bronch 8/12 R pigtail 8/14 extubation, failed, trach  Interim History / Subjective:  No events. Tolerated PS, to go to TC this am. Denies pain.  Objective    Blood pressure (!) 146/55, pulse (!) 101, temperature 98.8 F (37.1 C), temperature source Axillary, resp. rate (!) 29, height 6' 4 (1.93 m), weight 67 kg, SpO2 99%.    Vent Mode: PRVC FiO2 (%):  [40 %-50 %] 50 % Set Rate:  [20 bmp] 20 bmp Vt Set:  [620 mL] 620 mL PEEP:  [5 cmH20] 5 cmH20 Pressure Support:  [8 cmH20] 8 cmH20 Plateau Pressure:  [18 cmH20-20 cmH20] 20 cmH20   Intake/Output Summary (Last 24 hours) at 03/30/2024 1035 Last data filed at 03/30/2024  0800 Gross per 24 hour  Intake 3781.05 ml  Output 2925 ml  Net 856.05 ml   Filed Weights   03/27/24 0500 03/28/24 0500 03/30/24 0149  Weight: 72.5 kg 71.6 kg 67 kg    Examination: No distress Improved breath sounds on left with new pigtail ~1400out Profound weakness, thick somewhat bloody secretions, mild anasarca, ischemic digit changes stable Abd soft Pale skin  Fever curve finally improving  Resolved problem list  Post operative acute blood loss w/ consumptive coagulopathy & thrombocytopenia following prolonged CPB time  right hemothorax> resolved Post-op  VT; not recurrent Prolonged Qtc- on 8/9 Thrombocytopenia- improving H/o HLD, HLD and CAD w/ severe mitral valve disease. Prior stents and mini- mitral valve repair (2015) for severe MR. Acute hypoxic respiratory failure with H.flu PNA - failed extubation 8/10, reintubated 8/11. Acute on chronic HFrEF due to ICM with cardiogenic shock improved but volume up Bilateral PTX- look better on CXR 8/13, both pleural spaces with tubes in place Assessment and Plan   3V CAD and restenosis of DES now s/p CABG x 3 V w/ intraoperative finding of ascending aorta dissection requiring aortic graft and AVR Persistent resp failure- some combination aspiration and HCAP.   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  Looked old.  XLT placed 8/14 and bypasses this defect. Will leave alone for now H/o RLL stage IV lung adenocarcinoma w/ bone mets. Last PET w/ no active disease- residual friable tissue that bleeds with any suctioning (ie during bronch); sent cyto R effusion 8/16 Fevers- antifungal coverage added 8/15; finally getting a little better Hypernatremia- insensible losses from GI, resp tract; on FWF Small embolic strokes, Twitching L>R 8/13- repeat EEG and CT reassuring Muscular deconditioning POA- going to be biggest issue going forward Atheroembolic ext mottling- nothing acute to do R groin AV fistula- can be addressed at later time if/when he recovers  - Linezolid , micafungin , levaquin  end date 04/02/24 - TC trial this am, likely will need to rest at night - Keep R pigtail for now, fair volume output, f/u culture and cyto - Limit sedation to PRN - FWF to increase, going to hold lasix  today and give dose metolazone  - Usual turning measures - PT/OT efforts appreciated - Leaving trach as is so that apical defect, whatever it is, can heal over time - Has long rehab road  ahead of him, hope he keeps improving; tentative for LTACH early this week - Wife updated daily  33 min cc time  Rolan Sharps MD PCCM

## 2024-03-30 NOTE — Progress Notes (Signed)
 13 Days Post-Op Procedure(s) (LRB): CORONARY ARTERY BYPASS GRAFTING TIMES THREE , USING LEFT INTERNAL MAMMARY ARTERY AND ENDOSCOPIC HARVESTED RIGHT SAPHENOUS VEIN (N/A) ECHOCARDIOGRAM, TRANSESOPHAGEAL, INTRAOPERATIVE (N/A) REPAIR, AORTIC DISSECTION, ASCENDING USING 30 MM HEMASHIELD PLATINUM VASCULAR GRAFT RESUSPENSION OF AORTIC VALVE Subjective:  Stable day yesterday on PS all day. On TC this am.   Objective: Vital signs in last 24 hours: Temp:  [97.8 F (36.6 C)-100.2 F (37.9 C)] 98.8 F (37.1 C) (08/17 0338) Pulse Rate:  [97-109] 104 (08/17 0700) Cardiac Rhythm: Sinus tachycardia (08/17 0000) Resp:  [13-39] 39 (08/17 0700) BP: (135-168)/(48-66) 156/55 (08/17 0700) SpO2:  [95 %-100 %] 98 % (08/17 0700) FiO2 (%):  [40 %-50 %] 50 % (08/17 0751) Weight:  [67 kg] 67 kg (08/17 0149)  Hemodynamic parameters for last 24 hours:    Intake/Output from previous day: 08/16 0701 - 08/17 0700 In: 4005.1 [NG/GT:2920; IV Piggyback:1075.1] Out: 3180 [Urine:1990; Chest Tube:1190] Intake/Output this shift: Total I/O In: 200 [NG/GT:200] Out: -   General appearance: looks fairly comfortable on TC. Neurologic: intermittently follows commands. Heart: regular rate and rhythm Lungs: clear to auscultation bilaterally Abdomen: soft, non-tender; bowel sounds normal Extremities: no edema, purplish discoloration of forefeet unchanged. Wound: incisions healing well.  Lab Results: Recent Labs    03/29/24 0525 03/30/24 0459  WBC 12.7* 12.4*  HGB 7.6* 8.0*  HCT 25.2* 26.8*  PLT 178 203   BMET:  Recent Labs    03/29/24 0740 03/30/24 0459  NA 151* 153*  K 3.5 3.8  CL 115* 115*  CO2 29 29  GLUCOSE 143* 136*  BUN 53* 52*  CREATININE 0.69 0.78  CALCIUM  8.1* 8.3*    PT/INR: No results for input(s): LABPROT, INR in the last 72 hours. ABG    Component Value Date/Time   PHART 7.376 03/24/2024 1039   HCO3 29.8 (H) 03/24/2024 1039   TCO2 31 03/24/2024 1039   ACIDBASEDEF 3.0 (H)  03/17/2024 2158   O2SAT 65.1 03/30/2024 0459   CBG (last 3)  Recent Labs    03/29/24 2311 03/30/24 0338 03/30/24 0804  GLUCAP 131* 108* 139*    Assessment/Plan: S/P Procedure(s) (LRB): CORONARY ARTERY BYPASS GRAFTING TIMES THREE , USING LEFT INTERNAL MAMMARY ARTERY AND ENDOSCOPIC HARVESTED RIGHT SAPHENOUS VEIN (N/A) ECHOCARDIOGRAM, TRANSESOPHAGEAL, INTRAOPERATIVE (N/A) REPAIR, AORTIC DISSECTION, ASCENDING USING 30 MM HEMASHIELD PLATINUM VASCULAR GRAFT RESUSPENSION OF AORTIC VALVE  Hemodynamically stable in sinus rhythm. Co-ox 65%.   Afebrile. Remains on broad spectrum antibiotics per CCM. Right pleural fluid culture negative so far. Mild leukocytosis stable.  Tolerating TF well and bowels working.  Continuing vent wean per CCM.   LOS: 20 days    Alan [name] 03/30/2024

## 2024-03-30 NOTE — Progress Notes (Signed)
   03/30/24 1217  Oxygen Therapy/Pulse Ox  O2 Device (S)  Ventilator  O2 Therapy Oxygen  FiO2 (%) 40 %    Pt was placed back on full support ventilation. Pt hds increased WOB and tachypnea on trach collar. Will attempt again at a later time.

## 2024-03-31 ENCOUNTER — Inpatient Hospital Stay (HOSPITAL_COMMUNITY)

## 2024-03-31 DIAGNOSIS — E870 Hyperosmolality and hypernatremia: Secondary | ICD-10-CM | POA: Diagnosis not present

## 2024-03-31 DIAGNOSIS — Z930 Tracheostomy status: Secondary | ICD-10-CM | POA: Diagnosis not present

## 2024-03-31 DIAGNOSIS — I214 Non-ST elevation (NSTEMI) myocardial infarction: Secondary | ICD-10-CM | POA: Diagnosis not present

## 2024-03-31 DIAGNOSIS — R739 Hyperglycemia, unspecified: Secondary | ICD-10-CM | POA: Diagnosis not present

## 2024-03-31 LAB — COOXEMETRY PANEL
Carboxyhemoglobin: 2.1 % — ABNORMAL HIGH (ref 0.5–1.5)
Methemoglobin: <0.7 % (ref 0.0–1.5)
O2 Saturation: 72.3 %
Total hemoglobin: 8.5 g/dL — ABNORMAL LOW (ref 12.0–16.0)

## 2024-03-31 LAB — BASIC METABOLIC PANEL WITH GFR
Anion gap: 8 (ref 5–15)
BUN: 60 mg/dL — ABNORMAL HIGH (ref 8–23)
CO2: 29 mmol/L (ref 22–32)
Calcium: 8 mg/dL — ABNORMAL LOW (ref 8.9–10.3)
Chloride: 113 mmol/L — ABNORMAL HIGH (ref 98–111)
Creatinine, Ser: 0.82 mg/dL (ref 0.61–1.24)
GFR, Estimated: >60 mL/min (ref >60)
Glucose, Bld: 144 mg/dL — ABNORMAL HIGH (ref 70–99)
Potassium: 3.7 mmol/L (ref 3.5–5.1)
Sodium: 150 mmol/L — ABNORMAL HIGH (ref 135–145)

## 2024-03-31 LAB — CBC
HCT: 26.8 % — ABNORMAL LOW (ref 39.0–52.0)
Hemoglobin: 7.9 g/dL — ABNORMAL LOW (ref 13.0–17.0)
MCH: 30.3 pg (ref 26.0–34.0)
MCHC: 29.5 g/dL — ABNORMAL LOW (ref 30.0–36.0)
MCV: 102.7 fL — ABNORMAL HIGH (ref 80.0–100.0)
Platelets: 199 K/uL (ref 150–400)
RBC: 2.61 MIL/uL — ABNORMAL LOW (ref 4.22–5.81)
RDW: 17 % — ABNORMAL HIGH (ref 11.5–15.5)
WBC: 11.9 K/uL — ABNORMAL HIGH (ref 4.0–10.5)
nRBC: 0.3 % — ABNORMAL HIGH (ref 0.0–0.2)

## 2024-03-31 LAB — GLUCOSE, CAPILLARY
Glucose-Capillary: 102 mg/dL — ABNORMAL HIGH (ref 70–99)
Glucose-Capillary: 124 mg/dL — ABNORMAL HIGH (ref 70–99)
Glucose-Capillary: 152 mg/dL — ABNORMAL HIGH (ref 70–99)
Glucose-Capillary: 130 mg/dL — ABNORMAL HIGH (ref 70–99)
Glucose-Capillary: 104 mg/dL — ABNORMAL HIGH (ref 70–99)
Glucose-Capillary: 154 mg/dL — ABNORMAL HIGH (ref 70–99)

## 2024-03-31 MED ORDER — POTASSIUM CHLORIDE 20 MEQ PO PACK
40.0000 meq | PACK | Freq: Once | ORAL | Status: AC
  Administered 2024-03-31: 40 meq
  Filled 2024-03-31: qty 2

## 2024-03-31 MED ORDER — INSULIN GLARGINE-YFGN 100 UNIT/ML ~~LOC~~ SOLN: 5.0000 [IU] | Freq: Every day | SUBCUTANEOUS | Status: DC

## 2024-03-31 MED ORDER — HYDRALAZINE HCL 10 MG PO TABS
10.0000 mg | ORAL_TABLET | Freq: Three times a day (TID) | ORAL | Status: DC
  Administered 2024-03-31 – 2024-04-01 (×3): 10 mg
  Filled 2024-03-31 (×3): qty 1

## 2024-03-31 MED ORDER — BANATROL TF EN LIQD
60.0000 mL | Freq: Four times a day (QID) | ENTERAL | Status: DC
  Administered 2024-03-31 – 2024-04-01 (×4): 60 mL
  Filled 2024-03-31 (×4): qty 60

## 2024-03-31 MED ORDER — ACETAMINOPHEN 160 MG/5ML PO SOLN
650.0000 mg | Freq: Four times a day (QID) | ORAL | Status: DC
  Administered 2024-03-31 – 2024-04-01 (×3): 650 mg
  Filled 2024-03-31 (×3): qty 20.3

## 2024-03-31 MED ORDER — INSULIN GLARGINE 100 UNIT/ML ~~LOC~~ SOLN
5.0000 [IU] | Freq: Every day | SUBCUTANEOUS | Status: DC
  Administered 2024-03-31 – 2024-04-01 (×2): 5 [IU] via SUBCUTANEOUS
  Filled 2024-03-31 (×2): qty 0.05

## 2024-03-31 MED ORDER — LOPERAMIDE HCL 1 MG/7.5ML PO SUSP
2.0000 mg | ORAL | Status: DC | PRN
  Administered 2024-03-31 – 2024-04-01 (×2): 2 mg
  Filled 2024-03-31 (×3): qty 15

## 2024-03-31 MED ORDER — ACETAMINOPHEN 325 MG PO TABS: 650.0000 mg | ORAL_TABLET | Freq: Four times a day (QID) | ORAL | Status: DC

## 2024-03-31 MED ORDER — LOPERAMIDE HCL 1 MG/7.5ML PO SUSP
2.0000 mg | Freq: Once | ORAL | Status: AC
  Administered 2024-03-31: 2 mg
  Filled 2024-03-31: qty 15

## 2024-03-31 NOTE — Progress Notes (Signed)
 RT NOTE: patient was placed back on full support ventilation by MD upon arrival to patient's room for AM assessment.  Tolerating well at this time.  Will continue to monitor.

## 2024-03-31 NOTE — TOC Progression Note (Signed)
 Transition of Care [facility]) - Progression Note    Patient Details  Name: Alan [name] MRN: [id number] Date of Birth: 1955-09-01  Transition [facility]) CM/SW Contact  Justina Delcia Czar, RN Phone Number: [phone] 03/31/2024, 4:35 PM  Clinical Narrative:     Received notification for [facility] rep, Ciera. Waiting pt medical stability to transfer to LTAC.   Will continue to follow for dc needs.   Expected Discharge Plan: Home w Home Health Services Barriers to Discharge: Continued Medical Work up    Expected Discharge Plan and Services In-house Referral: NA Discharge Planning Services: CM Consult Post Acute Care Choice: Home Health Living arrangements for the past 2 months: Single Family Home                   DME Agency: NA                   Social Drivers of Health (SDOH) Interventions SDOH Screenings   Food Insecurity: No Food Insecurity (03/11/2024)  Housing: Low Risk  (03/11/2024)  Transportation Needs: No Transportation Needs (03/11/2024)  Utilities: Not At Risk (03/11/2024)  Depression (PHQ2-9): Low Risk  (10/05/2022)  Social Connections: Socially Integrated (03/10/2024)  Tobacco Use: Low Risk  (03/17/2024)    Readmission Risk Interventions     No data to display

## 2024-03-31 NOTE — Progress Notes (Signed)
 EVENING ROUNDS NOTE :     [address]       [place] [address]             [phone]                 14 Days Post-Op Procedure(s) (LRB): CORONARY ARTERY BYPASS GRAFTING TIMES THREE , USING LEFT INTERNAL MAMMARY ARTERY AND ENDOSCOPIC HARVESTED RIGHT SAPHENOUS VEIN (N/A) ECHOCARDIOGRAM, TRANSESOPHAGEAL, INTRAOPERATIVE (N/A) REPAIR, AORTIC DISSECTION, ASCENDING USING 30 MM HEMASHIELD PLATINUM VASCULAR GRAFT RESUSPENSION OF AORTIC VALVE   Total Length of Stay:  LOS: 21 days  Events:   No events Stable day Remained vented today     BP (!) 126/53   Pulse (!) 106   Temp (!) 97.5 F (36.4 C) (Axillary)   Resp (!) 24   Ht 6' 4 (1.93 m)   Wt 69 kg   SpO2 98%   BMI 18.52 kg/m      Vent Mode: PRVC FiO2 (%):  [40 %] 40 % Set Rate:  [20 bmp] 20 bmp Vt Set:  [620 mL] 620 mL PEEP:  [5 cmH20] 5 cmH20 Pressure Support:  [10 cmH20] 10 cmH20 Plateau Pressure:  [16 cmH20-20 cmH20] 16 cmH20   feeding supplement (VITAL 1.5 CAL) 60 mL/hr at 03/31/24 1200   linezolid  (ZYVOX ) IV Stopped (03/31/24 1140)    I/O last 3 completed shifts: In: 5885.8 [I.V.:20; NG/GT:4520; IV Piggyback:1335.8] Out: 3490 [Urine:3250; Chest Tube:240]      Latest Ref Rng & Units 03/31/2024    4:54 AM 03/30/2024    4:59 AM 03/29/2024    5:25 AM  CBC  WBC 4.0 - 10.5 K/uL 11.9  12.4  12.7   Hemoglobin 13.0 - 17.0 g/dL 7.9  8.0  7.6   Hematocrit 39.0 - 52.0 % 26.8  26.8  25.2   Platelets 150 - 400 K/uL 199  203  178        Latest Ref Rng & Units 03/31/2024    4:54 AM 03/30/2024    4:59 AM 03/29/2024    7:40 AM  BMP  Glucose 70 - 99 mg/dL 855  863  856   BUN 8 - 23 mg/dL 60  52  53   Creatinine 0.61 - 1.24 mg/dL 9.17  9.21  9.30   Sodium 135 - 145 mmol/L 150  153  151   Potassium 3.5 - 5.1 mmol/L 3.7  3.8  3.5   Chloride 98 - 111 mmol/L 113  115  115   CO2 22 - 32 mmol/L 29  29  29    Calcium  8.9 - 10.3 mg/dL 8.0  8.3  8.1     ABG    Component Value Date/Time   PHART 7.376  03/24/2024 1039   PCO2ART 51.5 (H) 03/24/2024 1039   PO2ART 231 (H) 03/24/2024 1039   HCO3 29.8 (H) 03/24/2024 1039   TCO2 31 03/24/2024 1039   ACIDBASEDEF 3.0 (H) 03/17/2024 2158   O2SAT 72.3 03/31/2024 0454       Linnie Rayas, MD 03/31/2024 4:00 PM

## 2024-03-31 NOTE — Progress Notes (Signed)
 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  Admitted 7/28 w/ chest pain. Initially started 7/26 went to ER and CEs and EKG neg so went home but CP persisted so returned.  In ER on 28th + trop I, new T wave inversion in anterior lateral leads.  Treated w/ supplemental oxygen, morphine , started on heparin  gtt.  Went to cath lab  Findings:  99% -80% in-stent restenosis of LAD balloon angioplasty completed w/ partial restoration of flow (this re-stenosed stent was felt culprit lesion). RCA 45 to 70% stenosed, cx was 40%. Had nml LVEDP, no AS, referred to CVTS for CABG given 3V disease. and placed on plavix . EF from ECHO 30-35% w/ left regional wall motion abnormality.   Went to OR 8/4 for planned CABG x 3, as well as repair of ascending aortic dissection w/ vascular graft and repair of aortic valve  Time on bypass: ~ 4h45min EBL 1390 Cell saver 910 Received: FFP X2, PLTs 1 unit PRBC and cryo for on going surgical oozing Arrived in ICU w 480 ml bloody outpt from Chest tube Surgical team at bedside on arrival  Shortly after arrival to ICU had runs of VT and AF w/ RVR. Amiodarone  bolus and gtt initiated.  Got 2 gms calcium  another 2 units PLTs. TXA  Pertinent  Medical History  Prior CAD w/ DES X 2 and prior minimally invasive MVR 2015, NSCLC q/ mets to bone (2023) s/p chemo and XRT w/ most recent scans showing stable non-active disease  NSTEMI, HLD  Significant Hospital Events: Including procedures, antibiotic start and stop dates in addition to other pertinent events   7/28 admitted w/ CP and acute inf/lat MI, cardiac cath svr 3V disease w/ re-stenosed LAD stent  and incomplete restoration of coronary flow. EF 30-35% referred for CABG 8/4 to OR. CABG x 3 v but as completing CABG noted blood loss and intra-op ECHO showed dissection of Aorta. So underwent AV graft placement and AVR. Large EBL in OR received several blood products. Immediate post op w/ large vol CT output, runs of VT and SVT. Got 2gms calcium , amiodarone  and placed on overdrive pacing via pacer critical care at bedside assisting w/ support  8/5 myoclonus; normal head CT& CTA, EEG started. Loaded with keppra .  8/6 remained on EEG, weaned off propofol  8/7 woke up, followed commands 8/8 started pneumonia antibiotics 8/9 failed SBT; milrinone  discontinued 8/10 extubated 8/11 required reintubation, bronch 8/12 R pigtail 8/14 extubation, failed> trach  Interim History / Subjective:  Trach collar about 4 hours yesterday. This morning not answering questions.   Objective    Blood pressure (!) 148/58, pulse (!) 103, temperature 98.4 F (36.9 C), temperature source Axillary, resp. rate (!) 23, height 6' 4 (1.93 m), weight 69 kg, SpO2 96%.    Vent Mode: PSV FiO2 (%):  [40 %-50 %] 40 % Set Rate:  [20 bmp] 20 bmp Vt Set:  [620 mL] 620 mL PEEP:  [5 cmH20] 5 cmH20 Pressure Support:  [10 cmH20] 10 cmH20 Plateau Pressure:  [17 cmH20-22 cmH20] 17 cmH20   Intake/Output Summary (Last 24 hours) at 03/31/2024 0735 Last data filed at 03/31/2024  0600 Gross per 24 hour  Intake 3975.79 ml  Output 2590 ml  Net 1385.79 ml   Filed Weights   03/28/24 0500 03/30/24 0149 03/31/24 0500  Weight: 71.6 kg 67 kg 69 kg    Examination: Critically ill appearing man lying in bed on MV [place]/AT, NGT in place Tracheostomy, no bleeding S1S2, RRR Abd soft, NT Muscle wasting, no significant edema Not following commands, but tracks and opens eyes to verbal stimulation. Falls back asleep quickly.   Coox 72% Na+ 150 BUN 60 Cr 0.82 WBC 11.9 H/H 7.9/26.8 Platelets 199 Trach aspirate culture: staph epi, R  quinolones, clinda, Trim/Sulfa    Resolved problem list  Post operative acute blood loss w/ consumptive coagulopathy & thrombocytopenia following prolonged CPB time  right hemothorax> resolved Post-op VT; not recurrent Prolonged Qtc- on 8/9 Thrombocytopenia- improving H/o HLD, HLD and CAD w/ severe mitral valve disease. Prior stents and mini- mitral valve repair (2015) for severe MR. Acute hypoxic respiratory failure with H.flu PNA - failed extubation 8/10, reintubated 8/11. Acute on chronic HFrEF due to ICM with cardiogenic shock improved but volume up Bilateral PTX- look better on CXR 8/13, both pleural spaces with tubes in place Assessment and Plan   3V CAD and restenosis of DES now s/p CABG x 3 V w/ intraoperative finding of ascending aorta dissection requiring aortic graft and AVR Post-op cardiogenic shock resolved -post-op care per TCTS -aspirin , statin, spironolactone  -holding metoprolol   Acute resp failure- some combination aspiration and HCAP  R pleural effusion s/p chest tube -con't chest tube; if output remains low tomorrow can d/c -CXR -LTVV- back in PRVC today due to increased WOB -VAP prevention protocol -PAD protocol-- not needing -con't vent weaning efforts-- had been on 10/5, not tolerating this morning due to WOB. 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  Looked old.  XLT placed 8/14 and bypasses this defect. Will leave alone for now -potentially needs relook later  H/o RLL stage IV lung adenocarcinoma w/ bone mets. Last PET w/ no active disease- residual friable tissue that bleeds with any suctioning (ie during bronch); sent cyto R effusion 8/16 -CT not concerning for new spinal mets; redemonstrated old bony mets on CT  Fevers- antifungal  coverage added 8/15> better Staph epi pneumonia -stop antifungals today and monitor fever curve -con't linezolid ; confirmed with lab that it is susceptible  Hypernatremia, improving -con't FWF, monitor  Small embolic strokes, Twitching L>R 8/13- repeat EEG and CT reassuring Muscular deconditioning POA -PT, OT -supportive care  Atheroembolic extremity mottling -monitor  R groin AV fistula -can be addressed at later time if/when he recovers  Hypertension -start low dose enteral hydralazine   Moderate protein energy malnutrition -TF  No family at bedside during rounds. Wife has been updated daily.  This patient is critically ill with multiple organ system failure which requires frequent high complexity decision making, assessment, support, evaluation, and titration of therapies. This was completed through the application of advanced monitoring technologies and extensive interpretation of multiple databases. During this encounter critical care time was devoted to patient care services described in this note for 45 minutes.  Leita SHAUNNA Gaskins, DO 03/31/24 10:18 AM [facility]  For contact information, see Amion. If no response to pager, please call PCCM consult pager. After hours,  7PM- 7AM, please call Elink.

## 2024-03-31 NOTE — Progress Notes (Signed)
 Nutrition Follow-up  DOCUMENTATION CODES:   Non-severe (moderate) malnutrition in context of chronic illness (decreased appetite related to worsening heart issues)  INTERVENTION:   Continue tube feeding via Cortrak: - Vital 1.5 @ 60 mL/hr (1440 mL/day) - PROSource TF20 60 mL TID  Tube feeding regimen provides 2400 kcal, 157 grams of protein, and 1100 ml of H2O.   - Continue free water  flushes per MD, currently 300 mL every 4 hours to provide an additional 1800 mL of free water  (total free water : 2900 mL)  - Continue Banatrol TF due to ongoing diarrhea, dose recently increased from BID to QID  Recommend that pt discuss all outpatient vitamin/mineral supplement use, including use of Relief Factor and Xango Juice, with MD prior to resuming use as outpatient.   NUTRITION DIAGNOSIS:   Moderate Malnutrition related to chronic illness (decreased appetite related to worsening heart issues) as evidenced by mild fat depletion, mild muscle depletion, energy intake < 75% for > or equal to 1 month.  Ongoing, being addressed via TF  GOAL:   Patient will meet greater than or equal to 90% of their needs  Met via TF  MONITOR:   TF tolerance, Vent status, Weight trends  REASON FOR ASSESSMENT:   Consult Enteral/tube feeding initiation and management  ASSESSMENT:   68 yo male admitted with NSTEMI. PMH includes metastatic lung cancer (in remission), CAD, STEMI 2004, MVP, MVR, prior stents and mini - mitral valve repair (2015), HLD, HTN, GERD.  7/28 - admitted, Cath Lab, ECHO EF 30-35 %, RV mildly reduced 8/04 - OR: CABG x 3, repair of ascending aortic dissection (intra-op discovery) with vascular graft and repair of aortic valve 8/05 - myoclonus, CT/CTA head normal, EEG started 8/07 - post-op Echo with EF improving, 40-45% with septal dyssynchrony 8/08 - Cortrak placed 8/10 - extubated, very weak/decompensated 8/11 - re-intubated, Vascular Surgery consulted for duskiness in  LLE 8/12 - R Thoracentesis, US  R groin with evidence of AV fistula, atheroembolic event to both feet with mottling at toes (allowing for demarcation per Vascular Surgery) 8/14 - failed extubation, s/p tracheostomy, ? TE fistula seen on bronch (more likely a defect in posterior membrane of upper trachea) 8/16 - chest tube insertion 8/17 - trach collar trials for about 4 hours  Per PCCM, not not tolerating vent weaning efforts today due to WOB. Plan is to retry trach collar tomorrow. Pt currently on vent support via trach. Noted Palliative Medicine has signed off. Plan is for discharge to [facility].  Noted Banatrol TF increased from BID to QID today. Pt with multiple type 7 bowel movements over the last 24 hours. Additionally, pt remains on free water  flushes due to hypernatremia.  Weight has fluctuated between 67-87.2 kg during this admission. Weight currently at 69 kg. Will continue to closely monitor weight trends.  Admit weight: 79 kg Current weight: 69 kg  Current TF: Vital 1.5 @ 60 mL/hr, PROSource TF20 60 mL TID, free water  flushes 300 mL every 4 hours  Medications reviewed and include: pepcid  20 mg BID, Banatrol TF 60 mL QID, SSI, lantus  5 units daily, klor-con  40 mEq x 1, spironolactone , IV abx  Labs reviewed: sodium 150, chloride 113, BUN 60, WBC 11.9, hemoglobin 7.9 CBG's: 102-152 x 24 hours  Home meds include: Vitamin C  2000 mg daily Magnesium  30 mg daily CoQ10  Prostate MVI Relief Factor Supplement (contains turmeric, Omega 3s, Resveratrol, Icariin, [place], Vit D, Magnesium  and Zinc) Xango Juice (32 ounces daily: exceeds recommended dose, main ingredient  Mangosteen)  UOP: 2450 mL x 24 hours R chest tube: 140 mL x 24 hours  Diet Order:   Diet Order     None       EDUCATION NEEDS:   Not appropriate for education at this time  Skin:  Skin Assessment:  Skin Integrity Issues: Stage I: R heel, L buttock Incisions: surgical incisions to chest, R leg, L  groin Other: R wrist puncture site for cardiac cath  Last BM:  03/31/24 multiple type 7  Height:   Ht Readings from Last 1 Encounters:  03/21/24 6' 4 (1.93 m)    Weight:   Wt Readings from Last 1 Encounters:  03/31/24 69 kg    Ideal Body Weight:  91.8 kg  BMI:  Body mass index is 18.52 kg/m.  Estimated Nutritional Needs:   Kcal:  2300-2600 kcals  Protein:  140-170 grams  Fluid:  2L    Mallie Satchel, MS, RD, LDN Registered Dietitian II Please see AMiON for contact information.

## 2024-03-31 NOTE — Progress Notes (Signed)
 Wife updated over the phone.    Alan SHAUNNA Gaskins, DO 03/31/24 2:38 PM [facility]  For contact information, see Amion. If no response to pager, please call PCCM consult pager. After hours, 7PM- 7AM, please call Elink.

## 2024-03-31 NOTE — Progress Notes (Addendum)
 TCTS DAILY ICU PROGRESS NOTE                   [address]            [place] [address]          [phone]   14 Days Post-Op Procedure(s) (LRB): CORONARY ARTERY BYPASS GRAFTING TIMES THREE , USING LEFT INTERNAL MAMMARY ARTERY AND ENDOSCOPIC HARVESTED RIGHT SAPHENOUS VEIN (N/A) ECHOCARDIOGRAM, TRANSESOPHAGEAL, INTRAOPERATIVE (N/A) REPAIR, AORTIC DISSECTION, ASCENDING USING 30 MM HEMASHIELD PLATINUM VASCULAR GRAFT RESUSPENSION OF AORTIC VALVE  Total Length of Stay:  LOS: 21 days   Subjective: Sedated on vent   Objective: Vital signs in last 24 hours: Temp:  [98.3 F (36.8 C)-99.3 F (37.4 C)] 99.2 F (37.3 C) (08/18 0745) Pulse Rate:  [98-110] 106 (08/18 0800) Cardiac Rhythm: Sinus tachycardia (08/17 2000) Resp:  [19-31] 30 (08/18 0800) BP: (101-153)/(41-74) 151/52 (08/18 0800) SpO2:  [95 %-99 %] 95 % (08/18 0800) FiO2 (%):  [40 %] 40 % (08/18 0600) Weight:  [69 kg] 69 kg (08/18 0500)  Filed Weights   03/28/24 0500 03/30/24 0149 03/31/24 0500  Weight: 71.6 kg 67 kg 69 kg    Weight change: 2 kg   Hemodynamic parameters for last 24 hours:    Intake/Output from previous day: 08/17 0701 - 08/18 0700 In: 3975.8 [I.V.:20; NG/GT:3020; IV Piggyback:925.8] Out: 2590 [Urine:2450; Chest Tube:140]  Intake/Output this shift: No intake/output data recorded.  Current Meds: Scheduled Meds:  acetaminophen   650 mg Per Tube Q6H   amiodarone   200 mg Per Tube Daily   aspirin  EC  325 mg Oral Daily   Or   aspirin   324 mg Per Tube Daily   atorvastatin   80 mg Per Tube Daily   Chlorhexidine  Gluconate Cloth  6 each Topical Daily   famotidine   20 mg Per Tube BID   feeding supplement (PROSource TF20)  60 mL Per Tube TID   fiber supplement (BANATROL TF)  60 mL Per Tube BID   free water   300 mL Per Tube Q4H   heparin  injection (subcutaneous)  5,000 Units Subcutaneous Q8H   insulin  aspart  0-24 Units Subcutaneous Q4H   insulin  glargine-yfgn  10 Units Subcutaneous  Daily   levETIRAcetam   1,000 mg Intravenous Q12H   mouth rinse  15 mL Mouth Rinse Q2H   sodium chloride  flush  10 mL Intrapleural Q8H   sodium chloride  flush  10-40 mL Intracatheter Q12H   sodium chloride  flush  3 mL Intravenous Q12H   spironolactone   25 mg Per Tube Daily   Continuous Infusions:  feeding supplement (VITAL 1.5 CAL) 60 mL/hr at 03/31/24 0400   levofloxacin  (LEVAQUIN ) IV Stopped (03/30/24 1347)   linezolid  (ZYVOX ) IV Stopped (03/30/24 2229)   micafungin  (MYCAMINE ) 100 mg in sodium chloride  0.9 % 100 mL IVPB Stopped (03/30/24 1027)   propofol  (DIPRIVAN ) infusion Stopped (03/27/24 0815)   PRN Meds:.bisacodyl  **OR** bisacodyl , fentaNYL  (SUBLIMAZE ) injection, Gerhardt's butt cream, hydrALAZINE , ipratropium-albuterol , midazolam , ondansetron  (ZOFRAN ) IV, mouth rinse, oxyCODONE , polyethylene glycol, sodium chloride  flush  General appearance: no response to verbal stimuli Neurologic: sedated Heart: regular rate and rhythm Lungs: dim in lower fields Abdomen: soft Extremities: no edema feet mottling worse, + PT doppler signals bilat Wound: incis without signs of infection  Lab Results: CBC: Recent Labs    03/30/24 0459 03/31/24 0454  WBC 12.4* 11.9*  HGB 8.0* 7.9*  HCT 26.8* 26.8*  PLT 203 199   BMET:  Recent Labs  03/30/24 0459 03/31/24 0454  NA 153* 150*  K 3.8 3.7  CL 115* 113*  CO2 29 29  GLUCOSE 136* 144*  BUN 52* 60*  CREATININE 0.78 0.82  CALCIUM  8.3* 8.0*    CMET: Lab Results  Component Value Date   WBC 11.9 (H) 03/31/2024   HGB 7.9 (L) 03/31/2024   HCT 26.8 (L) 03/31/2024   PLT 199 03/31/2024   GLUCOSE 144 (H) 03/31/2024   CHOL 120 03/12/2024   TRIG 61 03/28/2024   HDL 40 (L) 03/12/2024   LDLCALC 58 03/12/2024   ALT 361 (H) 03/19/2024   AST 243 (H) 03/19/2024   NA 150 (H) 03/31/2024   K 3.7 03/31/2024   CL 113 (H) 03/31/2024   CREATININE 0.82 03/31/2024   BUN 60 (H) 03/31/2024   CO2 29 03/31/2024   TSH 1.928 08/19/2020   INR 1.5  (H) 03/19/2024   HGBA1C 5.0 03/12/2024      PT/INR: No results for input(s): LABPROT, INR in the last 72 hours. Radiology: No results found.   Assessment/Plan: S/P Procedure(s) (LRB): CORONARY ARTERY BYPASS GRAFTING TIMES THREE , USING LEFT INTERNAL MAMMARY ARTERY AND ENDOSCOPIC HARVESTED RIGHT SAPHENOUS VEIN (N/A) ECHOCARDIOGRAM, TRANSESOPHAGEAL, INTRAOPERATIVE (N/A) REPAIR, AORTIC DISSECTION, ASCENDING USING 30 MM HEMASHIELD PLATINUM VASCULAR GRAFT RESUSPENSION OF AORTIC VALVE  1 Tmax 99.2, s BP 100's-150's, sinus rhythm/tachy, no inotropes or vasopressors- Co-ox 72, on po amio for NSVT 2 back on PRVC-has been on PS for weaning attempts,  vent management as per PCCM, trached last week- on propofol  Cytology (8/12) positive for adenocarcinoma- h/o stage 4 RLL 3 fevers- improved- levaquin  , zyvox  and mycafungin- PNA 4 hypernatremia - fairly stable at 150- getting free water  5 creat normal- BUN 60- receiving diuretics 6 marked deconditioning, embolic CVA's- rehab as able 7 R groin AVF, feet mottling- monitoring conservatively 9 conts TF's 10 guarded prognosis  Lemond FORBES Cera PA-C Pager [phone] 03/31/2024 8:04 AM   Chart reviewed, patient examined, agree with above.   Not tolerating vent weaning today due to increased WOB.  Chest tube output low. CXR stable with mild right basilar atelectasis, possibly small effusion. Right pleural fluid cytology from 8/12 positive for adenocarcinoma. Repeat pending. Hx of metastatic NSCLC lllb 05/2019 treated with chemoradiation, immunotherapy in 08/2020 and with bone mets on PET 10/2021, s/p palliative XRT.

## 2024-04-01 ENCOUNTER — Inpatient Hospital Stay (HOSPITAL_COMMUNITY)

## 2024-04-01 ENCOUNTER — Institutional Professional Consult (permissible substitution) (HOSPITAL_COMMUNITY)

## 2024-04-01 ENCOUNTER — Institutional Professional Consult (permissible substitution)
Admission: RE | Admit: 2024-04-01 | Discharge: 2024-04-14 | Disposition: E | Attending: Internal Medicine | Admitting: Internal Medicine

## 2024-04-01 DIAGNOSIS — E440 Moderate protein-calorie malnutrition: Secondary | ICD-10-CM

## 2024-04-01 DIAGNOSIS — J9600 Acute respiratory failure, unspecified whether with hypoxia or hypercapnia: Secondary | ICD-10-CM | POA: Diagnosis not present

## 2024-04-01 DIAGNOSIS — J90 Pleural effusion, not elsewhere classified: Secondary | ICD-10-CM | POA: Diagnosis not present

## 2024-04-01 DIAGNOSIS — E870 Hyperosmolality and hypernatremia: Secondary | ICD-10-CM | POA: Diagnosis not present

## 2024-04-01 LAB — CBC
HCT: 24.5 % — ABNORMAL LOW (ref 39.0–52.0)
Hemoglobin: 7.4 g/dL — ABNORMAL LOW (ref 13.0–17.0)
MCH: 30.6 pg (ref 26.0–34.0)
MCHC: 30.2 g/dL (ref 30.0–36.0)
MCV: 101.2 fL — ABNORMAL HIGH (ref 80.0–100.0)
Platelets: 183 K/uL (ref 150–400)
RBC: 2.42 MIL/uL — ABNORMAL LOW (ref 4.22–5.81)
RDW: 17.1 % — ABNORMAL HIGH (ref 11.5–15.5)
WBC: 10.1 K/uL (ref 4.0–10.5)
nRBC: 0.4 % — ABNORMAL HIGH (ref 0.0–0.2)

## 2024-04-01 LAB — C DIFFICILE QUICK SCREEN W PCR REFLEX
C Diff antigen: NEGATIVE
C Diff interpretation: NOT DETECTED
C Diff toxin: NEGATIVE

## 2024-04-01 LAB — BLOOD GAS, ARTERIAL
Acid-Base Excess: 6.1 mmol/L — ABNORMAL HIGH (ref 0.0–2.0)
Bicarbonate: 29.4 mmol/L — ABNORMAL HIGH (ref 20.0–28.0)
O2 Saturation: 98.5 %
Patient temperature: 36.9
pCO2 arterial: 36 mmHg (ref 32–48)
pH, Arterial: 7.52 — ABNORMAL HIGH (ref 7.35–7.45)
pO2, Arterial: 106 mmHg (ref 83–108)

## 2024-04-01 LAB — BODY FLUID CULTURE W GRAM STAIN: Culture: NO GROWTH

## 2024-04-01 LAB — BASIC METABOLIC PANEL WITH GFR
Anion gap: 9 (ref 5–15)
BUN: 61 mg/dL — ABNORMAL HIGH (ref 8–23)
CO2: 27 mmol/L (ref 22–32)
Calcium: 8.2 mg/dL — ABNORMAL LOW (ref 8.9–10.3)
Chloride: 115 mmol/L — ABNORMAL HIGH (ref 98–111)
Creatinine, Ser: 0.78 mg/dL (ref 0.61–1.24)
GFR, Estimated: >60 mL/min (ref >60)
Glucose, Bld: 139 mg/dL — ABNORMAL HIGH (ref 70–99)
Potassium: 3.7 mmol/L (ref 3.5–5.1)
Sodium: 151 mmol/L — ABNORMAL HIGH (ref 135–145)

## 2024-04-01 LAB — COOXEMETRY PANEL
Carboxyhemoglobin: 2.2 % — ABNORMAL HIGH (ref 0.5–1.5)
Methemoglobin: <0.7 % (ref 0.0–1.5)
O2 Saturation: 64.4 %
Total hemoglobin: 8.5 g/dL — ABNORMAL LOW (ref 12.0–16.0)

## 2024-04-01 LAB — GLUCOSE, CAPILLARY
Glucose-Capillary: 123 mg/dL — ABNORMAL HIGH (ref 70–99)
Glucose-Capillary: 127 mg/dL — ABNORMAL HIGH (ref 70–99)
Glucose-Capillary: 121 mg/dL — ABNORMAL HIGH (ref 70–99)

## 2024-04-01 LAB — CYTOLOGY - NON PAP

## 2024-04-01 MED ORDER — PROSOURCE TF20 ENFIT COMPATIBL EN LIQD: 60.0000 mL | Freq: Three times a day (TID) | ENTERAL | Status: DC

## 2024-04-01 MED ORDER — FREE WATER
300.0000 mL | Status: DC
  Administered 2024-04-01 (×2): 300 mL

## 2024-04-01 MED ORDER — POTASSIUM CHLORIDE 20 MEQ PO PACK
20.0000 meq | PACK | ORAL | Status: DC
  Administered 2024-04-01 (×2): 20 meq
  Filled 2024-04-01 (×2): qty 1

## 2024-04-01 MED ORDER — LOPERAMIDE HCL 1 MG/7.5ML PO SUSP: 2.0000 mg | ORAL | Status: DC | PRN

## 2024-04-01 MED ORDER — SODIUM CHLORIDE 0.9% FLUSH: 10.0000 mL | Freq: Two times a day (BID) | INTRAVENOUS | Status: DC

## 2024-04-01 MED ORDER — ASPIRIN 325 MG PO TBEC: 325.0000 mg | DELAYED_RELEASE_TABLET | Freq: Every day | ORAL | Status: DC

## 2024-04-01 MED ORDER — LINEZOLID 600 MG/300ML IV SOLN: 600.0000 mg | Freq: Two times a day (BID) | INTRAVENOUS | Status: DC

## 2024-04-01 MED ORDER — CHLORHEXIDINE GLUCONATE CLOTH 2 % EX PADS: 6.0000 | MEDICATED_PAD | Freq: Every day | CUTANEOUS | Status: DC

## 2024-04-01 MED ORDER — INSULIN GLARGINE 100 UNIT/ML ~~LOC~~ SOLN: 5.0000 [IU] | Freq: Every day | SUBCUTANEOUS | Status: DC

## 2024-04-01 MED ORDER — ONDANSETRON HCL 4 MG/2ML IJ SOLN: 4.0000 mg | Freq: Four times a day (QID) | INTRAMUSCULAR | Status: DC | PRN

## 2024-04-01 MED ORDER — VITAL 1.5 CAL PO LIQD: 1000.0000 mL | ORAL | Status: DC

## 2024-04-01 MED ORDER — ACETAMINOPHEN 160 MG/5ML PO SOLN: 650.0000 mg | Freq: Four times a day (QID) | ORAL | 0 refills | Status: DC

## 2024-04-01 MED ORDER — SODIUM CHLORIDE 0.9% FLUSH: 10.0000 mL | Freq: Three times a day (TID) | INTRAVENOUS | Status: DC

## 2024-04-01 MED ORDER — GERHARDT'S BUTT CREAM: 1.0000 | TOPICAL_CREAM | Freq: Every day | CUTANEOUS | Status: DC | PRN

## 2024-04-01 MED ORDER — BISACODYL 5 MG PO TBEC: 10.0000 mg | DELAYED_RELEASE_TABLET | Freq: Every day | ORAL | Status: DC | PRN

## 2024-04-01 MED ORDER — IPRATROPIUM-ALBUTEROL 0.5-2.5 (3) MG/3ML IN SOLN: 3.0000 mL | RESPIRATORY_TRACT | Status: DC | PRN

## 2024-04-01 MED ORDER — AMIODARONE HCL 200 MG PO TABS: 200.0000 mg | ORAL_TABLET | Freq: Every day | ORAL | Status: DC

## 2024-04-01 MED ORDER — HYDRALAZINE HCL 20 MG/ML IJ SOLN: 10.0000 mg | INTRAMUSCULAR | Status: DC | PRN

## 2024-04-01 MED ORDER — INSULIN ASPART 100 UNIT/ML IJ SOLN: 0.0000 [IU] | INTRAMUSCULAR | Status: DC

## 2024-04-01 MED ORDER — FAMOTIDINE 20 MG PO TABS: 20.0000 mg | ORAL_TABLET | Freq: Two times a day (BID) | ORAL | Status: DC

## 2024-04-01 MED ORDER — HYDRALAZINE HCL 10 MG PO TABS: 10.0000 mg | ORAL_TABLET | Freq: Three times a day (TID) | ORAL | Status: DC

## 2024-04-01 MED ORDER — SODIUM CHLORIDE 0.9% FLUSH: 3.0000 mL | Freq: Two times a day (BID) | INTRAVENOUS | Status: DC

## 2024-04-01 MED ORDER — ORAL CARE MOUTH RINSE: 15.0000 mL | OROMUCOSAL | Status: DC | PRN

## 2024-04-01 MED ORDER — ASPIRIN 81 MG PO CHEW: 324.0000 mg | CHEWABLE_TABLET | Freq: Every day | ORAL | Status: DC

## 2024-04-01 MED ORDER — ATORVASTATIN CALCIUM 80 MG PO TABS: 80.0000 mg | ORAL_TABLET | Freq: Every day | ORAL | Status: DC

## 2024-04-01 MED ORDER — POLYETHYLENE GLYCOL 3350 17 G PO PACK: 17.0000 g | PACK | Freq: Every day | ORAL | Status: DC | PRN

## 2024-04-01 MED ORDER — POTASSIUM CHLORIDE 20 MEQ PO PACK: 20.0000 meq | PACK | ORAL | Status: DC

## 2024-04-01 MED ORDER — BANATROL TF EN LIQD: 60.0000 mL | Freq: Four times a day (QID) | ENTERAL | Status: DC

## 2024-04-01 MED ORDER — SPIRONOLACTONE 25 MG PO TABS: 25.0000 mg | ORAL_TABLET | Freq: Every day | ORAL | Status: DC

## 2024-04-01 MED ORDER — HEPARIN SODIUM (PORCINE) 5000 UNIT/ML IJ SOLN: 5000.0000 [IU] | Freq: Three times a day (TID) | INTRAMUSCULAR | Status: DC

## 2024-04-01 MED ORDER — LEVETIRACETAM (KEPPRA) 500 MG/5 ML ADULT IV PUSH: 1000.0000 mg | Freq: Two times a day (BID) | INTRAVENOUS | Status: DC

## 2024-04-01 MED ORDER — BISACODYL 10 MG RE SUPP: 10.0000 mg | Freq: Every day | RECTAL | Status: DC | PRN

## 2024-04-01 MED ORDER — ORAL CARE MOUTH RINSE: 15.0000 mL | OROMUCOSAL | Status: DC

## 2024-04-01 MED ORDER — FENTANYL CITRATE PF 50 MCG/ML IJ SOSY: 25.0000 ug | PREFILLED_SYRINGE | INTRAMUSCULAR | Status: DC | PRN

## 2024-04-01 MED ORDER — OXYCODONE HCL 5 MG PO TABS: 5.0000 mg | ORAL_TABLET | ORAL | Status: DC | PRN

## 2024-04-01 MED ORDER — ACETAMINOPHEN 325 MG PO TABS: 650.0000 mg | ORAL_TABLET | Freq: Four times a day (QID) | ORAL | Status: DC

## 2024-04-01 MED ORDER — FREE WATER: 300.0000 mL | Status: DC

## 2024-04-01 NOTE — Progress Notes (Signed)
 Pt unable to void x 4 hrs with make purwick in place.  Dr Gretta aware.  Order received to in out cath pt with 650 ml output

## 2024-04-01 NOTE — TOC Transition Note (Signed)
 Transition of Care [facility]) - Discharge Note   Patient Details  Name: Alan [name] MRN: [id number] Date of Birth: 01-07-1956  Transition of Care [facility]) CM/SW Contact:  Justina Delcia Czar, RN Phone Number: [phone] 04/01/2024, 11:35 AM   Clinical Narrative:    Scheduled dc to [facility]. Room number is 5E28, accepting MD is Dr. Fleeta Finger. MD to MD report can be called to [phone]. RN to RN report can be called to the same number.    Final next level of care: Long Term Acute Care (LTAC) Barriers to Discharge: No Barriers Identified   Patient Goals and CMS Choice Patient states their goals for this hospitalization and ongoing recovery are:: plan to return home once stable          Discharge Placement      Discharge Plan and Services Additional resources added to the After Visit Summary for   In-house Referral: NA Discharge Planning Services: CM Consult Post Acute Care Choice: Home Health            DME Agency: NA                  Social Drivers of Health (SDOH) Interventions SDOH Screenings   Food Insecurity: No Food Insecurity (03/11/2024)  Housing: Low Risk  (03/11/2024)  Transportation Needs: No Transportation Needs (03/11/2024)  Utilities: Not At Risk (03/11/2024)  Depression (PHQ2-9): Low Risk  (10/05/2022)  Social Connections: Socially Integrated (03/10/2024)  Tobacco Use: Low Risk  (03/17/2024)     Readmission Risk Interventions     No data to display

## 2024-04-01 NOTE — Progress Notes (Signed)
 RT NOTE: patient placed on CPAP/PSV of 12/5 at 0845.  Tolerating well at this time.  Will continue to monitor and wean as tolerated.

## 2024-04-01 NOTE — Progress Notes (Signed)
 Wife updated over the phone.   Leita SHAUNNA Gaskins, DO 04/01/24 1:27 PM  [facility]  For contact information, see Amion. If no response to pager, please call PCCM consult pager. After hours, 7PM- 7AM, please call Elink.

## 2024-04-01 NOTE — Progress Notes (Addendum)
 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  Admitted 7/28 w/ chest pain. Initially started 7/26 went to ER and CEs and EKG neg so went home but CP persisted so returned.  In ER on 28th + trop I, new T wave inversion in anterior lateral leads.  Treated w/ supplemental oxygen, morphine , started on heparin  gtt.  Went to cath lab  Findings:  99% -80% in-stent restenosis of LAD balloon angioplasty completed w/ partial restoration of flow (this re-stenosed stent was felt culprit lesion). RCA 45 to 70% stenosed, cx was 40%. Had nml LVEDP, no AS, referred to CVTS for CABG given 3V disease. and placed on plavix . EF from ECHO 30-35% w/ left regional wall motion abnormality.   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 Amiodarone  bolus and gtt initiated.  Got 2 gms calcium  another 2 units PLTs. TXA  Pertinent  Medical History  Prior CAD w/ DES X 2 and prior minimally invasive MVR 2015, NSCLC q/ mets to bone (2023) s/p chemo and XRT w/ most recent scans showing stable non-active disease  NSTEMI, HLD  Significant Hospital Events: Including procedures, antibiotic start and stop dates in addition to other pertinent events   7/28 admitted w/ CP and acute inf/lat MI, cardiac cath svr 3V disease w/ re-stenosed LAD stent  and incomplete restoration of coronary flow. EF 30-35% referred for CABG 8/4 to OR. CABG x 3 v but as completing CABG noted blood loss and intra-op ECHO showed dissection of Aorta. So underwent AV graft placement and AVR. Large EBL in OR received several blood products. Immediate post op w/ large vol CT output, runs of VT and SVT. Got 2gms calcium , amiodarone  and placed on overdrive pacing via pacer critical care at bedside assisting w/ support  8/5 myoclonus; normal head CT& CTA, EEG started. Loaded with keppra .  8/6 remained on EEG, weaned off propofol  8/7 woke up, followed commands 8/8 started pneumonia antibiotics 8/9 failed SBT; milrinone  discontinued 8/10 extubated 8/11 required reintubation, bronch 8/12 R pigtail 8/14 extubation, failed> trach  Interim History / Subjective:  Overnight no acute events.   Objective    Blood pressure (!) 123/58, pulse 95, temperature 98.6 F (37 C), temperature source Oral, resp. rate 20, height 6' 4 (1.93 m), weight 72.9 kg, SpO2 100%. CVP:  [0 mmHg-7 mmHg] 0 mmHg  Vent Mode: PRVC FiO2 (%):  [40 %] 40 % Set Rate:  [20 bmp] 20 bmp Vt Set:  [620 mL] 620 mL PEEP:  [5 cmH20] 5 cmH20 Plateau Pressure:  [15 cmH20-18 cmH20] 17 cmH20   Intake/Output Summary (Last 24 hours) at 04/01/2024 0852 Last data filed at 04/01/2024 0800 Gross per 24 hour  Intake 3790 ml  Output 2840  ml  Net 950 ml   Filed Weights   03/30/24 0149 03/31/24 0500 04/01/24 0500  Weight: 67 kg 69 kg 72.9 kg    Examination: Critically ill appearing man lying in bed in NAD Miner/AT, eyes anicteric Trach in place without erythema S1S2, RRR Abd soft, NT Cyanotic mottled toes bilaterally, L>R, similar today. No LE extremities.  Opens eyes to verbal stimulation, not following commands.   Coox 64% Na+ 151 BUN 61 Cr 0.78 WBC 10.1 H/H 7.4/24.5 Platelets 183 8/14 Trach aspirate culture: staph epi, R quinolones, clinda, Trim/Sulfa 8/16 resp culture NGTD   Resolved problem  list  Post operative acute blood loss w/ consumptive coagulopathy & thrombocytopenia following prolonged CPB time  right hemothorax> resolved Post-op VT; not recurrent Prolonged Qtc- on 8/9 Thrombocytopenia- improving H/o HLD, HLD and CAD w/ severe mitral valve disease. Prior stents and mini- mitral valve repair (2015) for severe MR. Acute hypoxic respiratory failure with H.flu PNA - failed extubation 8/10, reintubated 8/11. Acute on chronic HFrEF due to ICM with cardiogenic shock improved but volume up Bilateral PTX- look better on CXR 8/13, both pleural spaces with tubes in place Assessment and Plan   3V CAD and restenosis of DES now s/p CABG x 3 V w/ intraoperative finding of ascending aorta dissection requiring aortic graft and AVR Post-op cardiogenic shock resolved -post-op care per TCTS -aspirin , statin, spironolactone  -con't holding metoprolol  -hydralazine  for BP control  Acute resp failure- some combination aspiration and HCAP  R pleural effusion s/p chest tube; >400cc out yesterday -CXR; still needs chest tube -LTVV -con't vent weaning trials daily; tolerated TCT on 8/17 for about 3 hrs -VAP prevention protocol -trach care per protocol -complete 7 days of linezolid ; con't monitoring fever curve  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  Looked old.  XLT placed 8/14 and bypasses this defect. Will leave alone for now -later if he is likely to be decannulated likely needs a bronch to reassess  H/o RLL stage IV lung adenocarcinoma w/ bone mets. Last PET w/ no active disease- residual friable tissue that bleeds with any suctioning (ie during bronch); sent cyto R effusion 8/16 -CT not concerning for new spinal mets; redemonstrated old bony mets on CT  Fevers- antifungal coverage added 8/15> better Staph epi pneumonia -complete 7 days of  linezolid ; monitor off antifungals and GNR coverage  Hypernatremia, improving -increase FWF to q2h -monitor  Small embolic strokes, Twitching L>R 8/13- repeat EEG and CT reassuring Muscular deconditioning POA -PT, OT -supportive care  Atheroembolic extremity mottling -monitor  R groin AV fistula -can be addressed at later time if/when he recovers  Hypertension -con't hydralzine 10mg  q8h  Moderate protein energy malnutrition -con't TF  Wife updated yesterday by phone. She is ready for him to go to [facility] when he is medically stable. Post-operatively he is stable when they have a bed available.   This patient is critically ill with multiple organ system failure which requires frequent high complexity decision making, assessment, support, evaluation, and titration of therapies. This was completed through the application of advanced monitoring technologies and extensive interpretation of multiple databases. During this encounter critical care time was devoted to patient care services described in this note for  41 minutes.  Alan SHAUNNA Gaskins, DO 04/01/24 10:03 AM [name]  For contact information, see Amion. If no response to pager,  please call PCCM consult pager. After hours, 7PM- 7AM, please call Elink.

## 2024-04-01 NOTE — Progress Notes (Signed)
 CARDIAC REHAB PHASE I    CRP2 referral sent to [facility] per protocol. Unclear if or when pt will be able to attend given guarded prognosis. Planned discharge to [facility] for vent wean.   Vaughn Asberry Hacking, RN BSN 04/01/2024 10:56 AM

## 2024-04-01 NOTE — Progress Notes (Addendum)
 TCTS DAILY ICU PROGRESS NOTE                   [address]            [place] [address]          [phone]   15 Days Post-Op Procedure(s) (LRB): CORONARY ARTERY BYPASS GRAFTING TIMES THREE , USING LEFT INTERNAL MAMMARY ARTERY AND ENDOSCOPIC HARVESTED RIGHT SAPHENOUS VEIN (N/A) ECHOCARDIOGRAM, TRANSESOPHAGEAL, INTRAOPERATIVE (N/A) REPAIR, AORTIC DISSECTION, ASCENDING USING 30 MM HEMASHIELD PLATINUM VASCULAR GRAFT RESUSPENSION OF AORTIC VALVE  Total Length of Stay:  LOS: 22 days   Subjective: Not responsive to verbal stimuli  Objective: Vital signs in last 24 hours: Temp:  [97.5 F (36.4 C)-98.6 F (37 C)] 98.6 F (37 C) (08/19 0813) Pulse Rate:  [88-106] 95 (08/19 0813) Cardiac Rhythm: Normal sinus rhythm (08/19 0400) Resp:  [11-28] 20 (08/19 0813) BP: (116-147)/(49-66) 123/58 (08/19 0800) SpO2:  [95 %-100 %] 100 % (08/19 0813) FiO2 (%):  [40 %] 40 % (08/19 0356) Weight:  [72.9 kg] 72.9 kg (08/19 0500)  Filed Weights   03/30/24 0149 03/31/24 0500 04/01/24 0500  Weight: 67 kg 69 kg 72.9 kg    Weight change: 3.9 kg   Hemodynamic parameters for last 24 hours: CVP:  [0 mmHg-7 mmHg] 0 mmHg  Intake/Output from previous day: 08/18 0701 - 08/19 0700 In: 3850 [I.V.:40; NG/GT:3180; IV Piggyback:620] Out: 2820 [Urine:2300; Chest Tube:520]  Intake/Output this shift: No intake/output data recorded.  Current Meds: Scheduled Meds:  acetaminophen  (TYLENOL ) oral liquid 160 mg/5 mL  650 mg Per Tube Q6H   Or   acetaminophen   650 mg Per Tube Q6H   amiodarone   200 mg Per Tube Daily   aspirin  EC  325 mg Oral Daily   Or   aspirin   324 mg Per Tube Daily   atorvastatin   80 mg Per Tube Daily   Chlorhexidine  Gluconate Cloth  6 each Topical Daily   famotidine   20 mg Per Tube BID   feeding supplement (PROSource TF20)  60 mL Per Tube TID   fiber supplement (BANATROL TF)  60 mL Per Tube QID   free water   300 mL Per Tube Q2H   heparin  injection (subcutaneous)   5,000 Units Subcutaneous Q8H   hydrALAZINE   10 mg Per Tube Q8H   insulin  aspart  0-24 Units Subcutaneous Q4H   insulin  glargine  5 Units Subcutaneous Daily   levETIRAcetam   1,000 mg Intravenous Q12H   mouth rinse  15 mL Mouth Rinse Q2H   potassium chloride   20 mEq Per Tube Q4H   sodium chloride  flush  10 mL Intrapleural Q8H   sodium chloride  flush  10-40 mL Intracatheter Q12H   sodium chloride  flush  3 mL Intravenous Q12H   spironolactone   25 mg Per Tube Daily   Continuous Infusions:  feeding supplement (VITAL 1.5 CAL) 60 mL/hr at 04/01/24 0600   linezolid  (ZYVOX ) IV Stopped (03/31/24 2229)   PRN Meds:.bisacodyl  **OR** bisacodyl , fentaNYL  (SUBLIMAZE ) injection, Gerhardt's butt cream, hydrALAZINE , ipratropium-albuterol , loperamide  HCl, ondansetron  (ZOFRAN ) IV, mouth rinse, oxyCODONE , polyethylene glycol, sodium chloride  flush  Neurologic: not responding to verbal stimuli Lungs_ coarse BS Cor- RRR Abd- soft, non distende Incis-  stable Ext- mottling BLE feet  Lab Results: CBC: Recent Labs    03/31/24 0454 04/01/24 0503  WBC 11.9* 10.1  HGB 7.9* 7.4*  HCT 26.8* 24.5*  PLT 199 183   BMET:  Recent Labs    03/31/24 0454 04/01/24 0503  NA 150* 151*  K 3.7 3.7  CL 113* 115*  CO2 29 27  GLUCOSE 144* 139*  BUN 60* 61*  CREATININE 0.82 0.78  CALCIUM  8.0* 8.2*    CMET: Lab Results  Component Value Date   WBC 10.1 04/01/2024   HGB 7.4 (L) 04/01/2024   HCT 24.5 (L) 04/01/2024   PLT 183 04/01/2024   GLUCOSE 139 (H) 04/01/2024   CHOL 120 03/12/2024   TRIG 61 03/28/2024   HDL 40 (L) 03/12/2024   LDLCALC 58 03/12/2024   ALT 361 (H) 03/19/2024   AST 243 (H) 03/19/2024   NA 151 (H) 04/01/2024   K 3.7 04/01/2024   CL 115 (H) 04/01/2024   CREATININE 0.78 04/01/2024   BUN 61 (H) 04/01/2024   CO2 27 04/01/2024   TSH 1.928 08/19/2020   INR 1.5 (H) 03/19/2024   HGBA1C 5.0 03/12/2024      PT/INR: No results for input(s): LABPROT, INR in the last 72  hours. Radiology: DG CHEST PORT 1 VIEW Result Date: 03/31/2024 CLINICAL DATA:  Pleural effusion. EXAM: PORTABLE CHEST 1 VIEW COMPARISON:  Radiograph 03/29/2024, CT 03/26/2024 FINDINGS: Tracheostomy tube remains in place. Weighted enteric tube remains in place. Left upper extremity PICC tip overlies the SVC. Right basilar pigtail catheter coiled at the lung base. Stable right basilar opacity which may represent pleural fluid and/or airspace disease/atelectasis. No convincing pneumothorax. Medial left lung base opacity is similar. Prior median sternotomy with stable heart size and mediastinal contours. Small left pleural effusion. IMPRESSION: 1. Stable right basilar opacity which may represent pleural fluid and/or airspace disease/atelectasis. Right basilar pigtail catheter in place. No convincing pneumothorax. 2. Stable medial left lung base opacity. Small left pleural effusion. Electronically Signed   By: Andrea Gasman M.D.   On: 03/31/2024 12:58     Assessment/Plan: S/P Procedure(s) (LRB): CORONARY ARTERY BYPASS GRAFTING TIMES THREE , USING LEFT INTERNAL MAMMARY ARTERY AND ENDOSCOPIC HARVESTED RIGHT SAPHENOUS VEIN (N/A) ECHOCARDIOGRAM, TRANSESOPHAGEAL, INTRAOPERATIVE (N/A) REPAIR, AORTIC DISSECTION, ASCENDING USING 30 MM HEMASHIELD PLATINUM VASCULAR GRAFT RESUSPENSION OF AORTIC VALVE  1 afeb, VSS, NSR, Co-Ox 64 2 remains on vent- PS/CPAP- as per PCCM 3 hypernatremia remains fairly stable- getting free water   4 creat normal - BUN remains elevated but pretty stable 5 H/H - slow decline, multifactorial 6  leukocytosis resolved, conts current abx, antifungals stopped yesterday 7 CBG's - adeq control 8 conts CT while on vent 9 right groin AV- fistula and LE mottling remain an issue with conservative watchful waiting for now 10 conts TF's 11 metastatic lung CA- cytology positive for aden on recent sample 12  CVA and resultant marked deconditioning/weakness 13 guarded prognosis    Lemond FORBES Cera PA-C Pager [phone] 04/01/2024 8:28 AM\  No major changes Dr. Kerrin is ok with transfer to [facility] for continued vent wean Continue supportive care  Zelig Gacek O Defne Gerling

## 2024-04-01 NOTE — Progress Notes (Signed)
 Report called to Dr. Fleeta Finger at [facility].  Leita SHAUNNA Gaskins, DO 04/01/24 11:51 AM [facility]  For contact information, see Amion. If no response to pager, please call PCCM consult pager. After hours, 7PM- 7AM, please call Elink.

## 2024-04-02 DIAGNOSIS — J90 Pleural effusion, not elsewhere classified: Secondary | ICD-10-CM

## 2024-04-02 DIAGNOSIS — Y95 Nosocomial condition: Secondary | ICD-10-CM

## 2024-04-02 DIAGNOSIS — I5020 Unspecified systolic (congestive) heart failure: Secondary | ICD-10-CM

## 2024-04-02 DIAGNOSIS — Z930 Tracheostomy status: Secondary | ICD-10-CM

## 2024-04-02 DIAGNOSIS — I4821 Permanent atrial fibrillation: Secondary | ICD-10-CM

## 2024-04-02 DIAGNOSIS — J189 Pneumonia, unspecified organism: Secondary | ICD-10-CM

## 2024-04-02 DIAGNOSIS — J9621 Acute and chronic respiratory failure with hypoxia: Secondary | ICD-10-CM

## 2024-04-02 LAB — COMPREHENSIVE METABOLIC PANEL WITH GFR
ALT: 37 U/L (ref 0–44)
AST: 42 U/L — ABNORMAL HIGH (ref 15–41)
Albumin: 2 g/dL — ABNORMAL LOW (ref 3.5–5.0)
Alkaline Phosphatase: 123 U/L (ref 38–126)
Anion gap: 12 (ref 5–15)
BUN: 55 mg/dL — ABNORMAL HIGH (ref 8–23)
CO2: 25 mmol/L (ref 22–32)
Calcium: 8.1 mg/dL — ABNORMAL LOW (ref 8.9–10.3)
Chloride: 108 mmol/L (ref 98–111)
Creatinine, Ser: 0.82 mg/dL (ref 0.61–1.24)
GFR, Estimated: >60 mL/min (ref >60)
Glucose, Bld: 129 mg/dL — ABNORMAL HIGH (ref 70–99)
Potassium: 4.1 mmol/L (ref 3.5–5.1)
Sodium: 145 mmol/L (ref 135–145)
Total Bilirubin: 0.6 mg/dL (ref 0.0–1.2)
Total Protein: 5 g/dL — ABNORMAL LOW (ref 6.5–8.1)

## 2024-04-02 LAB — CBC WITH DIFFERENTIAL/PLATELET
Abs Immature Granulocytes: 0.09 K/uL — ABNORMAL HIGH (ref 0.00–0.07)
Basophils Absolute: 0 K/uL (ref 0.0–0.1)
Basophils Relative: 0 %
Eosinophils Absolute: 0.2 K/uL (ref 0.0–0.5)
Eosinophils Relative: 2 %
HCT: 24.8 % — ABNORMAL LOW (ref 39.0–52.0)
Hemoglobin: 7.7 g/dL — ABNORMAL LOW (ref 13.0–17.0)
Immature Granulocytes: 1 %
Lymphocytes Relative: 3 %
Lymphs Abs: 0.3 K/uL — ABNORMAL LOW (ref 0.7–4.0)
MCH: 30.7 pg (ref 26.0–34.0)
MCHC: 31 g/dL (ref 30.0–36.0)
MCV: 98.8 fL (ref 80.0–100.0)
Monocytes Absolute: 0.9 K/uL (ref 0.1–1.0)
Monocytes Relative: 8 %
Neutro Abs: 9.5 K/uL — ABNORMAL HIGH (ref 1.7–7.7)
Neutrophils Relative %: 86 %
Platelets: 201 K/uL (ref 150–400)
RBC: 2.51 MIL/uL — ABNORMAL LOW (ref 4.22–5.81)
RDW: 17.7 % — ABNORMAL HIGH (ref 11.5–15.5)
WBC: 11 K/uL — ABNORMAL HIGH (ref 4.0–10.5)
nRBC: 0.4 % — ABNORMAL HIGH (ref 0.0–0.2)

## 2024-04-02 LAB — TSH: TSH: 6.984 u[IU]/mL — ABNORMAL HIGH (ref 0.350–4.500)

## 2024-04-02 LAB — HEMOGLOBIN A1C
Hgb A1c MFr Bld: 5.4 % (ref 4.8–5.6)
Mean Plasma Glucose: 108.28 mg/dL

## 2024-04-03 DIAGNOSIS — I5020 Unspecified systolic (congestive) heart failure: Secondary | ICD-10-CM

## 2024-04-03 DIAGNOSIS — J9621 Acute and chronic respiratory failure with hypoxia: Secondary | ICD-10-CM

## 2024-04-03 DIAGNOSIS — Z930 Tracheostomy status: Secondary | ICD-10-CM

## 2024-04-03 DIAGNOSIS — J90 Pleural effusion, not elsewhere classified: Secondary | ICD-10-CM

## 2024-04-03 DIAGNOSIS — I4821 Permanent atrial fibrillation: Secondary | ICD-10-CM

## 2024-04-03 DIAGNOSIS — Y95 Nosocomial condition: Secondary | ICD-10-CM

## 2024-04-03 DIAGNOSIS — J189 Pneumonia, unspecified organism: Secondary | ICD-10-CM

## 2024-04-03 LAB — T4, FREE: Free T4: 0.99 ng/dL (ref 0.61–1.12)

## 2024-04-03 LAB — TSH: TSH: 6.788 u[IU]/mL — ABNORMAL HIGH (ref 0.350–4.500)

## 2024-04-03 NOTE — Unmapped External Note (Signed)
  Problem: Potential for Compromised Skin Integrity Goal: Skin integrity is maintained or improved Outcome: Progressing Goal: Nutritional status is improving Outcome: Progressing   Problem: Urinary Incontinence Goal: Perineal skin integrity is maintained or improved Outcome: Progressing   Problem: Bowel Incontinence Goal: Perineal Skin Integrity is Maintained or Improved Outcome: Progressing   Problem: Pain Goal: Patient's Pain/Discomfort is Manageable Outcome: Progressing   Problem: Knowledge Deficit Goal: Patient and/or family demonstrate readiness to learn Outcome: Progressing Goal: Patient and/or family verbalizes understanding of condition and treatment, education, and/or performs desired skill Outcome: Progressing   Problem: Risk for/Impaired Spontaneous Ventilation; Risk for Ineffective Breathing Pattern Goal: Patient will maintain adequate air exchange; Patient will maintain or improve breathing pattern Outcome: Progressing Goal: Patient will maintain clear, open airways Outcome: Progressing   Problem: Risk for / Ineffective Airway Clearance Goal: Patient will maintain clear, open airways Outcome: Progressing   Problem: Risk for Aspiration Goal: Patient is free of signs of aspiration and the risk of aspiration is decreased Outcome: Progressing   Problem: Risk for / Altered Hemodynamic Status Goal: Patient has stable or improving hemodynamic status Outcome: Progressing   Problem: Risk for / Impaired Skin Integrity Goal: Skin integrity is maintained or improved Outcome: Progressing   Problem: Risk for / Impaired Mobility (Activity Intolerance) Goal: Mobility/activity is maintained at optimum level for patient Outcome: Progressing   Problem: Risk for / Imbalanced Nutrition Goal: Maintains adequate nutritional intake Outcome: Progressing   Problem: Risk for / Impaired Communication Goal: Establish communication to meet the patient/caregiver needs Outcome:  Progressing   Problem: Risk for / Impaired Cognition Goal: Patient has stable or improving cognition Outcome: Progressing   Problem: Risk for Infection Goal: Absence of infection and prevention of transmission during hospitalization Outcome: Progressing   Problem: Risk for/Anxiety Goal: Demonstrates ability to cope effectively Outcome: Progressing   Problem: Risk for/ Activity Intolerance Goal: Mobility/activity is maintained at optimum level for patient Outcome: Progressing   Problem: Risk for/ Decreased CO Goal: Patient has stable or improving hemodynamic status Outcome: Progressing   Problem: Ineffective Tissue Perfusion Goal: Patient will have stable or improving tissue perfusion Outcome: Progressing   Problem: Pain-Cardiac specific Goal: Report anginal episodes decreased in frequency, duration, and severity Outcome: Progressing   Problem: Risk for impaired gas exchange Goal: Patient will maintain adequate gas exchange Outcome: Progressing   Problem: Risk for fluid/electrolyte imbalance: Dehydration Goal: Fluid and electrolyte balance are achieved/maintained Outcome: Progressing   Problem: Risk for fluid/electrolyte imbalance: Overload Goal: Fluid and electrolyte balance are achieved/maintained Outcome: Progressing   Problem: Infection Goal: Absence of infection and prevention of transmission during hospitalization Outcome: Progressing   Problem: Knowledge Deficit Goal: Patient and/or family demonstrate readiness to learn Outcome: Progressing Goal: Patient and/or family verbalizes understanding of education, and/or performs desired skill Outcome: Progressing   Problem: Discharge Planning Goal: Discharge to home or other facility with appropriate resources Outcome: Progressing Goal: Care giver will develop a plan to decrease their burden and enhance comfort in role Outcome: Progressing   Problem: Delirium Goal: Prevent and Manage Delirium Outcome:  Progressing   Problem: Fall Safety: Universal Precautions Goal: Free from fall injury Outcome: Progressing Goal: Toilet Magnet Status ([facility] Only) Outcome: Progressing   Problem: Fall Prevention: Medication Bundle Goal: Patient will be free from Fall Injury Outcome: Progressing   Problem: Fall Prevention: Continence Bundle Goal: Patient will be free from Fall Injury Outcome: Progressing

## 2024-04-04 ENCOUNTER — Ambulatory Visit: Admitting: Emergency Medicine

## 2024-04-04 DIAGNOSIS — Y95 Nosocomial condition: Secondary | ICD-10-CM

## 2024-04-04 DIAGNOSIS — I4821 Permanent atrial fibrillation: Secondary | ICD-10-CM

## 2024-04-04 DIAGNOSIS — J189 Pneumonia, unspecified organism: Secondary | ICD-10-CM

## 2024-04-04 DIAGNOSIS — J90 Pleural effusion, not elsewhere classified: Secondary | ICD-10-CM

## 2024-04-04 DIAGNOSIS — J9621 Acute and chronic respiratory failure with hypoxia: Secondary | ICD-10-CM

## 2024-04-04 DIAGNOSIS — I5020 Unspecified systolic (congestive) heart failure: Secondary | ICD-10-CM

## 2024-04-04 DIAGNOSIS — Z930 Tracheostomy status: Secondary | ICD-10-CM

## 2024-04-04 LAB — T3, FREE: T3, Free: 1.4 pg/mL — ABNORMAL LOW (ref 2.0–4.4)

## 2024-04-04 NOTE — Progress Notes (Signed)
 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 (Trach/ETT):Trach  Size:6  XLT-D  Cuffed/Cuffless?:Cuffed  Placement at lip?:  High Risk Airway?:  Date of insertion:03/27/2024  Trach changed:  PMV started:  Cap started:  Decannulated/extubated:   Ventilator ID Number: Current ventilator settings:ACVC 18,550,+5,28%  Current weaning orders:  Date of first wean:04/02/2024  Date of liberation from vent:   Non-invasive Ventilation Current settings:   Home use? Yes/no:  Frequency? HS/PRN/Continuous:   Oxygen Therapy Modality? Nasal Cannula/NRB/Trach Collar/etc.:  FiO2:  LPM:   Treatments Drug: Dose: Frequency: Other:                        RT Progress Date Time Important Event or Update RT Initials  04/01/2024 1524 ACVC, passed SBT. Chest tube on the right BP  04/01/2024 2239 Patient remained on full support AC/VC DS  04/02/2024 0559 ACVC, PSV =2hrs BP  04/03/2024 0042 Patient remained on full support AC/VC DS  04/03/24 1538 Completed 4 hrs PSV wean, goal tmr of 8 hrs.  Returned to [employer] mode. EA  04/04/2024 0431 Full support ventilation, AC/VC. SAP

## 2024-04-05 DIAGNOSIS — Z930 Tracheostomy status: Secondary | ICD-10-CM

## 2024-04-05 DIAGNOSIS — I4821 Permanent atrial fibrillation: Secondary | ICD-10-CM

## 2024-04-05 DIAGNOSIS — Y95 Nosocomial condition: Secondary | ICD-10-CM

## 2024-04-05 DIAGNOSIS — J189 Pneumonia, unspecified organism: Secondary | ICD-10-CM

## 2024-04-05 DIAGNOSIS — J9621 Acute and chronic respiratory failure with hypoxia: Secondary | ICD-10-CM

## 2024-04-05 DIAGNOSIS — I5020 Unspecified systolic (congestive) heart failure: Secondary | ICD-10-CM

## 2024-04-05 DIAGNOSIS — J90 Pleural effusion, not elsewhere classified: Secondary | ICD-10-CM

## 2024-04-06 DIAGNOSIS — I5020 Unspecified systolic (congestive) heart failure: Secondary | ICD-10-CM

## 2024-04-06 DIAGNOSIS — I4821 Permanent atrial fibrillation: Secondary | ICD-10-CM

## 2024-04-06 DIAGNOSIS — Z930 Tracheostomy status: Secondary | ICD-10-CM

## 2024-04-06 DIAGNOSIS — J189 Pneumonia, unspecified organism: Secondary | ICD-10-CM

## 2024-04-06 DIAGNOSIS — Y95 Nosocomial condition: Secondary | ICD-10-CM

## 2024-04-06 DIAGNOSIS — J90 Pleural effusion, not elsewhere classified: Secondary | ICD-10-CM

## 2024-04-06 DIAGNOSIS — J9621 Acute and chronic respiratory failure with hypoxia: Secondary | ICD-10-CM

## 2024-04-14 DEATH — deceased

## 2024-04-15 ENCOUNTER — Ambulatory Visit

## 2024-05-28 ENCOUNTER — Other Ambulatory Visit: Payer: Self-pay | Admitting: Cardiology

## 2024-09-22 ENCOUNTER — Other Ambulatory Visit

## 2024-09-29 ENCOUNTER — Ambulatory Visit: Admitting: Internal Medicine
